# Patient Record
Sex: Female | Born: 1970
Health system: Southern US, Community
[De-identification: ages and names within clinical notes are randomized; demographics above are authoritative.]

## PROBLEM LIST (undated history)

## (undated) DIAGNOSIS — G709 Myoneural disorder, unspecified: Secondary | ICD-10-CM

## (undated) DIAGNOSIS — E559 Vitamin D deficiency, unspecified: Secondary | ICD-10-CM

## (undated) DIAGNOSIS — N938 Other specified abnormal uterine and vaginal bleeding: Secondary | ICD-10-CM

## (undated) DIAGNOSIS — M1990 Unspecified osteoarthritis, unspecified site: Secondary | ICD-10-CM

## (undated) DIAGNOSIS — Z95818 Presence of other cardiac implants and grafts: Secondary | ICD-10-CM

## (undated) DIAGNOSIS — N9089 Other specified noninflammatory disorders of vulva and perineum: Secondary | ICD-10-CM

## (undated) DIAGNOSIS — I639 Cerebral infarction, unspecified: Secondary | ICD-10-CM

## (undated) DIAGNOSIS — R87629 Unspecified abnormal cytological findings in specimens from vagina: Secondary | ICD-10-CM

## (undated) DIAGNOSIS — E669 Obesity, unspecified: Secondary | ICD-10-CM

## (undated) DIAGNOSIS — B999 Unspecified infectious disease: Secondary | ICD-10-CM

## (undated) DIAGNOSIS — R55 Syncope and collapse: Secondary | ICD-10-CM

## (undated) DIAGNOSIS — G4730 Sleep apnea, unspecified: Secondary | ICD-10-CM

## (undated) DIAGNOSIS — T8859XA Other complications of anesthesia, initial encounter: Secondary | ICD-10-CM

## (undated) DIAGNOSIS — N644 Mastodynia: Secondary | ICD-10-CM

## (undated) DIAGNOSIS — N930 Postcoital and contact bleeding: Secondary | ICD-10-CM

## (undated) DIAGNOSIS — E119 Type 2 diabetes mellitus without complications: Secondary | ICD-10-CM

## (undated) DIAGNOSIS — F329 Major depressive disorder, single episode, unspecified: Secondary | ICD-10-CM

## (undated) DIAGNOSIS — G629 Polyneuropathy, unspecified: Secondary | ICD-10-CM

## (undated) DIAGNOSIS — R51 Headache: Secondary | ICD-10-CM

## (undated) DIAGNOSIS — E079 Disorder of thyroid, unspecified: Secondary | ICD-10-CM

## (undated) DIAGNOSIS — R569 Unspecified convulsions: Secondary | ICD-10-CM

## (undated) DIAGNOSIS — N760 Acute vaginitis: Secondary | ICD-10-CM

## (undated) DIAGNOSIS — B9689 Other specified bacterial agents as the cause of diseases classified elsewhere: Secondary | ICD-10-CM

## (undated) DIAGNOSIS — I10 Essential (primary) hypertension: Secondary | ICD-10-CM

## (undated) DIAGNOSIS — D649 Anemia, unspecified: Secondary | ICD-10-CM

## (undated) DIAGNOSIS — Z862 Personal history of diseases of the blood and blood-forming organs and certain disorders involving the immune mechanism: Secondary | ICD-10-CM

## (undated) DIAGNOSIS — J029 Acute pharyngitis, unspecified: Secondary | ICD-10-CM

## (undated) DIAGNOSIS — N915 Oligomenorrhea, unspecified: Secondary | ICD-10-CM

## (undated) DIAGNOSIS — I2699 Other pulmonary embolism without acute cor pulmonale: Secondary | ICD-10-CM

## (undated) DIAGNOSIS — T4145XA Adverse effect of unspecified anesthetic, initial encounter: Secondary | ICD-10-CM

## (undated) DIAGNOSIS — B009 Herpesviral infection, unspecified: Secondary | ICD-10-CM

## (undated) DIAGNOSIS — I82409 Acute embolism and thrombosis of unspecified deep veins of unspecified lower extremity: Secondary | ICD-10-CM

## (undated) DIAGNOSIS — I872 Venous insufficiency (chronic) (peripheral): Secondary | ICD-10-CM

## (undated) DIAGNOSIS — E785 Hyperlipidemia, unspecified: Secondary | ICD-10-CM

## (undated) HISTORY — DX: Sleep apnea, unspecified: G47.30

## (undated) HISTORY — DX: Syncope and collapse: R55

## (undated) HISTORY — PX: CHOLECYSTECTOMY: SHX55

## (undated) HISTORY — DX: Acute vaginitis: N76.0

## (undated) HISTORY — DX: Obesity, unspecified: E66.9

## (undated) HISTORY — DX: Vitamin D deficiency, unspecified: E55.9

## (undated) HISTORY — DX: Major depressive disorder, single episode, unspecified: F32.9

## (undated) HISTORY — DX: Postcoital and contact bleeding: N93.0

## (undated) HISTORY — DX: Herpesviral infection, unspecified: B00.9

## (undated) HISTORY — DX: Polyneuropathy, unspecified: G62.9

## (undated) HISTORY — DX: Mastodynia: N64.4

## (undated) HISTORY — PX: DILATATION & CURRETTAGE/HYSTEROSCOPY WITH RESECTOCOPE: SHX5572

## (undated) HISTORY — DX: Type 2 diabetes mellitus without complications: E11.9

## (undated) HISTORY — DX: Other specified abnormal uterine and vaginal bleeding: N93.8

## (undated) HISTORY — DX: Oligomenorrhea, unspecified: N91.5

## (undated) HISTORY — DX: Other specified noninflammatory disorders of vulva and perineum: N90.89

## (undated) HISTORY — DX: Acute embolism and thrombosis of unspecified deep veins of unspecified lower extremity: I82.409

## (undated) HISTORY — DX: Other pulmonary embolism without acute cor pulmonale: I26.99

## (undated) HISTORY — DX: Disorder of thyroid, unspecified: E07.9

## (undated) HISTORY — PX: CARDIAC ELECTROPHYSIOLOGY STUDY & DFT: SHX1293

## (undated) HISTORY — DX: Other specified bacterial agents as the cause of diseases classified elsewhere: B96.89

## (undated) HISTORY — PX: WISDOM TOOTH EXTRACTION: SHX21

## (undated) HISTORY — DX: Venous insufficiency (chronic) (peripheral): I87.2

## (undated) HISTORY — PX: BREAST REDUCTION SURGERY: SHX8

---

## 1998-03-25 ENCOUNTER — Other Ambulatory Visit: Admission: RE | Admit: 1998-03-25 | Discharge: 1998-03-25 | Payer: Self-pay | Admitting: *Deleted

## 1998-03-25 ENCOUNTER — Encounter: Admission: RE | Admit: 1998-03-25 | Discharge: 1998-03-25 | Payer: Self-pay | Admitting: Obstetrics

## 1998-07-21 ENCOUNTER — Encounter: Admission: RE | Admit: 1998-07-21 | Discharge: 1998-07-21 | Payer: Self-pay | Admitting: Internal Medicine

## 2000-02-29 ENCOUNTER — Inpatient Hospital Stay (HOSPITAL_COMMUNITY): Admission: EM | Admit: 2000-02-29 | Discharge: 2000-02-29 | Payer: Self-pay | Admitting: Obstetrics

## 2000-05-17 ENCOUNTER — Encounter: Admission: RE | Admit: 2000-05-17 | Discharge: 2000-05-17 | Payer: Self-pay | Admitting: Obstetrics

## 2000-05-23 ENCOUNTER — Encounter: Admission: RE | Admit: 2000-05-23 | Discharge: 2000-05-23 | Payer: Self-pay | Admitting: Obstetrics

## 2000-05-23 ENCOUNTER — Encounter: Payer: Self-pay | Admitting: Obstetrics

## 2000-05-26 ENCOUNTER — Emergency Department (HOSPITAL_COMMUNITY): Admission: EM | Admit: 2000-05-26 | Discharge: 2000-05-26 | Payer: Self-pay | Admitting: Emergency Medicine

## 2000-05-28 ENCOUNTER — Emergency Department (HOSPITAL_COMMUNITY): Admission: EM | Admit: 2000-05-28 | Discharge: 2000-05-28 | Payer: Self-pay | Admitting: *Deleted

## 2000-06-14 ENCOUNTER — Encounter: Admission: RE | Admit: 2000-06-14 | Discharge: 2000-06-14 | Payer: Self-pay | Admitting: Obstetrics

## 2000-07-11 ENCOUNTER — Inpatient Hospital Stay (HOSPITAL_COMMUNITY): Admission: EM | Admit: 2000-07-11 | Discharge: 2000-07-11 | Payer: Self-pay | Admitting: Obstetrics

## 2000-11-08 ENCOUNTER — Encounter: Admission: RE | Admit: 2000-11-08 | Discharge: 2000-11-08 | Payer: Self-pay | Admitting: Obstetrics

## 2000-12-03 ENCOUNTER — Ambulatory Visit (HOSPITAL_COMMUNITY): Admission: RE | Admit: 2000-12-03 | Discharge: 2000-12-03 | Payer: Self-pay | Admitting: Obstetrics

## 2000-12-17 ENCOUNTER — Encounter (INDEPENDENT_AMBULATORY_CARE_PROVIDER_SITE_OTHER): Payer: Self-pay

## 2000-12-17 ENCOUNTER — Inpatient Hospital Stay (HOSPITAL_COMMUNITY): Admission: AD | Admit: 2000-12-17 | Discharge: 2000-12-17 | Payer: Self-pay | Admitting: Obstetrics

## 2001-01-10 ENCOUNTER — Encounter: Admission: RE | Admit: 2001-01-10 | Discharge: 2001-01-10 | Payer: Self-pay | Admitting: Obstetrics

## 2002-02-11 ENCOUNTER — Other Ambulatory Visit: Admission: RE | Admit: 2002-02-11 | Discharge: 2002-02-11 | Payer: Self-pay | Admitting: Obstetrics & Gynecology

## 2002-12-18 ENCOUNTER — Emergency Department (HOSPITAL_COMMUNITY): Admission: EM | Admit: 2002-12-18 | Discharge: 2002-12-18 | Payer: Self-pay | Admitting: Emergency Medicine

## 2003-08-19 ENCOUNTER — Emergency Department (HOSPITAL_COMMUNITY): Admission: EM | Admit: 2003-08-19 | Discharge: 2003-08-19 | Payer: Self-pay | Admitting: Emergency Medicine

## 2004-01-12 ENCOUNTER — Other Ambulatory Visit: Admission: RE | Admit: 2004-01-12 | Discharge: 2004-01-12 | Payer: Self-pay | Admitting: Obstetrics and Gynecology

## 2005-12-07 ENCOUNTER — Emergency Department (HOSPITAL_COMMUNITY): Admission: EM | Admit: 2005-12-07 | Discharge: 2005-12-08 | Payer: Self-pay | Admitting: Emergency Medicine

## 2005-12-15 ENCOUNTER — Emergency Department (HOSPITAL_COMMUNITY): Admission: EM | Admit: 2005-12-15 | Discharge: 2005-12-15 | Payer: Self-pay | Admitting: Emergency Medicine

## 2005-12-21 ENCOUNTER — Emergency Department (HOSPITAL_COMMUNITY): Admission: EM | Admit: 2005-12-21 | Discharge: 2005-12-21 | Payer: Self-pay | Admitting: Emergency Medicine

## 2007-12-12 DIAGNOSIS — Z9151 Personal history of suicidal behavior: Secondary | ICD-10-CM | POA: Insufficient documentation

## 2007-12-27 ENCOUNTER — Encounter (INDEPENDENT_AMBULATORY_CARE_PROVIDER_SITE_OTHER): Payer: Self-pay | Admitting: Hospitalist

## 2007-12-27 ENCOUNTER — Ambulatory Visit: Payer: Self-pay | Admitting: Cardiology

## 2007-12-27 ENCOUNTER — Inpatient Hospital Stay (HOSPITAL_COMMUNITY): Admission: EM | Admit: 2007-12-27 | Discharge: 2008-01-04 | Payer: Self-pay | Admitting: Emergency Medicine

## 2007-12-27 ENCOUNTER — Ambulatory Visit: Payer: Self-pay | Admitting: Hospitalist

## 2007-12-30 ENCOUNTER — Encounter (INDEPENDENT_AMBULATORY_CARE_PROVIDER_SITE_OTHER): Payer: Self-pay | Admitting: Hospitalist

## 2007-12-30 ENCOUNTER — Ambulatory Visit: Payer: Self-pay | Admitting: Vascular Surgery

## 2008-01-01 ENCOUNTER — Encounter: Payer: Self-pay | Admitting: Internal Medicine

## 2008-01-02 ENCOUNTER — Encounter (INDEPENDENT_AMBULATORY_CARE_PROVIDER_SITE_OTHER): Payer: Self-pay | Admitting: Hospitalist

## 2008-01-09 ENCOUNTER — Ambulatory Visit: Payer: Self-pay | Admitting: Internal Medicine

## 2008-01-13 ENCOUNTER — Ambulatory Visit: Payer: Self-pay | Admitting: Hospitalist

## 2008-01-13 LAB — CONVERTED CEMR LAB: INR: 2.7

## 2008-01-16 ENCOUNTER — Ambulatory Visit (HOSPITAL_COMMUNITY): Admission: RE | Admit: 2008-01-16 | Discharge: 2008-01-16 | Payer: Self-pay | Admitting: Hospitalist

## 2008-01-16 ENCOUNTER — Encounter: Payer: Self-pay | Admitting: Internal Medicine

## 2008-01-16 ENCOUNTER — Ambulatory Visit: Payer: Self-pay | Admitting: Hospitalist

## 2008-01-20 ENCOUNTER — Ambulatory Visit: Payer: Self-pay | Admitting: Hospitalist

## 2008-01-20 LAB — CONVERTED CEMR LAB: INR: 3.8

## 2008-01-22 ENCOUNTER — Encounter (INDEPENDENT_AMBULATORY_CARE_PROVIDER_SITE_OTHER): Payer: Self-pay | Admitting: Hospitalist

## 2008-01-22 ENCOUNTER — Ambulatory Visit (HOSPITAL_COMMUNITY): Admission: RE | Admit: 2008-01-22 | Discharge: 2008-01-22 | Payer: Self-pay | Admitting: Hospitalist

## 2008-01-22 ENCOUNTER — Ambulatory Visit: Payer: Self-pay | Admitting: Vascular Surgery

## 2008-01-22 ENCOUNTER — Ambulatory Visit: Payer: Self-pay | Admitting: Hospitalist

## 2008-01-22 DIAGNOSIS — J45909 Unspecified asthma, uncomplicated: Secondary | ICD-10-CM | POA: Insufficient documentation

## 2008-01-22 DIAGNOSIS — G629 Polyneuropathy, unspecified: Secondary | ICD-10-CM | POA: Insufficient documentation

## 2008-01-22 DIAGNOSIS — G589 Mononeuropathy, unspecified: Secondary | ICD-10-CM | POA: Insufficient documentation

## 2008-01-23 ENCOUNTER — Telehealth: Payer: Self-pay | Admitting: Internal Medicine

## 2008-01-24 ENCOUNTER — Encounter: Payer: Self-pay | Admitting: Internal Medicine

## 2008-01-27 ENCOUNTER — Encounter: Payer: Self-pay | Admitting: Internal Medicine

## 2008-01-28 ENCOUNTER — Telehealth (INDEPENDENT_AMBULATORY_CARE_PROVIDER_SITE_OTHER): Payer: Self-pay | Admitting: Pharmacy Technician

## 2008-01-29 ENCOUNTER — Observation Stay (HOSPITAL_COMMUNITY): Admission: EM | Admit: 2008-01-29 | Discharge: 2008-01-31 | Payer: Self-pay | Admitting: Emergency Medicine

## 2008-01-29 ENCOUNTER — Ambulatory Visit: Payer: Self-pay | Admitting: Infectious Diseases

## 2008-01-30 ENCOUNTER — Encounter: Payer: Self-pay | Admitting: Infectious Diseases

## 2008-01-31 ENCOUNTER — Encounter (INDEPENDENT_AMBULATORY_CARE_PROVIDER_SITE_OTHER): Payer: Self-pay | Admitting: *Deleted

## 2008-02-03 ENCOUNTER — Ambulatory Visit: Payer: Self-pay | Admitting: Internal Medicine

## 2008-02-05 ENCOUNTER — Ambulatory Visit: Payer: Self-pay | Admitting: Internal Medicine

## 2008-02-05 ENCOUNTER — Telehealth (INDEPENDENT_AMBULATORY_CARE_PROVIDER_SITE_OTHER): Payer: Self-pay | Admitting: Pharmacy Technician

## 2008-02-05 LAB — CONVERTED CEMR LAB: Blood Glucose, Fingerstick: 88

## 2008-02-07 ENCOUNTER — Emergency Department (HOSPITAL_COMMUNITY): Admission: EM | Admit: 2008-02-07 | Discharge: 2008-02-07 | Payer: Self-pay | Admitting: Emergency Medicine

## 2008-02-12 ENCOUNTER — Telehealth (INDEPENDENT_AMBULATORY_CARE_PROVIDER_SITE_OTHER): Payer: Self-pay | Admitting: *Deleted

## 2008-02-13 ENCOUNTER — Ambulatory Visit: Payer: Self-pay | Admitting: *Deleted

## 2008-02-13 ENCOUNTER — Ambulatory Visit: Payer: Self-pay | Admitting: Emergency Medicine

## 2008-02-13 ENCOUNTER — Encounter (INDEPENDENT_AMBULATORY_CARE_PROVIDER_SITE_OTHER): Payer: Self-pay | Admitting: Internal Medicine

## 2008-02-14 LAB — CONVERTED CEMR LAB
Bilirubin, Direct: 0.1 mg/dL (ref 0.0–0.3)
INR: 1.7 — ABNORMAL HIGH (ref 0.0–1.5)
Indirect Bilirubin: 0.4 mg/dL (ref 0.0–0.9)
Total Protein: 7 g/dL (ref 6.0–8.3)
aPTT: 34 s (ref 24–37)

## 2008-02-21 ENCOUNTER — Telehealth (INDEPENDENT_AMBULATORY_CARE_PROVIDER_SITE_OTHER): Payer: Self-pay | Admitting: Pharmacy Technician

## 2008-02-22 ENCOUNTER — Emergency Department (HOSPITAL_COMMUNITY): Admission: EM | Admit: 2008-02-22 | Discharge: 2008-02-22 | Payer: Self-pay | Admitting: Emergency Medicine

## 2008-02-24 ENCOUNTER — Ambulatory Visit: Payer: Self-pay | Admitting: Hospitalist

## 2008-02-24 ENCOUNTER — Encounter (INDEPENDENT_AMBULATORY_CARE_PROVIDER_SITE_OTHER): Payer: Self-pay | Admitting: Internal Medicine

## 2008-02-24 LAB — CONVERTED CEMR LAB: INR: 1

## 2008-02-28 ENCOUNTER — Telehealth (INDEPENDENT_AMBULATORY_CARE_PROVIDER_SITE_OTHER): Payer: Self-pay | Admitting: *Deleted

## 2008-02-28 LAB — CONVERTED CEMR LAB: HEP B PCR: <100 (ref <100)

## 2008-03-02 ENCOUNTER — Ambulatory Visit: Payer: Self-pay | Admitting: *Deleted

## 2008-03-06 ENCOUNTER — Ambulatory Visit: Payer: Self-pay | Admitting: Hospitalist

## 2008-03-16 ENCOUNTER — Ambulatory Visit: Payer: Self-pay | Admitting: Internal Medicine

## 2008-03-16 LAB — CONVERTED CEMR LAB

## 2008-03-25 ENCOUNTER — Telehealth: Payer: Self-pay | Admitting: Internal Medicine

## 2008-03-27 ENCOUNTER — Ambulatory Visit: Payer: Self-pay | Admitting: Emergency Medicine

## 2008-03-27 DIAGNOSIS — G4710 Hypersomnia, unspecified: Secondary | ICD-10-CM | POA: Insufficient documentation

## 2008-03-27 DIAGNOSIS — G4730 Sleep apnea, unspecified: Secondary | ICD-10-CM

## 2008-03-31 ENCOUNTER — Encounter: Payer: Self-pay | Admitting: Pulmonary Disease

## 2008-03-31 ENCOUNTER — Ambulatory Visit (HOSPITAL_BASED_OUTPATIENT_CLINIC_OR_DEPARTMENT_OTHER): Admission: RE | Admit: 2008-03-31 | Discharge: 2008-03-31 | Payer: Self-pay | Admitting: Emergency Medicine

## 2008-03-31 ENCOUNTER — Ambulatory Visit: Payer: Self-pay | Admitting: Pulmonary Disease

## 2008-04-01 ENCOUNTER — Ambulatory Visit: Payer: Self-pay | Admitting: Hospitalist

## 2008-04-06 ENCOUNTER — Encounter: Payer: Self-pay | Admitting: Pharmacist

## 2008-04-06 ENCOUNTER — Ambulatory Visit: Payer: Self-pay | Admitting: Hospitalist

## 2008-04-06 LAB — CONVERTED CEMR LAB: INR: 1.3

## 2008-04-20 ENCOUNTER — Ambulatory Visit: Payer: Self-pay | Admitting: Internal Medicine

## 2008-04-20 LAB — CONVERTED CEMR LAB: INR: 5.5

## 2008-04-21 ENCOUNTER — Encounter: Payer: Self-pay | Admitting: Emergency Medicine

## 2008-04-23 ENCOUNTER — Emergency Department (HOSPITAL_COMMUNITY): Admission: EM | Admit: 2008-04-23 | Discharge: 2008-04-24 | Payer: Self-pay | Admitting: Emergency Medicine

## 2008-04-24 ENCOUNTER — Telehealth (INDEPENDENT_AMBULATORY_CARE_PROVIDER_SITE_OTHER): Payer: Self-pay | Admitting: *Deleted

## 2008-05-07 ENCOUNTER — Ambulatory Visit: Payer: Self-pay | Admitting: Emergency Medicine

## 2008-05-07 ENCOUNTER — Ambulatory Visit: Payer: Self-pay | Admitting: Internal Medicine

## 2008-05-07 DIAGNOSIS — R071 Chest pain on breathing: Secondary | ICD-10-CM | POA: Insufficient documentation

## 2008-05-07 LAB — CONVERTED CEMR LAB
Blood Glucose, Fingerstick: 124
Hgb A1c MFr Bld: 6.2 %

## 2008-05-11 ENCOUNTER — Ambulatory Visit: Payer: Self-pay | Admitting: Internal Medicine

## 2008-05-20 ENCOUNTER — Telehealth: Payer: Self-pay | Admitting: Emergency Medicine

## 2008-06-01 ENCOUNTER — Ambulatory Visit: Payer: Self-pay | Admitting: Internal Medicine

## 2008-06-01 ENCOUNTER — Encounter: Payer: Self-pay | Admitting: Internal Medicine

## 2008-06-01 LAB — CONVERTED CEMR LAB

## 2008-06-05 ENCOUNTER — Emergency Department (HOSPITAL_COMMUNITY): Admission: EM | Admit: 2008-06-05 | Discharge: 2008-06-05 | Payer: Self-pay | Admitting: Emergency Medicine

## 2008-06-09 ENCOUNTER — Telehealth: Payer: Self-pay | Admitting: Internal Medicine

## 2008-06-11 ENCOUNTER — Ambulatory Visit: Payer: Self-pay | Admitting: Infectious Diseases

## 2008-06-11 DIAGNOSIS — IMO0001 Reserved for inherently not codable concepts without codable children: Secondary | ICD-10-CM | POA: Insufficient documentation

## 2008-06-11 DIAGNOSIS — E118 Type 2 diabetes mellitus with unspecified complications: Secondary | ICD-10-CM

## 2008-06-11 DIAGNOSIS — Z86718 Personal history of other venous thrombosis and embolism: Secondary | ICD-10-CM | POA: Insufficient documentation

## 2008-06-11 DIAGNOSIS — Z794 Long term (current) use of insulin: Secondary | ICD-10-CM

## 2008-06-12 ENCOUNTER — Encounter: Payer: Self-pay | Admitting: Internal Medicine

## 2008-06-12 LAB — CONVERTED CEMR LAB: Free T4: 0.95 ng/dL (ref 0.89–1.80)

## 2008-06-22 ENCOUNTER — Ambulatory Visit: Payer: Self-pay | Admitting: Infectious Diseases

## 2008-06-22 LAB — CONVERTED CEMR LAB: INR: 1.1

## 2008-06-24 ENCOUNTER — Emergency Department (HOSPITAL_COMMUNITY): Admission: EM | Admit: 2008-06-24 | Discharge: 2008-06-24 | Payer: Self-pay | Admitting: Emergency Medicine

## 2008-06-26 ENCOUNTER — Encounter: Payer: Self-pay | Admitting: Internal Medicine

## 2008-06-26 ENCOUNTER — Ambulatory Visit: Payer: Self-pay | Admitting: Infectious Diseases

## 2008-06-26 LAB — CONVERTED CEMR LAB: Blood Glucose, Fingerstick: 191

## 2008-07-06 ENCOUNTER — Ambulatory Visit: Payer: Self-pay | Admitting: Internal Medicine

## 2008-07-06 LAB — CONVERTED CEMR LAB

## 2008-07-07 ENCOUNTER — Ambulatory Visit: Payer: Self-pay | Admitting: Internal Medicine

## 2008-07-07 ENCOUNTER — Ambulatory Visit (HOSPITAL_COMMUNITY): Admission: RE | Admit: 2008-07-07 | Discharge: 2008-07-07 | Payer: Self-pay | Admitting: Internal Medicine

## 2008-07-08 DIAGNOSIS — I10 Essential (primary) hypertension: Secondary | ICD-10-CM | POA: Insufficient documentation

## 2008-07-09 ENCOUNTER — Ambulatory Visit: Payer: Self-pay | Admitting: Internal Medicine

## 2008-07-09 ENCOUNTER — Encounter: Payer: Self-pay | Admitting: Internal Medicine

## 2008-07-10 DIAGNOSIS — D649 Anemia, unspecified: Secondary | ICD-10-CM | POA: Insufficient documentation

## 2008-07-10 LAB — CONVERTED CEMR LAB
ALT: 26 units/L (ref 0–35)
Alkaline Phosphatase: 54 units/L (ref 39–117)
Basophils Absolute: 0 10*3/uL (ref 0.0–0.1)
Creatinine, Ser: 0.76 mg/dL (ref 0.40–1.20)
Eosinophils Absolute: 0.1 10*3/uL (ref 0.0–0.7)
Eosinophils Relative: 1 % (ref 0–5)
HCT: 36.7 % (ref 36.0–46.0)
Hemoglobin: 11.7 g/dL — ABNORMAL LOW (ref 12.0–15.0)
MCHC: 31.9 g/dL (ref 30.0–36.0)
MCV: 76.1 fL — ABNORMAL LOW (ref 78.0–100.0)
Monocytes Absolute: 0.6 10*3/uL (ref 0.1–1.0)
Platelets: 286 10*3/uL (ref 150–400)
RDW: 18.2 % — ABNORMAL HIGH (ref 11.5–15.5)
Sodium: 137 meq/L (ref 135–145)
Total Bilirubin: 0.2 mg/dL — ABNORMAL LOW (ref 0.3–1.2)
Total Protein: 7 g/dL (ref 6.0–8.3)

## 2008-07-20 ENCOUNTER — Ambulatory Visit: Payer: Self-pay | Admitting: Internal Medicine

## 2008-07-20 LAB — CONVERTED CEMR LAB: INR: 2.9

## 2008-07-23 ENCOUNTER — Inpatient Hospital Stay (HOSPITAL_COMMUNITY): Admission: RE | Admit: 2008-07-23 | Discharge: 2008-07-27 | Payer: Self-pay | Admitting: Psychiatry

## 2008-07-23 ENCOUNTER — Emergency Department (HOSPITAL_COMMUNITY): Admission: EM | Admit: 2008-07-23 | Discharge: 2008-07-23 | Payer: Self-pay | Admitting: Emergency Medicine

## 2008-07-23 ENCOUNTER — Ambulatory Visit: Payer: Self-pay | Admitting: Psychiatry

## 2008-08-03 ENCOUNTER — Ambulatory Visit: Payer: Self-pay | Admitting: Internal Medicine

## 2008-08-03 LAB — CONVERTED CEMR LAB: Hgb A1c MFr Bld: 6.1 %

## 2008-08-10 ENCOUNTER — Ambulatory Visit: Payer: Self-pay | Admitting: Internal Medicine

## 2008-08-10 LAB — CONVERTED CEMR LAB: INR: 1.6

## 2008-08-11 ENCOUNTER — Encounter: Payer: Self-pay | Admitting: Internal Medicine

## 2008-08-31 ENCOUNTER — Ambulatory Visit: Payer: Self-pay | Admitting: Internal Medicine

## 2008-08-31 LAB — CONVERTED CEMR LAB: INR: 7.8

## 2008-09-01 ENCOUNTER — Encounter: Payer: Self-pay | Admitting: Internal Medicine

## 2008-09-03 ENCOUNTER — Observation Stay (HOSPITAL_COMMUNITY): Admission: EM | Admit: 2008-09-03 | Discharge: 2008-09-03 | Payer: Self-pay | Admitting: Emergency Medicine

## 2008-09-03 ENCOUNTER — Ambulatory Visit: Payer: Self-pay | Admitting: Internal Medicine

## 2008-09-03 ENCOUNTER — Telehealth: Payer: Self-pay | Admitting: Internal Medicine

## 2008-09-04 ENCOUNTER — Ambulatory Visit: Payer: Self-pay | Admitting: Internal Medicine

## 2008-09-04 LAB — CONVERTED CEMR LAB

## 2008-09-07 ENCOUNTER — Ambulatory Visit: Payer: Self-pay | Admitting: Internal Medicine

## 2008-09-07 LAB — CONVERTED CEMR LAB: INR: 1.4

## 2008-09-16 ENCOUNTER — Telehealth: Payer: Self-pay | Admitting: *Deleted

## 2008-09-16 ENCOUNTER — Emergency Department (HOSPITAL_COMMUNITY): Admission: EM | Admit: 2008-09-16 | Discharge: 2008-09-16 | Payer: Self-pay | Admitting: Emergency Medicine

## 2008-09-18 ENCOUNTER — Ambulatory Visit: Payer: Self-pay | Admitting: Infectious Disease

## 2008-09-18 DIAGNOSIS — F3289 Other specified depressive episodes: Secondary | ICD-10-CM | POA: Insufficient documentation

## 2008-09-18 DIAGNOSIS — F329 Major depressive disorder, single episode, unspecified: Secondary | ICD-10-CM | POA: Insufficient documentation

## 2008-09-18 LAB — CONVERTED CEMR LAB: Blood Glucose, Fingerstick: 119

## 2008-09-21 ENCOUNTER — Ambulatory Visit: Payer: Self-pay | Admitting: Internal Medicine

## 2008-09-21 LAB — CONVERTED CEMR LAB: INR: 2.7

## 2008-10-07 ENCOUNTER — Encounter: Payer: Self-pay | Admitting: Internal Medicine

## 2008-10-07 ENCOUNTER — Ambulatory Visit: Payer: Self-pay | Admitting: Internal Medicine

## 2008-10-07 LAB — CONVERTED CEMR LAB
Blood Glucose, Fingerstick: 91
Hemoglobin, Urine: NEGATIVE
Ketones, ur: NEGATIVE mg/dL
Leukocytes, UA: NEGATIVE
Nitrite: NEGATIVE
Urobilinogen, UA: 0.2 (ref 0.0–1.0)
pH: 5.5 (ref 5.0–8.0)

## 2008-10-12 ENCOUNTER — Ambulatory Visit: Payer: Self-pay | Admitting: Infectious Diseases

## 2008-10-13 ENCOUNTER — Ambulatory Visit: Payer: Self-pay | Admitting: Internal Medicine

## 2008-10-13 ENCOUNTER — Inpatient Hospital Stay (HOSPITAL_COMMUNITY): Admission: EM | Admit: 2008-10-13 | Discharge: 2008-10-15 | Payer: Self-pay | Admitting: Emergency Medicine

## 2008-10-13 ENCOUNTER — Encounter: Payer: Self-pay | Admitting: Internal Medicine

## 2008-10-14 ENCOUNTER — Encounter (INDEPENDENT_AMBULATORY_CARE_PROVIDER_SITE_OTHER): Payer: Self-pay | Admitting: Internal Medicine

## 2008-10-14 ENCOUNTER — Ambulatory Visit: Payer: Self-pay | Admitting: Surgery

## 2008-10-16 ENCOUNTER — Emergency Department (HOSPITAL_COMMUNITY): Admission: EM | Admit: 2008-10-16 | Discharge: 2008-10-16 | Payer: Self-pay | Admitting: Emergency Medicine

## 2008-10-19 ENCOUNTER — Ambulatory Visit: Payer: Self-pay | Admitting: Internal Medicine

## 2008-10-19 LAB — CONVERTED CEMR LAB

## 2008-10-24 ENCOUNTER — Emergency Department (HOSPITAL_COMMUNITY): Admission: EM | Admit: 2008-10-24 | Discharge: 2008-10-24 | Payer: Self-pay | Admitting: Emergency Medicine

## 2008-10-26 ENCOUNTER — Ambulatory Visit: Payer: Self-pay | Admitting: *Deleted

## 2008-10-26 LAB — CONVERTED CEMR LAB: INR: 3

## 2008-11-02 ENCOUNTER — Ambulatory Visit: Payer: Self-pay | Admitting: Internal Medicine

## 2008-11-02 ENCOUNTER — Encounter (INDEPENDENT_AMBULATORY_CARE_PROVIDER_SITE_OTHER): Payer: Self-pay | Admitting: Internal Medicine

## 2008-11-02 ENCOUNTER — Ambulatory Visit: Payer: Self-pay | Admitting: *Deleted

## 2008-11-02 ENCOUNTER — Encounter: Payer: Self-pay | Admitting: Pharmacist

## 2008-11-02 ENCOUNTER — Emergency Department (HOSPITAL_COMMUNITY): Admission: EM | Admit: 2008-11-02 | Discharge: 2008-11-02 | Payer: Self-pay | Admitting: Emergency Medicine

## 2008-11-02 LAB — CONVERTED CEMR LAB
Blood Glucose, Fingerstick: 58
Hgb A1c MFr Bld: 6 %
INR: 2.1

## 2008-11-16 ENCOUNTER — Ambulatory Visit: Payer: Self-pay | Admitting: Internal Medicine

## 2008-11-16 LAB — CONVERTED CEMR LAB

## 2008-11-26 ENCOUNTER — Ambulatory Visit: Payer: Self-pay | Admitting: Cardiology

## 2008-11-26 ENCOUNTER — Ambulatory Visit: Payer: Self-pay | Admitting: Internal Medicine

## 2008-11-26 ENCOUNTER — Inpatient Hospital Stay (HOSPITAL_COMMUNITY): Admission: RE | Admit: 2008-11-26 | Discharge: 2008-11-30 | Payer: Self-pay | Admitting: Internal Medicine

## 2008-11-27 ENCOUNTER — Encounter (INDEPENDENT_AMBULATORY_CARE_PROVIDER_SITE_OTHER): Payer: Self-pay | Admitting: Internal Medicine

## 2008-12-01 ENCOUNTER — Encounter: Payer: Self-pay | Admitting: Internal Medicine

## 2008-12-07 ENCOUNTER — Ambulatory Visit: Payer: Self-pay | Admitting: Infectious Diseases

## 2008-12-10 ENCOUNTER — Encounter (INDEPENDENT_AMBULATORY_CARE_PROVIDER_SITE_OTHER): Payer: Self-pay | Admitting: *Deleted

## 2008-12-21 ENCOUNTER — Encounter: Payer: Self-pay | Admitting: Internal Medicine

## 2008-12-28 ENCOUNTER — Ambulatory Visit: Payer: Self-pay | Admitting: Internal Medicine

## 2008-12-28 ENCOUNTER — Encounter: Payer: Self-pay | Admitting: Internal Medicine

## 2008-12-28 LAB — CONVERTED CEMR LAB: Blood Glucose, Fingerstick: 169

## 2008-12-30 ENCOUNTER — Emergency Department (HOSPITAL_COMMUNITY): Admission: EM | Admit: 2008-12-30 | Discharge: 2008-12-31 | Payer: Self-pay | Admitting: Emergency Medicine

## 2009-01-07 ENCOUNTER — Ambulatory Visit: Payer: Self-pay | Admitting: Internal Medicine

## 2009-01-13 ENCOUNTER — Encounter: Payer: Self-pay | Admitting: Internal Medicine

## 2009-01-18 ENCOUNTER — Ambulatory Visit: Payer: Self-pay

## 2009-01-18 ENCOUNTER — Encounter: Payer: Self-pay | Admitting: Internal Medicine

## 2009-01-25 ENCOUNTER — Ambulatory Visit: Payer: Self-pay | Admitting: Internal Medicine

## 2009-01-25 LAB — CONVERTED CEMR LAB: INR: 1.1

## 2009-01-29 ENCOUNTER — Telehealth: Payer: Self-pay | Admitting: Internal Medicine

## 2009-01-29 ENCOUNTER — Ambulatory Visit: Payer: Self-pay | Admitting: *Deleted

## 2009-02-02 ENCOUNTER — Emergency Department (HOSPITAL_COMMUNITY): Admission: EM | Admit: 2009-02-02 | Discharge: 2009-02-02 | Payer: Self-pay | Admitting: Emergency Medicine

## 2009-02-04 ENCOUNTER — Telehealth: Payer: Self-pay | Admitting: Internal Medicine

## 2009-02-08 ENCOUNTER — Telehealth: Payer: Self-pay | Admitting: Internal Medicine

## 2009-02-08 ENCOUNTER — Encounter: Payer: Self-pay | Admitting: Internal Medicine

## 2009-02-12 ENCOUNTER — Emergency Department (HOSPITAL_COMMUNITY): Admission: EM | Admit: 2009-02-12 | Discharge: 2009-02-12 | Payer: Self-pay | Admitting: Emergency Medicine

## 2009-02-15 ENCOUNTER — Ambulatory Visit: Payer: Self-pay | Admitting: *Deleted

## 2009-02-15 ENCOUNTER — Encounter: Payer: Self-pay | Admitting: Internal Medicine

## 2009-02-15 ENCOUNTER — Ambulatory Visit: Payer: Self-pay | Admitting: Internal Medicine

## 2009-02-15 ENCOUNTER — Emergency Department (HOSPITAL_COMMUNITY): Admission: EM | Admit: 2009-02-15 | Discharge: 2009-02-15 | Payer: Self-pay | Admitting: Emergency Medicine

## 2009-02-24 ENCOUNTER — Telehealth: Payer: Self-pay | Admitting: *Deleted

## 2009-03-01 ENCOUNTER — Encounter: Payer: Self-pay | Admitting: Internal Medicine

## 2009-03-01 ENCOUNTER — Ambulatory Visit: Payer: Self-pay | Admitting: Internal Medicine

## 2009-03-03 ENCOUNTER — Encounter: Payer: Self-pay | Admitting: Pulmonary Disease

## 2009-03-07 ENCOUNTER — Emergency Department (HOSPITAL_COMMUNITY): Admission: EM | Admit: 2009-03-07 | Discharge: 2009-03-07 | Payer: Self-pay | Admitting: Emergency Medicine

## 2009-03-11 ENCOUNTER — Telehealth: Payer: Self-pay | Admitting: Internal Medicine

## 2009-03-18 ENCOUNTER — Encounter: Admission: RE | Admit: 2009-03-18 | Discharge: 2009-03-18 | Payer: Self-pay | Admitting: Neurology

## 2009-03-29 ENCOUNTER — Encounter: Payer: Self-pay | Admitting: Internal Medicine

## 2009-03-29 ENCOUNTER — Ambulatory Visit: Payer: Self-pay | Admitting: Internal Medicine

## 2009-04-20 ENCOUNTER — Ambulatory Visit: Payer: Self-pay | Admitting: *Deleted

## 2009-04-20 DIAGNOSIS — M629 Disorder of muscle, unspecified: Secondary | ICD-10-CM | POA: Insufficient documentation

## 2009-04-20 LAB — CONVERTED CEMR LAB: Hgb A1c MFr Bld: 6.7 %

## 2009-04-21 ENCOUNTER — Encounter: Payer: Self-pay | Admitting: Pulmonary Disease

## 2009-04-24 ENCOUNTER — Emergency Department (HOSPITAL_COMMUNITY): Admission: EM | Admit: 2009-04-24 | Discharge: 2009-04-24 | Payer: Self-pay | Admitting: Emergency Medicine

## 2009-05-05 ENCOUNTER — Encounter: Admission: RE | Admit: 2009-05-05 | Discharge: 2009-05-17 | Payer: Self-pay | Admitting: Internal Medicine

## 2009-05-06 DIAGNOSIS — B369 Superficial mycosis, unspecified: Secondary | ICD-10-CM | POA: Insufficient documentation

## 2009-05-06 DIAGNOSIS — N644 Mastodynia: Secondary | ICD-10-CM | POA: Insufficient documentation

## 2009-05-31 ENCOUNTER — Encounter: Payer: Self-pay | Admitting: Internal Medicine

## 2009-06-08 ENCOUNTER — Telehealth: Payer: Self-pay | Admitting: Internal Medicine

## 2009-06-08 ENCOUNTER — Emergency Department (HOSPITAL_COMMUNITY): Admission: EM | Admit: 2009-06-08 | Discharge: 2009-06-08 | Payer: Self-pay | Admitting: Emergency Medicine

## 2009-06-16 ENCOUNTER — Encounter: Payer: Self-pay | Admitting: Internal Medicine

## 2009-07-09 ENCOUNTER — Encounter: Payer: Self-pay | Admitting: Internal Medicine

## 2009-07-15 ENCOUNTER — Encounter: Payer: Self-pay | Admitting: Internal Medicine

## 2009-07-19 ENCOUNTER — Encounter: Payer: Self-pay | Admitting: Internal Medicine

## 2009-07-19 DIAGNOSIS — G4730 Sleep apnea, unspecified: Secondary | ICD-10-CM | POA: Insufficient documentation

## 2009-07-19 DIAGNOSIS — E669 Obesity, unspecified: Secondary | ICD-10-CM | POA: Insufficient documentation

## 2009-07-21 DIAGNOSIS — E559 Vitamin D deficiency, unspecified: Secondary | ICD-10-CM | POA: Insufficient documentation

## 2009-08-09 ENCOUNTER — Emergency Department (HOSPITAL_COMMUNITY): Admission: EM | Admit: 2009-08-09 | Discharge: 2009-08-09 | Payer: Self-pay | Admitting: Emergency Medicine

## 2009-08-11 DIAGNOSIS — J309 Allergic rhinitis, unspecified: Secondary | ICD-10-CM | POA: Insufficient documentation

## 2009-08-25 ENCOUNTER — Encounter: Payer: Self-pay | Admitting: Internal Medicine

## 2009-08-26 ENCOUNTER — Emergency Department (HOSPITAL_COMMUNITY): Admission: EM | Admit: 2009-08-26 | Discharge: 2009-08-26 | Payer: Self-pay | Admitting: Emergency Medicine

## 2009-08-29 ENCOUNTER — Emergency Department (HOSPITAL_COMMUNITY): Admission: EM | Admit: 2009-08-29 | Discharge: 2009-08-29 | Payer: Self-pay | Admitting: Emergency Medicine

## 2009-08-30 ENCOUNTER — Encounter: Payer: Self-pay | Admitting: Internal Medicine

## 2009-08-31 ENCOUNTER — Ambulatory Visit: Payer: Self-pay | Admitting: Pulmonary Disease

## 2009-09-09 ENCOUNTER — Encounter (INDEPENDENT_AMBULATORY_CARE_PROVIDER_SITE_OTHER): Payer: Self-pay | Admitting: *Deleted

## 2009-09-24 ENCOUNTER — Telehealth: Payer: Self-pay | Admitting: Internal Medicine

## 2009-09-30 ENCOUNTER — Telehealth (INDEPENDENT_AMBULATORY_CARE_PROVIDER_SITE_OTHER): Payer: Self-pay | Admitting: *Deleted

## 2009-10-05 ENCOUNTER — Ambulatory Visit: Payer: Self-pay | Admitting: Internal Medicine

## 2009-10-09 ENCOUNTER — Telehealth: Payer: Self-pay | Admitting: Internal Medicine

## 2009-10-11 ENCOUNTER — Encounter: Payer: Self-pay | Admitting: Internal Medicine

## 2009-10-18 ENCOUNTER — Ambulatory Visit: Payer: Self-pay | Admitting: Internal Medicine

## 2009-10-18 DIAGNOSIS — R55 Syncope and collapse: Secondary | ICD-10-CM | POA: Insufficient documentation

## 2009-10-18 LAB — CONVERTED CEMR LAB
BUN: 5 mg/dL — ABNORMAL LOW (ref 6–23)
Basophils Absolute: 0 10*3/uL (ref 0.0–0.1)
Basophils Relative: 0.5 % (ref 0.0–3.0)
CO2: 30 meq/L (ref 19–32)
Calcium: 8.9 mg/dL (ref 8.4–10.5)
Chloride: 101 meq/L (ref 96–112)
Creatinine, Ser: 0.8 mg/dL (ref 0.4–1.2)
Eosinophils Absolute: 0.1 10*3/uL (ref 0.0–0.7)
Glucose, Bld: 214 mg/dL — ABNORMAL HIGH (ref 70–99)
INR: 1.1 — ABNORMAL HIGH (ref 0.8–1.0)
Lymphocytes Relative: 42.5 % (ref 12.0–46.0)
MCHC: 32.9 g/dL (ref 30.0–36.0)
MCV: 82.3 fL (ref 78.0–100.0)
Monocytes Absolute: 0.5 10*3/uL (ref 0.1–1.0)
Neutrophils Relative %: 49.1 % (ref 43.0–77.0)
Platelets: 283 10*3/uL (ref 150.0–400.0)
RDW: 14.5 % (ref 11.5–14.6)

## 2009-10-19 ENCOUNTER — Ambulatory Visit (HOSPITAL_COMMUNITY): Admission: RE | Admit: 2009-10-19 | Discharge: 2009-10-19 | Payer: Self-pay | Admitting: Internal Medicine

## 2009-10-19 ENCOUNTER — Ambulatory Visit: Payer: Self-pay | Admitting: Internal Medicine

## 2009-10-20 ENCOUNTER — Telehealth: Payer: Self-pay | Admitting: Internal Medicine

## 2009-10-20 ENCOUNTER — Ambulatory Visit: Payer: Self-pay | Admitting: Internal Medicine

## 2009-10-25 ENCOUNTER — Encounter: Payer: Self-pay | Admitting: Internal Medicine

## 2009-10-27 ENCOUNTER — Telehealth: Payer: Self-pay | Admitting: *Deleted

## 2009-11-15 ENCOUNTER — Ambulatory Visit: Payer: Self-pay

## 2009-11-15 ENCOUNTER — Ambulatory Visit: Payer: Self-pay | Admitting: Internal Medicine

## 2009-11-15 ENCOUNTER — Observation Stay (HOSPITAL_COMMUNITY): Admission: EM | Admit: 2009-11-15 | Discharge: 2009-11-17 | Payer: Self-pay | Admitting: Emergency Medicine

## 2009-11-15 ENCOUNTER — Encounter: Payer: Self-pay | Admitting: Internal Medicine

## 2009-11-17 ENCOUNTER — Encounter: Payer: Self-pay | Admitting: Internal Medicine

## 2009-12-11 DIAGNOSIS — N915 Oligomenorrhea, unspecified: Secondary | ICD-10-CM

## 2009-12-11 DIAGNOSIS — I82409 Acute embolism and thrombosis of unspecified deep veins of unspecified lower extremity: Secondary | ICD-10-CM

## 2009-12-11 HISTORY — DX: Oligomenorrhea, unspecified: N91.5

## 2009-12-11 HISTORY — DX: Acute embolism and thrombosis of unspecified deep veins of unspecified lower extremity: I82.409

## 2009-12-11 HISTORY — PX: ENDOMETRIAL BIOPSY: SHX622

## 2009-12-28 ENCOUNTER — Encounter: Payer: Self-pay | Admitting: Internal Medicine

## 2009-12-30 ENCOUNTER — Ambulatory Visit: Payer: Self-pay | Admitting: Internal Medicine

## 2009-12-30 LAB — CONVERTED CEMR LAB
BUN: 7 mg/dL (ref 6–23)
Basophils Relative: 0 % (ref 0–1)
CO2: 26 meq/L (ref 19–32)
Calcium: 8.8 mg/dL (ref 8.4–10.5)
Chloride: 101 meq/L (ref 96–112)
Creatinine, Ser: 0.74 mg/dL (ref 0.40–1.20)
Eosinophils Absolute: 0.3 10*3/uL (ref 0.0–0.7)
Eosinophils Relative: 3 % (ref 0–5)
HCT: 41 % (ref 36.0–46.0)
Lymphs Abs: 4.3 10*3/uL — ABNORMAL HIGH (ref 0.7–4.0)
MCHC: 31.7 g/dL (ref 30.0–36.0)
MCV: 79.5 fL (ref >=78.0)
Monocytes Relative: 10 % (ref 3–12)
Platelets: 349 10*3/uL (ref 150–400)
WBC: 10.2 10*3/uL (ref 4.0–10.5)

## 2010-01-07 ENCOUNTER — Encounter: Payer: Self-pay | Admitting: Internal Medicine

## 2010-01-10 ENCOUNTER — Telehealth (INDEPENDENT_AMBULATORY_CARE_PROVIDER_SITE_OTHER): Payer: Self-pay | Admitting: *Deleted

## 2010-01-12 ENCOUNTER — Emergency Department (HOSPITAL_COMMUNITY): Admission: EM | Admit: 2010-01-12 | Discharge: 2010-01-12 | Payer: Self-pay | Admitting: Emergency Medicine

## 2010-01-14 ENCOUNTER — Encounter: Payer: Self-pay | Admitting: Internal Medicine

## 2010-01-17 ENCOUNTER — Ambulatory Visit: Payer: Self-pay | Admitting: Internal Medicine

## 2010-01-17 ENCOUNTER — Telehealth: Payer: Self-pay | Admitting: Internal Medicine

## 2010-01-20 ENCOUNTER — Ambulatory Visit: Payer: Self-pay | Admitting: Internal Medicine

## 2010-01-20 LAB — CONVERTED CEMR LAB: Blood Glucose, Fingerstick: 126

## 2010-02-07 ENCOUNTER — Encounter: Payer: Self-pay | Admitting: Internal Medicine

## 2010-02-07 ENCOUNTER — Telehealth (INDEPENDENT_AMBULATORY_CARE_PROVIDER_SITE_OTHER): Payer: Self-pay | Admitting: *Deleted

## 2010-02-08 LAB — HM MAMMOGRAPHY: HM Mammogram: NEGATIVE

## 2010-02-11 ENCOUNTER — Ambulatory Visit: Payer: Self-pay | Admitting: Internal Medicine

## 2010-02-16 ENCOUNTER — Telehealth: Payer: Self-pay | Admitting: *Deleted

## 2010-02-25 ENCOUNTER — Telehealth: Payer: Self-pay | Admitting: Internal Medicine

## 2010-03-01 ENCOUNTER — Telehealth: Payer: Self-pay | Admitting: Internal Medicine

## 2010-03-01 ENCOUNTER — Telehealth: Payer: Self-pay | Admitting: *Deleted

## 2010-03-10 ENCOUNTER — Ambulatory Visit (HOSPITAL_COMMUNITY): Admission: RE | Admit: 2010-03-10 | Discharge: 2010-03-10 | Payer: Self-pay | Admitting: Internal Medicine

## 2010-04-21 ENCOUNTER — Ambulatory Visit: Payer: Self-pay | Admitting: Internal Medicine

## 2010-04-21 LAB — HM DIABETES EYE EXAM: HM Diabetic Eye Exam: NEGATIVE

## 2010-04-22 DIAGNOSIS — E559 Vitamin D deficiency, unspecified: Secondary | ICD-10-CM | POA: Insufficient documentation

## 2010-04-22 DIAGNOSIS — E039 Hypothyroidism, unspecified: Secondary | ICD-10-CM | POA: Insufficient documentation

## 2010-04-22 LAB — CONVERTED CEMR LAB
ALT: 43 units/L — ABNORMAL HIGH (ref 0–35)
CO2: 26 meq/L (ref 19–32)
Chloride: 100 meq/L (ref 96–112)
Cholesterol: 167 mg/dL (ref 0–200)
Lymphocytes Relative: 45 % (ref 12–46)
Lymphs Abs: 4.8 10*3/uL — ABNORMAL HIGH (ref 0.7–4.0)
Monocytes Relative: 7 % (ref 3–12)
Neutro Abs: 4.9 10*3/uL (ref 1.7–7.7)
Neutrophils Relative %: 46 % (ref 43–77)
Potassium: 4.2 meq/L (ref 3.5–5.3)
RBC: 4.88 M/uL (ref 3.87–5.11)
Sodium: 139 meq/L (ref 135–145)
Total Bilirubin: 0.2 mg/dL — ABNORMAL LOW (ref 0.3–1.2)
Total Protein: 7.2 g/dL (ref 6.0–8.3)
VLDL: 29 mg/dL (ref 0–40)
Vit D, 25-Hydroxy: 19 ng/mL — ABNORMAL LOW (ref 30–89)
WBC: 10.7 10*3/uL — ABNORMAL HIGH (ref 4.0–10.5)

## 2010-04-25 ENCOUNTER — Ambulatory Visit: Payer: Self-pay | Admitting: Internal Medicine

## 2010-04-25 LAB — CONVERTED CEMR LAB: T3 Uptake Ratio: 31.5 % (ref 22.5–37.0)

## 2010-05-02 ENCOUNTER — Ambulatory Visit: Payer: Self-pay | Admitting: Surgery

## 2010-05-02 ENCOUNTER — Emergency Department (HOSPITAL_COMMUNITY): Admission: EM | Admit: 2010-05-02 | Discharge: 2010-05-02 | Payer: Self-pay | Admitting: Emergency Medicine

## 2010-05-02 ENCOUNTER — Encounter (INDEPENDENT_AMBULATORY_CARE_PROVIDER_SITE_OTHER): Payer: Self-pay | Admitting: Emergency Medicine

## 2010-05-03 ENCOUNTER — Telehealth: Payer: Self-pay | Admitting: Internal Medicine

## 2010-05-10 ENCOUNTER — Ambulatory Visit: Payer: Self-pay | Admitting: Internal Medicine

## 2010-05-20 ENCOUNTER — Emergency Department (HOSPITAL_COMMUNITY): Admission: EM | Admit: 2010-05-20 | Discharge: 2010-05-20 | Payer: Self-pay | Admitting: Emergency Medicine

## 2010-06-30 ENCOUNTER — Ambulatory Visit: Payer: Self-pay | Admitting: Internal Medicine

## 2010-06-30 LAB — CONVERTED CEMR LAB: Blood Glucose, Fingerstick: 209

## 2010-07-04 ENCOUNTER — Ambulatory Visit: Payer: Self-pay | Admitting: Internal Medicine

## 2010-07-04 ENCOUNTER — Encounter: Payer: Self-pay | Admitting: Internal Medicine

## 2010-07-04 DIAGNOSIS — R350 Frequency of micturition: Secondary | ICD-10-CM | POA: Insufficient documentation

## 2010-07-04 DIAGNOSIS — L299 Pruritus, unspecified: Secondary | ICD-10-CM | POA: Insufficient documentation

## 2010-07-04 DIAGNOSIS — I2699 Other pulmonary embolism without acute cor pulmonale: Secondary | ICD-10-CM | POA: Insufficient documentation

## 2010-07-04 LAB — CONVERTED CEMR LAB
Amphetamine Screen, Ur: NEGATIVE
AntiThromb III Func: 88 % (ref 76–126)
Anticardiolipin IgA: 6 (ref <22)
Anticardiolipin IgM: 3 (ref <11)
Barbiturate Quant, Ur: NEGATIVE
Bilirubin Urine: NEGATIVE
CO2: 26 meq/L (ref 19–32)
Calcium: 9.3 mg/dL (ref 8.4–10.5)
Chloride: 103 meq/L (ref 96–112)
Cocaine Metabolites: NEGATIVE
Creatinine, Ser: 0.69 mg/dL (ref 0.40–1.20)
Creatinine, Urine: 136.2 mg/dL
Crystals: NONE SEEN
Eosinophils Absolute: 0.1 10*3/uL (ref 0.0–0.7)
Eosinophils Relative: 1 % (ref 0–5)
Glucose, Bld: 231 mg/dL — ABNORMAL HIGH (ref 70–99)
HCT: 41 % (ref 36.0–46.0)
Hemoglobin: 13.4 g/dL (ref 12.0–15.0)
Homocysteine: 6.2 micromoles/L (ref 4.0–15.4)
Ketones, urine, test strip: NEGATIVE
Leukocytes, UA: NEGATIVE
Lymphocytes Relative: 38 % (ref 12–46)
Lymphs Abs: 3.4 10*3/uL (ref 0.7–4.0)
MCV: 81.8 fL (ref >=78.0)
Microalb Creat Ratio: 4.7 mg/g (ref 0.0–30.0)
Monocytes Absolute: 0.5 10*3/uL (ref 0.1–1.0)
Monocytes Relative: 6 % (ref 3–12)
Nitrite: NEGATIVE
Opiates: NEGATIVE
Platelets: 297 10*3/uL (ref 150–400)
Protein, U semiquant: NEGATIVE
Specific Gravity, Urine: 1.028 (ref 1.005–1.0)
Total Bilirubin: 0.3 mg/dL (ref 0.3–1.2)
Total Protein: 7.6 g/dL (ref 6.0–8.3)
Urobilinogen, UA: 0.2
Urobilinogen, UA: 0.2 (ref 0.0–1.0)
WBC: 8.9 10*3/uL (ref 4.0–10.5)

## 2010-07-13 ENCOUNTER — Encounter: Payer: Self-pay | Admitting: Internal Medicine

## 2010-08-08 ENCOUNTER — Telehealth: Payer: Self-pay | Admitting: Internal Medicine

## 2010-08-10 ENCOUNTER — Ambulatory Visit: Payer: Self-pay | Admitting: Internal Medicine

## 2010-08-10 LAB — CONVERTED CEMR LAB: Blood Glucose, Fingerstick: 243

## 2010-08-13 ENCOUNTER — Emergency Department (HOSPITAL_COMMUNITY): Admission: EM | Admit: 2010-08-13 | Discharge: 2010-08-13 | Payer: Self-pay | Admitting: Emergency Medicine

## 2010-09-09 ENCOUNTER — Telehealth: Payer: Self-pay | Admitting: Internal Medicine

## 2010-09-13 ENCOUNTER — Ambulatory Visit (HOSPITAL_COMMUNITY): Admission: RE | Admit: 2010-09-13 | Discharge: 2010-09-13 | Payer: Self-pay | Admitting: Obstetrics and Gynecology

## 2010-10-06 ENCOUNTER — Encounter: Payer: Self-pay | Admitting: Internal Medicine

## 2010-10-13 ENCOUNTER — Ambulatory Visit: Payer: Self-pay | Admitting: Internal Medicine

## 2010-10-13 LAB — HM DIABETES FOOT EXAM

## 2010-10-17 ENCOUNTER — Telehealth: Payer: Self-pay | Admitting: Internal Medicine

## 2010-10-20 ENCOUNTER — Encounter: Payer: Self-pay | Admitting: Internal Medicine

## 2010-10-24 ENCOUNTER — Telehealth: Payer: Self-pay | Admitting: Internal Medicine

## 2010-10-25 ENCOUNTER — Telehealth: Payer: Self-pay | Admitting: *Deleted

## 2010-10-28 ENCOUNTER — Encounter: Payer: Self-pay | Admitting: Internal Medicine

## 2010-11-14 ENCOUNTER — Telehealth (INDEPENDENT_AMBULATORY_CARE_PROVIDER_SITE_OTHER): Payer: Self-pay | Admitting: *Deleted

## 2010-11-29 ENCOUNTER — Ambulatory Visit: Payer: Self-pay

## 2010-12-06 LAB — CONVERTED CEMR LAB
BUN: 9 mg/dL (ref 6–23)
Basophils Absolute: 0.1 10*3/uL (ref 0.0–0.1)
Basophils Relative: 1 % (ref 0–1)
CO2: 26 meq/L (ref 19–32)
Calcium: 9.2 mg/dL (ref 8.4–10.5)
Chloride: 93 meq/L — ABNORMAL LOW (ref 96–112)
Creatinine, Ser: 0.81 mg/dL (ref 0.40–1.20)
Free T4: 1.13 ng/dL (ref 0.80–1.80)
Hemoglobin: 11.4 g/dL — ABNORMAL LOW (ref 12.0–15.0)
Lymphocytes Relative: 44 % (ref 12–46)
MCHC: 30.8 g/dL (ref 30.0–36.0)
Neutro Abs: 5.7 10*3/uL (ref 1.7–7.7)
Neutrophils Relative %: 48 % (ref 43–77)
RBC: 4.83 M/uL (ref 3.87–5.11)
RDW: 15.6 % — ABNORMAL HIGH (ref 11.5–15.5)
TSH: 6.902 microintl units/mL — ABNORMAL HIGH (ref 0.350–4.5)

## 2010-12-11 DIAGNOSIS — N760 Acute vaginitis: Secondary | ICD-10-CM

## 2010-12-11 DIAGNOSIS — N930 Postcoital and contact bleeding: Secondary | ICD-10-CM

## 2010-12-11 DIAGNOSIS — B9689 Other specified bacterial agents as the cause of diseases classified elsewhere: Secondary | ICD-10-CM

## 2010-12-11 HISTORY — DX: Postcoital and contact bleeding: N93.0

## 2010-12-11 HISTORY — DX: Other specified bacterial agents as the cause of diseases classified elsewhere: B96.89

## 2010-12-11 HISTORY — DX: Other specified bacterial agents as the cause of diseases classified elsewhere: N76.0

## 2011-01-01 ENCOUNTER — Encounter: Payer: Self-pay | Admitting: Internal Medicine

## 2011-01-01 ENCOUNTER — Observation Stay (HOSPITAL_COMMUNITY)
Admission: EM | Admit: 2011-01-01 | Discharge: 2011-01-02 | Payer: Self-pay | Source: Home / Self Care | Attending: Internal Medicine | Admitting: Internal Medicine

## 2011-01-02 ENCOUNTER — Encounter: Payer: Self-pay | Admitting: Ophthalmology

## 2011-01-03 LAB — MAGNESIUM: Magnesium: 1.9 mg/dL (ref 1.5–2.5)

## 2011-01-03 LAB — GLUCOSE, CAPILLARY
Glucose-Capillary: 442 mg/dL — ABNORMAL HIGH (ref 70–99)
Glucose-Capillary: 198 mg/dL — ABNORMAL HIGH (ref 70–99)

## 2011-01-03 LAB — LIPID PANEL
HDL: 36 mg/dL — ABNORMAL LOW (ref >39)
LDL Cholesterol: 84 mg/dL (ref 0–99)
Triglycerides: 102 mg/dL (ref <150)

## 2011-01-03 LAB — POCT I-STAT, CHEM 8
BUN: 7 mg/dL (ref 6–23)
Calcium, Ion: 1.04 mmol/L — ABNORMAL LOW (ref 1.12–1.32)
Chloride: 100 mEq/L (ref 96–112)
HCT: 42 % (ref 36.0–46.0)
Potassium: 4.3 mEq/L (ref 3.5–5.1)
Sodium: 135 mEq/L (ref 135–145)

## 2011-01-03 LAB — URINALYSIS, ROUTINE W REFLEX MICROSCOPIC
Hgb urine dipstick: NEGATIVE
Leukocytes, UA: NEGATIVE
Nitrite: NEGATIVE
Protein, ur: NEGATIVE mg/dL
Specific Gravity, Urine: 1.035 — ABNORMAL HIGH (ref 1.005–1.030)
Urobilinogen, UA: 0.2 mg/dL (ref 0.0–1.0)

## 2011-01-03 LAB — BASIC METABOLIC PANEL
BUN: 7 mg/dL (ref 6–23)
CO2: 25 mEq/L (ref 19–32)
Chloride: 106 mEq/L (ref 96–112)
GFR calc Af Amer: >60 mL/min (ref >60)
Potassium: 3.8 mEq/L (ref 3.5–5.1)

## 2011-01-03 LAB — HEPATIC FUNCTION PANEL
ALT: 16 U/L (ref 0–35)
AST: 14 U/L (ref 0–37)
Alkaline Phosphatase: 59 U/L (ref 39–117)
Total Protein: 6.4 g/dL (ref 6.0–8.3)

## 2011-01-03 LAB — URINE MICROSCOPIC-ADD ON

## 2011-01-03 LAB — CBC
Hemoglobin: 11.3 g/dL — ABNORMAL LOW (ref 12.0–15.0)
MCH: 23.3 pg — ABNORMAL LOW (ref 26.0–34.0)
MCV: 72.2 fL — ABNORMAL LOW (ref 78.0–100.0)
Platelets: 319 10*3/uL (ref 150–400)
RBC: 4.9 MIL/uL (ref 3.87–5.11)
WBC: 12.2 10*3/uL — ABNORMAL HIGH (ref 4.0–10.5)

## 2011-01-03 LAB — PHOSPHORUS: Phosphorus: 2.5 mg/dL (ref 2.3–4.6)

## 2011-01-03 LAB — POCT PREGNANCY, URINE: Preg Test, Ur: NEGATIVE

## 2011-01-03 LAB — KETONES, QUALITATIVE: Acetone, Bld: NEGATIVE

## 2011-01-03 LAB — VITAMIN B12: Vitamin B-12: 553 pg/mL (ref 211–911)

## 2011-01-04 ENCOUNTER — Telehealth: Payer: Self-pay | Admitting: Internal Medicine

## 2011-01-04 ENCOUNTER — Ambulatory Visit: Admission: RE | Admit: 2011-01-04 | Discharge: 2011-01-04 | Payer: Self-pay | Source: Home / Self Care

## 2011-01-04 LAB — CONVERTED CEMR LAB: Blood Glucose, Fingerstick: 269

## 2011-01-09 ENCOUNTER — Ambulatory Visit: Admission: RE | Admit: 2011-01-09 | Discharge: 2011-01-09 | Payer: Self-pay | Source: Home / Self Care

## 2011-01-10 LAB — CONVERTED CEMR LAB
Basophils Absolute: 0 10*3/uL (ref 0.0–0.1)
Basophils Relative: 0 % (ref 0–1)
Eosinophils Absolute: 0.1 10*3/uL (ref 0.0–0.7)
Ferritin: 36 ng/mL (ref 10–291)
MCHC: 32.2 g/dL (ref 30.0–36.0)
MCV: 74.3 fL — ABNORMAL LOW (ref 78.0–100.0)
Monocytes Relative: 6 % (ref 3–12)
Neutro Abs: 5.3 10*3/uL (ref 1.7–7.7)
Neutrophils Relative %: 53 % (ref 43–77)
Platelets: 352 10*3/uL (ref 150–400)
RDW: 18 % — ABNORMAL HIGH (ref 11.5–15.5)

## 2011-01-10 NOTE — Progress Notes (Signed)
Summary: Surgery  Phone Note Other Incoming   Caller: DR. Pennie Rushing Summary of Call: Call from Dr. Melvenia Needles is schedued for a GYN procedure on 09/13/2010.   Pt is being treated for Hyperplasia.  Idea also of inserting an IUD for her abnormal bleeding.  Dr. Charlcie Cradle would like to talk with her PCP prior to doing the procedure.  She can be reached at [phone]. Angelina Ok RN  September 09, 2010 9:24 AM     Initial call taken by: Angelina Ok RN,  September 09, 2010 9:24 AM  Follow-up for Phone Call        Spoke in detail with Dr. Pennie Rushing regarding Ms. Bullard PMH and risk for surgery. Plan is for hysteroscopy next week. Follow-up by: [name]  DO,  September 09, 2010 2:31 PM

## 2011-01-10 NOTE — Consult Note (Signed)
Summary: [facility]  [facility]   Imported By: Marylou Mccoy 12/23/2009 13:37:48  _____________________________________________________________________  External Attachment:    Type:   Image     Comment:   External Document

## 2011-01-10 NOTE — Assessment & Plan Note (Signed)
Summary: dsmt/mnt referral/dmr  request referral for dsmt and mnt  Diabetes Self Management Training Referral Patient Name: Makayla Frazier Date Of Birth: Sep 21, 1971 MRN: [id number] Current Diagnosis:  SORE THROAT (ICD-462) PRURITUS (ICD-698.9) PULMONARY EMBOLISM (ICD-415.19) URINARY FREQUENCY (ICD-788.41) HYPOTHYROIDISM (ICD-244.9) VITAMIN D DEFICIENCY (ICD-268.9) SYNCOPE AND COLLAPSE (ICD-780.2) FUNGAL DERMATITIS (ICD-111.9) MASTODYNIA (ICD-611.71) ILIOTIBIAL BAND SYNDROME, BILATERAL (ICD-728.89) DEPRESSION (ICD-311) ANEMIA (ICD-285.9) HYPERTENSION (ICD-401.9) AMENORRHEA (ICD-626.0) WEIGHT GAIN (ICD-783.1) PULMONARY EMBOLISM, HX OF (ICD-V12.51) DIABETES MELLITUS, TYPE II (ICD-250.00) VENOUS INSUFFICIENCY (ICD-459.81) CHEST WALL PAIN, ANTERIOR (ICD-786.52) HYPERSOMNIA WITH SLEEP APNEA UNSPECIFIED (ICD-780.53) FAMILY HISTORY DIABETES 1ST DEGREE RELATIVE (ICD-V18.0) FAMILY HISTORY OF CAD FEMALE 1ST DEGREE RELATIVE <60 (ICD-V16.49) NEUROPATHY (ICD-355.9) ASTHMA (ICD-493.90)     Management Training Needs:   Initial Diabetes Self management Training   Initial Medical Nutrition Therapy(3 hours/1st year) Please Specify change in Medical condition, treatment or diagnosis A1C elevated  Complicating Conditions:  Recurrent Hyperglycemia

## 2011-01-10 NOTE — Consult Note (Signed)
Summary: [facility]  [facility]   Imported By: Margie Billet 10/28/2010 10:25:20  _____________________________________________________________________  External Attachment:    Type:   Image     Comment:   External Document

## 2011-01-10 NOTE — Consult Note (Signed)
Summary: [facility]   Imported By: Florinda Marker 02/16/2010 14:02:41  _____________________________________________________________________  External Attachment:    Type:   Image     Comment:   External Document

## 2011-01-10 NOTE — Progress Notes (Signed)
Summary: palps & loop recorder issues  Phone Note Other Incoming Call back at Home Phone [phone]   Caller: Theodore Demark Summary of Call: per after hours message rhonda states this pt is having trouble with her loop recorder. she not sure if it is working correctly. Wants nurse to call and check to see how she is doing. Pt told rhonda that she had some palps. Initial call taken by: Edman Circle,  January 17, 2010 8:45 AM  Follow-up for Phone Call        spoke with pt.  woke up with heart beating fast 01/16/10 at 2:15am lasting 3 min.  went back to sleep.  The next fine and ok now. Will see if she can come if and get the recording off the ILR to see what her heart is doing.  I will call her back.  Dennis Bast, RN, BSN  January 17, 2010 8:57 AM lmom to come today at 3:30 to have ILR interrogated Dennis Bast, RN, BSN  January 17, 2010 10:58 AM

## 2011-01-10 NOTE — Letter (Signed)
[facility]   Imported By: Margie Billet 10/31/2010 15:19:45  _____________________________________________________________________  External Attachment:    Type:   Image     Comment:   External Document

## 2011-01-10 NOTE — Progress Notes (Signed)
Summary: Records request from [facility]  Request for records received from [facility]. Request forwarded to [facility]. Dena Chavis  February 07, 2010 2:30 PM

## 2011-01-10 NOTE — Progress Notes (Signed)
  Phone Note Other Incoming   Caller: ED physician. Details for Reason: Patient seen in the ED 2/2 syncope.  Details of Request: Wants patient to be seen at the clinic for Ed followup visit. Summary of Call: Patient was seen in the Ed and she was asymptomatic after her syncope episode today while shopping (under heat); X-ray, cardiac markers, EKG, vital signs and electrolytes were WNL. Decision was made to follow her as an outpatient for followup.

## 2011-01-10 NOTE — Assessment & Plan Note (Signed)
Summary: pacer check   Visit Type:  Follow-up Referring Provider:  pcp Primary Provider:  Loreen Freud, MD   History of Present Illness: Makayla Frazier returns today for follow-up.  She reports doing well recently.  She denies any further syncope since last being seen in our office.  She continues to have chronic breast pain. She denies exertional chest pain, shortness of breath, orthopnea, PND, lower extremity edema, dizziness, or seizures. The patient is tolerating medications without difficulties and is otherwise without complaint today.   Current Medications (verified): 1)  Albuterol 90 Mcg/act  Aers (Albuterol) .Marland Kitchen.. 1-2 Puffs Every 4-6 Hours As Needed For Wheezing or Shortness of Breath 2)  Victoza 18 Mg/14ml Soln (Liraglutide) .... Inject 0.6mg  Once A Day For 1 Week At The Same Time Each Day, Then Increase To 1.2mg   The Second Week and Thereafter 3)  Easy Touch Pen Needles 31g X 5 Mm Misc (Insulin Pen Needle) .... Use To Inject Victoza One A Day At The Same Time 4)  Lisinopril 5 Mg Tabs (Lisinopril) .... One By Mouth Daily  Allergies: 1)  ! * Vicodan 2)  ! Percocet  Past History:  Past Medical History: Reviewed history from 10/18/2009 and no changes required. Dysfunctional Uterine Bleeding Asthma Diabetes mellitus, type II Pulmonary embolism, hx of 1/09 Hx Major Depression with suicide attempt in 2009(overdose) Morbid obesity.  Obstructive sleep apnea, noncompliant with CPAP.  Anemia.  Unexplained recurrent syncope  Past Surgical History: Reviewed history from 10/20/2009 and no changes required. Status post cholecystectomy.  ILR placement 10/19/09  Vital Signs:  Patient profile:   40 year old female Height:      60 inches Weight:      197 pounds BMI:     38.61 Pulse rate:   60 / minute BP sitting:   128 / 80  (left arm)  Vitals Entered By: Makayla Frazier CMA (February 11, 2010 9:42 AM)  Physical Exam  General:  alert, obese.   Head:  normocephalic and atraumatic.   Eyes:   PERRLA/EOM intact; conjunctiva and lids normal. Mouth:  Teeth, gums and palate normal. Oral mucosa normal. Neck:  Neck supple, no JVD. No masses, thyromegaly or abnormal cervical nodes. Lungs:  normal respiratory effort, normal breath sounds, no crackles, and no wheezes.   Heart:  normal rate, regular rhythm, and no murmur.   Abdomen:  soft, non-tender, and normal bowel sounds.   Msk:  Back normal, normal gait. Muscle strength and tone normal. Pulses:  pulses normal in all 4 extremities Extremities:  No clubbing or cyanosis. Neurologic:  alert & oriented X3.  strength/sensation are intact Skin:  Intact without lesions or rashes. Psych:  Normal affect.    ILR Following MD Makayla Range, MD DOI:  10/19/2009 Vendor:  [employer]     Model Number:  [id number]     Serial Number [id number]       Tachy Episodes:  0     Brady Episodes:  0 ILR Next Due 08/11/2010  Tech Comments:  Normal device function. No episodes.  ROV 6 months clinic. Makayla Balsam RN BSN  February 11, 2010 9:56 AM   MD Comments:  agree.  No arrhythmias documented.  Impression & Recommendations:  Problem # 1:  SYNCOPE AND COLLAPSE (ICD-780.2) Doing well without further syncope.  I have been adamant that I do no think that she should drive.  We will consider driving again if she has had no further syncope when she sees me again in 6 months. She  is to activate her ILR if syncope or other cardiac symptoms occur.  Problem # 2:  HYPERTENSION (ICD-401.9) stable Her updated medication list for this problem includes:    Lisinopril 5 Mg Tabs (Lisinopril) ..... One by mouth daily  Problem # 3:  CHEST WALL PAIN, ANTERIOR (ICD-786.52) stable normal exercise stress test 2010  Her updated medication list for this problem includes:    Lisinopril 5 Mg Tabs (Lisinopril) ..... One by mouth daily  Patient Instructions: 1)  Your physician recommends that you schedule a follow-up appointment in: 6 months with Dr Makayla Frazier

## 2011-01-10 NOTE — Assessment & Plan Note (Signed)
Summary: FU VISIT/DS   Vital Signs:  Patient profile:   40 year old female Height:      60 inches (152.40 cm) Weight:      182.03 pounds (82.74 kg) BMI:     35.68 Temp:     98.7 degrees F (37.06 degrees C) oral Pulse rate:   84 / minute BP sitting:   125 / 86  (left arm)  Vitals Entered By: Angelina Ok RN (October 13, 2010 8:36 AM) CC: Depression Is Patient Diabetic? Yes Did you bring your meter with you today? No Pain Assessment Patient in pain? no      Nutritional Status BMI of > 30 = obese  Have you ever been in a relationship where you felt threatened, hurt or afraid?No   Does patient need assistance? Functional Status Self care Ambulation Normal Comments Breast reduction 07/26/2010.  Insulin is making her sick.  Makes her nauseous.  Needs refills on meds.   Primary Care Provider:  [name]  DO  CC:  Depression.  History of Present Illness: Makayla Frazier is in for routine follow up today. She is concerned today that her Victoza is making her nausea which starts shortly after she takes the medication. She is now s/p breast reduction doing very well and also recently had an IUD placed by Dr. Pennie Rushing. Still having occasional episodes of ?syncope v. absence seizures-her SO is in the room and confirms these events. She has not followed up with neurology. Reports increased use of her rescue inhaler for asthma symptoms.  Depression History:      The patient is having a depressed mood most of the day but denies diminished interest in her usual daily activities.        The patient denies that she feels like life is not worth living, denies that she wishes that she were dead, and denies that she has thought about ending her life.        Comments:  Occassional.   Preventive Screening-Counseling & Management  Alcohol-Tobacco     Alcohol drinks/day: 0     Smoking Status: never     Smoking Cessation Counseling: yes     Passive Smoke Exposure: no  Current Medications  (verified): 1)  Albuterol 90 Mcg/act  Aers (Albuterol) .Marland Kitchen.. 1-2 Puffs Every 4-6 Hours As Needed For Wheezing or Shortness of Breath 2)  Victoza 18 Mg/56ml Soln (Liraglutide) .... Inject 0.6mg  Once A Day For 1 Week At The Same Time Each Day, Then Increase To 1.2mg   The Second Week and Thereafter 3)  Easy Touch Pen Needles 31g X 5 Mm Misc (Insulin Pen Needle) .... Use To Inject Victoza One A Day At The Same Time 4)  Lisinopril 5 Mg Tabs (Lisinopril) .... One By Mouth Daily 5)  Aspir-Low 81 Mg Tbec (Aspirin) .... Take 1 Tablet By Mouth Once A Day 6)  Hydroxyzine Hcl 25 Mg Tabs (Hydroxyzine Hcl) .... Take 1 Tablet By Mouth Two Times A Day For Itching 7)  Advair Diskus 100-50 Mcg/dose Aepb (Fluticasone-Salmeterol) .... Take One Puff Twice Daily For Asthma  Allergies (verified): 1)  ! * Vicodan 2)  ! Percocet  Review of Systems      See HPI  Physical Exam  General:  alert, well-developed, and well-nourished.   Neck:  No deformities, masses, or tenderness noted. Lungs:  Normal respiratory effort, chest expands symmetrically. Lungs are clear to auscultation, no crackles or wheezes. Heart:  Normal rate and regular rhythm. S1 and S2 normal without gallop,  murmur, click, rub or other extra sounds. Abdomen:  Bowel sounds positive,abdomen soft and non-tender without masses, organomegaly or hernias noted. Msk:  no chest wall tenderness, still with mild upper back pain Pulses:  R and L carotid,radial,femoral,dorsalis pedis and posterior tibial pulses are full and equal bilaterally Extremities:  No swelling or edema Neurologic:  No cranial nerve deficits noted. Station and gait are normal. Plantar reflexes are down-going bilaterally. DTRs are symmetrical throughout. Sensory, motor and coordinative functions appear intact. Skin:  Well healed breast inscisions.Keloid scar on upper left chest.  Diabetes Management Exam:    Foot Exam (with socks and/or shoes not present):       Sensory-Monofilament:           Left foot: normal          Right foot: normal   Impression & Recommendations:  Problem # 1:  HYPOTHYROIDISM (ICD-244.9) Will repeat TSH and Free T4 today. Not on Synthroid. Orders: T-TSH ([id number]) T-T4, Free [id number])  Problem # 2:  VITAMIN D DEFICIENCY (ICD-268.9) Patient did not take vitamin d prescribed because it made her nauseated.   Problem # 3:  SYNCOPE AND COLLAPSE (ICD-780.2) This is still concerning to me-for now I will have a sleep study repeated and have her schedule f/u with neurology. She ideally needs an epilepsy monitoring unit. Could also try low dose AEM.   Problem # 4:  HYPERSOMNIA WITH SLEEP APNEA UNSPECIFIED (ICD-780.53) Will order split night repeat study today. Orders: Sleep Disorder Referral (Sleep Disorder)  Problem # 5:  ASTHMA (ICD-493.90) Increased inhaler use- will start her on low dose Advair. F/U in 3 months,  Her updated medication list for this problem includes:    Albuterol 90 Mcg/act Aers (Albuterol) .Marland Kitchen... 1-2 puffs every 4-6 hours as needed for wheezing or shortness of breath    Advair Diskus 100-50 Mcg/dose Aepb (Fluticasone-salmeterol) .Marland Kitchen... Take one puff twice daily for asthma  Orders: T-Basic Metabolic Panel [phone]) T-CBC w/Diff [phone])  Problem # 6:  DIABETES MELLITUS, TYPE II (ICD-250.00) Issues with nausea and Victoza increase. Will back down to 0.9 dose and f/u in 3 months- she has not been taking it due to nausea. I also encouraged her to take CBG readings in AM and after meals when possible. Continue Lisinoipril for kidney protection/protienuria and low dose ASA.   Her updated medication list for this problem includes:    Victoza 18 Mg/78ml Soln (Liraglutide) ..... Inject 0.9mg  once a day as directed for blood sugar control    Lisinopril 5 Mg Tabs (Lisinopril) ..... One by mouth daily    Aspir-low 81 Mg Tbec (Aspirin) .Marland Kitchen... Take 1 tablet by mouth once a day  Orders: T- Capillary Blood Glucose ([id number]) T-Hgb  A1C (in-house) (28413KG)  Complete Medication List: 1)  Albuterol 90 Mcg/act Aers (Albuterol) .Marland Kitchen.. 1-2 puffs every 4-6 hours as needed for wheezing or shortness of breath 2)  Victoza 18 Mg/75ml Soln (Liraglutide) .... Inject 0.9mg  once a day as directed for blood sugar control 3)  Easy Touch Pen Needles 31g X 5 Mm Misc (Insulin pen needle) .... Use to inject victoza one a day at the same time 4)  Lisinopril 5 Mg Tabs (Lisinopril) .... One by mouth daily 5)  Aspir-low 81 Mg Tbec (Aspirin) .... Take 1 tablet by mouth once a day 6)  Hydroxyzine Hcl 25 Mg Tabs (Hydroxyzine hcl) .... Take 1 tablet by mouth two times a day for itching 7)  Advair Diskus 100-50 Mcg/dose Aepb (Fluticasone-salmeterol) .... Take one puff  twice daily for asthma  Patient Instructions: 1)  F/U in 3 months. 2)  Needs appointment with Scherrie Gerlach. for DM education. Prescriptions: ADVAIR DISKUS 100-50 MCG/DOSE AEPB (FLUTICASONE-SALMETEROL) Take one puff twice daily for Asthma  #1 x 6   Entered and Authorized by:   [name]  DO   Signed by:   [name]  DO on 10/13/2010   Method used:   Electronically to        [facility] (retail)       [address]       [place], [place]  [address]       Ph: [phone]       Fax: [phone]   RxID:   [id number]    Orders Added: 1)  T- Capillary Blood Glucose [82948] 2)  T-Hgb A1C (in-house) [83036QW] 3)  Sleep Disorder Referral [Sleep Disorder] 4)  Est. Patient Level IV [55732] 5)  T-Basic Metabolic Panel [[id number]] 6)  T-CBC w/Diff [[id number]] 7)  T-TSH [[id number]] 8)  T-T4, Free [[id number]]    Prevention & Chronic Care Immunizations   Influenza vaccine: already had '09  (09/18/2008)   Influenza vaccine deferral: Refused  (08/10/2010)    Tetanus booster: 07/04/2010: Tdap   Td booster deferral: Deferred  (05/10/2010)    Pneumococcal vaccine: Not documented   Pneumococcal vaccine deferral: Deferred   (05/10/2010)  Other Screening   Pap smear: Not documented   Pap smear action/deferral: Deferred  (05/10/2010)   Smoking status: never  (10/13/2010)  Diabetes Mellitus   HgbA1C: 8.2  (10/13/2010)    Eye exam: No diabetic retinopathy.     (04/21/2010)   Eye exam due: 04/2011    Foot exam: yes  (10/13/2010)   High risk foot: Yes  (10/13/2010)   Foot care education: Not documented    Urine microalbumin/creatinine ratio: 4.7  (07/04/2010)   Urine microalbumin action/deferral: Ordered  Lipids   Total Cholesterol: 167  (04/21/2010)   LDL: 93  (04/21/2010)   LDL Direct: Not documented   HDL: 45  (04/21/2010)   Triglycerides: 143  (04/21/2010)  Hypertension   Last Blood Pressure: 125 / 86  (10/13/2010)   Serum creatinine: 0.69  (07/04/2010)   Serum potassium 3.8  (07/04/2010)  Self-Management Support :   Personal Goals (by the next clinic visit) :     Personal A1C goal: 7  (10/05/2009)     Personal blood pressure goal: 140/90  (10/05/2009)     Personal LDL goal: 70  (10/05/2009)    Patient will work on the following items until the next clinic visit to reach self-care goals:     Medications and monitoring: take my medicines every day, check my blood sugar, bring all of my medications to every visit, examine my feet every day  (10/13/2010)     Eating: drink diet soda or water instead of juice or soda, eat more vegetables, use fresh or frozen vegetables, eat foods that are low in salt, eat baked foods instead of fried foods, eat fruit for snacks and desserts, limit or avoid alcohol  (10/13/2010)     Activity: take a 30 minute walk every day  (10/13/2010)    Diabetes self-management support: Written self-care plan, Education handout, Pre-printed educational material, Resources for patients handout  (10/13/2010)   Diabetes care plan printed   Diabetes education handout printed    Hypertension self-management support: Written self-care plan, Education handout, Pre-printed  educational material, Resources for patients  handout  (10/13/2010)   Hypertension self-care plan printed.   Hypertension education handout printed      Resource handout printed.    Last LDL:                                                 93 (04/21/2010 7:18:00 PM)        Diabetic Foot Exam Last Podiatry Exam Date: 08/03/2008 Foot Inspection Is there a history of a foot ulcer?              No Is there a foot ulcer now?              No Can the patient see the bottom of their feet?          Yes Are the shoes appropriate in style and fit?          Yes Is there swelling or an abnormal foot shape?          Yes Are the toenails long?                No Are the toenails thick?                No Are the toenails ingrown?              No Is there heavy callous build-up?              Yes Is there a claw toe deformity?                          No Is there elevated skin temperature?            No Is there limited ankle dorsiflexion?            Yes Is there foot or ankle muscle weakness?            No Do you have pain in calf while walking?           No      Comments: Bunion on side of left foot.  Callus build up mild heel of left foot, ball of both feet. High Risk Feet? Yes   10-g (5.07) Semmes-Weinstein Monofilament Test Performed by: Angelina Ok RN          Right Foot          Left Foot Visual Inspection               Test Control      normal         normal Site 1         normal         normal Site 2         normal         normal Site 3         normal         normal Site 4         normal         normal Site 5         normal         normal Site 6         normal         normal Site 7  normal         normal Site 8         normal         normal Site 9         normal         normal Site 10         normal         normal  Impression      normal         normal   Laboratory Results   Blood Tests   Date/Time Received: October 13, 2010 9:29 AM Date/Time Reported:  Burke Keels  October 13, 2010 9:29 AM   HGBA1C: 8.2%   (Normal Range: Non-Diabetic - 3-6%   Control Diabetic - 6-8%) CBG Fasting:: 140mg /dL

## 2011-01-10 NOTE — Assessment & Plan Note (Signed)
Summary: CHECKUP/SB.   Vital Signs:  Patient profile:   40 year old female Height:      60 inches (152.40 cm) Weight:      197.4 pounds (89.55 kg) BMI:     38.69 Temp:     97.4 degrees F (36.33 degrees C) oral Pulse rate:   77 / minute BP sitting:   127 / 91  (left arm) Cuff size:   regular  Vitals Entered By: Theotis Barrio NT II (Apr 21, 2010 9:47 AM) CC: ROUTINE OFFICE VISIT /  FOOT CHCEK DONE   Is Patient Diabetic? Yes Did you bring your meter with you today? No Pain Assessment Patient in pain? no      Nutritional Status BMI of > 30 = obese  Have you ever been in a relationship where you felt threatened, hurt or afraid?No   Does patient need assistance? Functional Status Self care Ambulation Normal Comments ROUTINE OFFICE / DM   Primary Care Provider:  [name]  DO  CC:  ROUTINE OFFICE VISIT /  FOOT CHCEK DONE  .  History of Present Illness: Janene comes in today for routine follow-up. She has been doing very well recently- she denies any episodes of syncope. She continues to have severe left leg pain, worse with standing for long periods of time or with walking. Swelling intermittently, no redness. Sensation intact, strength good. She continues to have almost daily chest pain, upper back pain and breast pain.  She complains of a rash under her breasts that is chronic.  Depression History:      The patient denies a depressed mood most of the day and a diminished interest in her usual daily activities.  Positive alarm features for depression include fatigue (loss of energy) and feelings of worthlessness (guilt).  However, she denies significant weight loss, significant weight gain, insomnia, hypersomnia, psychomotor agitation, psychomotor retardation, impaired concentration (indecisiveness), and recurrent thoughts of death or suicide.        The patient denies that she feels like life is not worth living, denies that she wishes that she were dead, and denies that she  has thought about ending her life.         Preventive Screening-Counseling & Management  Alcohol-Tobacco     Smoking Status: never     Smoking Cessation Counseling: yes     Passive Smoke Exposure: no  Caffeine-Diet-Exercise     Does Patient Exercise: no     Times/week: 4-5  Current Medications (verified): 1)  Albuterol 90 Mcg/act  Aers (Albuterol) .Marland Kitchen.. 1-2 Puffs Every 4-6 Hours As Needed For Wheezing or Shortness of Breath 2)  Victoza 18 Mg/24ml Soln (Liraglutide) .... Inject 0.6mg  Once A Day For 1 Week At The Same Time Each Day, Then Increase To 1.2mg   The Second Week and Thereafter 3)  Easy Touch Pen Needles 31g X 5 Mm Misc (Insulin Pen Needle) .... Use To Inject Victoza One A Day At The Same Time 4)  Lisinopril 5 Mg Tabs (Lisinopril) .... One By Mouth Daily 5)  Aspir-Low 81 Mg Tbec (Aspirin) .... Take 1 Tablet By Mouth Once A Day  Allergies (verified): 1)  ! * Vicodan 2)  ! Percocet  Review of Systems      See HPI  Physical Exam  General:  alert, well-developed, and well-nourished.   Neck:  supple.  full ROM and no masses.   Chest Wall:  pectoralis tenderness Breasts:  rash inframammory fold(s).   Lungs:  normal respiratory effort, normal  breath sounds, no crackles, and no wheezes.   Heart:  normal rate, regular rhythm, and no murmur.   Abdomen:  soft, non-tender, and normal bowel sounds.   Msk:  no joint tenderness and no joint swelling.   Extremities:  trace left pedal edema . pulses normal. Neurologic:  alert & oriented X3, strength normal in all extremities, and sensation intact to light touch.   Psych:  Oriented X3, memory intact for recent and remote, normally interactive, good eye contact, not anxious appearing, and not depressed appearing.    Diabetes Management Exam:    Foot Exam (with socks and/or shoes not present):       Sensory-Monofilament:          Left foot: normal          Right foot: normal   Impression & Recommendations:  Problem # 1:  PULMONARY  EMBOLISM, HX OF (ICD-V12.51) Dx in 2009. CT showed resolution of PE in 2010. Off Coumadin. Off Oral Cotraception. 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 Hypercoag neg. Continue t9o follow. No signs of PAH.   Her updated medication list for this problem includes:    Aspir-low 81 Mg Tbec (Aspirin) .Marland Kitchen... Take 1 tablet by mouth once a day  Problem # 2:  SYNCOPE AND COLLAPSE (ICD-780.2) No clear cause identified. Continuing to follow.  Problem # 3:  HYPERTENSION (ICD-401.9) BP well controlled on Lisinopril for renal protection protienuria.  Her updated medication list for this problem includes:    Lisinopril 5 Mg Tabs (Lisinopril) ..... One by mouth daily  BP today: 127/91 Prior BP: 128/80 (02/11/2010)  Labs Reviewed: K+: 4.0 (12/30/2009) Creat: : 0.74 (12/30/2009)     Problem # 4:  MASTODYNIA (ICD-611.71) She is seeing Dr. Kathie Dike at [facility]. Will follow up recs in July. Breast reduction is medically necessary in this patient for multiple reasons- recurrent CP and ED visits, chronic pain, rashes.   Problem # 5:  DIABETES MELLITUS, TYPE II (ICD-250.00) No change- doing very well on Victoza. Will check A1C today and routine labs.  Her updated medication list for this problem includes:    Victoza 18 Mg/65ml Soln (Liraglutide) ..... Inject 0.6mg  once a day for 1 week at the same time each day, then increase to 1.2mg   the second week and thereafter    Lisinopril 5 Mg Tabs (Lisinopril) ..... One by mouth daily    Aspir-low 81 Mg Tbec (Aspirin) .Marland Kitchen... Take 1 tablet by mouth once a day  Labs Reviewed: Creat: 0.74 (12/30/2009)    Reviewed HgBA1c results: 7.7 (12/30/2009)  7.8 (10/05/2009)  Orders: T-Hgb A1C (in-house) ([id number]) T-Comprehensive Metabolic Panel [id number]) T-Lipid Profile ([id number]) T-TSH ([id number]) T-CBC w/Diff [id number]) T-Vitamin D (25-Hydroxy) [phone])  Labs Reviewed: Creat: 0.74 (12/30/2009)    Reviewed HgBA1c results: 7.0 (04/21/2010)  7.7  (12/30/2009)  Complete Medication List: 1)  Albuterol 90 Mcg/act Aers (Albuterol) .Marland Kitchen.. 1-2 puffs every 4-6 hours as needed for wheezing or shortness of breath 2)  Victoza 18 Mg/81ml Soln (Liraglutide) .... Inject 0.6mg  once a day for 1 week at the same time each day, then increase to 1.2mg   the second week and thereafter 3)  Easy Touch Pen Needles 31g X 5 Mm Misc (Insulin pen needle) .... Use to inject victoza one a day at the same time 4)  Lisinopril 5 Mg Tabs (Lisinopril) .... One by mouth daily 5)  Aspir-low 81 Mg Tbec (Aspirin) .... Take 1 tablet by mouth once a day  Patient Instructions: 1)  Please schedule a follow-up appointment  in 3 months. Process Orders Check Orders Results:     [facility]: ABN not required for this insurance Tests Sent for requisitioning (Apr 22, 2010 11:37 AM):     04/21/2010: [facility] -- T-Comprehensive Metabolic Panel [[id number]] (signed)     04/21/2010: [facility] -- T-Lipid Profile [phone] (signed)     04/21/2010: [facility] -- T-TSH [phone] (signed)     04/21/2010: [facility] -- T-CBC w/Diff [[id number]] (signed)     04/21/2010: [facility] -- T-Vitamin D (25-Hydroxy) [phone] (signed)    Prevention & Chronic Care Immunizations   Influenza vaccine: already had '09  (09/18/2008)   Influenza vaccine deferral: Refused  (10/05/2009)    Tetanus booster: Not documented    Pneumococcal vaccine: Not documented  Other Screening   Pap smear: Not documented   Smoking status: never  (04/21/2010)  Diabetes Mellitus   HgbA1C: 7.0  (04/21/2010)    Eye exam: Not documented    Foot exam: yes  (04/21/2010)   High risk foot: Not documented   Foot care education: Not documented    Urine microalbumin/creatinine ratio: Not documented  Lipids   Total Cholesterol: Not documented   LDL: Not documented   LDL Direct: Not documented    HDL: Not documented   Triglycerides: Not documented  Hypertension   Last Blood Pressure: 127 / 91  (04/21/2010)   Serum creatinine: 0.74  (12/30/2009)   Serum potassium 4.0  (12/30/2009) CMP ordered   Self-Management Support :   Personal Goals (by the next clinic visit) :     Personal A1C goal: 7  (10/05/2009)     Personal blood pressure goal: 140/90  (10/05/2009)     Personal LDL goal: 70  (10/05/2009)    Patient will work on the following items until the next clinic visit to reach self-care goals:     Medications and monitoring: take my medicines every day, bring all of my medications to every visit, weigh myself weekly, examine my feet every day  (04/21/2010)     Eating: eat more vegetables, use fresh or frozen vegetables, eat foods that are low in salt, eat baked foods instead of fried foods, eat fruit for snacks and desserts, limit or avoid alcohol  (04/21/2010)     Activity: take the stairs instead of the elevator  (04/21/2010)    Diabetes self-management support: Copy of home glucose meter record  (01/20/2010)    Hypertension self-management support: Written self-care plan  (10/05/2009)           Diabetic Foot Exam Foot Inspection Is there a history of a foot ulcer?              No Is there a foot ulcer now?              No Can the patient see the bottom of their feet?          Yes Are the shoes appropriate in style and fit?          Yes Is there swelling or an abnormal foot shape?          No Are the toenails long?                No Are the toenails thick?                No Are the toenails ingrown?              No Is there  heavy callous build-up?              No Is there a claw toe deformity?                          No Is there elevated skin temperature?            No Is there limited ankle dorsiflexion?            No Is there foot or ankle muscle weakness?            No Do you have pain in calf while walking?           Yes         10-g (5.07)  Semmes-Weinstein Monofilament Test Performed by: Theotis Barrio NT II          Right Foot          Left Foot Visual Inspection     normal           normal Test Control      normal         normal Site 1         normal         normal Site 2         normal         normal Site 3         normal         normal Site 4         normal         normal Site 5         normal         normal Site 6         normal         normal Site 7         normal         normal Site 8         normal         normal Site 9         normal         normal Site 10         normal         normal  Impression      normal         normal   Laboratory Results   Blood Tests   Date/Time Received: Apr 21, 2010 10:36 AM Date/Time Reported: Alric Quan  Apr 21, 2010 10:36 AM   HGBA1C: 7.0%   (Normal Range: Non-Diabetic - 3-6%   Control Diabetic - 6-8%)

## 2011-01-10 NOTE — Assessment & Plan Note (Signed)
Summary: ACUTE-ER/FU-/cfb(GOLDING)/CFB   Vital Signs:  Patient profile:   40 year old female Height:      60 inches (152.40 cm) Weight:      195.3 pounds (89.73 kg) BMI:     38.69 Temp:     97.3 degrees F (36.28 degrees C) oral Pulse rate:   63 / minute Pulse (ortho):   71 / minute BP sitting:   129 / 93  (right arm) BP standing:   124 / 91 Cuff size:   regular  Vitals Entered By: Theotis Barrio NT II (May 10, 2010 8:45 AM)  Serial Vital Signs/Assessments:  Time      Position  BP       Pulse  Resp  Temp     By 11:24 AM  Lying RA  122/87   63                    Lela Sturdivant NT II 11:24 AM  Sitting   128/86   66                    Lela Sturdivant NT II 11:24 AM  Standing  124/91   71                    Lela Sturdivant NT II  Comments: 11:24 AM PATIENT DID NOT HAVE ANY DIZZINESS NOR LITE HEADENSS UP ON SITTING OR STANDING. By: Theotis Barrio NT II   CC: ER FOLLOW UP / PATIENT STATES SHE "[facility]ED [facility]" IN [facility].,  Is Patient Diabetic? Yes Did you bring your meter with you today? No Pain Assessment Patient in pain? no      Nutritional Status BMI of > 30 = obese  Have you ever been in a relationship where you felt threatened, hurt or afraid?No   Does patient need assistance? Functional Status Self care Ambulation Normal Comments ER FOLLOW UP APPT ." PATIENT STATES SHE PASSED OUT IN  THE [facility].   Primary Care Provider:  [name]  DO  CC:  ER FOLLOW UP / PATIENT STATES SHE "PASSED [facility]" IN THE [facility]. and .  History of Present Illness: [name] is a 40 yo lady with PMH as outlined in the EMR comes today as she recently visited ED for a syncopal episode. She was in [employer] and she was standing and looking at plates to buy. Next thing she remembered was that she was in the floor surrounded by EMS people. She got real hot before she passed out. No dizziness, CP, SOB. No fever or chills recently. She doesnot know how long she passed out,  but guesses that it is probably a few minutes. She was probably disoriented for a minute or two when she woke up. Her CBG then was 340 and she doesnot know if her BP was low or high. She has implanted event monitor and it was checked on 02/2010 and she was told nothing significant was found. She will f/u with Dr. Johney Frame in a few months.   Depression: Her mood is good. No SI. She last saw Dr. Lang Snow in 2009.   Vitamin D defcy: She has not started taking vit. D.  Depression History:      The patient denies a depressed mood most of the day and a diminished interest in her usual daily activities.         Preventive Screening-Counseling & Management  Alcohol-Tobacco  Smoking Status: never     Smoking Cessation Counseling: yes     Passive Smoke Exposure: no  Caffeine-Diet-Exercise     Does Patient Exercise: no     Times/week: 4-5  Current Medications (verified): 1)  Albuterol 90 Mcg/act  Aers (Albuterol) .Marland Kitchen.. 1-2 Puffs Every 4-6 Hours As Needed For Wheezing or Shortness of Breath 2)  Victoza 18 Mg/41ml Soln (Liraglutide) .... Inject 0.6mg  Once A Day For 1 Week At The Same Time Each Day, Then Increase To 1.2mg   The Second Week and Thereafter 3)  Easy Touch Pen Needles 31g X 5 Mm Misc (Insulin Pen Needle) .... Use To Inject Victoza One A Day At The Same Time 4)  Lisinopril 5 Mg Tabs (Lisinopril) .... One By Mouth Daily 5)  Aspir-Low 81 Mg Tbec (Aspirin) .... Take 1 Tablet By Mouth Once A Day 6)  Ergocalciferol 50000 Unit Caps (Ergocalciferol) .... Take 1 Capsule Weekly For 8 Weeks  Allergies: 1)  ! * Vicodan 2)  ! Percocet  Review of Systems      See HPI  Physical Exam  Mouth:  pharynx pink and moist.   Lungs:  normal breath sounds, no crackles, and no wheezes.   Heart:  normal rate, regular rhythm, no murmur, no gallop, and no rub.   Extremities:  trace left pedal edema and trace right pedal edema.   Neurologic:  alert & oriented X3, cranial nerves II-XII intact, strength normal  in all extremities, sensation intact to light touch, gait normal, and finger-to-nose normal.   Cervical Nodes:  no anterior cervical adenopathy and no posterior cervical adenopathy.     Impression & Recommendations:  Problem # 1:  HYPOTHYROIDISM (ICD-244.9) Her repeat labs are WNL.   Problem # 2:  VITAMIN D DEFICIENCY (ICD-268.9) WIll start on high dose Vit D for 8 wks and check Vit D level after that and also start on calcium and vitamin D pill then.   Problem # 3:  SYNCOPE AND COLLAPSE (ICD-780.2) Please see HPI. I donot know the exact etiology. Neuro exam today is non-focal. She is not orthostatic today. Her CBG was not low at the time of episode. She had multiple similar episodes in the past. She had extensive w/u including, negative CBC, CMET, CT head, EEG, implanted event monitor last interrogated on 3/11 and was negative for arrythmia. On last inpatient w/u in 12/10, plan was psych consultation but she didnot want that and left the hospital before psych consult. Today again she and her boyfriend think psych consultaion is a waste of time and they say that her PCP dr [name] never told them about this. I encouraged her to see Dr. [name] on next appt, so that she can consider this if Dr. [name] feels that Ms. Bullard needs psych consult. I also asked the pt to call Dr. Jenel Lucks office to notify about her recent syncopal episode. WIll f/u in 2 wks.   Problem # 4:  DEPRESSION (ICD-311) Mood is great and has no SI. She no longer follows with Dr. Lang Snow.   Problem # 5:  HYPERTENSION (ICD-401.9) BP almost at goal, will not be further aggressive because of her recurrent syncopal events.  Her updated medication list for this problem includes:    Lisinopril 5 Mg Tabs (Lisinopril) ..... One by mouth daily  BP today: 129/93 Prior BP: 127/91 (04/21/2010)  Labs Reviewed: K+: 4.2 (04/21/2010) Creat: : 0.77 (04/21/2010)   Chol: 167 (04/21/2010)   HDL: 45 (04/21/2010)   LDL: 93 (04/21/2010)  TG:  143 (04/21/2010)  Complete Medication List: 1)  Albuterol 90 Mcg/act Aers (Albuterol) .Marland Kitchen.. 1-2 puffs every 4-6 hours as needed for wheezing or shortness of breath 2)  Victoza 18 Mg/34ml Soln (Liraglutide) .... Inject 0.6mg  once a day for 1 week at the same time each day, then increase to 1.2mg   the second week and thereafter 3)  Easy Touch Pen Needles 31g X 5 Mm Misc (Insulin pen needle) .... Use to inject victoza one a day at the same time 4)  Lisinopril 5 Mg Tabs (Lisinopril) .... One by mouth daily 5)  Aspir-low 81 Mg Tbec (Aspirin) .... Take 1 tablet by mouth once a day 6)  Ergocalciferol 50000 Unit Caps (Ergocalciferol) .... Take 1 capsule weekly for 8 weeks  Patient Instructions: 1)  Please schedule a follow-up appointment in 2 weeks. 2)  Limit your Sodium (Salt) to less than 2 grams a day(slightly less than 1/2 a teaspoon) to prevent fluid retention, swelling, or worsening of symptoms. 3)  It is important that you exercise regularly at least 20 minutes 5 times a week. If you develop chest pain, have severe difficulty breathing, or feel very tired , stop exercising immediately and seek medical attention. 4)  You need to lose weight. Consider a lower calorie diet and regular exercise.  5)  Check your Blood Pressure regularly. If it is above: you should make an appointment. Prescriptions: ERGOCALCIFEROL 50000 UNIT CAPS (ERGOCALCIFEROL) take 1 capsule weekly for 8 weeks  #8 x 0   Entered and Authorized by:   Jason Coop MD   Signed by:   Jason Coop MD on 05/10/2010   Method used:   Print then Give to Patient   RxID:   [id number]    Prevention & Chronic Care Immunizations   Influenza vaccine: already had '09  (09/18/2008)   Influenza vaccine deferral: Refused  (10/05/2009)    Tetanus booster: Not documented   Td booster deferral: Deferred  (05/10/2010)    Pneumococcal vaccine: Not documented   Pneumococcal vaccine deferral: Deferred  (05/10/2010)  Other  Screening   Pap smear: Not documented   Pap smear action/deferral: Deferred  (05/10/2010)   Smoking status: never  (05/10/2010)  Diabetes Mellitus   HgbA1C: 7.0  (04/21/2010)    Eye exam: No diabetic retinopathy.     (04/21/2010)   Eye exam due: 04/2011    Foot exam: yes  (04/21/2010)   High risk foot: Not documented   Foot care education: Not documented    Urine microalbumin/creatinine ratio: Not documented    Diabetes flowsheet reviewed?: Yes   Progress toward A1C goal: At goal  Lipids   Total Cholesterol: 167  (04/21/2010)   LDL: 93  (04/21/2010)   LDL Direct: Not documented   HDL: 45  (04/21/2010)   Triglycerides: 143  (04/21/2010)  Hypertension   Last Blood Pressure: 129 / 93  (05/10/2010)   Serum creatinine: 0.77  (04/21/2010)   Serum potassium 4.2  (04/21/2010)    Hypertension flowsheet reviewed?: Yes   Progress toward BP goal: At goal  Self-Management Support :   Personal Goals (by the next clinic visit) :     Personal A1C goal: 7  (10/05/2009)     Personal blood pressure goal: 140/90  (10/05/2009)     Personal LDL goal: 70  (10/05/2009)    Patient will work on the following items until the next clinic visit to reach self-care goals:     Medications and monitoring: take my medicines every day, check  my blood sugar, bring all of my medications to every visit, examine my feet every day  (05/10/2010)     Eating: drink diet soda or water instead of juice or soda, eat more vegetables, use fresh or frozen vegetables, eat foods that are low in salt, eat baked foods instead of fried foods, eat fruit for snacks and desserts, limit or avoid alcohol  (05/10/2010)     Activity: take a 30 minute walk every day  (05/10/2010)    Diabetes self-management support: Written self-care plan  (05/10/2010)   Diabetes care plan printed    Hypertension self-management support: Written self-care plan  (05/10/2010)   Hypertension self-care plan printed.   Appended Document:  ACUTE-ER/FU-/cfb(GOLDING)/CFB We will have her loop recorder interrogated.

## 2011-01-10 NOTE — Progress Notes (Signed)
Summary: deposition/ hla  Phone Note Other Incoming   Summary of Call: rec'd call from jackie at  [employer], ph# [phone], jackie's direct# [phone],  they are requesting a deposition from dr [name] for the [employer], i spoke w/ dr [name], rtc to law firm that dr [name] would speak w/ [employer]. and then call them back w/in 2 wks. after speaking w/ jackie she states that they must have a date scheduled by 03/15/10 and deposition complete by 05/29/10. the release of information is on chart from [employer]. Initial call taken by: Marin Roberts RN,  March 01, 2010 10:47 AM  Follow-up for Phone Call        Spoke with Jerilynn Som in Risk Management. She is coordinating deposition details. Follow-up by: [name]  DO,  March 09, 2010 3:16 PM

## 2011-01-10 NOTE — Consult Note (Signed)
Summary: [facility]  [facility]   Imported By: Louretta Parma 08/24/2010 14:52:36  _____________________________________________________________________  External Attachment:    Type:   Image     Comment:   External Document

## 2011-01-10 NOTE — Cardiovascular Report (Signed)
Summary: Office Visit   Office Visit   Imported By: Roderic Ovens 01/28/2010 16:11:31  _____________________________________________________________________  External Attachment:    Type:   Image     Comment:   External Document

## 2011-01-10 NOTE — Progress Notes (Signed)
Summary: phone/gg  Phone Note Call from Patient   Reason for Call: Lab or Test Results Summary of Call: Pt called and states that Dr. Kathie Dike, the surgeon she saw at [facility] is requesting she have a mammogram done.  It is in his last note.  Will you order this for her?  Initial call taken by: Merrie Roof RN,  February 25, 2010 3:35 PM  Follow-up for Phone Call        ok will do! order entered and a signed copy is on teh triage office desk=thanks! Follow-up by: [name]  DO,  February 26, 2010 10:36 AM  Additional Follow-up for Phone Call Additional follow up Details #1::        Thank you, will have Venita Sheffield schedule Additional Follow-up by: Merrie Roof RN,  March 01, 2010 9:51 AM

## 2011-01-10 NOTE — Progress Notes (Signed)
Summary: appt/ hla  Phone Note Call from Patient   Summary of Call: pt calls to ask for abx, states she has had swollen eyes, drainage from her eyes, drainage in throat and neck feeling hot. she is informed she needs an appt, it is made. she is cautioned if she gets worse she may go to ED or urg care, verb understanding Initial call taken by: Marin Roberts RN,  August 08, 2010 4:53 PM  Follow-up for Phone Call        agree- needs to be seen either here or at urgent care- sounds like a sinus infection. Follow-up by: [name]  DO,  August 08, 2010 8:43 PM

## 2011-01-10 NOTE — Progress Notes (Signed)
Summary: refill/ hla  Phone Note Refill Request Message from:  Patient on October 17, 2010 4:44 PM  Refills Requested: Medication #1:  VICTOZA 18 MG/3ML SOLN inject 0.9mg  once a day as directed for blood sugar control   Dosage confirmed as above?Dosage Confirmed Initial call taken by: Marin Roberts RN,  October 17, 2010 4:45 PM  Follow-up for Phone Call        Refill approved-nurse to complete Follow-up by: [name]  DO,  October 19, 2010 1:36 PM    Prescriptions: VICTOZA 18 MG/3ML SOLN (LIRAGLUTIDE) inject 0.9mg  once a day as directed for blood sugar control  #1 month x 4   Entered and Authorized by:   [name]  DO   Signed by:   [name]  DO on 10/19/2010   Method used:   Electronically to        [facility] (retail)       [address]       [place], [place]  [address]       Ph: [phone]       Fax: [phone]   RxID:   [id number]

## 2011-01-10 NOTE — Assessment & Plan Note (Signed)
Summary: rash not better/pcp-Vernie Piet/hla   Vital Signs:  Patient profile:   40 year old female Height:      60 inches (152.40 cm) Weight:      195.9 pounds (89.05 kg) BMI:     38.40 O2 Sat:      98 % on Room air Temp:     98.7 degrees F (37.06 degrees C) oral Pulse rate:   83 / minute BP sitting:   146 / 97  (right arm)  Vitals Entered By: Chinita Pester RN (July 04, 2010 2:14 PM)  O2 Flow:  Room air  Serial Vital Signs/Assessments:  Time      Position  BP       Pulse  Resp  Temp     By 3:40 PM             169/110  93                    Glenda Palmer RN 4:08 PM             169/110  93                    Chinita Pester RN 4:09 PM             156/106  93                    Glenda Palmer RN                                PEF    PreRx  PostRx Time      O2 Sat  O2 Type     L/min  L/min  L/min   By 3:40 PM   98  %   Room air                          Chinita Pester RN  Comments: 4:09 PM 4:08 PM vitals error.  By: Chinita Pester RN  3:40PM CBG- 252 By: Chinita Pester RN   CC: Rash worse; red, itchy body rash. Is Patient Diabetic? Yes Did you bring your meter with you today? No Pain Assessment Patient in pain? no      Nutritional Status BMI of > 30 = obese CBG Result 256  Have you ever been in a relationship where you felt threatened, hurt or afraid?Unable to ask   Does patient need assistance? Functional Status Self care Ambulation Normal   Primary Care Provider:  [name]  DO  CC:  Rash worse; red and itchy body rash..  History of Present Illness: Sireen Halk is a 40 y/o woman with PMH/problems as outlined in EMR.  She comes to the clinic today with c/o  - Rash and itching- Since last month.                                It didn't seem to be any rash, but had scratch mark due to itching on forearm and abdomen. She says her itching and rash worsened from her last visit a week ago.   Denies any chest pain, headache, anorexia, wt loss, any vision  changes.  C/o fever, about 3 times in last month along with sweating at night, might be due to hypoglycemia. Will tell pt to check her  blood glucose when she has similar complaint again.   - Urinary incontinence- since a month.   Leaks urine while sneezing or coughing, enough to change her underwear.   Also has increased frequency of urination, 7-8 times a day and 3-4 times at night.   Denies any burning or pain during urination or any flank pain . She does have some suprapubic pain on palpation.  Syncope and collapse- Pt collapsed and fell after she got a Tdap shot in the room and before she was just about to leave. Pt slightly confused after the fall, CBG- 257 mg/dl, FY-101/751, probably high due to stress. Light reflex showed miosis after the fall.   Depression History:      The patient denies a depressed mood most of the day and a diminished interest in her usual daily activities.         Preventive Screening-Counseling & Management  Alcohol-Tobacco     Alcohol drinks/day: 0     Smoking Status: never     Smoking Cessation Counseling: yes     Passive Smoke Exposure: no  Caffeine-Diet-Exercise     Does Patient Exercise: no  Problems Prior to Update: 1)  Pruritus  (ICD-698.9) 2)  Pulmonary Embolism  (ICD-415.19) 3)  Urinary Frequency  (ICD-788.41) 4)  Hypothyroidism  (ICD-244.9) 5)  Vitamin D Deficiency  (ICD-268.9) 6)  Syncope and Collapse  (ICD-780.2) 7)  Fungal Dermatitis  (ICD-111.9) 8)  Mastodynia  (ICD-611.71) 9)  Iliotibial Band Syndrome, Bilateral  (ICD-728.89) 10)  Depression  (ICD-311) 11)  Anemia  (ICD-285.9) 12)  Hypertension  (ICD-401.9) 13)  Amenorrhea  (ICD-626.0) 14)  Weight Gain  (ICD-783.1) 15)  Pulmonary Embolism, Hx of  (ICD-V12.51) 16)  Diabetes Mellitus, Type II  (ICD-250.00) 17)  Venous Insufficiency  (ICD-459.81) 18)  Chest Wall Pain, Anterior  (ICD-786.52) 19)  Hypersomnia With Sleep Apnea Unspecified  (ICD-780.53) 20)  Family History Diabetes  1st Degree Relative  (ICD-V18.0) 21)  Family History of Cad Female 1st Degree Relative <60  (ICD-V16.49) 22)  Neuropathy  (ICD-355.9) 23)  Asthma  (ICD-493.90)  Medications Prior to Update: 1)  Albuterol 90 Mcg/act  Aers (Albuterol) .Marland Kitchen.. 1-2 Puffs Every 4-6 Hours As Needed For Wheezing or Shortness of Breath 2)  Victoza 18 Mg/64ml Soln (Liraglutide) .... Inject 0.6mg  Once A Day For 1 Week At The Same Time Each Day, Then Increase To 1.2mg   The Second Week and Thereafter 3)  Easy Touch Pen Needles 31g X 5 Mm Misc (Insulin Pen Needle) .... Use To Inject Victoza One A Day At The Same Time 4)  Lisinopril 5 Mg Tabs (Lisinopril) .... One By Mouth Daily 5)  Aspir-Low 81 Mg Tbec (Aspirin) .... Take 1 Tablet By Mouth Once A Day 6)  Ergocalciferol 50000 Unit Caps (Ergocalciferol) .... Take 1 Capsule Weekly For 8 Weeks 7)  Lotrisone 1-0.05 % Crea (Clotrimazole-Betamethasone) .... Aply Once Daily As Directed  Current Medications (verified): 1)  Albuterol 90 Mcg/act  Aers (Albuterol) .Marland Kitchen.. 1-2 Puffs Every 4-6 Hours As Needed For Wheezing or Shortness of Breath 2)  Victoza 18 Mg/74ml Soln (Liraglutide) .... Inject 0.6mg  Once A Day For 1 Week At The Same Time Each Day, Then Increase To 1.2mg   The Second Week and Thereafter 3)  Easy Touch Pen Needles 31g X 5 Mm Misc (Insulin Pen Needle) .... Use To Inject Victoza One A Day At The Same Time 4)  Lisinopril 5 Mg Tabs (Lisinopril) .... One By Mouth Daily 5)  Aspir-Low 81 Mg Tbec (Aspirin) .Marland KitchenMarland KitchenMarland Kitchen  Take 1 Tablet By Mouth Once A Day 6)  Ergocalciferol 50000 Unit Caps (Ergocalciferol) .... Take 1 Capsule Weekly For 8 Weeks 7)  Lotrisone 1-0.05 % Crea (Clotrimazole-Betamethasone) .... Aply Once Daily As Directed 8)  Hydroxyzine Hcl 25 Mg Tabs (Hydroxyzine Hcl) .... Take 1 Tablet By Mouth Two Times A Day For Itching  Allergies (verified): 1)  ! * Vicodan 2)  ! Percocet  Review of Systems       as per HPI.Marland Kitchen  Physical Exam  General:  alert, well-developed, and  well-nourished.   Head:  normocephalic and atraumatic.   Eyes:  miosis after syncope and collapse, vision grossly intact.   Neck:  supple.  full ROM and no masses.   Lungs:  normal breath sounds, no crackles, and no wheezes.   Abdomen:  soft, normal bowel sounds, no distention, and no masses.   Some suprapubic pain to palpation No flank pain or tenderness Msk:  no joint tenderness and no joint swelling.   Pulses:  R radial normal.   Extremities:  no edema, redness or trauma   Impression & Recommendations:  Problem # 1:  PRURITUS (ICD-698.9) She complained of itching since a month as per HPI. Gave her Hydroxyzine 25 mg two times a day and explained her about associated sleepiness. Will reassess at next visit.  Problem # 2:  URINARY FREQUENCY (ICD-788.41) As per HPI, she c/o urinary frequency and incontinence since  month. Her urine dipstick showed moderate RBC as she was menstruating, but no leukocytes. So she doesn't seem to have an UTI.  Still got an U/A.  Orders: T-Urinalysis Dipstick only ([id number]) T-Urinalysis ([id number])  Problem # 3:  SYNCOPE AND COLLAPSE (ICD-780.2) Pt collapsed and fell after she got a Tdap shot in the room and before she was just about to leave. Pt slightly confused after the fall, CBG- 257 mg/dl, GM-010/272, probably high due to stress. Light reflex showed miosis after the fall. So took UDS.  Also did D-dimer and hypercoagulability profile as she has Hx of PE. Strongly considering Absence Seizure-Atypica Migraine or Psuedo-Seizure. Unclear etiology. Will contact Cards to see if ipanted loop recorder still functional.  Problem # 4:  HYPERTENSION (ICD-401.9) Her BP deteriorated since last visit and got even worse after her fall which was 169/110 mm Hg, probably due to stress hormone release due to her syncope and collapse. Will recheck her BP at next visit. Her updated medication list for this problem includes:    Lisinopril 5 Mg Tabs (Lisinopril) .....  One by mouth daily  Orders: T-Drug Screen-Urine, (single) [phone])  BP today: 146/97 Prior BP: 124/91 (05/10/2010)  Labs Reviewed: K+: 4.2 (04/21/2010) Creat: : 0.77 (04/21/2010)   Chol: 167 (04/21/2010)   HDL: 45 (04/21/2010)   LDL: 93 (04/21/2010)   TG: 143 (04/21/2010)  Complete Medication List: 1)  Albuterol 90 Mcg/act Aers (Albuterol) .Marland Kitchen.. 1-2 puffs every 4-6 hours as needed for wheezing or shortness of breath 2)  Victoza 18 Mg/28ml Soln (Liraglutide) .... Inject 0.6mg  once a day for 1 week at the same time each day, then increase to 1.2mg   the second week and thereafter 3)  Easy Touch Pen Needles 31g X 5 Mm Misc (Insulin pen needle) .... Use to inject victoza one a day at the same time 4)  Lisinopril 5 Mg Tabs (Lisinopril) .... One by mouth daily 5)  Aspir-low 81 Mg Tbec (Aspirin) .... Take 1 tablet by mouth once a day 6)  Ergocalciferol 50000 Unit Caps (Ergocalciferol) .... Take 1  capsule weekly for 8 weeks 7)  Lotrisone 1-0.05 % Crea (Clotrimazole-betamethasone) .... Aply once daily as directed 8)  Hydroxyzine Hcl 25 Mg Tabs (Hydroxyzine hcl) .... Take 1 tablet by mouth two times a day for itching  Other Orders: Capillary Blood Glucose/CBG (16109) T-Urine Microalbumin w/creat. ratio [id number]) T-Comprehensive Metabolic Panel [id number]) T-CBC w/Diff [id number]) Tdap => 33yrs IM (52841) Admin 1st Vaccine (32440) Admin 1st Vaccine [facility]) [phone]) T-D-[facility]) [phone]) T- * Misc. Laboratory test [id number]) T-Urine Microalbumin w/creat. ratio [id number])  Patient Instructions: 1)  Please schedule a follow-up appointment in 2 months. 2)  Please take Hydroxizine 25 mg twice a day for your itching and rash. It will make you sleepy. 3)  If the rash worsens, give [place] a call. 4)  Will give you a call if something is wrong with your lab results. Prescriptions: HYDROXYZINE HCL 25 MG TABS (HYDROXYZINE HCL) Take 1 tablet by mouth two times a day for  itching  #60 x 1   Entered and Authorized by:   Lyn Hollingshead   Signed by:   Lyn Hollingshead on 07/04/2010   Method used:   Electronically to        [facility] (retail)       [address]       [place], [place]  [address]       Ph: [phone]       Fax: [phone]   RxID:   [id number]   Prevention & Chronic Care Immunizations   Influenza vaccine: already had '09  (09/18/2008)   Influenza vaccine deferral: Refused  (10/05/2009)    Tetanus booster: 07/04/2010: Tdap   Td booster deferral: Deferred  (05/10/2010)    Pneumococcal vaccine: Not documented   Pneumococcal vaccine deferral: Deferred  (05/10/2010)  Other Screening   Pap smear: Not documented   Pap smear action/deferral: Deferred  (05/10/2010)   Smoking status: never  (07/04/2010)  Diabetes Mellitus   HgbA1C: 7.0  (04/21/2010)    Eye exam: No diabetic retinopathy.     (04/21/2010)   Eye exam due: 04/2011    Foot exam: yes  (04/21/2010)   High risk foot: Not documented   Foot care education: Not documented    Urine microalbumin/creatinine ratio: Not documented   Urine microalbumin action/deferral: Ordered    Diabetes flowsheet reviewed?: Yes   Progress toward A1C goal: Unchanged  Lipids   Total Cholesterol: 167  (04/21/2010)   LDL: 93  (04/21/2010)   LDL Direct: Not documented   HDL: 45  (04/21/2010)   Triglycerides: 143  (04/21/2010)  Hypertension   Last Blood Pressure: 146 / 97  (07/04/2010)   Serum creatinine: 0.77  (04/21/2010)   Serum potassium 4.2  (04/21/2010) CMP ordered     Hypertension flowsheet reviewed?: Yes   Progress toward BP goal: Deteriorated  Self-Management Support :   Personal Goals (by the next clinic visit) :     Personal A1C goal: 7  (10/05/2009)     Personal blood pressure goal: 140/90  (10/05/2009)     Personal LDL goal: 70  (10/05/2009)    Patient will work on the following items until the next clinic visit to reach self-care goals:      Medications and monitoring: check my blood pressure, bring all of my medications to every visit, examine my feet every day  (07/04/2010)     Eating: eat more vegetables, use fresh or frozen vegetables, eat foods that are low  in salt, eat baked foods instead of fried foods, eat fruit for snacks and desserts  (07/04/2010)     Activity: take a 30 minute walk every day  (07/04/2010)    Diabetes self-management support: Education handout, Written self-care plan  (07/04/2010)   Diabetes care plan printed   Diabetes education handout printed    Hypertension self-management support: Education handout, Written self-care plan  (07/04/2010)   Hypertension self-care plan printed.   Hypertension education handout printed   Nursing Instructions: Give tetanus booster today   Process Orders Check Orders Results:     [facility]: Order checked:     [phone] -- T- * Misc. Laboratory test -- No CPT codes found (CPT: ) Tests Sent for requisitioning (July 11, 2010 4:07 PM):     07/04/2010: [facility] -- T-Urinalysis [[id number]] (signed)     07/04/2010: [facility] -- T-Urine Microalbumin w/creat. ratio [[id number]] (signed)     07/04/2010: [facility] -- T-Comprehensive Metabolic Panel [[id number]] (signed)     07/04/2010: [facility] -- T-CBC w/Diff [[id number]] (signed)     07/04/2010: [facility] -- T-Drug Screen-Urine, (single) [[id number]] (signed)     07/04/2010: [facility] -- T- * Misc. Laboratory test [phone] (signed)     07/04/2010: [facility] -- T-Urine Microalbumin w/creat. ratio [[id number]] (signed)     Tetanus/Td Vaccine    Vaccine Type: Tdap    Site: left deltoid    Mfr: GlaxoSmithKline    Dose: 0.5 ml    Route: IM    Given by: Chinita Pester RN    Exp. Date: 06/09/2012    Lot #: [id number]    VIS given: 10/29/07 version given July 04, 2010.   Laboratory Results   Urine Tests  Date/Time Recieved: 07/04/10 3:30PM Date/Time Reported: 07/04/10  3:30PM  Routine Urinalysis   Color: lt. yellow Glucose: negative   (Normal Range: Negative) Bilirubin: negative   (Normal Range: Negative) Ketone: negative   (Normal Range: Negative) Spec. Gravity: >=1.030   (Normal Range: 1.003-1.035) Blood: moderate   (Normal Range: Negative) pH: 5.0   (Normal Range: 5.0-8.0) Protein: negative   (Normal Range: Negative) Urobilinogen: 0.2   (Normal Range: 0-1) Nitrite: negative   (Normal Range: Negative) Leukocyte Esterace: negative   (Normal Range: Negative)     Blood Tests     CBG Random: 256      Appended Document: rash not better/pcp-Emori Kamau/hla I have discussed the care of this patient in detail with the resident and agree fully with the documentation completed.  I was present when teh patient collapsed and evaluated her completely. Vitals are stable. Hx of collapse prior to PE. Strong Fam hx of similar conditions. Need to refer back to neurology- she may benefit from a seizure monitoring unit or a trial of anti-epileptic medication.

## 2011-01-10 NOTE — Cardiovascular Report (Signed)
Summary: Pre-Op Orders  Pre-Op Orders   Imported By: Marylou Mccoy 12/23/2009 13:59:41  _____________________________________________________________________  External Attachment:    Type:   Image     Comment:   External Document

## 2011-01-10 NOTE — Miscellaneous (Signed)
Summary: [facility]  [facility]   Imported By: Florinda Marker 02/10/2010 08:58:23  _____________________________________________________________________  External Attachment:    Type:   Image     Comment:   External Document

## 2011-01-10 NOTE — Assessment & Plan Note (Signed)
Summary: poss sinus infection/pcp-golding/hla   Vital Signs:  Patient profile:   40 year old female Height:      60 inches (152.40 cm) Weight:      192.0 pounds (87.27 kg) BMI:     37.63 Temp:     98.0 degrees F (36.67 degrees C) oral Pulse rate:   77 / minute BP sitting:   127 / 87  (right arm)  Vitals Entered By: Stanton Kidney Ditzler RN (August 10, 2010 10:21 AM) CC: sore throat Is Patient Diabetic? Yes Did you bring your meter with you today? No Pain Assessment Patient in pain? no      Nutritional Status BMI of > 30 = obese Nutritional Status Detail appetite good CBG Result 243  Have you ever been in a relationship where you felt threatened, hurt or afraid?denies   Does patient need assistance? Functional Status Self care Ambulation Normal Comments Boyfriend with pt. Left ear burning and left eye swollen. Some sinus drainage.   Primary Care Provider:  [name]  DO  CC:  sore throat.  History of Present Illness: Makayla Frazier is a 40 y/o woman with PMH/problems as outlined in EMR.  She comes to the clinic today with c/o  Left ear burning and left eye swollen. Some sinus drainage, ongoing for 3 days, pt denies fever, chills, while taking to the patient i did not see any facial asymmerty or swelling.   DM: pt was seen by pcp 3 weeks ago, and is doing well with DM.  Patient is feeling well and denies CP, abdominal pain, nausea, vomiting, HA's, palpitations, blurred vision. fever, chills, diarrhea, constipation or SOB.   Depression History:      The patient denies a depressed mood most of the day and a diminished interest in her usual daily activities.         Preventive Screening-Counseling & Management  Alcohol-Tobacco     Alcohol drinks/day: 0     Smoking Status: never     Smoking Cessation Counseling: yes     Passive Smoke Exposure: no  Caffeine-Diet-Exercise     Does Patient Exercise: no     Times/week: 4-5  Current Medications (verified): 1)   Albuterol 90 Mcg/act  Aers (Albuterol) .Marland Kitchen.. 1-2 Puffs Every 4-6 Hours As Needed For Wheezing or Shortness of Breath 2)  Victoza 18 Mg/39ml Soln (Liraglutide) .... Inject 0.6mg  Once A Day For 1 Week At The Same Time Each Day, Then Increase To 1.2mg   The Second Week and Thereafter 3)  Easy Touch Pen Needles 31g X 5 Mm Misc (Insulin Pen Needle) .... Use To Inject Victoza One A Day At The Same Time 4)  Lisinopril 5 Mg Tabs (Lisinopril) .... One By Mouth Daily 5)  Aspir-Low 81 Mg Tbec (Aspirin) .... Take 1 Tablet By Mouth Once A Day 6)  Ergocalciferol 50000 Unit Caps (Ergocalciferol) .... Take 1 Capsule Weekly For 8 Weeks 7)  Lotrisone 1-0.05 % Crea (Clotrimazole-Betamethasone) .... Aply Once Daily As Directed 8)  Hydroxyzine Hcl 25 Mg Tabs (Hydroxyzine Hcl) .... Take 1 Tablet By Mouth Two Times A Day For Itching  Allergies: 1)  ! * Vicodan 2)  ! Percocet  Review of Systems       As per HPI  Physical Exam  General:  alert, well-developed, and well-nourished.   Head:  normocephalic and atraumatic.   Eyes:  vision grossly intact.   Ears:  R ear normal and L ear normal.   Nose:  no external deformity and no  nasal discharge.   Mouth:  pharynx pink and moist.  pharyngeal erythema, mild. Lungs:  normal breath sounds, no crackles, and no wheezes.   Heart:  normal rate, regular rhythm, no murmur, no gallop, and no rub.     Impression & Recommendations:  Problem # 1:  SORE THROAT (ICD-462) Assessment New insidious onset of sore throat over 4 days,  No tonsillar exudate, no tender cervical adenitis, and no fever. No history of GAS, and with no systemic symptoms, this is likely to be a common viral pharyngitis.  Adults with fewer than two Centor criteria (tonsillar exudates, tender anterior cervical adenopathy, history of fever, absence of cough) should not receive either antibiotic treatment or diagnostic testing. Therefore no indication for antibiotics at this time. will treat symptomatically and  reevaluate.   Her ears were normal, as for burning this may be related to DM neuropathy.   Her updated medication list for this problem includes:    Aspir-low 81 Mg Tbec (Aspirin) .Marland Kitchen... Take 1 tablet by mouth once a day  Problem # 2:  DIABETES MELLITUS, TYPE II (ICD-250.00) Assessment: Comment Only well controlled, managed by PCP and will follow with Dr. [name] in Nov. for f/u.   Her updated medication list for this problem includes:    Victoza 18 Mg/51ml Soln (Liraglutide) ..... Inject 0.6mg  once a day for 1 week at the same time each day, then increase to 1.2mg   the second week and thereafter    Lisinopril 5 Mg Tabs (Lisinopril) ..... One by mouth daily    Aspir-low 81 Mg Tbec (Aspirin) .Marland Kitchen... Take 1 tablet by mouth once a day  Orders: Capillary Blood Glucose/CBG (16109)  Complete Medication List: 1)  Albuterol 90 Mcg/act Aers (Albuterol) .Marland Kitchen.. 1-2 puffs every 4-6 hours as needed for wheezing or shortness of breath 2)  Victoza 18 Mg/84ml Soln (Liraglutide) .... Inject 0.6mg  once a day for 1 week at the same time each day, then increase to 1.2mg   the second week and thereafter 3)  Easy Touch Pen Needles 31g X 5 Mm Misc (Insulin pen needle) .... Use to inject victoza one a day at the same time 4)  Lisinopril 5 Mg Tabs (Lisinopril) .... One by mouth daily 5)  Aspir-low 81 Mg Tbec (Aspirin) .... Take 1 tablet by mouth once a day 6)  Ergocalciferol 50000 Unit Caps (Ergocalciferol) .... Take 1 capsule weekly for 8 weeks 7)  Lotrisone 1-0.05 % Crea (Clotrimazole-betamethasone) .... Aply once daily as directed 8)  Hydroxyzine Hcl 25 Mg Tabs (Hydroxyzine hcl) .... Take 1 tablet by mouth two times a day for itching  Patient Instructions: 1)  Please schedule an appointment with your primary doctor in november.   Prevention & Chronic Care Immunizations   Influenza vaccine: already had '09  (09/18/2008)   Influenza vaccine deferral: Refused  (08/10/2010)    Tetanus booster: 07/04/2010: Tdap    Td booster deferral: Deferred  (05/10/2010)    Pneumococcal vaccine: Not documented   Pneumococcal vaccine deferral: Deferred  (05/10/2010)  Other Screening   Pap smear: Not documented   Pap smear action/deferral: Deferred  (05/10/2010)   Smoking status: never  (08/10/2010)  Diabetes Mellitus   HgbA1C: 7.7  (06/30/2010)    Eye exam: No diabetic retinopathy.     (04/21/2010)   Eye exam due: 04/2011    Foot exam: yes  (04/21/2010)   High risk foot: Not documented   Foot care education: Not documented    Urine microalbumin/creatinine ratio: 4.7  (07/04/2010)  Urine microalbumin action/deferral: Ordered  Lipids   Total Cholesterol: 167  (04/21/2010)   LDL: 93  (04/21/2010)   LDL Direct: Not documented   HDL: 45  (04/21/2010)   Triglycerides: 143  (04/21/2010)  Hypertension   Last Blood Pressure: 127 / 87  (08/10/2010)   Serum creatinine: 0.69  (07/04/2010)   Serum potassium 3.8  (07/04/2010)  Self-Management Support :   Personal Goals (by the next clinic visit) :     Personal A1C goal: 7  (10/05/2009)     Personal blood pressure goal: 140/90  (10/05/2009)     Personal LDL goal: 70  (10/05/2009)    Patient will work on the following items until the next clinic visit to reach self-care goals:     Medications and monitoring: take my medicines every day, bring all of my medications to every visit, examine my feet every day  (08/10/2010)     Eating: eat more vegetables, use fresh or frozen vegetables, eat foods that are low in salt, eat baked foods instead of fried foods, eat fruit for snacks and desserts  (08/10/2010)     Activity: take a 30 minute walk every day  (08/10/2010)    Diabetes self-management support: Written self-care plan, Education handout, Resources for patients handout  (08/10/2010)   Diabetes care plan printed   Diabetes education handout printed    Hypertension self-management support: Written self-care plan, Education handout, Resources for patients  handout  (08/10/2010)   Hypertension self-care plan printed.   Hypertension education handout printed      Resource handout printed.

## 2011-01-10 NOTE — Progress Notes (Signed)
  Phone Note Other Incoming   Summary of Call: Patient's chart has been requested by [employer].  All paper vols. requested.  Request forwarded to [employer] on 01/10/10.

## 2011-01-10 NOTE — Progress Notes (Signed)
Summary: pr auth questions./ hla  Phone Note Outgoing Call   Summary of Call: i spoke w/ pt today she states she has not called today and the prior auth was explained to her previously, she states she has no questions. Initial call taken by: Marin Roberts RN,  October 25, 2010 4:01 PM

## 2011-01-10 NOTE — Assessment & Plan Note (Signed)
Summary: EST-CK/FU/MEDS/CFB   Vital Signs:  Patient profile:   40 year old female Height:      60 inches (152.40 cm) Weight:      199.02 pounds (90.46 kg) BMI:     39.01 O2 Sat:      98 % on Room air Temp:     98.1 degrees F (36.72 degrees C) oral Pulse rate:   84 / minute BP sitting:   151 / 92  (right arm)  Vitals Entered By: Makayla Ok RN (December 30, 2009 10:06 AM)  O2 Flow:  Room air Is Patient Diabetic? Yes Did you bring your meter with you today? No Pain Assessment Patient in pain? no      Nutritional Status BMI of > 30 = obese CBG Result 164  Have you ever been in a relationship where you felt threatened, hurt or afraid?No   Does patient need assistance? Functional Status Self care Ambulation Normal Comments Bunion on left foot.  Causes pain when wearing shoes.  Gets irritated x 1 month.  Needs refills.   Primary Care Provider:  Loreen Freud, MD   History of Present Illness: Makayla Frazier comes in for HFU, after being admitted by teh [facility]. She left our clinic for several months and was seen by Makayla Frazier, a primary care phisician locally. She now wishes to return to [facility] for her primary care. Makayla Frazier's Dm is much worse since she left teh clinic. She still has issues with unexplained syncope. She has an implanted event monitor which has to date been negative. she does have a history of episodes of hypoglycemia while on insulin. Metformin is making her nauseated. Patient does not want to take pills- she is asking about alternatives today. She is asking about teh results of her MRI-these are not availablke on e-chart so I am requesting report form [facility]. She still has chronic chest pain with multiple ED visits.  Depression History:      The patient denies a depressed mood most of the day and a diminished interest in her usual daily activities.        The patient denies that she feels like life is not worth living, denies that she wishes that she were dead, and denies  that she has thought about ending her life.         Preventive Screening-Counseling & Management  Alcohol-Tobacco     Smoking Status: never     Smoking Cessation Counseling: yes     Passive Smoke Exposure: no  Current Medications (verified): 1)  Albuterol 90 Mcg/act  Aers (Albuterol) .Marland Kitchen.. 1-2 Puffs Every 4-6 Hours As Needed For Wheezing or Shortness of Breath 2)  Prinzide 10-12.5 Mg Tabs (Lisinopril-Hydrochlorothiazide) .... Take 1 Tablet By Mouth Once A Day 3)  Victoza 18 Mg/29ml Soln (Liraglutide) .... Inject 0.6mg  Once A Day For 1 Week At The Same Time Each Day, Then Increase To 1.2mg   The Second Week and Thereafter 4)  Easy Touch Pen Needles 31g X 5 Mm Misc (Insulin Pen Needle) .... Use To Inject Victoza One A Day At The Same Time 5)  Zithromax Z-Pak 250 Mg Tabs (Azithromycin) .... Take As Directed. One Tab By Mouth Daily For 5 Days.  Allergies (verified): 1)  ! * Vicodan 2)  ! Percocet  Review of Systems       The patient complains of chest pain and syncope.  The patient denies fever, weight loss, hoarseness, peripheral edema, prolonged cough, headaches, hemoptysis, abdominal pain, severe indigestion/heartburn, muscle weakness, transient  blindness, difficulty walking, and depression.    Physical Exam  General:  alert, well-developed, and well-nourished.   Chest Wall:  tenderness along pect muscles in chest. Lungs:  normal respiratory effort, normal breath sounds, no crackles, and no wheezes.   Heart:  normal rate, regular rhythm, and no murmur.   Abdomen:  soft, non-tender, and normal bowel sounds.   Skin:  red weepy rash /fungal infection under each breast.   Impression & Recommendations:  Problem # 1:  HYPERTENSION (ICD-401.9) ELevated BP today. I have added ACE and a diurectic combo today. Will follow labs in 1 month.  Her updated medication list for this problem includes:    Prinzide 10-12.5 Mg Tabs (Lisinopril-hydrochlorothiazide) .Marland Kitchen... Take 1 tablet by mouth once a  day  BP today: 151/92 Prior BP: 143/98 (10/18/2009)  Labs Reviewed: K+: 3.5 (10/18/2009) Creat: : 0.8 (10/18/2009)     Problem # 2:  SYNCOPE AND COLLAPSE (ICD-780.2) Still unclear etiology. Await MRI results. Event monitor neg for arrythmia. Will have her check CBGs, question if related to seizure-neuro has evaled.  Orders: T-Comprehensive Metabolic Panel [id number]) T-CBC w/Diff ([id number])  Problem # 3:  MASTODYNIA (ICD-611.71) This is the cause of her chest pain. CT angio was negative for any residual PE. She needs breast reduction surgery- I feel strongly that this is medically necessary. She has recurrent ED visits for chest pain and even admissions. We need to expedite this process for her if possibe. Will need to see if she meets coverage criteria and refer her to a surgeon.   Problem # 4:  DIABETES MELLITUS, TYPE II (ICD-250.00) A1C is elevated. She would be a good candidate for Victoza- will start as described below. DR to assist patient with education.  The following medications were removed from the medication list:    Metformin Hcl 1000 Mg Tabs (Metformin hcl) .Marland Kitchen... Take 1 pill by mouth two times a day. Her updated medication list for this problem includes:    Prinzide 10-12.5 Mg Tabs (Lisinopril-hydrochlorothiazide) .Marland Kitchen... Take 1 tablet by mouth once a day    Victoza 18 Mg/73ml Soln (Liraglutide) ..... Inject 0.6mg  once a day for 1 week at the same time each day, then increase to 1.2mg   the second week and thereafter  Orders: T- Capillary Blood Glucose (30865) T-Hgb A1C (in-house) (78469GE)  Labs Reviewed: Creat: 0.8 (10/18/2009)    Reviewed HgBA1c results: 7.7 (12/30/2009)  7.8 (10/05/2009)  Complete Medication List: 1)  Albuterol 90 Mcg/act Aers (Albuterol) .Marland Kitchen.. 1-2 puffs every 4-6 hours as needed for wheezing or shortness of breath 2)  Prinzide 10-12.5 Mg Tabs (Lisinopril-hydrochlorothiazide) .... Take 1 tablet by mouth once a day 3)  Victoza 18 Mg/96ml Soln  (Liraglutide) .... Inject 0.6mg  once a day for 1 week at the same time each day, then increase to 1.2mg   the second week and thereafter 4)  Easy Touch Pen Needles 31g X 5 Mm Misc (Insulin pen needle) .... Use to inject victoza one a day at the same time 5)  Zithromax Z-pak 250 Mg Tabs (Azithromycin) .... Take as directed. one tab by mouth daily for 5 days.  Patient Instructions: 1)  Please schedule a follow-up appointment in apx 3 weeks w/ [name]. add on Frazier. Prescriptions: VICTOZA 18 MG/3ML SOLN (LIRAGLUTIDE) inject 0.6mg  once a day for 1 week at the same time each day, then increase to 1.2mg   the second week and thereafter  #2 boxes x 1   Entered and Authorized by:   [name]  DO  Signed by:   [name]  DO on 12/30/2009   Method used:   Electronically to        [id number]* (retail)       [address] [place], [place]  [address]       Ph: [phone]       Fax: [phone]   RxID:   [id number] ZITHROMAX Z-PAK 250 MG TABS (AZITHROMYCIN) Take as directed. One tab by mouth daily for 5 days.  #5 x 0   Entered and Authorized by:   [name]  DO   Signed by:   [name]  DO on 12/30/2009   Method used:   Electronically to        [id number]* (retail)       [address] [place], [place]  [address]       Ph: [phone]       Fax: [phone]   RxID:   [id number] EASY TOUCH PEN NEEDLES 31G X 5 MM MISC (INSULIN PEN NEEDLE) use to inject victoza one a day at the same time  #100 x 2   Entered by:   Jamison Neighbor RD,CDE   Authorized by:   [name]  DO   Signed by:   [name]  DO on 12/30/2009   Method used:   Electronically to        [id number]* (retail)       [address] [place], [place]  [address]       Ph: [phone]       Fax: [phone]   RxID:   [id number] PRINZIDE 10-12.5 MG TABS (LISINOPRIL-HYDROCHLOROTHIAZIDE) Take 1 tablet by mouth once a day  #30 x 3   Entered and  Authorized by:   [name]  DO   Signed by:   [name]  DO on 12/30/2009   Method used:   Electronically to        [id number]* (retail)       [address] [place], [place]  [address]       Ph: [phone]       Fax: [phone]   RxID:   [id number]   Prevention & Chronic Care Immunizations   Influenza vaccine: already had '09  (09/18/2008)   Influenza vaccine deferral: Refused  (10/05/2009)    Tetanus booster: Not documented    Pneumococcal vaccine: Not documented  Other Screening   Pap smear: Not documented   Smoking status: never  (12/30/2009)  Diabetes Mellitus   HgbA1C: 7.7  (12/30/2009)    Eye exam: Not documented    Foot exam: yes  (08/03/2008)   High risk foot: Not documented   Foot care education: Not documented    Urine microalbumin/creatinine ratio: Not documented  Lipids   Total Cholesterol: Not documented   LDL: Not documented   LDL Direct: Not documented   HDL: Not documented   Triglycerides: Not documented  Hypertension   Last Blood Pressure: 151 / 92  (12/30/2009)   Serum creatinine: 0.8  (10/18/2009)   Serum potassium 3.5  (10/18/2009) CMP ordered   Self-Management Support :   Personal Goals (by the next clinic visit) :     Personal A1C goal: 7  (10/05/2009)     Personal blood pressure goal: 140/90  (10/05/2009)  Personal LDL goal: 70  (10/05/2009)    Patient will work on the following items until the next clinic visit to reach self-care goals:     Medications and monitoring: take my medicines every day, check my blood sugar, bring all of my medications to every visit, examine my feet every day  (12/30/2009)     Eating: drink diet soda or water instead of juice or soda, eat more vegetables, eat foods that are low in salt, eat baked foods instead of fried foods  (12/30/2009)     Activity: take a 30 minute walk every day  (12/30/2009)    Diabetes self-management support: Copy of home glucose meter  record, Written self-care plan  (10/05/2009)    Hypertension self-management support: Written self-care plan  (10/05/2009)  Process Orders Check Orders Results:     [facility]: ABN not required for this insurance Tests Sent for requisitioning (January 04, 2010 11:33 AM):     12/30/2009: [facility] -- T-Comprehensive Metabolic Panel [[id number]] (signed)     12/30/2009: [facility] -- [facility] w/Diff [[id number]] (signed)    Laboratory Results   Blood Tests   Date/Time Received: December 30, 2009 10:23 AM  Date/Time Reported: Burke Keels  December 30, 2009 10:24 AM   HGBA1C: 7.7%   (Normal Range: Non-Diabetic - 3-6%   Control Diabetic - 6-8%) CBG Random:: 164mg /dL

## 2011-01-10 NOTE — Progress Notes (Signed)
Summary: Appointment  Phone Note From Other Clinic   Caller: Nurse Summary of Call: RTC to [facility] from [facility].  Annice Pih had called saying that p can be scheduled for an appointment with Dr. Kathie Dike.  Message left on Jackie's voice mail to call with the appointment on 02/17/2010. Angelina Ok RN  February 16, 2010 12:07 PM  Initial call taken by: Angelina Ok RN,  February 16, 2010 12:07 PM  Follow-up for Phone Call        RTC to

## 2011-01-10 NOTE — Progress Notes (Signed)
Summary: Mammogram  Phone Note Outgoing Call   Call placed by: Angelina Ok RN,  March 01, 2010 10:13 AM Call placed to: Specialist Summary of Call: Cal to [facility] to schedule the Diagnostic Mammogram.  Pt was last done at the [facility].  Call to the Breast Center schedular asked about site of pain in pt's breast.  Said that the Diagnostic centralizes on a particular area.  Said that pt would probably benefit from just a Screening Mammogram.  Call to pt said that she is not having any breast problems or pain.  Said that Dr. Kathie Dike wanted to make sure that she is not having any problems before the Breast Reduction surgery.  Pt has been scheduled for a screening Mammogram for 03/10/2010 at 1:20 PM to arrive by 1:05 PM.  Will contact Dr. [name] to see what she would like to order. Angelina Ok RN  March 01, 2010 10:36 AM  Initial call taken by: Angelina Ok RN,  March 01, 2010 10:16 AM  Follow-up for Phone Call        Screening mammogram is fine. Follow-up by: [name]  DO,  March 09, 2010 3:16 PM

## 2011-01-10 NOTE — Cardiovascular Report (Signed)
Summary: Office Visit   Office Visit   Imported By: Roderic Ovens 02/18/2010 10:59:05  _____________________________________________________________________  External Attachment:    Type:   Image     Comment:   External Document

## 2011-01-10 NOTE — Procedures (Signed)
Summary: ILR--PT HAD EPISODE 01/16/10 FAST HEART RATE? IF ILR IS WORKING   Current Medications (verified): 1)  Albuterol 90 Mcg/act  Aers (Albuterol) .Marland Kitchen.. 1-2 Puffs Every 4-6 Hours As Needed For Wheezing or Shortness of Breath 2)  Victoza 18 Mg/7ml Soln (Liraglutide) .... Inject 0.6mg  Once A Day For 1 Week At The Same Time Each Day, Then Increase To 1.2mg   The Second Week and Thereafter 3)  Easy Touch Pen Needles 31g X 5 Mm Misc (Insulin Pen Needle) .... Use To Inject Victoza One A Day At The Same Time 4)  Lisinopril 5 Mg Tabs (Lisinopril) .... One By Mouth Daily  Allergies (verified): 1)  ! * Vicodan 2)  ! Percocet   ILR Following MD Hillis Range, MD DOI:  10/19/2009 Vendor:  [employer]     Model Number:  9529     Serial Number [id number]       Tachy Episodes:  0     Brady Episodes:  0 ILR Next Due 04/10/2010  Tech Comments:  The patient was seen today for an episode of heart racing 01/16/2010.  She attempted to make a recording but stated it wouldn't let her.  I tested the device today and was able to record twice.  There were 5 asystole episodes recorded that were undersensing and noise.  ROV 3 months Dr. Johney Frame. Altha Harm, LPN  January 17, 2010 4:00 PM

## 2011-01-10 NOTE — Letter (Signed)
Summary: [facility] RX Regimen  [employer] [facility] [employer] [facility] RX Regimen   Imported By: Florinda Marker 12/31/2009 16:29:50  _____________________________________________________________________  External Attachment:    Type:   Image     Comment:   External Document

## 2011-01-10 NOTE — Consult Note (Signed)
Summary: [facility]   Imported By: Florinda Marker 01/19/2010 13:59:20  _____________________________________________________________________  External Attachment:    Type:   Image     Comment:   External Document  Appended Document: Makayla Frazier    Clinical Lists Changes  Observations: Added new observation of DIAB EYE EX: No diabetic retinopathy.    (04/21/2010 10:24) Added new observation of [id number]: 04/2011 (04/21/2010 10:24)       Diabetic Eye Exam  Procedure date:  04/21/2010  Findings:      normal:    Diabetic Eye Exam  Procedure date:  04/21/2010  Findings:      No diabetic retinopathy.     Procedures Next Due Date:    Diabetic Eye Exam: 04/2011

## 2011-01-10 NOTE — Assessment & Plan Note (Signed)
Summary: EST-CK/FU/MEDS/CFB   Vital Signs:  Patient profile:   40 year old female Height:      60 inches (152.40 cm) Weight:      196.03 pounds (89.10 kg) BMI:     38.42 O2 Sat:      98 % on Room air Temp:     97.5 degrees F (36.39 degrees C) oral Pulse rate:   89 / minute BP sitting:   131 / 79  (left arm)  Vitals Entered By: Angelina Ok RN (June 30, 2010 11:26 AM)  O2 Flow:  Room air CC: Depression Is Patient Diabetic? Yes Did you bring your meter with you today? No Pain Assessment Patient in pain? no      Nutritional Status BMI of > 30 = obese CBG Result 209  Have you ever been in a relationship where you felt threatened, hurt or afraid?No   Does patient need assistance? Functional Status Self care Ambulation Normal Comments Needs refills on meds-Inhaler.    CC:  Depression.  Depression History:      The patient is having a depressed mood most of the day but denies diminished interest in her usual daily activities.        The patient denies that she feels like life is not worth living, denies that she wishes that she were dead, and denies that she has thought about ending her life.        Comments:  Occassional.   Preventive Screening-Counseling & Management  Alcohol-Tobacco     Smoking Status: never     Smoking Cessation Counseling: yes     Passive Smoke Exposure: no  Current Medications (verified): 1)  Albuterol 90 Mcg/act  Aers (Albuterol) .Marland Kitchen.. 1-2 Puffs Every 4-6 Hours As Needed For Wheezing or Shortness of Breath 2)  Victoza 18 Mg/26ml Soln (Liraglutide) .... Inject 0.6mg  Once A Day For 1 Week At The Same Time Each Day, Then Increase To 1.2mg   The Second Week and Thereafter 3)  Easy Touch Pen Needles 31g X 5 Mm Misc (Insulin Pen Needle) .... Use To Inject Victoza One A Day At The Same Time 4)  Lisinopril 5 Mg Tabs (Lisinopril) .... One By Mouth Daily 5)  Aspir-Low 81 Mg Tbec (Aspirin) .... Take 1 Tablet By Mouth Once A Day 6)  Ergocalciferol 50000  Unit Caps (Ergocalciferol) .... Take 1 Capsule Weekly For 8 Weeks  Allergies (verified): 1)  ! * Vicodan 2)  ! Percocet   Impression & Recommendations:  Problem # 1:  MASTODYNIA (ICD-611.71) Plans to have breast reduction at [facility]. I agree with this plan and believe that it is medically nec. Chronic chest pain, fungal skin infections ongoing.  Problem # 2:  DIABETES MELLITUS, TYPE II (ICD-250.00)  Doing well on Victoza. No changes. Her updated medication list for this problem includes:    Victoza 18 Mg/57ml Soln (Liraglutide) ..... Inject 0.6mg  once a day for 1 week at the same time each day, then increase to 1.2mg   the second week and thereafter    Lisinopril 5 Mg Tabs (Lisinopril) ..... One by mouth daily    Aspir-low 81 Mg Tbec (Aspirin) .Marland Kitchen... Take 1 tablet by mouth once a day  Orders: Capillary Blood Glucose/CBG ([address]) T-Hgb A1C (in-house) ([id number])  Labs Reviewed: Creat: 0.77 (04/21/2010)     Last Eye Exam: No diabetic retinopathy.    (04/21/2010) Reviewed HgBA1c results: 7.0 (04/21/2010)  7.7 (12/30/2009)  Labs Reviewed: Creat: 0.77 (04/21/2010)     Last Eye Exam: No diabetic  retinopathy.    (04/21/2010) Reviewed HgBA1c results: 7.7 (06/30/2010)  7.0 (04/21/2010)  Problem # 3:  HYPERTENSION (ICD-401.9) No change BP at goal. Her updated medication list for this problem includes:    Lisinopril 5 Mg Tabs (Lisinopril) ..... One by mouth daily  BP today: 131/79 Prior BP: 124/91 (05/10/2010)  Labs Reviewed: K+: 4.2 (04/21/2010) Creat: : 0.77 (04/21/2010)   Chol: 167 (04/21/2010)   HDL: 45 (04/21/2010)   LDL: 93 (04/21/2010)   TG: 143 (04/21/2010)  Complete Medication List: 1)  Albuterol 90 Mcg/act Aers (Albuterol) .Marland Kitchen.. 1-2 puffs every 4-6 hours as needed for wheezing or shortness of breath 2)  Victoza 18 Mg/102ml Soln (Liraglutide) .... Inject 0.6mg  once a day for 1 week at the same time each day, then increase to 1.2mg   the second week and thereafter 3)  Easy  Touch Pen Needles 31g X 5 Mm Misc (Insulin pen needle) .... Use to inject victoza one a day at the same time 4)  Lisinopril 5 Mg Tabs (Lisinopril) .... One by mouth daily 5)  Aspir-low 81 Mg Tbec (Aspirin) .... Take 1 tablet by mouth once a day 6)  Ergocalciferol 50000 Unit Caps (Ergocalciferol) .... Take 1 capsule weekly for 8 weeks 7)  Lotrisone 1-0.05 % Crea (Clotrimazole-betamethasone) .... Aply once daily as directed 8)  Hydroxyzine Hcl 25 Mg Tabs (Hydroxyzine hcl) .... Take 1 tablet by mouth two times a day for itching  Patient Instructions: 1)  Please schedule a follow-up appointment in 3 months. Prescriptions: ALBUTEROL 90 MCG/ACT  AERS (ALBUTEROL) 1-2 puffs every 4-6 hours as needed for wheezing or shortness of breath  #1 x 11   Entered and Authorized by:   [name]  DO   Signed by:   [name]  DO on 06/30/2010   Method used:   Electronically to        [id number]* (retail)       [address] [place], [place]  [address]       Ph: [phone]       Fax: [phone]   RxID:   [id number] LOTRISONE 1-0.05 % CREA (CLOTRIMAZOLE-BETAMETHASONE) Aply once daily as directed  #1 tube x 3   Entered and Authorized by:   [name]  DO   Signed by:   [name]  DO on 06/30/2010   Method used:   Electronically to        [id number]* (retail)       [address] [place], [place]  [address]       Ph: [phone]       Fax: [phone]   RxID:   [id number] ALBUTEROL 90 MCG/ACT  AERS (ALBUTEROL) 1-2 puffs every 4-6 hours as needed for wheezing or shortness of breath  #1 x 11   Entered and Authorized by:   [name]  DO   Signed by:   [name]  DO on 06/30/2010   Method used:   Electronically to        [facility] (retail)       [address]       [place], [place]  [address]       Ph: [phone]       Fax: [phone]   RxID:   [id number]  Above Lotrisone Cream Rx called  to [facility] per pt.  Chinita Pester RN  July 05, 2010 10:46 AM   Prevention & Chronic Care Immunizations   Influenza vaccine: already had '09  (09/18/2008)   Influenza vaccine deferral: Refused  (10/05/2009)    Tetanus booster: Not documented   Td booster deferral: Deferred  (05/10/2010)    Pneumococcal vaccine: Not documented   Pneumococcal vaccine deferral: Deferred  (05/10/2010)  Other Screening   Pap smear: Not documented   Pap smear action/deferral: Deferred  (05/10/2010)   Smoking status: never  (06/30/2010)  Diabetes Mellitus   HgbA1C: 7.7  (06/30/2010)    Eye exam: No diabetic retinopathy.     (04/21/2010)   Eye exam due: 04/2011    Foot exam: yes  (04/21/2010)   High risk foot: Not documented   Foot care education: Not documented    Urine microalbumin/creatinine ratio: Not documented  Lipids   Total Cholesterol: 167  (04/21/2010)   LDL: 93  (04/21/2010)   LDL Direct: Not documented   HDL: 45  (04/21/2010)   Triglycerides: 143  (04/21/2010)  Hypertension   Last Blood Pressure: 131 / 79  (06/30/2010)   Serum creatinine: 0.77  (04/21/2010)   Serum potassium 4.2  (04/21/2010)  Self-Management Support :   Personal Goals (by the next clinic visit) :     Personal A1C goal: 7  (10/05/2009)     Personal blood pressure goal: 140/90  (10/05/2009)     Personal LDL goal: 70  (10/05/2009)    Patient will work on the following items until the next clinic visit to reach self-care goals:     Medications and monitoring: take my medicines every day, check my blood sugar, bring all of my medications to every visit, examine my feet every day  (06/30/2010)     Eating: drink diet soda or water instead of juice or soda, eat more vegetables, use fresh or frozen vegetables, eat foods that are low in salt, eat baked foods instead of fried foods, eat fruit for snacks and desserts, limit or avoid alcohol  (06/30/2010)     Activity: take a 30 minute walk every day   (06/30/2010)    Diabetes self-management support: Written self-care plan, Education handout, Pre-printed educational material, Resources for patients handout  (06/30/2010)   Diabetes care plan printed   Diabetes education handout printed    Hypertension self-management support: Written self-care plan, Education handout, Pre-printed educational material, Resources for patients handout  (06/30/2010)   Hypertension self-care plan printed.   Hypertension education handout printed      Resource handout printed.    Vital Signs:  Patient profile:   40 year old female Height:      60 inches (152.40 cm) Weight:      196.03 pounds (89.10 kg) BMI:     38.42 O2 Sat:      98 % Temp:     97.5 degrees F (36.39 degrees C) oral Pulse rate:   89 / minute BP sitting:   131 / 79  (left arm)  Vitals Entered By: Angelina Ok RN (June 30, 2010 11:26 AM)  O2 Flow:  Room air    Laboratory Results   Blood Tests   Date/Time Received: June 30, 2010 12:41 PM  Date/Time Reported: Burke Keels  June 30, 2010 12:41 PM   HGBA1C: 7.7%   (Normal Range: Non-Diabetic - 3-6%   Control Diabetic - 6-8%) CBG Random:: 209mg /dL

## 2011-01-10 NOTE — Assessment & Plan Note (Signed)
Summary: 3WK F/U/OK PER DR GOLDING/VS   Vital Signs:  Patient profile:   40 year old female Height:      60 inches (152.40 cm) Weight:      195.05 pounds (88.66 kg) BMI:     38.23 O2 Sat:      98 % on Room air Temp:     97.7 degrees F (36.50 degrees C) oral Pulse rate:   78 / minute BP sitting:   127 / 81  (right arm)  Vitals Entered By: Angelina Ok RN (January 20, 2010 8:38 AM)  O2 Flow:  Room air CC: Depression Is Patient Diabetic? Yes Did you bring your meter with you today? Yes Pain Assessment Patient in pain? no      Nutritional Status BMI of > 30 = obese CBG Result 126  Have you ever been in a relationship where you felt threatened, hurt or afraid?No   Does patient need assistance? Functional Status Self care Ambulation Normal Comments Check up .   Primary Care Provider:  Loreen Freud, MD  CC:  Depression.  History of Present Illness: Makayla Frazier comes in for routine follow-up on her diabetes and chronic chest pain. She has not had any more syncopal episodes, but a few occasions where she felt like her heart was racing. At her last visit we started her on Victoza and she is doing very well.  Depression History:      The patient denies a depressed mood most of the day and a diminished interest in her usual daily activities.        The patient denies that she feels like life is not worth living, denies that she wishes that she were dead, and denies that she has thought about ending her life.         Preventive Screening-Counseling & Management  Alcohol-Tobacco     Smoking Status: never     Smoking Cessation Counseling: yes     Passive Smoke Exposure: no  Current Medications (verified): 1)  Albuterol 90 Mcg/act  Aers (Albuterol) .Marland Kitchen.. 1-2 Puffs Every 4-6 Hours As Needed For Wheezing or Shortness of Breath 2)  Victoza 18 Mg/24ml Soln (Liraglutide) .... Inject 0.6mg  Once A Day For 1 Week At The Same Time Each Day, Then Increase To 1.2mg   The Second Week and  Thereafter 3)  Easy Touch Pen Needles 31g X 5 Mm Misc (Insulin Pen Needle) .... Use To Inject Victoza One A Day At The Same Time 4)  Lisinopril 5 Mg Tabs (Lisinopril) .... One By Mouth Daily  Allergies (verified): 1)  ! * Vicodan 2)  ! Percocet  Physical Exam  General:  alert, well-developed, and well-nourished.   Lungs:  normal respiratory effort, normal breath sounds, no crackles, and no wheezes.   Heart:  normal rate, regular rhythm, and no murmur.   Abdomen:  soft, non-tender, and normal bowel sounds.   Psych:  Oriented X3.     Impression & Recommendations:  Problem # 1:  MASTODYNIA (ICD-611.71) Chronic msk chest pain is most likely related to her breats size. She has had multiple admissions for CP. CT of her chest has been negative for PE. Referral to [facility] for breast reduction surgery. I strongly feel that this is a medically necessary procedure.  Orders: Surgical Referral (Surgery)  Problem # 2:  HYPERTENSION (ICD-401.9) Much better BP control today. Her updated medication list for this problem includes:    Lisinopril 5 Mg Tabs (Lisinopril) ..... One by mouth daily  BP today:  127/81 Prior BP: 151/92 (12/30/2009)  Labs Reviewed: K+: 4.0 (12/30/2009) Creat: : 0.74 (12/30/2009)     Problem # 3:  DIABETES MELLITUS, TYPE II (ICD-250.00) Assessment: New  Doing very well on the Victoza. She has lost almost 5 lbs. CBGs are better. No meter readings today.  Her updated medication list for this problem includes:    Victoza 18 Mg/47ml Soln (Liraglutide) ..... Inject 0.6mg  once a day for 1 week at the same time each day, then increase to 1.2mg   the second week and thereafter    Lisinopril 5 Mg Tabs (Lisinopril) ..... One by mouth daily  Orders: Capillary Blood Glucose/CBG (65784)  Complete Medication List: 1)  Albuterol 90 Mcg/act Aers (Albuterol) .Marland Kitchen.. 1-2 puffs every 4-6 hours as needed for wheezing or shortness of breath 2)  Victoza 18 Mg/27ml Soln (Liraglutide) ....  Inject 0.6mg  once a day for 1 week at the same time each day, then increase to 1.2mg   the second week and thereafter 3)  Easy Touch Pen Needles 31g X 5 Mm Misc (Insulin pen needle) .... Use to inject victoza one a day at the same time 4)  Lisinopril 5 Mg Tabs (Lisinopril) .... One by mouth daily  Patient Instructions: 1)  WIll initiate a referral to [facility] for surgery. 2)  F/U in 2 months with [name]. 3)  Great job with the [employer]!!!  Prevention & Chronic Care Immunizations   Influenza vaccine: already had '09  (09/18/2008)   Influenza vaccine deferral: Refused  (10/05/2009)    Tetanus booster: Not documented    Pneumococcal vaccine: Not documented  Other Screening   Pap smear: Not documented   Smoking status: never  (01/20/2010)  Diabetes Mellitus   HgbA1C: 7.7  (12/30/2009)    Eye exam: Not documented    Foot exam: yes  (08/03/2008)   High risk foot: Not documented   Foot care education: Not documented    Urine microalbumin/creatinine ratio: Not documented  Lipids   Total Cholesterol: Not documented   LDL: Not documented   LDL Direct: Not documented   HDL: Not documented   Triglycerides: Not documented  Hypertension   Last Blood Pressure: 127 / 81  (01/20/2010)   Serum creatinine: 0.74  (12/30/2009)   Serum potassium 4.0  (12/30/2009)  Self-Management Support :   Personal Goals (by the next clinic visit) :     Personal A1C goal: 7  (10/05/2009)     Personal blood pressure goal: 140/90  (10/05/2009)     Personal LDL goal: 70  (10/05/2009)    Patient will work on the following items until the next clinic visit to reach self-care goals:     Medications and monitoring: take my medicines every day, check my blood sugar, bring all of my medications to every visit, examine my feet every day  (01/20/2010)     Eating: drink diet soda or water instead of juice or soda, eat more vegetables, eat foods that are low in salt, limit or avoid alcohol  (01/20/2010)      Activity: take a 30 minute walk every day  (01/20/2010)    Diabetes self-management support: Copy of home glucose meter record  (01/20/2010)    Hypertension self-management support: Written self-care plan  (10/05/2009)    Appended Document: 3WK F/U/OK PER DR GOLDING/VS Level 4 charge code.

## 2011-01-12 ENCOUNTER — Ambulatory Visit: Payer: Self-pay | Admitting: Dietician

## 2011-01-12 NOTE — Initial Assessments (Signed)
INTERNAL MEDICINE ADMISSION HISTORY AND PHYSICAL  PCP: [name], MD  CC: syncope  HPI: Pt is a 40 year-old woman with PMH multiple episodes of syncope, DM, PE who presents after episode of syncope. Patient has had multiple episodes of syncope in the past and has been evaluated by medicine, neurology Pearlean Brownie, [facility]), and cardiology Johney Frame, [facility] electrophysiologist).  Her first episode occured in the hospital after she was diagnosed with PE in 2009, including [facility] after receiving a flu shot in July 2011 and 1 event in her cardiologist's office in 2010.  She had extensive w/u including, negative CBC, CMET, ECHO, CT head, EEG, implanted event monitor last interrogated on 3/11 which was negative for arrythmia.  Pt has had MRI in April 2010 which showed no structural abnormalities in the brain. Never had any findings on neurological exam.  No low CBGs during the episodes. Patient states that she has been told by neurology that her syncope is not 2/2 seizures and by cardiology that her syncope is not 2/2 her heart.  Previous suggestion for psychiatric consultation has been refused by the patient.  She says she has these episodes 2-3 times a month.  Patient states that typically before her episodes of syncope she feels hot and flushed and then passes out.  However, she did not have any warning before passing out this time.   She was walking out of the kitchen when she passed out - her mother, who was in the kitchen, heard her fall and called her name but the patient did not answer. Her mother immediately went to her and saw the patient unconscious on the floor.  The patient was unconscious for approximately 4-5 minutes and came to when EMS arrived and was able to answer questions relatively well within 5 minutes of regaining consciousness.  She was tremulous per her mother after regaining consciousnesss, but there was no shaking or tensing of her body during unconsciousness.  No  incontinence or tongue biting; pt states that with some of her previous episodes she has had damp underwear but no incontinence of stool or tongue biting.  No palpitations, no dizziness.  She does not think she hit her head. Per the patient her CBG at the time was >400.    She reports some momentary "twinges" substernally yesterday when at rest and cleaning her dresser which only lasted a few seconds and have happened to her in the past.  She takes no medicine for this pain, saying "whenever I feel pain, I go to sleep to sleep it off."  She also reports headache, mild, bifrontal, for the past 3 days.  She reports chronic shortness of breath and uses her albuterol inhaler 2-3 times a day and 2 times at night for this.  No SOB or CP with this episode.  No diarrhea or constipation, though patient reports seeing bright red blood on the toilet paper and in the toilet water once last week (she has hemorroids which bother her frequently). No dysuria, though she endorses increased frequency and increased thirst.  No sick contacts.    ALLERGIES: ! * VICODAN - N/V ! PERCOCET  - N/V LATEX - rash ATARAX - rash  PAST MEDICAL HISTORY: Dysfunctional Uterine Bleeding - IUD placed in 2011 Asthma - diagnosed in 2009 Diabetes mellitus, type II - diagnosed in January 2009; Hgb 8.2% in Nov 2011 when taking vytoza Pulmonary embolism, hx of 1/09 - provoked (OCPs), pt took coumadin for 1 year Hx Major Depression  with suicide attempt in 2009 (overdose of tramadol and coumadin) secondary to depression related to losing job after PE diagnosis Morbid obesity.  Obstructive sleep apnea, noncompliant with CPAP.  h/o Anemia - baseline Hgb 12-13 Unexplained recurrent syncope - see HPI for details s/p breast reduction surgery Nov 2011 2/2 chronic breast pain, fungal infections, irritation hemorrhoids s/p cholecystectomy  MEDICATIONS: ALBUTEROL 90 MCG/ACT  AERS (ALBUTEROL) 1-2 puffs every 4-6 hours as needed for wheezing or  shortness of breath EASY TOUCH PEN NEEDLES 31G X 5 MM MISC (INSULIN PEN NEEDLE) use to inject victoza one a day at the same time LISINOPRIL 5 MG TABS (LISINOPRIL) one by mouth daily ADVAIR DISKUS 100-50 MCG/DOSE AEPB (FLUTICASONE-SALMETEROL) Take one puff twice daily for Asthma Formerly on vytoza for diabetes but because of financial reasons now off all diabetic medications.   SOCIAL HISTORY: Patient lives alone, is on disability. Is engaged, has no children.  Completed the 10th grade, is literate.  She has never smoked, drinks wine very occasionally (last drink was 1 glass of wine at Christmas), and denies drug use.   FAMILY HISTORY Father - bone cancer Mother - DM, HTN, HLP She denies any family history of sudden death or cardiomyopathy. She states she does have aunts who passed out as children, though one continued to do so as an adult; the patient does not know any more about this aunt's medical history. Asthma - father migraines - sister   ROS: Otherwise negative, per HPI.  VITALS: T: 97.5  P: 94  BP:135/75  R:20  O2SAT: 100%  ON:RA  PHYSICAL EXAM: Gen: Patient is obese, in NAD, pleasant. Eyes: PERRL, EOMI, No signs of anemia or jaundince. ENT: MMM, OP clear, No erythema, thrush or exudates. Neck: Supple, no lymphadenopathy Resp: CTA- Bilaterally, No whezes. CVS: RRR, No murmur GI: Abdomen is obese, +BS, soft. mildly tender to palpation in upper quadrants. no rebound or guarding.  Rectal:  1+ external, non-bleeding hemmorhoids; guiac negative  Ext: No pedal edema. GU: No CVA tenderness. Skin: No visible rashes. MS: Moving all 4 extremities. Neuro: A&O X3, CN II - XII are grossly intact. Motor strength is 5/5 in the all 4 extremities, Sensations intact to light touch, 2+ reflexes bilaterally. Psych: Appropriate   LABS:   TCO2                                     25                0-100            mmol/L  Ionized Calcium                          1.04       l      1.12-1.32         mmol/L  Hemoglobin (HGB)                         14.3              12.0-15.0        g/dL  Hematocrit (HCT)                         42.0              36.0-46.0        %  Sodium (NA)                              135               135-145          mEq/L  Potassium (K)                            4.3               3.5-5.1          mEq/L  Chloride                                 100               96-112           mEq/L  Glucose                                  462        h      70-99            mg/dL  BUN                                      7                 6-23             mg/dL  Creatinine                               0.8               0.4-1.2          mg/dL   Acetone, Blood                           NEGATIVE          NEG   Pregnancy, Urine-POC                     NEGATIVE   Color, Urine                             YELLOW            YELLOW  Appearance                               CLEAR             CLEAR  Specific Gravity                         1.035      h      1.005-1.030  pH  6.5               5.0-8.0  Urine Glucose                            >1000      a      NEG              mg/dL  Bilirubin                                NEGATIVE          NEG  Ketones                                  15         a      NEG              mg/dL  Blood                                    NEGATIVE          NEG  Protein                                  NEGATIVE          NEG              mg/dL  Urobilinogen                             0.2               0.0-1.0          mg/dL  Nitrite                                  NEGATIVE          NEG  Leukocytes                               NEGATIVE          NEG   Squamous Epithelial / LPF                FEW        a      RARE  FOBT - negative  EKG:  unchanged, normal sinus rhythm  IMAGING: none  ASSESSMENT AND PLAN: (1) syncope:  Patient has had multiple episodes of syncope.  We suspect cause to be  pseudoseizure  possibly with underlying conversion disorder, absence seizure, atypical migrane, cataplexy.  Pt has been extensively worked-up for this in the past (see HPI for details of prior work-up).  No real difference in this episode from prior. VSS, neurologically intact.  No abnormalities in labs.   - admit to telemetry - 2-view CXR - EKG in AM - TSH, Vit B12, folate, FLP, PT/INR, LFTs, HIV - AM CBC, BMet - patient may need EEG monitoring under observation, also consider psychiatric consultation in the AM - Pt counseled that she should  not drive  () DM: Hgb M5H 8.4% in November while on vytoza.  Pt hyperglycemic to 462 in ED without anion gap.  On insulin drip in ED and CBG to 330.  Pt has been off vytoza because of finances.  Of note, the patient has no glucometer at home. - d/c insulin drip - IVF NS@125cc /hr x6h then 100cc/hr - SSI-sensitive with meal coverage for now; given HgbA1c when patient on vytoza, she will likely need insulin as outpatient to better control her diabetes - continue ACE-I - restart ASA 81mg  daily - HgbA1c - CBG q1h x6h then q6h - AM BMet - on discharge, pt needs Rx for glucometer and strips  () HTN: normotensive - will halve lisinopril dose for now given syncope  () h/o provoked DVT/PE:  Pt off coumadin, no leg swelling, not tachycardic or hypoxic, no EKG changes  () asthma:  satting well on RA, no wheezing on exam, though pt with increased albuterol use - increase advair dose - albuterol prn  () hemorrhoids: non-bleeding on exam, though patient reports discomfort - anusol prn  () h/o depression: stable off medicines  () OSA:  non-compliant with CPAP - CPAP per RT  () VTE PROPH: SCDs     ATTENDING: ___________________________________________

## 2011-01-12 NOTE — Progress Notes (Signed)
Summary: Medications  Phone Note Refill Request Message from:  Patient on November 14, 2010 2:02 PM  Refills Requested: Medication #1:  VICTOZA 18 MG/3ML SOLN inject 0.9mg  once a day as directed for blood sugar control Prior Authorization forms need to be completed so that pt's insurance will pay for the Insulin.  Pt is currently not taking any medications for her Diabetes.  Pt said that her sugars were elevated and wants to know what to do next.  Sugars have been in the upper 200 and 300 range.   Method Requested: Electronic Initial call taken by: Angelina Ok RN,  November 14, 2010 2:02 PM  Follow-up for Phone Call        Used to be on Lantus 15 QAM was D/C'd 2/2 hypoglycemia. Will rx smaller does. PT to call us later this week with CBG's be we made need to titrate up the dose. Sent R/x to [facility]. Follow-up by: Blanch Media MD,  November 14, 2010 2:54 PM  Additional Follow-up for Phone Call Additional follow up Details #1::        Pt was given instructions and will call the Clinics with her sugars.   Pt voiced understanding of the plan. Additional Follow-up by: Angelina Ok RN,  November 14, 2010 2:58 Pm    New/Updated Medications: LANTUS 100 UNIT/ML SOLN (INSULIN GLARGINE) 8 units Subcutaneously before bed. Prescriptions: LANTUS 100 UNIT/ML SOLN (INSULIN GLARGINE) 8 units Subcutaneously before bed.  #10 mL x 0   Entered and Authorized by:   Blanch Media MD   Signed by:   Blanch Media MD on 11/14/2010   Method used:   Electronically to        [facility] (retail)       [address]       [place], [place]  [address]       Ph: [phone]       Fax: [phone]   RxID:   [id number]

## 2011-01-12 NOTE — Discharge Summary (Signed)
Summary: Hospital Discharge Update    Hospital Discharge Update:  Date of Admission: 01/01/2011 Date of Discharge: 01/02/2011  Brief Summary:  This is a 40 year old female with a hx of multiple syncopal events that have been extensivly worked up in the past, as well as HTN and DM who presents with a syncopal episode.  This episode was   Lab or other results pending at discharge:  HGB A1C, RBC folate  Labs needed at follow-up: CBC with differential  Other follow-up issues:  Pt was anemic in hospital consider further work up if needed. Patient also appears to have eucocytosis without apparent cause, which may need further investiagation if persistant.  Medication list changes:  Removed medication of LANTUS 100 UNIT/ML SOLN (INSULIN GLARGINE) 8 units Subcutaneously before bed.  Discharge medications:  ALBUTEROL 90 MCG/ACT  AERS (ALBUTEROL) 1-2 puffs every 4-6 hours as needed for wheezing or shortness of breath VICTOZA 18 MG/3ML SOLN (LIRAGLUTIDE) inject 0.9mg  once a day as directed for blood sugar control EASY TOUCH PEN NEEDLES 31G X 5 MM MISC (INSULIN PEN NEEDLE) use to inject victoza one a day at the same time LISINOPRIL 5 MG TABS (LISINOPRIL) one by mouth daily ASPIR-LOW 81 MG TBEC (ASPIRIN) Take 1 tablet by mouth once a day HYDROXYZINE HCL 25 MG TABS (HYDROXYZINE HCL) Take 1 tablet by mouth two times a day for itching ADVAIR DISKUS 100-50 MCG/DOSE AEPB (FLUTICASONE-SALMETEROL) Take one puff twice daily for Asthma  Other patient instructions:  Please come for an appointment at the outpatient clinic at [facility] on 1/30 at 2:45 pm  with Dr. Scot Dock for a followup visit.  Please see Mitchell Heir Wed 11:15am please call if you are going to cancel. You need to get a glucometer to monitor your glucose.   You can pick up your Victoza at your regular pharmacy but please call first.   You are scheduled to see Dr. Terrace Arabia of [facility] on 2/27 1:30pm.  Please take  your medication as prescribed below.  If you have any problem, Please call the clinic.   In case of an emergency  dial 911 or go to the emergency department.   Note: Hospital Discharge Medications & Other Instructions handout was printed, one copy for patient and a second copy to be placed in hospital chart.

## 2011-01-12 NOTE — Progress Notes (Signed)
Summary: prior authorization-Victoza/gp  Phone Note Outgoing Call   Reason for Call: Confirm/change Appt Summary of Call: Prior Authorization  is required for Victoza 0.9mg .  Forms  need to be completed ; they have been placed in your box. Thanks Initial call taken by: Chinita Pester RN,  October 24, 2010 11:06 AM  Follow-up for Phone Call        Denial for Victoza  Prior Authorizationper [employer].  Additional Follow-up for Phone Call Additional follow up Details #1::        Forms completed and put in inbox up front. Please let me know if meds are approved. thx. Additional Follow-up by: [name]  DO,  December 06, 2010 10:13 PM

## 2011-01-12 NOTE — Assessment & Plan Note (Signed)
Summary: DIABETES TEACHING/PER DR BOWEN   Vital Signs:  Patient profile:   40 year old female Weight:      181.6 pounds BMI:     35.59 Is Patient Diabetic? Yes Did you bring your meter with you today? No CBG Result 269 CBG Device ID compact plus meter Comments after eating cereal, 2% milk and half a banana start time 11:05 am End time: 11:50 am  Allergies: 1)  ! * Vicodan 2)  ! Percocet   Complete Medication List: 1)  Albuterol 90 Mcg/act Aers (Albuterol) .Marland Kitchen.. 1-2 puffs every 4-6 hours as needed for wheezing or shortness of breath 2)  Victoza 18 Mg/38ml Soln (Liraglutide) .... Inject 0.9mg  once a day as directed for blood sugar control 3)  Easy Touch Pen Needles 31g X 5 Mm Misc (Insulin pen needle) .... Use to inject victoza one a day at the same time 4)  Lisinopril 5 Mg Tabs (Lisinopril) .... One by mouth daily 5)  Aspir-low 81 Mg Tbec (Aspirin) .... Take 1 tablet by mouth once a day 6)  Hydroxyzine Hcl 25 Mg Tabs (Hydroxyzine hcl) .... Take 1 tablet by mouth two times a day for itching 7)  Advair Diskus 100-50 Mcg/dose Aepb (Fluticasone-salmeterol) .... Take one puff twice daily for asthma 8)  Lantus 100 Unit/ml Soln (Insulin glargine) .... 8 units subcutaneously before bed.  Other Orders: DSMT(Medicare) Individual, 30 Minutes (G0108)   Orders Added: 1)  DSMT(Medicare) Individual, 30 Minutes [G0108]    Diabetes Self Management Training  PCP: [name]  DO Referring MD: [name] DO Date diagnosed with diabetes: 12/11/2005 Diabetes Type: Type 2 non-insulin Current smoking Status: never  Vital Signs Todays Weight: 181.6lb  in BMI 35.59in-lbs   Assessment Work Hours: Not currently working Sources of Support: significant other  Potential Barriers  Economic/Supplies  Coping with Diabetes Current Major Stresses: her health issues are stressing her out  Diabetes Medications:  Comments: Restarted victoza at 0.9 mg yesterady  3pm. She plans to move the  time up daily until taking ts  ~ same time each morning. Feels fine without nausea as yet. Patient given an accu-check compact plus meter today- she demonstrated self monitoirng skills by checking her own blood sugar. Is interested in self monitoring before and after breakfast every other day to see how what she eats affects her blood sugar.     Monitoring Self monitoring blood glucose up to 1 time a day Name of Meter  compact plus meter  Time of Testing  Before Breakfast  Recent Episodes of: Requiring Help from another person  Hyperglycemia : Yes     Estimated /Usual Carb Intake Breakfast # of Carbs/Grams lucky charms, 2% milk and half a banana Lunch # of Carbs/Grams fish Dinner # of Carbs/Grams chicken,vegetables  Nutrition assessment Weight change: Loss Amount of change: has lost > 10 # in oast few months Diabetes Disease Process  Discussed today  Medications State name-action-dose-duration-side effects-and time to take medication: Needs review/assistance   State appropriate timing of food related to medication: Needs review/assistance    Nutritional Management Identify what foods most often affect blood glucose: No knowledge    Verbalize importance of controlling food portions: Demonstrates competencyState importance of spacing and not omitting meals and snacks: Needs review/assistanceState changes planned for home meals/snacks: Needs review/assistance    Monitoring State purpose and frequency of monitoring BG-ketones-HgbA1C  : Needs review/assistance   Perform glucose monitoring/ketone testing and record results correctly: Demonstrates competencyState target blood glucose and HgbA1C goals:  Needs review/assistance    Lifestyle changes:Goal setting and Problem solving Identify lifestyle behaviors that need to change: Demonstrates competencyIdentify risk factors that interfere with health: Needs review/assistance       Psychosocial Adjustment Identify how stress  affects diabetes & two sources of stress: Needs review/assistanceName two ways of obtaining support from family/friends: Needs review/assistanceDiabetes Management Education Done: 01/04/2011    BEHAVIORAL GOALS INITIAL Monitoring blood glucose levels daily: bring meter to all clinic visits        Diabetes Self Management Support: need to explore at next visit Follow-up:same day as next appointment with Dr. [name]

## 2011-01-12 NOTE — Medication Information (Addendum)
Summary: [employer]/ VICTOZA/  [employer]/ VICTOZA/   Imported By: Margie Billet 12/07/2010 12:01:52  _____________________________________________________________________  External Attachment:    Type:   Image     Comment:   External Document  Appended Document: [employer]/ VICTOZA/ Authorization is good until 12/30/2012 per [employer]  Appended Document: [employer]/ VICTOZA/

## 2011-01-17 NOTE — Discharge Summary (Signed)
Makayla Frazier, Makayla Frazier NO.:  [id number]  MEDICAL RECORD NO.:  [id number]          PATIENT TYPE:  OBV  LOCATION:  3710                         FACILITY:  [facility]  PHYSICIAN:  Sinda Du, MD      DATE OF BIRTH:  [birth year]  DATE OF ADMISSION:  01/01/2011 DATE OF DISCHARGE:  01/02/2011                              DISCHARGE SUMMARY   DISCHARGE DIAGNOSES: 1. Recurrent syncopal events. 2. Type 2 diabetes. 3. Hypertension. 4. History of dysfunctional uterine bleeding. 5. History of asthma diagnosed in 2009. 6. History of pulmonary embolism in 2009 which was provoked secondary     to oral contraceptives. 7. History of major depression with suicide attempt in 2009. 8. Morbid obesity. 9. Obstructive sleep apnea. 10.History of anemia. 11.Status post cholecystectomy.  DISCHARGE MEDICATIONS: 1. Albuterol 90 mcg/ACT AERS 1 to 2 puffs every 4-6 hours as needed     for wheezing. 2. Victoza 18 mg/3 mL solution, inject 0.9 mg once a day as directed     for blood sugar control. 3. Lisinopril 5 mg tabs, take one-half tablet by mouth daily. 4. Aspirin 81 mg take 1 tablet daily. 5. Hydroxyzine 25 mg tabs, take 1 tablet by mouth 2 times a day for     itching. 6. Advair Diskus 100/50 mcg dose, take 1 puff twice daily for asthma.  DISPOSITION AND FOLLOWUP:  The patient is scheduled for a followup appointment at the Outpatient Clinic at [facility] on January 30 at 2:45 p.m. with Dr. Scot Dock.  At that visit, a CBC should be checked as the patient was mildly anemic on admission though this appears to be her baseline, consideration for further workup should be discussed if clinically necessary. It should also be ensured that the patient is taking her insulin regimen as needed and the patient's hemoglobin A1c should be reviewed from her hospitalization.  With regards to the patient's anemia, we also did check a RBC folate which needs to be followed up on an outpatient  basis. The patient is scheduled to see Dr. Jamison Neighbor at 11:15 a.m. on January 25.  The patient was instructed to call the clinic if she plans not to attend this meeting.  At this meeting, the patient needs to receive continued diabetic education as well as determination of whether or not she is eligible for glucose meter to monitor her blood glucose control. The patient is scheduled to follow up with Dr. Terrace Arabia of [facility] on February 27 at 1:30 p.m.  At this visit, it should be established whether or not the patient needs to go on any empiric antiepileptics for possible atonic seizures as a cause of the patient's syncopal events. A two-view chest x-ray was performed on January 01, 2011, which demonstrated no active disease.  CONSULTATIONS:  None.  BRIEF ADMITTING HISTORY AND PHYSICAL:  This is a 40 year old woman with past medical history of multiple episodes of syncope, she also had diabetes, and a history of PE  who presented after an episode of syncope.  The patient has had multiple episodes of syncope in the past. This has been evaluated by Medicine, Neurology and  Cardiology.  Her first episode occurred in the hospital after she was diagnosed with a PE in 2009 including 1 event at the Outpatient Clinic after receiving a flu shot in July 2011 and 1 event in her cardiologist's office in 2010.  The patient has had extensive workup including a negative CBC, CMET, echo, CT of the head and EEG as well as implanted event monitor that was last interrogated in March 2011.  The patient had an MRI in April 2010 which showed no structural abnormalities of the brain and has never had any neurologic findings on exam.  The patient has had no CBGs during these episodes.  She has also been told by neurologist that her syncope is not secondary to seizures and by Cardiology this syncope is not secondary to her heart.  Previous suggestion for psychiatric consultation has  been refused by the patient.  She says that these episodes occurred 2-3 times per month.  She says that typically before episodes of syncope she feels hot flushed and then passes out and however she did not have any warning signs prior to this episode.  The patient was walking out of her kitchen and she passed out.  Her mother who was in the kitchen heard her fall and called her name, the patient did not answer, the mother immediately went to see her and saw the patient unconscious on the floor.  The patient was unconscious for approximately 4-5 minutes and when she came to EMS has arrived and she was able to answer questions relatively well within 5 minutes of regaining consciousness.  The patient was tumultuous per her mother after regaining consciousness, but there was no shaking or tensing of the body during her unconscious state.  There was no incontinence or tongue biting and the patient states that with some of her previous episodes she has had done in underwear but never any incontinence to stool or urine.  The patient denied any palpitations or dizziness and she says she does not thinks that she hit her head.  The patient's CBG at the time of our hospital admission was greater than 400.  Of note, the patient has never been injured during any of her falls.  The patient also notes some momentary twinges substernally yesterday when she was at rest and cleaning her dresser, there were lasted for few seconds and they have happened to her in the past.  The patient takes no medications for the pain.  She says "whenever I feel the pain, I just go off to sleep."  She also reports a headache which is mild and bifrontal for the past 3 days and reports chronic shortness of breath.  She uses her albuterol inhaler 2-3 times a day and 2 times at night for this.  The patient does not endorse shortness of breath or chest pain with the episodes and has had no diarrhea or constipation.  ALLERGIES:   VICODIN, PERCOCET, LASIX, ATARAX.  PAST MEDICAL HISTORY:  Dysfunctional uterine bleeding, asthma, type 2 diabetes, pulmonary embolism, history of depression with suicide attempt in 2009, obstructive sleep apnea, history of anemia, baseline hemoglobin 12-13, unexplained recurrent syncope, status post breast reduction surgery in November 2011, hemorrhoids and status post cholecystectomy.  PHYSICAL EXAMINATION:  VITAL SIGNS:  On admission; temperature 97.5, pulse 94, blood pressure 135/79, respiratory rate 20, O2 sat 100% percent on room air. GENERAL:  The patient is obese and in no acute distress. EYES:  Pupils are equal, round and reactive to light.  No  jaundice. ENT:  Moist mucous membrane.  Oropharynx clear.  No erythema, thrush or exudates. NECK:  Supple.  No lymphadenopathy. RESPIRATORY:  Bilaterally clear to auscultation.  No crackles or wheezes. CARDIOVASCULAR:  Regular rate and rhythm.  No murmurs, gallops, or rubs. ABDOMEN:  Obese, positive bowel sounds, soft and mildly tender to palpation in the upper quadrants.  No rebound or guarding. RECTAL:  1+ external nonbleeding hemorrhoids.  Guaiac negative. EXTREMITIES:  No pedal edema. NEURO:  Alert and oriented x3.  Cranial nerves II-XII grossly intact. Muscle strength normal in all extremities.  LABORATORY DATA:  On admission; bicarb 25, hemoglobin 14.3, hematocrit 42.0.  Sodium 135, potassium 4.3, chloride 100, glucose 462, BUN of 7, creatinine of 0.8.  Acetone in the blood was negative.  Urine pregnancy was negative.  Urinalysis demonstrated few squamous cells, negative leukocytes, negative nitrites. EKG was unchanged in normal sinus rhythm.  HOSPITAL COURSE BY PROBLEM: 1. Syncope:  The patient has had multiple episodes of this in the     past.  She has been seen by Cardiology and Neurology as described     above in the H and P, neither of which feels that these are     secondary to seizures or her heart.  Out of concern  for possible     atonic seizures, the patient did receive a followup appointment     with Dr. Terrace Arabia of [facility] for further     evaluation and consideration of empiric antiepileptic treatment.     The patient was admitted to our telemetry floor and did not     demonstrate any arrhythmia while here.  TSH was within normal     limits.  The patient has had a normal EEG in the past and was not     felt that the patient would qualify for EEG under observation as     she only has these events once every few times per month.  I did     recommend consultation with psychiatry to the patient but she     refused this as she has done in the past.  At this point in time,     our differential diagnosis for this syncope would include both     conversion disorder versus possible seizures, but the etiology     remains quite unclear at this time.  The patient had no more     episodes of syncope or lightheadedness during her hospitalization     and she was discharged without any complaints. 2. Type 2 diabetes:  The patient's last hemoglobin A1c was 8.2 in     November and it had been increasing.  The patient states that she     has not been on any diabetic medications in the last several months     because of difficulties in getting her Victoza from the pharmacy.     I did speak with the patient's PCP, Dr. [name] and we had her  preapproval for Victoza double-checked and the patient does qualify     to receive her Victoza and was informed that she should pick this     up at her pharmacy.  Although the patient was reportedly on Lantus     in the electronic medical record, she had not been taking any     insulin during this time frame.  As such, the patient's Lantus was     discontinued and her Victoza was reinstated.  I would recommend  close followup and she will see Jamison Neighbor this Wednesday and will     follow up in the Outpatient Clinic on the 31st for further     management and  titration of her diabetic medications if needed.     Here the patient was placed on sliding-scale insulin and her blood     sugar which was initially greater than 400 was brought under     reasonable control.  The patient will need a glucometer as an     outpatient, so she can more closely monitor her CBGs. 3. Hypertension:  The patient's blood pressure was under excellent     control here in the hospital on one-half her home dose of     lisinopril that is 2.5 mg daily.  On discharge, her blood pressure     was 123/78.  The patient's lisinopril was decreased in half during     the hospitalization given her syncope and the fact that her blood     pressure appears to be under excellent control on a lower dose.     The patient's blood pressure should continued to be monitored as an     outpatient to ensure that she does not need tapering up of this     medication.  DISCHARGE LABORATORY FINDINGS:  CBC; white blood cell count 12.2, hemoglobin 11.3, hematocrit 35.4, platelet count 319. BMET; sodium was 136, potassium was 3.8, chloride was 106, bicarb 25, glucose of 265, BUN of 7, creatinine of 0.7.  Discharge vital signs.  On the day of discharge, the patient's temperature was 98.2.  Blood pressure 123/78, pulse 83, respiratory rate 18 and satting at 98% on room air.     Sinda Du, MD     BB/MEDQ  D:  01/02/2011  T:  01/03/2011  Job:  [id number]  cc:   [facility]  Electronically Signed by Sinda Du MD on 01/05/2011 10:47:13 AM Electronically Signed by Mariea Stable MD on 01/17/2011 09:13:40 AM

## 2011-01-18 LAB — HM DIABETES EYE EXAM

## 2011-01-18 NOTE — Progress Notes (Signed)
Summary: needs Rx for test strips  Phone Note Outgoing Call   Call placed by: Jamison Neighbor RD,CDE,  January 04, 2011 12:11 PM Summary of Call: needs prescriptions for Accu chek softclick lancets and Accuchek Compact plus test strips sent to [facility] on [address]. cannot add to mediaction list at this time.   Follow-up for Phone Call        approved Follow-up by: [name]  DO,  January 09, 2011 10:42 AM    New/Updated Medications: ACCU-CHEK SOFTCLIX LANCETS  MISC (LANCETS) use to check blood sugar up to 30 times a month, can check before and  2 hours after one meal each day ACCU-CHEK COMPACT TEST DRUM  STRP (GLUCOSE BLOOD) use to check blood sugar up to 30 times a month, can check every other day before and 2 hours after one meal as discussed. Prescriptions: ACCU-CHEK COMPACT TEST DRUM  STRP (GLUCOSE BLOOD) use to check blood sugar up to 30 times a month, can check every other day before and 2 hours after one meal as discussed.  #102 x 4   Entered by:   Jamison Neighbor RD,CDE   Authorized by:   [name]  DO   Signed by:   [name]  DO on 01/09/2011   Method used:   Electronically to        [facility] (retail)       [address]       [place], [place]  [address]       Ph: [phone]       Fax: [phone]   RxID:   [id number] ACCU-CHEK SOFTCLIX LANCETS  MISC (LANCETS) use to check blood sugar up to 30 times a month, can check before and  2 hours after one meal each day  #100 x 3   Entered by:   Jamison Neighbor RD,CDE   Authorized by:   [name]  DO   Signed by:   [name]  DO on 01/09/2011   Method used:   Electronically to        [facility] (retail)       [address]       [place], [place]  [address]       Ph: [phone]       Fax: [phone]   RxID:   [id number]

## 2011-01-18 NOTE — Assessment & Plan Note (Signed)
Summary: acute-hfu-per dr bowen/cfb([place])   Vital Signs:  Patient profile:   40 year old female Height:      60 inches Weight:      182.5 pounds BMI:     35.77 Temp:     97.5 degrees F oral Pulse rate:   88 / minute BP sitting:   128 / 80  (right arm)  Vitals Entered By: Filomena Jungling NT II (January 09, 2011 2:34 PM) CC: HFU, Depression Is Patient Diabetic? Yes Did you bring your meter with you today? Yes Nutritional Status BMI of > 30 = obese  Have you ever been in a relationship where you felt threatened, hurt or afraid?No   Does patient need assistance? Functional Status Self care Ambulation Normal   Primary Care Provider:  [name]  DO  CC:  HFU and Depression.  History of Present Illness: 40 y/o w with h/o HTN ,DM, recent admission of syncope from unknown cause comes in for follow up She has been evalauted by cardiologist and neuroligists in past for syncopal episdes she had before as well and were all negative. she is also scheduled to see a neurologist outpatient to decide on if she needs to be on Px anti-epileptic meds for absence seizure. The other differential for her syncope is pschiatric cause as there has never been any witnessed syncope but she has refused psch evaluation.   No new complaints today  DM- has been taking her victoza as prescribed. Seen by Lupita Leash last week. She check CBG at home and has checked it 3 times, her numbers have been between 250-300.       Depression History:      The patient denies a depressed mood most of the day and a diminished interest in her usual daily activities.         Preventive Screening-Counseling & Management  Alcohol-Tobacco     Alcohol drinks/day: 0     Smoking Status: never     Smoking Cessation Counseling: yes     Passive Smoke Exposure: no  Caffeine-Diet-Exercise     Does Patient Exercise: no     Times/week: 4-5  Current Medications (verified): 1)  Albuterol 90 Mcg/act  Aers (Albuterol) .Marland Kitchen.. 1-2  Puffs Every 4-6 Hours As Needed For Wheezing or Shortness of Breath 2)  Victoza 18 Mg/83ml Soln (Liraglutide) .... Inject 0.9mg  Once A Day As Directed For Blood Sugar Control 3)  Easy Touch Pen Needles 31g X 5 Mm Misc (Insulin Pen Needle) .... Use To Inject Victoza One A Day At The Same Time 4)  Lisinopril 5 Mg Tabs (Lisinopril) .... 1/2 By Mouth Daily 5)  Aspir-Low 81 Mg Tbec (Aspirin) .... Take 1 Tablet By Mouth Once A Day 6)  Hydroxyzine Hcl 25 Mg Tabs (Hydroxyzine Hcl) .... Take 1 Tablet By Mouth Two Times A Day For Itching 7)  Advair Diskus 100-50 Mcg/dose Aepb (Fluticasone-Salmeterol) .... Take One Puff Twice Daily For Asthma 8)  Accu-Chek Softclix Lancets  Misc (Lancets) .... Use To Check Blood Sugar Up To 30 Times A Month, Can Check Before and  2 Hours After One Meal Each Day 9)  Accu-Chek Compact Test Drum  Strp (Glucose Blood) .... Use To Check Blood Sugar Up To 30 Times A Month, Can Check Every Other Day Before and 2 Hours After One Meal As Discussed.  Allergies (verified): 1)  ! * Vicodan 2)  ! Percocet  Review of Systems  The patient denies anorexia, fever, weight loss, weight gain, vision loss,  decreased hearing, hoarseness, chest pain, syncope, dyspnea on exertion, peripheral edema, prolonged cough, headaches, hemoptysis, abdominal pain, melena, hematochezia, severe indigestion/heartburn, hematuria, incontinence, genital sores, muscle weakness, suspicious skin lesions, transient blindness, difficulty walking, depression, unusual weight change, abnormal bleeding, enlarged lymph nodes, angioedema, breast masses, and testicular masses.    Physical Exam  General:  Gen: VS reveiwed, Alert, well developed, nodistress ENT: mucous membranes pink & moist. No abnormal finds in ear and nose. CVC:S1 S2 , no murmurs, no abnormal heart sounds. Lungs: Clear to auscultation B/L. No wheezes, crackles or other abnormal sounds Abdomen: soft, non distended, no tender. Normal Bowel sounds EXT: no  pitting edema, no engorged veins, Pulsations normal  Neuro:alert, oriented *3, cranial nerved 2-12 intact, strenght normal in all  extremities, senstations normal to light touch.      Impression & Recommendations:  Problem # 1:  ANEMIA (ICD-285.9) will check CBC and ferritin  Orders: T-CBC w/Diff ([id number]) T-Ferritin ([id number])  Problem # 2:  SYNCOPE AND COLLAPSE (ICD-780.2) no further episodes after dischaarge, continue to monitor.  going to see neurologist tomorrow for need for possible long term anti-epileptic medications  Problem # 3:  HYPERTENSION (ICD-401.9) Its fairly controlled with 2.5 mg lisinopril, she may need a tighter control but would not increase the dose back up again as hypotension was thought ot be a possible cause of her syncope. Will follow up on BP for sometime and if it continues to stay >130, will increase the dose of lisinopril  Her updated medication list for this problem includes:    Lisinopril 5 Mg Tabs (Lisinopril) .Marland Kitchen... 1/2 by mouth daily  BP today: 128/80 Prior BP: 125/86 (10/13/2010)  Labs Reviewed: K+: 3.4 (10/13/2010) Creat: : 0.81 (10/13/2010)   Chol: 167 (04/21/2010)   HDL: 45 (04/21/2010)   LDL: 93 (04/21/2010)   TG: 143 (04/21/2010)  Problem # 4:  DIABETES MELLITUS, TYPE II (ICD-250.00) She has not checked her CBGs enough number of times yet, so will not change anything today. Will be address at next visit.   Her updated medication list for this problem includes:    Victoza 18 Mg/73ml Soln (Liraglutide) ..... Inject 0.9mg  once a day as directed for blood sugar control    Lisinopril 5 Mg Tabs (Lisinopril) .Marland Kitchen... 1/2 by mouth daily    Aspir-low 81 Mg Tbec (Aspirin) .Marland Kitchen... Take 1 tablet by mouth once a day  Complete Medication List: 1)  Albuterol 90 Mcg/act Aers (Albuterol) .Marland Kitchen.. 1-2 puffs every 4-6 hours as needed for wheezing or shortness of breath 2)  Victoza 18 Mg/54ml Soln (Liraglutide) .... Inject 0.9mg  once a day as directed for  blood sugar control 3)  Easy Touch Pen Needles 31g X 5 Mm Misc (Insulin pen needle) .... Use to inject victoza one a day at the same time 4)  Lisinopril 5 Mg Tabs (Lisinopril) .... 1/2 by mouth daily 5)  Aspir-low 81 Mg Tbec (Aspirin) .... Take 1 tablet by mouth once a day 6)  Hydroxyzine Hcl 25 Mg Tabs (Hydroxyzine hcl) .... Take 1 tablet by mouth two times a day for itching 7)  Advair Diskus 100-50 Mcg/dose Aepb (Fluticasone-salmeterol) .... Take one puff twice daily for asthma 8)  Accu-chek Softclix Lancets Misc (Lancets) .... Use to check blood sugar up to 30 times a month, can check before and  2 hours after one meal each day 9)  Accu-chek Compact Test Drum Strp (Glucose blood) .... Use to check blood sugar up to 30 times a month, can check every other  day before and 2 hours after one meal as discussed.  Patient Instructions: 1)  Please schedule a follow-up appointment in 2-3 months with pcp.   Orders Added: 1)  T-CBC w/Diff [[id number]] 2)  T-Ferritin [[id number]] 3)  Est. Patient Level IV [09811]    Process Orders Check Orders Results:     [facility]: Check successful Tests Sent for requisitioning (January 09, 2011 5:38 PM):     01/09/2011: [facility] -- T-CBC w/Diff [[id number]] (signed)     01/09/2011: [facility] -- T-Ferritin [phone] (signed)

## 2011-01-26 ENCOUNTER — Encounter: Payer: Self-pay | Admitting: Internal Medicine

## 2011-01-26 ENCOUNTER — Ambulatory Visit (INDEPENDENT_AMBULATORY_CARE_PROVIDER_SITE_OTHER): Payer: Medicare Other | Admitting: Internal Medicine

## 2011-01-26 DIAGNOSIS — R55 Syncope and collapse: Secondary | ICD-10-CM

## 2011-01-26 DIAGNOSIS — I10 Essential (primary) hypertension: Secondary | ICD-10-CM

## 2011-01-26 DIAGNOSIS — L299 Pruritus, unspecified: Secondary | ICD-10-CM

## 2011-01-26 DIAGNOSIS — B369 Superficial mycosis, unspecified: Secondary | ICD-10-CM

## 2011-01-26 DIAGNOSIS — E119 Type 2 diabetes mellitus without complications: Secondary | ICD-10-CM

## 2011-01-26 DIAGNOSIS — D649 Anemia, unspecified: Secondary | ICD-10-CM

## 2011-01-26 DIAGNOSIS — E039 Hypothyroidism, unspecified: Secondary | ICD-10-CM

## 2011-01-26 LAB — GLUCOSE, CAPILLARY: Glucose-Capillary: 220 mg/dL — ABNORMAL HIGH (ref 70–99)

## 2011-01-26 LAB — POCT GLYCOSYLATED HEMOGLOBIN (HGB A1C): Hemoglobin A1C: 10.1

## 2011-01-26 MED ORDER — METFORMIN HCL ER 500 MG PO TB24: 500.0000 mg | ORAL_TABLET | Freq: Every day | ORAL | Status: DC

## 2011-01-26 NOTE — Assessment & Plan Note (Addendum)
Started on ACE last visit. Needs BMET.

## 2011-01-26 NOTE — Assessment & Plan Note (Signed)
Okay to use hydroxyzine when necessary

## 2011-01-26 NOTE — Assessment & Plan Note (Signed)
Will check a TSH today.

## 2011-01-26 NOTE — Patient Instructions (Signed)
May try Floradix as an Iron Supplement if you cannot tolerate other forms of Iron. Restart Metformin daily. Need colonoscopy.

## 2011-01-26 NOTE — Assessment & Plan Note (Addendum)
Continue Victoza. Start on Metformin XR daily. Titrate metformin as tolerated.  Encouraged her to check her blood sugars  2-3 times per day.  She is on an ACE inhibitor, aspirin.  We'll review the prior history to see she has had a yearly eye exam. She has no feet problems.

## 2011-01-26 NOTE — Assessment & Plan Note (Signed)
This is an ongoing problem for Makayla Frazier.  At present I think it is possible there could be more of a neurological workup to be done  because some parts of the history to sit with possible partial or absence seizures.  She May Be a Good Candidate for the [facility] Epilepsy Monitoring Unit However I Am Not Sure at Present if She Meets Criteria.  Fortunately she has not injured herself during any of these episodes.  From a metabolic standpoint there are no underlying issues- these episodes are likely not caused by hypoglycemia or other Metabolic problem.  Makayla Frazier does indeed have issues related to her mental health, but it is quite possible that there may be a medical foundation for these episodes of syncope in addition to a complex psychiatric problem.  I support continuing a full and aggressive workup.

## 2011-01-26 NOTE — Assessment & Plan Note (Signed)
Stable no change

## 2011-01-26 NOTE — Progress Notes (Signed)
  Subjective:    Patient ID: Makayla Frazier, female    DOB: 07-Sep-1971, 40 y.o.   MRN: [id number]  HPI  Makayla Frazier comes in today for routine care. She has gone through a period of time where she has not had access to her insulin her diabetes medications. Her A1c is 10.3 today. She had a recent hospitalization for another episode of syncope. At this point the etiology of the syncopal episode is unknown.  She has had a comprehensive cardiac evaluation and has seen neurology. During these episodes she has not sustained any serious injuries. MRI of her brain was relatively normal with no evidence of foci for seizures. She had a prior EEG with no significant abnormalities. These episodes do not appear to be related to hypoglycemia. He recalls having the syncopal episodes prior to the onset of her pulmonary embolism in 2009. Cardiac loop recorder has not revealed a significant arrhythmia contributing to a syncopal collapse.  She needs medication refills today she also needs routine lab work.  She expresses concern today that her mother has a new diagnosis of ulcerative colitis. She reports a history of having bloody stools  Intermittently.  She has a baseline anemia which was initially felt to be for menorrhagia however this has improved since seen Dr.  Pennie Rushing and having an IUD placed.  He does not report any decreased mood or feelings of depression.    Review of Systems     Objective:   Physical Exam  Nursing note and vitals reviewed. Constitutional: She is oriented to person, place, and time. She appears well-developed and well-nourished. No distress.  HENT:  Head: Normocephalic and atraumatic.  Cardiovascular: Normal rate, regular rhythm and normal heart sounds.   Pulmonary/Chest: Effort normal and breath sounds normal.  Abdominal: Soft. Bowel sounds are normal. She exhibits no distension. There is no tenderness.  Musculoskeletal: She exhibits no edema and no tenderness.  Neurological: She is  alert and oriented to person, place, and time.  Skin: Skin is warm and dry. No rash noted. No erythema.  Psychiatric: She has a normal mood and affect. Her behavior is normal.          Assessment & Plan:

## 2011-01-26 NOTE — Assessment & Plan Note (Signed)
The patient continues to have a baseline anemia. I will check a CBC today. She has a family history of inflammatory bowel disease in her mother was recently diagnosed with ulcerative colitis. I would like to send her to GI for evaluation for colonoscopy she is almost 40 years old and may benefit from early screening and also diagnostic information since she has a baseline anemia. In the hospital she was FOBT positive this was felt to be from significant external hemorrhoids.  Referral placed for GI.

## 2011-01-26 NOTE — Assessment & Plan Note (Signed)
Continue with topical tx. As needed.

## 2011-01-27 LAB — CBC WITH DIFFERENTIAL/PLATELET
Basophils Relative: 0 % (ref 0–1)
Eosinophils Absolute: 0.2 10*3/uL (ref 0.0–0.7)
Eosinophils Relative: 2 % (ref 0–5)
Lymphs Abs: 4 10*3/uL (ref 0.7–4.0)
MCH: 24.4 pg — ABNORMAL LOW (ref 26.0–34.0)
MCHC: 31.4 g/dL (ref 30.0–36.0)
MCV: 77.6 fL — ABNORMAL LOW (ref 78.0–100.0)
Platelets: 349 10*3/uL (ref 150–400)
RBC: 5.41 MIL/uL — ABNORMAL HIGH (ref 3.87–5.11)

## 2011-01-27 LAB — BASIC METABOLIC PANEL
CO2: 25 mEq/L (ref 19–32)
Calcium: 9.3 mg/dL (ref 8.4–10.5)
Creat: 0.64 mg/dL (ref 0.40–1.20)
Glucose, Bld: 207 mg/dL — ABNORMAL HIGH (ref 70–99)

## 2011-01-30 ENCOUNTER — Encounter: Payer: Self-pay | Admitting: Internal Medicine

## 2011-02-02 ENCOUNTER — Encounter: Payer: Self-pay | Admitting: Internal Medicine

## 2011-02-02 ENCOUNTER — Encounter (INDEPENDENT_AMBULATORY_CARE_PROVIDER_SITE_OTHER): Payer: Medicare Other | Admitting: Internal Medicine

## 2011-02-02 DIAGNOSIS — R55 Syncope and collapse: Secondary | ICD-10-CM

## 2011-02-07 NOTE — Assessment & Plan Note (Signed)
Summary: past due follow up/mt/pacer check/pmo   Visit Type:  ILR-[employer] Referring Provider:  pcp Primary Provider:  [name]  DO  CC:  syncope.  History of Present Illness: Makayla Frazier returns today for follow-up.  She reports doing well recently.  She was evaluated in January for syncope and found to have significantly elevated blood sugar at that time.  She was referred back to Neurology but has not been compliant with this.  She denies any further syncope since that time.  She is s/p breast augmentation since last being seen in our office.  Her chest wall pain has much improved since her surgery.  She denies exertional chest pain, shortness of breath, orthopnea, PND, lower extremity edema, dizziness, or seizures. The patient is tolerating medications without difficulties and is otherwise without complaint today.   Back Pain History:      The pain is located in the lower back region and does not radiate below the knees.     Current Medications (verified): 1)  Albuterol 90 Mcg/act  Aers (Albuterol) .Marland Kitchen.. 1-2 Puffs Every 4-6 Hours As Needed For Wheezing or Shortness of Breath 2)  Victoza 18 Mg/27ml Soln (Liraglutide) .... Inject 0.9mg  Once A Day As Directed For Blood Sugar Control 3)  Easy Touch Pen Needles 31g X 5 Mm Misc (Insulin Pen Needle) .... Use To Inject Victoza One A Day At The Same Time 4)  Lisinopril 5 Mg Tabs (Lisinopril) .... 1/2 By Mouth Daily 5)  Aspir-Low 81 Mg Tbec (Aspirin) .... Take 1 Tablet By Mouth Once A Day 6)  Hydroxyzine Hcl 25 Mg Tabs (Hydroxyzine Hcl) .... Take 1 Tablet By Mouth Two Times A Day For Itching 7)  Advair Diskus 100-50 Mcg/dose Aepb (Fluticasone-Salmeterol) .... Take One Puff Twice Daily For Asthma 8)  Accu-Chek Softclix Lancets  Misc (Lancets) .... Use To Check Blood Sugar Up To 30 Times A Month, Can Check Before and  2 Hours After One Meal Each Day 9)  Accu-Chek Compact Test Drum  Strp (Glucose Blood) .... Use To Check Blood Sugar Up To 30 Times A  Month, Can Check Every Other Day Before and 2 Hours After One Meal As Discussed. 10)  Ferrous Sulfate 325 (65 Fe) Mg Tabs (Ferrous Sulfate) .... Week -1: 1 Tablet  Mouth Daily  Week-2: Take 1 Tablet By Mouth Two Times A Day  Week 3 Onwards- Take 1 Tablet By Mouth Three Times A Day  Allergies (verified): 1)  ! * Vicodan 2)  ! Percocet  Past History:  Past Medical History: Dysfunctional Uterine Bleeding Asthma Diabetes mellitus, type II Pulmonary embolism, hx of 1/09 Hx Major Depression with suicide attempt in 2009(overdose) Morbid obesity.  Obstructive sleep apnea, noncompliant with CPAP.  Anemia.  HTN Unexplained recurrent syncope  Past Surgical History: Reviewed history from 10/20/2009 and no changes required. Status post cholecystectomy.  ILR placement 10/19/09  Social History: Reviewed history from 08/31/2009 and no changes required. Single Occupation: unemployed No tobacco  No ETOH No Illicit Drugs Patient never smoked.   Review of Systems       All systems are reviewed and negative except as listed in the HPI.   Vital Signs:  Patient profile:   40 year old female Height:      60 inches Weight:      181 pounds BMI:     35.48 Pulse rate:   84 / minute BP sitting:   142 / 90  (left arm) Cuff size:   regular  Vitals Entered By:  Caralee Ates CMA (February 02, 2011 11:08 AM)  Physical Exam  General:  morbidly obese, NAD Head:  normocephalic and atraumatic Eyes:  PERRLA/EOM intact; conjunctiva and lids normal. Mouth:  Teeth, gums and palate normal. Oral mucosa normal. Neck:  supple Chest Wall:  ILR site well healed Lungs:  Clear bilaterally to auscultation and percussion. Heart:  Non-displaced PMI, chest non-tender; regular rate and rhythm, S1, S2 without murmurs, rubs or gallops. Carotid upstroke normal, no bruit. Normal abdominal aortic size, no bruits. Femorals normal pulses, no bruits. Pedals normal pulses. No edema, no varicosities. Abdomen:  Bowel sounds  positive; abdomen soft and non-tender without masses, organomegaly, or hernias noted. No hepatosplenomegaly. Msk:  Back normal, normal gait. Muscle strength and tone normal. Extremities:  No clubbing or cyanosis. Neurologic:  Alert and oriented x 3. Skin:  Intact without lesions or rashes.   EKG  Procedure date:  02/02/2011  Findings:      sinus rhythm 73 bpm, PR 142, QRS 80, Qtc 436, otherwise normal ekg   ILR Following MD Hillis Range, MD DOI:  10/19/2009 Vendor:  [employer]     Model Number:  [id number]     Serial Number [id number]       Tachy Episodes:  0     Brady Episodes:  10 ILR Next Due 04/11/2011  Tech Comments:  Pt had syncopal episode x 1 month.  No patient activated episodes. 10 brady episodes--undersensing.  ROV in 3 mths w/device clinic. Vella Kohler  February 02, 2011 11:18 AM  MD Comments:  no arrhythmias to correlate or explain syncope  Impression & Recommendations:  Problem # 1:  SYNCOPE AND COLLAPSE (ICD-780.2) she continues to have recurrent unexplained syncope.  I am most suspicious of a neurologic source such as seizures.  She has not been complaint with neurology follow-up.  Her loop monitor has revealed no arrhythmias to explain her syncope.  I therefore think that a cardiovascular source is unlikely. No changes today Follow-up with neurology and PCP no driving indefinitely  Problem # 2:  HYPERTENSION (ICD-401.9) stable given syncope, I would not aggressively control BP at this time.  Problem # 3:  CHEST WALL PAIN, ANTERIOR (ICD-786.52) improved  Other Orders: EKG w/ Interpretation (93000)  Patient Instructions: 1)  Your physician recommends that you schedule a follow-up appointment in: 3 months with the device clinic 2)  Your physician recommends that you continue on your current medications as directed. Please refer to the Current Medication list given to you today.

## 2011-02-21 NOTE — Cardiovascular Report (Signed)
Summary: Office Visit  Office Visit   Imported By: Marylou Mccoy 02/16/2011 16:09:38  _____________________________________________________________________  External Attachment:    Type:   Image     Comment:   External Document

## 2011-02-23 LAB — CBC
HCT: 32.2 % — ABNORMAL LOW (ref 36.0–46.0)
MCH: 26 pg (ref 26.0–34.0)
MCV: 80.6 fL (ref 78.0–100.0)
MCV: 77.6 fL — ABNORMAL LOW (ref 78.0–100.0)
Platelets: 295 10*3/uL (ref 150–400)
RBC: 4 MIL/uL (ref 3.87–5.11)
RBC: 4.92 MIL/uL (ref 3.87–5.11)
RDW: 14.4 % (ref 11.5–15.5)
WBC: 13.8 10*3/uL — ABNORMAL HIGH (ref 4.0–10.5)

## 2011-02-23 LAB — BASIC METABOLIC PANEL
BUN: 4 mg/dL — ABNORMAL LOW (ref 6–23)
BUN: 6 mg/dL (ref 6–23)
CO2: 26 mEq/L (ref 19–32)
Chloride: 103 mEq/L (ref 96–112)
Chloride: 103 mEq/L (ref 96–112)
Creatinine, Ser: 0.79 mg/dL (ref 0.4–1.2)
Glucose, Bld: 200 mg/dL — ABNORMAL HIGH (ref 70–99)
Glucose, Bld: 292 mg/dL — ABNORMAL HIGH (ref 70–99)
Potassium: 4.1 mEq/L (ref 3.5–5.1)

## 2011-02-23 LAB — DIFFERENTIAL
Eosinophils Absolute: 0.1 10*3/uL (ref 0.0–0.7)
Eosinophils Relative: 1 % (ref 0–5)
Lymphs Abs: 4.5 10*3/uL — ABNORMAL HIGH (ref 0.7–4.0)

## 2011-02-23 LAB — GLUCOSE, CAPILLARY: Glucose-Capillary: 243 mg/dL — ABNORMAL HIGH (ref 70–99)

## 2011-02-23 LAB — PROTIME-INR: Prothrombin Time: 12.7 seconds (ref 11.6–15.2)

## 2011-02-23 LAB — POCT CARDIAC MARKERS: Myoglobin, poc: 35.8 ng/mL (ref 12–200)

## 2011-02-23 LAB — PREGNANCY, URINE: Preg Test, Ur: NEGATIVE

## 2011-02-25 LAB — GLUCOSE, CAPILLARY: Glucose-Capillary: 209 mg/dL — ABNORMAL HIGH (ref 70–99)

## 2011-02-26 LAB — GLUCOSE, CAPILLARY: Glucose-Capillary: 164 mg/dL — ABNORMAL HIGH (ref 70–99)

## 2011-02-27 LAB — POCT CARDIAC MARKERS
CKMB, poc: <1 ng/mL — ABNORMAL LOW (ref 1.0–8.0)
CKMB, poc: <1 ng/mL — ABNORMAL LOW (ref 1.0–8.0)
Myoglobin, poc: 40.4 ng/mL (ref 12–200)
Troponin i, poc: <0.05 ng/mL (ref 0.00–0.09)
Troponin i, poc: <0.05 ng/mL (ref 0.00–0.09)

## 2011-02-27 LAB — URINE MICROSCOPIC-ADD ON

## 2011-02-27 LAB — URINE CULTURE: Culture: NO GROWTH

## 2011-02-27 LAB — URINALYSIS, ROUTINE W REFLEX MICROSCOPIC
Glucose, UA: 100 mg/dL — AB
Hgb urine dipstick: NEGATIVE
Protein, ur: NEGATIVE mg/dL
Specific Gravity, Urine: 1.021 (ref 1.005–1.030)
pH: 6 (ref 5.0–8.0)

## 2011-02-27 LAB — BASIC METABOLIC PANEL
Calcium: 8.9 mg/dL (ref 8.4–10.5)
GFR calc Af Amer: >60 mL/min (ref >60)
GFR calc non Af Amer: >60 mL/min (ref >60)
Glucose, Bld: 200 mg/dL — ABNORMAL HIGH (ref 70–99)
Potassium: 3.6 mEq/L (ref 3.5–5.1)
Sodium: 139 mEq/L (ref 135–145)

## 2011-02-27 LAB — CBC
HCT: 38.1 % (ref 36.0–46.0)
Hemoglobin: 12.7 g/dL (ref 12.0–15.0)
RBC: 4.68 MIL/uL (ref 3.87–5.11)
RDW: 15.6 % — ABNORMAL HIGH (ref 11.5–15.5)
WBC: 9.3 10*3/uL (ref 4.0–10.5)

## 2011-02-27 LAB — PREGNANCY, URINE: Preg Test, Ur: NEGATIVE

## 2011-02-27 LAB — DIFFERENTIAL
Basophils Absolute: 0 10*3/uL (ref 0.0–0.1)
Eosinophils Relative: 2 % (ref 0–5)
Lymphocytes Relative: 30 % (ref 12–46)
Lymphs Abs: 2.8 10*3/uL (ref 0.7–4.0)
Monocytes Absolute: 0.5 10*3/uL (ref 0.1–1.0)
Neutro Abs: 5.8 10*3/uL (ref 1.7–7.7)

## 2011-03-01 LAB — POCT I-STAT, CHEM 8
Creatinine, Ser: 0.9 mg/dL (ref 0.4–1.2)
HCT: 48 % — ABNORMAL HIGH (ref 36.0–46.0)
Hemoglobin: 16.3 g/dL — ABNORMAL HIGH (ref 12.0–15.0)
Sodium: 135 mEq/L (ref 135–145)
TCO2: 32 mmol/L (ref 0–100)

## 2011-03-14 LAB — BASIC METABOLIC PANEL
BUN: 7 mg/dL (ref 6–23)
Chloride: 105 mEq/L (ref 96–112)
Creatinine, Ser: 0.78 mg/dL (ref 0.4–1.2)
GFR calc non Af Amer: >60 mL/min (ref >60)

## 2011-03-14 LAB — CBC
MCHC: 32.6 g/dL (ref 30.0–36.0)
MCV: 80.8 fL (ref 78.0–100.0)
MCV: 81.2 fL (ref 78.0–100.0)
Platelets: 304 10*3/uL (ref 150–400)
Platelets: 371 10*3/uL (ref 150–400)
WBC: 10.6 10*3/uL — ABNORMAL HIGH (ref 4.0–10.5)
WBC: 11.3 10*3/uL — ABNORMAL HIGH (ref 4.0–10.5)

## 2011-03-14 LAB — DIFFERENTIAL
Basophils Relative: 1 % (ref 0–1)
Eosinophils Absolute: 0.2 10*3/uL (ref 0.0–0.7)
Lymphs Abs: 3.3 10*3/uL (ref 0.7–4.0)
Neutro Abs: 6.6 10*3/uL (ref 1.7–7.7)
Neutrophils Relative %: 62 % (ref 43–77)

## 2011-03-14 LAB — POCT I-STAT, CHEM 8
Calcium, Ion: 1.22 mmol/L (ref 1.12–1.32)
Glucose, Bld: 243 mg/dL — ABNORMAL HIGH (ref 70–99)
HCT: 46 % (ref 36.0–46.0)
Hemoglobin: 15.6 g/dL — ABNORMAL HIGH (ref 12.0–15.0)
TCO2: 29 mmol/L (ref 0–100)

## 2011-03-14 LAB — GLUCOSE, CAPILLARY
Glucose-Capillary: 158 mg/dL — ABNORMAL HIGH (ref 70–99)
Glucose-Capillary: 163 mg/dL — ABNORMAL HIGH (ref 70–99)
Glucose-Capillary: 167 mg/dL — ABNORMAL HIGH (ref 70–99)
Glucose-Capillary: 189 mg/dL — ABNORMAL HIGH (ref 70–99)

## 2011-03-14 LAB — CARDIAC PANEL(CRET KIN+CKTOT+MB+TROPI)
CK, MB: 0.9 ng/mL (ref 0.3–4.0)
Relative Index: INVALID (ref 0.0–2.5)
Total CK: 77 U/L (ref 7–177)
Troponin I: 0.03 ng/mL (ref 0.00–0.06)

## 2011-03-14 LAB — HEMOGLOBIN A1C
Hgb A1c MFr Bld: 8.2 % — ABNORMAL HIGH (ref 4.6–6.1)
Mean Plasma Glucose: 189 mg/dL

## 2011-03-14 LAB — LIPID PANEL
Cholesterol: 148 mg/dL (ref 0–200)
HDL: 43 mg/dL (ref >39)

## 2011-03-15 ENCOUNTER — Inpatient Hospital Stay (INDEPENDENT_AMBULATORY_CARE_PROVIDER_SITE_OTHER)
Admission: RE | Admit: 2011-03-15 | Discharge: 2011-03-15 | Disposition: A | Discharge status: Home / Self Care | Payer: Medicare Other | Source: Ambulatory Visit | Attending: Family Medicine | Admitting: Family Medicine

## 2011-03-15 DIAGNOSIS — R5381 Other malaise: Secondary | ICD-10-CM

## 2011-03-15 DIAGNOSIS — R5383 Other fatigue: Secondary | ICD-10-CM

## 2011-03-15 LAB — POCT URINALYSIS DIP (DEVICE)
Nitrite: NEGATIVE
Protein, ur: 30 mg/dL — AB
Specific Gravity, Urine: >=1.03 (ref 1.005–1.030)
Urobilinogen, UA: 0.2 mg/dL (ref 0.0–1.0)

## 2011-03-15 LAB — POCT I-STAT, CHEM 8
BUN: <3 mg/dL — ABNORMAL LOW (ref 6–23)
Calcium, Ion: 1.24 mmol/L (ref 1.12–1.32)
Creatinine, Ser: 0.8 mg/dL (ref 0.4–1.2)
Glucose, Bld: 142 mg/dL — ABNORMAL HIGH (ref 70–99)
TCO2: 30 mmol/L (ref 0–100)

## 2011-03-17 LAB — CBC
HCT: 39.9 % (ref 36.0–46.0)
Hemoglobin: 13 g/dL (ref 12.0–15.0)
MCHC: 32.2 g/dL (ref 30.0–36.0)
MCHC: 32.5 g/dL (ref 30.0–36.0)
MCV: 81.8 fL (ref 78.0–100.0)
MCV: 81.9 fL (ref 78.0–100.0)
RBC: 4.85 MIL/uL (ref 3.87–5.11)
RBC: 4.88 MIL/uL (ref 3.87–5.11)
RDW: 15.4 % (ref 11.5–15.5)

## 2011-03-17 LAB — POCT CARDIAC MARKERS
CKMB, poc: 1.4 ng/mL (ref 1.0–8.0)
CKMB, poc: <1 ng/mL — ABNORMAL LOW (ref 1.0–8.0)
Myoglobin, poc: 55.6 ng/mL (ref 12–200)

## 2011-03-17 LAB — DIFFERENTIAL
Basophils Relative: 1 % (ref 0–1)
Basophils Relative: 1 % (ref 0–1)
Eosinophils Absolute: 0.1 10*3/uL (ref 0.0–0.7)
Eosinophils Absolute: 0.1 10*3/uL (ref 0.0–0.7)
Monocytes Absolute: 0.5 10*3/uL (ref 0.1–1.0)
Monocytes Relative: 7 % (ref 3–12)
Monocytes Relative: 6 % (ref 3–12)
Neutrophils Relative %: 47 % (ref 43–77)

## 2011-03-17 LAB — POCT I-STAT, CHEM 8
Creatinine, Ser: 0.7 mg/dL (ref 0.4–1.2)
Glucose, Bld: 163 mg/dL — ABNORMAL HIGH (ref 70–99)
Hemoglobin: 15 g/dL (ref 12.0–15.0)
Potassium: 3.6 mEq/L (ref 3.5–5.1)
TCO2: 25 mmol/L (ref 0–100)

## 2011-03-17 LAB — BASIC METABOLIC PANEL
BUN: 6 mg/dL (ref 6–23)
CO2: 29 mEq/L (ref 19–32)
Chloride: 102 mEq/L (ref 96–112)
Creatinine, Ser: 0.78 mg/dL (ref 0.4–1.2)
GFR calc Af Amer: >60 mL/min (ref >60)
Glucose, Bld: 154 mg/dL — ABNORMAL HIGH (ref 70–99)

## 2011-03-17 LAB — URINALYSIS, ROUTINE W REFLEX MICROSCOPIC
Bilirubin Urine: NEGATIVE
Bilirubin Urine: NEGATIVE
Glucose, UA: NEGATIVE mg/dL
Hgb urine dipstick: NEGATIVE
Ketones, ur: NEGATIVE mg/dL
Protein, ur: NEGATIVE mg/dL
Urobilinogen, UA: 0.2 mg/dL (ref 0.0–1.0)
pH: 5.5 (ref 5.0–8.0)

## 2011-03-17 LAB — POCT PREGNANCY, URINE: Preg Test, Ur: NEGATIVE

## 2011-03-18 LAB — DIFFERENTIAL
Basophils Absolute: 0 10*3/uL (ref 0.0–0.1)
Lymphocytes Relative: 37 % (ref 12–46)
Monocytes Absolute: 0.5 10*3/uL (ref 0.1–1.0)
Neutro Abs: 4.6 10*3/uL (ref 1.7–7.7)
Neutrophils Relative %: 55 % (ref 43–77)

## 2011-03-18 LAB — BASIC METABOLIC PANEL
Calcium: 8.8 mg/dL (ref 8.4–10.5)
Creatinine, Ser: 0.69 mg/dL (ref 0.4–1.2)
GFR calc non Af Amer: >60 mL/min (ref >60)
Glucose, Bld: 119 mg/dL — ABNORMAL HIGH (ref 70–99)
Sodium: 138 mEq/L (ref 135–145)

## 2011-03-18 LAB — CBC
Hemoglobin: 12.5 g/dL (ref 12.0–15.0)
Platelets: 288 10*3/uL (ref 150–400)
RDW: 15.7 % — ABNORMAL HIGH (ref 11.5–15.5)

## 2011-03-20 LAB — POCT PREGNANCY, URINE: Preg Test, Ur: NEGATIVE

## 2011-03-21 LAB — CBC
HCT: 38.1 % (ref 36.0–46.0)
Hemoglobin: 12.4 g/dL (ref 12.0–15.0)
MCHC: 32.5 g/dL (ref 30.0–36.0)
MCV: 79.8 fL (ref 78.0–100.0)
Platelets: 268 K/uL (ref 150–400)
RBC: 4.77 MIL/uL (ref 3.87–5.11)
RDW: 16.3 % — ABNORMAL HIGH (ref 11.5–15.5)
WBC: 10.2 K/uL (ref 4.0–10.5)

## 2011-03-21 LAB — DIFFERENTIAL
Basophils Absolute: 0 K/uL (ref 0.0–0.1)
Basophils Relative: 0 % (ref 0–1)
Eosinophils Absolute: 0.1 K/uL (ref 0.0–0.7)
Eosinophils Relative: 1 % (ref 0–5)
Lymphocytes Relative: 25 % (ref 12–46)
Lymphs Abs: 2.5 K/uL (ref 0.7–4.0)
Monocytes Absolute: 0.4 K/uL (ref 0.1–1.0)
Monocytes Relative: 4 % (ref 3–12)
Neutro Abs: 7.3 K/uL (ref 1.7–7.7)
Neutrophils Relative %: 71 % (ref 43–77)

## 2011-03-21 LAB — POCT I-STAT, CHEM 8
Chloride: 102 mEq/L (ref 96–112)
Creatinine, Ser: 0.6 mg/dL (ref 0.4–1.2)
Glucose, Bld: 196 mg/dL — ABNORMAL HIGH (ref 70–99)
Potassium: 3.6 mEq/L (ref 3.5–5.1)
Sodium: 137 mEq/L (ref 135–145)

## 2011-03-21 LAB — GLUCOSE, CAPILLARY: Glucose-Capillary: 102 mg/dL — ABNORMAL HIGH (ref 70–99)

## 2011-03-21 LAB — POCT CARDIAC MARKERS

## 2011-03-23 LAB — POCT I-STAT, CHEM 8
BUN: 5 mg/dL — ABNORMAL LOW (ref 6–23)
Calcium, Ion: 1.18 mmol/L (ref 1.12–1.32)
Calcium, Ion: 1.17 mmol/L (ref 1.12–1.32)
Chloride: 105 mEq/L (ref 96–112)
Chloride: 104 mEq/L (ref 96–112)
Glucose, Bld: 70 mg/dL (ref 70–99)
Glucose, Bld: 100 mg/dL — ABNORMAL HIGH (ref 70–99)
Glucose, Bld: 114 mg/dL — ABNORMAL HIGH (ref 70–99)
HCT: 42 % (ref 36.0–46.0)
HCT: 42 % (ref 36.0–46.0)
Hemoglobin: 14.3 g/dL (ref 12.0–15.0)
Hemoglobin: 14.3 g/dL (ref 12.0–15.0)
Potassium: 3.5 mEq/L (ref 3.5–5.1)
Sodium: 139 mEq/L (ref 135–145)
TCO2: 27 mmol/L (ref 0–100)
TCO2: 25 mmol/L (ref 0–100)
TCO2: 27 mmol/L (ref 0–100)

## 2011-03-23 LAB — DIFFERENTIAL
Basophils Absolute: 0 10*3/uL (ref 0.0–0.1)
Basophils Relative: 0 % (ref 0–1)
Eosinophils Absolute: 0.1 10*3/uL (ref 0.0–0.7)
Eosinophils Relative: 1 % (ref 0–5)
Neutrophils Relative %: 59 % (ref 43–77)

## 2011-03-23 LAB — CBC
HCT: 38.7 % (ref 36.0–46.0)
MCHC: 33.1 g/dL (ref 30.0–36.0)
MCV: 79.3 fL (ref 78.0–100.0)
Platelets: 311 10*3/uL (ref 150–400)
RDW: 17.3 % — ABNORMAL HIGH (ref 11.5–15.5)

## 2011-03-23 LAB — PROTIME-INR: Prothrombin Time: 15.3 seconds — ABNORMAL HIGH (ref 11.6–15.2)

## 2011-03-23 LAB — POCT CARDIAC MARKERS
CKMB, poc: <1 ng/mL — ABNORMAL LOW (ref 1.0–8.0)
Myoglobin, poc: 64.6 ng/mL (ref 12–200)

## 2011-03-27 LAB — BASIC METABOLIC PANEL
CO2: 23 mEq/L (ref 19–32)
Calcium: 8.7 mg/dL (ref 8.4–10.5)
Creatinine, Ser: 0.55 mg/dL (ref 0.4–1.2)
GFR calc Af Amer: >60 mL/min (ref >60)
GFR calc non Af Amer: >60 mL/min (ref >60)
Glucose, Bld: 131 mg/dL — ABNORMAL HIGH (ref 70–99)
Sodium: 137 mEq/L (ref 135–145)

## 2011-03-27 LAB — PROTIME-INR: INR: 2.6 — ABNORMAL HIGH (ref 0.00–1.49)

## 2011-03-28 LAB — DIFFERENTIAL
Basophils Absolute: 0.1 10*3/uL (ref 0.0–0.1)
Basophils Relative: 1 % (ref 0–1)
Eosinophils Absolute: 0.1 10*3/uL (ref 0.0–0.7)
Eosinophils Relative: 1 % (ref 0–5)
Lymphocytes Relative: 37 % (ref 12–46)
Monocytes Absolute: 0.5 10*3/uL (ref 0.1–1.0)

## 2011-03-28 LAB — CBC
HCT: 36.5 % (ref 36.0–46.0)
Hemoglobin: 12.2 g/dL (ref 12.0–15.0)
MCHC: 33.3 g/dL (ref 30.0–36.0)
MCV: 78.7 fL (ref 78.0–100.0)
Platelets: 339 10*3/uL (ref 150–400)
RDW: 16.9 % — ABNORMAL HIGH (ref 11.5–15.5)

## 2011-03-28 LAB — D-DIMER, QUANTITATIVE: D-Dimer, Quant: <0.22 ug/mL-FEU (ref 0.00–0.48)

## 2011-03-28 LAB — POCT I-STAT, CHEM 8
Calcium, Ion: 1.11 mmol/L — ABNORMAL LOW (ref 1.12–1.32)
HCT: 40 % (ref 36.0–46.0)
Hemoglobin: 13.6 g/dL (ref 12.0–15.0)
Sodium: 138 mEq/L (ref 135–145)
TCO2: 24 mmol/L (ref 0–100)

## 2011-03-28 LAB — APTT: aPTT: 34 seconds (ref 24–37)

## 2011-03-28 LAB — POCT CARDIAC MARKERS: Myoglobin, poc: 60.5 ng/mL (ref 12–200)

## 2011-04-04 ENCOUNTER — Encounter: Payer: Self-pay | Admitting: Internal Medicine

## 2011-04-21 ENCOUNTER — Other Ambulatory Visit: Payer: Self-pay | Admitting: Internal Medicine

## 2011-04-21 DIAGNOSIS — Z1231 Encounter for screening mammogram for malignant neoplasm of breast: Secondary | ICD-10-CM

## 2011-04-25 NOTE — Procedures (Signed)
Makayla Frazier, KOOP NO.:  [id number]   MEDICAL RECORD NO.:  [id number]          PATIENT TYPE:  OUT   LOCATION:  SLEEP CENTER                 FACILITY:  [facility]   PHYSICIAN:  Coralyn Helling, MD        DATE OF BIRTH:  1971/03/07   DATE OF STUDY:  03/31/2008                            NOCTURNAL POLYSOMNOGRAM   REFERRING PHYSICIAN:   REFERRING PHYSICIAN:  Leslye Peer, M.D.   INDICATION FOR STUDY:  [name] is a 40 year old female who has a  history of diabetes, asthma, obesity, and pulmonary embolism.  She also  has complaints of snoring, sleep disruption, and excessive daytime  sleepiness.  She is referred to the sleep lab for an overnight  polysomnogram.   HEIGHT:  5 feet, 0 inches.   WEIGHT:  196 pounds.   BMI:  38.   NECK SIZE:  15.   EPWORTH SLEEPINESS SCORE:  8.   MEDICATIONS:  Coumadin, Ultram, Metformin, Lantus, Advair, albuterol,  morphine, and glipizide.   SLEEP ARCHITECTURE:  Sleep period time was 420 minutes.  Total sleep  time was 197 minutes.  Sleep efficiency is 43%, which is significantly  reduced.  Sleep latency is 34.5 minutes, which is prolonged.  REM  latency is 306.5 minutes, which is prolonged.  The study was notable for  a lack of slow wave sleep and a reduction in the percentage of REM sleep  to 8% of the study.  The patient slept in both the supine and nonsupine  position.   RESPIRATORY DATA:  The average respiratory rate was 22.  The overall  apnea-hypopnea index was 2.4.  The events were exclusively obstructive  in nature.  The REM apnea-hypopnea index was 10.4.  The non-REM apnea-  hypopnea index was 0.7.  The supine apnea-hypopnea index was 1.4.  The  nonsupine apnea-hypopnea index was 0.6.  Occasional snoring was noted by  the technician.   OXYGEN DATA:  The baseline oxygenation was 95%.  The oxygen saturation  nadir was 87%.  The patient spent a total of one minute with an oxygen  saturation below 90% and 0.1 minutes  with an oxygen saturation below  88%.   CARDIAC DATA:  The average heart rate was 85, and the rhythm strip  showed a normal sinus rhythm.   MOVEMENT-PARASOMNIA:  The periodic limb movement index was 0.  No other  abnormal behaviors were noted.   IMPRESSIONS-RECOMMENDATIONS:  This study shows evidence for REM-related  sleep apnea, as her REM apnea-hypopnea index was 10.4; however, her  overall apnea-hypopnea index was only 2.4.  What I would recommend at  this time is that the patient should undergo counseling with regards to  the importance of diet, exercise, and weight reduction as well as  avoidance of alcohol and sedatives.  Positional therapy may be of  benefit to her as well.  If after implementing these measures, she is  still symptomatic, then further therapeutic options could include CPAP  therapy, oral appliance, or surgical intervention, and clinical  correlation would be necessary to determine what would be the best  treatment option.      Coralyn Helling, MD  Diplomat, Biomedical engineer of Sleep Medicine  Electronically Signed     VS/MEDQ  D:  04/02/2008 15:03:07  T:  04/02/2008 15:26:49  Job:  [id number]   cc:   Leslye Peer, MD  [address]  [place], [place] [address]

## 2011-04-25 NOTE — Discharge Summary (Signed)
Makayla Frazier, Makayla Frazier NO.:  [id number]   MEDICAL RECORD NO.:  [id number]          PATIENT TYPE:  INP   LOCATION:  5524                         FACILITY:  [facility]   PHYSICIAN:  Tacey Ruiz, MD    DATE OF BIRTH:  [birth year]   DATE OF ADMISSION:  01/29/2008  DATE OF DISCHARGE:  01/31/2008                               DISCHARGE SUMMARY   ADDENDUM:  [name] was, in fact, kept overnight, on January 30, 2008,  secondary to some nausea.  This was likely related to her Percocet  medication.  She has a history of nausea with taking Vicodin, as well.  Patient received two consecutive doses of Percocet for pain control  early on the morning of February 19 and was nauseated from that.  We  treated the patient symptomatically with Zofran and alternated her  Percocet and tramadol doses every 4 hours, so she would take tramadol,  four hours later Percocet and then four hours later Ultram, and nausea  resolved in that pattern.   We sent patient home on this alternate every-four-hour schedule as well  as with promethazine p.r.n. for nausea.   Patient will follow up in the outpatient clinic with Dr. [name] on  February 05, 2008, and Dr. [name] on March 02, 2008, for her Coumadin.   Patient remains stable overnight, labs are normal and vitals are normal.      Tacey Ruiz, MD  Electronically Signed     JP/MEDQ  D:  01/31/2008  T:  01/31/2008  Job:  [id number]

## 2011-04-25 NOTE — Discharge Summary (Signed)
Makayla Frazier, Makayla Frazier NO.:  [id number]   MEDICAL RECORD NO.:  [id number]          PATIENT TYPE:  OBV   LOCATION:  5506                         FACILITY:  [facility]   PHYSICIAN:  Alvester Morin, M.D.  DATE OF BIRTH:  11-10-1971   DATE OF ADMISSION:  09/02/2008  DATE OF DISCHARGE:  09/03/2008                               DISCHARGE SUMMARY   DISCHARGE DIAGNOSES:  1. Atypical chest pain.  2. Leg pain.  3. History of pulmonary embolism in January 2009, currently on      Coumadin therapy.  4. Diabetes mellitus type 2.  5. Depression and anxiety status post suicide attempt in August of      2009.  6. Asthma.  7. History of anemia.   DISCHARGE MEDICATIONS:  1. Famotidine 20 mg 1 tablet b.i.d.  2. Coumadin 5 mg.  This medication was placed on hold until followup      appointment with [facility] on September 04, 2008.  3. Advair 250/50 mcg 1 puff b.i.d.  4. Albuterol 90 mcg MDI one puff every 4 hours p.r.n.  5. Metformin 500 mg 1 pill b.i.d.  6. Glipizide 5 mg 1 pill daily.  7. Lantus 10 units subcu daily.   [name] is a 40 year old African American female with a past medical  history of recurrent noncardiac atypical chest pain as well as pulmonary  embolism in January of 2009.  She also has a history of  diabetes type  2, asthma, depression with recent suicide attempt in August of 2009, who  presented to the ER at [facility] September 23 with a complaint of  chest heaviness.  She stated that this pain started 2 days prior to  her arrival at the ER and described the pain as a constant discomfort in  the center of her chest without radiation that began while she was at  rest.  She described the pain as being very similar to chest pain that  she has had in the past.  She rated this pain a 6/10 when it initially  began, but worsened to a 10/10 particularly with deep inspiration.  She  did not describe any alleviating factors.  She admitted to some  associated diaphoresis with vomiting x1 on the initial presentation of  chest pain.  She denied any associated cough, fever, chills, abdominal  pain, nausea, diarrhea, dysuria, weakness/numbness/tingling in any of  her extremities.  She also complained of pain in her left lower  extremity that she localized to the calf, which she described as a  tingling sensation running from the anterior aspect of her foot to her  thigh, which has worsened over the past 1-2 days.  She denies noticing  any swelling, tenderness, or erythema of either lower extremity.  Her  vital signs on admission were as follows:  Temperature 98, blood  pressure 121/81, heart rate 82, respiratory rate 16, O2 saturations 99%  on room air.   ADDITIONAL LABORATORIES ON ADMISSION:  D-dimer 0.22, PT 44.8, INR 4.3,  white blood count 8.3, hemoglobin 12.9, platelets 346.  Sodium 138,  potassium 3.6, chloride 107,  COD2 of 25, BUN 7, creatinine 0.76.  Glucose 80.  Calcium 8.7.  Cardiac enzymes were as follows:  CK 159, CK-  MB 1.0, relative index 0.7, troponin-I less than 0.05.   HOSPITAL COURSE BY PROBLEM:  1. Atypical chest pain.  Serial cardiac enzymes were obtained and      remained within normal limits.  EKG was performed indicating normal      sinus rhythm.  Patient was admitted to the floor and given aspirin      81 mg, Ativan 1 mg, Zofran 4 mg and morphine 1-2 mg IV.  Her chest      pain improved with some morphine and she did not have any recurrent      episodes of 10/10 sharp pain, and she also remained without      vomiting or diarrhea. D-dimer was also obtained and found to be      negative at 0.22.  CXR was also obtained indicating chronic lung      changes and no acute process.  It was felt that her chest pain was      not due to acute process, either cardiac or recurrent pulmonary      embolism.  Patient will follow up on this with her primary care      Aldrin Engelhard.  2. Leg pain. Patient continued to complain of  mild leg pain radiating      from the anterior aspect of her foot up to her thigh.  She did not      develop any signs or symptoms of acute DVT.  It remains unclear if      her pain was musculoskeletal in nature, related to neuropathy      secondary to her diabetes mellitus, or psychogenic in nature.  She      will follow up on this with her primary care Kadeja Granada.  3. History of PE.  Patient's PT and INR remained elevated above      therapeutic level while she was admitted overnight.  D-dimer was      negative and acute pulmonary embolism was not considered likely      etiology related to her complaint of chest pain.  She was told to      discontinue her Coumadin pending followup with Dr. [name] in the      Coumadin Clinic at her scheduled appointment tomorrow at 11:00 a.m.  4. Diabetes mellitus type 2.  This condition was stable throughout the      course of admission.  The patient was maintained on sliding scale      insulin and Lantus.  She will continue her regular medication      regimen on discharge home.  5. Depression with anxiety.  The patient denied suicide ideation and      homicide ideation.  She has a scheduled appointment for October 1      with her psychiatrist.  We strongly recommended she keep this      appointment and follow up for treatment of her depression and      anxiety at that time as she is currently not on any medication.  6. Patient was continued on her regular home medications of albuterol,      Advair and Atrovent.  7. History of anemia.  Patient was not anemic throughout the course of      this hospitalization.   DISPOSITION AND FOLLOWUP:  The patient will follow up with Dr. [name] at  the [facility]in Clinic  tomorrow, September 04, 2008 at 11:00 a.m.  She will  also follow up with Dr. [name], her primary care Cecilio Ohlrich on September 18, 2008.  She will follow up with her psychiatrist, Dr. Lang Snow, on September 10, 2008.   Her vital signs on discharge were as  follows:  Temperature 98.2, blood  pressure 113/73, heart rate 91, respiratory rate 18, O2 saturation 95%  on room air.  PT and INR at the time of discharge were 51.5 and 5.0.  Cholesterol was 162, triglycerides 60, HDL 45, LDL 105. V:LDL 12.  Sodium 137, potassium 3.9, chloride 105, CO2 was 24, BUN 5, creatinine  0.75, glucose 143.  Total bili 0.4, alk phos 52, AST 26, ALT 35, total  protein 6.6, albumin 3.1, calcium 8.4, magnesium 2.0.   Patient was discharged home with instructions to follow up at her  shielded appointments.   Dictated by Nelda Bucks, MS IV      Joaquin Courts, MD      Alvester Morin, M.D.  Electronically Signed    VW/MEDQ  D:  09/03/2008  T:  09/03/2008  Job:  [id number]   cc:   [name], D.O.  Fax: [phone]

## 2011-04-25 NOTE — H&P (Signed)
Makayla Frazier, Makayla NO.:  [id number]   MEDICAL RECORD NO.:  [id number]          PATIENT TYPE:  IPS   LOCATION:  0307                          [facility]ILITY[facility]  [facility]   PHYSICIAN:  Geoffery Lyons, M.D.      DATE OF BIRTH:  1970/12/30   DATE OF ADMISSION:  07/23/2008  DATE OF DISCHARGE:                       PSYCHIATRIC ADMISSION ASSESSMENT   DATE/TIME OF ASSESSMENT:  July 24, 2008 at 11:45 a.m.   IDENTIFYING INFORMATION:  This is a 40 year old [place] American female,  single.  This is a voluntary admission.   HISTORY OF PRESENT ILLNESS:  The patient's chief complaint, I took a  lot of pills.  Makayla Frazier presented in the emergency room on August 13  after taking approximately 20 tablets of Coumadin, believed to be 5 mg,  along with several tablets of Ultram 50 mg, ibuprofen and some Beano.  She says that these were all medicines that were sitting on top of her  dresser but that she had also been contemplating suicide for  approximately 2 months.  She says that she has been going through a lot  since January 2009, when she discovered chest pain and was found to have  bilateral pulmonary emboli.  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  She has been unable to  return to work, unable to obtain a release to return to her job because  of safety issues with the Coumadin.  Meanwhile she has had considerable  chest pain and because she has no income has difficulty buying enough of  the pain medicine to keep her pain under control.  She endorses 2 months  of depressed mood, feeling hopeless, anhedonia and reclusiveness,  staying in her room most of the time,  lying around in bed a lot.  She  has had 2 weeks of more intense crying spells, strong hopelessness, and  some increased irritability.  She does not get along well with her  mother, with whom she lives, and her mother  has told her that she wishes  she were not in the house.  Meanwhile she has also broken up with her  boyfriend of 2 years.   PAST PSYCHIATRIC HISTORY:  She is not currently receiving any outpatient  care.  First [facility] admission.  She has a history of one prior overdose in  1991 due to difficulty in handling some acute gallbladder symptoms that  she was having.  She subsequently had surgery in 1991 and later stopped  treatment at [facility].  She denies any history of  substance abuse, says she will rarely drink one mixed drink when she is  celebrating with her friends for a special occasion.  No past history of  substance abuse.  She has taken some medications in the past but is not  clear on what they were.  She has no prior inpatient psychiatric  admissions.  She denies a history of physical abuse.  She endorses  emotional distress with a mother who is  irritable and distant.  Denies a  history of sexual abuse.  No history of hallucinations, brain injury or  learning disabilities.   SOCIAL HISTORY:  Single African American female, has been in a stable 2-  year relationship that she wants to maintain.  She is not sure why her  boyfriend broke it off, still having a lot of stress with this.  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  Currently her car is at risk for being repossessed.  She has no money to  pay for medications.  Only supports at this point are a girlfriend, and  her boyfriend was supportive until the breakup.   FAMILY HISTORY:  Denies a family history of mental illness or substance  abuse.   MEDICAL HISTORY:  She is followed by Dr. Anderson Malta, M.D., in the  [facility] outpatient clinic.  Current problems  include status post warfarin overdose, pulmonary embolism, obstructive  sleep apnea, obesity, diabetes mellitus type 2.  Past  medical history  significant for cholecystectomy in 1991, and 1 prior overdose attempt,  also in 1991.   CURRENT MEDICATIONS:  1. Ultram 50 mg daily.  2. Coumadin 5 mg daily.  3. Metformin 500 mg daily.  4. Flovent inhaler 110 mcg 1 puff b.i.d.  5. Lantus insulin 30 units subcu daily.  6. Glipizide 5 mg p.o. daily.  7. Advair Diskus 1 puff b.i.d., dose unclear.   PHYSICAL EXAMINATION:  Physical exam was done in the emergency room,  where her initial INR was 1.6.  She is an obese [place] American female  who is in no distress.  Her vital signs are within normal limits here on  our unit.   DIAGNOSTIC STUDIES:  Urine pregnancy test negative.  Urinalysis within  normal limits.  CBC:  WBC 7.9, hemoglobin 12.4, hematocrit 38.6,  platelets 365,000 and MCV 71, INR 1.6.  Acetaminophen and salicylate  levels negative.  Alcohol level less than 5e.  Chemistry within normal  limits with random glucose at 114.   MENTAL STATUS EXAM:  Obese female.  Pleasant, cooperative.  Eye contact  is fairly good until she starts to talk about her stressors, then eye  contact becomes poor.  She is very tearful, appears to be very depressed  but appropriate.  Gives a coherent history.  Speech is articulate but  soft in tone.  Normal production and form.  Mood is depressed and  hopeless.  Thought process logical and coherent.  Thinking is linear,  goal-directed.  When asked if she is glad she survived her suicide  attempt, she says she is not sure.  She mostly thinks it was stupid.  Admits to thinking about suicide for the past 2 months.  No homicidal  thoughts.  Feeling hopeless with nowhere to turn, to be able to earn any  income or get education to be retrained.  Cognition is fully preserved.   AXIS I:  Major depression, initial episode, severe.  AXIS II:  Deferred.  AXIS III:  Status post warfarin overdose, pulmonary embolism,  obstructive sleep apnea, diabetes mellitus type 2 and obesity.  AXIS IV:   Severe issues with relationship stress and lack of income.  AXIS V:  Current 42, past year 69+.   Estimated plan is to voluntarily admit her to alleviate her depression  and suicidal thoughts.  Because of her problem with the chest pain, we  are going to put her on some regular dosing of the Ultram and have  talked to her about this, given her some flexibility with the dosing,  but we will start with 50 mg q.i.d.  We are going to obtain a STAT PT,  PTT and INR, and pharmacy will manage her coagulation protocol.  Meanwhile, we are talking about referring her for counseling.  We are  going to hold off on starting an antidepressant now.  She admits that  her medication compliance is less than ideal, and she is very concerned  about paying for medication.  We are hoping to make some  contact possibly with her mother and to gauge the level of support.  Meanwhile we have had a phone conversation with her primary care  practitioner, whom we advised that she is here, and will continue to  coordinate for followup care.      Margaret A. Scott, N.P.      Geoffery Lyons, M.D.  Electronically Signed    MAS/MEDQ  D:  07/24/2008  T:  07/24/2008  Job:  [id number]

## 2011-04-25 NOTE — Discharge Summary (Signed)
NAMERYAN, PALERMO NO.:  [id number]   MEDICAL RECORD NO.:  [id number]          PATIENT TYPE:  IPS   LOCATION:  0307                          [facility]ACILITY:  [facility]   PHYSICIAN:  Jasmine Pang, M.D. DATE OF BIRTH:  [birth year]   DATE OF ADMISSION:  07/23/2008  DATE OF DISCHARGE:  07/27/2008                               DISCHARGE SUMMARY   IDENTIFYING INFORMATION:  This is a 40 year old single African American  female who was admitted on a voluntary basis on July 24, 2008.   HISTORY OF PRESENT ILLNESS:  The patient's chief complaint was I took a  lot of pills.  Makayla Frazier presented in the emergency room on August 13th  after taking approximately 20 tablets of Coumadin, believed to be 5 mg  along with several tablets of Ultram 50 mg, ibuprofen, and some beano.  She says that these all medications were sitting on top of her dresser,  but she had been contemplating suicide for approximately 2 months.  She  says she has been going through a lot since January 2009, when she began  to have chest pain.  She was found to have bilateral pulmonary emboli.  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  She has been unable to return to work, unable to obtain a release to  return to her job because of safety issues with the Coumadin.  Meanwhile, she had considerable chest pain and because she has no  income, she has had difficulty buying enough of the pain medication to  keep her pain under control.  She endorses 2 months of depressed mood,  feeling hopeless, anhedonia, reclusiveness, staying in her room most of  the time, lying around in bed a lot.  She has had 2 weeks of more  intensive crying spells at home, hopelessness, and some increased  irritability.  She does not get along well with her mother with whom she  lives.  Her mother is told her that she wishes she was not in the  house.  Meanwhile, she has also broken up with her boyfriend of 2 years.   PAST PSYCHIATRIC HISTORY:  The patient is not currently receiving any  outpatient care.  This is the first [facility] admission for the patient.  She  has a history of 1 prior overdose in 1991 due to difficulty in handling  some acute gallbladder symptoms that she was having.  She subsequently,  had a surgery in 1991 and later stopped treatment at [facility].  She denies any history of substance abuse.  She  says she will rarely drink one mixed drink when she celebrating with her  friends for special occasion.  There is no past history of substance  abuse.  She has taken some medications in the past, but is not clear  what they were.  She has had no prior inpatient psychiatric admissions.  She denies a history of physical abuse.  She endorses emotional abuse  with the mother who  was irritable and distant.  She denies a history of  sexual abuse.  There is no history of hallucinations or brain injuries  or learning disabilities.   FAMILY HISTORY:  The patient denies family history of mental illness or  substance abuse.   MEDICAL HISTORY:  The patient is currently status post for an open  overdose.  She also has pulmonary embolism, obstructive sleep apnea,  obesity, and diabetes mellitus type 2.  Past medical history is  significant for cholecystectomy in 1991 at one prior overdose attempt in  1991.   CURRENT MEDICATIONS:  1. Ultram 50 mg daily.  2. Coumadin 5 mg daily.  3. Metformin 500 mg daily.  4. Flovent inhaler 110 mcg one puff p.o. b.i.d.  5. Lantus insulin 30 units subcutaneous daily.  6. Glipizide 5 mg p.o. q.day.  7. Advair discus 1 puff b.i.d., dose unclear.   PHYSICAL FINDINGS:  There were no acute physical or medical problems  noted.   DIAGNOSTIC STUDIES:  Urine pregnancy test was negative.  Urinalysis was  within normal limits.  CBC was remarkable for WBC 7.9, hemoglobin  12.4,  hematocrit 38.6, and platelets 365,000.  MCV was 71.  INR was 1.6.  Acetaminophen and salicylate levels were negative.  Alcohol level was  less than 5.  Chemistry panel was within normal limits with random  glucose at 114.   HOSPITAL COURSE:  Upon admission, the patient was continued on her  Flovent inhaler 2 puffs b.i.d., metformin 500 mg daily, Lantus insulin  30 units subcutaneous daily, glipizide oral 5 mg p.o. q.day and Advair  discus inhaler 1 puff b.i.d.  She was also started on a sliding scale  glycemic control protocol.  In addition, she was started on Ultram 50 mg  q.i.d. and 50 mg p.o. b.i.d. p.r.n. pain.  She had PT/PTT and INR done  throughout the hospitalization and her Coumadin dose was managed by our  Pharm D.  The INR initially was 3.9 and Coumadin was held as the INR  state elevated most of the hospitalization due to her Coumadin overdose.  On July 25, 2008, she was started on trazodone 50 mg p.o. q.h.s.  p.r.n. insomnia, may repeat x1.  In individual sessions with me, the  patient was sad and depressed and reserved.  She was very tearful.  She  discussed her multiple stressors recently as per history of present  illness.  She states she lives with her mother and this relationship is  very difficult.  She states her mother does not want her to be living  there.  She almost had a car repossessed.  In addition to her medical  problems with a pulmonary embolism, she states she also has no support  from her father.  On July 25, 2008, a family session was held with the  patient, the patient's mother, and the patient's ex-boyfriend.  The  patient had a flat affect throughout the session and did not make eye  contact with her mother or ex-boyfriend.  She would look to the  therapist in the floor and was frequently tearful.  The patient's mother  stated that all the problems they have are round the patient not liking  the mother's boyfriend.  The patient stated that  he is disrespectful to  her and her mother confirmed this, but stated she was not going end the  relationship.  Both the patient and mother stated that they had little  communication.  The patient spoke about wanting to  have a relationship  begin with her ex-boyfriend and he stated he was unsure.  Both agreed  to couples' counseling to look at issues of trust and see where the  relationship will go.  The patient stated she no longer feels suicide,  but still felt hopeless and sad.  Family was not sure if she would be  safe returning home at this time and she was not discharged.  She was  having difficulty falling asleep and middle of the night awakening,  which is why the trazodone was begun as above.  On July 26, 2008, the  patient was less depressed and less anxious.  Sleep was improved with  the trazodone.  She began to look forward to being discharged.  On  July 27, 2008, mental status had improved from admission status.  The  patient's mood was euthymic.  Affect wide range.  There was no suicidal  or homicidal ideation.  No thoughts of self-injurious behavior.  No  auditory or visual hallucinations.  No paranoia or delusions.  Thoughts  were logical and goal-directed.  Thought content no predominant theme.  Cognitive was grossly intact.  Insight was good.  Judgment was good.  It  was felt the patient was safe for discharge today.   DISCHARGE DIAGNOSES:  Axis I:  Major depression recurrence, severe  without psychosis.  Axis II:  None.  Axis III:  Status post warfarin overdose, pulmonary embolism,  obstructive sleep apnea, diabetes mellitus type 2, and obesity.  Axis IV:  Severe (issues with relationship stress and lack of income).  Axis V:  Global assessment of functioning was 50 upon discharge.  GAF  was 42 upon admission.  GAF highest past year was 69+.   DISCHARGE PLANS:  There was no specific activity level or dietary  restrictions.   POSTHOSPITAL CARE PLANS:  The patient  will see Dr. Joni Reining at the  [facility] on August 20th at 8:30 a.m.  She will also go to [facility] for counseling.  She will return to her doctor at [facility] on August 24th at 1:30 p.m. to manage her PE  and Coumadin therapy.   DISCHARGE MEDICATIONS:  1. Ultram as directed by her primary care doctor.  2. Coumadin 5 mg daily with blood work to be done within the next      couple of days.  3. Glucophage 5 mg daily.  4. Glucotrol 5 mg daily.  5. Flovent 220 mcg, 1-2 p.o. b.i.d. p.r.n. shortness of breath.  6. Trazodone 50 mg at bedtime.  7. Lantus insulin 10 units at 8:00 a.m.   Coumadin clinic on Monday July 31, 2008.      Jasmine Pang, M.D.  Electronically Signed     BHS/MEDQ  D:  07/27/2008  T:  07/28/2008  Job:  [id number]

## 2011-04-25 NOTE — Discharge Summary (Signed)
Makayla Frazier, [name]:  [id number]   MEDICAL RECORD NO.:  [id number]          PATIENT TYPE:  INP   LOCATION:  3703                         FACILITY:  [facility]   PHYSICIAN:  [name], D.O.DATE OF BIRTH:  12-11-1971   DATE OF ADMISSION:  12/27/2007  DATE OF DISCHARGE:  01/04/2008                               DISCHARGE SUMMARY   DISCHARGE DIAGNOSES:  1. Pulmonary embolus.  2. Diabetes mellitus type 2.  3. Asthma.  4. Anemia.  5. Hypokalemia.   DISCHARGE MEDICATIONS:  1. Flovent 110 mcg one puff b.i.d.  2. Glipizide 5 mg p.o. once a day with food.  3. Tramadol 50 mg p.o. b.i.d.  4. Coumadin 5 mg p.o. each day.  5. Lantus 35 units injected subcutaneously each day before bed.  6. Lidocaine patch 5% to be applied to the right chest wall for 12      hours and then removed for 12 hours before reapplying.   DISPOSITION/FOLLOWUP:  Condition to the patient's pulmonary embolism  which is now being treated with chronic Coumadin therapy with a  therapeutic INR, several other chronic medical conditions were newly  identified during this admission.  Accordingly, she will follow up with  Dr. [name] in the Coumadin clinic in 3-4 days for management of her  Coumadin therapy and monitoring of her INR.  Additionally, she will  follow up with Dr. [name] who will be her new primary care physician in  approximately two weeks.  At this visit, she will continue to modify her  outpatient management of her newly diagnosed diabetes as well as her  asthma.   STUDIES:  1. CT chest performed on December 27, 2007, demonstrated large      bilateral central pulmonary embolisms.  2. A 2D echocardiogram performed on December 27, 2007, demonstrated      normal left ventricular function with a normal ejection fraction.  3. Lower extremity Dopplers performed on December 28, 2007, were      negative for DVT bilaterally.  4. CT abdomen, pelvis and chest performed on January 03, 2008,      revealed pulmonary embolus as noted before, right lower lobe      infarct, and was otherwise normal.   CULTURES:  Urine culture performed on January 01, 2008, was negative.   BRIEF HISTORY AND PHYSICAL:   HISTORY OF PRESENT ILLNESS:  This is a 40 year old obese African  American female with minimal previous medical care who presented with  four day history of shortness of breath, pleuritic chest pain and right  arm numbness.  The pain would come and go, particularly when she would  go out in the cold air.  She is taking only oral contraceptives and no  other medications.  She denied fever, chills, leg pain, leg swelling,  nausea, vomiting and diarrhea.  She has no prior history of DVT or other  embolic event.   PAST MEDICAL HISTORY:  1. History of D&C for treatment of menorrhagia performed in January      2002.  2. Needle stick in December 2006.   PAST MEDICATION:  Oral  contraceptive.   SOCIAL HISTORY:  This patient has never smoked cigarettes, nor does she  drink alcohol or use any other drugs.  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   REVIEW OF SYSTEMS:  Negative except as noted in the HPI.   ADMITTING PHYSICAL EXAMINATION:  VITAL SIGNS:  Temperature 98.7, blood  pressure 154/105, pulse 105, respirations 20, oxygen saturation 99% on  room air.  GENERAL:  The patient was in no acute distress with nasal cannula in  place.  Was pleasant and speaking in complete sentences.  HEENT:  Eyes:  PERRL.  EOMI.  ENT:  Oropharynx was clear without exudate  or erythema.  NECK:  JVD was difficult to assess in this patient due to obese neck.  RESPIRATORY:  Decreased breath sounds globally.  There was no wheeze but  here were slight crackles heard at the bases bilaterally.  CARDIOVASCULAR:  Regular rate and rhythm with no murmurs, rubs or  gallops.  GI:  Abdomen was soft,  nontender, nondistended with positive bowel  sounds.  EXTREMITIES:  Warm and dry with no cyanosis, clubbing or edema.  There  were no palpable cords or obvious swelling.  Homan's sign was negative  bilaterally.  SKIN:  Warm and dry.  NEUROLOGICAL:  Cranial nerves II-XII were grossly intact.  There was 5/5  strength in all extremities, and sensation was grossly intact  throughout.  PSYCH:  Appropriate.   LABORATORY DATA:  Admitting:  Sodium 137, potassium 3.4, chloride 103,  bicarbonate 27, BUN 5, creatinine 0.9, blood sugars 297, white blood  cells 10.4, hemoglobin 13.3, platelets 270, MCV 78.  D. dimer was 2.4.  Pregnancy test was negative.  Point of care cardiac markers were  negative.   PROBLEMS:  #1- PULMONARY EMBOLISM.  The patient is currently stable and  is saturating well on room air.  She does complain of some shortness of  breath, but this is similar to her baseline.  Her shortness of breath is  likely a combination of symptoms from her pulmonary embolism as well as  her newly diagnosed asthma.  The patient's INR has been therapeutic for  several days and is slightly supratherapeutic today at 3.2.  However,  she will follow up in the Coumadin Clinic shortly for adjustment of her  dose should this be needed.   #2 - ASTHMA.  The patient has never had a diagnosis of asthma.  However,  upon further questioning her family history was significant for asthma,  seasonal allergies as well as eczema.  The patient also has had  complaint of allergy and eczema in the past.  Accordingly, she received  PFT's before and after bronchodilator treatment which showed a  significant reversible pattern of obstructive lung disease.  She has  been started on Flovent for this.  She will require further follow up  for management of her asthma in the outpatient setting.   #3 - DIABETES MELLITUS TYPE 2.  The patient's diabetes was also newly  diagnosed upon this admission when she had several  blood sugars in 300s.  She was started on Lantus as well as oral hypoglycemics.  During the  last few days of admission, her blood sugars have been excellently  controlled between 100 and 140 on q.h.s. Lantus and oral glipizide.  She  will follow up in the outpatient setting for further adjustment of this  regimen.   #4 - CHEST WALL PAIN.  The patient has had an ongoing complaint of right  chest wall pain reproducible with palpation.  She was unable to tolerate  narcotics due to nausea and vomiting.  However, recently we have tried a  Lidoderm patch, and this has been very effective in controlling her  pain, and she will go home with this.  Of note, this stress pain is  likely secondary to the pulmonary infarct of her right lower lobe which  was identified on CT shortly before discharge.   #5 - ANEMIA.  The patient's hemoglobin has been stable during admission  between 11 and 12.  Given the patient's history of menorrhagia and her  heavy periods at home, it is likely that this is the source of her  anemia.  The patient was previously being treated with OCPs for this  problem.  However, given her recent PE, she will have to discontinue  this medication.  She will need to follow up with her OB/GYN to identify  strategy of controlling her menorrhagia that does not involve OCPs.   #6 - HYPOKALEMIA.  The patient's potassium was slightly low today at  3.3.  However, this has been repleted for discharge with oral potassium.   #7 - DYSPHAGIA.  The patient did have dysphagia with solids before  coming into the hospital and has continued to have this discomfort with  swallowing solid foods during the early part of her admission.  This has  resolved at this point.  It is unclear to me whether she was having some  gastrointestinal discomfort such as GERD or whether this was in some way  due to her pulmonary embolism.  Regardless, the symptoms have resolved  and can be addressed in the outpatient  setting should they recur.   #8 - HYPERTENSION.  The patient was hypertensive upon admission, but her  blood pressures have been well controlled without medication during the  latter part of her admission.  Regardless, give her newly diagnosed  diabetes, this will be something we will want to monitor and control,  and I believe that an ACE inhibitor would be an appropriate therapy for  her, but that we will wait until she is seen in the outpatient setting  to make this decision.     ______________________________  Glyn Ade    ______________________________  [name], D.O.    MA/MEDQ  D:  01/04/2008  T:  01/05/2008  Job:  [id number]   cc:   [name], D.O.

## 2011-04-25 NOTE — Procedures (Signed)
CLINICAL HISTORY:  The patient is a 40 year old female, admitted for  syncope.  She had a history of asthma, diabetes, pulmonary emboli,  depression, and sleep apnea.   CURRENT MEDICATIONS:  Protonix, Coumadin, colchicine, Lovenox, B12,  Plavix, Zocor, Provigil, Norvasc, Tylenol, labetalol, and potassium.   TECHNICAL COMMENT:  An 18-channel EEG was performed based on standard  international 10-20 system.  Total recording time is 21.6 minutes.  The  17 channel dedicated to EKG demonstrated normal sinus rhythm of 66 beats  per minute.   Upon awakening, the posterior background activity was well developed,  symmetric, reactive to eye opening and closure in 10 Hz, there was no  epileptiform discharge recorded.  Photic stimulation with flash  frequency of 1-19 Hz was performed.  There was no abnormality elicited.   The patient was able to achieve stage II sleep during the recording as  evident by sleep spindles and vertex waves.   IN CONCLUSION:  This is a normal awake and sleep EEG.  There is no  evidence of epileptiform discharge.      Levert Feinstein, MD  Electronically Signed     [id number]  D:  11/30/2008 16:01:23  T:  12/01/2008 05:43:03  Job #:  [id number]

## 2011-04-25 NOTE — Discharge Summary (Signed)
Makayla Frazier, Makayla Frazier NO.:  [id number]   MEDICAL RECORD NO.:  [id number]          PATIENT TYPE:  INP   LOCATION:  4733                         FACILITY:  [facility]   PHYSICIAN:  Chauncey Reading, D.O.  DATE OF BIRTH:  1971-06-26   DATE OF ADMISSION:  11/26/2008  DATE OF DISCHARGE:  11/30/2008                               DISCHARGE SUMMARY   DISCHARGE DIAGNOSES:  1. Syncope, attributed to hypoglycemia with possible surreptitious      insulin use.  2. History of pulmonary embolism January 2009 on Coumadin with chronic      chest pain and history of syncope.  3. Diabetes type 2 (hemoglobin A1c 6.0 on November 02, 2008).  4. History of major depressive disorder with suicide attempt in 2009      by overdose.  5. Obstructive sleep apnea.  6. Dysfunctional uterine bleeding.  7. Asthma.   DISCHARGE MEDICATIONS:  1. Coumadin 5 mg tablets to take with the following schedule; Monday      1/2 tablet, Tuesday 1 tablet, Wednesday 1/2 tablet, Thursday 1      tablet, Friday, 1/2 tablet, Saturday 1 tablet, and Sunday 1 tablet.  2. Albuterol inhaler 1-2 puffs up to every 6 hours as needed for      shortness of breath.   The patient was instructed to stop taking the following medications:  Lantus, glipizide, metformin, and Advair.   The patient was instructed to check her sugars daily and any time that  she feels dizzy or like she might pass out.   DISCHARGE CONDITION AND FOLLOWUP:  The patient was discharged in stable  condition.  Life-threatening causes of syncope had been ruled out.  She  did note some dizziness on the day of admission, but she had negative  orthostatics and her CBG was 91.  Her cosyntropin stimulation test  reveals possible secondary adrenal insufficiency likely from her Advair,  although this was borderline.  Her ACTH level was normal.  Her syncope  was attributed to possible surreptitious insulin use.  She is to follow  up with Dr. [name] in the  outpatient clinic on December 28, 2008 at 3:10  p.m.Marland Kitchen  At this time, she is to bring a record of her daily blood sugars  as well as blood sugars any time that she feels dizzy or like she might  pass out.  In addition given her borderline abnormal ACTH stimulation  test, it might be worth rechecking this now that she has stopped using  her Advair for some time.   In addition she is to follow up with Dr. Alexandria Lodge in the Coumadin Clinic on  December 07, 2008 at 11:30 a.m. for a check of her INR and dose  adjustment of her Coumadin.   PROCEDURES:  1. CT of head without contrast dated November 26, 2008 showed mild      atrophy, but no acute intracranial abnormality.  2. Cervical spine films dated November 26, 2008 showed no evidence for      fracture on lateral projections.  3. A 2-D echo dated November 27, 2008 shows ventricular ejection  fraction 55% with inadequate study to exclude or evaluate regional      wall motion.  4. EEG dated November 30, 2008 showed a normal, awake, and sleep EEG      with no evidence of epileptiform discharge.   BRIEF ADMITTING HISTORY AND PHYSICAL:  For full details, please refer to  the hospital chart, but in brief, this is a 40 year old female with past  medical history significant for diabetes type 2 and history of pulmonary  embolism on Coumadin who came to clinic for an appointment and was found  down in the clinic exam room by her physician, Dr. [name].  Her CBG at  that time was 66.  She was found to be in the lateral decubitus position  salivating, with cold and clammy fingers, twitching, unresponsive, would  not open eyes to loud voice or sternal rub.  She had a pulse and was  breathing.  Neck was stabilized with a C-collar.  Pulse ox was 99%.  After a tube of glucose, the patient became responsive.   PHYSICAL EXAMINATION:  VITAL SIGNS:  Temperature 98.4, blood pressure  147/96, pulse 72, respirations 20, and oxygen saturation 98% on room  air.   HEENT:  Extraocular motions were intact.  Pupils were equal, round, and  reactive to light.  NECK:  Supple.  LUNGS:  Clear to auscultation bilaterally.  HEART:  Regular rate and rhythm with no murmurs.  ABDOMEN:  Soft and depressible.  Bowel sounds were present.  She was  nontender.  EXTREMITIES:  No edema.  There was no tenderness in her calves.  SKIN:  No rashes.  NEUROLOGIC:  She was alert and oriented x3.  Cranial nerves II through  XII were intact, 5/5 strength throughout.  Normal sensation and  cerebellum was intact.   LABORATORIES AND STUDIES:  Sodium 135, potassium 6.1, chloride 99,  bicarb 25, BUN 5, creatinine 0.8, glucose 95.  Liver function tests were  significant for bilirubin 1.4, AST 46, ALT 37.  Point of care cardiac  markers were negative.  White blood count 9.6, hemoglobin 13.6,  platelets 406.  INR 2.2.  Cervical spine films and head CAT scan were  obtained with the findings described above.   HOSPITAL COURSE:  1. Syncope.  The patient was admitted for observation and life-      threatening causes of syncope were ruled out.  Her prolactin level      was found to be 20 and her EEG was normal making seizure relatively      unlikely.  Her alcohol level was checked and was less than 5.  Her      urine drug screen was negative.  Her TSH was 3.073 with a normal      T4.  Her random cortisol level was found to be 2.0 at midnight and      a couple days later, it was 5.9 at 7 a.m.  Her ACTH stimulation      test results were 8.0 at baseline, 17.8 at 30 minutes, and 19.8 at      60 minutes with a reference range for the 30 and 60-minute      measurements being greater than 20.  Her ACTH level was checked and      found to be 17, which is within normal limits.  Cardiac markers      were checked and were negative and her 2-D echo was normal as      described above.  Orthostatic vital  signs were checked and were      negative.  In fact, her blood pressure increased from 154  systolic      while lying down to 161 while standing as appropriate.  Her pulse      ranged from 60-77.  Given her negative workup and her history, the      patient was presumed to have had syncope due to hypoglycemia      possibly from surreptitious insulin use.  She was instructed not to      use insulin and to check her blood sugars daily.  She will have the      followup outlined above.  She was instructed to stop using Advair      in case this had been contributing to her borderline adrenal      insufficiency.   DISCHARGE LABORATORIES AND VITALS:  Temperature 97.5, pulse 75,  respirations 20, blood pressure 116/73.  White blood count 10.3,  hemoglobin 11.9, platelets 347.  Sodium 138, potassium 3.8, chloride  106, bicarb 27, glucose 96, BUN 4, creatinine 0.75, calcium 8.7.      Loel Dubonnet, MD  Electronically Signed      Chauncey Reading, D.O.  Electronically Signed    PN/MEDQ  D:  12/04/2008  T:  12/04/2008  Job:  [id number]   cc:   Outpatient Clinic

## 2011-04-25 NOTE — Discharge Summary (Signed)
Makayla Frazier, Makayla Frazier NO.:  [id number]   MEDICAL RECORD NO.:  [id number]          PATIENT TYPE:  INP   LOCATION:  5524                         FACILITY:  [facility]   PHYSICIAN:  Tacey Ruiz, MD    DATE OF BIRTH:  [birth year]   DATE OF ADMISSION:  01/29/2008  DATE OF DISCHARGE:  01/30/2008                               DISCHARGE SUMMARY   CHIEF COMPLAINT:  Chest pain.   ATTENDING:  Clydie Braun, MD.   DISCHARGE DIAGNOSES:  1. Chest pain likely secondary to resolving pulmonary embolism.  2. History of PE108 on Coumadin, hypocoagulable workup negative.  3. Diabetes type 2.  4. History of anemia.  5. Hypokalemia.  6. Asthma.   DISCHARGE MEDICATIONS:  1. Coumadin.  Continue dosing as previously instructed by Dr. Alexandria Lodge in      the Coumadin Clinic.  2. Lantus 30 units at bedtime.  3. Tramadol p.r.n.  4. Flovent p.r.n.  5. Metformin 500 mg, two pills b.i.d.  6. Advair 250/50 b.i.d.  7. Percocet 5/325, one to two tabs q.4h. p.r.n., dispense 2 weeks      worth.   SOCIAL HISTORY:  1. The patient is not a smoker.  2. No alcohol.  3. No cocaine.  4. Patient is single.  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  6. Lives with her mother.  7. She has self-pay insurance.   FAMILY HISTORY:  1. Positive for diabetes in her mother.  2. Bone cancer in her father.   REVIEW OF SYSTEMS:  Positive for chills, chest pain, dyspnea on  exertion.  The chest pain is pleuritic in nature, substernal, occurs  mostly on the right.  Patient has positive cough.  Denies hemoptysis,  nausea, vomiting, diarrhea.  The patient does have some left leg  numbness.  Negative except as noted in the HPI.   PHYSICAL EXAM:  VITALS ON ADMISSION:  Temperature 97.1, blood pressure  133/98, pulse 82, respiratory rate 22, O2 sat 99% on room air.  GENERAL:  The patient is alert and oriented x3, not in any acute  distress.  EYES:  Pupils even, equally reactive to light and accommodation.  Extraocular  movements intact.  ENT:  Mucous membranes are moist.  NECK:  No LAD, no bruits.  RESPIRATORY:  Clear to auscultation bilaterally with decreased air  movement at the bilateral bases, right worse than left.  Patient had  normal respiratory effort.  CARDIOVASCULAR:  Regular rate and rhythm.  No murmurs, rubs or gallops.  GI:  Obese, positive bowel sounds, soft, nontender, no distention.  CALF:  Right calf is larger than left calf.  HOMAN'S SIGN:  Positive on the right.  There were no palpable cords, no erythema, 2+ dorsalis pedis pulses  bilaterally.  LYMPH:  No cervical LAD.  MUSCULOSKELETAL:  Strength is 5/5.  NEURO:  Grossly intact.  PSYCH:  Mood is appropriate, oriented, no audiovisual hallucinations.   LABS ON ADMISSION:  Hemoglobin 16.2.  Electrolytes:  Sodium 133,  potassium 4.7, chloride 110, bicarb 21, BUN 6, creatinine 0.8, glucose  66, bilirubin 0.7, alkaline phosphatase 57, AST 29, ALT 44, protein  7,  albumin 3.3, calcium 9.  UDS positive for opiates.  Urine pregnancy test  is negative.  CK 82, MB 0.7, troponin 0.02, INR 2.3, PT 25.7.  Point of  care cardiac enzymes are negative x1.  CT angio showed bilateral PEs  which are resolving, no new emboli and a possible small new right lower  lobe infarct.   HOSPITAL COURSE BY PROBLEM:  1. Bilateral pulmonary embolus.  CT angio showed there is no new      pulmonary embolus and the bilateral pulmonary embolus seen in      January of 2009 appeared to be resolving.  It was unclear, although      a full workup was done, why these pulmonary embolus appeared to      begin with, but it was felt that it was likely secondary to the      patient's birth control medication.  Patient was currently      therapeutic on her INR and does have some left lower extremity      tenderness with a positive Homan's sign.  Lower extremity Dopplers      were checked and were negative for any new DVT.  The patient was      continued on her Coumadin and  eventually these pulmonary emboli      will continue to resolve.  2. Chest pain.  Patient complains of substernal and right-sided chest      pain with associated shortness of breath and diaphoresis.  No      nausea, vomiting.  The chest pain is pleuritic in nature.  The      patient has a new right lower lobe infarct which likely is      contributory to this pain, is likely secondary to her resolving PE.      Patient was admitted and pain controlled with Percocet and cardiac      enzymes were repeated and were negative.  Electrocardiogram showed      no acute changes and patient will follow up for this resolving pain      in the [facility].  3. Diabetes.  Patient is on 30 units of Lantus at home given every      night but upon presentation sugar was 66, therefore Lantus dose was      held and patient was treated with sliding scale insulin.  Patient's      A1c checked January of 2009 was 10.7 and patient will be restarted      on her Lantus at discharge and will continue followup and have her      diabetes medicines titrated in the outpatient clinic.  4. Asthma.  This was a stable problem.  Patient was continued on her      Advil and albuterol p.r.n.   DISCHARGE LABS AND VITALS:  Temperature 97.6, pulse 80, respiratory rate  20, blood pressure 123/81, sating 99% on room air.  Labs:  TSH normal at  3.14.  Electrolytes:  Sodium 136, potassium 3.8, chloride 104, bicarb  27, BUN 5, creatinine 0.78 and a glucose of 86, calcium of 8.8.  Lipid  profile showed a cholesterol of 150, triglycerides 66, HDL 38, LDL 99.  CBC showed a white count of 8.6, hemoglobin 11.5 and platelets of  347,000.   STUDIES:  1. CT angio which showed resolving bilateral pulmonary embolus with a      new right lower lobe infarct dated January 29, 2008.  2. January 30, 2008, bilateral lower extremity Dopplers negative for      DVT, Baker's cyst or superficial thromboembolus.   CULTURES:  None.    CONSULTS:  None.   DISPOSITION AND FOLLOWUP:  The patient will follow up in the [facility] with Dr. [name].  Patient will keep appointment  as scheduled on February 05, 2008, at 10:30.  Patient will also have  followup in the Coumadin Clinic with Dr. Shela Commons. G__________ March 02, 2008,  at 11:45.      Tacey Ruiz, MD  Electronically Signed     JP/MEDQ  D:  01/30/2008  T:  01/31/2008  Job:  [id number]   cc:   [name], D.O.
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
## 2011-04-28 NOTE — Op Note (Signed)
[facility]  Patient:    Makayla Frazier, Makayla Frazier                      MRN: [id number] Proc. Date: 12/17/00 Attending:  Bing Neighbors. Clearance Coots, M.D.                           Operative Report  PREOPERATIVE DIAGNOSIS:       Menorrhagia unresponsive to medical management.  POSTOPERATIVE DIAGNOSIS:      Menorrhagia unresponsive to medical management.  PROCEDURE:                    Dilation and curettage.  SURGEON:                      Charles A. Clearance Coots, M.D.  ANESTHESIA:                   MAC with paracervical block.  ESTIMATED BLOOD LOSS:         100 ml.  COMPLICATIONS:                None.  SPECIMENS:                    Endometrial curettings.  DESCRIPTION OF PROCEDURE:     The patient was brought to the operating room after satisfactory IV sedation in the supine position.  The legs were brought up in stirrups and the vagina was prepped and draped in the usual sterile fashion.  The urinary bladder was emptied of approximately 25 ml of clear fluid.  Bimanual examination revealed the uterus to be normal size, shape and contour and anteverted.  A sterile speculum was inserted in the vaginal vault and the cervix was isolated.  The anterior lip of the cervix was grasped with a single tooth tenaculum.  A paracervical block of 2% Xylocaine was injected in the lateral fornix at the 3 and 9 oclock positions, 10 ml in each lateral fornix.  The uterus was then sounded and the cervix was dilated up to a #21 Pratt dilator.  A medium sharp curet was then easily introduced into the uterine cavity and a thorough curettage of the endometrial lining was performed without complication.  There was no active bleeding at the conclusion of the procedure.  The specimen was submitted to pathology evaluation.  All instruments were then removed.  The patient tolerated the procedure well and was transported to the recovery room in satisfactory condition. DD:  12/17/00 TD:   12/17/00 Job: [id number]

## 2011-06-11 DIAGNOSIS — B009 Herpesviral infection, unspecified: Secondary | ICD-10-CM

## 2011-06-11 HISTORY — DX: Herpesviral infection, unspecified: B00.9

## 2011-07-06 ENCOUNTER — Encounter: Payer: Medicare Other | Admitting: Internal Medicine

## 2011-07-07 ENCOUNTER — Ambulatory Visit (INDEPENDENT_AMBULATORY_CARE_PROVIDER_SITE_OTHER): Payer: Medicare Other | Admitting: Internal Medicine

## 2011-07-07 ENCOUNTER — Encounter: Payer: Self-pay | Admitting: Internal Medicine

## 2011-07-07 VITALS — BP 130/95 | HR 76 | Temp 98.3°F | Ht 60.0 in | Wt 176.0 lb

## 2011-07-07 DIAGNOSIS — K219 Gastro-esophageal reflux disease without esophagitis: Secondary | ICD-10-CM

## 2011-07-07 DIAGNOSIS — J45909 Unspecified asthma, uncomplicated: Secondary | ICD-10-CM

## 2011-07-07 DIAGNOSIS — D649 Anemia, unspecified: Secondary | ICD-10-CM

## 2011-07-07 DIAGNOSIS — L299 Pruritus, unspecified: Secondary | ICD-10-CM

## 2011-07-07 DIAGNOSIS — M25562 Pain in left knee: Secondary | ICD-10-CM

## 2011-07-07 DIAGNOSIS — E039 Hypothyroidism, unspecified: Secondary | ICD-10-CM

## 2011-07-07 DIAGNOSIS — E119 Type 2 diabetes mellitus without complications: Secondary | ICD-10-CM

## 2011-07-07 DIAGNOSIS — M25569 Pain in unspecified knee: Secondary | ICD-10-CM

## 2011-07-07 LAB — GLUCOSE, CAPILLARY: Glucose-Capillary: 221 mg/dL — ABNORMAL HIGH (ref 70–99)

## 2011-07-07 LAB — POCT GLYCOSYLATED HEMOGLOBIN (HGB A1C): Hemoglobin A1C: 10

## 2011-07-07 MED ORDER — PANTOPRAZOLE SODIUM 40 MG PO TBEC: 40.0000 mg | DELAYED_RELEASE_TABLET | Freq: Every day | ORAL | Status: DC

## 2011-07-07 MED ORDER — METFORMIN HCL ER 500 MG PO TB24: 1000.0000 mg | ORAL_TABLET | Freq: Every day | ORAL | Status: DC

## 2011-07-07 MED ORDER — CETIRIZINE HCL 10 MG PO CHEW: 10.0000 mg | CHEWABLE_TABLET | Freq: Every day | ORAL | Status: DC

## 2011-07-07 NOTE — Assessment & Plan Note (Signed)
C/o cough and wheezing at nighttime during her sleep, accompanied with throat discomfort and heartburn. Patient states that she has to use albuterol inhaler to relieve her wheezing at night time. No similar s/s during the daytime. Pt admits eating late dinner and MN snacks. - will prescribe protonix.

## 2011-07-07 NOTE — Patient Instructions (Addendum)
1. For your Diabetes control    A. Please check your blood sugar 2- 3 times a day before meals.    B. Please bring your meter to the clinic next visit.    C. Will increased your metformin XR to 1000 mg po daily.    D. Please follow up with Makayla Frazier for optimal BG control.    E. Please stop night time snacks as it causes acid reflux at night and high morning blood sugars. 2. Please try to do some water aerobics exercise. 3. New/changed medications.    Protonix 40 mg oral daily.    Zyrtec 10 mg oral daily. 4. I discontinued your Atarax. 5. I will arrange the sports medicine referral to you. 6. Follow up with the clinic in 1 month.

## 2011-07-07 NOTE — Assessment & Plan Note (Signed)
No c/o or acute issue now. H/o intermittent left knee pain x 2 years. Accompanied with mild knee swelling per pt report. Worsening with cold weather and relieved with Ibuprofen. Denies associated fever, joint redness or deformity. Normal Left knee X ray 05/02/2010. I suspect that her left knee pain is due to multi-factors, which includes overweight and lack of muscle strength around the joint. - will arrange the sports medicine referral.
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
## 2011-07-07 NOTE — Assessment & Plan Note (Signed)
Asthma is well controlled per pt report. Pt only uses her Albuterol inhaler when the weather is hot and Humibid.  Pt declines to use Advair inhaler and states that she does not like the taste of the medication. I would discontinue her Advair inhaler for now. Patient declined any addition medical treatment for asthma for now.

## 2011-07-07 NOTE — Assessment & Plan Note (Signed)
Resolved. HH 16.3/48 on 03/15/11. Denies diarrhea or bloody stools.  - will follow up on results of her colonoscopy.

## 2011-07-07 NOTE — Assessment & Plan Note (Signed)
Occasional constipation. Denies fatigue or cold intolerance. Denies dry skin or hair changes. No appetite or weight changes. Denies depressed mood.  Last TSH 4.896 slightly elevated.  -Will recheck TSH and free T4.

## 2011-07-07 NOTE — Assessment & Plan Note (Signed)
Pt states that she has been compliant with Victoza since March of this year, and her BG consistently ranges 400s before breakfast .  Pt forgot to bring her meter today. HBA1C 10 and BG 221 today.  - will arrange pt to see Jamison Neighbor today. -will discuss with pt about treatment plan changes. - encourage pt to check her BG 2-3 times daily.

## 2011-07-10 NOTE — Progress Notes (Signed)
I agree with Dr. Li's assessment and plan. 

## 2011-07-18 ENCOUNTER — Encounter: Payer: Self-pay | Admitting: Internal Medicine

## 2011-07-26 ENCOUNTER — Emergency Department (HOSPITAL_COMMUNITY): Payer: Medicare Other

## 2011-07-26 ENCOUNTER — Emergency Department (HOSPITAL_COMMUNITY)
Admission: EM | Admit: 2011-07-26 | Discharge: 2011-07-26 | Disposition: A | Discharge status: Home / Self Care | Payer: Medicare Other | Attending: Emergency Medicine | Admitting: Emergency Medicine

## 2011-07-26 ENCOUNTER — Encounter: Payer: Self-pay | Admitting: Internal Medicine

## 2011-07-26 DIAGNOSIS — E119 Type 2 diabetes mellitus without complications: Secondary | ICD-10-CM | POA: Insufficient documentation

## 2011-07-26 DIAGNOSIS — Z950 Presence of cardiac pacemaker: Secondary | ICD-10-CM | POA: Insufficient documentation

## 2011-07-26 DIAGNOSIS — Z86711 Personal history of pulmonary embolism: Secondary | ICD-10-CM | POA: Insufficient documentation

## 2011-07-26 DIAGNOSIS — F341 Dysthymic disorder: Secondary | ICD-10-CM | POA: Insufficient documentation

## 2011-07-26 DIAGNOSIS — J45909 Unspecified asthma, uncomplicated: Secondary | ICD-10-CM | POA: Insufficient documentation

## 2011-07-26 DIAGNOSIS — I10 Essential (primary) hypertension: Secondary | ICD-10-CM | POA: Insufficient documentation

## 2011-07-26 DIAGNOSIS — Z79899 Other long term (current) drug therapy: Secondary | ICD-10-CM | POA: Insufficient documentation

## 2011-07-26 DIAGNOSIS — R079 Chest pain, unspecified: Secondary | ICD-10-CM | POA: Insufficient documentation

## 2011-07-26 LAB — POCT I-STAT TROPONIN I: Troponin i, poc: 0 ng/mL (ref 0.00–0.08)

## 2011-07-26 LAB — URINALYSIS, ROUTINE W REFLEX MICROSCOPIC
Bilirubin Urine: NEGATIVE
Glucose, UA: >1000 mg/dL — AB
Hgb urine dipstick: NEGATIVE
Ketones, ur: 15 mg/dL — AB
Leukocytes, UA: NEGATIVE
pH: 6.5 (ref 5.0–8.0)

## 2011-07-26 LAB — POCT I-STAT, CHEM 8
BUN: 9 mg/dL (ref 6–23)
Calcium, Ion: 1.19 mmol/L (ref 1.12–1.32)
Chloride: 101 mEq/L (ref 96–112)
Creatinine, Ser: 0.9 mg/dL (ref 0.50–1.10)
Glucose, Bld: 207 mg/dL — ABNORMAL HIGH (ref 70–99)

## 2011-07-26 LAB — COMPREHENSIVE METABOLIC PANEL
ALT: 22 U/L (ref 0–35)
Albumin: 3.6 g/dL (ref 3.5–5.2)
Alkaline Phosphatase: 71 U/L (ref 39–117)
Chloride: 99 mEq/L (ref 96–112)
Potassium: 4.3 mEq/L (ref 3.5–5.1)
Sodium: 135 mEq/L (ref 135–145)
Total Protein: 8 g/dL (ref 6.0–8.3)

## 2011-07-26 LAB — URINE MICROSCOPIC-ADD ON

## 2011-07-26 LAB — CBC
MCHC: 33.9 g/dL (ref 30.0–36.0)
Platelets: 378 10*3/uL (ref 150–400)
RDW: 14.9 % (ref 11.5–15.5)

## 2011-07-26 NOTE — Progress Notes (Signed)
Patient was evaluated in ED for HTN with BP's in 170's/96; and CP that is most likely of a neuromuscular origin. BP returned to a normotensive range with clonidine 0.1 mg PO x1. Patient was D/C-ed from ED with an instruction to  follow up in [facility] on 07/27/2011 in am. Patient was instructed to call overnight with any concerns.

## 2011-07-27 LAB — URINE CULTURE: Colony Count: 30000

## 2011-07-28 ENCOUNTER — Ambulatory Visit: Payer: Medicare Other | Admitting: Internal Medicine

## 2011-07-28 ENCOUNTER — Encounter: Payer: Self-pay | Admitting: *Deleted

## 2011-07-28 NOTE — Progress Notes (Signed)
Thank you :)

## 2011-07-28 NOTE — Progress Notes (Unsigned)
Pt called to make ED f/u appointment and she is not able to come into clinic today. I have scheduled her for visit on Monday.  Pt denies Chest pain and will go to pharmacy and Check her BP.  Will call if elevated.

## 2011-07-28 NOTE — Progress Notes (Signed)
Pt. Couldn't come in this week. Scheduled for 07/31/11.

## 2011-07-29 ENCOUNTER — Inpatient Hospital Stay (HOSPITAL_COMMUNITY)
Admission: EM | Admit: 2011-07-29 | Discharge: 2011-07-30 | DRG: 313 | Disposition: A | Discharge status: Home / Self Care | Payer: Medicare Other | Attending: Internal Medicine | Admitting: Internal Medicine

## 2011-07-29 ENCOUNTER — Emergency Department (HOSPITAL_COMMUNITY): Payer: Medicare Other

## 2011-07-29 DIAGNOSIS — R0789 Other chest pain: Principal | ICD-10-CM | POA: Diagnosis present

## 2011-07-29 DIAGNOSIS — J45909 Unspecified asthma, uncomplicated: Secondary | ICD-10-CM | POA: Diagnosis present

## 2011-07-29 DIAGNOSIS — Z7982 Long term (current) use of aspirin: Secondary | ICD-10-CM

## 2011-07-29 DIAGNOSIS — E119 Type 2 diabetes mellitus without complications: Secondary | ICD-10-CM | POA: Diagnosis present

## 2011-07-29 DIAGNOSIS — G4733 Obstructive sleep apnea (adult) (pediatric): Secondary | ICD-10-CM | POA: Diagnosis present

## 2011-07-29 DIAGNOSIS — R110 Nausea: Secondary | ICD-10-CM | POA: Diagnosis present

## 2011-07-29 DIAGNOSIS — E669 Obesity, unspecified: Secondary | ICD-10-CM | POA: Diagnosis present

## 2011-07-29 DIAGNOSIS — E785 Hyperlipidemia, unspecified: Secondary | ICD-10-CM | POA: Diagnosis present

## 2011-07-29 DIAGNOSIS — Z79899 Other long term (current) drug therapy: Secondary | ICD-10-CM

## 2011-07-29 DIAGNOSIS — Z95818 Presence of other cardiac implants and grafts: Secondary | ICD-10-CM

## 2011-07-29 DIAGNOSIS — I10 Essential (primary) hypertension: Secondary | ICD-10-CM | POA: Diagnosis present

## 2011-07-29 DIAGNOSIS — Z86711 Personal history of pulmonary embolism: Secondary | ICD-10-CM

## 2011-07-29 DIAGNOSIS — F329 Major depressive disorder, single episode, unspecified: Secondary | ICD-10-CM | POA: Diagnosis present

## 2011-07-29 LAB — POCT I-STAT TROPONIN I: Troponin i, poc: 0 ng/mL (ref 0.00–0.08)

## 2011-07-29 LAB — DIFFERENTIAL
Basophils Relative: 0 % (ref 0–1)
Monocytes Relative: 5 % (ref 3–12)
Neutro Abs: 5.9 10*3/uL (ref 1.7–7.7)
Neutrophils Relative %: 60 % (ref 43–77)

## 2011-07-29 LAB — CBC
Hemoglobin: 13.5 g/dL (ref 12.0–15.0)
MCH: 26.2 pg (ref 26.0–34.0)
RBC: 5.16 MIL/uL — ABNORMAL HIGH (ref 3.87–5.11)

## 2011-07-29 LAB — BASIC METABOLIC PANEL
GFR calc Af Amer: >60 mL/min (ref >60)
GFR calc non Af Amer: >60 mL/min (ref >60)
Potassium: 3.6 mEq/L (ref 3.5–5.1)
Sodium: 136 mEq/L (ref 135–145)

## 2011-07-29 LAB — RAPID URINE DRUG SCREEN, HOSP PERFORMED
Amphetamines: NOT DETECTED
Cocaine: NOT DETECTED
Opiates: NOT DETECTED
Tetrahydrocannabinol: NOT DETECTED

## 2011-07-29 LAB — D-DIMER, QUANTITATIVE: D-Dimer, Quant: 1.14 ug/mL-FEU — ABNORMAL HIGH (ref 0.00–0.48)

## 2011-07-29 LAB — CK TOTAL AND CKMB (NOT AT ARMC)
CK, MB: 1.9 ng/mL (ref 0.3–4.0)
Relative Index: 1.7 (ref 0.0–2.5)
Total CK: 109 U/L (ref 7–177)

## 2011-07-29 MED ORDER — IOHEXOL 300 MG/ML  SOLN
100.0000 mL | Freq: Once | INTRAMUSCULAR | Status: AC | PRN
  Administered 2011-07-29: 100 mL via INTRAVENOUS

## 2011-07-30 ENCOUNTER — Other Ambulatory Visit: Payer: Self-pay | Admitting: Internal Medicine

## 2011-07-30 ENCOUNTER — Encounter: Payer: Self-pay | Admitting: Internal Medicine

## 2011-07-30 DIAGNOSIS — R079 Chest pain, unspecified: Secondary | ICD-10-CM

## 2011-07-30 LAB — TSH: TSH: 2.618 u[IU]/mL (ref 0.350–4.500)

## 2011-07-30 LAB — CARDIAC PANEL(CRET KIN+CKTOT+MB+TROPI): Troponin I: <0.3 ng/mL (ref <0.30)

## 2011-07-30 LAB — GLUCOSE, CAPILLARY

## 2011-07-30 MED ORDER — FLUTICASONE-SALMETEROL 100-50 MCG/DOSE IN AEPB: 1.0000 | INHALATION_SPRAY | Freq: Two times a day (BID) | RESPIRATORY_TRACT | Status: DC

## 2011-07-30 MED ORDER — LISINOPRIL 5 MG PO TABS: 5.0000 mg | ORAL_TABLET | Freq: Every day | ORAL | Status: DC

## 2011-07-30 MED ORDER — INSULIN PEN NEEDLE 31G X 5 MM MISC: Status: DC

## 2011-07-30 NOTE — H&P (Signed)
Hospital Admission Note Date: 07/30/2011  Patient name:  Makayla Frazier   Medical record number:  [id number] Date of birth:  [birth year]  Age: 40 y.o. Gender:  female PCP:    Dierdre Searles, NA, MD, MD  Medical Service:   Internal Medicine Teaching Service   Attending physician:  Dr. Rogelia Boga First Contact:   Dr. Manson Passey Pager: [phone]  Second Contact:   Dr. Scot Dock Pager: [phone] After Hours:    First Contact   Pager: [phone]      Second Contact  Pager: [phone]   Chief Complaint: Chest pain  History of Present Illness: Patient is a 40 y.o. 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 Patient was seen in the ED 3 days prior to admission (Wednesday) for similar complaints; however patient went home instead because of the long wait. At that time patient was found to have elevated blood pressure with a systolic blood pressure in the 170s and was given some medication to decrease her blood pressure which also relieved her chest pain. However the chest pain was recurrent since Thursday.  She described her pain as pressure-like on left upper chest, 8/10 in severity, nagging lasting one to 2 minutes, and intermittent.  She reports sharp pain in left arm and numbness.  She stated that she had a headache prior to the onset of her chest pain. She also endorses some nausea but denies any vomiting or diaphoresis.  Exacerbation factor include movement which made her short of breath and breath "funny" but she contributed this to her asthma.  Alleviation factor include ibuprofen 600mg  and rest.  She denies any fever or or chills, or any neurological deficits or any pleuritic chest pain. Patient denies any similar episodes in the past he reports no history of cardiac problems.  Current  Outpatient Medications:  1. Albuterol 90 mcg/ACT AERS 1 to 2 puffs every 4-6 hours as needed       for wheezing.   2. Victoza 18 mg/3 mL solution, inject 0.9 mg once a day as directed       for blood sugar control.   3. Lisinopril 5 mg tabs, take one tablet by mouth daily.   4. Aspirin 81 mg take 1 tablet daily.  5. Metformin 1000 mg by mouth twice a day  6. Advair Diskus 100/50 mcg dose, take 1 puff twice daily for asthma   Allergies: Darvocet, Vicodin, latex  Past Medical History: Past Medical History  Diagnosis Date  . PE (pulmonary embolism)     2009  and 2000- secondary to oral contraception and long period of immobilization-patient was on Coumadin for one year and then was started on aspirin   . Syncopal episodes     unknown etiology-currently on implanted loop recorder by Dr. Johney Frame   . DM (diabetes mellitus) since 2009 with her last hemoglobin A1c in 07/07/2011: 10.0   . Dysfunctional uterine bleeding status post: Hysteroscopy, dilation and curettage in October 2011      Seen by Dr. Pennie Rushing, GYN  . Asthma   . OSA (obstructive sleep apnea)   . Major depression     Hx suicide attempt in 2009, [facility] admissions  . Syncope and collapse     unknow etiology extensive w/u neuro,cardiology  . Obesity   . Mastodynia  Past Surgical History: Past Surgical History  Procedure Date  . Breast reduction surgery-patient reported that her breast was pulling her chest wall      Dr. Kathie Dike [facility] 2011  . Cholecystectomy   . Cardiac electrophysiology study & dft     Implantable Loop recorder-by Dr. Johney Frame in September 2010     Family History: Family History  Problem Relation Age of Onset  . Melanoma Father 14  . Ulcerative colitis Mother 39  . Breast cancer Paternal Grandmother   . Diabetes type II Maternal Grandmother     deceased 85  . Pulmonary embolism Paternal Grandfather     Social History:         Social History Narrative   Lives in [place] with significant  other, Chrissie Noa.  Patient does not have any children. 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 She denies any alcohol, smoke, illicit drugs     Review of Systems: Pertinent items are noted in HPI.  Vital Signs: T:  98.3 P:  84  BP:  149/99 --> 124/90  RR:  60  O2 sat:  98% on room air   Physical Exam: General: Vital signs reviewed and noted. Well-developed, well-nourished, in no acute distress; alert, appropriate and cooperative throughout examination.  Head: Normocephalic, atraumatic.  Eyes: PERRL, EOMI, No signs of anemia or jaundince.  Nose: Mucous membranes moist, not inflammed, nonerythematous.  Throat: Oropharynx nonerythematous, no exudate appreciated.   Neck: No deformities, masses, or tenderness noted.Supple, No carotid Bruits, no JVD.  Lungs:  Normal respiratory effort. Clear to auscultation BL without crackles or wheezes.  Heart: RRR. S1 and S2 normal without gallop, murmur, or rubs. + Healed scar on left upper chest from implanted loop recorder. There is tenderness to palpation of the left upper chest wall but no erythema, drainage.    Abdomen:  BS normoactive. Soft, Nondistended, non-tender.  No masses or organomegaly.  Extremities: No pretibial edema.  Neurologic: A&O X3, CN II - XII are grossly intact. Motor strength is 5/5 in the all 4 extremities, Sensations intact to light touch, Cerebellar signs negative.  Skin: No visible rashes, scars.   Lab results:  WBC                                      9.8               4.0-10.5         K/uL  RBC                                      5.16       h      3.87-5.11        MIL/uL  Hemoglobin (HGB)                         13.5              12.0-15.0        g/dL  Hematocrit (HCT)                         39.4  36.0-46.0        %  MCV                                      76.4       l      78.0-100.0        fL  MCH -                                    26.2              26.0-34.0        pg  MCHC                                     34.3              30.0-36.0        g/dL  RDW                                      14.5              11.5-15.5        %  Platelet Count (PLT)                     367               150-400          K/uL  Neutrophils, %                           60                43-77            %  Lymphocytes, %                           34                12-46            %  Monocytes, %                             5                 3-12             %  Eosinophils, %                           1                 0-5              %  Basophils, %                             0                 0-1              %  Neutrophils, Absolute                    5.9               1.7-7.7          K/uL  Lymphocytes, Absolute                    3.4               0.7-4.0          K/uL  Monocytes, Absolute                      0.5               0.1-1.0          K/uL  Eosinophils, Absolute                    0.1               0.0-0.7          K/uL  Basophils, Absolute                      0.0               0.0-0.1          K/uL  Sodium (NA)                              136               135-145          mEq/L  Potassium (K)                            3.6               3.5-5.1          mEq/L  Chloride                                 100               96-112           mEq/L  CO2                                      28                19-32            mEq/L  Glucose                                  206        h      70-99            mg/dL  BUN                                      6  6-23             mg/dL  Creatinine                               0.64              0.50-1.10        mg/dL  GFR, Est Non African American            >60               >60              mL/min  GFR, Est African American                >60               >60              mL/min    Oversized comment, see footnote  1   Calcium                                  9.5               8.4-10.5         Mg/dL  D-Dimer, Fibrin Derivatives              1.14       h      0.00-0.48     Beta Natriuretic Peptide                 19.0              0-125            Pg/mL  1st set: Creatine Kinase, Total                   109               7-177            U/L  CK, MB                                   1.9               0.3-4.0          Ng/mL Tro                                             0  2nd set Creatine Kinase, Total                   90                7-177            U/L  CK, MB                                   1.7               0.3-4.0          ng/mL  Relative Index  SEE NOTE.         0.0-2.5    RELATIVE INDEX IS INVALID    WHEN CK < 100 U/L  Troponin I                               <0.30             <0.30            Ng/mL  Urine pregnancy test: neg  UDS: Amphetamins                              SEE NOTE.         NDT    NONE DETECTED  Barbiturates                             SEE NOTE.         NDT    Oversized comment, see footnote  1  Benzodiazepines                          SEE NOTE.         NDT    NONE DETECTED  Cocaine                                  SEE NOTE.         NDT    NONE DETECTED  Opiates                                  SEE NOTE.         NDT    NONE DETECTED  Tetrahydrocannabinol                     SEE NOTE.         NDT    NONE DETECTED  Footnotes  1. NONE DETECTED   Imaging results:  CXR: Findings: Left-sided implantable electrical device again noted.   Heart size is normal.  The lungs are clear. Cholecystectomy clips   noted.  No pleural effusion.  No acute osseous finding.    IMPRESSION:   No acute cardiopulmonary process.  CT Angiography:    No central focal filling defect to suggest acute pulmonary   embolism.  No etiology to explain the history chest pain.  Assessment & Plan: 1. chest pain: Likely musculoskeletal given that patient has tenderness  to palpation of the left chest wall. However other differential diagnoses include PE, ACS, aortic dissection, pneumothorax, cardiac tamponade, infection such as pneumonia. Patient does have a history of recurrent PEs x2 which were provoked, she did have an elevated d-dimer of 1.14 however CT angiograph  was negative for pulmonary embolus. Her Wells score was 1 and modified geneva score was 3 which is low probability.  Aortic dissection, pneumothorax, cardiac tamponade, pneumonia are all unlikely because  no evidence on chest x-ray.  Cocaine-induced vasospasm is also possible however patient denies any illicit drugs use and that her UDS is negative.   ACS need to be ruled out because of her recurrent chest pain; given her risk factors of diabetes and, hypertension.  EKG today showed no acute changes from  07/26/2011. There is no ST elevation or depression, no sign of acute ischemia. There are nonspecific T waves /inversion on V2-V5. Cardiac enzymes x2 sets have been negative thus far. -Admit patient to telemetry -Will continue to cycle cardiac enzymes for a total of 3 sets -Will repeat EKG in the morning -Will get 2-D echo since her last 2-D echo was done on December 2009 showed an EFof 55%. - Give nitroglycerin 0.4 mg sublingual when necessary and morphine IV 2.5 mg when necessary -Continue aspirin 81 mg by mouth q.daily -Oxygen supplementation if needed  2. diabetes type 2- not well controlled. Hemoglobin A1c in 07/07/2011 was 10.0.  Outpatient medication include Victoza and metformin. -Will hold Victoza and metformin during hospitalization -Start sliding scale insulin sensitive with CBGs q. a.c. and at bedtime  3. Asthma-stable. No wheezing on physical examination.  Will continue albuterol inhaler 2 puffs every 4 hours when necessary and Advair discus 1 puff twice a day  4. Hypertension-well controlled. Will continue home medications lisinopril 5 mg by mouth daily   DVT PPX -Lovenox 40 mg  subcutaneous daily     Carrolyn Meiers, M.D. (PGY2):  ____________________________________    Date/ Time:    ____________________________________     Lorretta Harp, M.D. (PGY1):   ____________________________________    Date/ Time:    ____________________________________     I have seen and examined the patient. I reviewed the resident/fellow note and agree with the findings and plan of care as documented. My additions and revisions are included.   Signature:  ____________________________________________     Internal Medicine Teaching Service Attending    Date:    ____________________________________________

## 2011-07-30 NOTE — Discharge Summary (Signed)
Admitted for chest pain. ACS and other life threatening causes ruled out. Thought to be MSK related.   To Do - patient asked to hold metformin for 2 days and restart after that as she had received constrast here. Ensure this has happened. - chest pain resolution - sleep study

## 2011-07-31 ENCOUNTER — Ambulatory Visit: Payer: Medicare Other | Admitting: Internal Medicine

## 2011-07-31 LAB — GLUCOSE, CAPILLARY: Glucose-Capillary: 186 mg/dL — ABNORMAL HIGH (ref 70–99)

## 2011-08-08 ENCOUNTER — Ambulatory Visit (INDEPENDENT_AMBULATORY_CARE_PROVIDER_SITE_OTHER): Payer: Medicare Other | Admitting: Internal Medicine

## 2011-08-08 ENCOUNTER — Encounter: Payer: Self-pay | Admitting: Internal Medicine

## 2011-08-08 ENCOUNTER — Encounter: Payer: Self-pay | Admitting: *Deleted

## 2011-08-08 VITALS — BP 138/92 | HR 77 | Temp 97.6°F | Ht 60.0 in | Wt 176.1 lb

## 2011-08-08 DIAGNOSIS — I10 Essential (primary) hypertension: Secondary | ICD-10-CM

## 2011-08-08 DIAGNOSIS — E119 Type 2 diabetes mellitus without complications: Secondary | ICD-10-CM

## 2011-08-08 LAB — GLUCOSE, CAPILLARY: Glucose-Capillary: 175 mg/dL — ABNORMAL HIGH (ref 70–99)

## 2011-08-08 MED ORDER — METFORMIN HCL ER 500 MG PO TB24: 1000.0000 mg | ORAL_TABLET | Freq: Two times a day (BID) | ORAL | Status: DC

## 2011-08-08 NOTE — Assessment & Plan Note (Signed)
Based on A1c uncontrolled, most likely secondary to medical noncompliance. This was discussed extensively on previous visit and we have also discussed compliance on this visit. Patient appears to be compliant with medications as well as recommended diet and exercise. I have advised her to continue taking her medications and to check her blood sugars regularly and to bring the monitor back to her next appointment so we can review it and readjust medication regimen if indicated. I have examined her feet and the results are within normal limits.

## 2011-08-08 NOTE — Progress Notes (Signed)
  Subjective:    Patient ID: Makayla Frazier, female    DOB: 1971/09/21, 40 y.o.   MRN: [id number]  HPI  Patient is 40 year old female with past medical history outlined below who presents to clinic for regular followup on blood pressure, diabetes, cholesterol. She reports compliance with medications as well as recommended diet and exercise. She denies chest pain, shortness of breath, fevers or chills, denies abdominal or urinary concerns.  Review of Systems Constitutional: Denies fever, chills, diaphoresis, appetite change and fatigue.  HEENT: Denies photophobia, eye pain, redness, hearing loss, ear pain, congestion, sore throat, rhinorrhea, sneezing, mouth sores, trouble swallowing, neck pain, neck stiffness and tinnitus.   Respiratory: Denies SOB, DOE, cough, chest tightness,  and wheezing.   Cardiovascular: Denies chest pain, palpitations and leg swelling.  Gastrointestinal: Denies nausea, vomiting, abdominal pain, diarrhea, constipation, blood in stool and abdominal distention.  Genitourinary: Denies dysuria, urgency, frequency, hematuria, flank pain and difficulty urinating.  Musculoskeletal: Denies myalgias, back pain, joint swelling, arthralgias and gait problem.  Skin: Denies pallor, rash and wound.  Neurological: Denies dizziness, seizures, syncope, weakness, light-headedness, numbness and headaches.  Hematological: Denies adenopathy. Easy bruising, personal or family bleeding history  Psychiatric/Behavioral: Denies suicidal ideation, mood changes, confusion, nervousness, sleep disturbance and agitation      Objective:   Physical Exam  Constitutional: Vital signs reviewed.  Patient is a well-developed and well-nourished in no acute distress and cooperative with exam. Alert and oriented x3.  Cardiovascular: RRR, S1 normal, S2 normal, no MRG, pulses symmetric and intact bilaterally Pulmonary/Chest: CTAB, no wheezes, rales, or rhonchi Abdominal: Soft. Non-tender, non-distended,  bowel sounds are normal, no masses, organomegaly, or guarding present.          Assessment & Plan:

## 2011-08-08 NOTE — Assessment & Plan Note (Signed)
Well-controlled on current medication regimen. Electrolytes were checked last week and were within normal limits. We will continue the same regimen for now. I have advised patient to check her blood pressure regularly and to call his back and the numbers are higher than 140/90.

## 2011-08-16 NOTE — Discharge Summary (Signed)
Makayla, Frazier NO.:  [id number]  MEDICAL RECORD NO.:  [id number]  LOCATION:  [facility]                         FACILITY:  [facility]  PHYSICIAN:  Blanch Media, M.D.DATE OF BIRTH:  03-02-1971  DATE OF ADMISSION:  07/29/2011 DATE OF DISCHARGE:  07/30/2011                              DISCHARGE SUMMARY   PRIMARY CARE PHYSICIAN:  [facility].  DISCHARGE DIAGNOSES: 1. Musculoskeletal chest pain.  Acute coronary syndrome ruled out. 2. History of pulmonary embolism in 2009 and in 2000 secondary to oral     contraception and long period of immobilization.  The patient was     on Coumadin for 1 year and then was started on aspirin. 3. History of syncopal episodes of unknown etiology.  Has an implanted     loop recorder by Dr. Johney Frame. 4. Diabetes mellitus since 2009 with an HbA1c of 10 as of July 2012.     The patient is on Victoza and metformin. 5. Dysfunctional uterine bleeding status post hysteroscopy, dilatation     and curettage in 2011.  Followed by Dr. Pennie Rushing with OB/GYN. 6. Asthma. 7. Obstructive sleep apnea. 8. Major depression with a history of suicidal attempt in 2009 and     multiple [facility] admissions. 9. Obesity. 10.Breast reduction surgery at [facility] in 2011. 11.History of cholecystectomy.  DISCHARGE MEDICATIONS: 1. Tylenol 325 mg 1-2 tablets every 4 hours as needed for pain. 2. Advair 1 puff twice a day. 3. Albuterol 2 puffs every 4 hours as needed for shortness of breath. 4. Aspirin 81 mg 1 tablet daily. 5. Victoza 0.6 mg subcutaneous daily at bedtime. 6. Lisinopril 5 mg 1 tablet daily at bedtime. 7. Metformin 1000 mg daily at bedtime.  The patient is to stop taking     for 2 days after discharge because she was given contrast during     this hospitalization.  She can restart it after that.  DISPOSITION AND FOLLOWUP:  The patient will be followed up in [facility] in 1-2 weeks after discharge.   At that time, following issues needs to be checked upon: 1. Resolution of her chest pain. 2. Restarting metformin.  This was held post discharge because of     contrast given during the hospitalization. 3. Sleep study as outpatient.  ADMITTING HISTORY AND PHYSICAL:  A 40 year old woman with past medical history dictated above came to the emergency department for chest pain. She was seen in the ED 3 days prior to this admission for similar complaints.  However, she went home instead because of long wait.  At that time, the patient was found to have elevated blood pressure with systolics in 170s and was given some medication to decrease her blood pressure which also relieved her chest pain.  However, the chest pain was recurrent since 2 days prior to this admission.  She described her pain as pressure like on the left upper chest, 8-10 in severity, nagging, lasting 1-2 minutes, and intermittent.  She reports sharp pain in left arm and numbness.  She stated that she had a headache prior to the onset of chest pain.  She also endorses some nausea but denies any vomiting  or diaphoresis.  Exacerbating factors include movement which made her short of breath and breath funny, but she contributed it to her asthma.  Alleviating factors include ibuprofen and rest.  She denied any fevers or chills or any neurological deficits.  PHYSICAL EXAMINATION AT ADMISSION:  VITAL SIGNS:  Temperature 98.3, pulse 84, blood pressure 124/90, respirations 16, and oxygen saturation 98% on room air. GENERAL:  No acute distress. HEENT:  Head is normocephalic and atraumatic.  Eyes; pupils equally reactive to light.  Extraocular muscles intact.  Nose; mucous membrane moist and pink.  Throat oropharynx clear. NECK:  Supple.  No JVD. LUNGS:  Clear to auscultation bilaterally. HEART:  Regular rate and rhythm.  No murmurs. ABDOMEN:  Soft, nontender, and nondistended. EXTREMITIES:  No edema. NEUROLOGIC:   Nonfocal.  PROCEDURES PERFORMED:  CT angio of the chest which was negative for any pulmonary embolism or any other acute cause of her chest pain.  CONSULTATIONS:  None.  HOSPITAL COURSE: 1. Chest pain.  The patient was ruled out of ACS and pulmonary     embolism as the cause of her chest pain.  She was continued on her     home medication.  She was given Tylenol and ibuprofen for her chest     pain which helped her chest pain.  From physical exam and history,     this chest pain looked more like musculoskeletal than cardiac or     pulmonary cause.  She did not have any further chest pain at the     time of discharge and her vitals and labs were stable.  The patient     is being discharged with her previous home medications.  The     patient is currently being discharged without any further workup     for this chest pain.  If she continues to have chest pain, she may     need outpatient cardiology workup.  This will be followed up in the     [facility]. 2. Diabetes.  The patient's metformin was held at the time of     discharge.  She is asked to resume it 2 days after discharge.  This     is because she had received contrast in the hospital and we are     trying to avoid contrast-induced nephropathy. 3. All other medical problems are stable and no changes have been made     during this hospitalization.  DISCHARGE VITAL SIGNS:  Temperature 98.4, pulse 77, respirations 18, blood pressure 119/81, and oxygen saturation 97% on room air.  DISCHARGE LABORATORY DATA:  TSH 2.618.  Cardiac panel negative x3. Urine drug screen negative for cocaine, benzos, marijuana, and opiates. BMET shows a sodium of 136, potassium of 3.6, chloride 100, bicarb 28, glucose 206, BUN 6, creatinine 0.64, and calcium 9.5.  White count 9.8, hemoglobin 13.5, and platelet count 367.     Bethel Born, MD   ______________________________ Blanch Media, M.D.    MD/MEDQ  D:  07/30/2011   T:  07/30/2011  Job:  [id number]  Electronically Signed by Bethel Born  on 08/10/2011 04:54:09 PM Electronically Signed by Blanch Media M.D. on 08/16/2011 08:37:11 AM

## 2011-08-21 ENCOUNTER — Ambulatory Visit (INDEPENDENT_AMBULATORY_CARE_PROVIDER_SITE_OTHER): Payer: Medicare Other | Admitting: Sports Medicine

## 2011-08-21 VITALS — BP 164/124 | Ht 60.0 in | Wt 174.0 lb

## 2011-08-21 DIAGNOSIS — M25562 Pain in left knee: Secondary | ICD-10-CM

## 2011-08-21 DIAGNOSIS — M25569 Pain in unspecified knee: Secondary | ICD-10-CM

## 2011-08-21 DIAGNOSIS — M7650 Patellar tendinitis, unspecified knee: Secondary | ICD-10-CM

## 2011-08-21 DIAGNOSIS — M7652 Patellar tendinitis, left knee: Secondary | ICD-10-CM | POA: Insufficient documentation

## 2011-08-21 MED ORDER — NITROGLYCERIN 0.2 MG/HR TD PT24: MEDICATED_PATCH | TRANSDERMAL | Status: DC

## 2011-08-21 NOTE — Assessment & Plan Note (Signed)
This patient has significant tendinopathy and spurring of the proximal patellar tendon as demonstrated on muscular skeletal ultrasound  Use of a nitroglycerin protocol to promote healing is our best chance of resolving the tendon defect.  Use patellar strap Basic quad isometric exercises and straight leg lifts We cautioned her about side effects with nitroglycerin and that these generally resolve after 2 weeks  Return to clinic in 4-6 weeks

## 2011-08-21 NOTE — Progress Notes (Signed)
  Subjective:    Patient ID: Makayla Frazier, female    DOB: 07/02/1971, 40 y.o.   MRN: [id number]  HPI 40 yo female presents with left knee pain. The pain may have started in 2009 but has been worsening over the past two months. She feels clicking and popping of her patella.  There is pain with walking.  There is occasionally swelling after the episodes of clicking, but this is currently not present.    Review of Systems Hx of DVT, PE, with a possible fall on the knee in 2009, DM, OSA     Objective:   Physical Exam There is full ROM of the left knee. There is crepitus of the patella with extension and a positive compression test.  There is tenderness at the patella tendon and lateral border of the patella.  The ligaments are intact with a negative lachmans, and intact varus and valgus stress. McMurrays is negative.  There is a weak VMO with pain of contraction of the quadriceps tendon.  MSK ultrasound Significant spurring off the inferior margin of the patella laterally Tendinopathy with hypoechoic change representing tendinous degeneration is noted best on transverse scan There is thickening of the lateral proximal patellar tendon Abnormal Doppler flow and neo-vessels are seen in the proximal tendon Distal tendon is normal       Assessment & Plan:  MSK u/s There spurring of the patellar tendon with deficit in the patella tendon.  1/4 0.2 NTG patch every 24 hours Home exercises demonstrated for quad sets and SLR. Patellar tendon strap given; pt instructed to wear with walking and activity only due to hx of DVT/PE

## 2011-08-30 ENCOUNTER — Other Ambulatory Visit: Payer: Self-pay

## 2011-08-30 ENCOUNTER — Encounter: Payer: Self-pay | Admitting: Internal Medicine

## 2011-08-30 ENCOUNTER — Ambulatory Visit (INDEPENDENT_AMBULATORY_CARE_PROVIDER_SITE_OTHER): Payer: Medicare Other | Admitting: *Deleted

## 2011-08-30 DIAGNOSIS — R55 Syncope and collapse: Secondary | ICD-10-CM

## 2011-08-30 NOTE — Progress Notes (Signed)
ILR check 

## 2011-08-31 LAB — COMPREHENSIVE METABOLIC PANEL
ALT: 19
ALT: 46 — ABNORMAL HIGH
ALT: 48 — ABNORMAL HIGH
AST: 25
Albumin: 2.6 — ABNORMAL LOW
Albumin: 3.6
Alkaline Phosphatase: 48
CO2: 25
Calcium: 9.1
Calcium: 9.4
Chloride: 102
GFR calc Af Amer: >60
GFR calc Af Amer: >60
GFR calc non Af Amer: >60
Glucose, Bld: 93
Potassium: 3.7
Sodium: 136
Sodium: 137
Sodium: 137
Total Bilirubin: 0.4
Total Protein: 6.8
Total Protein: 8.5 — ABNORMAL HIGH

## 2011-08-31 LAB — CBC
HCT: 32.9 — ABNORMAL LOW
HCT: 34 — ABNORMAL LOW
HCT: 35.9 — ABNORMAL LOW
HCT: 36.5
HCT: 41
Hemoglobin: 10.7 — ABNORMAL LOW
Hemoglobin: 10.8 — ABNORMAL LOW
Hemoglobin: 11.8 — ABNORMAL LOW
Hemoglobin: 11.8 — ABNORMAL LOW
Hemoglobin: 12.1
Hemoglobin: 13.3
MCHC: 32.4
MCHC: 32.7
MCV: 77.8 — ABNORMAL LOW
MCV: 77.8 — ABNORMAL LOW
MCV: 78.2
MCV: 78.3
Platelets: 319
Platelets: 423 — ABNORMAL HIGH
RBC: 4.23
RBC: 4.73
RBC: 4.74
RBC: 4.77
RBC: 5.24 — ABNORMAL HIGH
RDW: 15.4
RDW: 15.5
WBC: 10.1
WBC: 10.4
WBC: 11 — ABNORMAL HIGH
WBC: 11.2 — ABNORMAL HIGH
WBC: 8.9
WBC: 9.2
WBC: 9.3

## 2011-08-31 LAB — HEMOGLOBINOPATHY EVALUATION
Hemoglobin Other: 0 (ref 0.0–0.0)
Hgb A: 97.6 %
Hgb F Quant: 0 (ref 0.0–2.0)
Hgb S Quant: 0 % (ref 0.0–0.0)

## 2011-08-31 LAB — CREATININE, URINE, RANDOM: Creatinine, Urine: 215.9

## 2011-08-31 LAB — DIFFERENTIAL
Eosinophils Absolute: 0.1
Eosinophils Absolute: 0.1
Lymphs Abs: 2.1
Lymphs Abs: 2.8
Monocytes Absolute: 0.3
Monocytes Absolute: 0.4
Monocytes Relative: 3
Monocytes Relative: 4
Neutro Abs: 6.7
Neutrophils Relative %: 69
Neutrophils Relative %: 73

## 2011-08-31 LAB — URINE MICROSCOPIC-ADD ON

## 2011-08-31 LAB — BASIC METABOLIC PANEL
BUN: 5 — ABNORMAL LOW
BUN: 5 — ABNORMAL LOW
CO2: 26
CO2: 27
CO2: 29
Calcium: 8.5
Calcium: 9
Calcium: 9.1
Chloride: 100
Chloride: 102
Creatinine, Ser: 0.81
GFR calc Af Amer: >60
GFR calc Af Amer: >60
GFR calc non Af Amer: >60
GFR calc non Af Amer: >60
Glucose, Bld: 109 — ABNORMAL HIGH
Glucose, Bld: 204 — ABNORMAL HIGH
Potassium: 3.3 — ABNORMAL LOW
Potassium: 3.5
Potassium: 3.9
Sodium: 138
Sodium: 138
Sodium: 138
Sodium: 138

## 2011-08-31 LAB — I-STAT 8, (EC8 V) (CONVERTED LAB)
Acid-base deficit: 1
Chloride: 103
Glucose, Bld: 297 — ABNORMAL HIGH
Hemoglobin: 16 — ABNORMAL HIGH
Potassium: 3.4 — ABNORMAL LOW
Sodium: 137
TCO2: 27

## 2011-08-31 LAB — PROTEIN C, TOTAL: Protein C, Total: 92 % (ref 70–140)

## 2011-08-31 LAB — LUPUS ANTICOAGULANT PANEL
DRVVT: 63.5 — ABNORMAL HIGH (ref 36.1–47.0)
DRVVT: 63.9 — ABNORMAL HIGH (ref 36.1–47.0)
Lupus Anticoagulant: NOT DETECTED
Lupus Anticoagulant: NOT DETECTED
PTTLA 4:1 Mix: 67.8 — ABNORMAL HIGH (ref 36.3–48.8)
dRVVT Incubated 1:1 Mix: 42.5 (ref 36.1–47.0)
dRVVT Incubated 1:1 Mix: 43.3 (ref 36.1–47.0)

## 2011-08-31 LAB — ANTITHROMBIN III: AntiThromb III Func: 106 (ref 76–126)

## 2011-08-31 LAB — PROTIME-INR
INR: 1.2
INR: 2.6 — ABNORMAL HIGH
INR: 2.9 — ABNORMAL HIGH
INR: 3.2 — ABNORMAL HIGH
Prothrombin Time: 12.8
Prothrombin Time: 28.1 — ABNORMAL HIGH
Prothrombin Time: 29 — ABNORMAL HIGH
Prothrombin Time: 31.4 — ABNORMAL HIGH

## 2011-08-31 LAB — URINALYSIS, ROUTINE W REFLEX MICROSCOPIC
Glucose, UA: >1000 — AB
Glucose, UA: NEGATIVE
Hgb urine dipstick: NEGATIVE
Hgb urine dipstick: NEGATIVE
Ketones, ur: >80 — AB
Specific Gravity, Urine: 1.012
Specific Gravity, Urine: >1.046 — ABNORMAL HIGH
pH: 5.5
pH: 6

## 2011-08-31 LAB — RAPID URINE DRUG SCREEN, HOSP PERFORMED
Barbiturates: NOT DETECTED
Benzodiazepines: NOT DETECTED

## 2011-08-31 LAB — IRON AND TIBC
Iron: 13 — ABNORMAL LOW
Saturation Ratios: 4 — ABNORMAL LOW
TIBC: 292
UIBC: 279

## 2011-08-31 LAB — APTT: aPTT: 25

## 2011-08-31 LAB — VITAMIN B12: Vitamin B-12: 1643 — ABNORMAL HIGH (ref 211–911)

## 2011-08-31 LAB — TECHNOLOGIST SMEAR REVIEW

## 2011-08-31 LAB — BETA-2-GLYCOPROTEIN I ABS, IGG/M/A
Beta-2 Glyco I IgG: 6 U/mL (ref <20)
Beta-2-Glycoprotein I IgA: 9 U/mL (ref <10)
Beta-2-Glycoprotein I IgM: 4 U/mL (ref <10)

## 2011-08-31 LAB — RETICULOCYTES: Retic Count, Absolute: 51.8

## 2011-08-31 LAB — CARDIAC PANEL(CRET KIN+CKTOT+MB+TROPI)
CK, MB: 0.5
Relative Index: INVALID
Total CK: 72
Total CK: 75
Troponin I: 0.02

## 2011-08-31 LAB — PROTEIN, URINE, RANDOM: Total Protein, Urine: <6

## 2011-08-31 LAB — URINE CULTURE: Colony Count: NO GROWTH

## 2011-08-31 LAB — CARDIOLIPIN ANTIBODIES, IGG, IGM, IGA
Anticardiolipin IgA: 14 (ref <13)
Anticardiolipin IgM: 7 — ABNORMAL LOW (ref <10)

## 2011-08-31 LAB — MICROALBUMIN, URINE: Microalb, Ur: 2.51 — ABNORMAL HIGH

## 2011-08-31 LAB — POCT I-STAT CREATININE: Operator id: 198171

## 2011-08-31 LAB — LACTATE DEHYDROGENASE: LDH: 150

## 2011-08-31 LAB — PROTHROMBIN GENE MUTATION

## 2011-08-31 LAB — PROTEIN S, TOTAL: Protein S Ag, Total: 134 % (ref 70–140)

## 2011-08-31 LAB — TROPONIN I: Troponin I: 0.01

## 2011-08-31 LAB — CK TOTAL AND CKMB (NOT AT ARMC)
CK, MB: 0.8
Total CK: 102

## 2011-09-01 LAB — CBC
HCT: 34.4 — ABNORMAL LOW
HCT: 35.9 — ABNORMAL LOW
Hemoglobin: 11.2 — ABNORMAL LOW
Hemoglobin: 11.5 — ABNORMAL LOW
MCHC: 32.2
MCHC: 32.5
MCV: 76.8 — ABNORMAL LOW
Platelets: 347
Platelets: 386
RBC: 4.66
RDW: 15.6 — ABNORMAL HIGH
RDW: 15.9 — ABNORMAL HIGH
RDW: 16 — ABNORMAL HIGH
WBC: 8.6
WBC: 9.7

## 2011-09-01 LAB — BASIC METABOLIC PANEL
BUN: 5 — ABNORMAL LOW
CO2: 28
Chloride: 104
Glucose, Bld: 104 — ABNORMAL HIGH
Glucose, Bld: 86
Potassium: 3.7
Potassium: 3.8
Sodium: 136
Sodium: 140

## 2011-09-01 LAB — COMPREHENSIVE METABOLIC PANEL
BUN: 7
CO2: 25
Calcium: 9
Creatinine, Ser: 0.77
GFR calc non Af Amer: >60
Glucose, Bld: 118 — ABNORMAL HIGH
Total Bilirubin: 0.4

## 2011-09-01 LAB — I-STAT 8, (EC8 V) (CONVERTED LAB)
Glucose, Bld: 66 — ABNORMAL LOW
HCT: 49 — ABNORMAL HIGH
Hemoglobin: 16.7 — ABNORMAL HIGH
Potassium: 4.7
Sodium: 139
pCO2, Ven: 36.7 — ABNORMAL LOW

## 2011-09-01 LAB — DIFFERENTIAL
Basophils Absolute: 0.1
Lymphocytes Relative: 34
Lymphs Abs: 3.3
Neutrophils Relative %: 60

## 2011-09-01 LAB — POCT CARDIAC MARKERS
Myoglobin, poc: 100
Operator id: 146091

## 2011-09-01 LAB — POCT I-STAT CREATININE
Creatinine, Ser: 0.8
Operator id: 264631

## 2011-09-01 LAB — CARDIAC PANEL(CRET KIN+CKTOT+MB+TROPI)
CK, MB: 0.6
CK, MB: 0.7
Total CK: 75
Total CK: 85
Troponin I: 0.02

## 2011-09-01 LAB — LIPID PANEL
Cholesterol: 150
HDL: 38 — ABNORMAL LOW
Total CHOL/HDL Ratio: 3.9
VLDL: 13

## 2011-09-01 LAB — HEPATITIS PANEL, ACUTE
HCV Ab: NEGATIVE
Hep A IgM: NEGATIVE
Hepatitis B Surface Ag: NEGATIVE

## 2011-09-01 LAB — CK TOTAL AND CKMB (NOT AT ARMC): Relative Index: INVALID

## 2011-09-01 LAB — APTT: aPTT: 36

## 2011-09-01 LAB — RAPID URINE DRUG SCREEN, HOSP PERFORMED
Barbiturates: NOT DETECTED
Opiates: POSITIVE — AB
Tetrahydrocannabinol: NOT DETECTED

## 2011-09-01 LAB — TROPONIN I: Troponin I: 0.02

## 2011-09-01 LAB — TSH: TSH: 3.148

## 2011-09-04 LAB — I-STAT 8, (EC8 V) (CONVERTED LAB)
Bicarbonate: 25.8 — ABNORMAL HIGH
HCT: 43
Potassium: 4.5
Sodium: 139
TCO2: 27
pH, Ven: 7.372 — ABNORMAL HIGH

## 2011-09-04 LAB — POCT I-STAT CREATININE
Creatinine, Ser: 0.9
Operator id: 294521

## 2011-09-04 LAB — PROTIME-INR: Prothrombin Time: 14

## 2011-09-05 ENCOUNTER — Encounter: Payer: Self-pay | Admitting: *Deleted

## 2011-09-06 LAB — DIFFERENTIAL
Basophils Absolute: 0.1
Basophils Relative: 1
Eosinophils Absolute: 0.1
Lymphs Abs: 3.3
Neutrophils Relative %: 53

## 2011-09-06 LAB — POCT I-STAT, CHEM 8
Calcium, Ion: 1.21
Creatinine, Ser: 0.9
Glucose, Bld: 81
HCT: 40
Hemoglobin: 13.6
TCO2: 28

## 2011-09-06 LAB — PROTIME-INR
INR: 2.7 — ABNORMAL HIGH
Prothrombin Time: 30 — ABNORMAL HIGH

## 2011-09-06 LAB — CBC
MCHC: 32.2
MCV: 75.3 — ABNORMAL LOW
Platelets: 378
RDW: 17.5 — ABNORMAL HIGH
WBC: 9

## 2011-09-07 LAB — PREGNANCY, URINE: Preg Test, Ur: NEGATIVE

## 2011-09-07 LAB — URINALYSIS, ROUTINE W REFLEX MICROSCOPIC
Bilirubin Urine: NEGATIVE
Ketones, ur: 40 — AB
Nitrite: POSITIVE — AB
Urobilinogen, UA: 1

## 2011-09-07 LAB — URINE CULTURE: Colony Count: >=100000

## 2011-09-07 LAB — URINE MICROSCOPIC-ADD ON

## 2011-09-08 LAB — GLUCOSE, CAPILLARY
Glucose-Capillary: 73
Glucose-Capillary: 99
Glucose-Capillary: 69 — ABNORMAL LOW
Glucose-Capillary: 73
Glucose-Capillary: 99

## 2011-09-08 LAB — CBC
HCT: 38.6
Hemoglobin: 12
Hemoglobin: 12.4
MCHC: 32.1
MCV: 76.8 — ABNORMAL LOW
RBC: 4.86
RBC: 4.94
RDW: 18.1 — ABNORMAL HIGH
RDW: 18.1 — ABNORMAL HIGH
WBC: 7.9

## 2011-09-08 LAB — RAPID URINE DRUG SCREEN, HOSP PERFORMED
Benzodiazepines: NOT DETECTED
Cocaine: NOT DETECTED
Tetrahydrocannabinol: NOT DETECTED

## 2011-09-08 LAB — DIFFERENTIAL
Basophils Absolute: 0
Basophils Absolute: 0.1
Basophils Relative: 1
Basophils Relative: 1
Eosinophils Absolute: 0.1
Eosinophils Absolute: 0.1
Monocytes Absolute: 0.5
Monocytes Relative: 6
Monocytes Relative: 6
Neutro Abs: 4.4
Neutro Abs: 5.1
Neutrophils Relative %: 60
Neutrophils Relative %: 65

## 2011-09-08 LAB — COMPREHENSIVE METABOLIC PANEL
ALT: 37 — ABNORMAL HIGH
Alkaline Phosphatase: 56
BUN: 6
CO2: 27
Chloride: 107
GFR calc non Af Amer: >60
Glucose, Bld: 114 — ABNORMAL HIGH
Potassium: 3.9
Sodium: 140
Total Bilirubin: 0.6
Total Protein: 6.9

## 2011-09-08 LAB — URINALYSIS, ROUTINE W REFLEX MICROSCOPIC
Bilirubin Urine: NEGATIVE
Glucose, UA: NEGATIVE
Hgb urine dipstick: NEGATIVE
Specific Gravity, Urine: 1.004 — ABNORMAL LOW
Urobilinogen, UA: 0.2

## 2011-09-08 LAB — ETHANOL: Alcohol, Ethyl (B): <5

## 2011-09-08 LAB — POCT I-STAT, CHEM 8
BUN: 7
Calcium, Ion: 1.24
Chloride: 104
Glucose, Bld: 106 — ABNORMAL HIGH
TCO2: 29

## 2011-09-08 LAB — ACETAMINOPHEN LEVEL: Acetaminophen (Tylenol), Serum: <10 — ABNORMAL LOW

## 2011-09-08 LAB — PROTIME-INR
INR: 1.2
INR: 1.6 — ABNORMAL HIGH
INR: 3.3 — ABNORMAL HIGH

## 2011-09-08 LAB — POCT CARDIAC MARKERS
Operator id: 146091
Troponin i, poc: <0.05

## 2011-09-08 LAB — SALICYLATE LEVEL: Salicylate Lvl: <4

## 2011-09-08 LAB — PREGNANCY, URINE: Preg Test, Ur: NEGATIVE

## 2011-09-10 ENCOUNTER — Emergency Department (HOSPITAL_COMMUNITY)
Admission: EM | Admit: 2011-09-10 | Discharge: 2011-09-10 | Disposition: A | Discharge status: Home / Self Care | Payer: Medicare Other | Attending: Emergency Medicine | Admitting: Emergency Medicine

## 2011-09-10 DIAGNOSIS — Z7982 Long term (current) use of aspirin: Secondary | ICD-10-CM | POA: Insufficient documentation

## 2011-09-10 DIAGNOSIS — R112 Nausea with vomiting, unspecified: Secondary | ICD-10-CM | POA: Insufficient documentation

## 2011-09-10 DIAGNOSIS — R51 Headache: Secondary | ICD-10-CM | POA: Insufficient documentation

## 2011-09-10 DIAGNOSIS — E119 Type 2 diabetes mellitus without complications: Secondary | ICD-10-CM | POA: Insufficient documentation

## 2011-09-10 DIAGNOSIS — Z86718 Personal history of other venous thrombosis and embolism: Secondary | ICD-10-CM | POA: Insufficient documentation

## 2011-09-10 DIAGNOSIS — Z79899 Other long term (current) drug therapy: Secondary | ICD-10-CM | POA: Insufficient documentation

## 2011-09-10 LAB — GLUCOSE, CAPILLARY

## 2011-09-11 LAB — DIFFERENTIAL
Lymphocytes Relative: 44
Lymphs Abs: 3.6
Neutro Abs: 3.8
Neutrophils Relative %: 46

## 2011-09-11 LAB — CARDIAC PANEL(CRET KIN+CKTOT+MB+TROPI)
CK, MB: 0.9
Relative Index: 0.6
Troponin I: 0.01

## 2011-09-11 LAB — PROTIME-INR
INR: 4.3 — ABNORMAL HIGH
INR: 1.1
Prothrombin Time: 44.8 — ABNORMAL HIGH
Prothrombin Time: 51.5 — ABNORMAL HIGH

## 2011-09-11 LAB — CBC
Platelets: 346
RBC: 5.15 — ABNORMAL HIGH
WBC: 8.3

## 2011-09-11 LAB — GLUCOSE, CAPILLARY: Glucose-Capillary: 91

## 2011-09-11 LAB — COMPREHENSIVE METABOLIC PANEL
ALT: 35
Albumin: 3.1 — ABNORMAL LOW
Alkaline Phosphatase: 52
GFR calc Af Amer: >60
Potassium: 3.9
Sodium: 137
Total Protein: 6.6

## 2011-09-11 LAB — LIPID PANEL
HDL: 45
LDL Cholesterol: 105 — ABNORMAL HIGH
Triglycerides: 60
VLDL: 12

## 2011-09-11 LAB — POCT CARDIAC MARKERS
CKMB, poc: 1
CKMB, poc: 1.2
Myoglobin, poc: 70.2
Troponin i, poc: <0.05

## 2011-09-11 LAB — POCT I-STAT, CHEM 8
Calcium, Ion: 1.18
Creatinine, Ser: 0.9
Hemoglobin: 14.3
Sodium: 139
TCO2: 28

## 2011-09-11 LAB — CK TOTAL AND CKMB (NOT AT ARMC): Relative Index: 0.7

## 2011-09-11 LAB — BASIC METABOLIC PANEL
BUN: 7
Creatinine, Ser: 0.76
GFR calc Af Amer: >60
GFR calc non Af Amer: >60

## 2011-09-11 LAB — TROPONIN I: Troponin I: 0.01

## 2011-09-11 LAB — D-DIMER, QUANTITATIVE
D-Dimer, Quant: <0.22
D-Dimer, Quant: <0.22

## 2011-09-12 LAB — CBC
HCT: 34.5 — ABNORMAL LOW
HCT: 35.1 — ABNORMAL LOW
HCT: 37.5
HCT: 40.4
Hemoglobin: 11 — ABNORMAL LOW
Hemoglobin: 12.3
Hemoglobin: 13
Hemoglobin: 13.3
MCHC: 31.8
MCHC: 31.9
MCHC: 32.3
MCV: 78.5
MCV: 78.9
MCV: 79.7
MCV: 80
Platelets: 331
Platelets: 358
Platelets: 364
RBC: 4.3
RBC: 5.26 — ABNORMAL HIGH
RDW: 16.3 — ABNORMAL HIGH
RDW: 16.5 — ABNORMAL HIGH
RDW: 16.6 — ABNORMAL HIGH
RDW: 17 — ABNORMAL HIGH
WBC: 10.7 — ABNORMAL HIGH
WBC: 8.3

## 2011-09-12 LAB — URINE CULTURE: Colony Count: NO GROWTH

## 2011-09-12 LAB — POCT I-STAT, CHEM 8
BUN: 5 — ABNORMAL LOW
BUN: 7
Calcium, Ion: 1.3
Chloride: 103
Creatinine, Ser: 0.8
Creatinine, Ser: 0.9
Creatinine, Ser: 0.9
Glucose, Bld: 110 — ABNORMAL HIGH
Hemoglobin: 12.9
Potassium: 3.4 — ABNORMAL LOW
Sodium: 140
Sodium: 142
TCO2: 26
TCO2: 26
TCO2: 28

## 2011-09-12 LAB — GLUCOSE, CAPILLARY
Glucose-Capillary: 103 — ABNORMAL HIGH
Glucose-Capillary: 109 — ABNORMAL HIGH
Glucose-Capillary: 115 — ABNORMAL HIGH
Glucose-Capillary: 115 — ABNORMAL HIGH
Glucose-Capillary: 139 — ABNORMAL HIGH

## 2011-09-12 LAB — URINALYSIS, ROUTINE W REFLEX MICROSCOPIC
Glucose, UA: NEGATIVE
Hgb urine dipstick: NEGATIVE
Ketones, ur: NEGATIVE
Protein, ur: NEGATIVE
pH: 7.5

## 2011-09-12 LAB — CARDIAC PANEL(CRET KIN+CKTOT+MB+TROPI)
CK, MB: 0.8
Relative Index: 0.7
Relative Index: INVALID
Total CK: 99
Troponin I: 0.01

## 2011-09-12 LAB — ETHANOL: Alcohol, Ethyl (B): <5

## 2011-09-12 LAB — COMPREHENSIVE METABOLIC PANEL
Albumin: 3.4 — ABNORMAL LOW
BUN: 5 — ABNORMAL LOW
Chloride: 106
Creatinine, Ser: 0.72
GFR calc non Af Amer: >60
Total Bilirubin: 0.8

## 2011-09-12 LAB — DIFFERENTIAL
Basophils Absolute: 0.1
Basophils Relative: 1
Basophils Relative: 1
Eosinophils Absolute: 0
Eosinophils Absolute: 0.1
Eosinophils Absolute: 0.1
Eosinophils Absolute: 0.2
Eosinophils Relative: 1
Eosinophils Relative: 1
Lymphocytes Relative: 35
Lymphocytes Relative: 35
Lymphs Abs: 2.9
Lymphs Abs: 3.7
Monocytes Absolute: 0.5
Monocytes Absolute: 0.6
Monocytes Absolute: 0.6
Monocytes Relative: 6
Monocytes Relative: 7
Neutro Abs: 6.1
Neutro Abs: 6.1
Neutrophils Relative %: 57

## 2011-09-12 LAB — POCT CARDIAC MARKERS
CKMB, poc: 1.4
CKMB, poc: 1.4
CKMB, poc: <1 — ABNORMAL LOW
Myoglobin, poc: 63.6
Myoglobin, poc: 71.9
Myoglobin, poc: 88.5
Troponin i, poc: <0.05
Troponin i, poc: <0.05

## 2011-09-12 LAB — PROTIME-INR
INR: 1.7 — ABNORMAL HIGH
INR: 3.3 — ABNORMAL HIGH
Prothrombin Time: 16.8 — ABNORMAL HIGH
Prothrombin Time: 36.3 — ABNORMAL HIGH

## 2011-09-12 LAB — ANTIPHOSPHOLIPID SYNDROME EVAL, BLD
Anticardiolipin IgA: 12 — ABNORMAL LOW (ref <13)
Anticardiolipin IgG: <7 — ABNORMAL LOW (ref <11)
Anticardiolipin IgM: 8 — ABNORMAL LOW (ref <10)
Antiphosphatidylserine IgA: <20 APS U/mL (ref <20.0)
Antiphosphatidylserine IgM: <25 MPS U/mL (ref <25.0)
PTTLA 4:1 Mix: 188 — ABNORMAL HIGH (ref 36.3–48.8)
PTTLA Confirmation: 0 (ref <8.0)
dRVVT Incubated 1:1 Mix: 38.4 (ref 36.1–47.0)

## 2011-09-12 LAB — RAPID URINE DRUG SCREEN, HOSP PERFORMED
Amphetamines: NOT DETECTED
Barbiturates: NOT DETECTED
Benzodiazepines: NOT DETECTED
Cocaine: NOT DETECTED

## 2011-09-12 LAB — CK TOTAL AND CKMB (NOT AT ARMC): Relative Index: 0.8

## 2011-09-12 LAB — BASIC METABOLIC PANEL
CO2: 29
Chloride: 105
Glucose, Bld: 104 — ABNORMAL HIGH
Sodium: 139

## 2011-09-12 LAB — POCT PREGNANCY, URINE: Preg Test, Ur: NEGATIVE

## 2011-09-12 LAB — APTT: aPTT: 46 — ABNORMAL HIGH

## 2011-09-12 LAB — TROPONIN I: Troponin I: 0.01

## 2011-09-14 LAB — DIFFERENTIAL
Basophils Absolute: 0.2 10*3/uL — ABNORMAL HIGH (ref 0.0–0.1)
Eosinophils Relative: 1 % (ref 0–5)
Lymphocytes Relative: 44 % (ref 12–46)
Lymphs Abs: 4.3 10*3/uL — ABNORMAL HIGH (ref 0.7–4.0)
Neutro Abs: 4.8 10*3/uL (ref 1.7–7.7)

## 2011-09-14 LAB — CBC
HCT: 35.2 % — ABNORMAL LOW (ref 36.0–46.0)
HCT: 37 % (ref 36.0–46.0)
Hemoglobin: 11.5 g/dL — ABNORMAL LOW (ref 12.0–15.0)
Hemoglobin: 11.9 g/dL — ABNORMAL LOW (ref 12.0–15.0)
MCHC: 31.5 g/dL (ref 30.0–36.0)
MCV: 79 fL (ref 78.0–100.0)
Platelets: 350 10*3/uL (ref 150–400)
Platelets: 406 10*3/uL — ABNORMAL HIGH (ref 150–400)
RBC: 4.51 MIL/uL (ref 3.87–5.11)
RBC: 4.78 MIL/uL (ref 3.87–5.11)
RDW: 16.5 % — ABNORMAL HIGH (ref 11.5–15.5)
RDW: 16.5 % — ABNORMAL HIGH (ref 11.5–15.5)
WBC: 8.4 10*3/uL (ref 4.0–10.5)
WBC: 8.8 10*3/uL (ref 4.0–10.5)
WBC: 9.6 10*3/uL (ref 4.0–10.5)

## 2011-09-14 LAB — BASIC METABOLIC PANEL
BUN: 4 mg/dL — ABNORMAL LOW (ref 6–23)
CO2: 27 mEq/L (ref 19–32)
Calcium: 8.4 mg/dL (ref 8.4–10.5)
Calcium: 8.7 mg/dL (ref 8.4–10.5)
Chloride: 106 mEq/L (ref 96–112)
GFR calc Af Amer: >60 mL/min (ref >60)
GFR calc Af Amer: >60 mL/min (ref >60)
GFR calc non Af Amer: >60 mL/min (ref >60)
Glucose, Bld: 91 mg/dL (ref 70–99)
Potassium: 3.4 mEq/L — ABNORMAL LOW (ref 3.5–5.1)
Potassium: 3.8 mEq/L (ref 3.5–5.1)
Potassium: 3.8 mEq/L (ref 3.5–5.1)
Sodium: 138 mEq/L (ref 135–145)
Sodium: 138 mEq/L (ref 135–145)
Sodium: 138 mEq/L (ref 135–145)

## 2011-09-14 LAB — GLUCOSE, CAPILLARY
Glucose-Capillary: 102 mg/dL — ABNORMAL HIGH (ref 70–99)
Glucose-Capillary: 109 mg/dL — ABNORMAL HIGH (ref 70–99)
Glucose-Capillary: 126 mg/dL — ABNORMAL HIGH (ref 70–99)
Glucose-Capillary: 130 mg/dL — ABNORMAL HIGH (ref 70–99)
Glucose-Capillary: 130 mg/dL — ABNORMAL HIGH (ref 70–99)
Glucose-Capillary: 143 mg/dL — ABNORMAL HIGH (ref 70–99)
Glucose-Capillary: 153 mg/dL — ABNORMAL HIGH (ref 70–99)
Glucose-Capillary: 73 mg/dL (ref 70–99)
Glucose-Capillary: 82 mg/dL (ref 70–99)
Glucose-Capillary: 86 mg/dL (ref 70–99)
Glucose-Capillary: 86 mg/dL (ref 70–99)
Glucose-Capillary: 87 mg/dL (ref 70–99)
Glucose-Capillary: 89 mg/dL (ref 70–99)
Glucose-Capillary: 89 mg/dL (ref 70–99)
Glucose-Capillary: 92 mg/dL (ref 70–99)
Glucose-Capillary: 93 mg/dL (ref 70–99)
Glucose-Capillary: 96 mg/dL (ref 70–99)

## 2011-09-14 LAB — COMPREHENSIVE METABOLIC PANEL
AST: 46 U/L — ABNORMAL HIGH (ref 0–37)
Albumin: 3.9 g/dL (ref 3.5–5.2)
BUN: 5 mg/dL — ABNORMAL LOW (ref 6–23)
CO2: 25 mEq/L (ref 19–32)
Calcium: 8.9 mg/dL (ref 8.4–10.5)
Creatinine, Ser: 0.8 mg/dL (ref 0.4–1.2)
GFR calc Af Amer: >60 mL/min (ref >60)
GFR calc non Af Amer: >60 mL/min (ref >60)

## 2011-09-14 LAB — PROTIME-INR
INR: 2.2 — ABNORMAL HIGH (ref 0.00–1.49)
INR: 2.7 — ABNORMAL HIGH (ref 0.00–1.49)
INR: 3 — ABNORMAL HIGH (ref 0.00–1.49)
Prothrombin Time: 25.5 seconds — ABNORMAL HIGH (ref 11.6–15.2)
Prothrombin Time: 29.9 seconds — ABNORMAL HIGH (ref 11.6–15.2)
Prothrombin Time: 33.2 seconds — ABNORMAL HIGH (ref 11.6–15.2)

## 2011-09-14 LAB — ACTH: C206 ACTH: 17 pg/mL (ref 10–46)

## 2011-09-14 LAB — CK TOTAL AND CKMB (NOT AT ARMC)
Relative Index: 0.6 (ref 0.0–2.5)
Total CK: 217 U/L — ABNORMAL HIGH (ref 7–177)

## 2011-09-14 LAB — URINALYSIS, ROUTINE W REFLEX MICROSCOPIC
Ketones, ur: NEGATIVE mg/dL
Nitrite: NEGATIVE
Protein, ur: NEGATIVE mg/dL

## 2011-09-14 LAB — URINE DRUGS OF ABUSE SCREEN W ALC, ROUTINE (REF LAB)
Benzodiazepines.: NEGATIVE
Cocaine Metabolites: NEGATIVE
Ethyl Alcohol: <10 mg/dL (ref <10)
Opiate Screen, Urine: NEGATIVE
Phencyclidine (PCP): NEGATIVE
Propoxyphene: NEGATIVE

## 2011-09-14 LAB — T4: T4, Total: 7.4 ug/dL (ref 5.0–12.5)

## 2011-09-14 LAB — PREGNANCY, URINE: Preg Test, Ur: NEGATIVE

## 2011-09-14 LAB — ACTH STIMULATION, 3 TIME POINTS: Cortisol, Base: 8 ug/dL

## 2011-09-14 LAB — TSH: TSH: 3.073 u[IU]/mL (ref 0.350–4.500)

## 2011-09-14 LAB — PROLACTIN: Prolactin: 20.2 ng/mL

## 2011-09-14 LAB — APTT: aPTT: 34 seconds (ref 24–37)

## 2011-09-14 LAB — CORTISOL: Cortisol, Plasma: 5.9 ug/dL

## 2011-09-14 LAB — TROPONIN I: Troponin I: <0.01 ng/mL (ref 0.00–0.06)

## 2011-09-14 LAB — MAGNESIUM: Magnesium: 2 mg/dL (ref 1.5–2.5)

## 2011-09-18 ENCOUNTER — Ambulatory Visit: Payer: Medicare Other | Admitting: Sports Medicine

## 2011-10-09 ENCOUNTER — Ambulatory Visit (INDEPENDENT_AMBULATORY_CARE_PROVIDER_SITE_OTHER): Payer: Medicare Other | Admitting: Internal Medicine

## 2011-10-09 ENCOUNTER — Encounter: Payer: Self-pay | Admitting: Internal Medicine

## 2011-10-09 VITALS — BP 133/91 | HR 82 | Temp 97.4°F | Ht 60.0 in | Wt 180.1 lb

## 2011-10-09 DIAGNOSIS — D5700 Hb-SS disease with crisis, unspecified: Secondary | ICD-10-CM

## 2011-10-09 DIAGNOSIS — E119 Type 2 diabetes mellitus without complications: Secondary | ICD-10-CM

## 2011-10-09 LAB — GLUCOSE, CAPILLARY: Glucose-Capillary: 259 mg/dL — ABNORMAL HIGH (ref 70–99)

## 2011-10-09 MED ORDER — HYDROXYUREA 200 MG PO CAPS: 200.0000 mg | ORAL_CAPSULE | Freq: Every day | ORAL | Status: DC

## 2011-10-09 MED ORDER — METFORMIN HCL 1000 MG PO TABS: 1000.0000 mg | ORAL_TABLET | Freq: Two times a day (BID) | ORAL | Status: DC

## 2011-10-09 MED ORDER — FOLIC ACID 1 MG PO TABS: 1.0000 mg | ORAL_TABLET | Freq: Every day | ORAL | Status: DC

## 2011-10-09 NOTE — Patient Instructions (Addendum)
Please, note that your metformin dose has been changed to 1000 mg one tablet twice a day. PPlease, stop Ibuprofen. Lets, repeat HgbA1C in 12 weeks and bring your meter next office visit after Christmas). Please, call with any questions.

## 2011-10-09 NOTE — Progress Notes (Signed)
Subjective:   Patient ID: Makayla Frazier female   DOB: [birth year] 40 y.o.   MRN: [id number]  HPI: Makayla Frazier is a 40 y.o. woman is here for a "follow up on her diabetes." forgot to bring her meter. Reports CBG's in the 200's range.Denies any hypoglycemic events. Reprots taking Metformin 500 mg ONE tab PO bid (instead of TWO tabs PO bid).    Past Medical History  Diagnosis Date  . PE (pulmonary embolism)     2009 - on oral contraception  . Syncopal episodes     unknown etiology  . DM (diabetes mellitus)   . Dysfunctional uterine bleeding     Seen by Dr. Pennie Rushing, GYN  . Asthma   . OSA (obstructive sleep apnea)   . Major depression     Hx suicide attempt in 2009, [facility] admissions  . Syncope and collapse     unknow etiology extensive w/u neuro,cardiology  . Obesity   . Mastodynia    Current Outpatient Prescriptions  Medication Sig Dispense Refill  . ADVAIR DISKUS 100-50 MCG/DOSE AEPB       . albuterol (PROVENTIL,VENTOLIN) 90 MCG/ACT inhaler 1-2 puffs every 4-6 hours as needed for wheezing or shortness of breath       . aspirin (ASPIR-LOW) 81 MG EC tablet Take 81 mg by mouth daily.        . Insulin Pen Needle (EASY TOUCH PEN NEEDLES) 31G X 5 MM MISC Use to inject Victoza once a day at the same time  31 each  3  . Liraglutide (VICTOZA) 18 MG/3ML SOLN Inject 0.9 mg into the skin daily.        Marland Kitchen lisinopril (PRINIVIL,ZESTRIL) 5 MG tablet Take 1 tablet (5 mg total) by mouth daily.  31 tablet  3  . metFORMIN (GLUCOPHAGE XR) 500 MG 24 hr tablet Take 2 tablets (1,000 mg total) by mouth 2 (two) times daily.  62 tablet  11  . nitroGLYCERIN (NITRODUR - DOSED IN MG/24 HR) 0.2 mg/hr Use a 1/4 patch for 24 hours. Switch positions of the patch every 24 hours  30 patch  1  . pantoprazole (PROTONIX) 40 MG tablet        Family History  Problem Relation Age of Onset  . Melanoma Father 36  . Ulcerative colitis Mother 22  . Breast cancer Paternal Grandmother   . Diabetes type II  Maternal Grandmother     deceased 34  . Pulmonary embolism Paternal Grandfather    History   Social History  . Marital Status: Single    Spouse Name: N/A    Number of Children: N/A  . Years of Education: N/A   Occupational History  . 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 Smoking status: Never Smoker   . Smokeless tobacco: None  . Alcohol Use: No  . Drug Use: No  . Sexually Active: Yes    Birth Control/ Protection: IUD   Other Topics Concern  . None   Social History Narrative   Lives in [place] with significant other, WilliamCompleted 10th grade.Worker Compensation Case 2009-2012 related to PE   Review of Systems: Constitutional: Denies fever, chills, diaphoresis, appetite change and fatigue.  HEENT: Denies photophobia, eye pain, redness, hearing loss, ear pain, congestion, sore throat, rhinorrhea, sneezing, mouth sores, trouble swallowing, neck pain, neck stiffness and tinnitus.   Respiratory: Denies SOB, DOE, cough, chest tightness,  and wheezing.   Cardiovascular: Denies  chest pain, palpitations and leg swelling.  Gastrointestinal: Denies nausea, vomiting, abdominal pain, diarrhea, constipation, blood in stool and abdominal distention.  Genitourinary: Denies dysuria, urgency, frequency, hematuria, flank pain and difficulty urinating.  Musculoskeletal: Denies myalgias, back pain, joint swelling, arthralgias and gait problem.  Skin: Denies pallor, rash and wound.  Neurological: Denies dizziness, seizures, syncope, weakness, light-headedness, numbness and headaches.  Hematological: Denies adenopathy. Easy bruising, personal or family bleeding history  Psychiatric/Behavioral: Denies suicidal ideation, mood changes, confusion, nervousness, sleep disturbance and agitation  Objective:  Physical Exam: Filed Vitals:   10/09/11 1551  BP: 133/91  Pulse: 82  Temp: 97.4 F (36.3 C)  TempSrc: Oral  Height: 5' (1.524 m)  Weight:  180 lb 1.6 oz (81.693 kg)   Constitutional: Vital signs reviewed.  Patient is a well-developed and well-nourished woman in no acute distress and cooperative with exam. Alert and oriented x3.  Head: Normocephalic and atraumatic Ear: TM normal bilaterally Mouth: no erythema or exudates, MMM Eyes: PERRL, EOMI, conjunctivae normal, No scleral icterus.  Neck: Supple, Trachea midline normal ROM, No JVD, mass, thyromegaly, or carotid bruit present.  Cardiovascular: RRR, S1 normal, S2 normal, no MRG, pulses symmetric and intact bilaterally Pulmonary/Chest: CTAB, no wheezes, rales, or rhonchi Abdominal: Soft. Non-tender, non-distended, bowel sounds are normal, no masses, organomegaly, or guarding present.  GU: no CVA tenderness Musculoskeletal: No joint deformities, erythema, or stiffness, ROM full and no nontender Hematology: no cervical, inginal, or axillary adenopathy.  Neurological: A&O x3, Strenght is normal and symmetric bilaterally, cranial nerve II-XII are grossly intact, no focal motor deficit, sensory intact to light touch bilaterally.  Skin: Warm, dry and intact. No rash, cyanosis, or clubbing.  Psychiatric: Normal mood and affect. speech and behavior is normal. Judgment and thought content normal. Cognition and memory are normal.   Assessment & Plan:  1. DM, type 2; poorly controlled due to lack of adherence with diet, exercise and medications. -Risks of DM reviewed with the patient. -Foot care, medications discussed with the patient. -Increase metformin to 1 gm PO bid -bring glucometer with each OV -refused to See DME Ms. Plyler.  2. Called the patient instructed to not to fill  In a Rx for hydroxyurea and folate. Explained that was given in error. Instructed to call with any questions. The patient's pharmacy ([facility] spoke with Jospeh)  was contacted also and notified fo the error and rx were canceled.

## 2011-10-09 NOTE — Progress Notes (Deleted)
  Subjective:    Patient ID: Makayla Frazier, female    DOB: [birth year], 40 y.o.   MRN: [id number]  HPI    Review of Systems     Objective:   Physical Exam        Assessment & Plan:

## 2011-10-10 ENCOUNTER — Encounter: Payer: Self-pay | Admitting: Internal Medicine

## 2011-10-10 ENCOUNTER — Telehealth: Payer: Self-pay | Admitting: *Deleted

## 2011-10-10 NOTE — Telephone Encounter (Signed)
Called the patient and instructed to not to fill in Rx for Hydroxyurea and Folic acid; rationale was explained. I will reviewed both Epic and Echart records in order to investigate whether the patient was in fact diagnosed with a sickle cell disease. Patient verbalized understanding. She was also instructed to contact our clinic with any concerns in the future.

## 2011-10-10 NOTE — Telephone Encounter (Signed)
[facility] calls and states pt states someone called and left message for her not to take her medicine, did not say which ones per pt, she states she has tried to call all am and cannot get anyone??? So pt called [facility]. i reviewed the visit note from 10/29 and told [facility] that pt should not take the folate nor the hydroxyurea, she states she will relay message to pt BUT PT WILL WANT TO KNOW EXACTLY WHY, PLEASE CALL PT AT THE [facility] # LISTED IN SNAPSHOT, the pt would like a call today.  Thank you,h.

## 2011-10-12 DIAGNOSIS — N62 Hypertrophy of breast: Secondary | ICD-10-CM | POA: Insufficient documentation

## 2011-11-29 ENCOUNTER — Encounter: Payer: Self-pay | Admitting: Internal Medicine

## 2011-11-29 ENCOUNTER — Ambulatory Visit (INDEPENDENT_AMBULATORY_CARE_PROVIDER_SITE_OTHER): Payer: Medicare Other | Admitting: *Deleted

## 2011-11-29 DIAGNOSIS — R55 Syncope and collapse: Secondary | ICD-10-CM

## 2011-11-29 LAB — PACEMAKER DEVICE OBSERVATION

## 2011-11-29 NOTE — Progress Notes (Signed)
ILR check 

## 2011-12-01 ENCOUNTER — Encounter: Payer: Medicare Other | Admitting: Internal Medicine

## 2011-12-12 DIAGNOSIS — N9089 Other specified noninflammatory disorders of vulva and perineum: Secondary | ICD-10-CM

## 2011-12-12 HISTORY — DX: Other specified noninflammatory disorders of vulva and perineum: N90.89

## 2012-01-16 ENCOUNTER — Encounter: Payer: Medicare Other | Admitting: Internal Medicine

## 2012-01-29 ENCOUNTER — Encounter: Payer: Self-pay | Admitting: Internal Medicine

## 2012-01-29 ENCOUNTER — Ambulatory Visit (INDEPENDENT_AMBULATORY_CARE_PROVIDER_SITE_OTHER): Payer: Medicare Other | Admitting: Internal Medicine

## 2012-01-29 VITALS — BP 104/58 | HR 104 | Wt 177.8 lb

## 2012-01-29 DIAGNOSIS — R55 Syncope and collapse: Secondary | ICD-10-CM

## 2012-01-29 LAB — PACEMAKER DEVICE OBSERVATION

## 2012-01-29 NOTE — Progress Notes (Signed)
PCP:  Dede Query, MD, MD  The patient presents today for routine electrophysiology followup.  Since last being seen in our clinic, the patient reports doing reasonably well.  She continues to have occasional "blackout spells".  These are more common at night or when "tired".  She has not followed up with neurology as recommended by me.  She does not drive.  Today, she denies symptoms of palpitations, chest pain, shortness of breath, lower extremity edema, or other concerns.  Past Medical History  Diagnosis Date  . PE (pulmonary embolism)     2009 - on oral contraception  . Syncopal episodes     unknown etiology  . DM (diabetes mellitus)   . Dysfunctional uterine bleeding     Seen by Dr. Pennie Rushing, GYN  . Asthma   . OSA (obstructive sleep apnea)   . Major depression     Hx suicide attempt in 2009, [facility] admissions  . Syncope and collapse     unknow etiology extensive w/u cardiology  . Obesity   . Mastodynia    Past Surgical History  Procedure Date  . Breast reduction surgery     Dr. Kathie Dike [facility] 2011  . Cholecystectomy   . Cardiac electrophysiology study & dft     Implantable Loop recorder    Current Outpatient Prescriptions  Medication Sig Dispense Refill  . albuterol (PROVENTIL,VENTOLIN) 90 MCG/ACT inhaler 1-2 puffs every 4-6 hours as needed for wheezing or shortness of breath       . aspirin (ASPIR-LOW) 81 MG EC tablet Take 81 mg by mouth daily.        . Insulin Pen Needle (EASY TOUCH PEN NEEDLES) 31G X 5 MM MISC Use to inject Victoza once a day at the same time  31 each  3  . lisinopril (PRINIVIL,ZESTRIL) 5 MG tablet Take 1 tablet (5 mg total) by mouth daily.  31 tablet  3  . metFORMIN (GLUCOPHAGE) 1000 MG tablet Take 1 tablet (1,000 mg total) by mouth 2 (two) times daily with a meal.  60 tablet  11    Allergies  Allergen Reactions  . Hydrocodone-Acetaminophen   . Latex   . Oxycodone-Acetaminophen     History   Social History  . Marital Status: Single    Spouse Name:  N/A    Number of Children: N/A  . Years of Education: N/A   Occupational History  . 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 Smoking status: Never Smoker   . Smokeless tobacco: Not on file  . Alcohol Use: No  . Drug Use: No  . Sexually Active: Yes    Birth Control/ Protection: IUD   Other Topics Concern  . Not on file   Social History Narrative   Lives in [place] with significant other, WilliamCompleted 10th grade.Worker Compensation Case 2009-2012 related to PE    Family History  Problem Relation Age of Onset  . Melanoma Father 53  . Ulcerative colitis Mother 34  . Breast cancer Paternal Grandmother   . Diabetes type II Maternal Grandmother     deceased 17  . Pulmonary embolism Paternal Grandfather    Physical Exam: Filed Vitals:   01/29/12 1039  BP: 104/58  Pulse: 104  Weight: 177 lb 12.8 oz (80.65 kg)    GEN- The patient is well appearing, alert and oriented x 3 today.   Head- normocephalic, atraumatic Eyes-  Sclera clear, conjunctiva pink Ears- hearing intact  Oropharynx- clear Neck- supple, no JVP Lymph- no cervical lymphadenopathy Lungs- Clear to ausculation bilaterally, normal work of breathing Heart- Regular rate and rhythm, no murmurs, rubs or gallops, PMI not laterally displaced GI- soft, NT, ND, + BS Extremities- no clubbing, cyanosis, or edema  ILR reveals no arrhythmias   Assessment and Plan:

## 2012-01-29 NOTE — Patient Instructions (Signed)
Remote monitoring is used to monitor your Pacemaker of ICD from home. This monitoring reduces the number of office visits required to check your device to one time per year. It allows us to keep an eye on the functioning of your device to ensure it is working properly. You are scheduled for a device check from home on May 02, 2012. You may send your transmission at any time that day. If you have a wireless device, the transmission will be sent automatically. After your physician reviews your transmission, you will receive a postcard with your next transmission date.  Your physician wants you to follow-up in: 1 year with Dr Allred.  You will receive a reminder letter in the mail two months in advance. If you don't receive a letter, please call our office to schedule the follow-up appointment.  

## 2012-01-29 NOTE — Assessment & Plan Note (Signed)
Implantable loop recorder has shown no arrhythmias to correspond to episodes The episode that she had several years ago in my office was an episode of LOC with clear scanning of the eyes and most suggestive of seizure activity.  She has not followed up with neurology.  As this is not a cardiac issue, I have encouraged her to follow-up with neurology.  I have again advised no driving. She will return in 12 months for loop interrogation.  We will place her on carelink for remote monitoring of the device.

## 2012-02-23 ENCOUNTER — Telehealth: Payer: Self-pay | Admitting: Internal Medicine

## 2012-02-23 NOTE — Telephone Encounter (Signed)
LOv,Pacer Check,Echo,12 lead faxed to [facility] @ [phone] 02/23/12/KM

## 2012-03-18 ENCOUNTER — Emergency Department (HOSPITAL_COMMUNITY)
Admission: EM | Admit: 2012-03-18 | Discharge: 2012-03-18 | Disposition: A | Discharge status: Hospice | Payer: Medicare Other | Attending: Emergency Medicine | Admitting: Emergency Medicine

## 2012-03-18 ENCOUNTER — Emergency Department (HOSPITAL_COMMUNITY): Payer: Medicare Other

## 2012-03-18 ENCOUNTER — Encounter (HOSPITAL_COMMUNITY): Payer: Self-pay | Admitting: Emergency Medicine

## 2012-03-18 ENCOUNTER — Other Ambulatory Visit: Payer: Self-pay

## 2012-03-18 DIAGNOSIS — R739 Hyperglycemia, unspecified: Secondary | ICD-10-CM

## 2012-03-18 DIAGNOSIS — E119 Type 2 diabetes mellitus without complications: Secondary | ICD-10-CM | POA: Insufficient documentation

## 2012-03-18 DIAGNOSIS — R071 Chest pain on breathing: Secondary | ICD-10-CM | POA: Insufficient documentation

## 2012-03-18 DIAGNOSIS — Z79899 Other long term (current) drug therapy: Secondary | ICD-10-CM | POA: Insufficient documentation

## 2012-03-18 DIAGNOSIS — R0789 Other chest pain: Secondary | ICD-10-CM

## 2012-03-18 DIAGNOSIS — J45909 Unspecified asthma, uncomplicated: Secondary | ICD-10-CM | POA: Insufficient documentation

## 2012-03-18 DIAGNOSIS — R079 Chest pain, unspecified: Secondary | ICD-10-CM | POA: Insufficient documentation

## 2012-03-18 LAB — D-DIMER, QUANTITATIVE: D-Dimer, Quant: 0.42 ug/mL-FEU (ref 0.00–0.48)

## 2012-03-18 LAB — POCT I-STAT, CHEM 8
BUN: 8 mg/dL (ref 6–23)
Calcium, Ion: 1.26 mmol/L (ref 1.12–1.32)
Glucose, Bld: 243 mg/dL — ABNORMAL HIGH (ref 70–99)
HCT: 45 % (ref 36.0–46.0)
TCO2: 27 mmol/L (ref 0–100)

## 2012-03-18 LAB — POCT I-STAT TROPONIN I: Troponin i, poc: 0 ng/mL (ref 0.00–0.08)

## 2012-03-18 MED ORDER — HYDROMORPHONE HCL PF 1 MG/ML IJ SOLN
1.0000 mg | Freq: Once | INTRAMUSCULAR | Status: AC
  Administered 2012-03-18: 1 mg via INTRAVENOUS
  Filled 2012-03-18: qty 1

## 2012-03-18 MED ORDER — TRAMADOL HCL 50 MG PO TABS: 50.0000 mg | ORAL_TABLET | Freq: Four times a day (QID) | ORAL | Status: AC | PRN

## 2012-03-18 NOTE — ED Notes (Signed)
Pt presenting to ed with c/o chest pain,shortness of breath since last night with nausea. Pt is alert and oriented at this time

## 2012-03-18 NOTE — Discharge Instructions (Signed)
Chest Wall Pain Chest wall pain is pain in or around the bones and muscles of your chest. It may take up to 6 weeks to get better. It may take longer if you must stay physically active in your work and activities.  CAUSES  Chest wall pain may happen on its own. However, it may be caused by:  A viral illness like the flu.   Injury.   Coughing.   Exercise.   Arthritis.   Fibromyalgia.   Shingles.  HOME CARE INSTRUCTIONS   Avoid overtiring physical activity. Try not to strain or perform activities that cause pain. This includes any activities using your chest or your abdominal and side muscles, especially if heavy weights are used.   Put ice on the sore area.   Put ice in a plastic bag.   Place a towel between your skin and the bag.   Leave the ice on for 15 to 20 minutes per hour while awake for the first 2 days.   Only take over-the-counter or prescription medicines for pain, discomfort, or fever as directed by your caregiver.  SEEK IMMEDIATE MEDICAL CARE IF:   Your pain increases, or you are very uncomfortable.   You have a fever.   Your chest pain becomes worse.   You have new, unexplained symptoms.   You have nausea or vomiting.   You feel sweaty or lightheaded.   You have a cough with phlegm (sputum), or you cough up blood.  MAKE SURE YOU:   Understand these instructions.   Will watch your condition.   Will get help right away if you are not doing well or get worse.  Document Released: 11/27/2005 Document Revised: 11/16/2011 Document Reviewed: 07/24/2011 [facility] Patient Information 2012 [place], [place].  See your Dr. if not improved in a week. Your blood sugar was mildly elevated tonight at 243. Return if her condition worsens for any reason

## 2012-03-18 NOTE — ED Notes (Signed)
Pt not yet in room.

## 2012-03-18 NOTE — ED Provider Notes (Addendum)
History     CSN: [id number]  Arrival date & time 03/18/12  1752   First MD Initiated Contact with Patient 03/18/12 1959      Chief Complaint  Patient presents with  . Chest Pain    (Consider location/radiation/quality/duration/timing/severity/associated sxs/prior treatment) HPI Complains of left-sided posterior chest pain at scapula radiating to anterior left chest onset approximately 24 hours ago constant worse with bending or twisting her torso pleuritic in quality no associated shortness of breath nausea or sweatiness no lightheadedness no cough no fever. Treated with ibuprofen without relief. Symptoms do not feel like pulmonary embolism she has suffered from the past when she had prolonged cough. She has since stopped birth control pills. No other associated symptoms pain is moderate at present Past Medical History  Diagnosis Date  . PE (pulmonary embolism)     2009 - on oral contraception  . Syncopal episodes     unknown etiology  . DM (diabetes mellitus)   . Dysfunctional uterine bleeding     Seen by Dr. Pennie Rushing, GYN  . Asthma   . OSA (obstructive sleep apnea)   . Major depression     Hx suicide attempt in 2009, [facility] admissions  . Syncope and collapse     unknow etiology extensive w/u cardiology  . Obesity   . Mastodynia     Past Surgical History  Procedure Date  . Breast reduction surgery     Dr. Kathie Dike [facility] 2011  . Cholecystectomy   . Cardiac electrophysiology study & dft     Implantable Loop recorder    Family History  Problem Relation Age of Onset  . Melanoma Father 47  . Ulcerative colitis Mother 34  . Breast cancer Paternal Grandmother   . Diabetes type II Maternal Grandmother     deceased 47  . Pulmonary embolism Paternal Grandfather     History  Substance Use Topics  . Smoking status: Never Smoker   . Smokeless tobacco: Not on file  . Alcohol Use: No    OB History    Grav Para Term Preterm Abortions TAB SAB Ect Mult Living                  Review of Systems  Constitutional: Negative.   HENT: Negative.   Respiratory: Negative.   Cardiovascular: Positive for chest pain.  Gastrointestinal: Negative.   Musculoskeletal: Negative.   Skin: Negative.   Neurological: Negative.   Hematological: Negative.   Psychiatric/Behavioral: Negative.   All other systems reviewed and are negative.    Allergies  Hydrocodone-acetaminophen; Latex; and Oxycodone-acetaminophen  Home Medications   Current Outpatient Rx  Name Route Sig Dispense Refill  . ALBUTEROL 90 MCG/ACT IN AERS  1-2 puffs every 4-6 hours as needed for wheezing or shortness of breath     . ASPIRIN 81 MG PO TBEC Oral Take 81 mg by mouth daily.      . IBUPROFEN 400 MG PO TABS Oral Take 400 mg by mouth every 6 (six) hours as needed. For pain relief    . INSULIN GLARGINE 100 UNIT/ML [place] SOLN Subcutaneous Inject 10 Units into the skin at bedtime.    . INSULIN PEN NEEDLE 31G X 5 MM MISC  Use to inject Victoza once a day at the same time 31 each 3  . LISINOPRIL 5 MG PO TABS Oral Take 1 tablet (5 mg total) by mouth daily. 31 tablet 3    BP 151/100  Pulse 93  Temp(Src) 98.9 F (37.2 C) (Oral)  Resp 23  SpO2 100%  LMP 03/11/2012  Physical Exam  Nursing note and vitals reviewed. Constitutional: She appears well-developed and well-nourished.  HENT:  Head: Normocephalic and atraumatic.  Eyes: Conjunctivae are normal. Pupils are equal, round, and reactive to light.  Neck: Neck supple. No tracheal deviation present. No thyromegaly present.  Cardiovascular: Normal rate and regular rhythm.   No murmur heard. Pulmonary/Chest: Effort normal and breath sounds normal.       Left lateral chest wall is tender, reproducing pain exactly pain is also reproduced by forcible abduction of left shoulder  Abdominal: Soft. Bowel sounds are normal. She exhibits no distension. There is no tenderness.       Obese  Musculoskeletal: Normal range of motion. She exhibits no edema and no  tenderness.  Neurological: She is alert. Coordination normal.  Skin: Skin is warm and dry. No rash noted.  Psychiatric: She has a normal mood and affect.    Date: 03/18/2012  Rate: 102  Rhythm: sinus tachycardia  QRS Axis: normal  Intervals: normal  ST/T Wave abnormalities: normal  Conduction Disutrbances:none  Narrative Interpretation:   Old EKG Reviewed: changes noted  PVC not present on prior tracing 07/30/2011. Also prior tracing showed sinus rhythm 75 beats per minute, otherwise unchanged  ED Course  Procedures (including critical care time) 10:15 PM pain gone patient resting comfortably Labs Reviewed - No data to display No results found.   No diagnosis found.  Results for orders placed during the hospital encounter of 03/18/12  D-DIMER, QUANTITATIVE      Component Value Range   D-Dimer, Quant 0.42  0.00 - 0.48 (ug/mL-FEU)  POCT I-STAT, CHEM 8      Component Value Range   Sodium 138  135 - 145 (mEq/L)   Potassium 3.8  3.5 - 5.1 (mEq/L)   Chloride 102  96 - 112 (mEq/L)   BUN 8  6 - 23 (mg/dL)   Creatinine, Ser 2.13  0.50 - 1.10 (mg/dL)   Glucose, Bld 086 (*) 70 - 99 (mg/dL)   Calcium, Ion 5.78  4.69 - 1.32 (mmol/L)   TCO2 27  0 - 100 (mmol/L)   Hemoglobin 15.3 (*) 12.0 - 15.0 (g/dL)   HCT 62.9  52.8 - 41.3 (%)  POCT I-STAT TROPONIN I      Component Value Range   Troponin i, poc 0.00  0.00 - 0.08 (ng/mL)   Comment 3            Dg Chest 2 View  03/18/2012  *RADIOLOGY REPORT*  Clinical Data: Chest pain, syncope  CHEST - 2 VIEW  Comparison: 07/29/2011  Findings: Cardiac monitor over the left chest.  Normal heart size and vascularity.  Negative for CHF or pneumonia.  No effusion or pneumothorax.  Trachea midline. Prior cholecystectomy evident.  IMPRESSION: No acute finding.  Stable exam.  Original Report Authenticated By: Judie Petit. Ruel Favors, M.D.     MDM  Extremely low suspicion for pulmonary embolism, symptoms unlike her prior PE, negative d-dimer; strongly doubt ACS  highly atypical symptoms not acute ECG negative troponin Exam and history consistent with musculoskeletal chest pain Plan prescription tramadol Followup PMD if not better one week Diagnosis #1 musculoskeletal chest pain #2 hyperglycemia        Doug Sou, MD 03/18/12 2227  Doug Sou, MD 03/19/12 2440

## 2012-03-18 NOTE — ED Notes (Signed)
MD at bedside. 

## 2012-05-02 ENCOUNTER — Encounter: Payer: Medicare Other | Admitting: *Deleted

## 2012-05-03 ENCOUNTER — Encounter: Payer: Self-pay | Admitting: *Deleted

## 2012-05-18 ENCOUNTER — Encounter (HOSPITAL_COMMUNITY): Payer: Self-pay | Admitting: Emergency Medicine

## 2012-05-18 ENCOUNTER — Emergency Department (HOSPITAL_COMMUNITY): Payer: Medicare Other

## 2012-05-18 ENCOUNTER — Inpatient Hospital Stay (HOSPITAL_COMMUNITY)
Admission: EM | Admit: 2012-05-18 | Discharge: 2012-05-19 | DRG: 176 | Disposition: A | Discharge status: Home / Self Care | Payer: Medicare Other | Attending: Family Medicine | Admitting: Family Medicine

## 2012-05-18 DIAGNOSIS — E118 Type 2 diabetes mellitus with unspecified complications: Secondary | ICD-10-CM | POA: Diagnosis present

## 2012-05-18 DIAGNOSIS — J45909 Unspecified asthma, uncomplicated: Secondary | ICD-10-CM | POA: Diagnosis present

## 2012-05-18 DIAGNOSIS — T380X5A Adverse effect of glucocorticoids and synthetic analogues, initial encounter: Secondary | ICD-10-CM | POA: Diagnosis present

## 2012-05-18 DIAGNOSIS — I10 Essential (primary) hypertension: Secondary | ICD-10-CM | POA: Diagnosis present

## 2012-05-18 DIAGNOSIS — R51 Headache: Secondary | ICD-10-CM | POA: Diagnosis present

## 2012-05-18 DIAGNOSIS — D72829 Elevated white blood cell count, unspecified: Secondary | ICD-10-CM | POA: Diagnosis present

## 2012-05-18 DIAGNOSIS — I2699 Other pulmonary embolism without acute cor pulmonale: Secondary | ICD-10-CM

## 2012-05-18 DIAGNOSIS — Z86711 Personal history of pulmonary embolism: Secondary | ICD-10-CM

## 2012-05-18 DIAGNOSIS — E1165 Type 2 diabetes mellitus with hyperglycemia: Secondary | ICD-10-CM | POA: Diagnosis present

## 2012-05-18 DIAGNOSIS — IMO0002 Reserved for concepts with insufficient information to code with codable children: Secondary | ICD-10-CM | POA: Diagnosis present

## 2012-05-18 HISTORY — DX: Essential (primary) hypertension: I10

## 2012-05-18 LAB — CBC
Hemoglobin: 13.1 g/dL (ref 12.0–15.0)
MCH: 26.4 pg (ref 26.0–34.0)
MCHC: 33.9 g/dL (ref 30.0–36.0)
MCV: 77.9 fL — ABNORMAL LOW (ref 78.0–100.0)

## 2012-05-18 LAB — DIFFERENTIAL
Basophils Relative: 0 % (ref 0–1)
Eosinophils Absolute: 0.1 10*3/uL (ref 0.0–0.7)
Eosinophils Relative: 1 % (ref 0–5)
Lymphs Abs: 3.5 10*3/uL (ref 0.7–4.0)
Monocytes Relative: 5 % (ref 3–12)
Neutrophils Relative %: 62 % (ref 43–77)

## 2012-05-18 LAB — COMPREHENSIVE METABOLIC PANEL
Albumin: 3.5 g/dL (ref 3.5–5.2)
Alkaline Phosphatase: 63 U/L (ref 39–117)
BUN: 8 mg/dL (ref 6–23)
Calcium: 9.5 mg/dL (ref 8.4–10.5)
Creatinine, Ser: 0.7 mg/dL (ref 0.50–1.10)
GFR calc Af Amer: >90 mL/min (ref >90)
Glucose, Bld: 358 mg/dL — ABNORMAL HIGH (ref 70–99)
Potassium: 3.8 mEq/L (ref 3.5–5.1)
Total Protein: 7.6 g/dL (ref 6.0–8.3)

## 2012-05-18 LAB — CARDIAC PANEL(CRET KIN+CKTOT+MB+TROPI)
CK, MB: 1.6 ng/mL (ref 0.3–4.0)
Total CK: 71 U/L (ref 7–177)
Troponin I: <0.3 ng/mL (ref <0.30)

## 2012-05-18 LAB — URINALYSIS, ROUTINE W REFLEX MICROSCOPIC
Glucose, UA: >1000 mg/dL — AB
Ketones, ur: NEGATIVE mg/dL
Leukocytes, UA: NEGATIVE
Nitrite: NEGATIVE
Specific Gravity, Urine: 1.043 — ABNORMAL HIGH (ref 1.005–1.030)
pH: 5.5 (ref 5.0–8.0)

## 2012-05-18 LAB — D-DIMER, QUANTITATIVE: D-Dimer, Quant: 1.02 ug/mL-FEU — ABNORMAL HIGH (ref 0.00–0.48)

## 2012-05-18 LAB — PREGNANCY, URINE: Preg Test, Ur: NEGATIVE

## 2012-05-18 MED ORDER — ERGOCALCIFEROL 1.25 MG (50000 UT) PO CAPS
50000.0000 [IU] | ORAL_CAPSULE | ORAL | Status: DC
  Filled 2012-05-18: qty 1

## 2012-05-18 MED ORDER — NITROGLYCERIN 0.4 MG SL SUBL
0.4000 mg | SUBLINGUAL_TABLET | SUBLINGUAL | Status: DC | PRN
Start: 2012-05-18 — End: 2012-05-18

## 2012-05-18 MED ORDER — METOCLOPRAMIDE HCL 5 MG/ML IJ SOLN
10.0000 mg | Freq: Once | INTRAMUSCULAR | Status: AC
  Administered 2012-05-18: 10 mg via INTRAVENOUS
  Filled 2012-05-18: qty 2

## 2012-05-18 MED ORDER — IBUPROFEN 600 MG PO TABS
600.0000 mg | ORAL_TABLET | Freq: Four times a day (QID) | ORAL | Status: DC | PRN
  Administered 2012-05-19: 600 mg via ORAL
  Filled 2012-05-18 (×2): qty 1

## 2012-05-18 MED ORDER — INSULIN ASPART 100 UNIT/ML ~~LOC~~ SOLN
0.0000 [IU] | Freq: Every day | SUBCUTANEOUS | Status: DC
  Administered 2012-05-18: 4 [IU] via SUBCUTANEOUS

## 2012-05-18 MED ORDER — SODIUM CHLORIDE 0.9 % IV SOLN
INTRAVENOUS | Status: DC
  Administered 2012-05-19: 1000 mL via INTRAVENOUS
  Administered 2012-05-19: 07:00:00 via INTRAVENOUS

## 2012-05-18 MED ORDER — SODIUM CHLORIDE 0.9 % IV SOLN: 250.0000 mL | INTRAVENOUS | Status: DC | PRN

## 2012-05-18 MED ORDER — ALBUTEROL SULFATE HFA 108 (90 BASE) MCG/ACT IN AERS: 2.0000 | INHALATION_SPRAY | RESPIRATORY_TRACT | Status: DC | PRN

## 2012-05-18 MED ORDER — INSULIN GLARGINE 100 UNIT/ML ~~LOC~~ SOLN
25.0000 [IU] | Freq: Every day | SUBCUTANEOUS | Status: DC
  Administered 2012-05-18: 25 [IU] via SUBCUTANEOUS

## 2012-05-18 MED ORDER — ONDANSETRON HCL 4 MG PO TABS: 4.0000 mg | ORAL_TABLET | Freq: Four times a day (QID) | ORAL | Status: DC | PRN

## 2012-05-18 MED ORDER — DEXAMETHASONE SODIUM PHOSPHATE 10 MG/ML IJ SOLN
10.0000 mg | Freq: Once | INTRAMUSCULAR | Status: AC
  Administered 2012-05-18: 10 mg via INTRAVENOUS
  Filled 2012-05-18: qty 1

## 2012-05-18 MED ORDER — DIPHENHYDRAMINE HCL 50 MG/ML IJ SOLN
25.0000 mg | Freq: Once | INTRAMUSCULAR | Status: AC
  Administered 2012-05-18: 25 mg via INTRAVENOUS
  Filled 2012-05-18: qty 1

## 2012-05-18 MED ORDER — SODIUM CHLORIDE 0.9 % IJ SOLN
3.0000 mL | Freq: Two times a day (BID) | INTRAMUSCULAR | Status: DC
  Administered 2012-05-18: 3 mL via INTRAVENOUS

## 2012-05-18 MED ORDER — ALBUTEROL 90 MCG/ACT IN AERS: 2.0000 | INHALATION_SPRAY | RESPIRATORY_TRACT | Status: DC | PRN

## 2012-05-18 MED ORDER — LISINOPRIL 5 MG PO TABS
5.0000 mg | ORAL_TABLET | Freq: Every day | ORAL | Status: DC
  Administered 2012-05-18 – 2012-05-19 (×2): 5 mg via ORAL
  Filled 2012-05-18 (×3): qty 1

## 2012-05-18 MED ORDER — IOHEXOL 350 MG/ML SOLN
80.0000 mL | Freq: Once | INTRAVENOUS | Status: AC | PRN
  Administered 2012-05-18: 80 mL via INTRAVENOUS

## 2012-05-18 MED ORDER — INSULIN ASPART 100 UNIT/ML ~~LOC~~ SOLN
0.0000 [IU] | Freq: Three times a day (TID) | SUBCUTANEOUS | Status: DC
  Administered 2012-05-19: 5 [IU] via SUBCUTANEOUS

## 2012-05-18 MED ORDER — ONDANSETRON HCL 4 MG/2ML IJ SOLN: 4.0000 mg | Freq: Four times a day (QID) | INTRAMUSCULAR | Status: DC | PRN

## 2012-05-18 MED ORDER — ASPIRIN EC 81 MG PO TBEC
81.0000 mg | DELAYED_RELEASE_TABLET | Freq: Every day | ORAL | Status: DC
  Filled 2012-05-18: qty 1

## 2012-05-18 MED ORDER — SODIUM CHLORIDE 0.9 % IV SOLN: 1000.0000 mL | INTRAVENOUS | Status: DC

## 2012-05-18 MED ORDER — ENOXAPARIN SODIUM 80 MG/0.8ML ~~LOC~~ SOLN
80.0000 mg | Freq: Two times a day (BID) | SUBCUTANEOUS | Status: DC
  Administered 2012-05-18: 80 mg via SUBCUTANEOUS
  Filled 2012-05-18: qty 0.8

## 2012-05-18 MED ORDER — INSULIN ASPART 100 UNIT/ML ~~LOC~~ SOLN
4.0000 [IU] | Freq: Three times a day (TID) | SUBCUTANEOUS | Status: DC
  Administered 2012-05-19: 4 [IU] via SUBCUTANEOUS

## 2012-05-18 MED ORDER — INSULIN GLARGINE 100 UNIT/ML ~~LOC~~ SOLN: 10.0000 [IU] | Freq: Every day | SUBCUTANEOUS | Status: DC

## 2012-05-18 MED ORDER — RIVAROXABAN 15 MG PO TABS
15.0000 mg | ORAL_TABLET | Freq: Two times a day (BID) | ORAL | Status: DC
  Administered 2012-05-19: 15 mg via ORAL
  Filled 2012-05-18 (×3): qty 1

## 2012-05-18 MED ORDER — SODIUM CHLORIDE 0.9 % IJ SOLN: 3.0000 mL | INTRAMUSCULAR | Status: DC | PRN

## 2012-05-18 NOTE — ED Notes (Signed)
Patient arrived ED through [facility] from Home.  Patient complained of chest pain for two days and a headache.  Patient advised they implanted a monitor two years ago.  Cannot advise what kind of monitor she had implanted.  Scar is visible on the mid chest.  BP-150/100, gave 1 nitro and came down to 150/90.  HR 100, NSR, RR 16-18.

## 2012-05-18 NOTE — ED Notes (Signed)
Pt back from CT.  VS WNL.

## 2012-05-18 NOTE — ED Notes (Signed)
Gave old and new ECG to Dr. Ignacia Palma after I performed. 4:24pm JG.

## 2012-05-18 NOTE — Progress Notes (Signed)
ANTICOAGULATION CONSULT NOTE - Initial Consult  Pharmacy Consult for lovenox Indication: pulmonary embolus  Allergies  Allergen Reactions  . Codeine     Fever   . Darvocet (Propoxyphene-Acetaminophen)     vomiting  . Hydrocodone-Acetaminophen     Vomiting   . Latex     rash  . Oxycodone-Acetaminophen     vomiting  . Percocet (Oxycodone-Acetaminophen)     vomiting    Patient Measurements: Weight: 167 lb (75.751 kg)   Vital Signs: Temp: 98.7 F (37.1 C) (06/08 1856) Temp src: Oral (06/08 1856) BP: 126/78 mmHg (06/08 1951) Pulse Rate: 78  (06/08 1952)  Labs:  Basename 05/18/12 1657  HGB 13.1  HCT 38.7  PLT 310  APTT --  LABPROT --  INR --  HEPARINUNFRC --  CREATININE 0.70  CKTOTAL --  CKMB --  TROPONINI --    The CrCl is unknown because both a height and weight (above a minimum accepted value) are required for this calculation.   Medical History: Past Medical History  Diagnosis Date  . PE (pulmonary embolism)     2009 - on oral contraception  . Syncopal episodes     unknown etiology  . DM (diabetes mellitus)   . Dysfunctional uterine bleeding     Seen by Dr. Pennie Rushing, GYN  . Asthma   . OSA (obstructive sleep apnea)   . Major depression     Hx suicide attempt in 2009, [facility] admissions  . Syncope and collapse     unknow etiology extensive w/u cardiology  . Obesity   . Mastodynia   . Hypertension     Medications:  Patient reports stopping coumadin back in 2010  Assessment: 41 year old female with history of PE in 2009, readmitted today for chest pain, CT revealing nonocclusive new pulmonary embolus. Received orders from EDP to start sq lovenox. Will need to order baseline coags in anticipation of starting oral anticoagulation soon. Renal function okay for bid dosing  Patient every emotion at the time of consult and could only tell me that she was she in the internal medicine clinic downstairs and took alternating 1 and 1.5 tablets but  unfortunately she does not remember the dose.  Goal of Therapy:  Anti-Xa level 0.6-1.2 units/ml 4hrs after LMWH dose given Monitor platelets by anticoagulation protocol: Yes   Plan:  Lovenox 80mg  sq q12 hours  Baseline pt/inr Cbc q 72h Follow for orders for oral anticoagulant  Severiano Gilbert 05/18/2012,8:31 PM

## 2012-05-18 NOTE — H&P (Signed)
Family Medicine Teaching [facility] Admission History and Physical  Patient name: Makayla Frazier Medical record number: [id number] Date of birth: [birth year] Age: 41 y.o. Gender: female  Primary Care Provider: Sherrie George, PA-C  Chief Complaint: headache, chest pain and vomiting History of Present Illness: Makayla Frazier is a 41 y.o. year old female with h/o PE, T2DM on insulin, HTN presenting with a 1 day history of chest pain, headache and vomiting.  She was in her usual state of health until yesterday around lunchtime when she developed some right arm numbness.  This radiated in her right neck and caused some neck spasms and stiffness.  She then developed a headache and some chest pain.  The arm and neck symptoms resolved within a few minutes after taking tramadol.  The headache and chest pain persisted.  Today, she had some vomiting, which prompted her to call EMS and come to the ED.  She received SL nitro x1 in EMS which completely resolved her headache and chest pain.    Prior PEs in 12/2007 were thought to be secondary to OCPs and her job as a Midwife although her LE dopplers were negative for DVTs that admission.  Not currently on estrogen containing birth control.  No recent surgery, immobilization, long trips.  Possible family history of PE in paternal grandfather.   In the ED, a POC trop and EKG where negative.  A CT angio of the chest showed recurrent small PE.  She received lovenox x1 in the ED.  Patient Active Problem List  Diagnoses  . DIABETES MELLITUS, TYPE II  . ANEMIA  . NEUROPATHY  . HYPERTENSION  . ASTHMA  . PRURITUS  . Syncope and collapse  . Knee pain, left  . Patellar tendinitis of left knee  . Pulmonary embolism   Past Medical History: Past Medical History  Diagnosis Date  . PE (pulmonary embolism)     2009 - on oral contraception  . Syncopal episodes     unknown etiology  . DM (diabetes mellitus)   . Dysfunctional uterine bleeding    Seen by Dr. Pennie Rushing, GYN  . Asthma   . OSA (obstructive sleep apnea)   . Major depression     Hx suicide attempt in 2009, [facility] admissions  . Syncope and collapse     unknow etiology extensive w/u cardiology  . Obesity   . Mastodynia   . Hypertension     Past Surgical History: Past Surgical History  Procedure Date  . Breast reduction surgery     Dr. Kathie Dike [facility] 2011  . Cholecystectomy   . Cardiac electrophysiology study & dft     Implantable Loop recorder    Social History: History   Social History  . Marital Status: Single    Spouse Name: N/A    Number of Children: N/A  . Years of Education: N/A   Occupational History  . 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 Smoking status: Never Smoker   . Smokeless tobacco: None  . Alcohol Use: No  . Drug Use: No  . Sexually Active: Yes    Birth Control/ Protection: IUD   Other Topics Concern  . None   Social History Narrative   [place] significant other, WilliamCompleted 10th grade.Worker Compensation Case 2009-2012 related to PE    Family History: Family History  Problem Relation Age of Onset  . Melanoma Father 55  .  Ulcerative colitis Mother 89  . Breast cancer Paternal Grandmother   . Diabetes type II Maternal Grandmother     deceased 78  . Pulmonary embolism Paternal Grandfather     Allergies: Allergies  Allergen Reactions  . Codeine     Fever   . Darvocet (Propoxyphene-Acetaminophen)     vomiting  . Hydrocodone-Acetaminophen     Vomiting   . Latex     rash  . Oxycodone-Acetaminophen     vomiting  . Percocet (Oxycodone-Acetaminophen)     vomiting    Current Facility-Administered Medications  Medication Dose Route Frequency Provider Last Rate Last Dose  . 0.9 %  sodium chloride infusion   Intravenous Continuous Carleene Cooper III, MD      . 0.9 %  sodium chloride infusion  250 mL Intravenous PRN Jonia Oakey Tye Savoy, MD      . albuterol  (PROVENTIL HFA;VENTOLIN HFA) 108 (90 BASE) MCG/ACT inhaler 2 puff  2 puff Inhalation Q4H PRN Leighton Roach McDiarmid, MD      . aspirin EC tablet 81 mg  81 mg Oral Daily Rosenda Geffrard de Lawson Radar, MD      . dexamethasone (DECADRON) injection 10 mg  10 mg Intravenous Once Carleene Cooper III, MD   10 mg at 05/18/12 1655  . diphenhydrAMINE (BENADRYL) injection 25 mg  25 mg Intravenous Once Carleene Cooper III, MD   25 mg at 05/18/12 1653  . enoxaparin (LOVENOX) injection 80 mg  80 mg Subcutaneous BID Severiano Gilbert, PHARMD   80 mg at 05/18/12 2041  . ergocalciferol (VITAMIN D2) capsule 50,000 Units  50,000 Units Oral 2 times weekly Azrielle Springsteen de Lawson Radar, MD      . ibuprofen (ADVIL,MOTRIN) tablet 600 mg  600 mg Oral Q6H PRN Reneisha Stilley Tye Savoy, MD      . insulin aspart (novoLOG) injection 0-15 Units  0-15 Units Subcutaneous TID WC Kemora Pinard de Lawson Radar, MD      . insulin aspart (novoLOG) injection 0-5 Units  0-5 Units Subcutaneous QHS Lilja Soland de Lawson Radar, MD      . insulin aspart (novoLOG) injection 4 Units  4 Units Subcutaneous TID WC Nymir Ringler Tye Savoy, MD      . insulin glargine (LANTUS) injection 25 Units  25 Units Subcutaneous QHS Kayhan Boardley de Lawson Radar, MD      . iohexol (OMNIPAQUE) 350 MG/ML injection 80 mL  80 mL Intravenous Once PRN Medication Radiologist, MD   80 mL at 05/18/12 1954  . lisinopril (PRINIVIL,ZESTRIL) tablet 5 mg  5 mg Oral Daily Deago Burruss de Lawson Radar, MD      . metoCLOPramide [facility]) injection 10 mg  10 mg Intravenous Once Carleene Cooper III, MD   10 mg at 05/18/12 1651  . ondansetron (ZOFRAN) tablet 4 mg  4 mg Oral Q6H PRN Stephinie Battisti Tye Savoy, MD       Or  . ondansetron (ZOFRAN) injection 4 mg  4 mg Intravenous Q6H PRN Stefanie Hodgens Tye Savoy, MD       Review Of Systems: Per HPI with the following additions: none Otherwise 12 point review of systems was performed and was unremarkable.  Physical Exam: Pulse: 94  Blood Pressure: 147/97 RR: 18   O2: 98 on RA Temp: 98.1  General: alert, cooperative, appears stated age, no distress and morbidly  obese HEENT: extra ocular movement intact and sclera clear, anicteric Heart: S1, S2 normal, no murmur, rub or gallop, regular rate and rhythm Lungs: clear to auscultation,  no wheezes or rales and unlabored breathing Abdomen: abdomen is soft without significant tenderness, masses, organomegaly or guarding Extremities: extremities normal, atraumatic, no cyanosis or edema, Homans sign is negative, no sign of DVT and no edema, redness or tenderness in the calves or thighs Skin:no rashes, no ecchymoses Neurology: normal without focal findings and mental status, speech normal, alert and oriented x3  Labs and Imaging:  Lab 05/18/12 1657  WBC 10.9*  HGB 13.1  HCT 38.7  PLT 310    Lab 05/18/12 1657  NA 134*  K 3.8  CL 99  CO2 25  BUN 8  CREATININE 0.70  CALCIUM 9.5  PROT 7.6  BILITOT 0.2*  ALKPHOS 63  ALT 19  AST 15  GLUCOSE 358*   D-dimer; 1.02 POC trop: 0.00  UA: spec grav 1.043, >1000 glucose Upreg: negative  EKG: nsr, no ST changes  CXR: No active disease. No significant change.  CT Head: No acute intracranial abnormality. No significant change.  CT Neck: No acute fracture or subluxation. Mild degenerative changes as  described above.  CT angio chest: Nonocclusive pulmonary embolism within a subsegmental branch of the  left lower lobe pulmonary artery. Overall clot burden is very  small.  Assessment and Plan: Makayla Frazier is a 41 y.o. year old female with HTN, IDDM, and h/o of PE presenting with recurrent PE.  # Pulmonary embolism: This is second occurrence of PE.  No triggers identified.  No evidence for DVT on exam.  Was given lovenox in the ED; however, as she has insurance, she would be a good candidate for xarelto as she will need lifelong anti-coagulation at this point. Could consider hypercoaguable work up, but this will not change management at this time. - will check INR and start xarelto if < 3 at 15mg  BID  # Chest pain: Likely related to recurrent  PE.  POC troponin negative in ED, EKG with no ST changes, and she does not complain of any additional chest pain. - will cycle CE x 2 - Motrin PRN pain  # Headache: Resolved.  May have been a migraine with history of right arm numbness. - will monitor  # T2DM: Poorly controlled with last A1c in 10/12 at 8.9.   - will continue home Lantus (25 units at bedtime) - will give meal coverage of 4 units + mod SSI - will check A1c  # Hypertension: Well controlled. - continue home lisinopril  # Asthma: Stable. - continue albuterol prn  FEN/GI: carb diet, NS at 160cc/hr x12 hours Prophylaxis: lovenox --> will change to xarelto Disposition: admit to telemetry  BOOTH, ERIN 05/18/2012, 10:16 PM    Patient seen and examined with Dr. Elwyn Reach.  Available data reviewed. Agree with findings, assessment, and plan as outlined by Dr. Elwyn Reach.  My additional findings are documented below.  Briefly, this is a 41 year old female with hx of Pulmonary embolism in 2009 secondary to OCP and prolonged immobility.  She presented with CP, headache, nausea/vomiting x 1 day.  This resolved in ED after Nitro SL and migraine cocktail.  Physical Exam: General: alert, cooperative, morbidly obese Heart: S1, S2 normal, no murmur, rub or gallop, regular rate and rhythm Lungs: clear to auscultation, no wheezes or rales and unlabored breathing Abdomen: abdomen is soft without significant tenderness Extremities: extremities normal, atraumatic, no cyanosis or edema, Homans sign is negative, no sign of DVT and no edema, redness or tenderness in the calves or thighs Skin:no rashes, no ecchymoses Neurology: grossly normal  A/P: as above 1. Pulmonary embolism: Admit for observation and monitor for CP, nausea/vomiting, HA.  Patient received first dose Lovenox in ED.  INR < 3, so we stopped Lovenox and started Xarelto.   2. DM, insulin dependent: CBG elevated 358.  Will start Lantus 10 and SSI.  Check A1c. 3. Chest pain: Troponin  POC negative. Will cycle CE x 3.  EKG NSR. 4. Disposition: Anticipate D/C home tomorrow pending continued clinical improvement.   Kilani Joffe de [employer], DO 05/18/2012 10:31 PM

## 2012-05-18 NOTE — ED Notes (Signed)
Patient transported to CT 

## 2012-05-18 NOTE — ED Provider Notes (Signed)
History     CSN: [id number]  Arrival date & time 05/18/12  1613   First MD Initiated Contact with Patient 05/18/12 1617      Chief Complaint  Patient presents with  . Chest Pain  . Headache    (Consider location/radiation/quality/duration/timing/severity/associated sxs/prior treatment) HPI Comments: The patient is a 41 year old woman who is diabetic, on insulin. She says that she has had chest pain and headache since yesterday. Her right arm became numb the pain went up into her neck and head. The pain in her neck and head with sharp. There was no injury. She has had prior episodes of chest pain and in the in for a cardiac monitor, which never showed any specific cause for her chest pain. She had some vomiting today. She called EMS and was given nitroglycerin, and this helped her blood pressure come down somewhat.  Patient is a 41 y.o. female presenting with headaches and chest pain.  Headache  Associated symptoms include nausea and vomiting. Pertinent negatives include no fever.  Chest Pain The chest pain began yesterday. Episode Length: Episodes of chest pain lasting 10-15 minutes at a time. Chest pain occurs intermittently. The chest pain is unchanged. At its most intense, the pain is at 8/10. The quality of the pain is described as sharp. The pain radiates to the right arm. Exacerbated by: Nothing. Primary symptoms include nausea and vomiting. Pertinent negatives for primary symptoms include no fever and no cough. Associated symptoms comments: Right temporal headache.. She tried nitroglycerin and aspirin for the symptoms. Risk factors include obesity.  Her past medical history is significant for diabetes and hypertension.     Past Medical History  Diagnosis Date  . PE (pulmonary embolism)     2009 - on oral contraception  . Syncopal episodes     unknown etiology  . DM (diabetes mellitus)   . Dysfunctional uterine bleeding     Seen by Dr. Pennie Rushing, GYN  . Asthma   . OSA  (obstructive sleep apnea)   . Major depression     Hx suicide attempt in 2009, [facility] admissions  . Syncope and collapse     unknow etiology extensive w/u cardiology  . Obesity   . Mastodynia   . Hypertension     Past Surgical History  Procedure Date  . Breast reduction surgery     Dr. Kathie Dike [facility] 2011  . Cholecystectomy   . Cardiac electrophysiology study & dft     Implantable Loop recorder    Family History  Problem Relation Age of Onset  . Melanoma Father 71  . Ulcerative colitis Mother 40  . Breast cancer Paternal Grandmother   . Diabetes type II Maternal Grandmother     deceased 2  . Pulmonary embolism Paternal Grandfather     History  Substance Use Topics  . Smoking status: Never Smoker   . Smokeless tobacco: Not on file  . Alcohol Use: No    OB History    Grav Para Term Preterm Abortions TAB SAB Ect Mult Living                  Review of Systems  Constitutional: Negative.  Negative for fever and chills.  Eyes: Negative.   Respiratory: Negative.  Negative for cough.   Cardiovascular: Positive for chest pain.  Gastrointestinal: Positive for nausea and vomiting.  Genitourinary: Negative.   Musculoskeletal: Negative.   Skin: Negative.   Neurological: Positive for headaches.  Psychiatric/Behavioral: Negative.     Allergies  Darvocet; Hydrocodone-acetaminophen; Latex; Oxycodone-acetaminophen; and Percocet  Home Medications   Current Outpatient Rx  Name Route Sig Dispense Refill  . ALBUTEROL 90 MCG/ACT IN AERS  1-2 puffs every 4-6 hours as needed for wheezing or shortness of breath     . ASPIRIN 81 MG PO TBEC Oral Take 81 mg by mouth daily.      . IBUPROFEN 400 MG PO TABS Oral Take 400 mg by mouth every 6 (six) hours as needed. For pain relief    . INSULIN GLARGINE 100 UNIT/ML [place] SOLN Subcutaneous Inject 10 Units into the skin at bedtime.    . INSULIN PEN NEEDLE 31G X 5 MM MISC  Use to inject Victoza once a day at the same time 31 each 3  .  LISINOPRIL 5 MG PO TABS Oral Take 1 tablet (5 mg total) by mouth daily. 31 tablet 3    BP 125/78  Pulse 93  Temp(Src) 98.7 F (37.1 C) (Oral)  Resp 16  SpO2 100%  Physical Exam  Nursing note and vitals reviewed. Constitutional: She is oriented to person, place, and time. She appears well-developed and well-nourished. No distress.  HENT:  Head: Normocephalic and atraumatic.  Right Ear: External ear normal.  Left Ear: External ear normal.  Eyes: Conjunctivae and EOM are normal. Pupils are equal, round, and reactive to light.  Neck: Normal range of motion. Neck supple. No JVD present.  Cardiovascular: Normal rate, regular rhythm and normal heart sounds.   Pulmonary/Chest: Effort normal and breath sounds normal.  Abdominal: Soft. Bowel sounds are normal.  Musculoskeletal: Normal range of motion. She exhibits no edema and no tenderness.  Lymphadenopathy:    She has no cervical adenopathy.  Neurological: She is alert and oriented to person, place, and time.       No sensory or motor deficit.   Skin: Skin is warm and dry. She is not diaphoretic.  Psychiatric: She has a normal mood and affect. Her behavior is normal.    ED Course  Procedures (including critical care time)  4:25 PM  Date: 05/18/2012  Rate: 94  Rhythm: normal sinus rhythm  QRS Axis: normal  Intervals: normal  ST/T Wave abnormalities: nonspecific T wave changes  Conduction Disutrbances:none  Narrative Interpretation: Borderline EK G  Old EKG Reviewed: none available  4:57 PM Pt seen --> physical exam performed.  Lab workup ordered.  IV medications for headache ordered.  6:52 PM Results for orders placed during the hospital encounter of 05/18/12  CBC      Component Value Range   WBC 10.9 (*) 4.0 - 10.5 (K/uL)   RBC 4.97  3.87 - 5.11 (MIL/uL)   Hemoglobin 13.1  12.0 - 15.0 (g/dL)   HCT 40.9  81.1 - 91.4 (%)   MCV 77.9 (*) 78.0 - 100.0 (fL)   MCH 26.4  26.0 - 34.0 (pg)   MCHC 33.9  30.0 - 36.0 (g/dL)    RDW 78.2  95.6 - 21.3 (%)   Platelets 310  150 - 400 (K/uL)  DIFFERENTIAL      Component Value Range   Neutrophils Relative 62  43 - 77 (%)   Neutro Abs 6.7  1.7 - 7.7 (K/uL)   Lymphocytes Relative 32  12 - 46 (%)   Lymphs Abs 3.5  0.7 - 4.0 (K/uL)   Monocytes Relative 5  3 - 12 (%)   Monocytes Absolute 0.6  0.1 - 1.0 (K/uL)   Eosinophils Relative 1  0 - 5 (%)  Eosinophils Absolute 0.1  0.0 - 0.7 (K/uL)   Basophils Relative 0  0 - 1 (%)   Basophils Absolute 0.0  0.0 - 0.1 (K/uL)  COMPREHENSIVE METABOLIC PANEL      Component Value Range   Sodium 134 (*) 135 - 145 (mEq/L)   Potassium 3.8  3.5 - 5.1 (mEq/L)   Chloride 99  96 - 112 (mEq/L)   CO2 25  19 - 32 (mEq/L)   Glucose, Bld 358 (*) 70 - 99 (mg/dL)   BUN 8  6 - 23 (mg/dL)   Creatinine, Ser 1.61  0.50 - 1.10 (mg/dL)   Calcium 9.5  8.4 - 09.6 (mg/dL)   Total Protein 7.6  6.0 - 8.3 (g/dL)   Albumin 3.5  3.5 - 5.2 (g/dL)   AST 15  0 - 37 (U/L)   ALT 19  0 - 35 (U/L)   Alkaline Phosphatase 63  39 - 117 (U/L)   Total Bilirubin 0.2 (*) 0.3 - 1.2 (mg/dL)   GFR calc non Af Amer >90  >90 (mL/min)   GFR calc Af Amer >90  >90 (mL/min)  URINALYSIS, ROUTINE W REFLEX MICROSCOPIC      Component Value Range   Color, Urine YELLOW  YELLOW    APPearance CLOUDY (*) CLEAR    Specific Gravity, Urine 1.043 (*) 1.005 - 1.030    pH 5.5  5.0 - 8.0    Glucose, UA >1000 (*) NEGATIVE (mg/dL)   Hgb urine dipstick NEGATIVE  NEGATIVE    Bilirubin Urine NEGATIVE  NEGATIVE    Ketones, ur NEGATIVE  NEGATIVE (mg/dL)   Protein, ur NEGATIVE  NEGATIVE (mg/dL)   Urobilinogen, UA 0.2  0.0 - 1.0 (mg/dL)   Nitrite NEGATIVE  NEGATIVE    Leukocytes, UA NEGATIVE  NEGATIVE   D-DIMER, QUANTITATIVE      Component Value Range   D-Dimer, Quant 1.02 (*) 0.00 - 0.48 (ug/mL-FEU)  POCT I-STAT TROPONIN I      Component Value Range   Troponin i, poc 0.00  0.00 - 0.08 (ng/mL)   Comment 3           PREGNANCY, URINE      Component Value Range   Preg Test, Ur NEGATIVE   NEGATIVE   URINE MICROSCOPIC-ADD ON      Component Value Range   Squamous Epithelial / LPF MANY (*) RARE    WBC, UA 0-2  <3 (WBC/hpf)   Bacteria, UA FEW (*) RARE    Dg Chest 2 View  05/18/2012  *RADIOLOGY REPORT*  Clinical Data: Chest pain, headache  CHEST - 2 VIEW  Comparison: 03/18/2012  Findings: Cardiomediastinal silhouette is stable.  No acute infiltrate or pleural effusion.  No pulmonary edema.  A cardiac monitor of the left chest again noted.  IMPRESSION: No active disease.  No significant change.  Original Report Authenticated By: Natasha Mead, M.D.   Ct Head Wo Contrast  05/18/2012  *RADIOLOGY REPORT*  Clinical Data:  Chest pain, headache, right arm numbness  CT HEAD WITHOUT CONTRAST CT CERVICAL SPINE WITHOUT CONTRAST  Technique:  Multidetector CT imaging of the head and cervical spine was performed following the standard protocol without intravenous contrast.  Multiplanar CT image reconstructions of the cervical spine were also generated.  Comparison:  CT scan 02/15/2009  CT HEAD  Findings: No skull fracture is noted.  No intracranial hemorrhage, mass effect or midline shift.  Stable cerebral atrophy.  Paranasal sinuses and mastoid air cells are unremarkable.  No acute infarction.  No mass lesion is noted on this unenhanced scan.  IMPRESSION: No acute intracranial abnormality.  No significant change.  CT CERVICAL SPINE  Findings: Axial images of the cervical spine shows no acute fracture or subluxation there is no pneumothorax in visualized lung apices.  There is reversal of the cervical lordosis.  Mild anterior spurring noted lower endplate of C2 vertebral body.  Moderate anterior spurring noted lower endplate of C5 vertebral body.  Mild posterior spurring at C5-C6 and C6-C7 level.  No prevertebral soft tissue swelling.  Cervical airway is patent.  No significant neural foraminal narrowing noted.  IMPRESSION: No acute fracture or subluxation.  Mild degenerative changes as described above.  Original  Report Authenticated By: Natasha Mead, M.D.   Ct Cervical Spine Wo Contrast  05/18/2012  *RADIOLOGY REPORT*  Clinical Data:  Chest pain, headache, right arm numbness  CT HEAD WITHOUT CONTRAST CT CERVICAL SPINE WITHOUT CONTRAST  Technique:  Multidetector CT imaging of the head and cervical spine was performed following the standard protocol without intravenous contrast.  Multiplanar CT image reconstructions of the cervical spine were also generated.  Comparison:  CT scan 02/15/2009  CT HEAD  Findings: No skull fracture is noted.  No intracranial hemorrhage, mass effect or midline shift.  Stable cerebral atrophy.  Paranasal sinuses and mastoid air cells are unremarkable.  No acute infarction.  No mass lesion is noted on this unenhanced scan.  IMPRESSION: No acute intracranial abnormality.  No significant change.  CT CERVICAL SPINE  Findings: Axial images of the cervical spine shows no acute fracture or subluxation there is no pneumothorax in visualized lung apices.  There is reversal of the cervical lordosis.  Mild anterior spurring noted lower endplate of C2 vertebral body.  Moderate anterior spurring noted lower endplate of C5 vertebral body.  Mild posterior spurring at C5-C6 and C6-C7 level.  No prevertebral soft tissue swelling.  Cervical airway is patent.  No significant neural foraminal narrowing noted.  IMPRESSION: No acute fracture or subluxation.  Mild degenerative changes as described above.  Original Report Authenticated By: Natasha Mead, M.D.    6:53 PM D dimer was elevated; will get CT angio of chest to check for PE.  CT of head and neck negative, chest x-ray negative, cardiac markers negative.  Results reported to pt.    8:13 PM CT angio of chest shows a small PE.  Call to unassigned medicine to admit her.  8:22 PM [facility] Teaching Service will admit pt; Dr. Perley Jain is attending.  Rx with Lovenox as dosed by Pharmacy.  1. Pulmonary embolism            Carleene Cooper III,  MD 05/18/12 2023

## 2012-05-19 ENCOUNTER — Encounter (HOSPITAL_COMMUNITY): Payer: Self-pay

## 2012-05-19 DIAGNOSIS — I2699 Other pulmonary embolism without acute cor pulmonale: Secondary | ICD-10-CM

## 2012-05-19 LAB — CBC
HCT: 37.1 % (ref 36.0–46.0)
Platelets: 297 10*3/uL (ref 150–400)
RDW: 14.5 % (ref 11.5–15.5)
WBC: 13.8 10*3/uL — ABNORMAL HIGH (ref 4.0–10.5)

## 2012-05-19 LAB — GLUCOSE, CAPILLARY: Glucose-Capillary: 233 mg/dL — ABNORMAL HIGH (ref 70–99)

## 2012-05-19 LAB — HEMOGLOBIN A1C
Hgb A1c MFr Bld: 11.3 % — ABNORMAL HIGH (ref <5.7)
Mean Plasma Glucose: 278 mg/dL — ABNORMAL HIGH (ref <117)

## 2012-05-19 LAB — CARDIAC PANEL(CRET KIN+CKTOT+MB+TROPI): CK, MB: 1.1 ng/mL (ref 0.3–4.0)

## 2012-05-19 LAB — BASIC METABOLIC PANEL
Chloride: 101 mEq/L (ref 96–112)
Creatinine, Ser: 0.61 mg/dL (ref 0.50–1.10)
GFR calc Af Amer: >90 mL/min (ref >90)

## 2012-05-19 MED ORDER — RIVAROXABAN 20 MG PO TABS: 20.0000 mg | ORAL_TABLET | Freq: Every day | ORAL | Status: DC

## 2012-05-19 MED ORDER — RIVAROXABAN 15 MG PO TABS: 15.0000 mg | ORAL_TABLET | Freq: Two times a day (BID) | ORAL | Status: DC

## 2012-05-19 MED ORDER — TRAMADOL HCL 50 MG PO TABS: 50.0000 mg | ORAL_TABLET | Freq: Four times a day (QID) | ORAL | Status: AC | PRN

## 2012-05-19 MED ORDER — RIVAROXABAN 15 MG PO TABS: 15.0000 mg | ORAL_TABLET | Freq: Every day | ORAL | Status: DC

## 2012-05-19 MED ORDER — IBUPROFEN 600 MG PO TABS: 600.0000 mg | ORAL_TABLET | Freq: Four times a day (QID) | ORAL | Status: DC | PRN

## 2012-05-19 NOTE — Discharge Summary (Signed)
I have seen and examined this patient. I have discussed with Dr Mardella Layman.  I agree with their findings and plans as documented in their discharge note.

## 2012-05-19 NOTE — Progress Notes (Signed)
Subjective: Patient doing well this morning.  Says she felt mild chest pain at LSB around 3 AM.  She was given Motrin and pain resolved.  Denies any SOB.  Denies any HA, nausea or vomiting.    Objective: Vital signs in last 24 hours: Temp:  [97.9 F (36.6 C)-98.7 F (37.1 C)] 97.9 F (36.6 C) (06/09 0444) Pulse Rate:  [73-94] 77  (06/09 0444) Resp:  [11-18] 18  (06/09 0444) BP: (97-147)/(66-97) 97/66 mmHg (06/09 0444) SpO2:  [98 %-100 %] 99 % (06/09 0444) Weight:  [167 lb (75.751 kg)-170 lb 9.6 oz (77.384 kg)] 170 lb 9.6 oz (77.384 kg) (06/08 2124) Weight change:  Last BM Date: 05/18/12   Physical Exam: General appearance: alert, cooperative and morbidly obese Resp: clear to auscultation bilaterally Cardio: regular rate and rhythm, S1, S2 normal, no murmur, click, rub or gallop GI: soft, non-tender; bowel sounds normal; no masses,  no organomegaly Extremities: extremities normal, atraumatic, no cyanosis or edema Skin: Skin color, texture, turgor normal. No rashes or lesions  Lab Results:  Basename 05/19/12 0505 05/18/12 1657  WBC 13.8* 10.9*  HGB 12.4 13.1  HCT 37.1 38.7  PLT 297 310   BMET  Basename 05/19/12 0505 05/18/12 1657  NA 134* 134*  K 3.8 3.8  CL 101 99  CO2 23 25  GLUCOSE 271* 358*  BUN 8 8  CREATININE 0.61 0.70  CALCIUM 9.0 9.5    Studies/Results: Dg Chest 2 View  05/18/2012  *RADIOLOGY REPORT*  Clinical Data: Chest pain, headache  CHEST - 2 VIEW  IMPRESSION: No active disease.  No significant change.   Ct Head Wo Contrast  05/18/2012  *RADIOLOGY REPORT*  Clinical Data:  Chest pain, headache, right arm numbness  CT HEAD WITHOUT CONTRAST CT CERVICAL SPINE WITHOUT CONTRAST   IMPRESSION: No acute fracture or subluxation.  Mild degenerative changes as described above.   Ct Angio Chest W/cm &/or Wo Cm  05/18/2012  *RADIOLOGY REPORT*  Clinical Data: Chest pain, positive D-dimer, history of bilateral PE  CT ANGIOGRAPHY CHEST  MPRESSION: Nonocclusive pulmonary  embolism within a subsegmental branch of the left lower lobe pulmonary artery.  Overall clot burden is very small.  Critical Value/emergent results were called by telephone at the time of interpretation on 05/18/2012  at 2005 hours  to  Dr. Carleene Cooper, who verbally acknowledged these results.   Ct Cervical Spine Wo Contrast  05/18/2012  *RADIOLOGY REPORT*  Clinical Data:  Chest pain, headache, right arm numbness  CT HEAD WITHOUT CONTRAST CT CERVICAL SPINE WITHOUT CONTRAST  IMPRESSION: No acute fracture or subluxation.  Mild degenerative changes as described above.     Medications: I have reviewed the patient's current medications.  Assessment/Plan: Makayla Frazier is a 41 y.o. year old female with HTN, IDDM, and h/o of PE presenting with recurrent PE.  # Pulmonary embolism: This is second occurrence of PE. No triggers identified. No evidence for DVT on exam. Was given lovenox in the ED; however, as she has insurance, Lovenox discontinued and started Xarelto yesterday. - Awaiting LE dopplers today - Discussed life long anticoagulation with patient, discussed risks and benefits of Xarelto - Could consider hypercoaguable work up, but this will not change management at this time  # Leukocytosis: WBC increased from 10.9 to 13.8 likely secondary to Decadron in ED.  No identifiable source of infection.  UA was normal.  CXR no active disease.  She is afebrile. - She will need a repeat CBC at follow appointment with  PCP  # Chest pain: Likely related to recurrent PE.  Troponin negative x 2, EKG with no ST changes. - Motrin PRN pain - Rx for Tramadol PRN pain  # Headache: Resolved. May have been a migraine with history of right arm numbness.  - will monitor   # T2DM: Poorly controlled with last A1c in 10/12 at 8.9. CBG still elevated 230s. - will continue home regimen and SSI (Lantus 25 units at bedtime) - May increase to 27 units or defer to PCP - A1c pending  # Hypertension: Well controlled.    - continue home lisinopril   # Asthma: Stable.  - continue albuterol prn   FEN/GI: carb diet, NS at 160cc/hr x12 hours  Prophylaxis: lovenox --> will change to xarelto  Disposition: admit to telemetry   LOS: 1 day   DE LA Frazier,Makayla Luft 05/19/2012, 7:17 AM

## 2012-05-19 NOTE — H&P (Signed)
I have seen and examined this patient. I have discussed with Dr(s) Elwyn Reach and de [place] .  I agree with their findings and plans as documented in their admission note.  Acute Issues 1. Recurrent Pulmonary embolism, idiopathic - Patient hemodynamically stable.  - CTA-Chest showed P.E. In subsegment of LLL pulm artery.  - No overt risk factor other than prior PULMONARY EMBOLI. No symptoms of malignancy.  - VTE occurred off of anticoagulation.  _Recommendations: 1. START rivaroxaban 15 mg TWICE DAILY for 3 weeks, then 20 mg daily for minimum of 3 months.  Would encourage long-term anticoagulation in this patient with recurrent VTE with this current event being idiopathic in origin.  2. Age and Sex appropriate malignancy screening as outpatient with her PCP.

## 2012-05-19 NOTE — Discharge Instructions (Signed)
Take Xarelto 15 mg twice daily x 20 days, then take 20 mg daily life long. Schedule follow up appointment with your PCP in 5-7 days.  Pulmonary Embolus A pulmonary (lung) embolus (PE) is a blood clot that has traveled from another place in the body to the lung. Most clots come from deep veins in the legs or pelvis. PE is a dangerous and potentially life-threatening condition that can be treated if identified. CAUSES Blood clots form in a vein for different reasons. Usually several things cause blood clots. They include:  The flow of blood slows down.   The inside of the vein is damaged in some way.   The person has a condition that makes the blood clot more easily. These conditions may include:   Older age (especially over 32 years old).   Having a history of blood clots.   Having major or lengthy surgery. Hip surgery is particularly high-risk.   Breaking a hip or leg.   Sitting or lying still for a long time.   Cancer or cancer treatment.   Having a long, thin tube (catheter) placed inside a vein during a medical procedure.   Being overweight (obese).   Pregnancy and childbirth.   Medicines with estrogen.   Smoking.   Other circulation or heart problems.  SYMPTOMS  The symptoms of a PE usually start suddenly and include:  Shortness of breath.   Coughing.   Coughing up blood or blood-tinged mucus (phlegm).   Chest pain. Pain is often worse with deep breaths.   Rapid heartbeat.  DIAGNOSIS  If a PE is suspected, your caregiver will take a medical history and carry out a physical exam. Your caregiver will check for the risk factors listed above. Tests that also may be required include:  Blood tests, including studies of the clotting properties of your blood.   Imaging tests. Ultrasound, CT, MRI, and other tests can all be used to see if you have clots in your legs or lungs. If you have a clot in your legs and have breathing or chest problems, your caregiver may  conclude that you have a clot in your lungs. Further lung tests may not be needed.   An EKG can look for heart strain from blood clots in the lungs.  PREVENTION   Exercise the legs regularly. Take a brisk 30 minute walk every day.   Maintain a weight that is appropriate for your height.   Avoid sitting or lying in bed for long periods of time without moving your legs.   Women, particularly those over the age of 6, should consider the risks and benefits of taking estrogen medicines, including birth control pills.   Do not smoke, especially if you take estrogen medicines.   Long-distance travel can increase your risk. You should exercise your legs by walking or pumping the muscles every hour.   In hospital prevention:   Your caregiver will assess your need for preventive PE care (prophylaxis) when you are admitted to the hospital. If you are having surgery, your surgeon will assess you the day of or day after surgery.   Prevention may include medical and nonmedical measures.  TREATMENT   The most common treatment for a PE is blood thinning (anticoagulant) medicine, which reduces the blood's tendency to clot. Anticoagulants can stop new blood clots from forming and old ones from growing. They cannot dissolve existing clots. Your body does this by itself over time. Anticoagulants can be given by mouth, by intravenous (IV)  access, or by injection. Your caregiver will determine the best program for you.   Less commonly, clot-dissolving drugs (thrombolytics) are used to dissolve a PE. They carry a high risk of bleeding, so they are used mainly in severe cases.   Very rarely, a blood clot in the leg needs to be removed surgically.   If you are unable to take anticoagulants, your caregiver may arrange for you to have a filter placed in a main vein in your belly (abdomen). This filter prevents clots from traveling to your lungs.  HOME CARE INSTRUCTIONS   Take all medicines prescribed by your  caregiver. Follow the directions carefully.   You will most likely continue taking anticoagulants after you leave the hospital. Your caregiver will advise you on the length of treatment (usually 3 to 6 months, sometimes for life).   Taking too much or too little of an anticoagulant is dangerous. While taking this type of medicine, you will need to have regular blood tests to be sure the dose is correct. The dose can change for many reasons. It is critically important that you take this medicine exactly as prescribed and that you have blood tests exactly as directed.   Many foods can interfere with anticoagulants. These include foods high in vitamin K, such as spinach, kale, broccoli, cabbage, collard and turnip greens, Brussels sprouts, peas, cauliflower, seaweed, parsley, beef and pork liver, green tea, and soybean oil. Your caregiver should discuss limits on these foods with you or you should arrange a visit with a dietician to answer your questions.   Many medicines can interfere with anticoagulants. You must tell your caregiver about any and all medicines you take. This includesall vitamins and supplements. Be especially cautious with aspirin and anti-inflammatory medicines. Ask your caregiver before taking these.   Anticoagulants can have side effects, mostly excessive bruising or bleeding. You will need to hold pressure over cuts for longer than usual. Avoid alcoholic drinks or consume only very small amounts while taking this medicine.   If you are taking an anticoagulant:   Wear a medical alert bracelet.   Notify your dentist or other caregivers before procedures.   Avoid contact sports.   Ask your caregiver how soon you can go back to normal activities. Not being active can lead to new clots. Ask for a list of what you should and should not do.   Exercise your lower leg muscles. This is important while traveling.   You may need to wear compression stockings. These are tight elastic  stockings that apply pressure to the lower legs. This can help keep the blood in the legs from clotting.   If you are a smoker, you should quit.   Learn as much as you can about pulmonary embolisms.  SEEK MEDICAL CARE IF:   You notice a rapid heartbeat.   You feel weaker or more tired than usual.   You feel faint.   You notice increased bruising.   Your symptoms are not getting better in the time expected.   You are having side effects of medicine.   You have an oral temperature above 102 F (38.9 C).   You discover other family members with blood clots. This may require further testing for inherited diseases or conditions.  SEEK IMMEDIATE MEDICAL CARE IF:   You have chest pain.   You have trouble breathing.   You have new or increased swelling or pain in one leg.   You cough up blood.   You  notice blood in vomit, in a bowel movement, or in urine.   You have an oral temperature above 102 F (38.9 C), not controlled by medicine.  You may have another PE. A blood clot in the lungs is a medical emergency. Call your local emergency services (911 in [place] to get to the nearest hospital or clinic. Do not drive yourself. MAKE SURE YOU:   Understand these instructions.   Will watch your condition.   Will get help right away if you are not doing well or get worse.  Document Released: 11/24/2000 Document Revised: 11/16/2011 Document Reviewed: 05/31/2009 [facility] Patient Information 2012 [place], [place].

## 2012-05-19 NOTE — Progress Notes (Signed)
I have seen and examined this patient. I have discussed with Dr Mardella Layman.  I agree with their findings and plans as documented in their progress note. Please see my brief H&P for details.

## 2012-05-19 NOTE — Progress Notes (Signed)
Dc'd patient home with mother.  RN reviewed DC instructions and med rec with pt and mother.  Pt and mother verbalized understanding of follow up appointments with MD.  RN dc'd IV with catheter intact. Lodema [facility]

## 2012-05-19 NOTE — Discharge Summary (Signed)
Physician Discharge Summary  Patient ID: Makayla Frazier MRN: [id number] DOB/AGE: [birth year] 40 y.o.  Admit date: 05/18/2012 Discharge date: 05/19/2012  Admission Diagnoses: Pulmonary Embolism DM, Type 2 uncontrolled  Discharge Diagnoses:  Pulmonary embolism DM, Type 2 Asthma  Discharged Condition: stable  Hospital Course:  Makayla Frazier is a 41 y.o. year old female with HTN, IDDM, and h/o of PE presenting with recurrent pulmonary embolism.   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  Was given lovenox in the ED; however, as she has insurance, Lovenox discontinued and started Xarelto yesterday.  No evidence for DVT on exam.  We ordered LE Dopplers which are pending.  Discussed life long anticoagulation with patient, discussed risks and benefits of Xarelto.  In the future, could consider hypercoaguable work up, but this will not change management at this time.  2) Leukocytosis: WBC increased from 10.9 to 13.8 likely secondary to Decadron in ED.  No identifiable source of infection.  UA was normal.  CXR no active disease.  She is afebrile.  She will need a repeat CBC at follow appointment with PCP  3) Chest pain: Likely related to recurrent PE.  Troponin negative x 2, EKG with no ST changes.  Given Rx for Tramadol PRN pain.  4) Headache: Resolved with Migraine Cocktail in ED.  May have been a migraine with history of right arm numbness.  Patient to follow up with PCP if headaches persist.  5) T2DM: Poorly controlled with last A1c in 10/12 at 8.9. CBG still elevated 230s.  Patient admitted to not taking insulin everyday. Continued home regimen and SSI (Lantus 25 units at bedtime).  A1c pending.  Will defer DM management to PCP as non-compliance seems to be an issue.  6) Hypertension: Well controlled on home lisinopril 5 mg.  7) Asthma: Stable.    Consults: None  Significant Diagnostic Studies:   Lab Results:    Basename  05/19/12 0505  05/18/12 1657   WBC  13.8*  10.9*   HGB  12.4  13.1   HCT  37.1  38.7   PLT  297  310    BMET   Basename  05/19/12 0505  05/18/12 1657   NA  134*  134*   K  3.8  3.8   CL  101  99   CO2  23  25   GLUCOSE  271*  358*   BUN  8  8   CREATININE  0.61  0.70   CALCIUM  9.0  9.5    D-dimer; 1.02  Troponin negative x 3.  UA: spec grav 1.043, >1000 glucose  Upreg: negative   EKG: NSR, no ST changes   CXR: No active disease. No significant change.   CT Head: No acute intracranial abnormality. No significant change.   CT Neck: No acute fracture or subluxation. Mild degenerative changes as  described above.   CT angio chest: Nonocclusive pulmonary embolism within a subsegmental branch of the  left lower lobe pulmonary artery. Overall clot burden is very  small.    Discharge Exam: Blood pressure 97/66, pulse 77, temperature 97.9 F (36.6 C), temperature source Oral, resp. rate 18, height 5' (1.524 m), weight 170 lb 9.6 oz (77.384 kg), last menstrual period 05/04/2012, SpO2 99.00%.  Physical Exam:  General appearance: alert, cooperative and morbidly obese  Resp: clear to auscultation bilaterally  Cardio: regular rate and rhythm, S1, S2 normal, no  murmur, click, rub or gallop  GI: soft, non-tender; bowel sounds normal; no masses, no organomegaly  Extremities: extremities normal, atraumatic, no cyanosis or edema  Skin: Skin color, texture, turgor normal. No rashes or lesions  Disposition: Home/self-care   Medication List  As of 05/19/2012  8:43 AM   STOP taking these medications         ASPIR-LOW 81 MG EC tablet      ibuprofen 400 MG tablet         TAKE these medications         albuterol 90 MCG/ACT inhaler   Commonly known as: PROVENTIL,VENTOLIN   1-2 puffs every 4-6 hours as needed for wheezing or shortness of breath      ergocalciferol 50000 UNITS capsule   Commonly known as: VITAMIN D2   Take 50,000 Units by mouth 2 (two) times a  week. Take twice a week per patient      insulin glargine 100 UNIT/ML injection   Commonly known as: LANTUS   Inject 25 Units into the skin at bedtime.      lisinopril 5 MG tablet   Commonly known as: PRINIVIL,ZESTRIL   Take 1 tablet (5 mg total) by mouth daily.      Rivaroxaban 15 MG Tabs tablet   Commonly known as: XARELTO   Take 1 tablet (15 mg total) by mouth daily.      Rivaroxaban 20 MG Tabs   Take 20 mg by mouth daily.      traMADol 50 MG tablet   Commonly known as: ULTRAM   Take 1 tablet (50 mg total) by mouth every 6 (six) hours as needed for pain.             Signed: DE LA CRUZ,Kandise Riehle 05/19/2012, 8:03 AM

## 2012-05-20 ENCOUNTER — Telehealth: Payer: Self-pay | Admitting: Obstetrics and Gynecology

## 2012-05-20 NOTE — Telephone Encounter (Signed)
Chandra/cht received 

## 2012-05-20 NOTE — Telephone Encounter (Signed)
Will make vph aware per telephone call.

## 2012-05-22 ENCOUNTER — Telehealth: Payer: Self-pay | Admitting: Obstetrics and Gynecology

## 2012-05-22 NOTE — Telephone Encounter (Signed)
Consulted with vph per prev telephone call. Pt may keep appt sched 06/10/12 to discuss bc options. Lm on vm making aware.

## 2012-05-22 NOTE — Telephone Encounter (Signed)
Tc to pt per telephone call. Pt states,"wants to discuss possible Mirena removal due to recent DVT dx". Will consult with vph per recs. Pt agrees.

## 2012-05-26 ENCOUNTER — Encounter (HOSPITAL_COMMUNITY): Payer: Self-pay

## 2012-05-26 ENCOUNTER — Emergency Department (HOSPITAL_COMMUNITY): Payer: Medicare Other

## 2012-05-26 ENCOUNTER — Emergency Department (HOSPITAL_COMMUNITY)
Admission: EM | Admit: 2012-05-26 | Discharge: 2012-05-26 | Disposition: A | Discharge status: Home / Self Care | Payer: Medicare Other | Attending: Emergency Medicine | Admitting: Emergency Medicine

## 2012-05-26 DIAGNOSIS — Z86711 Personal history of pulmonary embolism: Secondary | ICD-10-CM | POA: Insufficient documentation

## 2012-05-26 DIAGNOSIS — G4733 Obstructive sleep apnea (adult) (pediatric): Secondary | ICD-10-CM | POA: Insufficient documentation

## 2012-05-26 DIAGNOSIS — I10 Essential (primary) hypertension: Secondary | ICD-10-CM | POA: Insufficient documentation

## 2012-05-26 DIAGNOSIS — R079 Chest pain, unspecified: Secondary | ICD-10-CM | POA: Insufficient documentation

## 2012-05-26 DIAGNOSIS — E119 Type 2 diabetes mellitus without complications: Secondary | ICD-10-CM | POA: Insufficient documentation

## 2012-05-26 DIAGNOSIS — J45909 Unspecified asthma, uncomplicated: Secondary | ICD-10-CM | POA: Insufficient documentation

## 2012-05-26 NOTE — Discharge Instructions (Signed)
The good news is that you are already taking the medication required to treat a blood clot in your lungs. Your chest x-ray looks great, please continue the medication, followup for your ultrasound on Monday or return to the hospital for severe or worsening symptoms.

## 2012-05-26 NOTE — ED Notes (Signed)
Pt from home by EMS with complaints of left side burning  Chest pain since last night. Pt reports having same s/s with dx of PE on 05/18/2012. EMS place 18G in LAC, pt denies any shortness of breath, normal EKG per EMS.

## 2012-05-26 NOTE — ED Provider Notes (Signed)
History     CSN: [id number]  Arrival date & time 05/26/12  4098   First MD Initiated Contact with Patient 05/26/12 0056      Chief Complaint  Patient presents with  . Chest Pain    (Consider location/radiation/quality/duration/timing/severity/associated sxs/prior treatment) HPI Comments: 41 year old female with a history of pulmonary embolism in 2010 who had a recurrent very small pulmonary embolism approximately one week ago. She was placed on rivaroxaban and has had ongoing mild sx int he LLE which is burning and sometimes radiates to the L chest - athis time she is not having any pain at this time.  No SOB, no fevers, no cough, no back pain and no CP at thist time.  She is scheduled to have a outpatient duplex ultrasound of her lower extremities on Monday. She admits to being compliant with her anticoagulation at home.  Patient is a 41 y.o. female presenting with chest pain. The history is provided by the patient, a relative and medical records.  Chest Pain     Past Medical History  Diagnosis Date  . PE (pulmonary embolism)     2009 - on oral contraception  . Syncopal episodes     unknown etiology  . DM (diabetes mellitus)   . Dysfunctional uterine bleeding     Seen by Dr. Pennie Rushing, GYN  . Asthma   . OSA (obstructive sleep apnea)   . Major depression     Hx suicide attempt in 2009, [facility] admissions  . Syncope and collapse     unknow etiology extensive w/u cardiology  . Obesity   . Mastodynia   . Hypertension     Past Surgical History  Procedure Date  . Breast reduction surgery     Dr. Kathie Dike [facility] 2011  . Cholecystectomy   . Cardiac electrophysiology study & dft     Implantable Loop recorder    Family History  Problem Relation Age of Onset  . Melanoma Father 80  . Ulcerative colitis Mother 59  . Breast cancer Paternal Grandmother   . Diabetes type II Maternal Grandmother     deceased 47  . Pulmonary embolism Paternal Grandfather     History  Substance  Use Topics  . Smoking status: Never Smoker   . Smokeless tobacco: Not on file  . Alcohol Use: No    OB History    Grav Para Term Preterm Abortions TAB SAB Ect Mult Living                  Review of Systems  Cardiovascular: Positive for chest pain.  All other systems reviewed and are negative.    Allergies  Codeine; Darvocet; Hydrocodone-acetaminophen; Latex; Oxycodone-acetaminophen; and Percocet  Home Medications   Current Outpatient Rx  Name Route Sig Dispense Refill  . ALBUTEROL 90 MCG/ACT IN AERS  1-2 puffs every 4-6 hours as needed for wheezing or shortness of breath     . ERGOCALCIFEROL 50000 UNITS PO CAPS Oral Take 50,000 Units by mouth 2 (two) times a week. Take twice a week per patient    . INSULIN ASPART 100 UNIT/ML [place] SOLN Subcutaneous Inject 4 Units into the skin 3 (three) times daily as needed. For blood sugar over 200 at meal time    . INSULIN GLARGINE 100 UNIT/ML [place] SOLN Subcutaneous Inject 30 Units into the skin at bedtime.     Marland Kitchen LISINOPRIL 20 MG PO TABS Oral Take 20 mg by mouth daily.    Marland Kitchen RIVAROXABAN 15 MG PO TABS  Oral Take 1 tablet (15 mg total) by mouth 2 (two) times daily. Take 05/19/12 through 06/09/12 then star taking 20mg  pills once daily. 42 tablet 0  . TRAMADOL HCL 50 MG PO TABS Oral Take 1 tablet (50 mg total) by mouth every 6 (six) hours as needed for pain. 40 tablet 0    BP 122/80  Temp 98.2 F (36.8 C) (Oral)  Resp 17  SpO2 96%  LMP 05/04/2012  Physical Exam  Nursing note and vitals reviewed. Constitutional: She appears well-developed and well-nourished. No distress.  HENT:  Head: Normocephalic and atraumatic.  Mouth/Throat: Oropharynx is clear and moist. No oropharyngeal exudate.  Eyes: Conjunctivae and EOM are normal. Pupils are equal, round, and reactive to light. Right eye exhibits no discharge. Left eye exhibits no discharge. No scleral icterus.  Neck: Normal range of motion. Neck supple. No JVD present. No thyromegaly present.    Cardiovascular: Normal rate, regular rhythm, normal heart sounds and intact distal pulses.  Exam reveals no gallop and no friction rub.   No murmur heard. Pulmonary/Chest: Effort normal and breath sounds normal. No respiratory distress. She has no wheezes. She has no rales.  Abdominal: Soft. Bowel sounds are normal. She exhibits no distension and no mass. There is no tenderness.  Musculoskeletal: Normal range of motion. She exhibits no edema and no tenderness.  Lymphadenopathy:    She has no cervical adenopathy.  Neurological: She is alert. Coordination normal.  Skin: Skin is warm and dry. No rash noted. No erythema.  Psychiatric: She has a normal mood and affect. Her behavior is normal.    ED Course  Procedures (including critical care time)  Labs Reviewed - No data to display No results found.   No diagnosis found.    MDM  Lungs are clear, no abnormal lung sounds, normal vital signs including oxygen of 98% on room air, no tachycardia and normal peripheral pulses. Lower 70s do not show any edema, she is extremely low risk for significant clot burden and is already anticoagulated appropriately. We'll obtain chest x-ray to rule out any sort of pulmonary ischemia from clot though according to my interpretation of her prior CT scan which is in agreement with the radiologist interpretation there was an extremely small amount of clot burden on the prior scan.  No signs of right heart strain on EKG  ED ECG REPORT   Date: 05/26/2012 I have personally interpreted the EKG  Rate: 91  Rhythm: normal sinus rhythm  QRS Axis: normal  Intervals: normal  ST/T Wave abnormalities: normal  Conduction Disutrbances:none  Narrative Interpretation:   Old EKG Reviewed: Compared with 05/18/2012, no significant changes   Vital signs appear reassuring, chest x-ray shows no signs of acute findings per minute drip rotation there is no focal infiltrates, pneumothorax or mediastinal abnormalities. Patient  informed of results, will continue taking her anticoagulation followup for her ultrasound on Monday.     Vida Roller, MD 05/26/12 [phone]

## 2012-06-10 ENCOUNTER — Encounter: Payer: Self-pay | Admitting: Obstetrics and Gynecology

## 2012-06-10 ENCOUNTER — Ambulatory Visit (INDEPENDENT_AMBULATORY_CARE_PROVIDER_SITE_OTHER): Payer: Medicaid Other | Admitting: Obstetrics and Gynecology

## 2012-06-10 VITALS — BP 122/62 | Ht 60.0 in | Wt 170.0 lb

## 2012-06-10 DIAGNOSIS — B9689 Other specified bacterial agents as the cause of diseases classified elsewhere: Secondary | ICD-10-CM

## 2012-06-10 DIAGNOSIS — N760 Acute vaginitis: Secondary | ICD-10-CM

## 2012-06-10 DIAGNOSIS — A499 Bacterial infection, unspecified: Secondary | ICD-10-CM

## 2012-06-10 DIAGNOSIS — N926 Irregular menstruation, unspecified: Secondary | ICD-10-CM

## 2012-06-10 DIAGNOSIS — Z719 Counseling, unspecified: Secondary | ICD-10-CM

## 2012-06-10 DIAGNOSIS — N898 Other specified noninflammatory disorders of vagina: Secondary | ICD-10-CM

## 2012-06-10 DIAGNOSIS — I2699 Other pulmonary embolism without acute cor pulmonale: Secondary | ICD-10-CM

## 2012-06-10 LAB — POCT WET PREP (WET MOUNT): pH: 5

## 2012-06-10 MED ORDER — METRONIDAZOLE 0.75 % VA GEL: 1.0000 | Freq: Every day | VAGINAL | Status: AC

## 2012-06-10 NOTE — Progress Notes (Signed)
Subjective: Pt here to discuss contraception. States that she was in [place] on 06/08/2013n for blood clots due to Mirena. Also states that she had blood clots when taking BC pills also. Mirena was placed 11/17/2010 After D&C for endometrial hyperplasia with a history of heavy irregular menses. The patient has done reasonably well with a Mirena however her history of pulmonary embolism first on birth control pills and now on Mirena raises concern.  Now at age 41 and with multiple medical problems the patient is ready to consider permanent sterilization and definitive measures for her abnormal uterine bleeding in the face of recurrent pulmonary emboli on birth control  Objective: BP 122/62  Ht 5' (1.524 m)  Wt 170 lb (77.111 kg)  BMI 33.20 kg/m2  LMP 06/05/2012 Pelvic exam:  VULVA: normal appearing vulva with no masses, tenderness or lesions,    VAGINA: normal appearing vagina with normal color and discharge, no lesions,    CERVIX: normal appearing cervix without discharge or lesions, IUD string present,    UTERUS: uterus is normal size, shape, consistency and nontender,    ADNEXA: normal adnexa in size, nontender and no masses.  Impression: Recurrent pulmonary emboli, first with oral contraceptive pills and now with Mirena contraception History of menorrhagia with resultant anemia History of endometrial hyperplasia Type 2 diabetes complicated by neuropathy Chronic hypertension Asthma  Recommendation: We had a prolonged discussion about these complex clinical issues and went over the various important aspects to consider. All questions were answered.I have recommended hysterectomy as definitive therapy for the patient gynecologic issues and to provide contraception. He is particularly important that she not have an episode of uncontrolled vaginal bleeding while on xarelto because of the difficulty returning the patient to normal coagulation status. I explained the risks of anesthesia,  bleeding, infection, and potential damage to adjacent organs. I emphasized to the risk of deep venous thrombosis with pelvic surgery and resultant recurrent pulmonary embolism. The patient was to have had hematology followup after her hospitalization however that has not yet been arranged. I will arrange that from this office. I will work with the hematologist to determine the safest time for the patient's pelvic surgery with respect to her most recent embolism. I will ask the hematologist to consult concerning the normalization of coagulation in the immediate operative. And resumption of and high coagulation thereafter. Appt is with Dr.Khan on 06/17/12 The patient will be scheduled for an endometrial biopsy prior to her surgery.

## 2012-06-11 ENCOUNTER — Telehealth: Payer: Self-pay | Admitting: *Deleted

## 2012-06-11 ENCOUNTER — Telehealth: Payer: Self-pay | Admitting: Oncology

## 2012-06-11 LAB — PAP IG AND HPV HIGH-RISK: HPV DNA High Risk: NOT DETECTED

## 2012-06-11 NOTE — Telephone Encounter (Signed)
PATIENT CONFIRMED OVER THE PHONE THE NEW DATE AND TIMEON 06-17-2012 STARTING AT 12:30PM

## 2012-06-11 NOTE — Telephone Encounter (Signed)
Referred by Dr. Dierdre Forth Dx- Recent PE

## 2012-06-17 ENCOUNTER — Ambulatory Visit (HOSPITAL_BASED_OUTPATIENT_CLINIC_OR_DEPARTMENT_OTHER): Payer: Medicare Other | Admitting: Oncology

## 2012-06-17 ENCOUNTER — Other Ambulatory Visit: Payer: Medicare Other | Admitting: Lab

## 2012-06-17 ENCOUNTER — Ambulatory Visit: Payer: Medicare Other

## 2012-06-17 ENCOUNTER — Telehealth: Payer: Self-pay | Admitting: Oncology

## 2012-06-17 ENCOUNTER — Ambulatory Visit: Payer: Medicare Other | Admitting: Lab

## 2012-06-17 ENCOUNTER — Encounter: Payer: Self-pay | Admitting: Oncology

## 2012-06-17 VITALS — BP 116/80 | HR 92 | Temp 97.9°F | Ht 60.0 in | Wt 169.2 lb

## 2012-06-17 DIAGNOSIS — I2699 Other pulmonary embolism without acute cor pulmonale: Secondary | ICD-10-CM

## 2012-06-17 DIAGNOSIS — Z975 Presence of (intrauterine) contraceptive device: Secondary | ICD-10-CM

## 2012-06-17 DIAGNOSIS — Z832 Family history of diseases of the blood and blood-forming organs and certain disorders involving the immune mechanism: Secondary | ICD-10-CM

## 2012-06-17 DIAGNOSIS — Z86718 Personal history of other venous thrombosis and embolism: Secondary | ICD-10-CM

## 2012-06-17 LAB — CBC WITH DIFFERENTIAL/PLATELET
Eosinophils Absolute: 0.1 10*3/uL (ref 0.0–0.5)
HCT: 38.5 % (ref 34.8–46.6)
LYMPH%: 31 % (ref 14.0–49.7)
MONO#: 0.6 10*3/uL (ref 0.1–0.9)
NEUT#: 6.6 10*3/uL — ABNORMAL HIGH (ref 1.5–6.5)
NEUT%: 62.6 % (ref 38.4–76.8)
Platelets: 283 10*3/uL (ref 145–400)
RBC: 4.73 10*6/uL (ref 3.70–5.45)
WBC: 10.6 10*3/uL — ABNORMAL HIGH (ref 3.9–10.3)

## 2012-06-17 NOTE — Patient Instructions (Addendum)
1. Check full hypercoag panel to see shy you have been developing blood clots.  2. Continue xarelto for now  3. I will see you back in 3 weeks to discuss results of your blood tests and then make a plan of how mange the blood thinner with your upcoming surgery

## 2012-06-17 NOTE — Progress Notes (Signed)
[facility]  [phone] [phone]     INITIAL HEMATOLOGY CONSULTATION    Referral MD:  Dr. Dierdre Forth   Reason for Referral: Management of anticoagulation and evaluation of hypercoagulability  HPI: 41 year old female with history of left DVT and pulmonary embolism in 2009 after being on birth control pills. Treated with coumadin for 1 year. Then take off. 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 Remained off any anti-coagulation. Patient  Had mirena IUD on 10/2010 due to dysfunctional uterine bleeding. Patient continued to well until 05/18/12 when patient developed chest pains and tingling in the legs, with vomiting. Patient seen in ER and was found to have a PE. Patient was anti-coagulated with xeralto. Patient still has mirena IUD in place. Patient seen by gynecologist and recommended hysterectomy for dysfunctional bleeding.  Past Medical History  Diagnosis Date  . PE (pulmonary embolism)     2009 - on oral contraception  . Syncopal episodes     unknown etiology  . DM (diabetes mellitus)   . Dysfunctional uterine bleeding     Seen by Dr. Pennie Rushing, GYN  . Asthma   . OSA (obstructive sleep apnea)   . Major depression     Hx suicide attempt in 2009, [facility] admissions  . Syncope and collapse     unknow etiology extensive w/u cardiology  . Obesity   . Mastodynia   . Hypertension   . Post - coital bleeding 2012  . Labial lesion 2013  . BV (bacterial vaginosis) 2012  . Oligomenorrhea 2011  . DVT (deep venous thrombosis) 2011  :    Past Surgical History  Procedure Date  . Breast reduction surgery     Dr. Kathie Dike [facility] 2011  . Cholecystectomy   . Cardiac electrophysiology study & dft     Implantable Loop recorder  . Endometrial biopsy 2011  :   CURRENT MEDS: Current Outpatient Prescriptions  Medication Sig Dispense Refill  . albuterol (PROVENTIL,VENTOLIN) 90  MCG/ACT inhaler 1-2 puffs every 4-6 hours as needed for wheezing or shortness of breath       . ergocalciferol (VITAMIN D2) 50000 UNITS capsule Take 50,000 Units by mouth 2 (two) times a week. Take twice a week per patient      . insulin aspart (NOVOLOG) 100 UNIT/ML injection Inject 4 Units into the skin 3 (three) times daily as needed. For blood sugar over 200 at meal time      . insulin glargine (LANTUS) 100 UNIT/ML injection Inject 30 Units into the skin at bedtime.       Marland Kitchen lisinopril (PRINIVIL,ZESTRIL) 20 MG tablet Take 20 mg by mouth daily.      . Rivaroxaban (XARELTO) 15 MG TABS tablet Take 1 tablet (15 mg total) by mouth 2 (two) times daily. Take 05/19/12 through 06/09/12 then star taking 20mg  pills once daily.  42 tablet  0  . metroNIDAZOLE (METROGEL VAGINAL) 0.75 % vaginal gel Place 1 Applicatorful vaginally at bedtime.  70 g  0      Allergies  Allergen Reactions  . Codeine     Fever   . Darvocet (Propoxyphene-Acetaminophen)     vomiting  . Hydrocodone-Acetaminophen     Vomiting   . Latex     rash  . Oxycodone-Acetaminophen     vomiting  . Percocet (Oxycodone-Acetaminophen)     vomiting  :  Family History  Problem Relation Age of Onset  .  Melanoma Father 56  . Ulcerative colitis Mother 85  . Breast cancer Paternal Grandmother   . Diabetes type II Maternal Grandmother     deceased 82  . Pulmonary embolism Paternal Grandfather   :  History   Social History  . Marital Status: Single    Spouse Name: N/A    Number of Children: N/A  . Years of Education: N/A   Occupational History  . 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 Smoking status: Never Smoker   . Smokeless tobacco: Never Used  . Alcohol Use: No  . Drug Use: No  . Sexually Active: Yes    Birth Control/ Protection: IUD     Mirena    Other Topics Concern  . Not on file   Social History Narrative   Lives in [place] with significant other,  WilliamCompleted 10th grade.Worker Compensation Case 2009-2012 related to PE  :  REVIEW OF SYSTEM:  The rest of the 14-point review of sytem was negative.   Exam: Blood pressure 116/80, pulse 92, temperature 97.9 F (36.6 C), temperature source Oral, height 5' (1.524 m), weight 169 lb 3.2 oz (76.749 kg), last menstrual period 06/05/2012.   General:  well-nourished in no acute distress.  Eyes:  no scleral icterus.  ENT:  There were no oropharyngeal lesions.  Neck was without thyromegaly.  Lymphatics:  Negative cervical, supraclavicular or axillary adenopathy.  Respiratory: lungs were clear bilaterally without wheezing or crackles.  Cardiovascular:  Regular rate and rhythm, S1/S2, without murmur, rub or gallop.  There was no pedal edema.  GI:  abdomen was soft, flat, nontender, nondistended, without organomegaly.  Muscoloskeletal:  no spinal tenderness of palpation of vertebral spine.  Skin exam was without echymosis, petichae.  Neuro exam was nonfocal.  Patient was able to get on and off exam table without assistance.  Gait was normal.  Patient was alerted and oriented.  Attention was good.   Language was appropriate.  Mood was normal without depression.  Speech was not pressured.  Thought content was not tangential.    LABS:  Lab Results  Component Value Date   WBC 13.8* 05/19/2012   HGB 12.4 05/19/2012   HCT 37.1 05/19/2012   PLT 297 05/19/2012   GLUCOSE 271* 05/19/2012   CHOL  Value: 140        ATP III CLASSIFICATION:  <200     mg/dL   Desirable  956-213  mg/dL   Borderline High  >=086    mg/dL   High        5/78/4696   TRIG 102 01/02/2011   HDL 36* 01/02/2011   LDLCALC  Value: 84        Total Cholesterol/HDL:CHD Risk Coronary Heart Disease Risk Table                     Men   Women  1/2 Average Risk   3.4   3.3  Average Risk       5.0   4.4  2 X Average Risk   9.6   7.1  3 X Average Risk  23.4   11.0        Use the calculated Patient Ratio above and the CHD Risk Table to determine the patient's CHD  Risk.        ATP III CLASSIFICATION (LDL):  <100     mg/dL   Optimal  295-284  mg/dL  Near or Above                    Optimal  130-159  mg/dL   Borderline  409-811  mg/dL   High  >914     mg/dL   Very High 7/82/9562   ALT 19 05/18/2012   AST 15 05/18/2012   NA 134* 05/19/2012   K 3.8 05/19/2012   CL 101 05/19/2012   CREATININE 0.61 05/19/2012   BUN 8 05/19/2012   CO2 23 05/19/2012   INR 1.06 05/18/2012   HGBA1C 11.3* 05/18/2012   MICROALBUR 0.64 07/04/2010    Dg Chest 2 View  05/26/2012  *RADIOLOGY REPORT*  Clinical Data: Shortness of breath; small pulmonary embolus recently diagnosed.  CHEST - 2 VIEW  Comparison: Chest radiograph and CTA of the chest performed 05/18/2012  Findings: The lungs are well-aerated and clear.  There is no evidence of focal opacification, pleural effusion or pneumothorax.  The heart is normal in size; the mediastinal contour is within normal limits.  A metallic device is noted at the left chest wall. No acute osseous abnormalities are seen.  Clips are noted within the right upper quadrant, reflecting prior cholecystectomy.  IMPRESSION: No acute cardiopulmonary process seen.  Original Report Authenticated By: Tonia Ghent, M.D.   Dg Chest 2 View  05/18/2012  *RADIOLOGY REPORT*  Clinical Data: Chest pain, headache  CHEST - 2 VIEW  Comparison: 03/18/2012  Findings: Cardiomediastinal silhouette is stable.  No acute infiltrate or pleural effusion.  No pulmonary edema.  A cardiac monitor of the left chest again noted.  IMPRESSION: No active disease.  No significant change.  Original Report Authenticated By: Natasha Mead, M.D.   Ct Head Wo Contrast  05/18/2012  *RADIOLOGY REPORT*  Clinical Data:  Chest pain, headache, right arm numbness  CT HEAD WITHOUT CONTRAST CT CERVICAL SPINE WITHOUT CONTRAST  Technique:  Multidetector CT imaging of the head and cervical spine was performed following the standard protocol without intravenous contrast.  Multiplanar CT image reconstructions of the cervical spine  were also generated.  Comparison:  CT scan 02/15/2009  CT HEAD  Findings: No skull fracture is noted.  No intracranial hemorrhage, mass effect or midline shift.  Stable cerebral atrophy.  Paranasal sinuses and mastoid air cells are unremarkable.  No acute infarction.  No mass lesion is noted on this unenhanced scan.  IMPRESSION: No acute intracranial abnormality.  No significant change.  CT CERVICAL SPINE  Findings: Axial images of the cervical spine shows no acute fracture or subluxation there is no pneumothorax in visualized lung apices.  There is reversal of the cervical lordosis.  Mild anterior spurring noted lower endplate of C2 vertebral body.  Moderate anterior spurring noted lower endplate of C5 vertebral body.  Mild posterior spurring at C5-C6 and C6-C7 level.  No prevertebral soft tissue swelling.  Cervical airway is patent.  No significant neural foraminal narrowing noted.  IMPRESSION: No acute fracture or subluxation.  Mild degenerative changes as described above.  Original Report Authenticated By: Natasha Mead, M.D.   Ct Angio Chest W/cm &/or Wo Cm  05/18/2012  *RADIOLOGY REPORT*  Clinical Data: Chest pain, positive D-dimer, history of bilateral PE  CT ANGIOGRAPHY CHEST  Technique:  Multidetector CT imaging of the chest using the standard protocol during bolus administration of intravenous contrast. Multiplanar reconstructed images including MIPs were obtained and reviewed to evaluate the vascular anatomy.  Contrast: 80mL OMNIPAQUE IOHEXOL 350 MG/ML SOLN  Comparison: Chest radiographs dated 05/18/2012.  Findings:  Tiny filling defect within a subsegmental branch of the left lower lobe pulmonary artery (series 6/image 115), compatible with nonocclusive pulmonary embolism.  Overall clot burden is very small.  Lungs are essentially clear.  Mild suspected centrilobular emphysematous changes in the lung apices. No pleural effusion or pneumothorax.  Visualized thyroid is unremarkable.  Cardiomegaly.  No  pericardial effusion.  Implanted monitor in the left chest wall.  No suspicious mediastinal, hilar, or axillary lymphadenopathy.  Visualized upper abdomen is notable for cholecystectomy clips.  Mild degenerative changes of the visualized thoracolumbar spine.  IMPRESSION: Nonocclusive pulmonary embolism within a subsegmental branch of the left lower lobe pulmonary artery.  Overall clot burden is very small.  Critical Value/emergent results were called by telephone at the time of interpretation on 05/18/2012  at 2005 hours  to  Dr. Carleene Cooper, who verbally acknowledged these results.  Original Report Authenticated By: Charline Bills, M.D.   Ct Cervical Spine Wo Contrast  05/18/2012  *RADIOLOGY REPORT*  Clinical Data:  Chest pain, headache, right arm numbness  CT HEAD WITHOUT CONTRAST CT CERVICAL SPINE WITHOUT CONTRAST  Technique:  Multidetector CT imaging of the head and cervical spine was performed following the standard protocol without intravenous contrast.  Multiplanar CT image reconstructions of the cervical spine were also generated.  Comparison:  CT scan 02/15/2009  CT HEAD  Findings: No skull fracture is noted.  No intracranial hemorrhage, mass effect or midline shift.  Stable cerebral atrophy.  Paranasal sinuses and mastoid air cells are unremarkable.  No acute infarction.  No mass lesion is noted on this unenhanced scan.  IMPRESSION: No acute intracranial abnormality.  No significant change.  CT CERVICAL SPINE  Findings: Axial images of the cervical spine shows no acute fracture or subluxation there is no pneumothorax in visualized lung apices.  There is reversal of the cervical lordosis.  Mild anterior spurring noted lower endplate of C2 vertebral body.  Moderate anterior spurring noted lower endplate of C5 vertebral body.  Mild posterior spurring at C5-C6 and C6-C7 level.  No prevertebral soft tissue swelling.  Cervical airway is patent.  No significant neural foraminal narrowing noted.  IMPRESSION:  No acute fracture or subluxation.  Mild degenerative changes as described above.  Original Report Authenticated By: Natasha Mead, M.D.      ASSESSMENT AND PLAN: 41 year old female with history of pulmonary embolism back in 2009 at which time she was anticoagulated for one year. She now has developed a second PE while having a Mirena IUD in place. Maxie Better is being used as a contraceptive and treatment for her dysfunctional uterine bleeding. On 6/8 patient was seen in the emergency room for chest pains and was found to have a pulmonary embolism. She was started on xarelto orally As a form of anticoagulant that inhibits antithrombin 3. Patient has been seen by Dr. Pennie Rushing who is planning on taking the patient to surgery for a total hysterectomy do to her underlying dysfunctional uterine bleeding.Of note patient has never been seen by a hematologist in the past.  The patient and I discussed workup for hypercoagulability. She does tell me that her paternal grandfather may have had blood clots. I do think that the patient should have a full hypercoagulability workup performed. I have gone ahead and ordered this. In the meantime I do think she should be off all birth control pills in devices such as the IUD. I do agree with Dr. Pennie Rushing that the best form of treatment for her dysfunctional uterine bleeding may be a  hysterectomy. But due to patient's history of having had blood clots this may become tricky so that she does not recall at.  Recommendation is for her to come off of xarelto about 2-3 days prior to her surgery.  I would recommend checking an activated PTT as well as a thrombin level since these are thrombin inhibitors To make sure the drug is out of her system to avoid perioperative bleeding as well as postop bleeding. Once patient has undergone the surgical procedure she could certainly go back on several toe about 1-2 days post op. Patient does understand that in spite of our best recommendations the  Experience with on this anticoagulant is limited.  We also discussed starting Lovenox postop if that would be safer in Dr. Lilian Coma opinion. I do think the advantage of xarelto ease of administration and the fact that we do not have to monitor the levels.  Patient and I will see each other again in about 3 weeks time to discuss her hypercoagulability panel and hopefully by then a final decision will be made regarding her surgical scheduling.  Drue Second, MD Medical/Oncology [facility] [phone] (beeper) [phone] (Office)  06/17/2012, 4:22 PM

## 2012-06-17 NOTE — Telephone Encounter (Signed)
gve the pt her aug 2013 appt calendar 

## 2012-06-19 LAB — HYPERCOAGULABLE PANEL, COMPREHENSIVE
Anticardiolipin IgG: 0 GPL U/mL (ref <23)
Beta-2 Glyco I IgG: 0 G Units (ref <20)
Beta-2-Glycoprotein I IgA: 5 A Units (ref <20)
Lupus Anticoagulant: NOT DETECTED
Protein C, Total: 103 % (ref 72–160)
Protein S Total: 111 % (ref 60–150)

## 2012-06-19 LAB — COMPREHENSIVE METABOLIC PANEL
CO2: 27 mEq/L (ref 19–32)
Calcium: 9.1 mg/dL (ref 8.4–10.5)
Glucose, Bld: 225 mg/dL — ABNORMAL HIGH (ref 70–99)
Sodium: 134 mEq/L — ABNORMAL LOW (ref 135–145)
Total Bilirubin: 0.3 mg/dL (ref 0.3–1.2)
Total Protein: 7 g/dL (ref 6.0–8.3)

## 2012-07-02 ENCOUNTER — Encounter: Payer: Self-pay | Admitting: Obstetrics and Gynecology

## 2012-07-02 ENCOUNTER — Ambulatory Visit (INDEPENDENT_AMBULATORY_CARE_PROVIDER_SITE_OTHER): Payer: Medicaid Other | Admitting: Obstetrics and Gynecology

## 2012-07-02 VITALS — BP 112/70 | Ht 61.0 in | Wt 171.0 lb

## 2012-07-02 DIAGNOSIS — B3731 Acute candidiasis of vulva and vagina: Secondary | ICD-10-CM

## 2012-07-02 DIAGNOSIS — B373 Candidiasis of vulva and vagina: Secondary | ICD-10-CM

## 2012-07-02 DIAGNOSIS — Z719 Counseling, unspecified: Secondary | ICD-10-CM

## 2012-07-02 DIAGNOSIS — N898 Other specified noninflammatory disorders of vagina: Secondary | ICD-10-CM

## 2012-07-02 DIAGNOSIS — N920 Excessive and frequent menstruation with regular cycle: Secondary | ICD-10-CM

## 2012-07-02 LAB — POCT WET PREP (WET MOUNT)

## 2012-07-02 MED ORDER — FLUCONAZOLE 150 MG PO TABS: ORAL_TABLET | ORAL | Status: DC

## 2012-07-02 NOTE — Progress Notes (Signed)
HX MENORRHAGIA. S: S/P pulmonary embolus. Needs non hormonal approach to menorrhagia.  Plans hysterectomy as soon as advised by hematologist. Pt here today for EBX. Pt declines pain meds.  C/o vulvar itching after metrogel treatment.  Tried OTC antifungal without relief  O: BP 112/70  Ht 5\' 1"  (1.549 m)  Wt 171 lb (77.565 kg)  BMI 32.31 kg/m2  LMP 07/02/2012  Vulva: Some excoriation  Vagina:White d/c  Cx: IUD string noted  Utx: ULNS nontender Sounds 10 cm  Adnx: No masses   Wet Prep: Yeast  A: Menorrhagia controlled with Mirena  Recurrent pulmonary embolism with Mirena in place  Yeast  P: Endo bx per protocol  Diflucan 150 mg qod for 3 doses  F/U for pre op for hysterectomy

## 2012-07-04 LAB — PATHOLOGY

## 2012-07-15 ENCOUNTER — Encounter: Payer: Self-pay | Admitting: Oncology

## 2012-07-15 ENCOUNTER — Telehealth: Payer: Self-pay | Admitting: Oncology

## 2012-07-15 ENCOUNTER — Ambulatory Visit (HOSPITAL_BASED_OUTPATIENT_CLINIC_OR_DEPARTMENT_OTHER): Payer: Medicare Other | Admitting: Oncology

## 2012-07-15 VITALS — BP 110/74 | HR 94 | Temp 98.5°F | Resp 20 | Ht 61.0 in | Wt 170.6 lb

## 2012-07-15 DIAGNOSIS — D649 Anemia, unspecified: Secondary | ICD-10-CM

## 2012-07-15 DIAGNOSIS — I82409 Acute embolism and thrombosis of unspecified deep veins of unspecified lower extremity: Secondary | ICD-10-CM

## 2012-07-15 DIAGNOSIS — D6859 Other primary thrombophilia: Secondary | ICD-10-CM

## 2012-07-15 DIAGNOSIS — I2699 Other pulmonary embolism without acute cor pulmonale: Secondary | ICD-10-CM

## 2012-07-15 NOTE — Telephone Encounter (Signed)
gve the pt her sept 2013 appt calendar °

## 2012-07-15 NOTE — Patient Instructions (Addendum)
I will see you back in mid September for follow up

## 2012-07-15 NOTE — Progress Notes (Signed)
OFFICE PROGRESS NOTE  CC  Windy Carina, PA-C [address] [place] [address] Dr. Dierdre Forth  DIAGNOSIS: 41 year old female with hypercoagulability presented with pulmonary embolism last PE was May 18 2012. Patient is currently on Xeralto. She is awaiting a hysterectomy for management of her dysfunctional uterine bleeding.  PRIOR THERAPY:Graph #1 patient has a history of left DVT on pulmonary embolism in 2009 after being on birth control pills. For that she was treated for one year.  #2 in 2010 patient developed a second DVT but she remained off anticoagulation. Due to the dysfunction uterine bleeding patient has been on Miranda IUD since 2011.  #3 in 05/18/2012 patient developed pulmonary embolism again. She was begun on anticoagulation with xeralto. Patient will eventually have a hysterectomy for management of her dysfunctional uterine bleeding.  CURRENT THERAPY:xeralto  INTERVAL HISTORY: Makayla Frazier 42 y.o. female returns for Followup visit today. Overall she is doing well. She has been seen by Dr. Pennie Rushing. She is planning on having a hysterectomy eventually. However timing is extremely important since patient has had a PE just back in June of this year. Clinically she seems to be doing well she has not had any recurrent chest pains or tingling and numbness. She is tolerating her anticoagulant quite well she has no bleeding problems whatsoever. Remainder of the review of systems is negative.  MEDICAL HISTORY: Past Medical History  Diagnosis Date  . PE (pulmonary embolism)     2009 - on oral contraception  . Syncopal episodes     unknown etiology  . DM (diabetes mellitus)   . Dysfunctional uterine bleeding     Seen by Dr. Pennie Rushing, GYN  . Asthma   . OSA (obstructive sleep apnea)   . Major depression     Hx suicide attempt in 2009, [facility] admissions  . Syncope and collapse     unknow etiology extensive w/u cardiology  . Obesity   . Mastodynia   . Hypertension   .  Post - coital bleeding 2012  . Labial lesion 2013  . BV (bacterial vaginosis) 2012  . Oligomenorrhea 2011  . DVT (deep venous thrombosis) 2011    ALLERGIES:  is allergic to codeine; darvocet; hydrocodone-acetaminophen; latex; oxycodone-acetaminophen; and percocet.  MEDICATIONS:  Current Outpatient Prescriptions  Medication Sig Dispense Refill  . albuterol (PROVENTIL,VENTOLIN) 90 MCG/ACT inhaler 1-2 puffs every 4-6 hours as needed for wheezing or shortness of breath       . ergocalciferol (VITAMIN D2) 50000 UNITS capsule Take 50,000 Units by mouth 2 (two) times a week. Take twice a week per patient      . fluconazole (DIFLUCAN) 150 MG tablet Take one tab every other day for 3 doses ( 5 days)  3 tablet  0  . insulin aspart (NOVOLOG) 100 UNIT/ML injection Inject 6 Units into the skin 3 (three) times daily as needed. For blood sugar over 200 at meal time      . insulin glargine (LANTUS) 100 UNIT/ML injection Inject 40 Units into the skin at bedtime.       Marland Kitchen lisinopril (PRINIVIL,ZESTRIL) 20 MG tablet Take 20 mg by mouth daily.      . Rivaroxaban (XARELTO) 15 MG TABS tablet Take 1 tablet (15 mg total) by mouth 2 (two) times daily. Take 05/19/12 through 06/09/12 then star taking 20mg  pills once daily.  42 tablet  0    SURGICAL HISTORY:  Past Surgical History  Procedure Date  . Breast reduction surgery     Dr.  Defranzo [facility] 2011  . Cholecystectomy   . Cardiac electrophysiology study & dft     Implantable Loop recorder  . Endometrial biopsy 2011    REVIEW OF SYSTEMS:  Pertinent items are noted in HPI.   PHYSICAL EXAMINATION:  Gen.: Well-developed well-nourished female in no acute distress. Lungs: Distant breath sounds Extremities +1 edema Neuro: Nonfocal Skin: No bruises or rashes ECOG PERFORMANCE STATUS: 1 - Symptomatic but completely ambulatory  Blood pressure 110/74, pulse 94, temperature 98.5 F (36.9 C), resp. rate 20, height 5\' 1"  (1.549 m), weight 170 lb 9.6 oz (77.384 kg),  last menstrual period 07/02/2012.  LABORATORY DATA: Lab Results  Component Value Date   WBC 10.6* 06/17/2012   HGB 12.1 06/17/2012   HCT 38.5 06/17/2012   MCV 81.3 06/17/2012   PLT 283 06/17/2012      Chemistry      Component Value Date/Time   NA 134* 06/17/2012 1420   K 3.8 06/17/2012 1420   CL 98 06/17/2012 1420   CO2 27 06/17/2012 1420   BUN 10 06/17/2012 1420   CREATININE 0.78 06/17/2012 1420   CREATININE 0.64 01/26/2011 1030      Component Value Date/Time   CALCIUM 9.1 06/17/2012 1420   ALKPHOS 51 06/17/2012 1420   AST 11 06/17/2012 1420   ALT 16 06/17/2012 1420   BILITOT 0.3 06/17/2012 1420       RADIOGRAPHIC STUDIES:  No results found.  ASSESSMENT: 41 year old female with hypercoagulability. Patient had a complete hypoechoic panel performed which was negative. Thus I do believe that her hypercoagulability and her recurrent thrombosis is due to contraception.She is now on several toe tolerating it well without any evidence of bleeding.   PLAN: Recommendation is for the patient to continue to be on xeralto at least for another 2 months. Late September we certainly could have her come off of it for her surgery. I do think we should have at least 3 months of full anticoagulation in order for her to be able to come off it to proceed with surgery. She certainly will require full anticoagulation after her surgery.   All questions were answered. The patient knows to call the clinic with any problems, questions or concerns. We can certainly see the patient much sooner if necessary.  I spent 15 minutes counseling the patient face to face. The total time spent in the appointment was 30 minutes.    Drue Second, MD Medical/Oncology [facility] [phone] (beeper) [phone] (Office)  07/15/2012, 1:19 PM

## 2012-08-03 ENCOUNTER — Emergency Department (HOSPITAL_COMMUNITY): Payer: Medicare Other

## 2012-08-03 ENCOUNTER — Inpatient Hospital Stay (HOSPITAL_COMMUNITY): Payer: Medicare Other

## 2012-08-03 ENCOUNTER — Inpatient Hospital Stay (HOSPITAL_COMMUNITY)
Admission: EM | Admit: 2012-08-03 | Discharge: 2012-08-05 | DRG: 312 | Disposition: A | Discharge status: Home / Self Care | Payer: Medicare Other | Attending: Internal Medicine | Admitting: Internal Medicine

## 2012-08-03 ENCOUNTER — Encounter (HOSPITAL_COMMUNITY): Payer: Self-pay | Admitting: Internal Medicine

## 2012-08-03 DIAGNOSIS — E785 Hyperlipidemia, unspecified: Secondary | ICD-10-CM | POA: Diagnosis present

## 2012-08-03 DIAGNOSIS — G939 Disorder of brain, unspecified: Secondary | ICD-10-CM | POA: Insufficient documentation

## 2012-08-03 DIAGNOSIS — I10 Essential (primary) hypertension: Secondary | ICD-10-CM | POA: Diagnosis present

## 2012-08-03 DIAGNOSIS — R079 Chest pain, unspecified: Secondary | ICD-10-CM

## 2012-08-03 DIAGNOSIS — J45909 Unspecified asthma, uncomplicated: Secondary | ICD-10-CM | POA: Diagnosis present

## 2012-08-03 DIAGNOSIS — R531 Weakness: Secondary | ICD-10-CM | POA: Diagnosis present

## 2012-08-03 DIAGNOSIS — R55 Syncope and collapse: Principal | ICD-10-CM

## 2012-08-03 DIAGNOSIS — Z794 Long term (current) use of insulin: Secondary | ICD-10-CM

## 2012-08-03 DIAGNOSIS — G4733 Obstructive sleep apnea (adult) (pediatric): Secondary | ICD-10-CM | POA: Diagnosis present

## 2012-08-03 DIAGNOSIS — E876 Hypokalemia: Secondary | ICD-10-CM | POA: Diagnosis present

## 2012-08-03 DIAGNOSIS — E119 Type 2 diabetes mellitus without complications: Secondary | ICD-10-CM | POA: Diagnosis present

## 2012-08-03 DIAGNOSIS — Z833 Family history of diabetes mellitus: Secondary | ICD-10-CM

## 2012-08-03 DIAGNOSIS — Z86711 Personal history of pulmonary embolism: Secondary | ICD-10-CM

## 2012-08-03 DIAGNOSIS — D649 Anemia, unspecified: Secondary | ICD-10-CM

## 2012-08-03 DIAGNOSIS — IMO0001 Reserved for inherently not codable concepts without codable children: Secondary | ICD-10-CM | POA: Diagnosis present

## 2012-08-03 DIAGNOSIS — R739 Hyperglycemia, unspecified: Secondary | ICD-10-CM

## 2012-08-03 DIAGNOSIS — Z7901 Long term (current) use of anticoagulants: Secondary | ICD-10-CM

## 2012-08-03 DIAGNOSIS — I6782 Cerebral ischemia: Secondary | ICD-10-CM | POA: Insufficient documentation

## 2012-08-03 DIAGNOSIS — I82509 Chronic embolism and thrombosis of unspecified deep veins of unspecified lower extremity: Secondary | ICD-10-CM | POA: Diagnosis present

## 2012-08-03 DIAGNOSIS — G459 Transient cerebral ischemic attack, unspecified: Secondary | ICD-10-CM | POA: Diagnosis present

## 2012-08-03 HISTORY — DX: Personal history of diseases of the blood and blood-forming organs and certain disorders involving the immune mechanism: Z86.2

## 2012-08-03 HISTORY — DX: Hyperlipidemia, unspecified: E78.5

## 2012-08-03 LAB — COMPREHENSIVE METABOLIC PANEL
AST: 15 U/L (ref 0–37)
Albumin: 3.6 g/dL (ref 3.5–5.2)
Alkaline Phosphatase: 54 U/L (ref 39–117)
Chloride: 99 mEq/L (ref 96–112)
Potassium: 3 mEq/L — ABNORMAL LOW (ref 3.5–5.1)
Total Bilirubin: 0.3 mg/dL (ref 0.3–1.2)
Total Protein: 7.6 g/dL (ref 6.0–8.3)

## 2012-08-03 LAB — APTT: aPTT: 27 seconds (ref 24–37)

## 2012-08-03 LAB — RAPID URINE DRUG SCREEN, HOSP PERFORMED
Amphetamines: NOT DETECTED
Barbiturates: NOT DETECTED
Benzodiazepines: NOT DETECTED
Cocaine: NOT DETECTED
Tetrahydrocannabinol: NOT DETECTED

## 2012-08-03 LAB — DIFFERENTIAL
Basophils Absolute: 0 10*3/uL (ref 0.0–0.1)
Basophils Relative: 0 % (ref 0–1)
Eosinophils Absolute: 0.1 10*3/uL (ref 0.0–0.7)
Neutro Abs: 5.3 10*3/uL (ref 1.7–7.7)
Neutrophils Relative %: 53 % (ref 43–77)

## 2012-08-03 LAB — URINALYSIS, ROUTINE W REFLEX MICROSCOPIC
Bilirubin Urine: NEGATIVE
Ketones, ur: 40 mg/dL — AB
Nitrite: NEGATIVE
Protein, ur: NEGATIVE mg/dL
Specific Gravity, Urine: 1.041 — ABNORMAL HIGH (ref 1.005–1.030)
Urobilinogen, UA: 0.2 mg/dL (ref 0.0–1.0)

## 2012-08-03 LAB — POCT I-STAT, CHEM 8
BUN: 8 mg/dL (ref 6–23)
Chloride: 101 mEq/L (ref 96–112)
Creatinine, Ser: 0.8 mg/dL (ref 0.50–1.10)
Glucose, Bld: 251 mg/dL — ABNORMAL HIGH (ref 70–99)
HCT: 40 % (ref 36.0–46.0)
Potassium: 3.1 mEq/L — ABNORMAL LOW (ref 3.5–5.1)

## 2012-08-03 LAB — CK TOTAL AND CKMB (NOT AT ARMC): CK, MB: 1.9 ng/mL (ref 0.3–4.0)

## 2012-08-03 LAB — PROTIME-INR
INR: 1.16 (ref 0.00–1.49)
Prothrombin Time: 15 seconds (ref 11.6–15.2)

## 2012-08-03 LAB — CBC
MCHC: 33.4 g/dL (ref 30.0–36.0)
Platelets: 304 10*3/uL (ref 150–400)
RDW: 14.3 % (ref 11.5–15.5)

## 2012-08-03 LAB — GLUCOSE, CAPILLARY: Glucose-Capillary: 195 mg/dL — ABNORMAL HIGH (ref 70–99)

## 2012-08-03 LAB — CARDIAC PANEL(CRET KIN+CKTOT+MB+TROPI): CK, MB: 1.7 ng/mL (ref 0.3–4.0)

## 2012-08-03 MED ORDER — RIVAROXABAN 20 MG PO TABS
20.0000 mg | ORAL_TABLET | Freq: Every day | ORAL | Status: DC
  Administered 2012-08-03 – 2012-08-04 (×2): 20 mg via ORAL
  Filled 2012-08-03 (×2): qty 1

## 2012-08-03 MED ORDER — INSULIN ASPART 100 UNIT/ML ~~LOC~~ SOLN
6.0000 [IU] | Freq: Three times a day (TID) | SUBCUTANEOUS | Status: DC
  Administered 2012-08-04 – 2012-08-05 (×3): 6 [IU] via SUBCUTANEOUS

## 2012-08-03 MED ORDER — POTASSIUM CHLORIDE 20 MEQ/15ML (10%) PO LIQD
40.0000 meq | Freq: Once | ORAL | Status: AC
  Administered 2012-08-03: 40 meq via ORAL
  Filled 2012-08-03: qty 30

## 2012-08-03 MED ORDER — ALBUTEROL SULFATE HFA 108 (90 BASE) MCG/ACT IN AERS
1.0000 | INHALATION_SPRAY | Freq: Four times a day (QID) | RESPIRATORY_TRACT | Status: DC | PRN
  Filled 2012-08-03: qty 6.7

## 2012-08-03 MED ORDER — SODIUM CHLORIDE 0.9 % IV SOLN
INTRAVENOUS | Status: DC
  Administered 2012-08-03: 23:00:00 via INTRAVENOUS
  Administered 2012-08-04: 10 mL/h via INTRAVENOUS

## 2012-08-03 MED ORDER — INSULIN GLARGINE 100 UNIT/ML ~~LOC~~ SOLN
40.0000 [IU] | Freq: Every day | SUBCUTANEOUS | Status: DC
  Administered 2012-08-03: 40 [IU] via SUBCUTANEOUS

## 2012-08-03 MED ORDER — ACETAMINOPHEN 325 MG PO TABS
650.0000 mg | ORAL_TABLET | Freq: Four times a day (QID) | ORAL | Status: DC | PRN
  Administered 2012-08-04 – 2012-08-05 (×2): 650 mg via ORAL
  Filled 2012-08-03 (×2): qty 2

## 2012-08-03 MED ORDER — ONDANSETRON HCL 4 MG/2ML IJ SOLN: 4.0000 mg | Freq: Three times a day (TID) | INTRAMUSCULAR | Status: AC | PRN

## 2012-08-03 MED ORDER — ALBUTEROL 90 MCG/ACT IN AERS: 1.0000 | INHALATION_SPRAY | Freq: Four times a day (QID) | RESPIRATORY_TRACT | Status: DC | PRN

## 2012-08-03 MED ORDER — ONDANSETRON HCL 4 MG PO TABS: 4.0000 mg | ORAL_TABLET | Freq: Three times a day (TID) | ORAL | Status: DC | PRN

## 2012-08-03 MED ORDER — POTASSIUM CHLORIDE CRYS ER 20 MEQ PO TBCR
40.0000 meq | EXTENDED_RELEASE_TABLET | Freq: Once | ORAL | Status: AC
  Administered 2012-08-03: 40 meq via ORAL
  Filled 2012-08-03: qty 2

## 2012-08-03 MED ORDER — POTASSIUM CHLORIDE 20 MEQ PO PACK: 40.0000 meq | PACK | Freq: Once | ORAL | Status: DC

## 2012-08-03 NOTE — ED Provider Notes (Signed)
Medical screening examination/treatment/procedure(s) were performed by non-physician practitioner and as supervising physician I was immediately available for consultation/collaboration.  Flint Melter, MD 08/03/12 [phone]

## 2012-08-03 NOTE — Consult Note (Signed)
Chief Complaint: Numbness and weakness involving left side.  HPI: Makayla Frazier is an 41 y.o. female history of hypertension hyperlipidemia diabetes mellitus obesity obstructive sleep apnea and deep vein thrombosis on Xarelto, presenting with history of episode of loss of consciousness at 1340 this afternoon followed by experiencing weakness and numbness involving left face arm and leg on waking up. No seizure activity was reported. There was no postictal confusion reported. Strength has improved since onset. She's still experiencing numbness on the left side. Has no previous history of stroke nor TIA. NIH stroke score was 2 for sensory changes. CT scan of her head showed no acute intracranial abnormality.  LSN: 1340 today tPA Given: No: On Xarelto; minimal deficit. MRankin: 1  Past Medical History  Diagnosis Date  . PE (pulmonary embolism)     2009 - on oral contraception  . Syncopal episodes     unknown etiology  . DM (diabetes mellitus)   . Dysfunctional uterine bleeding     Seen by Dr. Pennie Rushing, GYN  . Asthma   . OSA (obstructive sleep apnea)   . Major depression     Hx suicide attempt in 2009, [facility] admissions  . Syncope and collapse     unknow etiology extensive w/u cardiology  . Obesity   . Mastodynia   . Hypertension   . Post - coital bleeding 2012  . Labial lesion 2013  . BV (bacterial vaginosis) 2012  . Oligomenorrhea 2011  . DVT (deep venous thrombosis) 2011    Family History  Problem Relation Age of Onset  . Melanoma Father 15  . Ulcerative colitis Mother 5  . Breast cancer Paternal Grandmother   . Diabetes type II Maternal Grandmother     deceased 81  . Pulmonary embolism Paternal Grandfather      Medications:  Prior to Admission:  Albuterol inhaler 2 puffs every 4-6 hours when necessary wheezing or shortness of breath Vitamin D2 50,000 units twice a week NovoLog 6 units 3 times per day when necessary blood sugar greater than 200 Lantus 40 units at  bedtime Lisinopril 20 mg per day Xarelto 20 mg per day  Physical Examination: Blood pressure 123/76, pulse 88, temperature 98.1 F (36.7 C), temperature source Oral, resp. rate 18, height 5' (1.524 m), weight 77.111 kg (170 lb), last menstrual period 07/02/2012, SpO2 99.00%.  Neurologic Examination: Mental Status: Alert, oriented, thought content appropriate.  Speech fluent without evidence of aphasia. Able to follow commands without difficulty. Cranial Nerves: II-Visual fields were normal. III/IV/VI-Pupils were equal and reacted. Extraocular movements were full and conjugate.    V/VII-no facial numbness and no facial weakness. VIII-normal. X-normal speech and symmetrical palatal movement. XII-midline tongue extension Motor: 5/5 bilaterally with normal tone and bulk Sensory: Reduced perception of tactile sensation to touch and pinprick over left face arm and leg compared to right side. Deep Tendon Reflexes: 2+ and symmetric. Plantars: Flexor bilaterally Cerebellar: Normal finger-to-nose testing. Carotid auscultation: Normal   Ct Head Wo Contrast  08/03/2012  *RADIOLOGY REPORT*  Clinical Data: Code stroke.  Syncopal episode.  Headache and left- sided weakness. Diabetes mellitus type 2.  CT HEAD WITHOUT CONTRAST  Technique:  Contiguous axial images were obtained from the base of the skull through the vertex without contrast.  Comparison: 05/18/2012.  Findings: There is no evidence for acute infarction, intracranial hemorrhage, mass lesion, hydrocephalus, or extra-axial fluid.  Mild premature atrophy affects the cerebral cortex and cerebellum. No significant white matter disease.  No chronic lacunar infarction. The calvarium  is intact.  Clear sinuses and mastoids.  Incidental dural calcification right greater than left middle cranial fossa. Similar appearance to priors.  IMPRESSION: Mild atrophy.  No visible acute intracranial abnormality.  Critical Value/emergent results were called by  telephone at the time of interpretation on 08/03/2012 at 2:45 p.m. to Dr. Weldon Inches, who verbally acknowledged these results.   Original Report Authenticated By: Elsie Stain, M.D.     Assessment: 41 y.o. female presenting with possible right cerebral TIA with syncopal episode at the onset. Subcortical right CVA cannot be ruled out. No indications of new-onset seizure disorder per her description of patient's episode of loss of consciousness and mental status on waking up.  Stroke Risk Factors - diabetes mellitus, hyperlipidemia and hypertension  Plan: 1. HgbA1c, fasting lipid panel 2. MRI, MRA  of the brain without contrast 3. PT consult, OT consult, Speech consult 4. Echocardiogram 5. Carotid dopplers 6. Prophylactic therapy-Xarelto 20 mg per day 7. Risk factor modification 8. Telemetry monitoring 9. Routine EEG on 08/05/2012; may be done as an outpatient the patient has been discharged by 08/05/2012.  C.R. Roseanne Reno, MD [facility] [phone] 08/03/2012, 3:04 PM

## 2012-08-03 NOTE — ED Notes (Signed)
Pt to room 13, alert and oriented, boyfriend at bedside.  Pt states she got too hot at picnic right before she had syncopal episode.  No pain no shob. Hand grips strong and equal bilat. Able to move all extrm without any problem

## 2012-08-03 NOTE — Code Documentation (Signed)
Code stroke called at 1406, patient arrived to [facility] via EMS at 1429, EDP seen at 55, stroke team at 57, CT scan at 1435, lab at 48.  LSN 1340, patient was at the park having a picnic with family and had a syncopal episode and then developed left side weakness.  NIHSS 2. Cancelled at [employer]

## 2012-08-03 NOTE — H&P (Signed)
Hospital Admission Note Date: 08/03/2012  Patient name: Makayla Frazier Medical record number: [id number] Date of birth: [birth year] Age: 41 y.o. Gender: female PCP: Windy Carina, PA-C  Medical Service: Internal Medicine  Attending physician:     1st Contact: Makayla Maxim MD [phone] 2nd Contact: Dr. Tonny Branch Pager:[phone] After 5 pm or weekends: 1st Contact:  Pager: [phone] 2nd Contact:  Pager: [phone]  Chief Complaint: syncope, left sided weakness/numbness, chest pain  History of Present Illness: 41 y.o woman presents after recurrent unprovoked syncopal episode.  Last syncopal episode was 05/18/12.  She had witnessed LOC between 1-2 pm this afternoon (per family for 4-5 minutes) where she felt hot at first then passed out at a family gathering.  She denies feeling unusual this am except for waking up this morning feeling tired and feeling "hot" since leaving the house today. Her syncopal episode was associated with weakness and numbness involving the left face, arm, leg. She states left leg weakness feels like when she gets she gets a clot but reports taking her Xarelto as instructed. No seizure was reported though family mentions her right hand tremor when she was down.  No postictal state reported though family mentions that the patient was disoriented after this episode and they remember her saying "get the inhaler off the dresser".  Family also mentions slight drool out of the right side of her mouth.  The patient remembers EMS talking to her.  Currently, the patient feels okay.  She did eat breakfast this am (bagel) and reports taking her medication at night.  CT was negative for acute changes, mild atrophy.    ROS:  General +feels cold all the time HEENT: h/o left eye blurriness CV: +cp with exertion, +mid to left cp (not new) feels like tightness worse with cold drink; loop recorder in left chest being removed in 08/2012 Lungs:+sob w/ exertion and at night Neuro:+unprovoked  syncope last episode 05/18/12, h/o drooling right side of mouth today with syncope, +left arm/leg weakness, h/o left leg weakness with walking;  GU: IUD in place per pt will be removed 08/2012   SH: denies smoking; etoh; drugs; 10 grade education; lives with significant other. PCP is Makayla Frazier. She has been seeing her x 3-4 months   Meds: Current Outpatient Rx  Name Route Sig Dispense Refill  . ALBUTEROL 90 MCG/ACT IN AERS  1-2 puffs every 4-6 hours as needed for wheezing or shortness of breath     . ERGOCALCIFEROL 50000 UNITS PO CAPS Oral Take 50,000 Units by mouth 2 (two) times a week. Take twice a week per patient    . INSULIN ASPART 100 UNIT/ML [place] SOLN Subcutaneous Inject 6 Units into the skin 3 (three) times daily as needed. For blood sugar over 200 at meal time    . INSULIN GLARGINE 100 UNIT/ML  SOLN Subcutaneous Inject 40 Units into the skin at bedtime.     Marland Kitchen LISINOPRIL 20 MG PO TABS Oral Take 20 mg by mouth daily.    Marland Kitchen RIVAROXABAN 10 MG PO TABS Oral Take 20 mg by mouth daily.     Patient reported also taking Ultram prn for pain but makes her feel nauseated  Pt reports taking Lisinopril-HCTZ 10-12.5 qd for HTN  Pt reports taking 46 units Lantus qhs Pt reports taking 6 units before meals of Novolog for fsbs >200   Allergies: Allergies as of 08/03/2012 - Review Complete 08/03/2012  Allergen Reaction Noted  . Codeine  05/18/2012  . Darvocet (propoxyphene-acetaminophen)  05/18/2012  . Hydrocodone-acetaminophen  01/16/2008  . Latex  08/30/2011  . Oxycodone-acetaminophen  07/07/2008  . Percocet (oxycodone-acetaminophen)  05/18/2012   Past Medical History  Diagnosis Date  . PE (pulmonary embolism)     2009 - on oral contraception; multiple  . Syncopal episodes     unknown etiology  . DM (diabetes mellitus)   . Dysfunctional uterine bleeding     Seen by Dr. Pennie Rushing, GYN; pending hysterectomy as of 07/2012  . Asthma   . OSA (obstructive sleep apnea)   . Major depression     Hx  suicide attempt in 2009, [facility] admissions  . Syncope and collapse     unknow etiology extensive w/u cardiology  . Obesity   . Mastodynia   . Hypertension   . Post - coital bleeding 2012  . Labial lesion 2013  . BV (bacterial vaginosis) 2012  . Oligomenorrhea 2011  . DVT (deep venous thrombosis) 2011    pt had IUD; also 05/2012  . H/O hypercoagulable state     2/2 contraception  . HLD (hyperlipidemia)    Past Surgical History  Procedure Date  . Breast reduction surgery     Dr. Kathie Dike [facility] 2011  . Cholecystectomy   . Cardiac electrophysiology study & dft     Implantable Loop recorder  . Endometrial biopsy 2011   Family History  Problem Relation Age of Onset  . Melanoma Father 66  . Ulcerative colitis Mother 32  . Breast cancer Paternal Grandmother   . Diabetes type II Maternal Grandmother     deceased 63  . Pulmonary embolism Paternal Grandfather    History   Social History  . Marital Status: Single    Spouse Name: N/A    Number of Children: N/A  . Years of Education: N/A   Occupational History  . 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 Smoking status: Never Smoker   . Smokeless tobacco: Never Used  . Alcohol Use: No  . Drug Use: No  . Sexually Active: Yes    Birth Control/ Protection: IUD     Mirena    Other Topics Concern  . Not on file   Social History Narrative   [place] other, WilliamCompleted 10th grade.Worker Compensation Case 2009-2012 related to PE    Review of Systems: General: denies fever, feels cold all the time HEENT: h/o h/a no h/a currently (last h/a was last week); improved runny nose; denies vision changes currently but h/o left eye blurriness CV: +cp with exertion, +mid to left cp (not new) feels like tightness worse with cold drink; loop recorder in left chest being removed in 08/2012; denies palpitations Lung:+sob w/ exertion and at night Ab: denies  diarrhea Neuro: +unprovoked syncope last episode 05/18/12, h/o drooling right side of mouth today with syncope, +left arm/leg weakness, h/o left leg weakness with walking; denies dizziness/lightheadedness/sensation of the room spinning/bowel or bladder incontinence GU: IUD in place per pt will be removed 08/2012   Physical Exam: VS 78/18/110/61(68)  Blood pressure 123/86, pulse 83, temperature 98.1 F (36.7 C), temperature source Oral, resp. rate 20, height 5' (1.524 m), weight 170 lb (77.111 kg), last menstrual period 07/02/2012, SpO2 100.00%. Vitals reviewed. General: resting in bed, NAD HEENT: PERRL b/l, no scleral icterus Cardiac: RRR, no rubs, murmurs or gallops, left chest with loop recorder intact no evidence of infection Pulm: clear to auscultation bilaterally, no wheezes, rales,  or rhonchi Abd: soft, min. TTP RLQ, nondistended, BS present (normal) Ext: warm and well perfused, no pedal edema Neuro: alert and oriented X3, CN 2-12 grossly intact, 5/5 strength RUE, RLE, 4+/5 strength LUE/LLE/left hand grip; left plantar flexion decreased possibly 2/2 decreased pt effort; decreased sensation V1-V3 left face, left upper extremity, left lower extremity  Lab results: Basic Metabolic Panel:  Basename 08/03/12 1445 08/03/12 1428  NA 138 134*  K 3.1* 3.0*  CL 101 99  CO2 -- 24  GLUCOSE 251* 250*  BUN 8 10  CREATININE 0.80 0.66  CALCIUM -- 9.2  MG -- --  PHOS -- --   Liver Function Tests:  Basename 08/03/12 1428  AST 15  ALT 19  ALKPHOS 54  BILITOT 0.3  PROT 7.6  ALBUMIN 3.6   CBC:  Basename 08/03/12 1445 08/03/12 1428  WBC -- 9.9  NEUTROABS -- 5.3  HGB 13.6 12.3  HCT 40.0 36.8  MCV -- 77.5*  PLT -- 304   Cardiac Enzymes:  Basename 08/03/12 1431  CKTOTAL 171  CKMB 1.9  CKMBINDEX --  TROPONINI <0.30   Coagulation:  Basename 08/03/12 1428  LABPROT 15.0  INR 1.16   Misc. Labs: none  Imaging results:  Ct Head Wo Contrast  08/03/2012  *RADIOLOGY REPORT*   Clinical Data: Code stroke.  Syncopal episode.  Headache and left- sided weakness. Diabetes mellitus type 2.  CT HEAD WITHOUT CONTRAST  Technique:  Contiguous axial images were obtained from the base of the skull through the vertex without contrast.  Comparison: 05/18/2012.  Findings: There is no evidence for acute infarction, intracranial hemorrhage, mass lesion, hydrocephalus, or extra-axial fluid.  Mild premature atrophy affects the cerebral cortex and cerebellum. No significant white matter disease.  No chronic lacunar infarction. The calvarium is intact.  Clear sinuses and mastoids.  Incidental dural calcification right greater than left middle cranial fossa. Similar appearance to priors.  IMPRESSION: Mild atrophy.  No visible acute intracranial abnormality.  Critical Value/emergent results were called by telephone at the time of interpretation on 08/03/2012 at 2:45 p.m. to Dr. Weldon Inches, who verbally acknowledged these results.   Original Report Authenticated By: Elsie Stain, M.D.    Mr Angiogram Head Wo Contrast  08/03/2012  *RADIOLOGY REPORT*  Clinical Data:  Left-sided numbness and weakness.  Diabetes mellitus.  Unspecified syncopal episodes.  Obesity. Suspect acute but ill-defined cerebrovascular disease.  MRI HEAD WITHOUT CONTRAST MRA HEAD WITHOUT CONTRAST  Technique:  Multiplanar, multiecho pulse sequences of the brain and surrounding structures were obtained without intravenous contrast. Angiographic images of the head were obtained using MRA technique without contrast.  Comparison:  CT head earlier in the day.  MRI 03/18/2009.  MRI HEAD  Findings:  There is no evidence for acute infarction, intracranial hemorrhage, mass lesion, hydrocephalus, or extra-axial fluid. Premature cerebral atrophy is present with prominence of the extracerebral CSF spaces, particularly over the convexity.  Mild cerebellar atrophy is also noted.  No significant white matter disease is seen.  No foci of chronic  hemorrhage. Pituitary shows mild empty sella.  No cerebellar tonsillar herniation.  Upper cervical region unremarkable.  Negative orbits, sinuses, and mastoids.  IMPRESSION: Premature atrophy without acute intracranial findings. Similar appearance to MRI of 2010.  MRA HEAD  Findings: Widely patent carotid, vertebral, and basilar arteries. Fetal origin left PCA.  No intracranial stenosis or aneurysm.  IMPRESSION: Negative exam.   Original Report Authenticated By: Elsie Stain, M.D.    Mr Brain Wo Contrast  08/03/2012  *  RADIOLOGY REPORT*  Clinical Data:  Left-sided numbness and weakness.  Diabetes mellitus.  Unspecified syncopal episodes.  Obesity. Suspect acute but ill-defined cerebrovascular disease.  MRI HEAD WITHOUT CONTRAST MRA HEAD WITHOUT CONTRAST  Technique:  Multiplanar, multiecho pulse sequences of the brain and surrounding structures were obtained without intravenous contrast. Angiographic images of the head were obtained using MRA technique without contrast.  Comparison:  CT head earlier in the day.  MRI 03/18/2009.  MRI HEAD  Findings:  There is no evidence for acute infarction, intracranial hemorrhage, mass lesion, hydrocephalus, or extra-axial fluid. Premature cerebral atrophy is present with prominence of the extracerebral CSF spaces, particularly over the convexity.  Mild cerebellar atrophy is also noted.  No significant white matter disease is seen.  No foci of chronic hemorrhage. Pituitary shows mild empty sella.  No cerebellar tonsillar herniation.  Upper cervical region unremarkable.  Negative orbits, sinuses, and mastoids.  IMPRESSION: Premature atrophy without acute intracranial findings. Similar appearance to MRI of 2010.  MRA HEAD  Findings: Widely patent carotid, vertebral, and basilar arteries. Fetal origin left PCA.  No intracranial stenosis or aneurysm.  IMPRESSION: Negative exam.   Original Report Authenticated By: Elsie Stain, M.D.     Other results: EKG: with NSR    Assessment & Plan by Problem: 41 y.o woman significant PMH presents after syncopal episode associated with left sided weakness and numbness and complaints of chest pain.   1. Syncope -multiple unprovoked episodes of syncope in the past, last 05/18/12 -today associated with left sided weakness (arm/leg), decreased sensation left side of body -unclear etiology to date, associated with pt feeling hot -w/u for neuro (possible TIA) vs cardiac etiology -CT and MRI/A negative  -orthostatic VS pending, NSR EKG -pt has loop monitor intact-will call EP tomorrow to interrogate -Neuro (Dr. Roseanne Reno) consulted considered possible right cerebral TIA (transient ischemic attack) - pending HA1C, lipid, PT/OT/Speech, echo/US dopplers, EEG on 8/26 inpt or outpatient per Dr. Roseanne Reno -pending UDS  2. Chest pain -CE neg x 1 -cycle CE to r/o ACS -EKG NSR -pt has loop monitor intact-will call EP tomorrow to interrogate  3. DM 2 -pending HA1C, elevated fsbs this admission -restarted home Lantus 40 units and Novolog 6 units w/ meals  -cbg qid w/ meals    4. HTN -h/o HTN per pt taking Lisinopril-HCTZ 10-12.5 but it states PTA meds taking Lisinopril only -will hold antiHTN for now in setting of w/u for CC syncope  5. Asthma -Albuterol inhaler prn  6. History of DVT/PE, multiple -related to OCP use for dysfunctional uterine bleeding, pt still has IUD intact -last DVT 05/2012 -pt taking Xarelto-restarted this admission  7. F/E/N -NS 75 cc/hr -will monitor electrolytes prn K replaced 40 meq x 2 today  -diet ordered after pt passed swallow  8. DVT Px -Xarelto  9. Dispo -will admit for further w/u -home pending w/u   Signed: Annett Gula [phone] 08/03/2012, 7:29 PM

## 2012-08-03 NOTE — ED Notes (Signed)
Pt states she wears glasses. Left eye 20/400   Right eye 20/100  Both eyes 20/100  Pt did not bring glasses with her.

## 2012-08-03 NOTE — ED Notes (Signed)
Pt was at outdoor picnic, had syncopal episode and then complained of left sided weakness

## 2012-08-03 NOTE — ED Provider Notes (Signed)
History     CSN: [id number]  Arrival date & time 08/03/12  1429   First MD Initiated Contact with Patient 08/03/12 1504      Chief Complaint  Patient presents with  . Code Stroke    (Consider location/radiation/quality/duration/timing/severity/associated sxs/prior treatment) HPI Comments: Patient with history of pulmonary embolism on Xarelto, diabetes, depression -- presents after a syncopal episode that occurred just prior to arrival. Patient states that she felt hot prior to passing out. When she awoke, the patient states that she felt that her left arm and leg were weak and that the vision in her left eye was blurry. Code stroke was called. Patient was evaluated upon arrival to emergency department by Dr. Roseanne Reno who canceled code stroke. Patient is not a TPA candidate due to anticoagulation. CT scan was negative. Patient continues to complain of left arm and left leg weakness, blurry vision in left eye. She denies chest pain, shortness of breath, nausea, vomiting, abdominal pain, diarrhea, constipation, urinary symptoms. She states that she has been compliant with the Xarelto. She denies any recent blood in her stool, vaginal bleeding. Patient states that she has remained well-hydrated. Nothing makes her symptoms better or worse.  Patient is a 41 y.o. female presenting with syncope. The history is provided by the patient.  Loss of Consciousness This is a new problem. The current episode started today. The problem has been resolved. Associated symptoms include a visual change and weakness. Pertinent negatives include no abdominal pain, chest pain, coughing, fever, headaches, myalgias, nausea, numbness, rash, sore throat or vomiting. Nothing aggravates the symptoms. She has tried nothing for the symptoms.    Past Medical History  Diagnosis Date  . PE (pulmonary embolism)     2009 - on oral contraception  . Syncopal episodes     unknown etiology  . DM (diabetes mellitus)   .  Dysfunctional uterine bleeding     Seen by Dr. Pennie Rushing, GYN  . Asthma   . OSA (obstructive sleep apnea)   . Major depression     Hx suicide attempt in 2009, [facility] admissions  . Syncope and collapse     unknow etiology extensive w/u cardiology  . Obesity   . Mastodynia   . Hypertension   . Post - coital bleeding 2012  . Labial lesion 2013  . BV (bacterial vaginosis) 2012  . Oligomenorrhea 2011  . DVT (deep venous thrombosis) 2011    Past Surgical History  Procedure Date  . Breast reduction surgery     Dr. Kathie Dike [facility] 2011  . Cholecystectomy   . Cardiac electrophysiology study & dft     Implantable Loop recorder  . Endometrial biopsy 2011    Family History  Problem Relation Age of Onset  . Melanoma Father 14  . Ulcerative colitis Mother 32  . Breast cancer Paternal Grandmother   . Diabetes type II Maternal Grandmother     deceased 77  . Pulmonary embolism Paternal Grandfather     History  Substance Use Topics  . Smoking status: Never Smoker   . Smokeless tobacco: Never Used  . Alcohol Use: No    OB History    Grav Para Term Preterm Abortions TAB SAB Ect Mult Living   0 0              Review of Systems  Constitutional: Negative for fever.  HENT: Negative for sore throat and rhinorrhea.   Eyes: Negative for redness.  Respiratory: Negative for cough.   Cardiovascular: Positive  for syncope. Negative for chest pain, palpitations and leg swelling.  Gastrointestinal: Negative for nausea, vomiting, abdominal pain, diarrhea and blood in stool.  Genitourinary: Negative for dysuria and vaginal bleeding.  Musculoskeletal: Negative for myalgias.  Skin: Negative for rash.  Neurological: Positive for syncope and weakness. Negative for dizziness, tremors, seizures, facial asymmetry, speech difficulty, numbness and headaches.  Psychiatric/Behavioral: Negative for confusion.    Allergies  Codeine; Darvocet; Hydrocodone-acetaminophen; Latex; Oxycodone-acetaminophen; and  Percocet  Home Medications   Current Outpatient Rx  Name Route Sig Dispense Refill  . ALBUTEROL 90 MCG/ACT IN AERS  1-2 puffs every 4-6 hours as needed for wheezing or shortness of breath     . ERGOCALCIFEROL 50000 UNITS PO CAPS Oral Take 50,000 Units by mouth 2 (two) times a week. Take twice a week per patient    . INSULIN ASPART 100 UNIT/ML [place] SOLN Subcutaneous Inject 6 Units into the skin 3 (three) times daily as needed. For blood sugar over 200 at meal time    . INSULIN GLARGINE 100 UNIT/ML [place] SOLN Subcutaneous Inject 40 Units into the skin at bedtime.     Marland Kitchen LISINOPRIL 20 MG PO TABS Oral Take 20 mg by mouth daily.    Marland Kitchen RIVAROXABAN 10 MG PO TABS Oral Take 20 mg by mouth daily.      BP 123/76  Pulse 88  Temp 98.1 F (36.7 C) (Oral)  Resp 18  Ht 5' (1.524 m)  Wt 170 lb (77.111 kg)  BMI 33.20 kg/m2  SpO2 99%  LMP 07/02/2012  Physical Exam  Nursing note and vitals reviewed. Constitutional: She is oriented to person, place, and time. She appears well-developed and well-nourished.  HENT:  Head: Normocephalic and atraumatic.  Right Ear: External ear normal.  Left Ear: External ear normal.  Nose: Nose normal.  Mouth/Throat: Oropharynx is clear and moist.  Eyes: Conjunctivae are normal. Pupils are equal, round, and reactive to light. Right eye exhibits no discharge. Left eye exhibits no discharge.       3mm pupils bilaterally.   Neck: Normal range of motion. Neck supple.  Cardiovascular: Normal rate, regular rhythm and normal heart sounds.   No murmur heard. Pulmonary/Chest: Effort normal and breath sounds normal. No respiratory distress. She has no wheezes. She has no rales.  Abdominal: Soft. Bowel sounds are normal. There is no tenderness. There is no rebound and no guarding.  Neurological: She is alert and oriented to person, place, and time. She has normal reflexes. No cranial nerve deficit or sensory deficit. She exhibits normal muscle tone. She displays a negative Romberg  sign. GCS eye subscore is 4. GCS verbal subscore is 5. GCS motor subscore is 6.       4+/5 strength in entire L upper extremity, L lower extremity. Cranial nerves II-XII grossly intact.   Skin: Skin is warm and dry.  Psychiatric: She has a normal mood and affect.    ED Course  Procedures (including critical care time)  Labs Reviewed  CBC - Abnormal; Notable for the following:    MCV 77.5 (*)     MCH 25.9 (*)     All other components within normal limits  COMPREHENSIVE METABOLIC PANEL - Abnormal; Notable for the following:    Sodium 134 (*)     Potassium 3.0 (*)     Glucose, Bld 250 (*)     All other components within normal limits  POCT I-STAT, CHEM 8 - Abnormal; Notable for the following:    Potassium 3.1 (*)  Glucose, Bld 251 (*)     All other components within normal limits  PROTIME-INR  APTT  DIFFERENTIAL  CK TOTAL AND CKMB  TROPONIN I   Ct Head Wo Contrast  08/03/2012  *RADIOLOGY REPORT*  Clinical Data: Code stroke.  Syncopal episode.  Headache and left- sided weakness. Diabetes mellitus type 2.  CT HEAD WITHOUT CONTRAST  Technique:  Contiguous axial images were obtained from the base of the skull through the vertex without contrast.  Comparison: 05/18/2012.  Findings: There is no evidence for acute infarction, intracranial hemorrhage, mass lesion, hydrocephalus, or extra-axial fluid.  Mild premature atrophy affects the cerebral cortex and cerebellum. No significant white matter disease.  No chronic lacunar infarction. The calvarium is intact.  Clear sinuses and mastoids.  Incidental dural calcification right greater than left middle cranial fossa. Similar appearance to priors.  IMPRESSION: Mild atrophy.  No visible acute intracranial abnormality.  Critical Value/emergent results were called by telephone at the time of interpretation on 08/03/2012 at 2:45 p.m. to Dr. Weldon Inches, who verbally acknowledged these results.   Original Report Authenticated By: Elsie Stain, M.D.       1. TIA (transient ischemic attack)     3:15 PM Patient seen and examined. Code stroke canceled prior. Labs pending. CT performed and is negative for findings.   Vital signs reviewed and are as follows: Filed Vitals:   08/03/12 1452  BP: 123/76  Pulse: 88  Temp: 98.1 F (36.7 C)  Resp: 18    Date: 08/03/2012  Rate: 71  Rhythm: normal sinus rhythm  QRS Axis: normal  Intervals: normal  ST/T Wave abnormalities: normal  Conduction Disutrbances:none  Narrative Interpretation:   Old EKG Reviewed: changes noted since 05/26/2012, improvement in t-wave inversions  5:04 PM Dr. Effie Shy has seen patient. I've spoken with IM teaching service and FM teaching service regarding who will admit. Re-paging IM teaching service.   5:15 PM Internal medicine teaching service to admit. Temp ordered completed.   MDM  Admit TIA work-up.         Renne Crigler, [place] 08/03/12 1718

## 2012-08-03 NOTE — ED Provider Notes (Signed)
Makayla Frazier is a 41 y.o. female who had syncope with prodrome of weakness. No postictal state witnessed by bystanders. She has mild residual paresthesias in the feet, and still feels weak. She has equal, and symmetric movement of her arms, and legs.  Medical screening examination/treatment/procedure(s) were conducted as a shared visit with non-physician practitioner(s) and myself.  I personally evaluated the patient during the encounter  Flint Melter, MD 08/03/12 [phone]

## 2012-08-04 ENCOUNTER — Encounter (HOSPITAL_COMMUNITY): Payer: Self-pay | Admitting: *Deleted

## 2012-08-04 DIAGNOSIS — D649 Anemia, unspecified: Secondary | ICD-10-CM

## 2012-08-04 DIAGNOSIS — R55 Syncope and collapse: Secondary | ICD-10-CM

## 2012-08-04 DIAGNOSIS — R079 Chest pain, unspecified: Secondary | ICD-10-CM

## 2012-08-04 DIAGNOSIS — R7309 Other abnormal glucose: Secondary | ICD-10-CM

## 2012-08-04 DIAGNOSIS — G459 Transient cerebral ischemic attack, unspecified: Secondary | ICD-10-CM

## 2012-08-04 DIAGNOSIS — E876 Hypokalemia: Secondary | ICD-10-CM

## 2012-08-04 LAB — CARDIAC PANEL(CRET KIN+CKTOT+MB+TROPI)
CK, MB: 1.5 ng/mL (ref 0.3–4.0)
CK, MB: 1.5 ng/mL (ref 0.3–4.0)
Troponin I: <0.3 ng/mL (ref <0.30)

## 2012-08-04 LAB — CBC WITH DIFFERENTIAL/PLATELET
Basophils Relative: 0 % (ref 0–1)
Eosinophils Absolute: 0.1 10*3/uL (ref 0.0–0.7)
Eosinophils Relative: 1 % (ref 0–5)
MCH: 25.6 pg — ABNORMAL LOW (ref 26.0–34.0)
MCHC: 32.6 g/dL (ref 30.0–36.0)
MCV: 78.5 fL (ref 78.0–100.0)
Monocytes Relative: 5 % (ref 3–12)
Neutrophils Relative %: 50 % (ref 43–77)
Platelets: 283 10*3/uL (ref 150–400)

## 2012-08-04 LAB — TSH: TSH: 1.056 u[IU]/mL (ref 0.350–4.500)

## 2012-08-04 LAB — GLUCOSE, CAPILLARY
Glucose-Capillary: 127 mg/dL — ABNORMAL HIGH (ref 70–99)
Glucose-Capillary: 225 mg/dL — ABNORMAL HIGH (ref 70–99)
Glucose-Capillary: 221 mg/dL — ABNORMAL HIGH (ref 70–99)
Glucose-Capillary: 125 mg/dL — ABNORMAL HIGH (ref 70–99)

## 2012-08-04 LAB — IRON AND TIBC
TIBC: 267 ug/dL (ref 250–470)
UIBC: 182 ug/dL (ref 125–400)

## 2012-08-04 LAB — BASIC METABOLIC PANEL
BUN: 7 mg/dL (ref 6–23)
Calcium: 8.8 mg/dL (ref 8.4–10.5)
Creatinine, Ser: 0.65 mg/dL (ref 0.50–1.10)
GFR calc Af Amer: >90 mL/min (ref >90)
GFR calc non Af Amer: >90 mL/min (ref >90)
Potassium: 3.9 mEq/L (ref 3.5–5.1)

## 2012-08-04 LAB — MAGNESIUM: Magnesium: 2.1 mg/dL (ref 1.5–2.5)

## 2012-08-04 LAB — RETICULOCYTES: Retic Ct Pct: 1.2 % (ref 0.4–3.1)

## 2012-08-04 LAB — ETHANOL: Alcohol, Ethyl (B): <11 mg/dL (ref 0–11)

## 2012-08-04 LAB — PHOSPHORUS: Phosphorus: 2 mg/dL — ABNORMAL LOW (ref 2.3–4.6)

## 2012-08-04 LAB — LIPID PANEL: LDL Cholesterol: 90 mg/dL (ref 0–99)

## 2012-08-04 MED ORDER — POTASSIUM & SODIUM PHOSPHATES 280-160-250 MG PO PACK
1.0000 | PACK | Freq: Once | ORAL | Status: AC
  Administered 2012-08-04: 1 via ORAL
  Filled 2012-08-04: qty 1

## 2012-08-04 MED ORDER — DIPHENHYDRAMINE HCL 25 MG PO CAPS
25.0000 mg | ORAL_CAPSULE | Freq: Four times a day (QID) | ORAL | Status: DC | PRN
  Administered 2012-08-04 – 2012-08-05 (×2): 25 mg via ORAL
  Filled 2012-08-04 (×2): qty 1

## 2012-08-04 MED ORDER — RIVAROXABAN 20 MG PO TABS
20.0000 mg | ORAL_TABLET | Freq: Every day | ORAL | Status: DC
  Administered 2012-08-05: 20 mg via ORAL
  Filled 2012-08-04: qty 1

## 2012-08-04 MED ORDER — INSULIN GLARGINE 100 UNIT/ML ~~LOC~~ SOLN
46.0000 [IU] | Freq: Every day | SUBCUTANEOUS | Status: DC
  Administered 2012-08-04: 46 [IU] via SUBCUTANEOUS

## 2012-08-04 NOTE — Progress Notes (Signed)
Subjective: Pt c/o low back pain today pt thinks related to pre-menstrual cycle and left eye itching. Otherwise tolerating po. Denies other complaints.   Objective: Vital signs in last 24 hours: Filed Vitals:   08/03/12 2000 08/03/12 2357 08/04/12 0400 08/04/12 0800  BP: 115/76 100/65 110/71 104/70  Pulse: 77 65 76 76  Temp: 98.3 F (36.8 C) 98.3 F (36.8 C) 98.4 F (36.9 C) 97.9 F (36.6 C)  TempSrc:    Oral  Resp: 16 16 16 20   Height: 5' (1.524 m)     Weight: 169 lb 1.5 oz (76.7 kg)     SpO2: 98% 98% 97% 98%   Weight change:   Intake/Output Summary (Last 24 hours) at 08/04/12 1352 Last data filed at 08/04/12 0804  Gross per 24 hour  Intake   1015 ml  Output      0 ml  Net   1015 ml   Vitals reviewed. General: resting in bed, NAD HEENT: [place]/AT; no scleral icterus Cardiac: RRR, no rubs, murmurs or gallops. Loop device intact left chest Pulm: clear to auscultation bilaterally, no wheezes, rales, or rhonchi Abd: soft, mild TTP RLQ, nondistended, BS present (normal) Ext: warm and well perfused, no pedal edema Neuro: alert and oriented X3, CN 2-12 grossly intact; 4+/5 LUE, LLE weakness and left hand grip, decreased sensation left lower ext>right lower ext ; 5/5 strength right upper and right lower extremity   Lab Results: Basic Metabolic Panel:  Lab 08/04/12 4098 08/04/12 0450 08/03/12 1445 08/03/12 1428  NA -- 140 138 --  K -- 3.9 3.1* --  CL -- 107 101 --  CO2 -- 25 -- 24  GLUCOSE -- 165* 251* --  BUN -- 7 8 --  CREATININE -- 0.65 0.80 --  CALCIUM -- 8.8 -- 9.2  MG 2.1 -- -- --  PHOS 2.0* -- -- --   Liver Function Tests:  Lab 08/03/12 1428  AST 15  ALT 19  ALKPHOS 54  BILITOT 0.3  PROT 7.6  ALBUMIN 3.6   CBC:  Lab 08/03/12 1445 08/03/12 1428  WBC -- 9.9  NEUTROABS -- 5.3  HGB 13.6 12.3  HCT 40.0 36.8  MCV -- 77.5*  PLT -- 304   Cardiac Enzymes:  Lab 08/04/12 1023 08/04/12 0421 08/03/12 2040  CKTOTAL 124 115 144  CKMB 1.5 1.5 1.7  CKMBINDEX --  -- --  TROPONINI <0.30 <0.30 <0.30   CBG:  Lab 08/04/12 0729 08/03/12 2026  GLUCAP 127* 195*   Hemoglobin A1C:  Lab 08/03/12 2041  HGBA1C 10.9*   Fasting Lipid Panel:  Lab 08/04/12 0450  CHOL 146  HDL 36*  LDLCALC 90  TRIG 99  CHOLHDL 4.1  LDLDIRECT --   Thyroid Function Tests:  Lab 08/03/12 2041  TSH 1.056  T4TOTAL --  FREET4 --  T3FREE --  THYROIDAB --   Coagulation:  Lab 08/03/12 1428  LABPROT 15.0  INR 1.16   Anemia Panel:  Lab 08/04/12 1023  VITAMINB12 --  FOLATE --  FERRITIN --  TIBC --  IRON --  RETICCTPCT 1.2   Urine Drug Screen: Drugs of Abuse     Component Value Date/Time   LABOPIA NONE DETECTED 08/03/2012 2033   LABOPIA NEGATIVE 11/28/2008 1212   COCAINSCRNUR NONE DETECTED 08/03/2012 2033   COCAINSCRNUR NEG 07/04/2010 2040   LABBENZ NONE DETECTED 08/03/2012 2033   LABBENZ NEG 07/04/2010 2040   AMPHETMU NONE DETECTED 08/03/2012 2033   AMPHETMU NEG 07/04/2010 2040   THCU NONE DETECTED 08/03/2012  2033   LABBARB NONE DETECTED 08/03/2012 2033    Alcohol Level:  Lab 08/03/12 2041  ETH <11   Urinalysis:  Lab 08/03/12 2033  COLORURINE YELLOW  LABSPEC 1.041*  PHURINE 5.5  GLUCOSEU >1000*  HGBUR NEGATIVE  BILIRUBINUR NEGATIVE  KETONESUR 40*  PROTEINUR NEGATIVE  UROBILINOGEN 0.2  NITRITE NEGATIVE  LEUKOCYTESUR NEGATIVE   Misc. Labs: Lipid Panel     Component Value Date/Time   CHOL 146 08/04/2012 0450   TRIG 99 08/04/2012 0450   HDL 36* 08/04/2012 0450   CHOLHDL 4.1 08/04/2012 0450   VLDL 20 08/04/2012 0450   LDLCALC 90 08/04/2012 0450     Studies/Results: Ct Head Wo Contrast  08/03/2012  *RADIOLOGY REPORT*  Clinical Data: Code stroke.  Syncopal episode.  Headache and left- sided weakness. Diabetes mellitus type 2.  CT HEAD WITHOUT CONTRAST  Technique:  Contiguous axial images were obtained from the base of the skull through the vertex without contrast.  Comparison: 05/18/2012.  Findings: There is no evidence for acute infarction,  intracranial hemorrhage, mass lesion, hydrocephalus, or extra-axial fluid.  Mild premature atrophy affects the cerebral cortex and cerebellum. No significant white matter disease.  No chronic lacunar infarction. The calvarium is intact.  Clear sinuses and mastoids.  Incidental dural calcification right greater than left middle cranial fossa. Similar appearance to priors.  IMPRESSION: Mild atrophy.  No visible acute intracranial abnormality.  Critical Value/emergent results were called by telephone at the time of interpretation on 08/03/2012 at 2:45 p.m. to Dr. Weldon Inches, who verbally acknowledged these results.   Original Report Authenticated By: Elsie Stain, M.D.    Mr Angiogram Head Wo Contrast  08/03/2012  *RADIOLOGY REPORT*  Clinical Data:  Left-sided numbness and weakness.  Diabetes mellitus.  Unspecified syncopal episodes.  Obesity. Suspect acute but ill-defined cerebrovascular disease.  MRI HEAD WITHOUT CONTRAST MRA HEAD WITHOUT CONTRAST  Technique:  Multiplanar, multiecho pulse sequences of the brain and surrounding structures were obtained without intravenous contrast. Angiographic images of the head were obtained using MRA technique without contrast.  Comparison:  CT head earlier in the day.  MRI 03/18/2009.  MRI HEAD  Findings:  There is no evidence for acute infarction, intracranial hemorrhage, mass lesion, hydrocephalus, or extra-axial fluid. Premature cerebral atrophy is present with prominence of the extracerebral CSF spaces, particularly over the convexity.  Mild cerebellar atrophy is also noted.  No significant white matter disease is seen.  No foci of chronic hemorrhage. Pituitary shows mild empty sella.  No cerebellar tonsillar herniation.  Upper cervical region unremarkable.  Negative orbits, sinuses, and mastoids.  IMPRESSION: Premature atrophy without acute intracranial findings. Similar appearance to MRI of 2010.  MRA HEAD  Findings: Widely patent carotid, vertebral, and basilar  arteries. Fetal origin left PCA.  No intracranial stenosis or aneurysm.  IMPRESSION: Negative exam.   Original Report Authenticated By: Elsie Stain, M.D.    Mr Brain Wo Contrast  08/03/2012  *RADIOLOGY REPORT*  Clinical Data:  Left-sided numbness and weakness.  Diabetes mellitus.  Unspecified syncopal episodes.  Obesity. Suspect acute but ill-defined cerebrovascular disease.  MRI HEAD WITHOUT CONTRAST MRA HEAD WITHOUT CONTRAST  Technique:  Multiplanar, multiecho pulse sequences of the brain and surrounding structures were obtained without intravenous contrast. Angiographic images of the head were obtained using MRA technique without contrast.  Comparison:  CT head earlier in the day.  MRI 03/18/2009.  MRI HEAD  Findings:  There is no evidence for acute infarction, intracranial hemorrhage, mass lesion, hydrocephalus, or extra-axial fluid.  Premature cerebral atrophy is present with prominence of the extracerebral CSF spaces, particularly over the convexity.  Mild cerebellar atrophy is also noted.  No significant white matter disease is seen.  No foci of chronic hemorrhage. Pituitary shows mild empty sella.  No cerebellar tonsillar herniation.  Upper cervical region unremarkable.  Negative orbits, sinuses, and mastoids.  IMPRESSION: Premature atrophy without acute intracranial findings. Similar appearance to MRI of 2010.  MRA HEAD  Findings: Widely patent carotid, vertebral, and basilar arteries. Fetal origin left PCA.  No intracranial stenosis or aneurysm.  IMPRESSION: Negative exam.   Original Report Authenticated By: Elsie Stain, M.D.    Medications:  Scheduled Meds:    . insulin aspart  6 Units Subcutaneous TID WC  . insulin glargine  46 Units Subcutaneous QHS  . potassium & sodium phosphates  1 packet Oral Once  . potassium chloride  40 mEq Oral Once  . potassium chloride  40 mEq Oral Once  . rivaroxaban  20 mg Oral Q supper  . DISCONTD: insulin glargine  40 Units Subcutaneous QHS  .  DISCONTD: potassium chloride  40 mEq Oral Once  . DISCONTD: rivaroxaban  20 mg Oral Daily   Continuous Infusions:    . sodium chloride 75 mL/hr at 08/03/12 2252   PRN Meds:.acetaminophen, albuterol, diphenhydrAMINE, ondansetron (ZOFRAN) IV, ondansetron, DISCONTD: albuterol  Assessment/Plan: 41 y.o woman significant PMH presented 8/24 after syncopal episode associated with left sided weakness and numbness and complaints of chest pain.   1. Syncope  -multiple unprovoked episodes of syncope in the past, last 05/18/12  -associated with left sided weakness (arm/leg), decreased sensation left side of body  -unclear etiology to date, associated with pt feeling hot could be situational  -will w/u for neuro (possible TIA) vs cardiac etiology  -CT and MRI/A negative; MRI does show mild cerebellar atrophy, pituitary with mild empty sella noted on similar MRI 2010 -orthostatic VS negative, NSR EKG  -pt has loop monitor intact since 2010 or 2011-called [place] [employer] rep to interrogate device she determined no events to correlate with event yesterday but mentioned the pt had AF x 2 min on 02/2012 with median ventricular rate of 128. No EKG was done at that time because the AF setting was turned off.  She reported the patient called in 07/21/12.   -Neuro (Dr. Roseanne Reno) consulted considered possible right cerebral TIA (transient ischemic attack) as etiology; per Neuro pt stable for discharge can get EEG inpt or outpatient - PT/OT (PT rec cane at discharge), pending echo/US dopplers results, EEG on 8/26 inpt or outpatient per Dr. Roseanne Reno  -neg UDS   2. Chest pain  -CE neg x 3; EKG with NSR   -pt has loop monitor intact [employer] rep will interrogate today    3. DM 2  -HA1C 10.9, elevated fsbs this admission recently ranging 190s-250s -restarted home Lantus 46 units and Novolog 6 units w/ meals  -cbg qid w/ meals   4. HTN  -104/70 -h/o HTN taking Lisinopril 20 mg  -will hold antiHTN for now in  setting of w/u for CC syncope -consider restarting Lisinopril outpatient    5. Asthma  -Albuterol inhaler prn   6. History of DVT/PE, multiple  -related to OCP use for dysfunctional uterine bleeding, pt still has IUD intact getting removed 08/2012 -last DVT 05/2012  -cont Xarelto  7. F/E/N  -NS 10-20 cc/hr  -will monitor electrolytes prn Phos 2.0-replaced today  -diet ordered    8. DVT Px  -Xarelto  9. Dispo  -home pending w/u possibly 08/05/12      LOS: 1 day   Makayla Frazier 08/04/2012, 1:52 PM

## 2012-08-04 NOTE — Evaluation (Signed)
Physical Therapy Evaluation Patient Details Name: Makayla Frazier MRN: [id number] DOB: 1971/08/30 Today's Date: 08/04/2012 Time: 7829-5621 PT Time Calculation (min): 29 min  PT Assessment / Plan / Recommendation Clinical Impression  Patient is a 41 yo female admitted following syncope.  Patient does have slight left weakness, with minimal impact to functional mobility.  Recommend cane for increased feeling of stability.  Patient without dizziness when testing for gaze stability and vestibular issues.  No acute PT needs identified - PT will sign off.  Recommend patient continue to ambulate with nursing in hallway.    PT Assessment  Patent does not need any further PT services    Follow Up Recommendations  No PT follow up;Supervision - Intermittent    Barriers to Discharge        Equipment Recommendations  Cane    Recommendations for Other Services     Frequency      Precautions / Restrictions Precautions Precautions: Fall (due to syncopal episodes) Restrictions Weight Bearing Restrictions: No         Mobility  Bed Mobility Bed Mobility: Supine to Sit Supine to Sit: 7: Independent;HOB flat Details for Bed Mobility Assistance: No cues or assist needed. Transfers Transfers: Sit to Stand;Stand to Sit Sit to Stand: 7: Independent;With upper extremity assist;From bed Stand to Sit: 7: Independent;With upper extremity assist;To bed Details for Transfer Assistance: No cues or assist needed. Ambulation/Gait Ambulation/Gait Assistance: 5: Supervision Ambulation Distance (Feet): 120 Feet Assistive device: None Ambulation/Gait Assistance Details: Supervision for safety, no physical assist needed. Noted decreased dorsiflexion LLE during gait.  Patient reports she feels "unsteady".  No loss of balance noted. Gait Pattern: Step-through pattern;Left foot flat;Decreased dorsiflexion - left Gait velocity: Somewhat decreased Modified Rankin (Stroke Patients Only) Pre-Morbid Rankin  Score: No symptoms Modified Rankin: No significant disability      PT Goals  N/a  Visit Information  Last PT Received On: 08/04/12 Assistance Needed: +1    Subjective Data  Subjective: I was going to get a cane anyway before I came here. Patient Stated Goal: To go home soon   Prior Functioning  Home Living Lives With: Significant other Available Help at Discharge: Available 24 hours/day;Friend(s) Type of Home: Apartment Home Access: Level entry Home Layout: One level Bathroom Shower/Tub: Tub/shower unit;[employer]: Standard Home Adaptive Equipment: None Prior Function Level of Independence: Independent Able to Take Stairs?: Yes Driving: No Vocation: On disability Communication Communication: No difficulties Dominant Hand: Right    Cognition  Overall Cognitive Status: Appears within functional limits for tasks assessed/performed Arousal/Alertness: Awake/alert Orientation Level: Appears intact for tasks assessed Behavior During Session: [facility] for tasks performed    Extremity/Trunk Assessment Right Upper Extremity Assessment RUE ROM/Strength/Tone: Within functional levels RUE Sensation: WFL - Light Touch Left Upper Extremity Assessment LUE ROM/Strength/Tone: Within functional levels LUE Sensation: WFL - Light Touch LUE Coordination: Deficits LUE Coordination Deficits: Decreased fine motor coordination in hand.  Patient reports her hand feels "tight", and that it feels different. Right Lower Extremity Assessment RLE ROM/Strength/Tone: Within functional levels RLE Sensation: WFL - Light Touch Left Lower Extremity Assessment LLE ROM/Strength/Tone: Deficits LLE ROM/Strength/Tone Deficits: Slight decrease in strength 4+/5 at hip/knee.  DF/PF 3/5. LLE Sensation: WFL - Light Touch Trunk Assessment Trunk Assessment: Normal   Balance Balance Balance Assessed: Yes High Level Balance High Level Balance Activites: Direction changes;Turns;Sudden stops;Head  turns High Level Balance Comments: No loss of balance during high level activities.  End of Session PT - End of Session Equipment Utilized During  Treatment: Gait belt Activity Tolerance: Patient tolerated treatment well Patient left: in bed;with call bell/phone within reach (sitting on EOB) Nurse Communication: Mobility status (Continue to ambulate in hallway with nursing)  GP     Vena Austria 08/04/2012, 12:52 PM Durenda Hurt. Renaldo Fiddler, [facility] Pager [phone]

## 2012-08-04 NOTE — Progress Notes (Signed)
N  HPI: Makayla Frazier is an 41 y.o. female history of hypertension hyperlipidemia diabetes mellitus obesity obstructive sleep apnea and deep vein thrombosis on Xarelto, presenting with history of episode of loss of consciousness at 1340 this afternoon followed by experiencing weakness and numbness involving left face arm and leg on waking up. No seizure activity was reported. There was no postictal confusion reported. Strength has improved since onset.   She had similar passing out episode in the past, this time proceed by feeling hot, hungry.  Marland KitchenMRI brain showed no acute stroke   Past Medical History   Diagnosis  Date   .  PE (pulmonary embolism)      2009 - on oral contraception   .  Syncopal episodes      unknown etiology   .  DM (diabetes mellitus)    .  Dysfunctional uterine bleeding      Seen by Dr. Pennie Rushing, GYN   .  Asthma    .  OSA (obstructive sleep apnea)    .  Major depression      Hx suicide attempt in 2009, [facility] admissions   .  Syncope and collapse      unknow etiology extensive w/u cardiology   .  Obesity    .  Mastodynia    .  Hypertension    .  Post - coital bleeding  2012   .  Labial lesion  2013   .  BV (bacterial vaginosis)  2012   .  Oligomenorrhea  2011   .  DVT (deep venous thrombosis)  2011    Family History   Problem  Relation  Age of Onset   .  Melanoma  Father  80   .  Ulcerative colitis  Mother  8   .  Breast cancer  Paternal Grandmother    .  Diabetes type II  Maternal Grandmother       deceased 79    .  Pulmonary embolism  Paternal Grandfather     Medications:  Prior to Admission:  Albuterol inhaler 2 puffs every 4-6 hours when necessary wheezing or shortness of breath  Vitamin D2 50,000 units twice a week  NovoLog 6 units 3 times per day when necessary blood sugar greater than 200  Lantus 40 units at bedtime  Lisinopril 20 mg per day  Xarelto 20 mg per day  Physical Examination:  Blood pressure 123/76, pulse 88, temperature 98.1 F  (36.7 C), temperature source Oral, resp. rate 18, height 5' (1.524 m), weight 77.111 kg (170 lb), last menstrual period 07/02/2012, SpO2 99.00%.  Neurologic Examination:  Mental Status:  Alert, oriented, thought content appropriate. Speech fluent without evidence of aphasia. Able to follow commands without difficulty.  Cranial Nerves:  II-Visual fields were normal.  III/IV/VI-Pupils were equal and reacted. Extraocular movements were full and conjugate.  V/VII-no facial numbness and no facial weakness.  VIII-normal.  X-normal speech and symmetrical palatal movement.  XII-midline tongue extension  Motor: 5/5 bilaterally with normal tone and bulk  Sensory: Reduced perception of tactile sensation to touch and pinprick over left face arm and leg compared to right side.  Deep Tendon Reflexes: 2+ and symmetric.  Plantars: Flexor bilaterally  Cerebellar: Normal finger-to-nose testing.  Carotid auscultation: Normal   .   Assessment: 41 y.o. female presenting with syncope, MRI showed no acute stroke.   Keep well hydration. Ok to discharge from neurology standpoint

## 2012-08-04 NOTE — Progress Notes (Signed)
VASCULAR LAB PRELIMINARY  PRELIMINARY  PRELIMINARY  PRELIMINARY  Carotid Dopplers completed.    Preliminary report:  There is no ICA stenosis.  Vertebral artery flow is antegrade.  Makayla Frazier, 08/04/2012, 9:56 AM

## 2012-08-04 NOTE — Progress Notes (Signed)
  Echocardiogram 2D Echocardiogram has been performed.  [name] FRANCES 08/04/2012, 12:21 PM

## 2012-08-04 NOTE — H&P (Signed)
IM Attending on-call  40 woman with recurrent syncope.  Has happened several times.  Witnesses did not report seizure.  Has implanted loop recorder.  Neuro advised EEG.  Cardiac echo in progress.  Has DM on insulin but A1c = 11 so hypoglycemia is less likely.  We do need to learn whether EMS obtained BG in field.  CT and MRI negative.  VS and O2 normal.  She reports left-sided weakness this time; this is new.  Neurologist found her strength to be symmetric. TIA and seizure are possible.  Pending studies and interrogation of loop recorder are next.

## 2012-08-05 ENCOUNTER — Inpatient Hospital Stay (HOSPITAL_COMMUNITY): Payer: Medicare Other

## 2012-08-05 DIAGNOSIS — R55 Syncope and collapse: Principal | ICD-10-CM

## 2012-08-05 LAB — GLUCOSE, CAPILLARY: Glucose-Capillary: 242 mg/dL — ABNORMAL HIGH (ref 70–99)

## 2012-08-05 LAB — BASIC METABOLIC PANEL
CO2: 23 mEq/L (ref 19–32)
Chloride: 104 mEq/L (ref 96–112)
Creatinine, Ser: 0.59 mg/dL (ref 0.50–1.10)
GFR calc Af Amer: >90 mL/min (ref >90)
Potassium: 3.6 mEq/L (ref 3.5–5.1)

## 2012-08-05 LAB — FERRITIN: Ferritin: 136 ng/mL (ref 10–291)

## 2012-08-05 MED ORDER — INSULIN GLARGINE 100 UNIT/ML ~~LOC~~ SOLN: 46.0000 [IU] | Freq: Every day | SUBCUTANEOUS | Status: DC

## 2012-08-05 MED ORDER — ASPIRIN 81 MG PO TABS: 81.0000 mg | ORAL_TABLET | Freq: Every day | ORAL | Status: DC

## 2012-08-05 NOTE — Progress Notes (Signed)
Pt was given discharge information on when to followup or come in. Pt's VS were checked and charted. Pt's IV and telemetry were removed. Tolerated the removal of the IV well. Pt is stable and eager to go home. Sanda Linger

## 2012-08-05 NOTE — Progress Notes (Signed)
Internal Medicine Attending  Date: 08/05/2012  Patient name: Makayla Frazier Medical record number: [id number] Date of birth: October 11, 1971 Age: 41 y.o. Gender: female  I saw and evaluated the patient on a.m. rounds with house staff. I reviewed the resident's note by Dr. Shirlee Latch and I agree with the resident's findings and plans as documented in her note, with the following additional comments.  The etiology of patient's recurrent syncope is not clear.  Some of the features such as premonitory symptoms of diaphoresis and nausea suggest vasovagal syncope; other aspects raise concern for seizures.  EEG is pending.  Agree with plans for EP consult, since patient is followed by Dr. Johney Frame and has an implantable loop recorder in place.

## 2012-08-05 NOTE — Care Management Note (Signed)
    Page 1 of 1   08/05/2012     12:05:42 PM   CARE MANAGEMENT NOTE 08/05/2012  Patient:  Makayla Frazier, Makayla Frazier   Account Number:  [id number]  Date Initiated:  08/05/2012  Documentation initiated by:  GRAVES-BIGELOW,Elester Apodaca  Subjective/Objective Assessment:   Pt admitted with syncope. P has family support and will plan for d/c today.     Action/Plan:   Pt needs a cane for home. CM did call [facility] for dme to be delivered to room. No further services for CM at this time.   Anticipated DC Date:  08/05/2012   Anticipated DC Plan:  HOME/SELF CARE      DC Planning Services  CM consult      PAC Choice  DURABLE MEDICAL EQUIPMENT   Choice offered to / List presented to:  C-1 Patient   DME arranged  CANE      DME agency  [facility]        Status of service:  Completed, signed off Medicare Important Message given?   (If response is "NO", the following Medicare IM given date fields will be blank) Date Medicare IM given:   Date Additional Medicare IM given:    Discharge Disposition:  HOME/SELF CARE  Per UR Regulation:  Reviewed for med. necessity/level of care/duration of stay  If discussed at Long Length of Stay Meetings, dates discussed:    Comments:

## 2012-08-05 NOTE — Plan of Care (Signed)
Problem: Phase II Progression Outcomes Goal: Tolerates increased mobility Outcome: Completed/Met Date Met:  08/05/12 Pt is tolerating mobility well with her cane. Pt is stable and able to walk to restroom on her own with a steady gait.  Goal: Monitor D/C'd if no arrhythmias x 24 hrs Outcome: Completed/Met Date Met:  08/05/12 Monitor d/c'd upon discharge Goal: Nutritional status adequate Outcome: Completed/Met Date Met:  08/05/12 Pt is tolerating her diet well Goal: Discharge plan established Outcome: Completed/Met Date Met:  08/05/12 Discharge plan has been establilshed Goal: Tolerating diet Outcome: Completed/Met Date Met:  08/05/12 Tolerating diet well  Problem: Phase III Progression Outcomes Goal: Neuro status stablized/improved Outcome: Completed/Met Date Met:  08/05/12 Neuro checks within baseline for pt Goal: Anticoagulation Therapy per MD order Outcome: Completed/Met Date Met:  08/05/12 Pt will continue xarleto and add 81 mg of ASA daily  Problem: Discharge Progression Outcomes Goal: NIHSS documented on discharge Outcome: Completed/Met Date Met:  08/05/12 N

## 2012-08-05 NOTE — Evaluation (Signed)
Occupational Therapy Evaluation Patient Details Name: Makayla Frazier MRN: [id number] DOB: 05/17/1971 Today's Date: 08/05/2012 Time: 1255-1310 OT Time Calculation (min): 15 min  OT Assessment / Plan / Recommendation Clinical Impression  41 yo admitted due to syncope with LT side weakness. Ot to follow acutely for x1 more visit due to LT side weakness.    OT Assessment  Patient needs continued OT Services    Follow Up Recommendations  Outpatient OT    Barriers to Discharge      Equipment Recommendations  Cane    Recommendations for Other Services    Frequency  Other (comment) (x1 more visit)    Precautions / Restrictions Precautions Precautions: Fall Restrictions Weight Bearing Restrictions: No   Pertinent Vitals/Pain None Lt side weakness noted    ADL  Eating/Feeding: Simulated;Modified independent Where Assessed - Eating/Feeding: Edge of bed Grooming: Simulated;Wash/dry hands;Wash/dry face;Modified independent Where Assessed - Grooming: Unsupported sitting Toilet Transfer: Simulated;Modified independent Toilet Transfer Method: Sit to Barista: Raised toilet seat with arms (or 3-in-1 over toilet) Equipment Used: Cane Transfers/Ambulation Related to ADLs: Pt performed 61ft in 19 seconds and completed turn in 4 steps / less than 4 seconds. Pt with increased stability with straight cane.  ADL Comments: pt verbalizes no deficits at this time. However with testing pt demonstrates Lt side weakness and decreased grasp.    OT Diagnosis: Generalized weakness  OT Problem List: Decreased strength;Impaired balance (sitting and/or standing);Impaired UE functional use OT Treatment Interventions: Therapeutic activities;Balance training;Patient/family education   OT Goals Acute Rehab OT Goals OT Goal Formulation: With patient Time For Goal Achievement: 08/12/12 Potential to Achieve Goals: Good Arm Goals Additional Arm Goal #1: Pt will demonstrate 2  exercises for fine motor LT ue to increase ability to open containers Arm Goal: Additional Goal #1 - Progress: Goal set today Additional Arm Goal #2: Pt will demonstrate/ verbalize 2 tasks for strengthening of LT UE ( playdoe, making cookies, BIL UE task etc)  Arm Goal: Additional Goal #2 - Progress: Goal set today  Visit Information  Last OT Received On: 08/05/12 Assistance Needed: +1    Subjective Data  Subjective: "Do I have to use this cane?" - pt inquiring about cane in room. question cue - "why is the cane in your room?" pt states "this guy brought it" question cue "why did the guy give it to you?" Pt states "that other lady said i needed one" question cue "do you think you should use the cane?" Pt states "ill try it" Pt educated on the use of the cane to increase independence decrease fall risk. at the end of the session pt said "this cane really helps" Patient Stated Goal: to go home today- have surg in two weeks   Prior Functioning  Vision/Perception  Home Living Lives With: Significant other Available Help at Discharge: Available 24 hours/day;Friend(s) Type of Home: Apartment Home Access: Level entry Home Layout: One level Bathroom Shower/Tub: Tub/shower unit;Curtain Firefighter: Standard Home Adaptive Equipment: None Prior Function Level of Independence: Independent Able to Take Stairs?: Yes Driving: No Vocation: On disability Communication Communication: No difficulties Dominant Hand: Right      Cognition  Overall Cognitive Status: Appears within functional limits for tasks assessed/performed Arousal/Alertness: Awake/alert Orientation Level: Appears intact for tasks assessed Behavior During Session: [facility] for tasks performed Cognition - Other Comments: decreased awareness of Lt side deficits    Extremity/Trunk Assessment Right Upper Extremity Assessment RUE ROM/Strength/Tone: Within functional levels RUE Sensation: WFL - Light Touch RUE  Coordination: WFL -  gross motor Left Upper Extremity Assessment LUE ROM/Strength/Tone: Deficits LUE ROM/Strength/Tone Deficits: 4 + out 5 decreased proprioception and grasp strength 3- out 5.  LUE Sensation: WFL - Light Touch LUE Coordination: Deficits LUE Coordination Deficits: pt educated to complete bil Ue tasks, play with play doe, make cookies, pick up objects like coins to increase LT hand stength/ fine motor skills Trunk Assessment Trunk Assessment: Normal   Mobility Bed Mobility Supine to Sit: 7: Independent;HOB flat Details for Bed Mobility Assistance: No cues or assist needed. Transfers Sit to Stand: 7: Independent;With upper extremity assist;From bed Stand to Sit: 7: Independent;With upper extremity assist;To bed Details for Transfer Assistance: No cues or assist needed.   Exercise    Balance    End of Session OT - End of Session Equipment Utilized During Treatment: Gait belt Activity Tolerance: Patient tolerated treatment well Patient left: in chair;with call bell/phone within reach Nurse Communication: Mobility status  GO     Lucile Shutters 08/05/2012, 2:05 PM Pager: [phone]

## 2012-08-05 NOTE — Consult Note (Signed)
ELECTROPHYSIOLOGY CONSULT NOTE  Patient ID: Makayla Frazier MRN: [id number], DOB/AGE: 1971/03/23   Admit date: 08/03/2012 Date of Consult: 08/05/2012  Primary Physician: Elpidio Anis, PA-C Primary Cardiologist: Hillis Range, MD Reason for Consultation: Syncope  History of Present Illness [name] is a 41 year old woman with DM, asthma, OSA, recurrent PE/DVT and recurrent syncope s/p loop recorder implantation who was admitted over the weekend after a syncopal episode while out at a family gathering on Saturday. [name] reports LOC, lasting 4-5 minutes in duration, was witnessed by several family members. She describes feeling "hot" just prior to LOC. Upon regaining consciousness, she felt weak and numb on the left side which prompted her to seek emergent medical attention. Stroke has been ruled out with negative CT and MRI/MRA. Her loop recorder interrogation did not show any arrhythmias correlating to her syncopal event. Of note, Dr. Jenel Lucks most recent note outlines his thoughts regarding her syncope and his recommendation was for her to follow-up with neurology as he was concerned regarding possible seizures. He did not feel her syncopal events were of cardiac etiology. She had an EEG performed December 2009 which did not show epileptiform activity. She underwent an EEG this morning and the report is pending. She reports this episode was exactly like all the previous events, except this was followed by weakness and numbness. She states her "spells" tend to occur when she is tired and are always accompanied by chest pain and SOB.   Further history-Klein: These spells have a variable prodrome and recovery phase. Prodrome to be quite short. It is occasionally associated with a sense of warmth. She sometimes has sufficient duration to sit down; other times she does not. They are not necessarily associated with being upright; gentleman who is with her says that sometimes they occur at night while  she is sleeping and he notes that because of the change in her breathing pattern. This could also potentially, I might guess, obstructive sleep apnea.  These spells are associated with recovery fatigue. She is also noted to occasionally pass out when she goes from being in a seated position to a lying down position following one of these episodes. On arrival of EMS, she has been identified as having hypertension. The symptoms in recovery phase are also quite variable.  Her testing has not included tilt table test.  Past Medical History  Diagnosis Date  . PE (pulmonary embolism)     2009 - on oral contraception; multiple  . Syncopal episodes     unknown etiology  . DM (diabetes mellitus)   . Dysfunctional uterine bleeding     Seen by Dr. Pennie Rushing, GYN; pending hysterectomy as of 07/2012  . Asthma   . OSA (obstructive sleep apnea)   . Major depression     Hx suicide attempt in 2009, [facility] admissions  . Syncope and collapse     unknow etiology extensive w/u cardiology  . Obesity   . Mastodynia   . Hypertension   . Post - coital bleeding 2012  . Labial lesion 2013  . BV (bacterial vaginosis) 2012  . Oligomenorrhea 2011  . DVT (deep venous thrombosis) 2011    pt had IUD; also 05/2012  . H/O hypercoagulable state     2/2 contraception  . HLD (hyperlipidemia)     Past Surgical History  Procedure Date  . Breast reduction surgery     Dr. Kathie Dike [facility] 2011  . Cholecystectomy   . Cardiac electrophysiology study & dft  Implantable Loop recorder  . Endometrial biopsy 2011     Allergies/Intolerances Allergies  Allergen Reactions  . Codeine   . Darvocet (Propoxyphene-Acetaminophen)   . Hydrocodone-Acetaminophen   . Latex   . Oxycodone-Acetaminophen   . Percocet (Oxycodone-Acetaminophen)    Inpatient Medications . insulin aspart  6 Units Subcutaneous TID WC  . insulin glargine  46 Units Subcutaneous QHS  . potassium & sodium phosphates  1 packet Oral Once  . rivaroxaban  20  mg Oral Q supper  . DISCONTD: insulin glargine  40 Units Subcutaneous QHS  . DISCONTD: rivaroxaban  20 mg Oral Daily   . sodium chloride 10 mL/hr (08/04/12 1443)   Family History Problem Relation Age of Onset  . Melanoma Father 72  . Ulcerative colitis Mother 42  . Breast cancer Paternal Grandmother   . Diabetes type II Maternal Grandmother     deceased 37  . Pulmonary embolism Paternal Grandfather     Social History Social History  . Marital Status: Single   Occupational History  . 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 Smoking status: Never Smoker   . Smokeless tobacco: Never Used  . Alcohol Use: No  . Drug Use: No  . Sexually Active: Yes    Birth Control/ Protection: IUD   Social History Narrative   Lives in [place] with significant other, WilliamCompleted 10th grade.Worker Compensation Case 2009-2012 related to PE    Review of Systems General: No chills, fever, night sweats or weight changes  Cardiovascular: No chest pain, dyspnea on exertion, edema, orthopnea, palpitations, paroxysmal nocturnal dyspnea Dermatological: No rash, lesions or masses Respiratory: No cough, dyspnea Urologic: No hematuria, dysuria Abdominal:   No nausea, vomiting, diarrhea, bright red blood per rectum, melena, or hematemesis Neurologic:  No visual changes All other systems reviewed and are otherwise negative except as noted above.  Physical Exam Blood pressure 136/82, pulse 69, temperature 98.1 F (36.7 C), temperature source Oral, resp. rate 18, height 5' (1.524 m), weight 169 lb 1.5 oz (76.7 kg), last menstrual period 07/02/2012, SpO2 97.00%.  General: Well developed, well appearing morbidly obese African American  41 year old female in no acute distress. HEENT: Normocephalic, atraumatic. EOMs intact. Sclera nonicteric. Oropharynx clear.  Neck: Supple without bruits. No JVD. Lungs: Respirations regular and unlabored, CTA bilaterally. No  wheezes, rales or rhonchi. Heart: RRR. S1, S2 present. No murmurs, rub, S3 or S4. Abdomen: Soft, non-tender, non-distended. BS present x 4 quadrants. No hepatosplenomegaly.  Extremities: No clubbing, cyanosis or edema. DP/PT/Radials 2+ and equal bilaterally. Psych: Normal affect. Neuro: Alert and oriented X 3. Moves all extremities spontaneously. Musculoskeletal: No kyphosis. Skin: Intact. Warm and dry. No rashes or petechiae in exposed areas.   Labs  Basename 08/04/12 1023 08/04/12 0421 08/03/12 2040 08/03/12 1431  CKTOTAL 124 115 144 171  CKMB 1.5 1.5 1.7 1.9  TROPONINI <0.30 <0.30 <0.30 <0.30   Lab Results  Component Value Date   WBC 9.4 08/04/2012   HGB 11.1* 08/04/2012   HCT 34.0* 08/04/2012   MCV 78.5 08/04/2012   PLT 283 08/04/2012    Lab 08/05/12 0455 08/03/12 1428  NA 136 --  K 3.6 --  CL 104 --  CO2 23 --  BUN 7 --  CREATININE 0.59 --  CALCIUM 8.9 --  PROT -- 7.6  BILITOT -- 0.3  ALKPHOS -- 54  ALT -- 19  AST -- 15  GLUCOSE 123* --  No components found with this basename: MAGNESIUM No components found with this basename: POCBNP:3   Basename 08/03/12 2041  TSH 1.056  T4TOTAL --  T3FREE --  THYROIDAB --    Basename 08/04/12 1023  VITAMINB12 498  FOLATE >20.0  FERRITIN 136  TIBC 267  IRON 85  RETICCTPCT 1.2    Basename 08/03/12 1428  INR 1.16    Radiology/Studies Ct Head Wo Contrast  08/03/2012  *RADIOLOGY REPORT*  Clinical Data: Code stroke.  Syncopal episode.  Headache and left- sided weakness. Diabetes mellitus type 2.  CT HEAD WITHOUT CONTRAST  Technique:  Contiguous axial images were obtained from the base of the skull through the vertex without contrast.  Comparison: 05/18/2012.  Findings: There is no evidence for acute infarction, intracranial hemorrhage, mass lesion, hydrocephalus, or extra-axial fluid.  Mild premature atrophy affects the cerebral cortex and cerebellum. No significant white matter disease.  No chronic lacunar infarction. The  calvarium is intact.  Clear sinuses and mastoids.  Incidental dural calcification right greater than left middle cranial fossa. Similar appearance to priors.  IMPRESSION: Mild atrophy.  No visible acute intracranial abnormality.  Critical Value/emergent results were called by telephone at the time of interpretation on 08/03/2012 at 2:45 p.m. to Dr. Weldon Inches, who verbally acknowledged these results.   Original Report Authenticated By: Elsie Stain, M.D.    Mr Angiogram Head Wo Contrast  08/03/2012  *RADIOLOGY REPORT*  Clinical Data:  Left-sided numbness and weakness.  Diabetes mellitus.  Unspecified syncopal episodes.  Obesity. Suspect acute but ill-defined cerebrovascular disease.  MRI HEAD WITHOUT CONTRAST MRA HEAD WITHOUT CONTRAST  Technique:  Multiplanar, multiecho pulse sequences of the brain and surrounding structures were obtained without intravenous contrast. Angiographic images of the head were obtained using MRA technique without contrast.  Comparison:  CT head earlier in the day.  MRI 03/18/2009.  MRI HEAD  Findings:  There is no evidence for acute infarction, intracranial hemorrhage, mass lesion, hydrocephalus, or extra-axial fluid. Premature cerebral atrophy is present with prominence of the extracerebral CSF spaces, particularly over the convexity.  Mild cerebellar atrophy is also noted.  No significant white matter disease is seen.  No foci of chronic hemorrhage. Pituitary shows mild empty sella.  No cerebellar tonsillar herniation.  Upper cervical region unremarkable.  Negative orbits, sinuses, and mastoids.  IMPRESSION: Premature atrophy without acute intracranial findings. Similar appearance to MRI of 2010.  MRA HEAD  Findings: Widely patent carotid, vertebral, and basilar arteries. Fetal origin left PCA.  No intracranial stenosis or aneurysm.  IMPRESSION: Negative exam.   Original Report Authenticated By: Elsie Stain, M.D.    Mr Brain Wo Contrast  08/03/2012  *RADIOLOGY REPORT*   Clinical Data:  Left-sided numbness and weakness.  Diabetes mellitus.  Unspecified syncopal episodes.  Obesity. Suspect acute but ill-defined cerebrovascular disease.  MRI HEAD WITHOUT CONTRAST MRA HEAD WITHOUT CONTRAST  Technique:  Multiplanar, multiecho pulse sequences of the brain and surrounding structures were obtained without intravenous contrast. Angiographic images of the head were obtained using MRA technique without contrast.  Comparison:  CT head earlier in the day.  MRI 03/18/2009.  MRI HEAD  Findings:  There is no evidence for acute infarction, intracranial hemorrhage, mass lesion, hydrocephalus, or extra-axial fluid. Premature cerebral atrophy is present with prominence of the extracerebral CSF spaces, particularly over the convexity.  Mild cerebellar atrophy is also noted.  No significant white matter disease is seen.  No foci of chronic hemorrhage. Pituitary shows mild empty sella.  No cerebellar tonsillar  herniation.  Upper cervical region unremarkable.  Negative orbits, sinuses, and mastoids.  IMPRESSION: Premature atrophy without acute intracranial findings. Similar appearance to MRI of 2010.  MRA HEAD  Findings: Widely patent carotid, vertebral, and basilar arteries. Fetal origin left PCA.  No intracranial stenosis or aneurysm.  IMPRESSION: Negative exam.   Original Report Authenticated By: Elsie Stain, M.D.    Echocardiogram  - Left ventricle: The cavity size was normal. Wall thickness was normal. Systolic function was normal. The estimated ejection fraction was in the range of 55% to 60%. Wall motion was normal; there were no regional wall motion abnormalities. The transmitral flow pattern was normal. The deceleration time of the early transmitral flow velocity was normal. The pulmonary vein flow pattern was normal. The tissue Doppler parameters were normal. Left ventricular diastolic function parameters were normal. - Aortic valve: Poorly visualized. Mildly thickened leaflets. Doppler:  There was no stenosis. No regurgitation. - Aorta: Aortic root: The aortic root was normal in size. Ascending aorta: The ascending aorta was normal in size. - Mitral valve: Structurally normal valve. Leaflet separation was normal. Doppler: Transvalvular velocity was within the normal range. There was no evidence for stenosis. No significant regurgitation. Peak gradient: 2mm Hg (D). - Left atrium: The atrium was normal in size. - Right ventricle: The cavity size was normal. Wall thickness was normal. Systolic function was normal. - Pulmonic valve: Structurally normal valve. Cusp separation was normal. Doppler: Transvalvular velocity was within the normal range. No significant regurgitation. - Tricuspid valve: Structurally normal valve. Leaflet separation was normal. Doppler: Transvalvular velocity was within the normal range. No significant regurgitation. - Pulmonary artery: Systolic pressure could not be accurately estimated. - Right atrium: The atrium was normal in size. - Pericardium: There was no pericardial effusion. - Systemic veins: Inferior vena cava: The vessel was normal in size; the respirophasic diameter changes were in the normal range (= 50%); findings are consistent with normal central venous pressure.  12-lead ECG on admission shows normal sinus rhythm with normal intervals at 71 bpm Telemetry normal sinus rhythm; no arrhythmias Loop recorder interrogation shows no episodes, no arrhythmias correlating to her syncopal episode on 08/03/2012  Assessment and Plan 1. Syncope   Signed, Rick Duff, PA-C 08/05/2012, 1:14 PM   Impression: Given the interrogation of her loop recorder, it is fair to say that these are not arrhythmic episodes. The possibility persists that they could be vasomotor. In favor of this is a sensation of warmth, the prolonged duration, and the recovery fatigue. Against this diagnosis is that she is noted as being hypertensive afterwards, the variability in  the recovery phase symptomology, the aggravation of symptoms when she goes from sitting to lying flat and the occurrence of these episodes apparently while she is asleep but certainly lying flat.  Tilt table testing, in the event that were able to reproduce her symptoms, would be helpful in understanding the potential contribution of vasomotor instability. In the event that her blood pressure were stable, neuropsychiatric explanations would have to be pursued. In the event that her blood pressure was significantly low, appropriate therapies for that could be undertaken.  It is also appropriate to consider removal of her loop recorder that is diagnostic role has been completed.  From my perspective this patient can be discharged with followup with Dr. Johney Frame; we can defer to him the role of tilt table testing. The timing of neurological input in this case so may prompt the patient did stay in hospital a little bit  longer. I will defer this to the primary team

## 2012-08-05 NOTE — Plan of Care (Signed)
Problem: Consults Goal: Skin Care Protocol Initiated - if indicated If consults are not indicated, leave blank or document N/A  Outcome: Completed/Met Date Met:  08/05/12 Skin WNL; pt able to turn self Goal: Nutrition Consult-if indicated Outcome: Not Applicable Date Met:  08/05/12 Not indicated Goal: Diabetes Guidelines if Diabetic/Glucose > 140 If diabetic or lab glucose is > 140 mg/dl - Initiate Diabetes/Hyperglycemia Guidelines & Document Interventions  Outcome: Not Applicable Date Met:  08/05/12 Not indicated  Problem: Phase III Progression Outcomes Goal: Rehab Team goals identified Outcome: Completed/Met Date Met:  08/05/12 PT saw patient today Goal: Tolerates activity with minimal fatigue Outcome: Adequate for Discharge Able to ambulate with cane

## 2012-08-05 NOTE — Progress Notes (Signed)
Subjective: Pt noted previous episodes of passing out associated with lightheadedness, prodrome of feeling hot, dizzy, associated with nausea possible sweating.  She reports family reports LOC for 3 minutes with confusion after passing out.  She reports when she passes out it is associated with shaking a little a history of biting her tongue, bowel/bladder incontinence but not this occasion.  She reports a history of palpitations.  She reports last week her throat was hurting 2/2 allergies but she took an allergy med that helped .    Objective: Vital signs in last 24 hours: Filed Vitals:   08/04/12 2100 08/05/12 0000 08/05/12 0500 08/05/12 1103  BP: 118/73 128/81 133/87 136/82  Pulse: 74 68 73 69  Temp: 98.9 F (37.2 C) 98.6 F (37 C) 98 F (36.7 C) 98.1 F (36.7 C)  TempSrc:      Resp: 18 18 18 18   Height:      Weight:      SpO2: 99% 99% 97% 97%   Weight change:   Intake/Output Summary (Last 24 hours) at 08/05/12 1441 Last data filed at 08/05/12 1100  Gross per 24 hour  Intake    240 ml  Output      0 ml  Net    240 ml   Vitals reviewed. General: resting in bed, NAD HEENT: no scleral icterus Cardiac: RRR, no rubs, murmurs or gallops, Loop recorder intact left chest with keloidal scar Pulm: clear to auscultation bilaterally, no wheezes, rales, or rhonchi Abd: soft, nontender, nondistended, BS present (normal) Ext: warm and well perfused, no pedal edema Neuro: alert and oriented X3, strength or sensation not evaluated today   Lab Results: Basic Metabolic Panel:  Lab 08/05/12 0454 08/04/12 1023 08/04/12 0450  NA 136 -- 140  K 3.6 -- 3.9  CL 104 -- 107  CO2 23 -- 25  GLUCOSE 123* -- 165*  BUN 7 -- 7  CREATININE 0.59 -- 0.65  CALCIUM 8.9 -- 8.8  MG -- 2.1 --  PHOS -- 2.0* --   Liver Function Tests:  Lab 08/03/12 1428  AST 15  ALT 19  ALKPHOS 54  BILITOT 0.3  PROT 7.6  ALBUMIN 3.6   CBC:  Lab 08/04/12 1809 08/03/12 1445 08/03/12 1428  WBC 9.4 -- 9.9    NEUTROABS 4.6 -- 5.3  HGB 11.1* 13.6 --  HCT 34.0* 40.0 --  MCV 78.5 -- 77.5*  PLT 283 -- 304   Cardiac Enzymes:  Lab 08/04/12 1023 08/04/12 0421 08/03/12 2040  CKTOTAL 124 115 144  CKMB 1.5 1.5 1.7  CKMBINDEX -- -- --  TROPONINI <0.30 <0.30 <0.30   CBG:  Lab 08/05/12 1107 08/05/12 0747 08/04/12 1952 08/04/12 1640 08/04/12 1153 08/04/12 0729  GLUCAP 229* 118* 125* 221* 225* 127*   Hemoglobin A1C:  Lab 08/03/12 2041  HGBA1C 10.9*   Fasting Lipid Panel:  Lab 08/04/12 0450  CHOL 146  HDL 36*  LDLCALC 90  TRIG 99  CHOLHDL 4.1  LDLDIRECT --   Thyroid Function Tests:  Lab 08/03/12 2041  TSH 1.056  T4TOTAL --  FREET4 --  T3FREE --  THYROIDAB --   Coagulation:  Lab 08/03/12 1428  LABPROT 15.0  INR 1.16   Anemia Panel:  Lab 08/04/12 1023  VITAMINB12 498  FOLATE >20.0  FERRITIN 136  TIBC 267  IRON 85  RETICCTPCT 1.2   Urine Drug Screen: Drugs of Abuse     Component Value Date/Time   LABOPIA NONE DETECTED 08/03/2012 2033   LABOPIA  NEGATIVE 11/28/2008 1212   COCAINSCRNUR NONE DETECTED 08/03/2012 2033   COCAINSCRNUR NEG 07/04/2010 2040   LABBENZ NONE DETECTED 08/03/2012 2033   LABBENZ NEG 07/04/2010 2040   AMPHETMU NONE DETECTED 08/03/2012 2033   AMPHETMU NEG 07/04/2010 2040   THCU NONE DETECTED 08/03/2012 2033   LABBARB NONE DETECTED 08/03/2012 2033    Alcohol Level:  Lab 08/03/12 2041  ETH <11   Urinalysis:  Lab 08/03/12 2033  COLORURINE YELLOW  LABSPEC 1.041*  PHURINE 5.5  GLUCOSEU >1000*  HGBUR NEGATIVE  BILIRUBINUR NEGATIVE  KETONESUR 40*  PROTEINUR NEGATIVE  UROBILINOGEN 0.2  NITRITE NEGATIVE  LEUKOCYTESUR NEGATIVE   Misc. Labs: Lipid Panel     Component Value Date/Time   CHOL 146 08/04/2012 0450   TRIG 99 08/04/2012 0450   HDL 36* 08/04/2012 0450   CHOLHDL 4.1 08/04/2012 0450   VLDL 20 08/04/2012 0450   LDLCALC 90 08/04/2012 0450     Studies/Results: Mr Angiogram Head Wo Contrast  08/03/2012  *RADIOLOGY REPORT*  Clinical Data:   Left-sided numbness and weakness.  Diabetes mellitus.  Unspecified syncopal episodes.  Obesity. Suspect acute but ill-defined cerebrovascular disease.  MRI HEAD WITHOUT CONTRAST MRA HEAD WITHOUT CONTRAST  Technique:  Multiplanar, multiecho pulse sequences of the brain and surrounding structures were obtained without intravenous contrast. Angiographic images of the head were obtained using MRA technique without contrast.  Comparison:  CT head earlier in the day.  MRI 03/18/2009.  MRI HEAD  Findings:  There is no evidence for acute infarction, intracranial hemorrhage, mass lesion, hydrocephalus, or extra-axial fluid. Premature cerebral atrophy is present with prominence of the extracerebral CSF spaces, particularly over the convexity.  Mild cerebellar atrophy is also noted.  No significant white matter disease is seen.  No foci of chronic hemorrhage. Pituitary shows mild empty sella.  No cerebellar tonsillar herniation.  Upper cervical region unremarkable.  Negative orbits, sinuses, and mastoids.  IMPRESSION: Premature atrophy without acute intracranial findings. Similar appearance to MRI of 2010.  MRA HEAD  Findings: Widely patent carotid, vertebral, and basilar arteries. Fetal origin left PCA.  No intracranial stenosis or aneurysm.  IMPRESSION: Negative exam.   Original Report Authenticated By: Elsie Stain, M.D.    Mr Brain Wo Contrast  08/03/2012  *RADIOLOGY REPORT*  Clinical Data:  Left-sided numbness and weakness.  Diabetes mellitus.  Unspecified syncopal episodes.  Obesity. Suspect acute but ill-defined cerebrovascular disease.  MRI HEAD WITHOUT CONTRAST MRA HEAD WITHOUT CONTRAST  Technique:  Multiplanar, multiecho pulse sequences of the brain and surrounding structures were obtained without intravenous contrast. Angiographic images of the head were obtained using MRA technique without contrast.  Comparison:  CT head earlier in the day.  MRI 03/18/2009.  MRI HEAD  Findings:  There is no evidence for acute  infarction, intracranial hemorrhage, mass lesion, hydrocephalus, or extra-axial fluid. Premature cerebral atrophy is present with prominence of the extracerebral CSF spaces, particularly over the convexity.  Mild cerebellar atrophy is also noted.  No significant white matter disease is seen.  No foci of chronic hemorrhage. Pituitary shows mild empty sella.  No cerebellar tonsillar herniation.  Upper cervical region unremarkable.  Negative orbits, sinuses, and mastoids.  IMPRESSION: Premature atrophy without acute intracranial findings. Similar appearance to MRI of 2010.  MRA HEAD  Findings: Widely patent carotid, vertebral, and basilar arteries. Fetal origin left PCA.  No intracranial stenosis or aneurysm.  IMPRESSION: Negative exam.   Original Report Authenticated By: Elsie Stain, M.D.    Medications:  Scheduled Meds:    .  insulin aspart  6 Units Subcutaneous TID WC  . insulin glargine  46 Units Subcutaneous QHS  . potassium & sodium phosphates  1 packet Oral Once  . rivaroxaban  20 mg Oral Q supper   Continuous Infusions:    . sodium chloride 10 mL/hr (08/04/12 1443)   PRN Meds:.acetaminophen, albuterol, diphenhydrAMINE, ondansetron  Assessment/Plan: 41 y.o woman significant PMH presented 8/24 after syncopal episode associated with left sided weakness and numbness and complaints of chest pain.   1. Syncope  -multiple unprovoked episodes of syncope in the past -associated with left sided weakness (arm/leg), decreased sensation left side of body  -unclear etiology to date, associated with pt feeling hot could be situational, vasovagal vls other etiology -CT and MRI/A negative; MRI does show mild cerebellar atrophy, pituitary with mild empty sella noted on similar MRI 2010 - NSR EKG, 2D echo wnl EF 55-60%, US carotids neg stenosis -loop monitor interrogated with no events to correlate with syncopal episode but mentioned the pt had AF x 2 min on 02/2012 with median ventricular rate of 128.  No EKG was done at that time because the AF setting was turned off.   [employer] rep reported that patient called in 07/21/12.  Rep will inquire about AF recording with EP  Plan -Neuro consulted said ok to d/c but will reconsult Neurology -EEG performed today pending results-EEG normal spoke with Dr. Roseanne Reno 08/05/12 at 2:32 PM no further neuro recs -Consulted EP to comment on if they think this is vasovagal or would they consider tilt table test  2. Chest pain  -resolved  -CE neg x 3; EKG with NSR   -pt has loop monitor intact [employer] rep interrogated w/o events    3. DM 2  -HA1C 10.9, fsbs 120s-220 after 58 units (lantus +SSI) -Lantus 46 units and Novolog 6 units w/ meals  -cbg qid w/ meals   4. HTN  -133/87 -h/o HTN taking Lisinopril 20 mg  -holding antiHTN for now in setting of w/u for CC syncope -consider restarting Lisinopril outpatient    5. Asthma  -Albuterol inhaler prn   6. History of DVT/PE, multiple  -related to OCP use for dysfunctional uterine bleeding, pt still has IUD intact getting removed 08/2012 -last DVT 05/2012  -cont Xarelto  7. F/E/N  -NS 10-20 cc/hr  -will monitor electrolytes -diet ordered    8. DVT Px  -Xarelto   9. Dispo  -home possibly today or tomorrow     LOS: 2 days   Annett Gula [phone] 08/05/2012, 2:41 PM

## 2012-08-05 NOTE — Progress Notes (Signed)
Spoke with [facility].  When EMS arrived VS  140/98 HR 103 RR 18 Pulse O2 100@; placed pt on O2 2L/min [place]; 12 lead print out showed NSR, inferior and ant. T wave abnl nonspecific, iv saline lock, blood glucose 208   Makayla Frazier [phone]

## 2012-08-05 NOTE — Progress Notes (Signed)
EEG completed as ordered.

## 2012-08-06 NOTE — Procedures (Signed)
EEG NUMBER:  [id number].  INDICATION FOR STUDY:  A 41 year old lady with history of probable syncope as well as associated left-sided weakness and numbness and chest pain.  Studies being performed to rule out possible epileptic disorder.  DESCRIPTION:  This is a routine EEG recording performed during wakefulness.  Predominant background activity consisted of symmetrical, 10 Hz alpha rhythm recorded from the posterior head regions with good attenuation of alpha activity with eye opening.  Photic stimulation produced a symmetrical occipital driving response.  Hyperventilation produced a normal symmetrical transient generalized slowing response. No epileptiform discharges were recorded.  There were no areas of abnormal slowing.  INTERPRETATION:  This is a normal EEG recording during wakefulness.  No evidence of an epileptic disorder was demonstrated.     Noel Christmas, MD    [id number] D:  08/05/2012 12:02:52  T:  08/06/2012 01:14:50  Job #:  [id number]

## 2012-08-07 ENCOUNTER — Encounter: Payer: Self-pay | Admitting: *Deleted

## 2012-08-12 NOTE — Discharge Summary (Signed)
Internal Medicine Teaching [facility] Discharge Note  Name: Makayla Frazier MRN: [id number] DOB: [birth year] 40 y.o.  Date of Admission: 08/03/2012  2:29 PM Date of Discharge: 08/05/2012 Attending Physician: Dr. Meredith Pel  Discharge Diagnosis: 1) *Syncope 2) Left-sided weakness (arm,leg) 3)Chest pain 4) Uncontrolled DIABETES MELLITUS, TYPE II 5)HYPERTENSION 6) ASTHMA 7) History of deep vein thrombosis/pulmonary embolus, multiple    Discharge Medications: Medication List  As of 08/12/2012 10:35 PM   TAKE these medications         albuterol 90 MCG/ACT inhaler   Commonly known as: PROVENTIL,VENTOLIN   1-2 puffs every 4-6 hours as needed for wheezing or shortness of breath      aspirin 81 MG tablet   Take 1 tablet (81 mg total) by mouth daily.      ergocalciferol 50000 UNITS capsule   Commonly known as: VITAMIN D2   Take 50,000 Units by mouth 2 (two) times a week. Take twice a week per patient      insulin aspart 100 UNIT/ML injection   Commonly known as: novoLOG   Inject 6 Units into the skin 3 (three) times daily as needed. For blood sugar over 200 at meal time      insulin glargine 100 UNIT/ML injection   Commonly known as: LANTUS   Inject 46 Units into the skin at bedtime.      lisinopril 20 MG tablet   Commonly known as: PRINIVIL,ZESTRIL   Take 20 mg by mouth daily.      rivaroxaban 10 MG Tabs tablet   Commonly known as: XARELTO   Take 20 mg by mouth daily.            Disposition and follow-up:   Makayla Frazier was discharged from [facility] in stable condition.  At the hospital follow up visit please address 1) Syncope -Electrophysiology physician   -please comment on loop recorder reading in 02/2012 with two minute AF recording   -consider tilt table testing  2) Primary care provider -Refer for occupational therapy per recommendations one time outpatient -Address compliance with diabetes medications-hemoglobin A1C was  10.9% -repeat BMP -Advise patient whether to start Lisinopril 20 mg daily.  The dose may need to be titrated down as the patient had systolic blood pressures in the low 100s and diastolic blood pressures in the 70s this admission without medication.      Follow-up Appointments:  Discharge Orders    Future Appointments: Provider: Department: Dept Phone: Center:   08/30/2012 2:00 PM Victorino December, MD [facility] [phone] None   09/06/2012 12:00 PM Hillis Range, MD [facility] [phone] [facility]     Future Orders Please Complete By Expires   Increase activity slowly      Discharge instructions      Comments:    Appointments  1)Please do not resume Lisinopril until you get the okay from your primary care provider on 08/14/12 at 11:15 am 2) [facility] will call you with an appointment for follow up (Patient's number [phone]) 3) Please start taking ASA 81 mg qd   Call MD for:      Comments:   Another episode of passing out with weakness      Consultations:  1)Dr. Leonette Most Stewart-Neurology 2) Dr. Duke Salvia, MD-Electrophysiology 3)PT/OT   Procedures Performed:  Ct Head Wo Contrast  08/03/2012  *RADIOLOGY REPORT*  Clinical Data: Code stroke.  Syncopal episode.  Headache and left- sided weakness. Diabetes mellitus type 2.  CT HEAD  WITHOUT CONTRAST  Technique:  Contiguous axial images were obtained from the base of the skull through the vertex without contrast.  Comparison: 05/18/2012.  Findings: There is no evidence for acute infarction, intracranial hemorrhage, mass lesion, hydrocephalus, or extra-axial fluid.  Mild premature atrophy affects the cerebral cortex and cerebellum. No significant white matter disease.  No chronic lacunar infarction. The calvarium is intact.  Clear sinuses and mastoids.  Incidental dural calcification right greater than left middle cranial fossa. Similar appearance to priors.  IMPRESSION: Mild atrophy.  No visible acute  intracranial abnormality.  Critical Value/emergent results were called by telephone at the time of interpretation on 08/03/2012 at 2:45 p.m. to Dr. Weldon Inches, who verbally acknowledged these results.   Original Report Authenticated By: Elsie Stain, M.D.    Mr Angiogram Head Wo Contrast  08/03/2012  *RADIOLOGY REPORT*  Clinical Data:  Left-sided numbness and weakness.  Diabetes mellitus.  Unspecified syncopal episodes.  Obesity. Suspect acute but ill-defined cerebrovascular disease.  MRI HEAD WITHOUT CONTRAST MRA HEAD WITHOUT CONTRAST  Technique:  Multiplanar, multiecho pulse sequences of the brain and surrounding structures were obtained without intravenous contrast. Angiographic images of the head were obtained using MRA technique without contrast.  Comparison:  CT head earlier in the day.  MRI 03/18/2009.  MRI HEAD  Findings:  There is no evidence for acute infarction, intracranial hemorrhage, mass lesion, hydrocephalus, or extra-axial fluid. Premature cerebral atrophy is present with prominence of the extracerebral CSF spaces, particularly over the convexity.  Mild cerebellar atrophy is also noted.  No significant white matter disease is seen.  No foci of chronic hemorrhage. Pituitary shows mild empty sella.  No cerebellar tonsillar herniation.  Upper cervical region unremarkable.  Negative orbits, sinuses, and mastoids.  IMPRESSION: Premature atrophy without acute intracranial findings. Similar appearance to MRI of 2010.  MRA HEAD  Findings: Widely patent carotid, vertebral, and basilar arteries. Fetal origin left PCA.  No intracranial stenosis or aneurysm.  IMPRESSION: Negative exam.   Original Report Authenticated By: Elsie Stain, M.D.    Mr Brain Wo Contrast  08/03/2012  *RADIOLOGY REPORT*  Clinical Data:  Left-sided numbness and weakness.  Diabetes mellitus.  Unspecified syncopal episodes.  Obesity. Suspect acute but ill-defined cerebrovascular disease.  MRI HEAD WITHOUT CONTRAST MRA HEAD WITHOUT  CONTRAST  Technique:  Multiplanar, multiecho pulse sequences of the brain and surrounding structures were obtained without intravenous contrast. Angiographic images of the head were obtained using MRA technique without contrast.  Comparison:  CT head earlier in the day.  MRI 03/18/2009.  MRI HEAD  Findings:  There is no evidence for acute infarction, intracranial hemorrhage, mass lesion, hydrocephalus, or extra-axial fluid. Premature cerebral atrophy is present with prominence of the extracerebral CSF spaces, particularly over the convexity.  Mild cerebellar atrophy is also noted.  No significant white matter disease is seen.  No foci of chronic hemorrhage. Pituitary shows mild empty sella.  No cerebellar tonsillar herniation.  Upper cervical region unremarkable.  Negative orbits, sinuses, and mastoids.  IMPRESSION: Premature atrophy without acute intracranial findings. Similar appearance to MRI of 2010.  MRA HEAD  Findings: Widely patent carotid, vertebral, and basilar arteries. Fetal origin left PCA.  No intracranial stenosis or aneurysm.  IMPRESSION: Negative exam.   Original Report Authenticated By: Elsie Stain, M.D.    08/04/12 Carotid Dopplers Negative for stenosis. Anterograde vertebral artery flow.   2D Echo:  08/04/12 Study Conclusions - Left ventricle: The cavity size was normal. Wall thickness was normal. Systolic function was  normal. The estimated ejection fraction was in the range of 55% to 60%. Wall motion was normal; there were no regional wall motion abnormalities. Left ventricular diastolic function parameters were normal. - Inferior vena cava: The vessel was normal in size; the respirophasic diameter changes were in the normal range (= 50%); findings are consistent with normal central venous pressure.   Admission HPI:  Makayla Frazier is a 41 year old woman who presented to [facility] Emergency department on 08/03/12 after a recurrent unprovoked syncopal episode. Her last syncopal  episode was 05/18/12 and she has had other episodes of syncope in the past.  This occurrence was witnessed by her family with reported loss of consciousness by family members of 4-5 minutes.  There was no clear evidence of seizure like activity or post-ictal activity on the occurrence though her family mentions her right hand had a tremor when she was down and there was slight drool out of the right side of her mouth.   Family also reports the patient was disoriented after regaining consciousness.  The patient describes this episode of syncope as feeling hot while outside at a family gathering and then passing out.  Other occasions of syncope were associated with lightheadedness, prodrome of feeling hot, dizzy, associated with nausea possible sweating. She denies feeling unusual on 08/03/12 except for waking up feeling tired and feeling "hot" since leaving the house.  On 08/03/12 her syncopal episode was associated with left sided (face/arm/leg) weakness, numbness, tingling. She states her left leg weakness feels like when she gets she previously had a clot in her leg but reports compliance with Xarelto as instructed. When assessed in the emergency room the patient feels okay. She did eat breakfast (bagel) and reports taking her all of her medications at night. Other review of systems positive for the patient feeling cold all of the time, history left eye blurriness (patient wears glasses), history of chest pain with exertion, history of mid to left chest pain (not new) feels like tightness worse with cold drink,  loop recorder in left chest since 2011 (being removed in 08/2012), history of shortness of breath with exertion and at night.  Also, the patient has an IUD device in place to be removed 08/2012.    [facility] noted patient had vitals: 140/98 HR 103 RR 18 Pulse O2 100%.  They placed patient on O2 2L/min [place]; 12 lead print out EKG reading showed normal sinus rhythm, inferior and ant. T wave abnl  nonspecific, iv saline lock, blood glucose was 208.   Social History: Patient denies smoking, alcohol, drugs.  She has 10th grade education.  She lives with her significant other. PCP is Elpidio Anis for the last 3-4 months.     Hospital Course by problem list: Makayla Frazier is a 41 year old woman significant past medical history of thromboembolic events (PE, DVT) induced by oral contraceptive pills, diabetes 2, asthma, obstructive sleep apnea, major depression with history of suicide attempt 2009, hypertension, presents for recurrent syncopal episode associated with left sided paresthesias and left sided hemiparesis (arm/leg) with complaints of chest pain.   1) *Syncope Recurrent episode of syncope associated with a sensation of feeling hot this episode, left sided weakness (arm/leg), paresthesias, and decreased sensation left face, arm, leg.  This episode of syncope was initially thought to be related to a transient ischemia attack (TIA) or seizure.  All imaging studies for TIA work up were negative including CT head, MRI/A brain, Carotid Dopplers, 2D echo with  ejection fraction 55-60%, EKG showed normal sinus rhythm, and orthostatic vital signs were negative.  An EEG was performed 08/05/12 and was negative for evidence of seizure like activity.  Urine drug screen was also negative.  Neurology evaluated the patient on admission and did not notice any decompensation in motor strength on the left but noted a reduction in the perception of tactile sensation to touch and pinprick over her left face, arm, and leg compared to the right.  The etiology of Makayla Frazier syncopal episode is still unclear to date and she wanted to be discharged 08/05/12.  At this time it is not thought to be due to neurological or cardiac etiologies.  Makayla Frazier syncopal episodes are associated with her feeling hot and then fainting.  This could be situational or vasovagal as other etiologies such as a transient ischemia attack or  seizure or arrhythmia were negative.  Her loop monitor since 2011 was interrogated by a [employer] representative this admission.  There were no events to correlate with this syncopal episode.  From the interrogation as well it was noted that the patient did have an atrial fibrillation recording x 2 minutes on 02/2012 with a median ventricular rate of 128. No EKG was done at that time because the atrial fibrillation setting was turned off.  Physical therapy and Occupational therapy saw the patient this admission with recommendations for a cane at discharge.   Physical therapy evaluated her this admission and noted left hemiparesis with minimal impact to functional mobility and recommended a cane for increased feeling of stability.  No follow up physical therapy was needed.  Occupational therapy saw the patient once this admission and recommended one more outpatient follow up.  A tilt table test can be of use in the future per electrophysiology to determine a vasomotor source of syncope.  If her blood pressure is stable, neuropsychiatric explanations would need to be pursued.  If her blood pressure is low during the tilt table test then that would justify a positive result and further treatment.  Electrophysiology also recommends the removal of the patient's loop recorder.     2) Left-sided weakness See #1  3)Chest pain Patient describes chest pain prior to admission.  Associated with chest tightness and chest pain with exertion.  Cardiac enzymes were cycled this admission x 4 and negative.  EKG was normal sinus rhythm.  Etiology is likely noncardiac.      4) Uncontrolled DIABETES MELLITUS, TYPE II Makayla Frazier's hemoglobin A1C was 10.9% this admission.  This is trending down according to Makayla Frazier.   Her medication compliance needs to continue to be addressed in order to get her diabetes under control.  She was given Lantus 46 units a night and Novolog 6 units three times a day with meals, both home doses.      5)HYPERTENSION We held her Lisinopril 20 mg daily this admission in the setting of work up for syncope and her blood pressure want not significantly elevated without blood pressure medication.  Her primary care provider should advise her on resuming this medication and consider titration of the dose.    6) ASTHMA She was continued on her Albuterol inhaler as needed.   7) History of deep vein thrombosis/pulmonary embolus, multiple These events were related to oral contraceptive use for dysfunctional uterine bleeding in the past.  Makayla Frazier still has an intrauterine device (IUD) intact which she reports she is getting removed in 08/2012.  Her last deep vein thrombosis was in 05/2012.  She was seen by Hematology/Oncology and prescribed Xarelto.  She reports compliance with the medication since initiation.    Discharge Vitals:  BP 129/91  Pulse 87  Temp 98.3 F (36.8 C) (Oral)  Resp 18  Ht 5' (1.524 m)  Wt 169 lb 1.5 oz (76.7 kg)  BMI 33.02 kg/m2  SpO2 99%  LMP 07/02/2012  Discharge Labs: Results for DRUSCILLA, PETSCH (MRN [id number]) as of 08/12/2012 22:37  Ref. Range 08/03/2012 14:28  Sodium Latest Range: 135-145 mEq/L 134 (L)  Potassium Latest Range: 3.5-5.1 mEq/L 3.0 (L)  Chloride Latest Range: 96-112 mEq/L 99  CO2 Latest Range: 19-32 mEq/L 24  BUN Latest Range: 6-23 mg/dL 10  Creatinine Latest Range: 0.50-1.10 mg/dL 4.13  Calcium Latest Range: 8.4-10.5 mg/dL 9.2  GFR calc non Af Amer Latest Range: >90 mL/min >90  GFR calc Af Amer Latest Range: >90 mL/min >90  Glucose Latest Range: 70-99 mg/dL 244 (H)  Alkaline Phosphatase Latest Range: 39-117 U/L 54  Albumin Latest Range: 3.5-5.2 g/dL 3.6  AST Latest Range: 0-37 U/L 15  ALT Latest Range: 0-35 U/L 19  Total Protein Latest Range: 6.0-8.3 g/dL 7.6  Total Bilirubin Latest Range: 0.3-1.2 mg/dL 0.3   Results for GRICELDA, FOLAND (MRN [id number]) as of 08/12/2012 22:37  Ref. Range 08/03/2012 14:31 08/03/2012 14:45 08/03/2012 20:40  08/03/2012 20:41 08/04/2012 04:21 08/04/2012 04:50 08/04/2012 10:23  CK, MB Latest Range: 0.3-4.0 ng/mL 1.9  1.7  1.5  1.5  CK Total Latest Range: 7-177 U/L 171  144  115  124  Troponin I Latest Range: <0.30 ng/mL <0.30  <0.30  <0.30  <0.30   Results for SIMRANJIT, THAYER (MRN [id number]) as of 08/12/2012 22:37  Ref. Range 08/04/2012 04:50  Cholesterol Latest Range: 0-200 mg/dL 595  Triglycerides Latest Range: <150 mg/dL 99  HDL Latest Range: >63 mg/dL 36 (L)  LDL (calc) Latest Range: 0-99 mg/dL 90  VLDL Latest Range: 0-40 mg/dL 20  Total CHOL/HDL Ratio No range found 4.1    Results for MAGDALENA, SKILTON (MRN [id number]) as of 08/12/2012 22:37  Ref. Range 08/03/2012 14:28 08/03/2012 14:45 08/04/2012 10:23 08/04/2012 18:09  WBC Latest Range: 4.0-10.5 K/uL 9.9   9.4  RBC Latest Range: 3.87-5.11 MIL/uL 4.75   4.33  Hemoglobin Latest Range: 12.0-15.0 g/dL 51.8 84.1  66.0 (L)  HCT Latest Range: 36.0-46.0 % 36.8 40.0  34.0 (L)  MCV Latest Range: 78.0-100.0 fL 77.5 (L)   78.5  MCH Latest Range: 26.0-34.0 pg 25.9 (L)   25.6 (L)  MCHC Latest Range: 30.0-36.0 g/dL 63.0   16.0  RDW Latest Range: 11.5-15.5 % 14.3   14.6  Platelets Latest Range: 150-400 K/uL 304   283   Results for THECLA, FORGIONE (MRN [id number]) as of 08/12/2012 22:37  Ref. Range 08/03/2012 14:28  Prothrombin Time Latest Range: 11.6-15.2 seconds 15.0  INR Latest Range: 0.00-1.49  1.16  aPTT Latest Range: 24-37 seconds 27   Results for JAHNE, KRUKOWSKI (MRN [id number]) as of 08/12/2012 22:37  Ref. Range 08/04/2012 10:23  Iron Latest Range: 42-135 ug/dL 85  UIBC Latest Range: 125-400 ug/dL 062  TIBC Latest Range: 250-470 ug/dL 376  Saturation Ratios Latest Range: 20-55 % 32  Ferritin Latest Range: 10-291 ng/mL 136  Folate No range found >20.0  Vitamin B-12 Latest Range: 211-911 pg/mL 498   Results for VIVIENNE, SANGIOVANNI (MRN [id number]) as of 08/12/2012 22:37  Ref. Range 08/04/2012 10:23  Phosphorus Latest Range: 2.3-4.6 mg/dL 2.0 (L)    Magnesium Latest  Range: 1.5-2.5 mg/dL 2.1   Results for BRENDY, FICEK (MRN [id number]) as of 08/12/2012 22:37  Ref. Range 08/05/2012 04:55  Sodium Latest Range: 135-145 mEq/L 136  Potassium Latest Range: 3.5-5.1 mEq/L 3.6  Chloride Latest Range: 96-112 mEq/L 104  CO2 Latest Range: 19-32 mEq/L 23  BUN Latest Range: 6-23 mg/dL 7  Creatinine Latest Range: 0.50-1.10 mg/dL 4.09  Calcium Latest Range: 8.4-10.5 mg/dL 8.9  GFR calc non Af Amer Latest Range: >90 mL/min >90  GFR calc Af Amer Latest Range: >90 mL/min >90  Glucose Latest Range: 70-99 mg/dL 811 (H)   Results for ROSELIA, SNIPE (MRN [id number]) as of 08/12/2012 22:37  Ref. Range 08/03/2012 20:41  Hemoglobin A1C Latest Range: <5.7 % 10.9 (H)  TSH Latest Range: 0.350-4.500 uIU/mL 1.056   Results for TAYNA, SMETHURST (MRN [id number]) as of 08/12/2012 22:37  Ref. Range 08/03/2012 20:33  Color, Urine Latest Range: YELLOW  YELLOW  APPearance Latest Range: CLEAR  CLEAR  Specific Gravity, Urine Latest Range: 1.005-1.030  1.041 (H)  pH Latest Range: 5.0-8.0  5.5  Glucose Latest Range: NEGATIVE mg/dL >4696 (A)  Bilirubin Urine Latest Range: NEGATIVE  NEGATIVE  Ketones, ur Latest Range: NEGATIVE mg/dL 40 (A)  Protein Latest Range: NEGATIVE mg/dL NEGATIVE  Urobilinogen, UA Latest Range: 0.0-1.0 mg/dL 0.2  Nitrite Latest Range: NEGATIVE  NEGATIVE  Leukocytes, UA Latest Range: NEGATIVE  NEGATIVE  Hgb urine dipstick Latest Range: NEGATIVE  NEGATIVE  Squamous Epithelial / LPF Latest Range: RARE  RARE   Results for DEVOTA, VIRUET (MRN [id number]) as of 08/12/2012 22:37  Ref. Range 08/03/2012 20:33  AMPHETAMINES Latest Range: NONE DETECTED  NONE DETECTED  Barbiturates Latest Range: NONE DETECTED  NONE DETECTED  Benzodiazepines Latest Range: NONE DETECTED  NONE DETECTED  Opiates Latest Range: NONE DETECTED  NONE DETECTED  COCAINE Latest Range: NONE DETECTED  NONE DETECTED  Tetrahydrocannabinol Latest Range: NONE DETECTED  NONE DETECTED    Results for SHANIQUIA, BRAFFORD (MRN [id number]) as of 08/12/2012 22:37  Ref. Range 08/03/2012 20:41  Alcohol, Ethyl (B) Latest Range: 0-11 mg/dL <36    Signed: Annett Gula   Time Spent on Discharge: <30 minutes

## 2012-08-30 ENCOUNTER — Ambulatory Visit (HOSPITAL_BASED_OUTPATIENT_CLINIC_OR_DEPARTMENT_OTHER): Payer: Medicare Other | Admitting: Oncology

## 2012-08-30 ENCOUNTER — Ambulatory Visit (HOSPITAL_COMMUNITY)
Admission: RE | Admit: 2012-08-30 | Discharge: 2012-08-30 | Disposition: A | Discharge status: Home / Self Care | Payer: Medicare Other | Source: Ambulatory Visit | Attending: Oncology | Admitting: Oncology

## 2012-08-30 ENCOUNTER — Encounter: Payer: Self-pay | Admitting: Oncology

## 2012-08-30 VITALS — BP 155/110 | HR 103 | Temp 98.9°F | Resp 20 | Ht 60.0 in | Wt 174.8 lb

## 2012-08-30 DIAGNOSIS — J438 Other emphysema: Secondary | ICD-10-CM | POA: Insufficient documentation

## 2012-08-30 DIAGNOSIS — Z7901 Long term (current) use of anticoagulants: Secondary | ICD-10-CM | POA: Insufficient documentation

## 2012-08-30 DIAGNOSIS — I2699 Other pulmonary embolism without acute cor pulmonale: Secondary | ICD-10-CM

## 2012-08-30 DIAGNOSIS — Z86718 Personal history of other venous thrombosis and embolism: Secondary | ICD-10-CM

## 2012-08-30 DIAGNOSIS — D6859 Other primary thrombophilia: Secondary | ICD-10-CM

## 2012-08-30 DIAGNOSIS — J984 Other disorders of lung: Secondary | ICD-10-CM | POA: Insufficient documentation

## 2012-08-30 DIAGNOSIS — D649 Anemia, unspecified: Secondary | ICD-10-CM

## 2012-08-30 DIAGNOSIS — I825Z9 Chronic embolism and thrombosis of unspecified deep veins of unspecified distal lower extremity: Secondary | ICD-10-CM | POA: Insufficient documentation

## 2012-08-30 DIAGNOSIS — I2782 Chronic pulmonary embolism: Secondary | ICD-10-CM | POA: Insufficient documentation

## 2012-08-30 DIAGNOSIS — K7689 Other specified diseases of liver: Secondary | ICD-10-CM | POA: Insufficient documentation

## 2012-08-30 DIAGNOSIS — Z9089 Acquired absence of other organs: Secondary | ICD-10-CM | POA: Insufficient documentation

## 2012-08-30 MED ORDER — IOHEXOL 350 MG/ML SOLN
100.0000 mL | Freq: Once | INTRAVENOUS | Status: AC | PRN
  Administered 2012-08-30: 80 mL via INTRAVENOUS

## 2012-08-30 NOTE — Progress Notes (Signed)
VASCULAR LAB PRELIMINARY  PRELIMINARY  PRELIMINARY  PRELIMINARY  Bilateral lower extremity venous duplex has been completed.    Preliminary report:  Right leg is negative for acute DVT.  Left leg is negative for acute DVT.  The peroneal vein is positive for chronic thrombous starting in the distal calf extending to approximately mid calf.                                Florestine Avers, 08/30/2012, 5:44 PM

## 2012-08-30 NOTE — Patient Instructions (Addendum)
Continue your xeralto for now  We get CT angio and doppler of legs to see if your bloods have resolved  I will see you back in 1 month

## 2012-09-02 ENCOUNTER — Telehealth: Payer: Self-pay | Admitting: Oncology

## 2012-09-02 NOTE — Telephone Encounter (Signed)
S/w the pt and she is aware of her oct appts °

## 2012-09-06 ENCOUNTER — Encounter: Payer: Self-pay | Admitting: Internal Medicine

## 2012-09-06 ENCOUNTER — Ambulatory Visit (INDEPENDENT_AMBULATORY_CARE_PROVIDER_SITE_OTHER): Payer: Medicare Other | Admitting: Internal Medicine

## 2012-09-06 VITALS — BP 147/92 | HR 85 | Ht 60.0 in | Wt 174.0 lb

## 2012-09-06 DIAGNOSIS — R55 Syncope and collapse: Secondary | ICD-10-CM

## 2012-09-06 NOTE — Patient Instructions (Addendum)
Your physician wants you to follow-up in: 1 year with Dr Johney Frame. You will receive a reminder letter in the mail two months in advance. If you don't receive a letter, please call our office to schedule the follow-up appointment.  Remote monitoring is used to monitor your Pacemaker of ICD from home. This monitoring reduces the number of office visits required to check your device to one time per year. It allows [place] to keep an eye on the functioning of your device to ensure it is working properly. You are scheduled for a device check from home on 3 MONTHS. You may send your transmission at any time that day. If you have a wireless device, the transmission will be sent automatically. After your physician reviews your transmission, you will receive a postcard with your next transmission date.  NO DRIVING TILL YOU ARE TOLD OTHERWISE BY DR Johney Frame!

## 2012-09-08 NOTE — Progress Notes (Signed)
OFFICE PROGRESS NOTE  CC  Makayla Carina, PA-C [address] [place] [address] Dr. Dierdre Forth  DIAGNOSIS: 41 year old female with hypercoagulability presented with pulmonary embolism last PE was May 18 2012. Patient is currently on Xeralto. She is awaiting a hysterectomy for management of her dysfunctional uterine bleeding.  PRIOR THERAPY:Graph #1 patient has a history of left DVT on pulmonary embolism in 2009 after being on birth control pills. For that she was treated for one year.  #2 in 2010 patient developed a second DVT but she remained off anticoagulation. Due to the dysfunction uterine bleeding patient has been on Miranda IUD since 2011.  #3 in 05/18/2012 patient developed pulmonary embolism again. She was begun on anticoagulation with xeralto. Patient will eventually have a hysterectomy for management of her dysfunctional uterine bleeding.  CURRENT THERAPY:xeralto  INTERVAL HISTORY: Makayla Frazier 41 y.o. female returns for Followup visit today. Overall she is doing well. Patient was recently again seen in the emergency room with what sounds like possibly dehydration and a syncopal episode. She has not had any bleeding she is tolerating her anticoagulant quite nicely. Patient does continue to have some shortness of breath. She also tells me that she is continuing to have certain amount of bleeding vaginally but all in all her periods are stable she still is on the Mirena ring.She has not had any chest pains no palpitations no bleeding problems no easy bruising. Remainder of the 10 point review of systems is negative MEDICAL HISTORY: Past Medical History  Diagnosis Date  . PE (pulmonary embolism)     2009 - on oral contraception; multiple  . Syncopal episodes     unknown etiology  . DM (diabetes mellitus)   . Dysfunctional uterine bleeding     Seen by Dr. Pennie Rushing, GYN; pending hysterectomy as of 07/2012  . Asthma   . OSA (obstructive sleep apnea)   . Major depression      Hx suicide attempt in 2009, [facility] admissions  . Syncope and collapse     unknow etiology extensive w/u cardiology  . Obesity   . Mastodynia   . Hypertension   . Post - coital bleeding 2012  . Labial lesion 2013  . BV (bacterial vaginosis) 2012  . Oligomenorrhea 2011  . DVT (deep venous thrombosis) 2011    pt had IUD; also 05/2012  . H/O hypercoagulable state     2/2 contraception  . HLD (hyperlipidemia)     ALLERGIES:  is allergic to codeine; darvocet; hydrocodone-acetaminophen; latex; oxycodone-acetaminophen; and percocet.  MEDICATIONS:  Current Outpatient Prescriptions  Medication Sig Dispense Refill  . albuterol (PROVENTIL,VENTOLIN) 90 MCG/ACT inhaler 1-2 puffs every 4-6 hours as needed for wheezing or shortness of breath       . insulin aspart (NOVOLOG) 100 UNIT/ML injection Inject 6 Units into the skin 3 (three) times daily as needed. For blood sugar over 200 at meal time      . insulin glargine (LANTUS) 100 UNIT/ML injection Inject 46 Units into the skin at bedtime.  10 mL  1  . lisinopril (PRINIVIL,ZESTRIL) 20 MG tablet Take 20 mg by mouth daily.      . rivaroxaban (XARELTO) 10 MG TABS tablet Take 20 mg by mouth daily.        SURGICAL HISTORY:  Past Surgical History  Procedure Date  . Breast reduction surgery     Dr. Kathie Dike [facility] 2011  . Cholecystectomy   . Cardiac electrophysiology study & dft     Implantable  Loop recorder  . Endometrial biopsy 2011    REVIEW OF SYSTEMS:  Pertinent items are noted in HPI.   PHYSICAL EXAMINATION:  Gen.: Well-developed well-nourished female in no acute distress. Lungs: Distant breath sounds Extremities +1 edema Neuro: Nonfocal Skin: No bruises or rashes ECOG PERFORMANCE STATUS: 1 - Symptomatic but completely ambulatory  Blood pressure 155/110, pulse 103, temperature 98.9 F (37.2 C), temperature source Oral, resp. rate 20, height 5' (1.524 m), weight 174 lb 12.8 oz (79.289 kg), last menstrual period  07/02/2012.  LABORATORY DATA: Lab Results  Component Value Date   WBC 9.4 08/04/2012   HGB 11.1* 08/04/2012   HCT 34.0* 08/04/2012   MCV 78.5 08/04/2012   PLT 283 08/04/2012      Chemistry      Component Value Date/Time   NA 136 08/05/2012 0455   K 3.6 08/05/2012 0455   CL 104 08/05/2012 0455   CO2 23 08/05/2012 0455   BUN 7 08/05/2012 0455   CREATININE 0.59 08/05/2012 0455   CREATININE 0.64 01/26/2011 1030      Component Value Date/Time   CALCIUM 8.9 08/05/2012 0455   ALKPHOS 54 08/03/2012 1428   AST 15 08/03/2012 1428   ALT 19 08/03/2012 1428   BILITOT 0.3 08/03/2012 1428       RADIOGRAPHIC STUDIES:  No results found.  ASSESSMENT: 41 year old female with hypercoagulability. Patient had a complete hypoechoic panel performed which was negative. Thus I do believe that her hypercoagulability and her recurrent thrombosis is due to contraception.She is now on several toe tolerating it well without any evidence of bleeding.   PLAN:  #1 we will get CT angiograms as well as Doppler studies performed to evaluate whether patient has had any resolution of her pulmonary embolism.  #2 once I have the results then my recommendation would be depending on the results to either continue these are also for another few months or to proceed with her gynecologic surgery.  All questions were answered. The patient knows to call the clinic with any problems, questions or concerns. We can certainly see the patient much sooner if necessary.  I spent 15 minutes counseling the patient face to face. The total time spent in the appointment was 30 minutes.    Drue Second, MD Medical/Oncology [facility] [phone] (beeper) [phone] (Office)  09/08/2012, 8:38 PM

## 2012-09-09 ENCOUNTER — Encounter: Payer: Self-pay | Admitting: Internal Medicine

## 2012-09-09 NOTE — Progress Notes (Signed)
PCP:  Windy Carina, PA-C  The patient presents today for routine electrophysiology followup.   She continues to have episodes of syncope of unclear cause.  Her loop recorder has effectively excluded arrhythmia as a cause.  Episodes are not postural.  Recent EEG was unremarkable.  She does not drive.  Today, she denies symptoms of palpitations, chest pain, shortness of breath, lower extremity edema, or other concerns.  Past Medical History  Diagnosis Date  . PE (pulmonary embolism)     2009 - on oral contraception; multiple  . Syncopal episodes     unknown etiology  . DM (diabetes mellitus)   . Dysfunctional uterine bleeding     Seen by Dr. Pennie Rushing, GYN; pending hysterectomy as of 07/2012  . Asthma   . OSA (obstructive sleep apnea)   . Major depression     Hx suicide attempt in 2009, [facility] admissions  . Obesity   . Mastodynia   . Hypertension   . Post - coital bleeding 2012  . Labial lesion 2013  . BV (bacterial vaginosis) 2012  . Oligomenorrhea 2011  . DVT (deep venous thrombosis) 2011    pt had IUD; also 05/2012  . H/O hypercoagulable state     2/2 contraception  . HLD (hyperlipidemia)    Past Surgical History  Procedure Date  . Breast reduction surgery     Dr. Kathie Dike [facility] 2011  . Cholecystectomy   . Cardiac electrophysiology study & dft     Implantable Loop recorder  . Endometrial biopsy 2011    Current Outpatient Prescriptions  Medication Sig Dispense Refill  . albuterol (PROVENTIL,VENTOLIN) 90 MCG/ACT inhaler 1-2 puffs every 4-6 hours as needed for wheezing or shortness of breath       . insulin aspart (NOVOLOG) 100 UNIT/ML injection Inject 6 Units into the skin 3 (three) times daily as needed. For blood sugar over 200 at meal time      . insulin glargine (LANTUS) 100 UNIT/ML injection Inject 46 Units into the skin at bedtime.  10 mL  1  . lisinopril (PRINIVIL,ZESTRIL) 20 MG tablet Take 20 mg by mouth daily.      . rivaroxaban (XARELTO) 10 MG TABS tablet Take 20 mg by  mouth daily.        Allergies  Allergen Reactions  . Codeine     Fever   . Darvocet (Propoxyphene-Acetaminophen)     vomiting  . Hydrocodone-Acetaminophen     Vomiting   . Latex     rash  . Oxycodone-Acetaminophen     vomiting  . Percocet (Oxycodone-Acetaminophen)     vomiting    History   Social History  . Marital Status: Single    Spouse Name: N/A    Number of Children: N/A  . Years of Education: N/A   Occupational History  . 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 Smoking status: Never Smoker   . Smokeless tobacco: Never Used  . Alcohol Use: No  . Drug Use: No  . Sexually Active: Yes    Birth Control/ Protection: IUD     Mirena    Other Topics Concern  . Not on file   Social History Narrative   Lives in [place] with significant other, WilliamCompleted 10th grade.Worker Compensation Case 2009-2012 related to PE    Family History  Problem Relation Age of Onset  . Melanoma Father 13  . Ulcerative colitis Mother 67  .  Breast cancer Paternal Grandmother   . Diabetes type II Maternal Grandmother     deceased 34  . Pulmonary embolism Paternal Grandfather    Physical Exam: Filed Vitals:   09/06/12 1214  BP: 147/92  Pulse: 85  Height: 5' (1.524 m)  Weight: 174 lb (78.926 kg)  SpO2: 97%    GEN- The patient is well appearing, alert and oriented x 3 today.   Head- normocephalic, atraumatic Eyes-  Sclera clear, conjunctiva pink Ears- hearing intact Oropharynx- clear Neck- supple, no JVP Lymph- no cervical lymphadenopathy Lungs- Clear to ausculation bilaterally, normal work of breathing Heart- Regular rate and rhythm, no murmurs, rubs or gallops, PMI not laterally displaced GI- soft, NT, ND, + BS Extremities- no clubbing, cyanosis, or edema  ILR reveals no arrhythmias   Assessment and Plan:

## 2012-09-09 NOTE — Assessment & Plan Note (Signed)
Unclear etiology, though I do not think that these are cardiac in etiology.  I would anticipate that seizure or pseudoseizure activity may be most likely.  My suspicion for dysautonomia is low.  As per Dr Odessa Fleming recent evaluation, I have offered tilt testing.  At this time she declines.  She will contact my office if she wishes to proceed with tilt testing.  In the interim, adequate hydation is encouraged. I have been very clear with her today that she cannot drive.

## 2012-10-04 ENCOUNTER — Ambulatory Visit (HOSPITAL_BASED_OUTPATIENT_CLINIC_OR_DEPARTMENT_OTHER): Payer: Medicare Other | Admitting: Oncology

## 2012-10-04 ENCOUNTER — Telehealth: Payer: Self-pay | Admitting: Oncology

## 2012-10-04 VITALS — BP 115/79 | HR 91 | Temp 98.3°F | Resp 20 | Ht 60.0 in | Wt 179.5 lb

## 2012-10-04 DIAGNOSIS — Z86711 Personal history of pulmonary embolism: Secondary | ICD-10-CM

## 2012-10-04 DIAGNOSIS — D6859 Other primary thrombophilia: Secondary | ICD-10-CM

## 2012-10-04 DIAGNOSIS — I2699 Other pulmonary embolism without acute cor pulmonale: Secondary | ICD-10-CM

## 2012-10-04 NOTE — Telephone Encounter (Signed)
gve the pt her dec 2013 appt calendar °

## 2012-10-04 NOTE — Telephone Encounter (Signed)
lmonvm adviisng the pt that her appt has been moved from 11/15/2012 to 11/11/2012@12 :noon

## 2012-10-04 NOTE — Patient Instructions (Addendum)
Continue xeralto  You will likely come off of anti-coagulation and proceed with surgery in Early December 2013

## 2012-10-13 NOTE — Progress Notes (Signed)
OFFICE PROGRESS NOTE  CC  Makayla Carina, PA-C [address] [place] [address] Dr. Dierdre Forth  DIAGNOSIS: 41 year old female with hypercoagulability presented with pulmonary embolism last PE was May 18 2012. Patient is currently on Xeralto. She is awaiting a hysterectomy for management of her dysfunctional uterine bleeding.  PRIOR THERAPY:Graph #1 patient has a history of left DVT on pulmonary embolism in 2009 after being on birth control pills. For that she was treated for one year.  #2 in 2010 patient developed a second DVT but she remained off anticoagulation. Due to the dysfunction uterine bleeding patient has been on Miranda IUD since 2011.  #3 in 05/18/2012 patient developed pulmonary embolism again. She was begun on anticoagulation with xeralto. Patient will eventually have a hysterectomy for management of her dysfunctional uterine bleeding.  CURRENT THERAPY: xeralto  INTERVAL HISTORY: Makayla Frazier 41 y.o. female returns for Followup visit today. Overall she is doing well. Patient was recently again seen in the emergency room with what sounds like possibly dehydration and a syncopal episode. She has not had any bleeding she is tolerating her anticoagulant quite nicely. Patient does continue to have some shortness of breath. She also tells me that she is continuing to have certain amount of bleeding vaginally but all in all her periods are stable she still is on the Mirena ring.She has not had any chest pains no palpitations no bleeding problems no easy bruising. Remainder of the 10 point review of systems is negative MEDICAL HISTORY: Past Medical History  Diagnosis Date  . PE (pulmonary embolism)     2009 - on oral contraception; multiple  . Syncopal episodes     unknown etiology  . DM (diabetes mellitus)   . Dysfunctional uterine bleeding     Seen by Dr. Pennie Rushing, GYN; pending hysterectomy as of 07/2012  . Asthma   . OSA (obstructive sleep apnea)   . Major  depression     Hx suicide attempt in 2009, [facility] admissions  . Obesity   . Mastodynia   . Hypertension   . Post - coital bleeding 2012  . Labial lesion 2013  . BV (bacterial vaginosis) 2012  . Oligomenorrhea 2011  . DVT (deep venous thrombosis) 2011    pt had IUD; also 05/2012  . H/O hypercoagulable state     2/2 contraception  . HLD (hyperlipidemia)     ALLERGIES:  is allergic to codeine; darvocet; hydrocodone-acetaminophen; latex; oxycodone-acetaminophen; and percocet.  MEDICATIONS:  Current Outpatient Prescriptions  Medication Sig Dispense Refill  . acetaminophen-codeine (TYLENOL #2) 300-15 MG per tablet Take 1 tablet by mouth every 4 (four) hours as needed.      Marland Kitchen albuterol (PROVENTIL,VENTOLIN) 90 MCG/ACT inhaler 1-2 puffs every 4-6 hours as needed for wheezing or shortness of breath       . insulin aspart (NOVOLOG) 100 UNIT/ML injection Inject 6 Units into the skin 3 (three) times daily as needed. For blood sugar over 200 at meal time      . insulin glargine (LANTUS) 100 UNIT/ML injection Inject 46 Units into the skin at bedtime.  10 mL  1  . lisinopril (PRINIVIL,ZESTRIL) 20 MG tablet Take 20 mg by mouth daily.      . rivaroxaban (XARELTO) 10 MG TABS tablet Take 20 mg by mouth daily.        SURGICAL HISTORY:  Past Surgical History  Procedure Date  . Breast reduction surgery     Dr. Kathie Dike [facility] 2011  . Cholecystectomy   .  Cardiac electrophysiology study & dft     Implantable Loop recorder  . Endometrial biopsy 2011    REVIEW OF SYSTEMS:  Pertinent items are noted in HPI.   PHYSICAL EXAMINATION:  Gen.: Well-developed well-nourished female in no acute distress. Lungs: Distant breath sounds Extremities +1 edema Neuro: Nonfocal Skin: No bruises or rashes ECOG PERFORMANCE STATUS: 1 - Symptomatic but completely ambulatory  Blood pressure 115/79, pulse 91, temperature 98.3 F (36.8 C), temperature source Oral, resp. rate 20, height 5' (1.524 m), weight 179 lb 8 oz  (81.421 kg).  LABORATORY DATA: Lab Results  Component Value Date   WBC 9.4 08/04/2012   HGB 11.1* 08/04/2012   HCT 34.0* 08/04/2012   MCV 78.5 08/04/2012   PLT 283 08/04/2012      Chemistry      Component Value Date/Time   NA 136 08/05/2012 0455   K 3.6 08/05/2012 0455   CL 104 08/05/2012 0455   CO2 23 08/05/2012 0455   BUN 7 08/05/2012 0455   CREATININE 0.59 08/05/2012 0455   CREATININE 0.64 01/26/2011 1030      Component Value Date/Time   CALCIUM 8.9 08/05/2012 0455   ALKPHOS 54 08/03/2012 1428   AST 15 08/03/2012 1428   ALT 19 08/03/2012 1428   BILITOT 0.3 08/03/2012 1428       RADIOGRAPHIC STUDIES:  No results found.  ASSESSMENT: 41 year old female with hypercoagulability. Patient had a complete hypoechoic panel performed which was negative. Thus I do believe that her hypercoagulability and her recurrent thrombosis is due to contraception.She is now on xeralto tolerating it well without any evidence of bleeding. Patient has had several hospitalizations and emergency room visits for syncopal episodes. She is now seeing a neurologist. And a workup is ensuing for the cause of this.   PLAN: #1 patient will continue xeralto for now.  #2 I am concerned about patient's ongoing issues with syncopal episodes. She is undergoing a full workup. At this time my recommendation is to continue the anticoagulation until her neurologic workup is completed.  #3 I anticipate that she may be able to have her gynecologic surgery performed sometime in December or early next year. By that time she will have completed a total of 6-7 months of anticoagulation. All questions were answered. The patient knows to call the clinic with any problems, questions or concerns. We can certainly see the patient much sooner if necessary.  I spent 15 minutes counseling the patient face to face. The total time spent in the appointment was 30 minutes.    Drue Second, MD Medical/Oncology [facility] [phone] (beeper) [phone] (Office)

## 2012-10-15 ENCOUNTER — Ambulatory Visit (INDEPENDENT_AMBULATORY_CARE_PROVIDER_SITE_OTHER): Payer: Medicaid Other | Admitting: Obstetrics and Gynecology

## 2012-10-15 ENCOUNTER — Encounter: Payer: Self-pay | Admitting: Obstetrics and Gynecology

## 2012-10-15 VITALS — BP 144/90 | Wt 183.0 lb

## 2012-10-15 DIAGNOSIS — Z975 Presence of (intrauterine) contraceptive device: Secondary | ICD-10-CM

## 2012-10-15 DIAGNOSIS — N949 Unspecified condition associated with female genital organs and menstrual cycle: Secondary | ICD-10-CM

## 2012-10-15 DIAGNOSIS — N760 Acute vaginitis: Secondary | ICD-10-CM

## 2012-10-15 DIAGNOSIS — N898 Other specified noninflammatory disorders of vagina: Secondary | ICD-10-CM

## 2012-10-15 DIAGNOSIS — B9689 Other specified bacterial agents as the cause of diseases classified elsewhere: Secondary | ICD-10-CM

## 2012-10-15 DIAGNOSIS — A499 Bacterial infection, unspecified: Secondary | ICD-10-CM

## 2012-10-15 DIAGNOSIS — R102 Pelvic and perineal pain: Secondary | ICD-10-CM

## 2012-10-15 LAB — POCT WET PREP (WET MOUNT)
Whiff Test: POSITIVE
pH: 5.5

## 2012-10-15 LAB — POCT URINALYSIS DIPSTICK
Glucose, UA: >4
Leukocytes, UA: NEGATIVE
Nitrite, UA: NEGATIVE
Protein, UA: NEGATIVE
Spec Grav, UA: >=1.03
Urobilinogen, UA: NEGATIVE

## 2012-10-15 MED ORDER — METRONIDAZOLE 0.75 % VA GEL: 1.0000 | Freq: Two times a day (BID) | VAGINAL | Status: DC

## 2012-10-15 MED ORDER — FLUCONAZOLE 150 MG PO TABS: 150.0000 mg | ORAL_TABLET | Freq: Once | ORAL | Status: AC

## 2012-10-15 NOTE — Progress Notes (Signed)
40 YO for vaginal discharge evaluation due to symptoms for 1 week.  O: Pelvic: EGBUS-wnl, vagina-moderate malodorous grey discharge, cervix-no lesions and string visible, uterus/adnexae-normal  Wet Prep: pH-5.5,  whiff-positive,  many clue cells  A: Bacterial Vaginosis     Elevated Bp     Poorly Controlled Diabetes   P: Metroget Vaginal #1 1 appl. pv qd x 5 days no refills     Diflucan 150 mg #1 1 po stat yeast prophalaxis     Patient awaiting scheduling from Dr. Pennie Rushing for hysterectomy     Being weaned off blood thinners (Xarelto)    RTO-as scheduled or prn  Autumm Hattery, PA-C

## 2012-10-15 NOTE — Progress Notes (Signed)
Color: white Odor: yes Itching:yes Thin:yes Thick:yes Fever:no Dyspareunia:no Hx PID:no HX STD:no Pelvic Pain:yes Desires Gc/CT:no Desires HIV,RPR,HbsAG:no

## 2012-10-15 NOTE — Patient Instructions (Signed)
Bacterial Vaginosis Bacterial vaginosis (BV) is a vaginal infection where the normal balance of bacteria in the vagina is disrupted. The normal balance is then replaced by an overgrowth of certain bacteria. There are several different kinds of bacteria that can cause BV. BV is the most common vaginal infection in women of childbearing age. CAUSES   The cause of BV is not fully understood. BV develops when there is an increase or imbalance of harmful bacteria.  Some activities or behaviors can upset the normal balance of bacteria in the vagina and put women at increased risk including:  Having a new sex partner or multiple sex partners.  Douching.  Using an intrauterine device (IUD) for contraception.  It is not clear what role sexual activity plays in the development of BV. However, women that have never had sexual intercourse are rarely infected with BV. Women do not get BV from toilet seats, bedding, swimming pools or from touching objects around them.  SYMPTOMS   Grey vaginal discharge.  A fish-like odor with discharge, especially after sexual intercourse.  Itching or burning of the vagina and vulva.  Burning or pain with urination.  Some women have no signs or symptoms at all. DIAGNOSIS  Your caregiver must examine the vagina for signs of BV. Your caregiver will perform lab tests and look at the sample of vaginal fluid through a microscope. They will look for bacteria and abnormal cells (clue cells), a pH test higher than 4.5, and a positive amine test all associated with BV.  RISKS AND COMPLICATIONS   Pelvic inflammatory disease (PID).  Infections following gynecology surgery.  Developing HIV.  Developing herpes virus. TREATMENT  Sometimes BV will clear up without treatment. However, all women with symptoms of BV should be treated to avoid complications, especially if gynecology surgery is planned. Female partners generally do not need to be treated. However, BV may spread  between female sex partners so treatment is helpful in preventing a recurrence of BV.   BV may be treated with antibiotics. The antibiotics come in either pill or vaginal cream forms. Either can be used with nonpregnant or pregnant women, but the recommended dosages differ. These antibiotics are not harmful to the baby.  BV can recur after treatment. If this happens, a second round of antibiotics will often be prescribed.  Treatment is important for pregnant women. If not treated, BV can cause a premature delivery, especially for a pregnant woman who had a premature birth in the past. All pregnant women who have symptoms of BV should be checked and treated.  For chronic reoccurrence of BV, treatment with a type of prescribed gel vaginally twice a week is helpful. HOME CARE INSTRUCTIONS   Finish all medication as directed by your caregiver.  Do not have sex until treatment is completed.  Tell your sexual partner that you have a vaginal infection. They should see their caregiver and be treated if they have problems, such as a mild rash or itching.  Practice safe sex. Use condoms. Only have 1 sex partner. PREVENTION  Basic prevention steps can help reduce the risk of upsetting the natural balance of bacteria in the vagina and developing BV:  Do not have sexual intercourse (be abstinent).  Do not douche.  Use all of the medicine prescribed for treatment of BV, even if the signs and symptoms go away.  Tell your sex partner if you have BV. That way, they can be treated, if needed, to prevent reoccurrence. SEEK MEDICAL CARE IF:     Your symptoms are not improving after 3 days of treatment.  You have increased discharge, pain, or fever. MAKE SURE YOU:   Understand these instructions.  Will watch your condition.  Will get help right away if you are not doing well or get worse. FOR MORE INFORMATION  Division of STD Prevention (DSTDP), Centers for Disease Control and Prevention:  www.cdc.gov/std American Social Health Association (ASHA): www.ashastd.org  Document Released: 11/27/2005 Document Revised: 02/19/2012 Document Reviewed: 05/20/2009 ExitCare Patient Information 2013 [employer].   Avoid: - excess soap on genital area (consider using plain oatmeal soap) - use of powder or sprays in genital area - douching - wearing underwear to bed (except with menses) - using more than is directed detergent when washing clothes - tight fitting garments around genital area - excess sugar intake   

## 2012-11-04 ENCOUNTER — Other Ambulatory Visit: Payer: Self-pay

## 2012-11-04 ENCOUNTER — Telehealth: Payer: Self-pay

## 2012-11-04 NOTE — Addendum Note (Signed)
Addended by: Lerry Liner D on: 11/04/2012 08:59 AM   Modules accepted: Orders

## 2012-11-04 NOTE — Telephone Encounter (Signed)
Tc to pt per [facility] message. Reviewed medication list in EPIC and pt states,"is correct". Lucy Chris PA-C is with [facility] [phone]. Pt agrees.

## 2012-11-04 NOTE — Telephone Encounter (Signed)
Message copied by Raylene Everts on Mon Nov 04, 2012  8:59 AM ------      Message from: Hal Morales      Created: Sat Nov 02, 2012  1:44 PM       Please call pt to see whether she is taking her blood thinner Gibson Ramp and get complete list of her medications.      Also get information about how to contact primary care provider, Elpidio Anis, PA-C so we can let her know of the patient's upcoming surgery and the need for management of her diabetes, hypertension, asthma and anticoagulation preoperatively and postoperatively.      Thanks,

## 2012-11-11 ENCOUNTER — Ambulatory Visit (HOSPITAL_BASED_OUTPATIENT_CLINIC_OR_DEPARTMENT_OTHER): Payer: Medicare Other | Admitting: Oncology

## 2012-11-11 ENCOUNTER — Telehealth: Payer: Self-pay | Admitting: Oncology

## 2012-11-11 ENCOUNTER — Ambulatory Visit: Payer: Medicare Other | Admitting: Oncology

## 2012-11-11 ENCOUNTER — Encounter: Payer: Self-pay | Admitting: Oncology

## 2012-11-11 VITALS — BP 146/100 | HR 87 | Temp 98.1°F | Resp 20 | Ht 60.0 in | Wt 178.1 lb

## 2012-11-11 DIAGNOSIS — I2699 Other pulmonary embolism without acute cor pulmonale: Secondary | ICD-10-CM

## 2012-11-11 DIAGNOSIS — D6859 Other primary thrombophilia: Secondary | ICD-10-CM

## 2012-11-11 NOTE — Patient Instructions (Addendum)
Stop Xeralto on 12/15/12  We will see you on 1/6 for follow up and give you dose of lovenox on 1/6 and 1/7  You will proceed with surgery on 12/18/12. On the evening of post surgery restart lovenox and continue lovenox until you see me again

## 2012-11-11 NOTE — Progress Notes (Signed)
OFFICE PROGRESS NOTE  CC  Makayla Carina, PA-C [address] [place] [address] Dr. Dierdre Forth  DIAGNOSIS: 41 year old female with hypercoagulability presented with pulmonary embolism last PE was May 18 2012. Patient is currently on Xeralto. She is awaiting a hysterectomy for management of her dysfunctional uterine bleeding.  PRIOR THERAPY:Graph #1 patient has a history of left DVT on pulmonary embolism in 2009 after being on birth control pills. For that she was treated for one year.  #2 in 2010 patient developed a second DVT but she remained off anticoagulation. Due to the dysfunction uterine bleeding patient has been on Miranda IUD since 2011.  #3 in 05/18/2012 patient developed pulmonary embolism again. She was begun on anticoagulation with xeralto. Patient will eventually have a hysterectomy for management of her dysfunctional uterine bleeding.  CURRENT THERAPY: xeralto  INTERVAL HISTORY: Makayla Frazier 41 y.o. female returns for Followup visit today. Overall she is doing well. . She has not had any bleeding she is tolerating her anticoagulant quite nicely. Patient does continue to have some shortness of breath. She also tells me that she is continuing to have certain amount of bleeding vaginally but all in all her periods are stable she still is on the Mirena ring.She has not had any chest pains no palpitations no bleeding problems no easy bruising. Remainder of the 10 point review of systems is negative MEDICAL HISTORY: Past Medical History  Diagnosis Date  . PE (pulmonary embolism)     2009 - on oral contraception; multiple  . Syncopal episodes     unknown etiology  . DM (diabetes mellitus)   . Dysfunctional uterine bleeding     Seen by Dr. Pennie Rushing, GYN; pending hysterectomy as of 07/2012  . Asthma   . OSA (obstructive sleep apnea)   . Major depression     Hx suicide attempt in 2009, [facility] admissions  . Obesity   . Mastodynia   . Hypertension   . Post - coital  bleeding 2012  . Labial lesion 2013  . BV (bacterial vaginosis) 2012  . Oligomenorrhea 2011  . DVT (deep venous thrombosis) 2011    pt had IUD; also 05/2012  . H/O hypercoagulable state     2/2 contraception  . HLD (hyperlipidemia)     ALLERGIES:  is allergic to codeine; darvocet; hydrocodone-acetaminophen; latex; oxycodone-acetaminophen; and percocet.  MEDICATIONS:  Current Outpatient Prescriptions  Medication Sig Dispense Refill  . acetaminophen-codeine (TYLENOL #2) 300-15 MG per tablet Take 1 tablet by mouth every 4 (four) hours as needed.      Marland Kitchen albuterol (PROVENTIL,VENTOLIN) 90 MCG/ACT inhaler 1-2 puffs every 4-6 hours as needed for wheezing or shortness of breath       . insulin aspart (NOVOLOG) 100 UNIT/ML injection Inject 6 Units into the skin 3 (three) times daily as needed. For blood sugar over 200 at meal time      . insulin glargine (LANTUS) 100 UNIT/ML injection Inject 56 Units into the skin at bedtime.      Marland Kitchen lisinopril (PRINIVIL,ZESTRIL) 20 MG tablet Take 20 mg by mouth daily.      . metroNIDAZOLE (METROGEL) 0.75 % vaginal gel Place 1 Applicatorful vaginally 2 (two) times daily.  70 g  0  . rivaroxaban (XARELTO) 10 MG TABS tablet Take 20 mg by mouth daily.        SURGICAL HISTORY:  Past Surgical History  Procedure Date  . Breast reduction surgery     Dr. Kathie Dike [facility] 2011  . Cholecystectomy   .  Cardiac electrophysiology study & dft     Implantable Loop recorder  . Endometrial biopsy 2011    REVIEW OF SYSTEMS:  Pertinent items are noted in HPI.   PHYSICAL EXAMINATION:  Gen.: Well-developed well-nourished female in no acute distress. Lungs: Distant breath sounds Extremities +1 edema Neuro: Nonfocal Skin: No bruises or rashes ECOG PERFORMANCE STATUS: 1 - Symptomatic but completely ambulatory  Blood pressure 146/100, pulse 87, temperature 98.1 F (36.7 C), resp. rate 20, height 5' (1.524 m), weight 178 lb 1.6 oz (80.786 kg), last menstrual period  10/06/2012.  LABORATORY DATA: Lab Results  Component Value Date   WBC 9.4 08/04/2012   HGB 11.1* 08/04/2012   HCT 34.0* 08/04/2012   MCV 78.5 08/04/2012   PLT 283 08/04/2012      Chemistry      Component Value Date/Time   NA 136 08/05/2012 0455   K 3.6 08/05/2012 0455   CL 104 08/05/2012 0455   CO2 23 08/05/2012 0455   BUN 7 08/05/2012 0455   CREATININE 0.59 08/05/2012 0455   CREATININE 0.64 01/26/2011 1030      Component Value Date/Time   CALCIUM 8.9 08/05/2012 0455   ALKPHOS 54 08/03/2012 1428   AST 15 08/03/2012 1428   ALT 19 08/03/2012 1428   BILITOT 0.3 08/03/2012 1428       RADIOGRAPHIC STUDIES:  No results found.  ASSESSMENT: 41 year old female with:  #1 hypercoagulability with PE. Patient had a complete hyppercoag panel performed which was negative. Thus I do believe that her hypercoagulability and her recurrent thrombosis is due to contraception.She is now on xeralto tolerating it well without any evidence of bleeding. Patient is now scheduled for a hysterectomy on 12/18/12  #2 Patient has had several hospitalizations and emergency room visits for syncopal episodes. She is seeing a neurologist who feels that patient most likely has migraines.    PLAN:  #1 patient will continue xeralto until 12/15/12. I will see her back on 12/16/12 and give her a dose of lovenox 24 hours prior to pelvic surgery.   #2. Post surgery patient should begin lovenox 1.5 mg/kg (if the ok by the surgeon). She will continue this for 1 week or until she is seen back in my office for follow up  #3. My eventual plan is to switch her back to xeralto 1 week post op.  #4. I have discussed this with our pharmacist.  I spent 40 minutes counseling the patient face to face. The total time spent in the appointment was 30 minutes.    Drue Second, MD Medical/Oncology [facility] [phone] (beeper) [phone] (Office)

## 2012-11-11 NOTE — Telephone Encounter (Signed)
gve the pt her jan 2014 appt calendar °

## 2012-11-12 ENCOUNTER — Telehealth: Payer: Self-pay | Admitting: Obstetrics and Gynecology

## 2012-11-12 NOTE — Telephone Encounter (Signed)
LAVH possible TAH scheduled for 12/18/12 @ 9:30 with VH/EP.  Patient has MCR; [place] MCD; Authorization given per Joni Reining; NPI [id number]. -Adrianne Pridgen

## 2012-11-13 ENCOUNTER — Other Ambulatory Visit: Payer: Self-pay | Admitting: Obstetrics and Gynecology

## 2012-11-15 ENCOUNTER — Ambulatory Visit: Payer: Medicare Other | Admitting: Oncology

## 2012-11-21 ENCOUNTER — Telehealth: Payer: Self-pay | Admitting: Obstetrics and Gynecology

## 2012-11-21 NOTE — Telephone Encounter (Signed)
Request for Cardiac and Neurological clearance Faxed to the offices of Dr. Hillis Range at [phone]; and Dr. Rosario Jacks at [phone]. Makayla Frazier

## 2012-12-09 ENCOUNTER — Encounter (HOSPITAL_COMMUNITY): Payer: Self-pay | Admitting: Pharmacist

## 2012-12-12 ENCOUNTER — Encounter: Payer: Self-pay | Admitting: Obstetrics and Gynecology

## 2012-12-12 ENCOUNTER — Encounter (HOSPITAL_COMMUNITY)
Admission: RE | Admit: 2012-12-12 | Discharge: 2012-12-12 | Disposition: A | Discharge status: Home / Self Care | Payer: Medicare Other | Source: Ambulatory Visit | Attending: Obstetrics and Gynecology | Admitting: Obstetrics and Gynecology

## 2012-12-12 ENCOUNTER — Encounter (HOSPITAL_COMMUNITY): Payer: Self-pay

## 2012-12-12 ENCOUNTER — Ambulatory Visit (INDEPENDENT_AMBULATORY_CARE_PROVIDER_SITE_OTHER): Payer: Medicare Other | Admitting: Obstetrics and Gynecology

## 2012-12-12 VITALS — BP 110/66 | Temp 99.2°F | Resp 16 | Ht 60.0 in | Wt 180.0 lb

## 2012-12-12 DIAGNOSIS — D219 Benign neoplasm of connective and other soft tissue, unspecified: Secondary | ICD-10-CM

## 2012-12-12 DIAGNOSIS — Z7901 Long term (current) use of anticoagulants: Secondary | ICD-10-CM

## 2012-12-12 DIAGNOSIS — Z01818 Encounter for other preprocedural examination: Secondary | ICD-10-CM

## 2012-12-12 DIAGNOSIS — D259 Leiomyoma of uterus, unspecified: Secondary | ICD-10-CM

## 2012-12-12 DIAGNOSIS — N898 Other specified noninflammatory disorders of vagina: Secondary | ICD-10-CM

## 2012-12-12 HISTORY — DX: Headache: R51

## 2012-12-12 HISTORY — DX: Anemia, unspecified: D64.9

## 2012-12-12 LAB — CBC
MCHC: 33.3 g/dL (ref 30.0–36.0)
Platelets: 282 10*3/uL (ref 150–400)
RDW: 15.1 % (ref 11.5–15.5)
WBC: 9.8 10*3/uL (ref 4.0–10.5)

## 2012-12-12 LAB — POCT WET PREP (WET MOUNT): Whiff Test: NEGATIVE

## 2012-12-12 LAB — BASIC METABOLIC PANEL
Calcium: 9.2 mg/dL (ref 8.4–10.5)
GFR calc Af Amer: >90 mL/min (ref >90)
GFR calc non Af Amer: >90 mL/min (ref >90)
Glucose, Bld: 191 mg/dL — ABNORMAL HIGH (ref 70–99)
Potassium: 3.9 mEq/L (ref 3.5–5.1)
Sodium: 135 mEq/L (ref 135–145)

## 2012-12-12 LAB — SURGICAL PCR SCREEN
MRSA, PCR: NEGATIVE
Staphylococcus aureus: NEGATIVE

## 2012-12-12 NOTE — Patient Instructions (Addendum)
   Your procedure is scheduled on: Wednesday January 8th  Enter through the [employer] [facility] at:9:15am Pick up the phone at the desk and dial [phone] and inform us of your arrival.  Please call this number if you have any problems the morning of surgery: [phone]  Remember: Do not eat anything after midnight on Tuesday Bring your albuterol inhaler day of surgery Please take your blood pressure medicine the evening before surgery as ordered Please take your Lantus the evening before surgery as ordered The morning of surgery-if your blood sugar is 250 or greater-take your novolog 9 Units, if your blood sugar is 200-250 take 1/2 dose. If any question Please call Short Stay and ask morning of surgery at [phone] or you may come in early.   Do not wear jewelry, make-up, or FINGER nail polish No metal in your hair or on your body. Do not wear lotions, powders, perfumes. You may wear deodorant.  Please use your CHG wash as directed prior to surgery.  Do not shave anywhere for at least 12 hours prior to first CHG shower.  Do not bring valuables to the hospital.   Leave suitcase in the car. After Surgery it may be brought to your room. For patients being admitted to the hospital, checkout time is 11:00am the day of discharge.

## 2012-12-12 NOTE — Pre-Procedure Instructions (Addendum)
Spoke with Dr Sherron Ales concerning Patient status--patient has history of syncopal episodes and an implantable loop recorder was placed to r/o dysrythmias. Ruled out. Pt has history of PE in 07/2012 and DVT 2009- on Xarelto-will stop on 1/5 and see hematologist on 1/6 for lovenox in preparation for surgery 1/8. Pt diagnosed with IDDM in 2009-on Lantus 59 Units qhs and Novolog 9 Units 3xd for blood sugars over 200. Pt will continue with lantus evening prior to surgery. Pt states fasting blood sugar generally runs 220mg /dl. If fasting blood sugar 250- take novolog 9 units, if 200-250 take 1/2 dose. Pt has hypertension on lisinopril-will take evening prior to surgery as ordered. Pt will bring albuterol inhaler-uses approx 4x month. Pt verbalized understanding

## 2012-12-12 NOTE — Progress Notes (Signed)
Makayla Frazier is a 42 y.o. female G0P0 who presents for hysterectomy because of menorrhagia for over a year. Patient's bleeding pattern is characterized by a seven day flow with clots that require her to change her pad hourly. She will also have mid-cycle spotting of 3 day duration almost monthly.  With any vaginal  bleeding,  Makayla Frazier will have cramping that is rated as a 10/10 on a 10 point pain scale and is able to find relief only with Tramadol (decreasing discomfort to 3/10).  She denies vaginitis symptoms, urinary tract or bowel symptoms and was given a Mirena IUD in 2011 to help with her flow to no avail.  A TSH in August was within normal limits and so was a CBC however, the red blood cell morphology showed hypochromic/microcytic cells. An endometrial biopsy in July 2013 revealed benign finding with  no hyperplasia, atypia or malignancy. Patient has a history of  two left lower extremity DVTs in 2009 & 2010.  In June of 2013 she developed a pulmonary embolism and is on Xarelto for anticoagulation.    Pelvic ultrasound 08/2010 showed a uterus: 8.15 x 4.40 x 3.60 cm with #2 hyperechoic masses 0.91 cm and 0.81 cm with single color doppler blood flow; both ovaries appeared normal on that study. The patient subsequently underwent a hysteroscopy with dilatation and curettage for those endometrial findings.Given that she is unable to tolerate hormonal management of her vaginal bleeding, she has decided to proceed with definitive therapy in the form of hysterectomy.   Past Medical History  OB History: G0P0   GYN History: menarche: 42 YO;   LMP: 11/06/12; Contracepiton: abstinence;  IUD (Mirena placed 2011 The patient reports a past history of: HPV. and HSV-2  Abnormal PAP smear at age 23 treated with cryotherapy and normal since;  Last PAP smear: July 2013  Medical History:  Pulmonary Embolism (June 2013) , left DVT (2009 & 2010), hypertension, asthma, syncopal episodes believed to be related to  migraines, migraines, diabetes mellitus,  hyperlipidemia, major depression (suicide attempt 2009) and obstructive sleep apnea, simple hyperplasia without atypia (2011), vitamin D deficiency, hypothyroidism, anemia, chest wall pain, and neuropathy  Surgical History:  1999 Cholecystectomy;  2010 Implantable Loop Cardiac Recorder;  2011 Breast Reduction;  2002 & 2011 Hysteroscopy with D & C Denies problems with anesthesia or history of blood transfusions  Family History:  hypercoagulopathy, hypertension, diabetes mellitus, bone cancer and asthma  Social History:   Single and disabled;  Denies alcohol, tobacco or illicit drug use    Outpatient Encounter Prescriptions as of 12/12/2012  Medication Sig Dispense Refill  . albuterol (PROVENTIL,VENTOLIN) 90 MCG/ACT inhaler 1-2 puffs every 4-6 hours as needed for wheezing or shortness of breath       . insulin aspart (NOVOLOG) 100 UNIT/ML injection Inject 9 Units into the skin 3 (three) times daily as needed. For blood sugar over 200 at meal time      . insulin glargine (LANTUS) 100 UNIT/ML injection Inject 56 Units into the skin at bedtime.      Marland Kitchen lisinopril (PRINIVIL,ZESTRIL) 20 MG tablet Take 20 mg by mouth daily.      . rivaroxaban (XARELTO) 10 MG TABS tablet Take 20 mg by mouth daily.      Marland Kitchen acetaminophen-codeine (TYLENOL #2) 300-15 MG per tablet Take 1 tablet by mouth every 4 (four) hours as needed.        Allergies  Allergen Reactions  . Codeine Nausea And Vomiting       .  Darvocet (Propoxyphene-Acetaminophen)     vomiting  . Hydrocodone-Acetaminophen     Vomiting   . Latex     rash  . Oxycodone-Acetaminophen     vomiting  . Percocet (Oxycodone-Acetaminophen)     vomiting    Denies sensitivity to peanuts, shellfish or soy products.  Adhesives cause a rash  ROS: Admits to glasses, occasional chest pain and shortness of breath (evaluated by Dr. Johney Frame), syncopal episodes, occasional migraines,  Denies vision changes, nasal congestion,  dysphagia, tinnitus, hoarseness, cough,  nausea, vomiting, diarrhea,constipation,  urinary frequency, urgency  dysuria, hematuria, vaginitis symptoms, swelling of joints,easy bruising,  myalgias, arthralgias, skin rashes, unexplained weight loss and except as is mentioned in the history of present illness, patient's review of systems is otherwise negative.     Physical Exam    BP 110/66  Resp 16  Ht 5' (1.524 m)  Wt 180 lb (81.647 kg)  BMI 35.15 kg/m2  LMP 11/06/2012  Neck: supple without masses or thyromegaly Lungs: clear to auscultation Heart: regular rate and rhythm Abdomen: soft, non-tender and no organomegaly Pelvic:EGBUS- wnl; vagina-normal rugae; uterus-normal size, cervix without lesions or motion tenderness; adnexae-no tenderness or masses Extremities:  no clubbing, cyanosis or edema   Assesment:  Menorrhagia                       Dysmenorrhea                       S/P Pulmonary Embolism                       Anticoagulation Therapy   Disposition:  A discussion was held with patient regarding the indication for her procedure(s) along with the risks, which include but are not limited to: reaction to anesthesia, damage to adjacent organs, infection, excessive bleeding and the possible need for an open abdominal incision.  The patient verbalized understanding of these risks and has consented to proceed with a Laparoscopically Assisted Vaginal Hysterectomy with a possible Total Abdominal Hysterectomy at [facility] of [place] on December 18, 2012 at 9:30 a.m.  CSN# [id number]   Tahir Blank J. Lowell Guitar, PA-C  for Dr. Maris Berger. [place]

## 2012-12-13 ENCOUNTER — Encounter: Payer: Self-pay | Admitting: Obstetrics and Gynecology

## 2012-12-16 ENCOUNTER — Ambulatory Visit (HOSPITAL_BASED_OUTPATIENT_CLINIC_OR_DEPARTMENT_OTHER): Payer: Medicare Other | Admitting: Adult Health

## 2012-12-16 ENCOUNTER — Telehealth: Payer: Self-pay | Admitting: Oncology

## 2012-12-16 ENCOUNTER — Encounter: Payer: Medicare Other | Admitting: Obstetrics and Gynecology

## 2012-12-16 ENCOUNTER — Encounter: Payer: Self-pay | Admitting: Adult Health

## 2012-12-16 ENCOUNTER — Other Ambulatory Visit (HOSPITAL_BASED_OUTPATIENT_CLINIC_OR_DEPARTMENT_OTHER): Payer: Medicare Other | Admitting: Lab

## 2012-12-16 VITALS — BP 195/120 | HR 98 | Temp 98.5°F | Resp 20 | Ht 60.0 in | Wt 179.1 lb

## 2012-12-16 DIAGNOSIS — D6859 Other primary thrombophilia: Secondary | ICD-10-CM

## 2012-12-16 DIAGNOSIS — I2699 Other pulmonary embolism without acute cor pulmonale: Secondary | ICD-10-CM

## 2012-12-16 DIAGNOSIS — Z7901 Long term (current) use of anticoagulants: Secondary | ICD-10-CM

## 2012-12-16 LAB — CBC WITH DIFFERENTIAL/PLATELET
BASO%: 0.4 % (ref 0.0–2.0)
Basophils Absolute: 0 10*3/uL (ref 0.0–0.1)
HCT: 40.2 % (ref 34.8–46.6)
HGB: 13 g/dL (ref 11.6–15.9)
MCHC: 32.2 g/dL (ref 31.5–36.0)
MONO#: 0.5 10*3/uL (ref 0.1–0.9)
NEUT%: 57.3 % (ref 38.4–76.8)
WBC: 9.9 10*3/uL (ref 3.9–10.3)
lymph#: 3.6 10*3/uL — ABNORMAL HIGH (ref 0.9–3.3)

## 2012-12-16 MED ORDER — ENOXAPARIN SODIUM 120 MG/0.8ML ~~LOC~~ SOLN
1.5000 mg/kg | Freq: Every day | SUBCUTANEOUS | Status: DC
Start: 2012-12-16 — End: 2012-12-16

## 2012-12-16 MED ORDER — ENOXAPARIN SODIUM 120 MG/0.8ML ~~LOC~~ SOLN: 1.5000 mg/kg | Freq: Every day | SUBCUTANEOUS | Status: DC

## 2012-12-16 NOTE — Telephone Encounter (Signed)
gv pt appt schedule for January. °

## 2012-12-16 NOTE — Patient Instructions (Signed)
Doing well.  Please inject Lovenox 120mg  subcutaneously daily today, and tomorrow morning.  Do not take any more Xarelto until after your next appt with [place].  After surgery, please restart the Lovenox 120mg  subcutaneously daily until your appt with [place].

## 2012-12-16 NOTE — Progress Notes (Signed)
OFFICE PROGRESS NOTE  CC  Windy Carina, PA-C [address] [place] [address] Dr. Dierdre Forth  DIAGNOSIS: 42 year old female with hypercoagulability presented with pulmonary embolism last PE was May 18 2012. Patient is currently on Xeralto. She is awaiting a hysterectomy for management of her dysfunctional uterine bleeding.  PRIOR THERAPY:Graph #1 patient has a history of left DVT on pulmonary embolism in 2009 after being on birth control pills. For that she was treated for one year.  #2 in 2010 patient developed a second DVT but she remained off anticoagulation. Due to the dysfunction uterine bleeding patient has been on Miranda IUD since 2011.  #3 in 05/18/2012 patient developed pulmonary embolism again. She was begun on anticoagulation with xeralto. Patient will eventually have a hysterectomy for management of her dysfunctional uterine bleeding.  CURRENT THERAPY: xeralto  INTERVAL HISTORY: Makayla Frazier 42 y.o. female returns for Followup visit today.  She has her pelvic surgery scheduled on 12/18/12.  She is here for planning of the transition of her Xarelto to Lovenox.  She's doing well.  She hasn't taken her Xarelto since yesterday as instructed.  She is w/o fevers, chills, chest pain, swelling, easy bruising, easy bleeding, or any other concerns.    MEDICAL HISTORY: Past Medical History  Diagnosis Date  . PE (pulmonary embolism)     2009 - on oral contraception; multiple  . Syncopal episodes     unknown etiology  . Dysfunctional uterine bleeding     Seen by Dr. Pennie Rushing, GYN; pending hysterectomy as of 07/2012  . Asthma   . Obesity   . Mastodynia   . Hypertension   . Post - coital bleeding 2012  . Labial lesion 2013  . BV (bacterial vaginosis) 2012  . Oligomenorrhea 2011  . DVT (deep venous thrombosis) 2011    pt had IUD; also 05/2012  . H/O hypercoagulable state     2/2 contraception  . HLD (hyperlipidemia)   . DM (diabetes mellitus)     diagnosed  2009  . Headache   . Anemia   . Herpes simplex without mention of complication 06/2011    HSV-2  . Thyroid disease     hypothyroidism  . Vitamin D deficiency   . Major depression     Hx suicide attempt in 2009, [facility] admissions  . Venous insufficiency   . Sleep apnea   . Neuropathy     ALLERGIES:  is allergic to codeine; darvocet; hydrocodone-acetaminophen; latex; oxycodone-acetaminophen; and percocet.  MEDICATIONS:  Current Outpatient Prescriptions  Medication Sig Dispense Refill  . acetaminophen-codeine (TYLENOL #2) 300-15 MG per tablet Take 1 tablet by mouth every 4 (four) hours as needed.      Marland Kitchen albuterol (PROVENTIL,VENTOLIN) 90 MCG/ACT inhaler 1-2 puffs every 4-6 hours as needed for wheezing or shortness of breath       . enoxaparin (LOVENOX) 120 MG/0.8ML injection Inject 0.81 mLs (120 mg total) into the skin daily.  12 Syringe  0  . insulin aspart (NOVOLOG) 100 UNIT/ML injection Inject 9 Units into the skin 3 (three) times daily as needed. For blood sugar over 200 at meal time      . insulin glargine (LANTUS) 100 UNIT/ML injection Inject 56 Units into the skin at bedtime.      Marland Kitchen lisinopril (PRINIVIL,ZESTRIL) 20 MG tablet Take 20 mg by mouth daily.      . metroNIDAZOLE (METROGEL) 0.75 % vaginal gel       . rivaroxaban (XARELTO) 10 MG TABS tablet  Take 20 mg by mouth daily.      Marland Kitchen topiramate (TOPAMAX) 25 MG tablet         SURGICAL HISTORY:  Past Surgical History  Procedure Date  . Breast reduction surgery     Dr. Kathie Dike [facility] 2011  . Cholecystectomy   . Cardiac electrophysiology study & dft     Implantable Loop recorder  . Endometrial biopsy 2011  . Dilatation & currettage/hysteroscopy with resectocope 2007 & 2013    REVIEW OF SYSTEMS:   General: fatigue (-), night sweats (-), fever (-), pain (-) Lymph: palpable nodes (-) HEENT: vision changes (-), mucositis (-), gum bleeding (-), epistaxis (-) Cardiovascular: chest pain (-), palpitations (-) Pulmonary: shortness of  breath (-), dyspnea on exertion (-), cough (-), hemoptysis (-) GI:  Early satiety (-), melena (-), dysphagia (-), nausea/vomiting (-), diarrhea (-) GU: dysuria (-), hematuria (-), incontinence (-) Musculoskeletal: joint swelling (-), joint pain (-), back pain (-) Neuro: weakness (-), numbness (-), headache (-), confusion (-) Skin: Rash (-), lesions (-), dryness (-) Psych: depression (-), suicidal/homicidal ideation (-), feeling of hopelessness (-)   PHYSICAL EXAMINATION:  General: Patient is a well appearing female in no acute distress HEENT: PERRLA, sclerae anicteric no conjunctival pallor, MMM Neck: supple, no palpable adenopathy Lungs: clear to auscultation bilaterally, no wheezes, rhonchi, or rales Cardiovascular: regular rate rhythm, S1, S2, no murmurs, rubs or gallops Abdomen: Soft, non-tender, non-distended, normoactive bowel sounds, no HSM Extremities: warm and well perfused, no clubbing, cyanosis, or edema Skin: No rashes or lesions Neuro: Non-focal ECOG PERFORMANCE STATUS: 1 - Symptomatic but completely ambulatory  Blood pressure 195/120, pulse 98, temperature 98.5 F (36.9 C), resp. rate 20, height 5' (1.524 m), weight 179 lb 1.6 oz (81.239 kg), last menstrual period 11/06/2012.  LABORATORY DATA: Lab Results  Component Value Date   WBC 9.9 12/16/2012   HGB 13.0 12/16/2012   HCT 40.2 12/16/2012   MCV 79.4* 12/16/2012   PLT 306 12/16/2012      Chemistry      Component Value Date/Time   NA 135 12/12/2012 1001   K 3.9 12/12/2012 1001   CL 99 12/12/2012 1001   CO2 26 12/12/2012 1001   BUN 9 12/12/2012 1001   CREATININE 0.69 12/12/2012 1001   CREATININE 0.64 01/26/2011 1030      Component Value Date/Time   CALCIUM 9.2 12/12/2012 1001   ALKPHOS 54 08/03/2012 1428   AST 15 08/03/2012 1428   ALT 19 08/03/2012 1428   BILITOT 0.3 08/03/2012 1428       RADIOGRAPHIC STUDIES:  No results found.  ASSESSMENT: 42 year old female with:  #1 hypercoagulability with PE. Patient had a complete  hyppercoag panel performed which was negative. Thus I do believe that her hypercoagulability and her recurrent thrombosis is due to contraception.She is now on xeralto tolerating it well without any evidence of bleeding. Patient is now scheduled for a hysterectomy on 12/18/12  #2 Patient has had several hospitalizations and emergency room visits for syncopal episodes. She is seeing a neurologist who feels that patient most likely has migraines.    PLAN:  #1 Makayla Frazier will receive 120mg  of Lovenox today, and tomorrow.  She will then hold her therapy.  After surgery she will restart her Lovenox and I have provided her with enough syringes in her prescription to cover this amount of time.    #2. She will continue on Lovenox and we will see her back on 1/16 to restart her Xeralto.    I  spent 25 minutes counseling the patient face to face. The total time spent in the appointment was 30 minutes.   Cherie Ouch Lyn Hollingshead, NP Medical Oncology [facility] Phone: [phone]

## 2012-12-17 MED ORDER — DEXTROSE 5 % IV SOLN
2.0000 g | INTRAVENOUS | Status: AC
  Administered 2012-12-18: 2 g via INTRAVENOUS
  Filled 2012-12-17: qty 2

## 2012-12-18 ENCOUNTER — Encounter (HOSPITAL_COMMUNITY): Payer: Self-pay | Admitting: *Deleted

## 2012-12-18 ENCOUNTER — Ambulatory Visit (HOSPITAL_COMMUNITY)
Admission: RE | Admit: 2012-12-18 | Discharge: 2012-12-19 | Disposition: A | Discharge status: Home / Self Care | Payer: Medicare Other | Source: Ambulatory Visit | Attending: Obstetrics and Gynecology | Admitting: Obstetrics and Gynecology

## 2012-12-18 ENCOUNTER — Encounter (HOSPITAL_COMMUNITY)
Admission: RE | Disposition: A | Discharge status: Home / Self Care | Payer: Self-pay | Source: Ambulatory Visit | Attending: Obstetrics and Gynecology

## 2012-12-18 ENCOUNTER — Inpatient Hospital Stay (HOSPITAL_COMMUNITY): Payer: Medicare Other | Admitting: Anesthesiology

## 2012-12-18 ENCOUNTER — Encounter (HOSPITAL_COMMUNITY): Payer: Self-pay | Admitting: Anesthesiology

## 2012-12-18 DIAGNOSIS — M25562 Pain in left knee: Secondary | ICD-10-CM

## 2012-12-18 DIAGNOSIS — N8500 Endometrial hyperplasia, unspecified: Secondary | ICD-10-CM | POA: Insufficient documentation

## 2012-12-18 DIAGNOSIS — Z86711 Personal history of pulmonary embolism: Secondary | ICD-10-CM | POA: Insufficient documentation

## 2012-12-18 DIAGNOSIS — IMO0001 Reserved for inherently not codable concepts without codable children: Secondary | ICD-10-CM

## 2012-12-18 DIAGNOSIS — N920 Excessive and frequent menstruation with regular cycle: Secondary | ICD-10-CM

## 2012-12-18 DIAGNOSIS — I2699 Other pulmonary embolism without acute cor pulmonale: Secondary | ICD-10-CM

## 2012-12-18 DIAGNOSIS — Z30432 Encounter for removal of intrauterine contraceptive device: Secondary | ICD-10-CM | POA: Insufficient documentation

## 2012-12-18 DIAGNOSIS — D250 Submucous leiomyoma of uterus: Secondary | ICD-10-CM | POA: Insufficient documentation

## 2012-12-18 HISTORY — PX: LAPAROSCOPIC ASSISTED VAGINAL HYSTERECTOMY: SHX5398

## 2012-12-18 HISTORY — PX: BILATERAL SALPINGECTOMY: SHX5743

## 2012-12-18 HISTORY — PX: IUD REMOVAL: SHX5392

## 2012-12-18 LAB — CBC
MCH: 25.5 pg — ABNORMAL LOW (ref 26.0–34.0)
MCV: 78.1 fL (ref 78.0–100.0)
Platelets: 324 10*3/uL (ref 150–400)
RDW: 15.1 % (ref 11.5–15.5)
WBC: 21.4 10*3/uL — ABNORMAL HIGH (ref 4.0–10.5)

## 2012-12-18 LAB — ABO/RH: ABO/RH(D): O POS

## 2012-12-18 LAB — GLUCOSE, CAPILLARY

## 2012-12-18 LAB — GLUCOSE, RANDOM: Glucose, Bld: 122 mg/dL — ABNORMAL HIGH (ref 70–99)

## 2012-12-18 LAB — TYPE AND SCREEN: ABO/RH(D): O POS

## 2012-12-18 LAB — CREATININE, SERUM: GFR calc Af Amer: >90 mL/min (ref >90)

## 2012-12-18 SURGERY — HYSTERECTOMY, VAGINAL, LAPAROSCOPY-ASSISTED
Anesthesia: General | Site: Vagina | Wound class: Clean Contaminated

## 2012-12-18 MED ORDER — GLYCOPYRROLATE 0.2 MG/ML IJ SOLN
INTRAMUSCULAR | Status: DC | PRN
  Administered 2012-12-18: 0.4 mg via INTRAVENOUS

## 2012-12-18 MED ORDER — FENTANYL CITRATE 0.05 MG/ML IJ SOLN
INTRAMUSCULAR | Status: AC
  Filled 2012-12-18: qty 5

## 2012-12-18 MED ORDER — LIDOCAINE HCL (CARDIAC) 20 MG/ML IV SOLN
INTRAVENOUS | Status: DC | PRN
  Administered 2012-12-18: 60 mg via INTRAVENOUS

## 2012-12-18 MED ORDER — 0.9 % SODIUM CHLORIDE (POUR BTL) OPTIME
TOPICAL | Status: DC | PRN
  Administered 2012-12-18 (×2): 1000 mL

## 2012-12-18 MED ORDER — SODIUM CHLORIDE 0.9 % IJ SOLN: 9.0000 mL | INTRAMUSCULAR | Status: DC | PRN

## 2012-12-18 MED ORDER — NEOSTIGMINE METHYLSULFATE 1 MG/ML IJ SOLN
INTRAMUSCULAR | Status: AC
  Filled 2012-12-18: qty 1

## 2012-12-18 MED ORDER — LISINOPRIL 20 MG PO TABS
20.0000 mg | ORAL_TABLET | Freq: Every day | ORAL | Status: DC
  Administered 2012-12-18 – 2012-12-19 (×2): 20 mg via ORAL
  Filled 2012-12-18 (×3): qty 1

## 2012-12-18 MED ORDER — DEXAMETHASONE SODIUM PHOSPHATE 10 MG/ML IJ SOLN
INTRAMUSCULAR | Status: AC
  Filled 2012-12-18: qty 1

## 2012-12-18 MED ORDER — ENOXAPARIN SODIUM 120 MG/0.8ML ~~LOC~~ SOLN
120.0000 mg | SUBCUTANEOUS | Status: DC
  Filled 2012-12-18: qty 0.8

## 2012-12-18 MED ORDER — NEOSTIGMINE METHYLSULFATE 1 MG/ML IJ SOLN
INTRAMUSCULAR | Status: DC | PRN
  Administered 2012-12-18: 2 mg via INTRAVENOUS

## 2012-12-18 MED ORDER — INDIGOTINDISULFONATE SODIUM 8 MG/ML IJ SOLN
INTRAMUSCULAR | Status: AC
  Filled 2012-12-18: qty 5

## 2012-12-18 MED ORDER — NALOXONE HCL 0.4 MG/ML IJ SOLN: 0.4000 mg | INTRAMUSCULAR | Status: DC | PRN

## 2012-12-18 MED ORDER — PROPOFOL 10 MG/ML IV EMUL
INTRAVENOUS | Status: AC
  Filled 2012-12-18: qty 20

## 2012-12-18 MED ORDER — ROCURONIUM BROMIDE 50 MG/5ML IV SOLN
INTRAVENOUS | Status: AC
  Filled 2012-12-18: qty 1

## 2012-12-18 MED ORDER — KETOROLAC TROMETHAMINE 30 MG/ML IJ SOLN
15.0000 mg | Freq: Once | INTRAMUSCULAR | Status: AC | PRN
  Administered 2012-12-18: 30 mg via INTRAVENOUS

## 2012-12-18 MED ORDER — HYDROMORPHONE 0.3 MG/ML IV SOLN
INTRAVENOUS | Status: DC
  Filled 2012-12-18: qty 25

## 2012-12-18 MED ORDER — PROPOFOL 10 MG/ML IV BOLUS
INTRAVENOUS | Status: DC | PRN
  Administered 2012-12-18: 150 mg via INTRAVENOUS
  Administered 2012-12-18: 50 mg via INTRAVENOUS

## 2012-12-18 MED ORDER — ONDANSETRON HCL 4 MG/2ML IJ SOLN: 4.0000 mg | Freq: Four times a day (QID) | INTRAMUSCULAR | Status: DC | PRN

## 2012-12-18 MED ORDER — BUPIVACAINE HCL (PF) 0.25 % IJ SOLN
INTRAMUSCULAR | Status: DC | PRN
  Administered 2012-12-18: 10 mL

## 2012-12-18 MED ORDER — KETOROLAC TROMETHAMINE 30 MG/ML IJ SOLN
INTRAMUSCULAR | Status: AC
  Administered 2012-12-18: 30 mg via INTRAVENOUS
  Filled 2012-12-18: qty 1

## 2012-12-18 MED ORDER — BUPIVACAINE HCL (PF) 0.25 % IJ SOLN
INTRAMUSCULAR | Status: AC
  Filled 2012-12-18: qty 30

## 2012-12-18 MED ORDER — ALBUTEROL SULFATE HFA 108 (90 BASE) MCG/ACT IN AERS: 2.0000 | INHALATION_SPRAY | Freq: Four times a day (QID) | RESPIRATORY_TRACT | Status: DC | PRN

## 2012-12-18 MED ORDER — ONDANSETRON HCL 4 MG/2ML IJ SOLN
INTRAMUSCULAR | Status: DC | PRN
  Administered 2012-12-18: 4 mg via INTRAVENOUS

## 2012-12-18 MED ORDER — LACTATED RINGERS IV SOLN
INTRAVENOUS | Status: DC
  Administered 2012-12-18 – 2012-12-19 (×2): via INTRAVENOUS

## 2012-12-18 MED ORDER — DIPHENHYDRAMINE HCL 50 MG/ML IJ SOLN: 12.5000 mg | Freq: Four times a day (QID) | INTRAMUSCULAR | Status: DC | PRN

## 2012-12-18 MED ORDER — ESTRADIOL 0.1 MG/GM VA CREA
TOPICAL_CREAM | VAGINAL | Status: AC
  Filled 2012-12-18: qty 42.5

## 2012-12-18 MED ORDER — FENTANYL CITRATE 0.05 MG/ML IJ SOLN
INTRAMUSCULAR | Status: DC | PRN
  Administered 2012-12-18: 100 ug via INTRAVENOUS
  Administered 2012-12-18: 50 ug via INTRAVENOUS
  Administered 2012-12-18: 100 ug via INTRAVENOUS
  Administered 2012-12-18: 150 ug via INTRAVENOUS
  Administered 2012-12-18 (×2): 100 ug via INTRAVENOUS
  Administered 2012-12-18: 150 ug via INTRAVENOUS

## 2012-12-18 MED ORDER — TRAMADOL HCL 50 MG PO TABS
100.0000 mg | ORAL_TABLET | Freq: Four times a day (QID) | ORAL | Status: DC | PRN
  Administered 2012-12-19: 100 mg via ORAL
  Filled 2012-12-18: qty 2

## 2012-12-18 MED ORDER — HYDROMORPHONE HCL PF 1 MG/ML IJ SOLN
0.2500 mg | INTRAMUSCULAR | Status: DC | PRN
  Administered 2012-12-18 (×2): 0.5 mg via INTRAVENOUS

## 2012-12-18 MED ORDER — DIPHENHYDRAMINE HCL 12.5 MG/5ML PO ELIX
12.5000 mg | ORAL_SOLUTION | Freq: Four times a day (QID) | ORAL | Status: DC | PRN
Start: 2012-12-18 — End: 2012-12-18

## 2012-12-18 MED ORDER — ROCURONIUM BROMIDE 100 MG/10ML IV SOLN
INTRAVENOUS | Status: DC | PRN
  Administered 2012-12-18 (×4): 10 mg via INTRAVENOUS
  Administered 2012-12-18: 50 mg via INTRAVENOUS

## 2012-12-18 MED ORDER — HYDROMORPHONE 0.3 MG/ML IV SOLN: INTRAVENOUS | Status: DC

## 2012-12-18 MED ORDER — VASOPRESSIN 20 UNIT/ML IJ SOLN
INTRAVENOUS | Status: DC | PRN
  Administered 2012-12-18: 13:00:00 via INTRAMUSCULAR

## 2012-12-18 MED ORDER — INSULIN GLARGINE 100 UNIT/ML ~~LOC~~ SOLN
59.0000 [IU] | Freq: Every day | SUBCUTANEOUS | Status: DC
  Administered 2012-12-18: 59 [IU] via SUBCUTANEOUS
  Filled 2012-12-18: qty 0.59

## 2012-12-18 MED ORDER — ONDANSETRON HCL 4 MG PO TABS: 4.0000 mg | ORAL_TABLET | Freq: Three times a day (TID) | ORAL | Status: DC | PRN

## 2012-12-18 MED ORDER — DIPHENHYDRAMINE HCL 12.5 MG/5ML PO ELIX: 12.5000 mg | ORAL_SOLUTION | Freq: Four times a day (QID) | ORAL | Status: DC | PRN

## 2012-12-18 MED ORDER — GLYCOPYRROLATE 0.2 MG/ML IJ SOLN
INTRAMUSCULAR | Status: AC
  Filled 2012-12-18: qty 2

## 2012-12-18 MED ORDER — INSULIN ASPART 100 UNIT/ML ~~LOC~~ SOLN
0.0000 [IU] | Freq: Three times a day (TID) | SUBCUTANEOUS | Status: DC
  Administered 2012-12-19: 08:00:00 via SUBCUTANEOUS

## 2012-12-18 MED ORDER — VASOPRESSIN 20 UNIT/ML IJ SOLN
INTRAMUSCULAR | Status: AC
  Filled 2012-12-18: qty 1

## 2012-12-18 MED ORDER — LIDOCAINE HCL (CARDIAC) 20 MG/ML IV SOLN
INTRAVENOUS | Status: AC
  Filled 2012-12-18: qty 5

## 2012-12-18 MED ORDER — KETOROLAC TROMETHAMINE 30 MG/ML IJ SOLN
30.0000 mg | Freq: Four times a day (QID) | INTRAMUSCULAR | Status: AC
  Administered 2012-12-18 – 2012-12-19 (×3): 30 mg via INTRAVENOUS
  Filled 2012-12-18 (×3): qty 1

## 2012-12-18 MED ORDER — MENTHOL 3 MG MT LOZG: 1.0000 | LOZENGE | OROMUCOSAL | Status: DC | PRN

## 2012-12-18 MED ORDER — DEXAMETHASONE SODIUM PHOSPHATE 10 MG/ML IJ SOLN
INTRAMUSCULAR | Status: DC | PRN
  Administered 2012-12-18: 10 mg via INTRAVENOUS

## 2012-12-18 MED ORDER — HYDROMORPHONE HCL PF 1 MG/ML IJ SOLN
INTRAMUSCULAR | Status: AC
  Administered 2012-12-18: 0.5 mg via INTRAVENOUS
  Filled 2012-12-18: qty 1

## 2012-12-18 MED ORDER — LACTATED RINGERS IV SOLN
INTRAVENOUS | Status: DC
  Administered 2012-12-18 (×4): via INTRAVENOUS

## 2012-12-18 MED ORDER — ONDANSETRON HCL 4 MG/2ML IJ SOLN
INTRAMUSCULAR | Status: AC
  Filled 2012-12-18: qty 2

## 2012-12-18 MED ORDER — MIDAZOLAM HCL 5 MG/5ML IJ SOLN
INTRAMUSCULAR | Status: DC | PRN
  Administered 2012-12-18: 2 mg via INTRAVENOUS

## 2012-12-18 MED ORDER — MIDAZOLAM HCL 2 MG/2ML IJ SOLN
INTRAMUSCULAR | Status: AC
  Filled 2012-12-18: qty 2

## 2012-12-18 MED ORDER — HYDROMORPHONE 0.3 MG/ML IV SOLN
INTRAVENOUS | Status: DC
  Administered 2012-12-18: 1.79 mg via INTRAVENOUS
  Administered 2012-12-18: 18:00:00 via INTRAVENOUS
  Administered 2012-12-19 (×2): 0.2 mg via INTRAVENOUS
  Administered 2012-12-19: 0.599 mg via INTRAVENOUS

## 2012-12-18 MED ORDER — INSULIN ASPART 100 UNIT/ML ~~LOC~~ SOLN
9.0000 [IU] | Freq: Once | SUBCUTANEOUS | Status: AC
  Administered 2012-12-18: 9 [IU] via SUBCUTANEOUS

## 2012-12-18 MED ORDER — METOPROLOL TARTRATE 1 MG/ML IV SOLN
5.0000 mg | INTRAVENOUS | Status: AC | PRN
  Administered 2012-12-18 (×5): 1 mg via INTRAVENOUS
  Filled 2012-12-18: qty 5

## 2012-12-18 SURGICAL SUPPLY — 77 items
BLADE HEX COATED 2.75 (ELECTRODE) IMPLANT
CABLE HIGH FREQUENCY MONO STRZ (ELECTRODE) ×4 IMPLANT
CANISTER SUCTION 2500CC (MISCELLANEOUS) ×4 IMPLANT
CATH ROBINSON RED A/P 16FR (CATHETERS) IMPLANT
CHLORAPREP W/TINT 26ML (MISCELLANEOUS) ×4 IMPLANT
CLOTH BEACON ORANGE TIMEOUT ST (SAFETY) ×4 IMPLANT
CONT PATH 16OZ SNAP LID 3702 (MISCELLANEOUS) ×4 IMPLANT
CONT SPECI 4OZ STER CLIK (MISCELLANEOUS) IMPLANT
CONTAINER PREFILL 10% NBF 60ML (FORM) ×8 IMPLANT
COVER TABLE BACK 60X90 (DRAPES) ×4 IMPLANT
DECANTER SPIKE VIAL GLASS SM (MISCELLANEOUS) ×4 IMPLANT
DERMABOND ADVANCED (GAUZE/BANDAGES/DRESSINGS)
DERMABOND ADVANCED .7 DNX12 (GAUZE/BANDAGES/DRESSINGS) IMPLANT
DRAIN JACKSON PRT FLT 7MM (DRAIN) IMPLANT
DRAPE PROXIMA HALF (DRAPES) ×4 IMPLANT
ELECT NEEDLE TIP 2.8 STRL (NEEDLE) IMPLANT
ELECT REM PT RETURN 9FT ADLT (ELECTROSURGICAL) ×4
ELECTRODE REM PT RTRN 9FT ADLT (ELECTROSURGICAL) ×3 IMPLANT
EVACUATOR SILICONE 100CC (DRAIN) IMPLANT
EVACUATOR SMOKE 8.L (FILTER) ×4 IMPLANT
FORCEPS CUTTING 33CM 5MM (CUTTING FORCEPS) IMPLANT
FORCEPS CUTTING 45CM 5MM (CUTTING FORCEPS) IMPLANT
GAUZE PACKING 1 X5 YD ST (GAUZE/BANDAGES/DRESSINGS) ×4 IMPLANT
GAUZE PACKING 2X5 YD STERILE (GAUZE/BANDAGES/DRESSINGS) IMPLANT
GAUZE SPONGE 4X4 16PLY XRAY LF (GAUZE/BANDAGES/DRESSINGS) IMPLANT
GAUZE VASELINE 3X9 (GAUZE/BANDAGES/DRESSINGS) ×4 IMPLANT
GLOVE BIOGEL PI IND STRL 6.5 (GLOVE) ×9 IMPLANT
GLOVE BIOGEL PI INDICATOR 6.5 (GLOVE) ×3
GLOVE SURG SS PI 6.5 STRL IVOR (GLOVE) ×12 IMPLANT
GOWN PREVENTION PLUS LG XLONG (DISPOSABLE) ×28 IMPLANT
GOWN STRL REIN XL XLG (GOWN DISPOSABLE) ×8 IMPLANT
NEEDLE HYPO 25X1 1.5 SAFETY (NEEDLE) IMPLANT
NEEDLE MAYO .5 CIRCLE (NEEDLE) ×4 IMPLANT
NEEDLE SPNL 22GX3.5 QUINCKE BK (NEEDLE) IMPLANT
NS IRRIG 1000ML POUR BTL (IV SOLUTION) ×12 IMPLANT
PACK ABDOMINAL GYN (CUSTOM PROCEDURE TRAY) IMPLANT
PACK LAVH (CUSTOM PROCEDURE TRAY) ×4 IMPLANT
PAD MAGNETIC INST (MISCELLANEOUS) IMPLANT
PAD OB MATERNITY 4.3X12.25 (PERSONAL CARE ITEMS) ×4 IMPLANT
PROTECTOR NERVE ULNAR (MISCELLANEOUS) ×8 IMPLANT
SCALPEL HARMONIC ACE (MISCELLANEOUS) IMPLANT
SET IRRIG TUBING LAPAROSCOPIC (IRRIGATION / IRRIGATOR) IMPLANT
SOLUTION ELECTROLUBE (MISCELLANEOUS) IMPLANT
SPONGE LAP 18X18 X RAY DECT (DISPOSABLE) IMPLANT
STAPLER VISISTAT 35W (STAPLE) IMPLANT
STRIP CLOSURE SKIN 1/4X3 (GAUZE/BANDAGES/DRESSINGS) IMPLANT
SUT CHROMIC 2 0 TIES 18 (SUTURE) IMPLANT
SUT MNCRL AB 3-0 PS2 27 (SUTURE) IMPLANT
SUT PDS AB 1 CT  36 (SUTURE)
SUT PDS AB 1 CT 36 (SUTURE) IMPLANT
SUT PLAIN 2 0 XLH (SUTURE) IMPLANT
SUT SILK 0 FSL (SUTURE) IMPLANT
SUT VIC AB 0 CT1 18XCR BRD8 (SUTURE) ×6 IMPLANT
SUT VIC AB 0 CT1 27 (SUTURE)
SUT VIC AB 0 CT1 27XBRD ANBCTR (SUTURE) IMPLANT
SUT VIC AB 0 CT1 8-18 (SUTURE) ×2
SUT VIC AB 2-0 CT1 (SUTURE) ×4 IMPLANT
SUT VIC AB 2-0 SH 27 (SUTURE) ×2
SUT VIC AB 2-0 SH 27XBRD (SUTURE) ×6 IMPLANT
SUT VIC AB 3-0 PS2 18 (SUTURE) ×1
SUT VIC AB 3-0 PS2 18XBRD (SUTURE) ×3 IMPLANT
SUT VIC AB 3-0 SH 27 (SUTURE)
SUT VIC AB 3-0 SH 27X BRD (SUTURE) IMPLANT
SUT VICRYL 0 ENDOLOOP (SUTURE) IMPLANT
SUT VICRYL 0 TIES 12 18 (SUTURE) ×4 IMPLANT
SUT VICRYL 0 UR6 27IN ABS (SUTURE) ×8 IMPLANT
SYR 20CC LL (SYRINGE) ×4 IMPLANT
SYR 50ML LL SCALE MARK (SYRINGE) IMPLANT
SYR CONTROL 10ML LL (SYRINGE) IMPLANT
SYR TB 1ML 25GX5/8 (SYRINGE) IMPLANT
SYR TB 1ML LUER SLIP (SYRINGE) ×4 IMPLANT
TOWEL OR 17X24 6PK STRL BLUE (TOWEL DISPOSABLE) ×8 IMPLANT
TRAY FOLEY CATH 14FR (SET/KITS/TRAYS/PACK) ×4 IMPLANT
TROCAR BALL TOP DISP 5MM (ENDOMECHANICALS) ×4 IMPLANT
TROCAR Z-THREAD BLADED 11X100M (TROCAR) IMPLANT
WARMER LAPAROSCOPE (MISCELLANEOUS) ×4 IMPLANT
WATER STERILE IRR 1000ML POUR (IV SOLUTION) IMPLANT

## 2012-12-18 NOTE — Transfer of Care (Signed)
Immediate Anesthesia Transfer of Care Note  Patient: Makayla Frazier  Procedure(s) Performed: Procedure(s) (LRB) with comments: LAPAROSCOPIC ASSISTED VAGINAL HYSTERECTOMY (N/A) INTRAUTERINE DEVICE (IUD) REMOVAL (N/A) - Removed during prep by E. Lowell Guitar PA BILATERAL SALPINGECTOMY (Bilateral)  Patient Location: PACU  Anesthesia Type:General  Level of Consciousness: sedated  Airway & Oxygen Therapy: Patient Spontanous Breathing and Patient connected to face mask oxygen  Post-op Assessment: Report given to PACU RN and Post -op Vital signs reviewed and stable  Post vital signs: stable  Complications: No apparent anesthesia complications

## 2012-12-18 NOTE — Op Note (Signed)
OPERATIVE REPORT   INDICATIONS:abnormal uterine bleeding and History of recurrent pulmonary emboli  PRE-OP DIAGNOSIS:Menorrhagia, Endometrial Hyperplasia, Hx of Pulmonary Embolism   POST-OP DIAGNOSIS;Menorrhagia, Endometrial Hyperplasia, Hx of Pulmonary Embolism   PROCEDURE:Procedure(s) (LRB): LAPAROSCOPIC ASSISTED VAGINAL HYSTERECTOMY (N/A) INTRAUTERINE DEVICE (IUD) REMOVAL (N/A) BILATERAL SALPINGECTOMY (Bilateral)   SURGEON:Kealohilani Maiorino P  ASSIST: Henreitta Leber, certified physician Asst.  SPECIMENS: Uterus and cervix with left fallopian tube                         Right fallopian tube  DISPOSITION OF SPECIMEN:Pathology  EBL: 250 cc  COMPLICATIONS: None  Findings:  The uterus and tubes were within normal limits. The ovaries were marked with multiple cysts consistent with polycystic ovarian syndrome. No excrescences or peritoneal implants were noted. The anterior and posterior cul-de-sacs were free of any adhesions. There were filmy adhesions between the omentum and the right pelvic sidewall.  PATIENT TO:  PACU - hemodynamically stable.  PROCEDURE DETAILS: The patient was taken to the operating room after appropriate identification and placed on the operating table.  After the attainment of adequate general anesthesia the patient was placed in the modified lithotomy position. The abdomen was prepped with chlor prep. The perineum and vagina were prepped with multiple layers of Betadine. A Foley catheter was inserted into the bladder and connected to straight drainage. A Graves speculum was placed in the vagina and the Mirena IUD removed easily from the cervix. A Hulka tenaculum was placed on the cervix and within the endometrial cavity as a uterine manipulator the abdomen and  Perineum were draped as a sterile field. Subumbilical and suprapubic injections of quarter percent Marcaine were undertaken.  A subumbilical incision was made at the site of her previous subumbilical incision  and carried down to the fascia. The fascia was grasped with Coker clamps and incised. The peritoneum was subsequently incised. The peritoneum and fascia were marked with a suture of 0 Vicryl on either side of the incision. The Hassan cannula was placed into the peritoneal cavity under direct visualization. The sutures were used to hold the cannula in place. The laparoscope was placed through the trocar sleeve.  The above-noted findings were made. Suprapubic incisions to the right and left of midline. Were made and laparoscopic probe trochars placed through those incisions into the peritoneal cavity under direct visualization. The above-noted and adhesions were lysed sharply. The utero-ovarian ligament on the right side was successively cauterized and cut. The round ligament on the right side was successively cauterized and cut. The mesosalpinx along the tubo-ovarian junction was successively cauterized and cut down to the uterotubal junction. Once the round ligament had been cut. The anterior peritoneum was incised and taken down to the level of the cervix in the midline. Hemostasis was noted to be adequate and the ovary on the left side was visualized with the attached to. A similar procedure was carried out with the utero-ovarian ligament, the round ligament. The uterotubal junction was cauterized and incised and the anterior peritoneum and carried down to the cervix to meet the other side, allowing creation of a bladder flap. At this point the CO2 was allowed to escape and the perineum became. The site of operation.  A weighted speculum was placed in the posterior vagina and Lahey tenaculae were placed on the anterior and posterior surfaces of the cervix. The cervicovaginal mucosa was injected with a dilute solution of Pitressin. The cervix was circumscribed. The anterior vaginal mucosa wasbluntly dissected off the  anterior cervix and the anterior peritoneum entered. The bladder blade was placed and the bladder  elevated. The posterior peritoneum was entered sharply and tagged. Uterosacral ligaments on the right and left side were clamped cut and suture ligated, and the sutures held. The paracervical tissues were then clamped cut and suture ligated. The uterine arteries were clamped cut and suture ligated. The parametral tissues were clamped, cut, and suture ligated.The upper pedicles had been freed laparoscopically, so that the uterus and cervix could be removed from the operative field.  The McCall culdoplasty sutures were then placed incorporating the uterosacral ligaments on either side, and the intervening peritoneum.  These were held. The vaginal angles were created with the sutures from the  uterosacral ligaments that had been held and placed through the anterior and posterior surfaces of the vagina then tied down. The remainder of the vaginal cuff was closed with figure-of-eight sutures of.  All sutures used were .0 Vicryl  The McCall culdoplasty sutures were then tied down. A 2 inch plain vaginal packing moistened with KY gel  was placed in the vagina.  The surgeon changed gloves and the pneumoperitoneum was recreated. The instruments were reinserted and the left fallopian 2 elevated. The tubo-ovarian tissue was successively cauterized and cut along the entire length of the tube and the tube removed through the [facility] cannula. The operative site for hysterectomy was inspected for hemostasis and a small area of bleeding was addressed with cautery on the right pelvic sidewall. Copious irrigation was carried out and hemostasis noted to be adequate. All instruments were then removed from the peritoneal cavity under direct visualization as the CO2 was allowed to escape. The subumbilical incision was closed with figure-of-eight sutures using the holding sutures of 0 Vicryl from the Cannula. A Subcutaneous Stitch of 3-0 Vicryl Was Placed. The Skin Incision in the Suprapubic and Subumbilical Incisions Were Closed with  Dermabond.   The patient was awakened from general anesthesia and taken to the recovery room in satisfactory condition having tolerated the procedure well the sponge and instrument counts correct.  Hal Morales, MD 4:02 PM

## 2012-12-18 NOTE — Anesthesia Postprocedure Evaluation (Signed)
Anesthesia Post Note  Patient: Makayla Frazier  Procedure(s) Performed: Procedure(s) (LRB): LAPAROSCOPIC ASSISTED VAGINAL HYSTERECTOMY (N/A) INTRAUTERINE DEVICE (IUD) REMOVAL (N/A) BILATERAL SALPINGECTOMY (Bilateral)  Anesthesia type: GA  Patient location: PACU  Post pain: Pain level controlled  Post assessment: Post-op Vital signs reviewed  Last Vitals:  Filed Vitals:   12/18/12 0956  BP: 154/94  Pulse: 85  Temp: 36.9 C  Resp: 18    Post vital signs: Reviewed  Level of consciousness: sedated  Complications: No apparent anesthesia complications

## 2012-12-18 NOTE — Anesthesia Postprocedure Evaluation (Deleted)
  Anesthesia Post-op Note  Patient: Makayla Frazier  Procedure(s) Performed: Procedure(s) (LRB) with comments: LAPAROSCOPIC ASSISTED VAGINAL HYSTERECTOMY (N/A) INTRAUTERINE DEVICE (IUD) REMOVAL (N/A) - Removed during prep by E. Powell PA BILATERAL SALPINGECTOMY (Bilateral)  Patient Location: PACU  Anesthesia Type:General  Level of Consciousness: sedated  Airway and Oxygen Therapy: Patient Spontanous Breathing and Patient connected to face mask oxygen  Post-op Pain: mild  Post-op Assessment: Patient's Cardiovascular Status Stable and Respiratory Function Stable  Post-op Vital Signs: stable  Complications: No apparent anesthesia complications

## 2012-12-18 NOTE — Progress Notes (Signed)
Day of Surgery Procedure(s) (LRB): LAPAROSCOPIC ASSISTED VAGINAL HYSTERECTOMY (N/A) INTRAUTERINE DEVICE (IUD) REMOVAL (N/A) BILATERAL SALPINGECTOMY (Bilateral)  Subjective: Patient reports tolerating PO.  Ambulated in the hall.  C/o low pelvic crampy sensation.  No nausea or vomitin  Objective: BP 160/105  Pulse 99  Temp 98.3 F (36.8 C) (Oral)  Resp 15  Ht 5' (1.524 m)  Wt 179 lb (81.194 kg)  BMI 34.96 kg/m2  SpO2 100%  I have reviewed patient's vital signs and medications.  General: alert, cooperative, mild distress and moderately obese Resp: clear to auscultation bilaterally Cardio: regular rate and rhythm, S1, S2 normal, no murmur, click, rub or gallop GI: appropriately tender to palpatin Extremities: extremities normal, atraumatic, no cyanosis or edema Vaginal Bleeding: no blood on pad Most recent CBG 219 at 8 pm. Assessment: s/p Procedure(s) (LRB) with comments: LAPAROSCOPIC ASSISTED VAGINAL HYSTERECTOMY (N/A) INTRAUTERINE DEVICE (IUD) REMOVAL (N/A) - Removed during prep by E. Powell PA BILATERAL SALPINGECTOMY (Bilateral): stable and tolerating diet Diabetes Hypertension Plan:for 12/19/12 Advance diet Restart chronic meds tonight as has tolerated po's Encourage ambulation Advance to PO medication Possible Discharge home  LOS: 0 days    Makayla Frazier P 12/18/2012, 9:00 PM

## 2012-12-18 NOTE — H&P (Signed)
  History and Physical Interval Note:   12/18/2012   10:49 AM   Makayla Frazier  has presented today for surgery, with the diagnosis of Menorrhagia, Endometrial Hyperplasia, Hx of Pulmonary Embolism  The various methods of treatment have been discussed with the patient and family. After consideration of risks, benefits and other options for treatment, the patient has consented to  Procedure(s): LAPAROSCOPIC ASSISTED VAGINAL HYSTERECTOMY, possible HYSTERECTOMY ABDOMINAL and bilateral salpingectomy as a surgical intervention .  I have reviewed the patients' chart and labs. She has discontinued all anticoagulants but understands that bleeding is still one of the risks of her surgery in addition to anesthesia, infection and damage to adjacent organs.  Her hx of Pulmonary embolism does put her at increased risk for thromboembolic events and she accepts this, but understands we have worked with her hemotologist to minimize that risk.  I have explained the rationale for removal of her fallopian tubes to attempt to decrease the risk of ovarian cancer. Questions were answered to the patient's satisfaction.     Hal Morales  MD

## 2012-12-18 NOTE — H&P (Signed)
Makayla Frazier is a 42 y.o. female G0P0 who presents for hysterectomy because of menorrhagia for over a year. Patient's bleeding pattern is characterized by a seven day flow with clots that require her to change her pad hourly. She will also have mid-cycle spotting of 3 day duration almost monthly. With any vaginal bleeding, [name] will have cramping that is rated as a 10/10 on a 10 point pain scale and is able to find relief only with Tramadol (decreasing discomfort to 3/10). She denies vaginitis symptoms, urinary tract or bowel symptoms and was given a Mirena IUD in 2011 to help with her flow with some improvement. A TSH in August was within normal limits and so was a CBC however, the red blood cell morphology showed hypochromic/microcytic cells. An endometrial biopsy in July 2013 revealed benign finding with no hyperplasia, atypia or malignancy. Patient has a history of two left lower extremity DVTs in 2009 & 2010. In June of 2013 she developed a pulmonary embolism while on anticoagulation with Coumadin.  She is now on Xarelto for anticoagulation. Pelvic ultrasound 08/2010 showed a uterus: 8.15 x 4.40 x 3.60 cm with #2 hyperechoic masses 0.91 cm and 0.81 cm with single color doppler blood flow; both ovaries appeared normal on that study. The patient subsequently underwent a hysteroscopy with dilatation and curettage for those endometrial findings.Given that she is unable to tolerate hormonal management of her vaginal bleeding, she has decided to proceed with definitive therapy in the form of hysterectomy.   Past Medical History   OB History: G0P0   GYN History: menarche: 42 YO; LMP: 11/06/12; Contracepiton: abstinence; IUD (Mirena placed 2011 The patient reports a past history of: HPV. and HSV-2 Abnormal PAP smear at age 7 treated with cryotherapy and normal since; Last PAP smear: July 2013   Medical History: Pulmonary Embolism (June 2013) , left DVT (2009 & 2010), hypertension, asthma, syncopal  episodes believed to be related to migraines, migraines, diabetes mellitus, hyperlipidemia, major depression (suicide attempt 2009) and obstructive sleep apnea, simple hyperplasia without atypia (2011), vitamin D deficiency, hypothyroidism, anemia, chest wall pain, and neuropathy   Surgical History: 1999 Cholecystectomy; 2010 Implantable Loop Cardiac Recorder; 2011 Breast Reduction; 2002 & 2011 Hysteroscopy with D & C  Denies problems with anesthesia or history of blood transfusions   Family History: hypercoagulopathy, hypertension, diabetes mellitus, bone cancer and asthma   Social History: Single and disabled; Denies alcohol, tobacco or illicit drug use   Outpatient Encounter Prescriptions as of 12/12/2012   Medication  Sig  Dispense  Refill   .  albuterol (PROVENTIL,VENTOLIN) 90 MCG/ACT inhaler  1-2 puffs every 4-6 hours as needed for wheezing or shortness of breath     .  insulin aspart (NOVOLOG) 100 UNIT/ML injection  Inject 9 Units into the skin 3 (three) times daily as needed. For blood sugar over 200 at meal time     .  insulin glargine (LANTUS) 100 UNIT/ML injection  Inject 56 Units into the skin at bedtime.     Marland Kitchen  lisinopril (PRINIVIL,ZESTRIL) 20 MG tablet  Take 20 mg by mouth daily.     .  rivaroxaban (XARELTO) 10 MG TABS tablet  Take 20 mg by mouth daily.     Marland Kitchen  acetaminophen-codeine (TYLENOL #2) 300-15 MG per tablet  Take 1 tablet by mouth every 4 (four) hours as needed.      Allergies   Allergen  Reactions   .  Codeine  Nausea And Vomiting       .  Darvocet (Propoxyphene-Acetaminophen)      vomiting   .  Hydrocodone-Acetaminophen      Vomiting   .  Latex      rash   .  Oxycodone-Acetaminophen      vomiting   .  Percocet (Oxycodone-Acetaminophen)      vomiting    Denies sensitivity to peanuts, shellfish or soy products. Adhesives cause a rash   ROS: Admits to glasses, occasional chest pain and shortness of breath (evaluated by Dr. Johney Frame), syncopal episodes, occasional  migraines, Denies vision changes, nasal congestion, dysphagia, tinnitus, hoarseness, cough, nausea, vomiting, diarrhea,constipation, urinary frequency, urgency dysuria, hematuria, vaginitis symptoms, swelling of joints,easy bruising, myalgias, arthralgias, skin rashes, unexplained weight loss and except as is mentioned in the history of present illness, patient's review of systems is otherwise negative.   Physical Exam   BP 110/66  Resp 16  Ht 5' (1.524 m)  Wt 180 lb (81.647 kg)  BMI 35.15 kg/m2  LMP 11/06/2012  Neck: supple without masses or thyromegaly  Lungs: clear to auscultation  Heart: regular rate and rhythm  Abdomen: soft, non-tender and no organomegaly  Pelvic:EGBUS- wnl; vagina-normal rugae; uterus-normal size, cervix without lesions or motion tenderness; adnexae-no tenderness or masses  Extremities: no clubbing, cyanosis or edema   Assesment: Menorrhagia                       Dysmenorrhea                      S/P Pulmonary Embolism                      Anticoagulation Therapy   Disposition: A discussion was held with patient regarding the indication for her procedure(s) along with the risks, which include but are not limited to: reaction to anesthesia, damage to adjacent organs, infection, excessive bleeding and the possible need for an open abdominal incision. The patient verbalized understanding of these risks and has consented to proceed with a Laparoscopically Assisted Vaginal Hysterectomy with a possible Total Abdominal Hysterectomy at [facility] [place] on December 18, 2012 at 1045am   CSN# [id number]  [place] J. Lowell Guitar, PA-C for Dr. Maris Berger. [place] Nunley

## 2012-12-18 NOTE — Anesthesia Preprocedure Evaluation (Addendum)
Anesthesia Evaluation  Patient identified by MRN, date of birth, ID band Patient awake    Reviewed: Allergy & Precautions, H&P , NPO status , Patient's Chart, lab work & pertinent test results, reviewed documented beta blocker date and time   History of Anesthesia Complications Negative for: history of anesthetic complications  Airway Mallampati: I TM Distance: >3 FB Neck ROM: full    Dental  (+) Teeth Intact   Pulmonary asthma (albuterol inhaler only - last inhaler use last week (used to use it daily) - has never been on steroids) , neg sleep apnea, PE (H/o multiple PEs last was 6/13 - has been on xarelto - transitioned to lovenox for this surgery - last dose yesterday) breath sounds clear to auscultation  Pulmonary exam normal       Cardiovascular Exercise Tolerance: Good hypertension (154/94 today, takes lisinopril at night), On Medications DVT Rhythm:regular Rate:Normal  Has implanted loop recorder - has never detected an arrhythmia - not a pacemaker  TTE 8/13 - EF 55-60%, normal echo    Neuro/Psych  Headaches, PSYCHIATRIC DISORDERS (major depression - h/o suicide attempt in 2009) Syncopal episodes TIA (6/13) Neuromuscular disease (left leg neuropathy - numbness and tingling, also left arm)    GI/Hepatic negative GI ROS, Neg liver ROS,   Endo/Other  diabetes, Type 2, Insulin DependentMorbid obesity  Renal/GU   Female GU complaint     Musculoskeletal   Abdominal   Peds  Hematology negative hematology ROS (+)   Anesthesia Other Findings   Reproductive/Obstetrics negative OB ROS                          Anesthesia Physical Anesthesia Plan  ASA: III  Anesthesia Plan: General ETT   Post-op Pain Management:    Induction:   Airway Management Planned:   Additional Equipment:   Intra-op Plan:   Post-operative Plan:   Informed Consent: I have reviewed the patients History and  Physical, chart, labs and discussed the procedure including the risks, benefits and alternatives for the proposed anesthesia with the patient or authorized representative who has indicated his/her understanding and acceptance.   Dental Advisory Given  Plan Discussed with: CRNA and Surgeon  Anesthesia Plan Comments:         Anesthesia Quick Evaluation

## 2012-12-19 ENCOUNTER — Encounter (HOSPITAL_COMMUNITY): Payer: Self-pay | Admitting: Obstetrics and Gynecology

## 2012-12-19 DIAGNOSIS — M79609 Pain in unspecified limb: Secondary | ICD-10-CM

## 2012-12-19 LAB — CBC
HCT: 33.8 % — ABNORMAL LOW (ref 36.0–46.0)
Hemoglobin: 11.1 g/dL — ABNORMAL LOW (ref 12.0–15.0)
MCH: 25.6 pg — ABNORMAL LOW (ref 26.0–34.0)
MCHC: 32.8 g/dL (ref 30.0–36.0)
MCV: 77.9 fL — ABNORMAL LOW (ref 78.0–100.0)
Platelets: 287 10*3/uL (ref 150–400)
RBC: 4.34 MIL/uL (ref 3.87–5.11)
RDW: 15 % (ref 11.5–15.5)
WBC: 14.7 10*3/uL — ABNORMAL HIGH (ref 4.0–10.5)

## 2012-12-19 LAB — GLUCOSE, CAPILLARY
Glucose-Capillary: 144 mg/dL — ABNORMAL HIGH (ref 70–99)
Glucose-Capillary: 123 mg/dL — ABNORMAL HIGH (ref 70–99)

## 2012-12-19 MED ORDER — TRAMADOL HCL 50 MG PO TABS: 100.0000 mg | ORAL_TABLET | ORAL | Status: DC | PRN

## 2012-12-19 MED FILL — Heparin Sodium (Porcine) Inj 5000 Unit/ML: INTRAMUSCULAR | Qty: 1 | Status: AC

## 2012-12-19 NOTE — Progress Notes (Signed)
Pt is discharge in the care of husband Downstairs per ambulatory Stable.  Denies any pain or discomfort. Spirit is good. Lapsite are good. Understood all instructions well

## 2012-12-19 NOTE — Progress Notes (Signed)
Makayla Frazier is a9 y.o.  [id number]  Post Op Date # 1, LAVH/IUD Removal/bilateral salpingectomy  Subjective: Patient is Doing well postoperatively. Patient has Pain is controlled with current analgesics. Medications being used: PCA Dilaudid., Transient lightheadedness last night with ambulation and complains of left leg numbness with tenderness whenever the SCD hose cycles.  States discomfort is not severe, just noticeable. Has a hx of chronic leg pain, but is at high risk for DVT and PE. Tolerated Regular diet but has not voided yet.  Objective: Vital signs in last 24 hours: Temp:  [98.1 F (36.7 C)-99.1 F (37.3 C)] 98.3 F (36.8 C) (01/09 0522) Pulse Rate:  [79-107] 90  (01/09 0522) Resp:  [10-18] 16  (01/09 0516) BP: (115-168)/(66-105) 115/66 mmHg (01/09 0522) SpO2:  [92 %-100 %] 100 % (01/09 0522) Weight:  [179 lb (81.194 kg)] 179 lb (81.194 kg) (01/08 1715)  Intake/Output from previous day: 01/08 0701 - 01/09 0700 In: 5468.7 [P.O.:540; I.V.:4928.7] Out: 3450 [Urine:3200] Intake/Output this shift:    Lab 12/19/12 0700 12/18/12 1556 12/16/12 1404  WBC 14.7* 21.4* 9.9  HGB 11.1* 11.9* 13.0  HCT 33.8* 36.4 40.2  PLT 287 324 306     Lab 12/18/12 1556 12/18/12 1130 12/12/12 1001  NA -- -- 135  K -- -- 3.9  CL -- -- 99  CO2 -- -- 26  BUN -- -- 9  CREATININE 0.65 -- 0.69  CALCIUM -- -- 9.2  PROT -- -- --  BILITOT -- -- --  ALKPHOS -- -- --  ALT -- -- --  AST -- -- --  GLUCOSE -- 122* 191*    EXAM: General: alert, cooperative and no distress Resp: clear to auscultation bilaterally Cardio: regular rate and rhythm, S1, S2 normal, no murmur, click, rub or gallop GI: Biowel sounds present; incisions with no evidence of infection and intact. Extremities: SCD hose in place and functioning, left calf tenderness with positive Homan's sign; right leg witout tenderness and negative Homan's sign Vaginal discharge: faint dry stain   Assessment: s/p  Procedure(s): LAPAROSCOPIC ASSISTED VAGINAL HYSTERECTOMY INTRAUTERINE DEVICE (IUD) REMOVAL BILATERAL SALPINGECTOMY: stable and Left Calf Tenderness  Plan: Advance diet, discontinue PCA and consult physician about Doppler Studies on Left Lower Extremity.  If negative will plan, discharge home  Resume anticoagulation at 3:30 p.m.  LOS: 1 day    POWELL,ELMIRA, PA-C 12/19/2012 7:08 AM

## 2012-12-19 NOTE — Anesthesia Postprocedure Evaluation (Signed)
  Anesthesia Post-op Note  Patient: Makayla Frazier  Procedure(s) Performed: Procedure(s) (LRB) with comments: LAPAROSCOPIC ASSISTED VAGINAL HYSTERECTOMY (N/A) INTRAUTERINE DEVICE (IUD) REMOVAL (N/A) - Removed during prep by E. Powell PA BILATERAL SALPINGECTOMY (Bilateral)  Patient Location: [facility]  Anesthesia Type:General  Level of Consciousness: awake  Airway and Oxygen Therapy: Patient Spontanous Breathing  Post-op Pain: mild  Post-op Assessment: Patient's Cardiovascular Status Stable and Respiratory Function Stable  Post-op Vital Signs: stable  Complications: No apparent anesthesia complications

## 2012-12-19 NOTE — Discharge Planning (Deleted)
Physician Discharge Summary  Patient ID: Makayla Frazier MRN: [id number] DOB/AGE: 42-Aug-1972 42 y.o.  Admit date: 12/18/2012 Discharge date: 12/19/2012   Discharge Diagnoses:  Menorrhagia, Endometrial Hyperplasia and History of Recurrent Pulmonary Emboli Active Problems:  * No active hospital problems. *  History of recurrent pulmonary emboli. Chronic hypertension. Type 2 diabetes. Obesity.   Operation: Laparoscopically Assisted Vaginal Hysterectomy, Mirena IUD Removal and Bilateral Salpingectomy   Discharged Condition: Good  Hospital Course: On the date of admission the patient underwent the aforementioned procedures and tolerated procedures well.  Post operative course was marked by patient complaining of left lower extremity numbness with calf tenderness. However, subsequent Venous Doppler Study was negative for deep vein thrombosis. By post operative day #1 the patient had resumed regular bowel and bladder function, was ambulating normally, and was therefore deemed ready for discharge home.   Disposition: 01-Home or Self Care  Discharge Medications:   Alejah, Aristizabal  Home Medication Instructions [id number]   Printed on:12/19/12 2202  Medication Information                    albuterol (PROVENTIL,VENTOLIN) 90 MCG/ACT inhaler 1-2 puffs every 4-6 hours as needed for wheezing or shortness of breath            insulin aspart (NOVOLOG) 100 UNIT/ML injection Inject 9 Units into the skin 3 (three) times daily as needed. For blood sugar over 200 at meal time           lisinopril (PRINIVIL,ZESTRIL) 20 MG tablet Take 20 mg by mouth daily.           acetaminophen-codeine (TYLENOL #2) 300-15 MG per tablet Take 1 tablet by mouth every 4 (four) hours as needed.           insulin glargine (LANTUS) 100 UNIT/ML injection Inject 56 Units into the skin at bedtime.           topiramate (TOPAMAX) 25 MG tablet            enoxaparin (LOVENOX) 120 MG/0.8ML injection Inject 0.81  mLs (120 mg total) into the skin daily.           traMADol (ULTRAM) 50 MG tablet Take 2 tablets (100 mg total) by mouth every 4 (four) hours as needed.               Follow-up: Dr. Pennie Rushing, January 29, 2013 at 9 a.m.   SignedHenreitta Leber, PA-C 12/19/2012, 10:02 PM

## 2012-12-19 NOTE — Progress Notes (Signed)
Left:  No evidence of DVT, superficial thrombosis, or Baker's cyst.  Right:  Negative for DVT in the common femoral vein.  

## 2012-12-19 NOTE — Addendum Note (Signed)
Addendum  created 12/19/12 0747 by Emmalene Kattner L Lasonia Casino, CRNA   Modules edited:Notes Section    

## 2012-12-19 NOTE — Progress Notes (Addendum)
Vaginal packing removed as ordered with small amount dark vaginal drainage noted.  Patient tolerated well

## 2012-12-22 NOTE — Discharge Summary (Signed)
Patient ID:  [name]  MRN: [id number]  DOB/AGE: 42-Oct-1972 42 y.o.   Admit date: 12/18/2012  Discharge date: 12/19/2012  Discharge Diagnoses: Menorrhagia, Endometrial Hyperplasia and History of Recurrent Pulmonary Emboli status post laparoscopically assisted vaginal hysterectomy and bilateral salpingectomy  Active Problems:  History of recurrent pulmonary emboli.  Chronic hypertension.  Type 2 diabetes.  Obesity.   Operation: Laparoscopically Assisted Vaginal Hysterectomy, Mirena IUD Removal and Bilateral Salpingectomy   Discharged Condition: Good   Hospital Course: On the date of admission the patient underwent the aforementioned procedures and tolerated procedures well. Post operative course was marked by patient complaining of left lower extremity numbness with calf tenderness. However, subsequent Venous Doppler Study was negative for deep vein thrombosis. By post operative day #1 the patient had resumed regular bowel and bladder function, was ambulating normally, and was therefore deemed ready for discharge home.  Disposition: 01-Home or Self Care  Discharge Medications:   Ebony, Yorio   Home Medication Instructions  WUJ:[id number]    Printed on:12/19/12 2202   Medication Information                     albuterol (PROVENTIL,VENTOLIN) 90 MCG/ACT inhaler  1-2 puffs every 4-6 hours as needed for wheezing or shortness of breath           insulin aspart (NOVOLOG) 100 UNIT/ML injection  Inject 9 Units into the skin 3 (three) times daily as needed. For blood sugar over 200 at meal time           lisinopril (PRINIVIL,ZESTRIL) 20 MG tablet  Take 20 mg by mouth daily.           acetaminophen-codeine (TYLENOL #2) 300-15 MG per tablet  Take 1 tablet by mouth every 4 (four) hours as needed.           insulin glargine (LANTUS) 100 UNIT/ML injection  Inject 56 Units into the skin at bedtime.           topiramate (TOPAMAX) 25 MG tablet           enoxaparin (LOVENOX) 120 MG/0.8ML  injection  Inject 0.81 mLs (120 mg total) into the skin daily.           traMADol (ULTRAM) 50 MG tablet  Take 2 tablets (100 mg total) by mouth every 4 (four) hours as needed.           Follow-up: Dr. Pennie Rushing, January 29, 2013 at 9 a.m.  SignedHenreitta Leber, PA-C  12/19/2012, 10:02 PM  Revision History.Marland KitchenMarland Kitchen

## 2012-12-25 ENCOUNTER — Other Ambulatory Visit: Payer: Self-pay | Admitting: *Deleted

## 2012-12-25 DIAGNOSIS — D649 Anemia, unspecified: Secondary | ICD-10-CM

## 2012-12-26 ENCOUNTER — Encounter: Payer: Self-pay | Admitting: Adult Health

## 2012-12-26 ENCOUNTER — Ambulatory Visit (HOSPITAL_BASED_OUTPATIENT_CLINIC_OR_DEPARTMENT_OTHER): Payer: Medicare Other | Admitting: Adult Health

## 2012-12-26 ENCOUNTER — Other Ambulatory Visit (HOSPITAL_BASED_OUTPATIENT_CLINIC_OR_DEPARTMENT_OTHER): Payer: Medicare Other | Admitting: Lab

## 2012-12-26 ENCOUNTER — Telehealth: Payer: Self-pay | Admitting: *Deleted

## 2012-12-26 VITALS — BP 141/85 | HR 97 | Temp 97.3°F | Resp 20 | Ht 60.0 in | Wt 176.0 lb

## 2012-12-26 DIAGNOSIS — Z86718 Personal history of other venous thrombosis and embolism: Secondary | ICD-10-CM

## 2012-12-26 DIAGNOSIS — D649 Anemia, unspecified: Secondary | ICD-10-CM

## 2012-12-26 DIAGNOSIS — I2699 Other pulmonary embolism without acute cor pulmonale: Secondary | ICD-10-CM

## 2012-12-26 DIAGNOSIS — D6859 Other primary thrombophilia: Secondary | ICD-10-CM

## 2012-12-26 DIAGNOSIS — R300 Dysuria: Secondary | ICD-10-CM

## 2012-12-26 LAB — COMPREHENSIVE METABOLIC PANEL (CC13)
ALT: 33 U/L (ref 0–55)
Albumin: 3.1 g/dL — ABNORMAL LOW (ref 3.5–5.0)
CO2: 27 mEq/L (ref 22–29)
Calcium: 9.3 mg/dL (ref 8.4–10.4)
Chloride: 100 mEq/L (ref 98–107)
Creatinine: 0.9 mg/dL (ref 0.6–1.1)
Sodium: 136 mEq/L (ref 136–145)
Total Protein: 7.6 g/dL (ref 6.4–8.3)

## 2012-12-26 LAB — CBC WITH DIFFERENTIAL/PLATELET
BASO%: 0.5 % (ref 0.0–2.0)
HCT: 37.4 % (ref 34.8–46.6)
MCHC: 32.8 g/dL (ref 31.5–36.0)
MONO#: 0.7 10*3/uL (ref 0.1–0.9)
NEUT%: 59 % (ref 38.4–76.8)
WBC: 9.7 10*3/uL (ref 3.9–10.3)
lymph#: 3 10*3/uL (ref 0.9–3.3)

## 2012-12-26 LAB — URINALYSIS, MICROSCOPIC - CHCC
Glucose: 2 g/dL
Leukocyte Esterase: NEGATIVE
Nitrite: NEGATIVE

## 2012-12-26 NOTE — Progress Notes (Signed)
OFFICE PROGRESS NOTE  CC  Windy Carina, PA-C [address] [place] [address] Dr. Dierdre Forth  DIAGNOSIS: 42 year old female with hypercoagulability presented with pulmonary embolism last PE was May 18 2012. Patient is currently on Xeralto. She is awaiting a hysterectomy for management of her dysfunctional uterine bleeding.  PRIOR THERAPY:Graph #1 patient has a history of left DVT on pulmonary embolism in 2009 after being on birth control pills. For that she was treated for one year.  #2 in 2010 patient developed a second DVT but she remained off anticoagulation. Due to the dysfunction uterine bleeding patient has been on Miranda IUD since 2011.  #3 in 05/18/2012 patient developed pulmonary embolism again. She was begun on anticoagulation with xeralto. Patient will eventually have a hysterectomy for management of her dysfunctional uterine bleeding.  CURRENT THERAPY: xeralto  INTERVAL HISTORY: Makayla Frazier 42 y.o. female returns for Followup visit today.  She had a hysterectomy on 1/8 and has been taking Lovenox as prescribed since she was discharged from the hospital.  She had an uncomplicated hospitalization, and didn't have any excessive bleeding, or oozing from her surgical site.  She's been doing well and denies fevers, chills, chest pain, shortness of breath.  She has developed some dysuria that's been worsening since her discharge from the hospital.  She did have a urinary catheter for a day while in the hospital.  Otherwise a 10 point ROS is neg.   MEDICAL HISTORY: Past Medical History  Diagnosis Date  . PE (pulmonary embolism)     2009 - on oral contraception; multiple  . Syncopal episodes     unknown etiology  . Dysfunctional uterine bleeding     Seen by Dr. Pennie Rushing, GYN; pending hysterectomy as of 07/2012  . Asthma   . Obesity   . Mastodynia   . Hypertension   . Post - coital bleeding 2012  . Labial lesion 2013  . BV (bacterial vaginosis) 2012  .  Oligomenorrhea 2011  . DVT (deep venous thrombosis) 2011    pt had IUD; also 05/2012  . H/O hypercoagulable state     2/2 contraception  . HLD (hyperlipidemia)   . DM (diabetes mellitus)     diagnosed 2009  . Headache   . Anemia   . Herpes simplex without mention of complication 06/2011    HSV-2  . Thyroid disease     hypothyroidism  . Vitamin D deficiency   . Major depression     Hx suicide attempt in 2009, [facility] admissions  . Venous insufficiency   . Sleep apnea   . Neuropathy     ALLERGIES:  is allergic to codeine; darvocet; hydrocodone-acetaminophen; latex; oxycodone-acetaminophen; and percocet.  MEDICATIONS:  Current Outpatient Prescriptions  Medication Sig Dispense Refill  . acetaminophen-codeine (TYLENOL #2) 300-15 MG per tablet Take 1 tablet by mouth every 4 (four) hours as needed.      Marland Kitchen albuterol (PROVENTIL,VENTOLIN) 90 MCG/ACT inhaler 1-2 puffs every 4-6 hours as needed for wheezing or shortness of breath       . insulin aspart (NOVOLOG) 100 UNIT/ML injection Inject 9 Units into the skin 3 (three) times daily as needed. For blood sugar over 200 at meal time      . insulin glargine (LANTUS) 100 UNIT/ML injection Inject 56 Units into the skin at bedtime.      Marland Kitchen lisinopril (PRINIVIL,ZESTRIL) 20 MG tablet Take 20 mg by mouth daily.      Marland Kitchen topiramate (TOPAMAX) 25 MG tablet       .  traMADol (ULTRAM) 50 MG tablet Take 2 tablets (100 mg total) by mouth every 4 (four) hours as needed.  60 tablet  2  . XARELTO 20 MG TABS Take 20 mg by mouth Daily.        SURGICAL HISTORY:  Past Surgical History  Procedure Date  . Breast reduction surgery     Dr. Kathie Dike [facility] 2011  . Cholecystectomy   . Cardiac electrophysiology study & dft     Implantable Loop recorder  . Endometrial biopsy 2011  . Dilatation & currettage/hysteroscopy with resectocope 2007 & 2013  . Laparoscopic assisted vaginal hysterectomy 12/18/2012    Procedure: LAPAROSCOPIC ASSISTED VAGINAL HYSTERECTOMY;  Surgeon:  Hal Morales, MD;  Location: [facility];  Service: Gynecology;  Laterality: N/A;  . Iud removal 12/18/2012    Procedure: INTRAUTERINE DEVICE (IUD) REMOVAL;  Surgeon: Hal Morales, MD;  Location: [facility];  Service: Gynecology;  Laterality: N/A;  Removed during prep by E. Lowell Guitar PA  . Bilateral salpingectomy 12/18/2012    Procedure: BILATERAL SALPINGECTOMY;  Surgeon: Hal Morales, MD;  Location: [facility];  Service: Gynecology;  Laterality: Bilateral;    REVIEW OF SYSTEMS:   General: fatigue (-), night sweats (-), fever (-), pain (-) Lymph: palpable nodes (-) HEENT: vision changes (-), mucositis (-), gum bleeding (-), epistaxis (-) Cardiovascular: chest pain (-), palpitations (-) Pulmonary: shortness of breath (-), dyspnea on exertion (-), cough (-), hemoptysis (-) GI:  Early satiety (-), melena (-), dysphagia (-), nausea/vomiting (-), diarrhea (-) GU: dysuria (-), hematuria (-), incontinence (-) Musculoskeletal: joint swelling (-), joint pain (-), back pain (-) Neuro: weakness (-), numbness (-), headache (-), confusion (-) Skin: Rash (-), lesions (-), dryness (-) Psych: depression (-), suicidal/homicidal ideation (-), feeling of hopelessness (-)   PHYSICAL EXAMINATION:  General: Patient is a well appearing female in no acute distress HEENT: PERRLA, sclerae anicteric no conjunctival pallor, MMM Neck: supple, no palpable adenopathy Lungs: clear to auscultation bilaterally, no wheezes, rhonchi, or rales Cardiovascular: regular rate rhythm, S1, S2, no murmurs, rubs or gallops Abdomen: Soft, non-tender, non-distended, normoactive bowel sounds, no HSM Extremities: warm and well perfused, no clubbing, cyanosis, or edema Skin: No rashes or lesions Neuro: Non-focal ECOG PERFORMANCE STATUS: 1 - Symptomatic but completely ambulatory  Blood pressure 141/85, pulse 97, temperature 97.3 F (36.3 C), temperature source Oral, resp. rate 20, height 5' (1.524 m), weight 176 lb (79.833 kg), last  menstrual period 11/06/2012.  LABORATORY DATA: Lab Results  Component Value Date   WBC 9.7 12/26/2012   HGB 12.2 12/26/2012   HCT 37.4 12/26/2012   MCV 78.6* 12/26/2012   PLT 347 12/26/2012      Chemistry      Component Value Date/Time   NA 136 12/26/2012 1318   NA 135 12/12/2012 1001   K 4.0 12/26/2012 1318   K 3.9 12/12/2012 1001   CL 100 12/26/2012 1318   CL 99 12/12/2012 1001   CO2 27 12/26/2012 1318   CO2 26 12/12/2012 1001   BUN 7.0 12/26/2012 1318   BUN 9 12/12/2012 1001   CREATININE 0.9 12/26/2012 1318   CREATININE 0.65 12/18/2012 1556   CREATININE 0.64 01/26/2011 1030      Component Value Date/Time   CALCIUM 9.3 12/26/2012 1318   CALCIUM 9.2 12/12/2012 1001   ALKPHOS 60 12/26/2012 1318   ALKPHOS 54 08/03/2012 1428   AST 18 12/26/2012 1318   AST 15 08/03/2012 1428   ALT 33 12/26/2012 1318   ALT 19 08/03/2012 1428  BILITOT 0.31 12/26/2012 1318   BILITOT 0.3 08/03/2012 1428       RADIOGRAPHIC STUDIES:  No results found.  ASSESSMENT: 42 year old female with:  #1 hypercoagulability with PE. Patient had a complete hyppercoag panel performed which was negative. Thus I do believe that her hypercoagulability and her recurrent thrombosis is due to contraception.She is now on xeralto tolerating it well without any evidence of bleeding. Patient is now scheduled for a hysterectomy on 12/18/12  #2 Patient has had several hospitalizations and emergency room visits for syncopal episodes. She is seeing a neurologist who feels that patient most likely has migraines.    PLAN:  #1 Ms. Bullard will restart her Gibson Ramp today at her previous dose.  I ordered a urinalysis to eval for UTI.  Will call her with the results.    #2. We will see her back next month to evaluate how she is tolerating her Xeralto restart.    I spent 25 minutes counseling the patient face to face. The total time spent in the appointment was 30 minutes.   Cherie Ouch Lyn Hollingshead, NP Medical Oncology [facility] Phone: [phone]

## 2012-12-26 NOTE — Patient Instructions (Signed)
Doing well.  You may restart your Xarelto 20mg  daily.  We will get a urinalysis on you today and call you with the results and if you need any antibiotics.  Please call us if you have any questions or concerns.

## 2012-12-26 NOTE — Telephone Encounter (Signed)
Gave patient appointment for 01-27-2013

## 2012-12-27 ENCOUNTER — Other Ambulatory Visit: Payer: Self-pay | Admitting: Adult Health

## 2012-12-27 ENCOUNTER — Other Ambulatory Visit: Payer: Self-pay | Admitting: Oncology

## 2012-12-27 ENCOUNTER — Telehealth: Payer: Self-pay | Admitting: *Deleted

## 2012-12-27 DIAGNOSIS — N390 Urinary tract infection, site not specified: Secondary | ICD-10-CM

## 2012-12-27 MED ORDER — NITROFURANTOIN MACROCRYSTAL 100 MG PO CAPS: 100.0000 mg | ORAL_CAPSULE | Freq: Four times a day (QID) | ORAL | Status: DC

## 2012-12-27 NOTE — Telephone Encounter (Signed)
Please let patient know macrobid prescription has been called in for her.

## 2012-12-27 NOTE — Telephone Encounter (Signed)
Pt called lmovm " I was calling to find out my Urine results from 12/26/12."  Will review with MD

## 2012-12-28 ENCOUNTER — Inpatient Hospital Stay (HOSPITAL_COMMUNITY)
Admission: AD | Admit: 2012-12-28 | Discharge: 2012-12-28 | Disposition: A | Discharge status: Home / Self Care | Payer: Medicare Other | Source: Ambulatory Visit | Attending: Obstetrics and Gynecology | Admitting: Obstetrics and Gynecology

## 2012-12-28 ENCOUNTER — Encounter (HOSPITAL_COMMUNITY): Payer: Self-pay | Admitting: *Deleted

## 2012-12-28 DIAGNOSIS — N760 Acute vaginitis: Secondary | ICD-10-CM | POA: Insufficient documentation

## 2012-12-28 DIAGNOSIS — R319 Hematuria, unspecified: Secondary | ICD-10-CM | POA: Insufficient documentation

## 2012-12-28 DIAGNOSIS — B9689 Other specified bacterial agents as the cause of diseases classified elsewhere: Secondary | ICD-10-CM

## 2012-12-28 DIAGNOSIS — A499 Bacterial infection, unspecified: Secondary | ICD-10-CM | POA: Insufficient documentation

## 2012-12-28 LAB — URINALYSIS, ROUTINE W REFLEX MICROSCOPIC
Glucose, UA: >1000 mg/dL — AB
Ketones, ur: NEGATIVE mg/dL
Protein, ur: NEGATIVE mg/dL

## 2012-12-28 LAB — CBC WITH DIFFERENTIAL/PLATELET
Basophils Absolute: 0 10*3/uL (ref 0.0–0.1)
Eosinophils Relative: 2 % (ref 0–5)
HCT: 35 % — ABNORMAL LOW (ref 36.0–46.0)
Hemoglobin: 11.9 g/dL — ABNORMAL LOW (ref 12.0–15.0)
Lymphocytes Relative: 46 % (ref 12–46)
MCHC: 34 g/dL (ref 30.0–36.0)
MCV: 75.6 fL — ABNORMAL LOW (ref 78.0–100.0)
Monocytes Absolute: 0.7 10*3/uL (ref 0.1–1.0)
Monocytes Relative: 9 % (ref 3–12)
RDW: 15.6 % — ABNORMAL HIGH (ref 11.5–15.5)

## 2012-12-28 LAB — URINE MICROSCOPIC-ADD ON

## 2012-12-28 LAB — WET PREP, GENITAL

## 2012-12-28 MED ORDER — METRONIDAZOLE 500 MG PO TABS: 500.0000 mg | ORAL_TABLET | Freq: Two times a day (BID) | ORAL | Status: DC

## 2012-12-28 MED ORDER — FLUCONAZOLE 150 MG PO TABS: 150.0000 mg | ORAL_TABLET | ORAL | Status: DC

## 2012-12-28 NOTE — Progress Notes (Signed)
History  Makayla Frazier is a 42 y.o. G0P0 at Unknown   Chief Complaint  Patient presents with  . Hematuria  . Dysuria   When I urinate and then wipe I see blood on tissue. Unsure of where blood is coming from. Had lap vag hyst 1/8.    Burns when I pee, started with bleeding with wiping 2 days ago. No abdominal pain, using stool softners, bm today.    Unknown   Chief Complaint  Patient presents with  . Hematuria  . Dysuria   @SFHPI @  Prior to Admission medications   Medication Sig Start Date End Date Taking? Authorizing Provider  acetaminophen-codeine (TYLENOL #2) 300-15 MG per tablet Take 1 tablet by mouth every 4 (four) hours as needed.    Historical Provider, MD  albuterol (PROVENTIL,VENTOLIN) 90 MCG/ACT inhaler 1-2 puffs every 4-6 hours as needed for wheezing or shortness of breath     Historical Provider, MD  insulin aspart (NOVOLOG) 100 UNIT/ML injection Inject 9 Units into the skin 3 (three) times daily as needed. For blood sugar over 200 at meal time    Historical Provider, MD  insulin glargine (LANTUS) 100 UNIT/ML injection Inject 56 Units into the skin at bedtime. 08/05/12   Annett Gula, MD  lisinopril (PRINIVIL,ZESTRIL) 20 MG tablet Take 20 mg by mouth daily.    Historical Provider, MD  nitrofurantoin (MACRODANTIN) 100 MG capsule Take 1 capsule (100 mg total) by mouth 4 (four) times daily. 12/27/12   Augustin Schooling, NP  topiramate (TOPAMAX) 25 MG tablet  10/11/12   Historical Provider, MD  traMADol (ULTRAM) 50 MG tablet Take 2 tablets (100 mg total) by mouth every 4 (four) hours as needed. 12/19/12   Hal Morales, MD  XARELTO 20 MG TABS Take 20 mg by mouth Daily. 12/10/12   Historical Provider, MD    Patient Active Problem List  Diagnosis  . DIABETES MELLITUS, TYPE II  . ANEMIA  . NEUROPATHY  . HYPERTENSION  . ASTHMA  . PRURITUS  . Syncope and collapse  . Knee pain, left  . Patellar tendinitis of left knee  . Pulmonary embolism  . TIA (transient  ischemic attack)  . Syncope  . Left-sided weakness  . Hypokalemia  . Blood glucose elevated  . Chest pain   Vitals:  Blood pressure 176/101, pulse 88, temperature 98.4 F (36.9 C), resp. rate 20, height 5' (1.524 m), weight 174 lb (78.926 kg), last menstrual period 11/06/2012, SpO2 98.00%. OB History    Grav Para Term Preterm Abortions TAB SAB Ect Mult Living   0 0              Past Medical History  Diagnosis Date  . PE (pulmonary embolism)     2009 - on oral contraception; multiple  . Syncopal episodes     unknown etiology  . Dysfunctional uterine bleeding     Seen by Dr. Pennie Rushing, GYN; pending hysterectomy as of 07/2012  . Asthma   . Obesity   . Mastodynia   . Hypertension   . Post - coital bleeding 2012  . Labial lesion 2013  . BV (bacterial vaginosis) 2012  . Oligomenorrhea 2011  . DVT (deep venous thrombosis) 2011    pt had IUD; also 05/2012  . H/O hypercoagulable state     2/2 contraception  . HLD (hyperlipidemia)   . DM (diabetes mellitus)     diagnosed 2009  . Headache   . Anemia   . Herpes simplex without  mention of complication 06/2011    HSV-2  . Thyroid disease     hypothyroidism  . Vitamin D deficiency   . Major depression     Hx suicide attempt in 2009, [facility] admissions  . Venous insufficiency   . Sleep apnea   . Neuropathy     Past Surgical History  Procedure Date  . Breast reduction surgery     Dr. Kathie Dike [facility] 2011  . Cholecystectomy   . Cardiac electrophysiology study & dft     Implantable Loop recorder  . Endometrial biopsy 2011  . Dilatation & currettage/hysteroscopy with resectocope 2007 & 2013  . Laparoscopic assisted vaginal hysterectomy 12/18/2012    Procedure: LAPAROSCOPIC ASSISTED VAGINAL HYSTERECTOMY;  Surgeon: Hal Morales, MD;  Location: [facility];  Service: Gynecology;  Laterality: N/A;  . Iud removal 12/18/2012    Procedure: INTRAUTERINE DEVICE (IUD) REMOVAL;  Surgeon: Hal Morales, MD;  Location: [facility];  Service:  Gynecology;  Laterality: N/A;  Removed during prep by E. Lowell Guitar PA  . Bilateral salpingectomy 12/18/2012    Procedure: BILATERAL SALPINGECTOMY;  Surgeon: Hal Morales, MD;  Location: [facility];  Service: Gynecology;  Laterality: Bilateral;    Family History  Problem Relation Age of Onset  . Melanoma Father 71  . Ulcerative colitis Mother 42  . Breast cancer Paternal Grandmother   . Diabetes type II Maternal Grandmother     deceased 39  . Pulmonary embolism Paternal Grandfather     History  Substance Use Topics  . Smoking status: Never Smoker   . Smokeless tobacco: Never Used  . Alcohol Use: No    Allergies:  Allergies  Allergen Reactions  . Codeine Nausea And Vomiting       . Darvocet (Propoxyphene-Acetaminophen)     vomiting  . Hydrocodone-Acetaminophen     Vomiting   . Latex     rash  . Oxycodone-Acetaminophen     vomiting  . Percocet (Oxycodone-Acetaminophen)     vomiting    Prescriptions prior to admission  Medication Sig Dispense Refill  . acetaminophen-codeine (TYLENOL #2) 300-15 MG per tablet Take 1 tablet by mouth every 4 (four) hours as needed.      Marland Kitchen albuterol (PROVENTIL,VENTOLIN) 90 MCG/ACT inhaler 1-2 puffs every 4-6 hours as needed for wheezing or shortness of breath       . insulin aspart (NOVOLOG) 100 UNIT/ML injection Inject 9 Units into the skin 3 (three) times daily as needed. For blood sugar over 200 at meal time      . insulin glargine (LANTUS) 100 UNIT/ML injection Inject 56 Units into the skin at bedtime.      Marland Kitchen lisinopril (PRINIVIL,ZESTRIL) 20 MG tablet Take 20 mg by mouth daily.      . nitrofurantoin (MACRODANTIN) 100 MG capsule Take 1 capsule (100 mg total) by mouth 4 (four) times daily.  28 capsule  0  . topiramate (TOPAMAX) 25 MG tablet       . traMADol (ULTRAM) 50 MG tablet Take 2 tablets (100 mg total) by mouth every 4 (four) hours as needed.  60 tablet  2  . XARELTO 20 MG TABS Take 20 mg by mouth Daily.        @ROS @ Physical Exam    Blood pressure 176/101, pulse 88, temperature 98.4 F (36.9 C), resp. rate 20, height 5' (1.524 m), weight 174 lb (78.926 kg), last menstrual period 11/06/2012, SpO2 98.00%.  @PHYSEXAMBYAGE2 @ Labs:  No results found for this or any previous visit (from  the past 24 hour(s)). ASSESSMENT: Patient Active Problem List  Diagnosis  . DIABETES MELLITUS, TYPE II  . ANEMIA  . NEUROPATHY  . HYPERTENSION  . ASTHMA  . PRURITUS  . Syncope and collapse  . Knee pain, left  . Patellar tendinitis of left knee  . Pulmonary embolism  . TIA (transient ischemic attack)  . Syncope  . Left-sided weakness  . Hypokalemia  . Blood glucose elevated  . Chest pain   Physical Examination:   Physical exam: Calm, no distress,  abd soft, gravid, nt, bowel sounds active, abdomen nontender, neg CVAT EGBUS WNL, sterile speculum exam,  vagina pink, moist scant pink mucus at vag cuff  ED Course  Assessment/Plan CBC, UA, wet prep pending Unknown Lavera Guise, CNM Addendum: With BV  Hematuria UA C&S pending, has RX macrodantin from office visit 1/16 and has not picked up yet use that first with diflucan RX then start flagy, f/o as scheduled.Lavera Guise, CNM

## 2012-12-28 NOTE — MAU Note (Signed)
When I urinate and then wipe I see blood on tissue. Unsure of where blood is coming from. Had lap vag hyst 1/8.

## 2012-12-28 NOTE — Progress Notes (Signed)
Wet prep done 

## 2012-12-28 NOTE — Progress Notes (Signed)
Written and verbal d/c instructions given and understanding voiced. 

## 2012-12-28 NOTE — Progress Notes (Signed)
E-signature not working so pt signed actual print out

## 2012-12-30 ENCOUNTER — Telehealth: Payer: Self-pay

## 2012-12-30 LAB — URINE CULTURE

## 2012-12-30 NOTE — Telephone Encounter (Signed)
Lm on vm to cb per telephone call with questions rgdg surgery.

## 2013-01-01 NOTE — Telephone Encounter (Signed)
Tc to pt per telephone call. Pt states,"went to ER on 12/30/12".

## 2013-01-24 ENCOUNTER — Other Ambulatory Visit: Payer: Self-pay | Admitting: Adult Health

## 2013-01-24 DIAGNOSIS — I2699 Other pulmonary embolism without acute cor pulmonale: Secondary | ICD-10-CM

## 2013-01-25 ENCOUNTER — Other Ambulatory Visit: Payer: Self-pay

## 2013-01-27 ENCOUNTER — Other Ambulatory Visit: Payer: Medicare Other | Admitting: Lab

## 2013-01-27 ENCOUNTER — Ambulatory Visit: Payer: Medicare Other | Admitting: Adult Health

## 2013-01-29 ENCOUNTER — Ambulatory Visit: Payer: Medicare Other | Admitting: Obstetrics and Gynecology

## 2013-01-29 ENCOUNTER — Encounter: Payer: Self-pay | Admitting: Obstetrics and Gynecology

## 2013-01-29 VITALS — BP 134/90 | Temp 98.0°F | Wt 179.0 lb

## 2013-01-29 DIAGNOSIS — N898 Other specified noninflammatory disorders of vagina: Secondary | ICD-10-CM

## 2013-01-29 DIAGNOSIS — Z90710 Acquired absence of both cervix and uterus: Secondary | ICD-10-CM

## 2013-01-29 DIAGNOSIS — N390 Urinary tract infection, site not specified: Secondary | ICD-10-CM

## 2013-01-29 DIAGNOSIS — B9689 Other specified bacterial agents as the cause of diseases classified elsewhere: Secondary | ICD-10-CM

## 2013-01-29 LAB — POCT WET PREP (WET MOUNT): pH: 5.5

## 2013-01-29 LAB — POCT OSOM TRICHOMONAS RAPID TEST: Trichomonas vaginalis: NEGATIVE

## 2013-01-29 LAB — POCT OSOM BVBLUE TEST: Bacterial Vaginosis: POSITIVE

## 2013-01-29 MED ORDER — FLUCONAZOLE 150 MG PO TABS: 150.0000 mg | ORAL_TABLET | Freq: Once | ORAL | Status: DC

## 2013-01-29 MED ORDER — METRONIDAZOLE 500 MG PO TABS: ORAL_TABLET | ORAL | Status: DC

## 2013-01-29 MED ORDER — METRONIDAZOLE 0.75 % VA GEL: VAGINAL | Status: DC

## 2013-01-29 NOTE — Progress Notes (Signed)
  Subjective:     Makayla Frazier is a 42 y.o. female who presents for post-op visit.  Pt still c/o vaginal odor occ. Was rx'd for BV and UTI on 12/28/12 with resolution of UTI sx.  Pathology report:as reviewed with patient.  DATE OF SURGERY: 12/18/12 TYPE OF SURGERY:LAPAROSCOPIC ASSISTED VAGINAL HYSTERECTOMY  INTRAUTERINE DEVICE (IUD) REMOVAL BILATERAL SALPINGECTOMY  PATH: 1. Uterus and cervix, with left fallopian tube - UTERINE CORPUS: ENDOMETRIUM: - BENIGN ENDOMETRIUM WITH PROGESTATIONAL EFFECT. - NO HYPERPLASIA, ATYPIA OR MALIGNANCY IDENTIFIED, SEE COMMENT. MYOMETRIUM: - BENIGN MYOMETRIUM. - NO ATYPIA OR MALIGNANCY IDENTIFIED. - UTERINE CERVIX: - BENIGN TRANSFORMATION ZONE MUCOSA. - NO DYSPLASIA, ATYPIA OR MALIGNANCY IDENTIFIED. 2. Fallopian tube, right - BENIGN FALLOPIAN TUBE TISSUE. - FULL CROSS SECTION IDENTIFIED. - NO TUMOR SEEN. PAIN:No VAG BLEEDING: no VAG DISCHARGE: no NORMAL GI FUNCTN: yes NORMAL GU FUNCTN: yes  The following portions of the patient's history were reviewed and updated as appropriate: allergies, current medications, past family history, past medical history, past social history, past surgical history and problem list.  Review of Systems Pertinent items are noted in HPI.   Objective:    BP 134/90  Temp(Src) 98 F (36.7 C)  Wt 179 lb (81.194 kg)  BMI 34.96 kg/m2  LMP 11/06/2012 Weight:  Wt Readings from Last 1 Encounters:  01/29/13 179 lb (81.194 kg)    BMI: Body mass index is 34.96 kg/(m^2).  General Appearance: Alert, appropriate appearance for age. No acute distress Lungs: clear to auscultation bilaterally Back: No CVA tenderness Cardiovascular: Regular rate and rhythm. S1, S2, no murmur Gastrointestinal: Soft, non-tender, no masses or organomegaly Incision/s: healing well Pelvic Exam: vaginal cuff healing well   Bimanual exam normal Results for orders placed in visit on 01/29/13  POCT OSOM BVBLUE TEST      Result Value Range    Bacterial Vaginosis pos    POCT OSOM TRICHOMONAS RAPID TEST      Result Value Range   Trichomonas vaginalis neg    POCT WET PREP (WET MOUNT)      Result Value Range   Source Wet Prep POC       WBC, Wet Prep HPF POC       Bacteria Wet Prep HPF POC       BACTERIA WET PREP MORPHOLOGY POC       Clue Cells Wet Prep HPF POC       CLUE CELLS WET PREP WHIFF POC Positive Whiff     Yeast Wet Prep HPF POC       KOH Wet Prep POC       Trichomonas Wet Prep HPF POC       pH 5.5      Assessment:    Postoperative course complicated by BV persistent vs recurrent Monilia  Operative findings again reviewed.   Plan:  Metrogel Diflucan OTC Luvena after intercourse Weekly oral Metronidazole for 3 mos F/u 11/2013 for aex or prn   Dierdre Forth MD 2/19/20149:04 AM

## 2013-01-29 NOTE — Patient Instructions (Signed)
OTC Luvena to be used after intercourse   Bacterial Vaginosis Bacterial vaginosis is an infection of the vagina. A healthy vagina has many kinds of good germs (bacteria). Sometimes the number of good germs can change. This allows bad germs to move in and cause an infection. You may be given medicine (antibiotics) to treat the infection. Or, you may not need treatment at all. HOME CARE  Take your medicine as told. Finish them even if you start to feel better.  Do not have sex until you finish your medicine.  Do not douche.  Practice safe sex.  Tell your sex partner that you have an infection. They should see their doctor for treatment if they have problems. GET HELP RIGHT AWAY IF:  You do not get better after 3 days of treatment.  You have grey fluid (discharge) coming from your vagina.  You have pain.  You have a temperature of 102 F (38.9 C) or higher. MAKE SURE YOU:   Understand these instructions.  Will watch your condition.  Will get help right away if you are not doing well or get worse. Document Released: 09/05/2008 Document Revised: 02/19/2012 Document Reviewed: 09/05/2008 [facility] Patient Information 2013 [place], [place].

## 2013-02-03 ENCOUNTER — Emergency Department (HOSPITAL_COMMUNITY): Payer: Medicare Other

## 2013-02-03 ENCOUNTER — Emergency Department (HOSPITAL_COMMUNITY)
Admission: EM | Admit: 2013-02-03 | Discharge: 2013-02-04 | Disposition: A | Discharge status: Home / Self Care | Payer: Medicare Other | Attending: Emergency Medicine | Admitting: Emergency Medicine

## 2013-02-03 ENCOUNTER — Encounter (HOSPITAL_COMMUNITY): Payer: Self-pay | Admitting: Emergency Medicine

## 2013-02-03 DIAGNOSIS — Z8659 Personal history of other mental and behavioral disorders: Secondary | ICD-10-CM | POA: Insufficient documentation

## 2013-02-03 DIAGNOSIS — E1142 Type 2 diabetes mellitus with diabetic polyneuropathy: Secondary | ICD-10-CM | POA: Insufficient documentation

## 2013-02-03 DIAGNOSIS — Z86711 Personal history of pulmonary embolism: Secondary | ICD-10-CM | POA: Insufficient documentation

## 2013-02-03 DIAGNOSIS — E669 Obesity, unspecified: Secondary | ICD-10-CM | POA: Insufficient documentation

## 2013-02-03 DIAGNOSIS — J45909 Unspecified asthma, uncomplicated: Secondary | ICD-10-CM | POA: Insufficient documentation

## 2013-02-03 DIAGNOSIS — Z8679 Personal history of other diseases of the circulatory system: Secondary | ICD-10-CM | POA: Insufficient documentation

## 2013-02-03 DIAGNOSIS — G4730 Sleep apnea, unspecified: Secondary | ICD-10-CM | POA: Insufficient documentation

## 2013-02-03 DIAGNOSIS — Z79899 Other long term (current) drug therapy: Secondary | ICD-10-CM | POA: Insufficient documentation

## 2013-02-03 DIAGNOSIS — Z8619 Personal history of other infectious and parasitic diseases: Secondary | ICD-10-CM | POA: Insufficient documentation

## 2013-02-03 DIAGNOSIS — E039 Hypothyroidism, unspecified: Secondary | ICD-10-CM | POA: Insufficient documentation

## 2013-02-03 DIAGNOSIS — Z794 Long term (current) use of insulin: Secondary | ICD-10-CM | POA: Insufficient documentation

## 2013-02-03 DIAGNOSIS — Z8742 Personal history of other diseases of the female genital tract: Secondary | ICD-10-CM | POA: Insufficient documentation

## 2013-02-03 DIAGNOSIS — E785 Hyperlipidemia, unspecified: Secondary | ICD-10-CM | POA: Insufficient documentation

## 2013-02-03 DIAGNOSIS — R55 Syncope and collapse: Secondary | ICD-10-CM

## 2013-02-03 DIAGNOSIS — Z862 Personal history of diseases of the blood and blood-forming organs and certain disorders involving the immune mechanism: Secondary | ICD-10-CM | POA: Insufficient documentation

## 2013-02-03 DIAGNOSIS — Z87448 Personal history of other diseases of urinary system: Secondary | ICD-10-CM | POA: Insufficient documentation

## 2013-02-03 DIAGNOSIS — Z86718 Personal history of other venous thrombosis and embolism: Secondary | ICD-10-CM | POA: Insufficient documentation

## 2013-02-03 DIAGNOSIS — M6281 Muscle weakness (generalized): Secondary | ICD-10-CM

## 2013-02-03 DIAGNOSIS — I10 Essential (primary) hypertension: Secondary | ICD-10-CM | POA: Insufficient documentation

## 2013-02-03 DIAGNOSIS — E1149 Type 2 diabetes mellitus with other diabetic neurological complication: Secondary | ICD-10-CM | POA: Insufficient documentation

## 2013-02-03 LAB — CBC WITH DIFFERENTIAL/PLATELET
Basophils Absolute: 0 10*3/uL (ref 0.0–0.1)
HCT: 36.8 % (ref 36.0–46.0)
Lymphocytes Relative: 37 % (ref 12–46)
Lymphs Abs: 2.5 10*3/uL (ref 0.7–4.0)
Neutro Abs: 3.8 10*3/uL (ref 1.7–7.7)
Platelets: 347 10*3/uL (ref 150–400)
RBC: 4.75 MIL/uL (ref 3.87–5.11)
RDW: 15 % (ref 11.5–15.5)
WBC: 6.7 10*3/uL (ref 4.0–10.5)

## 2013-02-03 LAB — COMPREHENSIVE METABOLIC PANEL
ALT: 23 U/L (ref 0–35)
AST: 16 U/L (ref 0–37)
Alkaline Phosphatase: 57 U/L (ref 39–117)
CO2: 26 mEq/L (ref 19–32)
Chloride: 99 mEq/L (ref 96–112)
GFR calc Af Amer: >90 mL/min (ref >90)
GFR calc non Af Amer: >90 mL/min (ref >90)
Glucose, Bld: 257 mg/dL — ABNORMAL HIGH (ref 70–99)
Potassium: 4.2 mEq/L (ref 3.5–5.1)
Sodium: 134 mEq/L — ABNORMAL LOW (ref 135–145)
Total Bilirubin: 0.3 mg/dL (ref 0.3–1.2)

## 2013-02-03 LAB — POCT I-STAT TROPONIN I

## 2013-02-03 LAB — URINALYSIS, ROUTINE W REFLEX MICROSCOPIC
Bilirubin Urine: NEGATIVE
Hgb urine dipstick: NEGATIVE
Protein, ur: NEGATIVE mg/dL
Specific Gravity, Urine: 1.023 (ref 1.005–1.030)
Urobilinogen, UA: 0.2 mg/dL (ref 0.0–1.0)

## 2013-02-03 MED ORDER — ACETAMINOPHEN 325 MG PO TABS
975.0000 mg | ORAL_TABLET | Freq: Once | ORAL | Status: AC
  Administered 2013-02-03: 975 mg via ORAL
  Filled 2013-02-03: qty 3

## 2013-02-03 NOTE — ED Notes (Addendum)
Pt here via EMS for a syncopal episode with positive LOC out for 20 sec pt now aox4Pt has a Loop recorder had x 37yrs

## 2013-02-03 NOTE — ED Notes (Addendum)
MRI paged to give ETA for pt, stated they are on the way now

## 2013-02-03 NOTE — ED Notes (Signed)
Pt reports being claustrophobic to MRI tech, stated she just wants a blanket instead of taking medication for anxiety at this time

## 2013-02-03 NOTE — ED Notes (Signed)
Pt is having her internal device read at this time.

## 2013-02-03 NOTE — ED Notes (Addendum)
Patient returned from CT

## 2013-02-03 NOTE — ED Notes (Signed)
EAV:[id number]> Expected date:02/03/13<BR> Expected time:12:28 PM<BR> Means of arrival:Ambulance<BR> Comments:<BR> syncope

## 2013-02-03 NOTE — ED Provider Notes (Signed)
History     CSN: [id number]  Arrival date & time 02/03/13  1249   First MD Initiated Contact with Patient 02/03/13 1328      Chief Complaint  Patient presents with  . Loss of Consciousness    (Consider location/radiation/quality/duration/timing/severity/associated sxs/prior treatment) HPI Makayla Frazier is a 42 y.o. female  with a hx of syncopal episodes, HTN, DVT, PE, depression presents to the Emergency Department complaining of acute, resolved syncopal episode onset 30 min prior to arrival.  Pt has seen cardiology who thinks these are seizures, but neurology says they are not.  Pt was in PCP office today for BP re-check when she became hot and had a syncopal episodes.  EMS reports from the MD that pt was unresponsive for 20 sec, but A&Ox4 on their arrival. Pt states she feels tired now, but is without further symptoms. Associated symptoms include fatigue.  Nothing makes it better and nothing makes it worse.  Pt denies fever, chills, headache, neck pain, chest pain, shortness of breath, abdominal pain, nausea, vomiting, diarrhea, dysuria, dizziness.  Patient also complaining of loss of sensation in her right leg.  History of TIA with left-sided hemiparesis for which she saw physical therapy. No symptoms on the left side today, no upper extremity symptoms.  Pt see Hillis Range at [place] for this.      Past Medical History  Diagnosis Date  . PE (pulmonary embolism)     2009 - on oral contraception; multiple  . Syncopal episodes     unknown etiology  . Dysfunctional uterine bleeding     Seen by Dr. Pennie Rushing, GYN; pending hysterectomy as of 07/2012  . Asthma   . Obesity   . Mastodynia   . Hypertension   . Post - coital bleeding 2012  . Labial lesion 2013  . BV (bacterial vaginosis) 2012  . Oligomenorrhea 2011  . DVT (deep venous thrombosis) 2011    pt had IUD; also 05/2012  . H/O hypercoagulable state     2/2 contraception  . HLD (hyperlipidemia)   . DM (diabetes mellitus)      diagnosed 2009  . Headache   . Anemia   . Herpes simplex without mention of complication 06/2011    HSV-2  . Thyroid disease     hypothyroidism  . Vitamin D deficiency   . Major depression     Hx suicide attempt in 2009, [facility] admissions  . Venous insufficiency   . Sleep apnea   . Neuropathy     Past Surgical History  Procedure Laterality Date  . Breast reduction surgery      Dr. Kathie Dike [facility] 2011  . Cholecystectomy    . Cardiac electrophysiology study & dft      Implantable Loop recorder  . Endometrial biopsy  2011  . Dilatation & currettage/hysteroscopy with resectocope  2007 & 2013  . Laparoscopic assisted vaginal hysterectomy  12/18/2012    Procedure: LAPAROSCOPIC ASSISTED VAGINAL HYSTERECTOMY;  Surgeon: Hal Morales, MD;  Location: [facility];  Service: Gynecology;  Laterality: N/A;  . Iud removal  12/18/2012    Procedure: INTRAUTERINE DEVICE (IUD) REMOVAL;  Surgeon: Hal Morales, MD;  Location: [facility];  Service: Gynecology;  Laterality: N/A;  Removed during prep by E. Lowell Guitar PA  . Bilateral salpingectomy  12/18/2012    Procedure: BILATERAL SALPINGECTOMY;  Surgeon: Hal Morales, MD;  Location: [facility];  Service: Gynecology;  Laterality: Bilateral;    Family History  Problem Relation Age of Onset  .  Melanoma Father 42  . Ulcerative colitis Mother 78  . Breast cancer Paternal Grandmother   . Diabetes type II Maternal Grandmother     deceased 54  . Pulmonary embolism Paternal Grandfather     History  Substance Use Topics  . Smoking status: Never Smoker   . Smokeless tobacco: Never Used  . Alcohol Use: No    OB History   Grav Para Term Preterm Abortions TAB SAB Ect Mult Living   0 0              Review of Systems  Constitutional: Negative for fever, diaphoresis, appetite change, fatigue and unexpected weight change.  HENT: Negative for mouth sores and neck stiffness.   Eyes: Negative for visual disturbance.  Respiratory: Negative for cough, chest  tightness, shortness of breath and wheezing.   Cardiovascular: Negative for chest pain.  Gastrointestinal: Negative for nausea, vomiting, abdominal pain, diarrhea and constipation.  Endocrine: Negative for polydipsia, polyphagia and polyuria.  Genitourinary: Negative for dysuria, urgency, frequency and hematuria.  Musculoskeletal: Negative for back pain.  Skin: Negative for rash.  Allergic/Immunologic: Negative for immunocompromised state.  Neurological: Positive for syncope, weakness and numbness. Negative for light-headedness and headaches.  Hematological: Does not bruise/bleed easily.  Psychiatric/Behavioral: Negative for sleep disturbance. The patient is not nervous/anxious.   All other systems reviewed and are negative.    Allergies  Codeine; Darvocet; Hydrocodone-acetaminophen; Latex; Oxycodone-acetaminophen; and Percocet  Home Medications   Current Outpatient Rx  Name  Route  Sig  Dispense  Refill  . albuterol (PROVENTIL,VENTOLIN) 90 MCG/ACT inhaler   Inhalation   Inhale 1-2 puffs into the lungs every 4 (four) hours as needed for shortness of breath.          . beclomethasone (QVAR) 80 MCG/ACT inhaler   Inhalation   Inhale 1 puff into the lungs 2 (two) times daily as needed (for shortness of breath.).         Marland Kitchen cetirizine (ZYRTEC) 10 MG tablet   Oral   Take 10 mg by mouth daily.         . insulin aspart (NOVOLOG) 100 UNIT/ML injection   Subcutaneous   Inject 4-8 Units into the skin 3 (three) times daily before meals.         . insulin glargine (LANTUS) 100 UNIT/ML injection   Subcutaneous   Inject 65 Units into the skin at bedtime.         Marland Kitchen lisinopril (PRINIVIL,ZESTRIL) 10 MG tablet   Oral   Take 20 mg by mouth daily.         Marland Kitchen lisinopril-hydrochlorothiazide (PRINZIDE,ZESTORETIC) 20-25 MG per tablet   Oral   Take 1 tablet by mouth daily.         . metroNIDAZOLE (METROGEL) 0.75 % vaginal gel   Vaginal   Place 1 Applicatorful vaginally at  bedtime. 5 day course of therapy started 01/30/13.         . Rivaroxaban (XARELTO) 20 MG TABS   Oral   Take 20 mg by mouth at bedtime.         . traMADol (ULTRAM) 50 MG tablet   Oral   Take 50-100 mg by mouth every 6 (six) hours as needed for pain.         . metroNIDAZOLE (FLAGYL) 500 MG tablet   Oral   Take 500 mg by mouth daily. 12-week prophylaxis starting 02/04/13.           BP 145/93  Pulse 103  Temp(Src)  98 F (36.7 C)  Resp 20  SpO2 99%  LMP 11/06/2012  Physical Exam  Nursing note and vitals reviewed. Constitutional: She is oriented to person, place, and time. She appears well-developed and well-nourished. No distress.  HENT:  Head: Normocephalic and atraumatic.  Right Ear: Tympanic membrane, external ear and ear canal normal.  Left Ear: Tympanic membrane, external ear and ear canal normal.  Nose: No mucosal edema or rhinorrhea.  Mouth/Throat: Uvula is midline, oropharynx is clear and moist and mucous membranes are normal. Mucous membranes are not dry and not cyanotic. No oropharyngeal exudate, posterior oropharyngeal edema, posterior oropharyngeal erythema or tonsillar abscesses.  Eyes: Conjunctivae and EOM are normal. Pupils are equal, round, and reactive to light. No scleral icterus.  Neck: Normal range of motion and full passive range of motion without pain. Neck supple. No spinous process tenderness and no muscular tenderness present. Normal range of motion present.  Cardiovascular: Normal rate, regular rhythm, S1 normal, S2 normal and intact distal pulses.   Pulses:      Radial pulses are 2+ on the right side, and 2+ on the left side.       Dorsalis pedis pulses are 2+ on the right side, and 2+ on the left side.       Posterior tibial pulses are 2+ on the right side, and 2+ on the left side.  Pulmonary/Chest: Effort normal and breath sounds normal. No accessory muscle usage. Not tachypneic. No respiratory distress. She has no decreased breath sounds. She has  no wheezes. She has no rhonchi. She has no rales.  Abdominal: Soft. Normal appearance and bowel sounds are normal. She exhibits no mass. There is no hepatosplenomegaly. There is no tenderness. There is no rigidity, no rebound, no guarding, no CVA tenderness, no tenderness at McBurney's point and negative Murphy's sign.  Musculoskeletal: Normal range of motion. She exhibits no edema.       Thoracic back: She exhibits no tenderness, no pain and no spasm.       Lumbar back: She exhibits no tenderness, no pain and no spasm.  Lymphadenopathy:    She has no cervical adenopathy.  Neurological: She is alert and oriented to person, place, and time. A sensory deficit is present. No cranial nerve deficit. Coordination normal. GCS eye subscore is 4. GCS verbal subscore is 5. GCS motor subscore is 6.  Reflex Scores:      Tricep reflexes are 2+ on the right side and 2+ on the left side.      Bicep reflexes are 2+ on the right side and 2+ on the left side.      Brachioradialis reflexes are 2+ on the right side and 2+ on the left side.      Patellar reflexes are 1+ on the right side and 2+ on the left side.      Achilles reflexes are 0 on the right side and 2+ on the left side. Speech is clear and goal oriented, follows commands Major Cranial nerves without deficit, no facial droop Normal strength in upper extremities bilaterally including strong and equal grip strength Decreased strength in the LLE at baseline, absent movement in the RLE Sensation normal to light and sharp touch on the L and absent sensation on the RLE below the knee Moves upper extremities without ataxia, coordination intact Normal finger to nose and rapid alternating movements Unable to ambulate  Skin: Skin is warm and dry. No rash noted. She is not diaphoretic. No erythema.  Psychiatric: She has  a normal mood and affect.    ED Course  Procedures (including critical care time)  Labs Reviewed  CBC WITH DIFFERENTIAL - Abnormal;  Notable for the following:    MCV 77.5 (*)    MCH 25.5 (*)    All other components within normal limits  COMPREHENSIVE METABOLIC PANEL - Abnormal; Notable for the following:    Sodium 134 (*)    Glucose, Bld 257 (*)    BUN 5 (*)    Albumin 3.4 (*)    All other components within normal limits  URINALYSIS, ROUTINE W REFLEX MICROSCOPIC - Abnormal; Notable for the following:    Glucose, UA >1000 (*)    Ketones, ur TRACE (*)    All other components within normal limits  URINE MICROSCOPIC-ADD ON - Abnormal; Notable for the following:    Squamous Epithelial / LPF MANY (*)    All other components within normal limits  POCT I-STAT TROPONIN I   Ct Head Wo Contrast  02/03/2013  *RADIOLOGY REPORT*  Clinical Data: Headache following fall and head injury.  CT HEAD WITHOUT CONTRAST  Technique:  Contiguous axial images were obtained from the base of the skull through the vertex without contrast.  Comparison: 08/03/2012  Findings: Mild generalized cerebral volume loss noted.  No acute intracranial abnormalities are identified, including mass lesion or mass effect, hydrocephalus, extra-axial fluid collection, midline shift, hemorrhage, or acute infarction.  The visualized bony calvarium is unremarkable.  IMPRESSION: No evidence of acute intracranial abnormality.   Original Report Authenticated By: Harmon Pier, M.D.    Ct Thoracic Spine Wo Contrast  02/03/2013  *RADIOLOGY REPORT*  Clinical Data: Right leg pain.  Numbness.  Syncope.  CT THORACIC SPINE WITHOUT CONTRAST  Technique:  Multidetector CT imaging of the thoracic spine was performed without intravenous contrast administration. Multiplanar CT image reconstructions were also generated  Comparison: 08/30/2012  Findings: There is posterior spurring and possibly ossification of the posterior longitudinal ligament in the included portion of the cervical spine there is potentially some mild central stenosis at the C4, C5, and C5-6 levels due to this spurring.   This could be better investigated on a dedicated cervical spine CT, which would have higher spatial resolution on the multiplanar portion of the exam.  On today's examination, a cervical spine assessment was not requested.  Mild intervertebral spurring is present in the right lateral recess at T7-8.  Borderline right eccentric central stenosis and T9-10 due to intervertebral spurring.  IMPRESSION:  1.  Possible mild central stenosis at C4, C5, and C5-6 due to intervertebral spurring and potential ossification along the posterior longitudinal ligament. Findings appear relatively similar to the prior dedicated cervical spine CT examination of 05/18/2012. Please note that today's exam was not performed as a dedicated cervical spine assessment. 2.  Borderline right eccentric central stenosis at T9-10 due to intervertebral spurring. 3.  Mild intervertebral spurring in the right lateral recess at T7- 8, without definite impingement.   Original Report Authenticated By: Gaylyn Rong, M.D.    Ct Lumbar Spine Wo Contrast  02/03/2013  *RADIOLOGY REPORT*  Clinical Data: Right leg pain and numbness.  CT LUMBAR SPINE WITHOUT CONTRAST  Technique:  Multidetector CT imaging of the lumbar spine was performed without intravenous contrast administration. Multiplanar CT image reconstructions were also generated.  Comparison: 05/02/2010  Findings: Vacuum phenomenon noted in both sacroiliac joints, with borderline widening of the sacroiliac joints.  Bilateral facet arthropathy is present throughout the lumbar spine; and T12 through the L4-5 level, there  is a shallow spurring extending from the margin of the superior articular facet along the adjacent to the inferior articular facet posteriorly as shown on image 25 of series 7.  There is 2 mm degenerative posterior subluxation of the L5-S1 level.  No other malalignment observed.  No pars defects or lumbar spine fracture.  At L4-5 there is a mild disc bulge, without obvious  impingement.  At L5-S1, the facet spurring causes mild bilateral foraminal stenosis.  IMPRESSION:  1.  Mild bilateral foraminal stenosis at L5-S1 secondary to facet spurring.  There is also facet spurring throughout the lumbar spine. 2.  Mild disc bulge at L4-5, without observed impingement at L4-5. 3.  Vacuum phenomenon noted in both sacroiliac joints, which are borderline widened.   Original Report Authenticated By: Gaylyn Rong, M.D.     ECG:  Date: 02/03/2013  Rate: 76  Rhythm: normal sinus rhythm  QRS Axis: normal  Intervals: normal  ST/T Wave abnormalities: nonspecific ST changes  Conduction Disutrbances:none  Narrative Interpretation: T wave flattening in V4-V6, changed from previous  Old EKG Reviewed: changes noted   1. Syncope       MDM  MANAL KREUTZER presents for syncopal episode today.  Patient with a history of syncope for which she wears a loop recorder. Patient history consistent with vasovagal syncope today.  Patient also with absent movement and sensation in the right lower extremity after episode today.  UA contaminated but without other evidence of urinary tract infection, CMP with elevated glucose to patient baseline per previous labs, CBC unremarkable, troponin negative, ECG nonischemic.   CT lumbar and thoracic spine with central stenosis at T9-T10 and stable central stenosis of the C-spine. Also with noted L5-S1 disc bulge.  CT head negative.  Consult with neurology as patient has a history of TIA. They're recommending MRI of the brain.  Consulted with [facility] cardiology he states the loop recorder will need to be interrogated prior to the MRI.  I have discussed this with [employer] stool, and interrogate the loop recorder tonight. Patient is then to have MRI of the brain.  Loop recorder has been interrogated and pulmonary report shows no events.  MRI head has been ordered.  Plan: If MRI of the brain is negative and this is diagnosed as a complicated  migraine and patient is to be discharged home. If positive we will reconsult neurology.  Pt discussed with Gara Kroner and she will follow and dispo.        Dahlia Client Tyson Parkison, PA-C 02/03/13 2049

## 2013-02-03 NOTE — Consult Note (Signed)
Reason for Consult: Right lower extremity weakness Referring Physician: ED physician  CC: Right lower extremity weakness  History is obtained from: Patient, fianc  HPI: Makayla Frazier is a 42 y.o. female  Has a history of syncopal episodes that are felt to be likely vasovagal. She has an implanted loop recorder, and had another episode of syncope today. She states that she felt hot and then collapsed. Following her collapse, she noticed that she had right lower extremity weakness and numbness below the knee. This has improved somewhat, but is still persistent. She states that she feels numb circumferentially below the knee on her right. She has a history of a single episode of left-sided weakness following which she had a negative MRI, but required physical therapy to improve. She also endorses tingling in her leg.  She endorses a unilateral throbbing headache associated with photophobia.    LKW: This morning. tpa given: no, outside of window at my evaluation   ROS: A 14 point ROS was performed and is negative except as noted in the HPI.  Past Medical History  Diagnosis Date  . PE (pulmonary embolism)     2009 - on oral contraception; multiple  . Syncopal episodes     unknown etiology  . Dysfunctional uterine bleeding     Seen by Dr. Pennie Rushing, GYN; pending hysterectomy as of 07/2012  . Asthma   . Obesity   . Mastodynia   . Hypertension   . Post - coital bleeding 2012  . Labial lesion 2013  . BV (bacterial vaginosis) 2012  . Oligomenorrhea 2011  . DVT (deep venous thrombosis) 2011    pt had IUD; also 05/2012  . H/O hypercoagulable state     2/2 contraception  . HLD (hyperlipidemia)   . DM (diabetes mellitus)     diagnosed 2009  . Headache   . Anemia   . Herpes simplex without mention of complication 06/2011    HSV-2  . Thyroid disease     hypothyroidism  . Vitamin D deficiency   . Major depression     Hx suicide attempt in 2009, [facility] admissions  . Venous insufficiency    . Sleep apnea   . Neuropathy     Family History: Mother ulcerative colitis  Social History: Tob: Never smoker  Exam: Current vital signs: BP 145/93  Pulse 103  Temp(Src) 98 F (36.7 C)  Resp 20  SpO2 99%  LMP 11/06/2012 Vital signs in last 24 hours: Temp:  [98 F (36.7 C)-98.3 F (36.8 C)] 98 F (36.7 C) (02/24 1641) Pulse Rate:  [103-111] 103 (02/24 1641) Resp:  [20] 20 (02/24 1641) BP: (145-161)/(93-99) 145/93 mmHg (02/24 1641) SpO2:  [96 %-99 %] 99 % (02/24 1641)  General: In bed, talking with fianc CV: Regular rate and rhythm Mental Status: Patient is awake, alert, oriented to person, place, month, year, and situation. Immediate and remote memory are intact. Patient is able to give a clear and coherent history. Ablet o give # of quarters in $2.75 Cranial Nerves: II: Visual Fields are full. Pupils are equal, round, and reactive to light.  Discs are difficult to visualize. III,IV, VI: EOMI without ptosis or diploplia.  V: Facial sensation is symmetric to temperature VII: Facial movement is symmetric.  VIII: hearing is intact to voice X: Uvula elevates symmetrically XI: Shoulder shrug is symmetric. XII: tongue is midline without atrophy or fasciculations.  Motor: Tone is normal. Bulk is normal. 5/5 strength was present in left side and right arm.  In her right leg she displays initially 2/5 weakness dorsiflexion plantarflexion, however with encouragement she is able to provide at least 3/5 in both. On standing, she is able to stand on both her heels as well as her toes and take a few steps in this position. Sensory: Sensation is symmetric to light touch and temperature in the arms and legs up to the knee. Below the knee on the right, she has markedly decreased sensation to light touch as well as absent temperature. Deep Tendon Reflexes: 2+ and symmetric in the biceps and patellae.  Plantars: Toes are downgoing bilaterally.  Cerebellar: FNF are intact  bilaterally Gait: She tends to place more her weight on her right leg (her week one) but is able to take a couple of steps. She is able to stay on her toes and heels.   I have reviewed labs in epic and the results pertinent to this consultation are: CMP unremarkable, mild anemia  I have reviewed the images obtained: CT head unremarkable, I do not feel that her spinal imaging gives explanation for her symptoms  Impression: 42 year old female with circumferential numbness and weakness across multiple myotomes of her right lower extremity. This does not fit a clear peripheral distribution, hypoperfusion stroke secondary to a distal stenosis is difficult to rule out therefore we'll recommend an MRI. My suspicion, however is that this represents a psychogenic presentation, although with the headache complicated migraine is also difficult to rule out.  Recommendations: 1) MRI brain 2) if this is normal the patient safe to discharge with analgesics for headache.  Ritta Slot, MD [facility] [phone]  If 7pm- 7am, please page neurology on call at [phone].

## 2013-02-03 NOTE — ED Notes (Addendum)
[employer] fax given to PA at this time

## 2013-02-04 NOTE — ED Provider Notes (Signed)
Pt received from muthersbaugh, PA-C.  Pt presented to ED w/ syncopal episode.  H/o same and thought to be vasovagal.  Developed RLE weakness/numbness immediately following.  H/o LLE weakness w/ neg MRI but required short course of PT.  On initial ED exam, complete loss of sensation and active mobility of RLE.  Dr. Amada Jupiter consulted and exam improved at that time.  He recommended MRI of brain.  Dr. Karma Ganja spoke w/ Dr. Corky Downs, radiology, who says that the diffusion-weighted images have been lost but may be retrievable tomorrow.  Pt re-examined by myself and she now has normal sensation and mobility of her RLE.  She has ambulated independently.  Consulted Dr. Roseanne Reno who has reviewed the images available and believes that her symptoms are functional.  Recommends discharge and that she continue her xaralto.  Relayed to patient.  Return precautions discussed.  12:28 AM   Otilio Miu, PA-C 02/04/13 0028

## 2013-02-04 NOTE — Progress Notes (Signed)
Spoke with Dr Petra Kuba about pt not having diffusion weighted images due to scanner crashing and losing all images. Diffusion weighted images were lost and not recoverable. Per Dr Petra Kuba ok for pt to not come back to MRI. For further imaging. Due to pt symptoms going back to normal before time of discharge.  Spoke with dr Constance Goltz as well at 11:51am. On 02-04-13

## 2013-02-04 NOTE — ED Provider Notes (Signed)
Medical screening examination/treatment/procedure(s) were performed by non-physician practitioner and as supervising physician I was immediately available for consultation/collaboration.  Ethelda Chick, MD 02/04/13 [phone]

## 2013-02-05 NOTE — ED Provider Notes (Signed)
Medical screening examination/treatment/procedure(s) were conducted as a shared visit with non-physician practitioner(s) and myself.  I personally evaluated the patient during the encounter  Makayla Frazier is a 42 y.o. female here with syncope. Hx of vasosvagal syncope and today's incident was similar to previous episode. However, unable to move R leg and had no sensation to R leg afterwards. No back pain. Unable to get MRI initially given that she has a loop recorder. The PA will consult neurology to dictate workup. I signed out to Dr. Karma Ganja in the ED for further management.    Richardean Canal, MD 02/05/13 [phone]

## 2013-03-14 ENCOUNTER — Telehealth: Payer: Self-pay | Admitting: Internal Medicine

## 2013-03-14 NOTE — Telephone Encounter (Signed)
03-14-13 lmm @ 959am to call to set up past due pacer ck/mt

## 2013-03-27 ENCOUNTER — Other Ambulatory Visit: Payer: Self-pay | Admitting: Internal Medicine

## 2013-03-27 ENCOUNTER — Ambulatory Visit (INDEPENDENT_AMBULATORY_CARE_PROVIDER_SITE_OTHER): Payer: Medicare Other | Admitting: *Deleted

## 2013-03-27 DIAGNOSIS — R55 Syncope and collapse: Secondary | ICD-10-CM

## 2013-03-27 NOTE — Progress Notes (Signed)
ILR interrogation 

## 2013-05-14 ENCOUNTER — Encounter: Payer: Self-pay | Admitting: Internal Medicine

## 2013-05-17 ENCOUNTER — Emergency Department (HOSPITAL_COMMUNITY)
Admission: EM | Admit: 2013-05-17 | Discharge: 2013-05-17 | Disposition: A | Discharge status: Home / Self Care | Payer: Medicare Other | Attending: Emergency Medicine | Admitting: Emergency Medicine

## 2013-05-17 ENCOUNTER — Emergency Department (HOSPITAL_COMMUNITY): Payer: Medicare Other

## 2013-05-17 ENCOUNTER — Encounter (HOSPITAL_COMMUNITY): Payer: Self-pay | Admitting: *Deleted

## 2013-05-17 DIAGNOSIS — Z7901 Long term (current) use of anticoagulants: Secondary | ICD-10-CM | POA: Insufficient documentation

## 2013-05-17 DIAGNOSIS — Z86718 Personal history of other venous thrombosis and embolism: Secondary | ICD-10-CM | POA: Insufficient documentation

## 2013-05-17 DIAGNOSIS — E669 Obesity, unspecified: Secondary | ICD-10-CM | POA: Insufficient documentation

## 2013-05-17 DIAGNOSIS — R404 Transient alteration of awareness: Secondary | ICD-10-CM | POA: Insufficient documentation

## 2013-05-17 DIAGNOSIS — R569 Unspecified convulsions: Secondary | ICD-10-CM

## 2013-05-17 DIAGNOSIS — Z79899 Other long term (current) drug therapy: Secondary | ICD-10-CM | POA: Insufficient documentation

## 2013-05-17 DIAGNOSIS — Z87448 Personal history of other diseases of urinary system: Secondary | ICD-10-CM | POA: Insufficient documentation

## 2013-05-17 DIAGNOSIS — Z8679 Personal history of other diseases of the circulatory system: Secondary | ICD-10-CM | POA: Insufficient documentation

## 2013-05-17 DIAGNOSIS — E119 Type 2 diabetes mellitus without complications: Secondary | ICD-10-CM | POA: Insufficient documentation

## 2013-05-17 DIAGNOSIS — I10 Essential (primary) hypertension: Secondary | ICD-10-CM | POA: Insufficient documentation

## 2013-05-17 DIAGNOSIS — Z8619 Personal history of other infectious and parasitic diseases: Secondary | ICD-10-CM | POA: Insufficient documentation

## 2013-05-17 DIAGNOSIS — Z8719 Personal history of other diseases of the digestive system: Secondary | ICD-10-CM | POA: Insufficient documentation

## 2013-05-17 DIAGNOSIS — J45909 Unspecified asthma, uncomplicated: Secondary | ICD-10-CM | POA: Insufficient documentation

## 2013-05-17 DIAGNOSIS — Z8639 Personal history of other endocrine, nutritional and metabolic disease: Secondary | ICD-10-CM | POA: Insufficient documentation

## 2013-05-17 DIAGNOSIS — Z794 Long term (current) use of insulin: Secondary | ICD-10-CM | POA: Insufficient documentation

## 2013-05-17 DIAGNOSIS — Z8742 Personal history of other diseases of the female genital tract: Secondary | ICD-10-CM | POA: Insufficient documentation

## 2013-05-17 DIAGNOSIS — Z8669 Personal history of other diseases of the nervous system and sense organs: Secondary | ICD-10-CM | POA: Insufficient documentation

## 2013-05-17 DIAGNOSIS — Z86711 Personal history of pulmonary embolism: Secondary | ICD-10-CM | POA: Insufficient documentation

## 2013-05-17 DIAGNOSIS — R259 Unspecified abnormal involuntary movements: Secondary | ICD-10-CM | POA: Insufficient documentation

## 2013-05-17 DIAGNOSIS — IMO0002 Reserved for concepts with insufficient information to code with codable children: Secondary | ICD-10-CM | POA: Insufficient documentation

## 2013-05-17 DIAGNOSIS — G40909 Epilepsy, unspecified, not intractable, without status epilepticus: Secondary | ICD-10-CM | POA: Insufficient documentation

## 2013-05-17 DIAGNOSIS — F1010 Alcohol abuse, uncomplicated: Secondary | ICD-10-CM | POA: Insufficient documentation

## 2013-05-17 DIAGNOSIS — Z91040 Latex allergy status: Secondary | ICD-10-CM | POA: Insufficient documentation

## 2013-05-17 DIAGNOSIS — Z87440 Personal history of urinary (tract) infections: Secondary | ICD-10-CM | POA: Insufficient documentation

## 2013-05-17 DIAGNOSIS — F10929 Alcohol use, unspecified with intoxication, unspecified: Secondary | ICD-10-CM

## 2013-05-17 DIAGNOSIS — Z862 Personal history of diseases of the blood and blood-forming organs and certain disorders involving the immune mechanism: Secondary | ICD-10-CM | POA: Insufficient documentation

## 2013-05-17 DIAGNOSIS — Z8659 Personal history of other mental and behavioral disorders: Secondary | ICD-10-CM | POA: Insufficient documentation

## 2013-05-17 LAB — CBC WITH DIFFERENTIAL/PLATELET
Basophils Absolute: 0 10*3/uL (ref 0.0–0.1)
Basophils Relative: 0 % (ref 0–1)
Eosinophils Absolute: 0 10*3/uL (ref 0.0–0.7)
MCH: 25.3 pg — ABNORMAL LOW (ref 26.0–34.0)
MCHC: 33 g/dL (ref 30.0–36.0)
Neutrophils Relative %: 61 % (ref 43–77)
Platelets: 309 10*3/uL (ref 150–400)
RDW: 14.7 % (ref 11.5–15.5)

## 2013-05-17 LAB — URINALYSIS, ROUTINE W REFLEX MICROSCOPIC
Glucose, UA: >1000 mg/dL — AB
Ketones, ur: NEGATIVE mg/dL
Leukocytes, UA: NEGATIVE
pH: 5 (ref 5.0–8.0)

## 2013-05-17 LAB — ETHANOL: Alcohol, Ethyl (B): 138 mg/dL — ABNORMAL HIGH (ref 0–11)

## 2013-05-17 LAB — RAPID URINE DRUG SCREEN, HOSP PERFORMED
Barbiturates: NOT DETECTED
Benzodiazepines: POSITIVE — AB
Cocaine: NOT DETECTED

## 2013-05-17 LAB — COMPREHENSIVE METABOLIC PANEL
Albumin: 3.3 g/dL — ABNORMAL LOW (ref 3.5–5.2)
Alkaline Phosphatase: 57 U/L (ref 39–117)
BUN: 8 mg/dL (ref 6–23)
Potassium: 3 mEq/L — ABNORMAL LOW (ref 3.5–5.1)
Sodium: 132 mEq/L — ABNORMAL LOW (ref 135–145)
Total Protein: 7.2 g/dL (ref 6.0–8.3)

## 2013-05-17 MED ORDER — SODIUM CHLORIDE 0.9 % IV BOLUS (SEPSIS)
1000.0000 mL | Freq: Once | INTRAVENOUS | Status: AC
  Administered 2013-05-17: 1000 mL via INTRAVENOUS

## 2013-05-17 MED ORDER — LORAZEPAM 2 MG/ML IJ SOLN
INTRAMUSCULAR | Status: AC
  Administered 2013-05-17: 1 mg
  Filled 2013-05-17: qty 1

## 2013-05-17 MED ORDER — LORAZEPAM 2 MG/ML IJ SOLN: 1.0000 mg | Freq: Once | INTRAMUSCULAR | Status: DC

## 2013-05-17 NOTE — ED Notes (Signed)
Makayla Frazier [phone]

## 2013-05-17 NOTE — ED Notes (Signed)
AVW:[id number]<[id number]> Expected date:<BR> Expected time:<BR> Means of arrival:<BR> Comments:<BR> EMS, 38 F, Seizures

## 2013-05-17 NOTE — ED Notes (Signed)
Attempted two calls to Makayla Frazier at phone number he had wrote down to be called at for pt pickup at time of discharge

## 2013-05-17 NOTE — ED Notes (Signed)
Pt returned from CT °

## 2013-05-17 NOTE — ED Provider Notes (Signed)
History     CSN: [id number]  Arrival date & time 05/17/13  0100   First MD Initiated Contact with Patient 05/17/13 0114      Chief Complaint  Patient presents with  . Seizures   level V caviar applied secondary to altered mental status.  HPI  History provided by EMS report. Patient is a 42 year old female with past history of hypertension, hyperlipidemia, diabetes, PE, syncopal episodes and major depression who presents with reports of seizures. EMS was called to the patient's friend's house where she was reported to have 4-6 episodes of seizure-like activity with loss of consciousness and shaking and tremors of extremities. EMS report the patient seemed confused with possible postictal state. They did administer 5 mg of madazalom IV. EMS did report that patient has strong smell of EtOH. There was no reports of other drug use. Patient has no prior history of seizures. Patient currently unable to provide any additional history.     Past Medical History  Diagnosis Date  . PE (pulmonary embolism)     2009 - on oral contraception; multiple  . Syncopal episodes     unknown etiology  . Dysfunctional uterine bleeding     Seen by Dr. Pennie Rushing, GYN; pending hysterectomy as of 07/2012  . Asthma   . Obesity   . Mastodynia   . Hypertension   . Post - coital bleeding 2012  . Labial lesion 2013  . BV (bacterial vaginosis) 2012  . Oligomenorrhea 2011  . DVT (deep venous thrombosis) 2011    pt had IUD; also 05/2012  . H/O hypercoagulable state     2/2 contraception  . HLD (hyperlipidemia)   . DM (diabetes mellitus)     diagnosed 2009  . Headache(784.0)   . Anemia   . Herpes simplex without mention of complication 06/2011    HSV-2  . Thyroid disease     hypothyroidism  . Vitamin D deficiency   . Major depression     Hx suicide attempt in 2009, [facility] admissions  . Venous insufficiency   . Sleep apnea   . Neuropathy     Past Surgical History  Procedure Laterality Date  . Breast  reduction surgery      Dr. Kathie Dike [facility] 2011  . Cholecystectomy    . Cardiac electrophysiology study & dft      Implantable Loop recorder  . Endometrial biopsy  2011  . Dilatation & currettage/hysteroscopy with resectocope  2007 & 2013  . Laparoscopic assisted vaginal hysterectomy  12/18/2012    Procedure: LAPAROSCOPIC ASSISTED VAGINAL HYSTERECTOMY;  Surgeon: Hal Morales, MD;  Location: [facility];  Service: Gynecology;  Laterality: N/A;  . Iud removal  12/18/2012    Procedure: INTRAUTERINE DEVICE (IUD) REMOVAL;  Surgeon: Hal Morales, MD;  Location: [facility];  Service: Gynecology;  Laterality: N/A;  Removed during prep by E. Lowell Guitar PA  . Bilateral salpingectomy  12/18/2012    Procedure: BILATERAL SALPINGECTOMY;  Surgeon: Hal Morales, MD;  Location: [facility];  Service: Gynecology;  Laterality: Bilateral;    Family History  Problem Relation Age of Onset  . Melanoma Father 58  . Ulcerative colitis Mother 29  . Breast cancer Paternal Grandmother   . Diabetes type II Maternal Grandmother     deceased 39  . Pulmonary embolism Paternal Grandfather     History  Substance Use Topics  . Smoking status: Never Smoker   . Smokeless tobacco: Never Used  . Alcohol Use: No    OB  History   Grav Para Term Preterm Abortions TAB SAB Ect Mult Living   0 0              Review of Systems  Unable to perform ROS: Mental status change    Allergies  Codeine; Darvocet; Hydrocodone-acetaminophen; Latex; Oxycodone-acetaminophen; and Percocet  Home Medications   Current Outpatient Rx  Name  Route  Sig  Dispense  Refill  . albuterol (PROVENTIL,VENTOLIN) 90 MCG/ACT inhaler   Inhalation   Inhale 1-2 puffs into the lungs every 4 (four) hours as needed for shortness of breath.          . beclomethasone (QVAR) 80 MCG/ACT inhaler   Inhalation   Inhale 1 puff into the lungs 2 (two) times daily as needed (for shortness of breath.).         Marland Kitchen cetirizine (ZYRTEC) 10 MG tablet   Oral   Take  10 mg by mouth daily.         . insulin aspart (NOVOLOG) 100 UNIT/ML injection   Subcutaneous   Inject 9 Units into the skin 3 (three) times daily before meals.          . insulin glargine (LANTUS) 100 UNIT/ML injection   Subcutaneous   Inject 65 Units into the skin at bedtime.         Marland Kitchen lisinopril (PRINIVIL,ZESTRIL) 10 MG tablet   Oral   Take 20 mg by mouth daily.         . metroNIDAZOLE (FLAGYL) 500 MG tablet   Oral   Take 500 mg by mouth daily. 12-week prophylaxis starting 02/04/13.         Marland Kitchen metroNIDAZOLE (METROGEL) 0.75 % vaginal gel   Vaginal   Place 1 Applicatorful vaginally at bedtime. 5 day course of therapy started 01/30/13.         . Rivaroxaban (XARELTO) 20 MG TABS   Oral   Take 20 mg by mouth at bedtime.         . traMADol (ULTRAM) 50 MG tablet   Oral   Take 50-100 mg by mouth every 6 (six) hours as needed for pain.           LMP 11/06/2012  Physical Exam  Nursing note and vitals reviewed. Constitutional: She appears well-developed and well-nourished. No distress.  HENT:  Head: Normocephalic.  Small amount of dry blood in bilateral nostrils. No active bleeding. No bite marks to the tongue. No bleeding in the mouth. Dentition appears at baseline without acute injury or change.  Eyes: Pupils are equal, round, and reactive to light. Right conjunctiva is injected. Left conjunctiva is injected.  Cardiovascular: Normal rate and regular rhythm.   Pulmonary/Chest: Effort normal and breath sounds normal.  Abdominal: Soft.  Neurological: She is alert. GCS eye subscore is 4. GCS verbal subscore is 3. GCS motor subscore is 6.  Skin: Skin is warm and dry. No rash noted.  Psychiatric: She has a normal mood and affect. Her behavior is normal.    ED Course  Procedures  Results for orders placed during the hospital encounter of 05/17/13  CBC WITH DIFFERENTIAL      Result Value Range   WBC 9.8  4.0 - 10.5 K/uL   RBC 4.91  3.87 - 5.11 MIL/uL    Hemoglobin 12.4  12.0 - 15.0 g/dL   HCT 16.1  09.6 - 04.5 %   MCV 76.6 (*) 78.0 - 100.0 fL   MCH 25.3 (*) 26.0 - 34.0 pg  MCHC 33.0  30.0 - 36.0 g/dL   RDW 14.7  82.9 - 56.2 %   Platelets 309  150 - 400 K/uL   Neutrophils Relative % 61  43 - 77 %   Neutro Abs 6.0  1.7 - 7.7 K/uL   Lymphocytes Relative 31  12 - 46 %   Lymphs Abs 3.0  0.7 - 4.0 K/uL   Monocytes Relative 8  3 - 12 %   Monocytes Absolute 0.7  0.1 - 1.0 K/uL   Eosinophils Relative 0  0 - 5 %   Eosinophils Absolute 0.0  0.0 - 0.7 K/uL   Basophils Relative 0  0 - 1 %   Basophils Absolute 0.0  0.0 - 0.1 K/uL  COMPREHENSIVE METABOLIC PANEL      Result Value Range   Sodium 132 (*) 135 - 145 mEq/L   Potassium 3.0 (*) 3.5 - 5.1 mEq/L   Chloride 95 (*) 96 - 112 mEq/L   CO2 19  19 - 32 mEq/L   Glucose, Bld 309 (*) 70 - 99 mg/dL   BUN 8  6 - 23 mg/dL   Creatinine, Ser 1.30  0.50 - 1.10 mg/dL   Calcium 8.9  8.4 - 86.5 mg/dL   Total Protein 7.2  6.0 - 8.3 g/dL   Albumin 3.3 (*) 3.5 - 5.2 g/dL   AST 18  0 - 37 U/L   ALT 17  0 - 35 U/L   Alkaline Phosphatase 57  39 - 117 U/L   Total Bilirubin 0.2 (*) 0.3 - 1.2 mg/dL   GFR calc non Af Amer >90  >90 mL/min   GFR calc Af Amer >90  >90 mL/min  URINALYSIS, ROUTINE W REFLEX MICROSCOPIC      Result Value Range   Color, Urine YELLOW  YELLOW   APPearance CLEAR  CLEAR   Specific Gravity, Urine 1.021  1.005 - 1.030   pH 5.0  5.0 - 8.0   Glucose, UA >1000 (*) NEGATIVE mg/dL   Hgb urine dipstick NEGATIVE  NEGATIVE   Bilirubin Urine NEGATIVE  NEGATIVE   Ketones, ur NEGATIVE  NEGATIVE mg/dL   Protein, ur NEGATIVE  NEGATIVE mg/dL   Urobilinogen, UA 0.2  0.0 - 1.0 mg/dL   Nitrite NEGATIVE  NEGATIVE   Leukocytes, UA NEGATIVE  NEGATIVE  ETHANOL      Result Value Range   Alcohol, Ethyl (B) 138 (*) 0 - 11 mg/dL  URINE RAPID DRUG SCREEN (HOSP PERFORMED)      Result Value Range   Opiates NONE DETECTED  NONE DETECTED   Cocaine NONE DETECTED  NONE DETECTED   Benzodiazepines POSITIVE (*)  NONE DETECTED   Amphetamines NONE DETECTED  NONE DETECTED   Tetrahydrocannabinol NONE DETECTED  NONE DETECTED   Barbiturates NONE DETECTED  NONE DETECTED  LACTIC ACID, PLASMA      Result Value Range   Lactic Acid, Venous 4.7 (*) 0.5 - 2.2 mmol/L  URINE MICROSCOPIC-ADD ON      Result Value Range   Urine-Other MANY YEAST          Ct Head Wo Contrast  05/17/2013   *RADIOLOGY REPORT*  Clinical Data:  Multiple seizures; concern for head or facial injury.  CT HEAD WITHOUT CONTRAST CT MAXILLOFACIAL WITHOUT CONTRAST  Technique:  Multidetector CT imaging of the head and maxillofacial structures were performed using the standard protocol without intravenous contrast. Multiplanar CT image reconstructions of the maxillofacial structures were also generated.  Comparison:  CT of the head  and MRI of the brain performed 02/03/2013  CT HEAD  Findings: There is no evidence of acute infarction, mass lesion, or intra- or extra-axial hemorrhage on CT.  Prominence of the sulci reflects mild cortical volume loss.  The posterior fossa, including the cerebellum, brainstem and fourth ventricle, is within normal limits.  The third and lateral ventricles, and basal ganglia are unremarkable in appearance.  The cerebral hemispheres are symmetric in appearance, with normal gray- white differentiation.  No mass effect or midline shift is seen.  There is no evidence of fracture; visualized osseous structures are unremarkable in appearance.  The orbits are within normal limits. A mucus retention cyst or polyp is noted within the right frontal sinus; the remaining paranasal sinuses and mastoid air cells are well-aerated.  No significant soft tissue abnormalities are seen.  IMPRESSION:  1.  No evidence of traumatic intracranial injury or fracture. 2.  Mild cortical volume loss noted. 3.  Mucus retention cyst or polyp in the right frontal sinus.  CT MAXILLOFACIAL  Findings:   There is no evidence of fracture or dislocation.  The maxilla  and mandible appear intact.  The nasal bone is unremarkable in appearance.  The visualized dentition demonstrates no acute abnormality.  The orbits are intact bilaterally.  A mucus retention cyst or polyp is noted within the right frontal sinus; the remaining visualized paranasal sinuses and mastoid air cells are well-aerated.  No significant soft tissue abnormalities are seen.  The parapharyngeal fat planes are preserved.  The nasopharynx, oropharynx and hypopharynx are unremarkable in appearance.  The visualized portions of the valleculae and piriform sinuses are grossly unremarkable.  The parotid and submandibular glands are within normal limits.  No cervical lymphadenopathy is seen.  IMPRESSION:  1.  No evidence of fracture or dislocation. 2.  Mucus retention cyst or polyp within the right frontal sinus.   Original Report Authenticated By: Tonia Ghent, M.D.   Ct Maxillofacial Wo Cm  05/17/2013   *RADIOLOGY REPORT*  Clinical Data:  Multiple seizures; concern for head or facial injury.  CT HEAD WITHOUT CONTRAST CT MAXILLOFACIAL WITHOUT CONTRAST  Technique:  Multidetector CT imaging of the head and maxillofacial structures were performed using the standard protocol without intravenous contrast. Multiplanar CT image reconstructions of the maxillofacial structures were also generated.  Comparison:  CT of the head and MRI of the brain performed 02/03/2013  CT HEAD  Findings: There is no evidence of acute infarction, mass lesion, or intra- or extra-axial hemorrhage on CT.  Prominence of the sulci reflects mild cortical volume loss.  The posterior fossa, including the cerebellum, brainstem and fourth ventricle, is within normal limits.  The third and lateral ventricles, and basal ganglia are unremarkable in appearance.  The cerebral hemispheres are symmetric in appearance, with normal gray- white differentiation.  No mass effect or midline shift is seen.  There is no evidence of fracture; visualized osseous  structures are unremarkable in appearance.  The orbits are within normal limits. A mucus retention cyst or polyp is noted within the right frontal sinus; the remaining paranasal sinuses and mastoid air cells are well-aerated.  No significant soft tissue abnormalities are seen.  IMPRESSION:  1.  No evidence of traumatic intracranial injury or fracture. 2.  Mild cortical volume loss noted. 3.  Mucus retention cyst or polyp in the right frontal sinus.  CT MAXILLOFACIAL  Findings:   There is no evidence of fracture or dislocation.  The maxilla and mandible appear intact.  The nasal bone is  unremarkable in appearance.  The visualized dentition demonstrates no acute abnormality.  The orbits are intact bilaterally.  A mucus retention cyst or polyp is noted within the right frontal sinus; the remaining visualized paranasal sinuses and mastoid air cells are well-aerated.  No significant soft tissue abnormalities are seen.  The parapharyngeal fat planes are preserved.  The nasopharynx, oropharynx and hypopharynx are unremarkable in appearance.  The visualized portions of the valleculae and piriform sinuses are grossly unremarkable.  The parotid and submandibular glands are within normal limits.  No cervical lymphadenopathy is seen.  IMPRESSION:  1.  No evidence of fracture or dislocation. 2.  Mucus retention cyst or polyp within the right frontal sinus.   Original Report Authenticated By: Tonia Ghent, M.D.     1. Seizure   2. Alcohol intoxication, with unspecified complication       MDM  1:15AM patient seen and evaluated. Patient is awake. She does respond to questioning with shaking her head. Does not speak.   1:35AM I was notified by a nurse the patient having seizure-like activity. Upon entering the room patient with tonic-clonic movements of extremities. Heart rate is elevated. She did seem to respond and improve to ammonia tab. 1 mg Ativan given IV.  Patient continued to be monitored through the night.  No additional seizure-like activity. Labs are significant for elevated alcohol level. Patient does have elevated lactate. Blood sugar also elevated. She is receiving IV fluids.  Patient was also seen and evaluated with attending physician. Patient with no prior history of seizure activity. Does have past history of syncopal-type episodes for which she wears a heart monitor and has been evaluated for. At this time patient has sobered and is felt stable for discharge home with seizure precautions. She has been advised not to drive a vehicle and to avoid any hazardous situation should she have any additional seizures. Neurology referral provided.  Angus Seller, PA-C 05/17/13 [phone]

## 2013-05-17 NOTE — ED Notes (Signed)
Attempted two calls to [facility] with no answer. Three attempts made to call patients mother with permission from pt from numbers provided by pt with no answer.

## 2013-05-17 NOTE — ED Notes (Signed)
Pt had 4 witnessed seizures prior to arrival,  Lasting 30 to 45 seconds,  Pt has IV and was given 5 mg of madazalom IV by EMS,  Pt smells of ETOH

## 2013-05-18 NOTE — ED Provider Notes (Signed)
Medical screening examination/treatment/procedure(s) were conducted as a shared visit with non-physician practitioner(s) and myself.  I personally evaluated the patient during the encounter.  Pt with shaking and seizure like activity episode during period of emotional lability, etoh.  Will refer to neurology  Olivia Mackie, MD 05/18/13 [phone]

## 2013-05-30 ENCOUNTER — Institutional Professional Consult (permissible substitution): Payer: Self-pay | Admitting: Neurology

## 2013-06-05 ENCOUNTER — Encounter: Payer: Self-pay | Admitting: *Deleted

## 2013-06-19 ENCOUNTER — Other Ambulatory Visit: Payer: Self-pay

## 2013-06-25 ENCOUNTER — Encounter: Payer: Self-pay | Admitting: Internal Medicine

## 2013-06-25 ENCOUNTER — Encounter: Payer: Self-pay | Admitting: *Deleted

## 2013-06-25 ENCOUNTER — Ambulatory Visit (INDEPENDENT_AMBULATORY_CARE_PROVIDER_SITE_OTHER): Payer: Medicare Other | Admitting: Internal Medicine

## 2013-06-25 VITALS — BP 128/94 | HR 74 | Ht 60.0 in | Wt 169.2 lb

## 2013-06-25 DIAGNOSIS — R55 Syncope and collapse: Secondary | ICD-10-CM

## 2013-06-25 LAB — CBC WITH DIFFERENTIAL/PLATELET
Basophils Absolute: 0.1 10*3/uL (ref 0.0–0.1)
Eosinophils Absolute: 0.1 10*3/uL (ref 0.0–0.7)
HCT: 41.7 % (ref 36.0–46.0)
Hemoglobin: 13.5 g/dL (ref 12.0–15.0)
Lymphs Abs: 4.8 10*3/uL — ABNORMAL HIGH (ref 0.7–4.0)
MCHC: 32.5 g/dL (ref 30.0–36.0)
MCV: 80.6 fl (ref 78.0–100.0)
Neutro Abs: 8.6 10*3/uL — ABNORMAL HIGH (ref 1.4–7.7)
RDW: 15.4 % — ABNORMAL HIGH (ref 11.5–14.6)

## 2013-06-25 LAB — BASIC METABOLIC PANEL
CO2: 25 mEq/L (ref 19–32)
Chloride: 104 mEq/L (ref 96–112)
Glucose, Bld: 134 mg/dL — ABNORMAL HIGH (ref 70–99)
Potassium: 3.8 mEq/L (ref 3.5–5.1)
Sodium: 137 mEq/L (ref 135–145)

## 2013-06-25 NOTE — Progress Notes (Signed)
PCP: Windy Carina, PA-C   Makayla Frazier is a 42 y.o. female who presents today for routine electrophysiology followup.  Since last being seen in our clinic, the patient reports doing very well.  She has had no further syncopal spells.  She did have "seizures" and was evaluated in the ER last month.  At that time, her ETOH level was elevated.  Loop recorder interrogation today does not reveal any arrhythmias from that time frame.  Her loop recorder is now at end of service.    Today, she denies symptoms of palpitations, exertional chest pain, shortness of breath,  lower extremity edema, dizziness, presyncope, or syncope.  The patient is otherwise without complaint today.   Past Medical History  Diagnosis Date  . PE (pulmonary embolism)     2009 - on oral contraception; multiple  . Syncopal episodes     unknown etiology  . Dysfunctional uterine bleeding     Seen by Dr. Pennie Rushing, GYN; pending hysterectomy as of 07/2012  . Asthma   . Obesity   . Mastodynia   . Hypertension   . Post - coital bleeding 2012  . Labial lesion 2013  . BV (bacterial vaginosis) 2012  . Oligomenorrhea 2011  . DVT (deep venous thrombosis) 2011    pt had IUD; also 05/2012  . H/O hypercoagulable state     2/2 contraception  . HLD (hyperlipidemia)   . DM (diabetes mellitus)     diagnosed 2009  . Headache(784.0)   . Anemia   . Herpes simplex without mention of complication 06/2011    HSV-2  . Thyroid disease     hypothyroidism  . Vitamin D deficiency   . Major depression     Hx suicide attempt in 2009, [facility] admissions  . Venous insufficiency   . Sleep apnea   . Neuropathy    Past Surgical History  Procedure Laterality Date  . Breast reduction surgery      Dr. Kathie Dike [facility] 2011  . Cholecystectomy    . Cardiac electrophysiology study & dft      Implantable Loop recorder  . Endometrial biopsy  2011  . Dilatation & currettage/hysteroscopy with resectocope  2007 & 2013  . Laparoscopic assisted vaginal  hysterectomy  12/18/2012    Procedure: LAPAROSCOPIC ASSISTED VAGINAL HYSTERECTOMY;  Surgeon: Hal Morales, MD;  Location: [facility];  Service: Gynecology;  Laterality: N/A;  . Iud removal  12/18/2012    Procedure: INTRAUTERINE DEVICE (IUD) REMOVAL;  Surgeon: Hal Morales, MD;  Location: [facility];  Service: Gynecology;  Laterality: N/A;  Removed during prep by E. Lowell Guitar PA  . Bilateral salpingectomy  12/18/2012    Procedure: BILATERAL SALPINGECTOMY;  Surgeon: Hal Morales, MD;  Location: [facility];  Service: Gynecology;  Laterality: Bilateral;    Current Outpatient Prescriptions  Medication Sig Dispense Refill  . albuterol (PROVENTIL,VENTOLIN) 90 MCG/ACT inhaler Inhale 1-2 puffs into the lungs every 4 (four) hours as needed for shortness of breath.       . baclofen (LIORESAL) 10 MG tablet 10 mg as needed.      . beclomethasone (QVAR) 80 MCG/ACT inhaler Inhale 1 puff into the lungs 2 (two) times daily as needed (for shortness of breath.).      Marland Kitchen insulin aspart (NOVOLOG) 100 UNIT/ML injection Inject 9 Units into the skin 3 (three) times daily before meals.       . insulin detemir (LEVEMIR) 100 UNIT/ML injection Inject 30 Units into the skin 2 (two) times  daily.      . INVOKANA 300 MG TABS 300 mg daily.      Marland Kitchen lisinopril (PRINIVIL,ZESTRIL) 10 MG tablet Take 20 mg by mouth every morning.       . metroNIDAZOLE (FLAGYL) 500 MG tablet Take 500 mg by mouth every morning. 12-week prophylaxis starting 02/04/13.      . Rivaroxaban (XARELTO) 20 MG TABS Take 20 mg by mouth at bedtime.      . topiramate (TOPAMAX) 25 MG tablet Take 25 mg by mouth daily.      . traMADol (ULTRAM) 50 MG tablet Take 50-100 mg by mouth every 6 (six) hours as needed for pain.       No current facility-administered medications for this visit.    Physical Exam: Filed Vitals:   06/25/13 1009  BP: 128/94  Pulse: 74  Height: 5' (1.524 m)  Weight: 169 lb 3.2 oz (76.749 kg)    GEN- The patient is well appearing, alert and  oriented x 3 today.   Head- normocephalic, atraumatic Eyes-  Sclera clear, conjunctiva pink Ears- hearing intact Oropharynx- clear Lungs- Clear to ausculation bilaterally, normal work of breathing Chest- ILR pocket is well healed with a keloid formed Heart- Regular rate and rhythm, no murmurs, rubs or gallops, PMI not laterally displaced GI- soft, NT, ND, + BS Extremities- no clubbing, cyanosis, or edema  Pacemaker interrogation- reviewed in detail today,  See PACEART report  Assessment and Plan:  1. Recurrent syncope Not cardiac in etiology She has had an implantable loop recorder for 3.5 years with multiple episodes of syncope with no arrhythmias documented. Recently she had seizures in the setting of ETOH intoxication. No further EP workup is planned. Her ILR is not at end of service. Risks, benefits, and alternatives to implantable loop recorder removal were discussed at length with the patient who wishes to proceed.  We will schedule this procedure at the next available time.

## 2013-06-25 NOTE — Patient Instructions (Addendum)
REMOVAL OF LOOP RECORDER

## 2013-06-26 ENCOUNTER — Other Ambulatory Visit: Payer: Self-pay | Admitting: *Deleted

## 2013-06-26 ENCOUNTER — Telehealth: Payer: Self-pay | Admitting: Internal Medicine

## 2013-06-26 DIAGNOSIS — Z4509 Encounter for adjustment and management of other cardiac device: Secondary | ICD-10-CM

## 2013-06-26 DIAGNOSIS — R55 Syncope and collapse: Secondary | ICD-10-CM

## 2013-06-26 NOTE — Telephone Encounter (Signed)
Changed to 07/17/13 Patient aware of time

## 2013-06-26 NOTE — Telephone Encounter (Signed)
New Prob   Pt states she cannot do procedure on July 30 because she will be out of town at her family reunion. She did not realize it was the same day until she got home. She asked if it could be rescheduled.

## 2013-06-27 ENCOUNTER — Encounter: Payer: Self-pay | Admitting: Internal Medicine

## 2013-07-10 ENCOUNTER — Encounter (HOSPITAL_COMMUNITY): Payer: Self-pay | Admitting: Pharmacy Technician

## 2013-07-16 MED ORDER — CEFAZOLIN SODIUM-DEXTROSE 2-3 GM-% IV SOLR
2.0000 g | INTRAVENOUS | Status: DC
  Filled 2013-07-16: qty 50

## 2013-07-16 MED ORDER — CHLORHEXIDINE GLUCONATE 4 % EX LIQD
60.0000 mL | Freq: Once | CUTANEOUS | Status: DC
  Filled 2013-07-16: qty 60

## 2013-07-16 MED ORDER — SODIUM CHLORIDE 0.9 % IR SOLN
80.0000 mg | Status: AC
  Filled 2013-07-16: qty 2

## 2013-07-17 ENCOUNTER — Encounter (HOSPITAL_COMMUNITY)
Admission: RE | Disposition: A | Discharge status: Home / Self Care | Payer: Self-pay | Source: Ambulatory Visit | Attending: Internal Medicine

## 2013-07-17 ENCOUNTER — Ambulatory Visit (HOSPITAL_COMMUNITY)
Admission: RE | Admit: 2013-07-17 | Discharge: 2013-07-17 | Disposition: A | Discharge status: Home / Self Care | Payer: Medicare Other | Source: Ambulatory Visit | Attending: Internal Medicine | Admitting: Internal Medicine

## 2013-07-17 DIAGNOSIS — Z86711 Personal history of pulmonary embolism: Secondary | ICD-10-CM | POA: Insufficient documentation

## 2013-07-17 DIAGNOSIS — G4730 Sleep apnea, unspecified: Secondary | ICD-10-CM | POA: Insufficient documentation

## 2013-07-17 DIAGNOSIS — F329 Major depressive disorder, single episode, unspecified: Secondary | ICD-10-CM | POA: Insufficient documentation

## 2013-07-17 DIAGNOSIS — Z4509 Encounter for adjustment and management of other cardiac device: Secondary | ICD-10-CM

## 2013-07-17 DIAGNOSIS — E785 Hyperlipidemia, unspecified: Secondary | ICD-10-CM | POA: Insufficient documentation

## 2013-07-17 DIAGNOSIS — R55 Syncope and collapse: Secondary | ICD-10-CM

## 2013-07-17 DIAGNOSIS — D649 Anemia, unspecified: Secondary | ICD-10-CM | POA: Insufficient documentation

## 2013-07-17 DIAGNOSIS — E119 Type 2 diabetes mellitus without complications: Secondary | ICD-10-CM | POA: Insufficient documentation

## 2013-07-17 DIAGNOSIS — F1010 Alcohol abuse, uncomplicated: Secondary | ICD-10-CM | POA: Insufficient documentation

## 2013-07-17 DIAGNOSIS — Z7901 Long term (current) use of anticoagulants: Secondary | ICD-10-CM | POA: Insufficient documentation

## 2013-07-17 DIAGNOSIS — I872 Venous insufficiency (chronic) (peripheral): Secondary | ICD-10-CM | POA: Insufficient documentation

## 2013-07-17 DIAGNOSIS — I10 Essential (primary) hypertension: Secondary | ICD-10-CM | POA: Insufficient documentation

## 2013-07-17 DIAGNOSIS — G589 Mononeuropathy, unspecified: Secondary | ICD-10-CM | POA: Insufficient documentation

## 2013-07-17 DIAGNOSIS — Z6833 Body mass index (BMI) 33.0-33.9, adult: Secondary | ICD-10-CM | POA: Insufficient documentation

## 2013-07-17 DIAGNOSIS — Z794 Long term (current) use of insulin: Secondary | ICD-10-CM | POA: Insufficient documentation

## 2013-07-17 DIAGNOSIS — Z79899 Other long term (current) drug therapy: Secondary | ICD-10-CM | POA: Insufficient documentation

## 2013-07-17 DIAGNOSIS — E559 Vitamin D deficiency, unspecified: Secondary | ICD-10-CM | POA: Insufficient documentation

## 2013-07-17 DIAGNOSIS — E669 Obesity, unspecified: Secondary | ICD-10-CM | POA: Insufficient documentation

## 2013-07-17 DIAGNOSIS — E039 Hypothyroidism, unspecified: Secondary | ICD-10-CM | POA: Insufficient documentation

## 2013-07-17 DIAGNOSIS — IMO0002 Reserved for concepts with insufficient information to code with codable children: Secondary | ICD-10-CM | POA: Insufficient documentation

## 2013-07-17 DIAGNOSIS — J45909 Unspecified asthma, uncomplicated: Secondary | ICD-10-CM | POA: Insufficient documentation

## 2013-07-17 DIAGNOSIS — Z86718 Personal history of other venous thrombosis and embolism: Secondary | ICD-10-CM | POA: Insufficient documentation

## 2013-07-17 HISTORY — PX: LOOP RECORDER EXPLANT: SHX5476

## 2013-07-17 LAB — SURGICAL PCR SCREEN
MRSA, PCR: NEGATIVE
Staphylococcus aureus: NEGATIVE

## 2013-07-17 LAB — GLUCOSE, CAPILLARY: Glucose-Capillary: 138 mg/dL — ABNORMAL HIGH (ref 70–99)

## 2013-07-17 SURGERY — LOOP RECORDER EXPLANT
Anesthesia: LOCAL

## 2013-07-17 MED ORDER — DIAZEPAM 5 MG PO TABS
ORAL_TABLET | ORAL | Status: AC
  Filled 2013-07-17: qty 1

## 2013-07-17 MED ORDER — MIDAZOLAM HCL 2 MG/2ML IJ SOLN
INTRAMUSCULAR | Status: AC
  Filled 2013-07-17: qty 2

## 2013-07-17 MED ORDER — DIAZEPAM 5 MG PO TABS
5.0000 mg | ORAL_TABLET | Freq: Once | ORAL | Status: AC
  Administered 2013-07-17: 5 mg via ORAL

## 2013-07-17 MED ORDER — TRAMADOL HCL 50 MG PO TABS
50.0000 mg | ORAL_TABLET | ORAL | Status: AC
  Administered 2013-07-17: 50 mg via ORAL

## 2013-07-17 MED ORDER — ONDANSETRON HCL 4 MG/2ML IJ SOLN: 4.0000 mg | Freq: Four times a day (QID) | INTRAMUSCULAR | Status: DC | PRN

## 2013-07-17 MED ORDER — SODIUM CHLORIDE 0.9 % IV SOLN: 250.0000 mL | INTRAVENOUS | Status: DC | PRN

## 2013-07-17 MED ORDER — MUPIROCIN 2 % EX OINT
TOPICAL_OINTMENT | Freq: Two times a day (BID) | CUTANEOUS | Status: DC
  Administered 2013-07-17: 1 via NASAL
  Filled 2013-07-17: qty 22

## 2013-07-17 MED ORDER — FENTANYL CITRATE 0.05 MG/ML IJ SOLN
INTRAMUSCULAR | Status: AC
  Filled 2013-07-17: qty 2

## 2013-07-17 MED ORDER — MUPIROCIN 2 % EX OINT
TOPICAL_OINTMENT | CUTANEOUS | Status: AC
  Filled 2013-07-17: qty 22

## 2013-07-17 MED ORDER — SODIUM CHLORIDE 0.9 % IJ SOLN: 3.0000 mL | Freq: Two times a day (BID) | INTRAMUSCULAR | Status: DC

## 2013-07-17 MED ORDER — LIDOCAINE HCL (PF) 1 % IJ SOLN
INTRAMUSCULAR | Status: AC
  Filled 2013-07-17: qty 60

## 2013-07-17 MED ORDER — SODIUM CHLORIDE 0.9 % IJ SOLN: 3.0000 mL | INTRAMUSCULAR | Status: DC | PRN

## 2013-07-17 MED ORDER — LISINOPRIL 20 MG PO TABS
20.0000 mg | ORAL_TABLET | ORAL | Status: AC
  Administered 2013-07-17: 20 mg via ORAL
  Filled 2013-07-17: qty 1

## 2013-07-17 NOTE — Op Note (Signed)
SURGEON:  Hillis Range, MD     PREPROCEDURE DIAGNOSIS:  Recurrent noncardiac syncope    POSTPROCEDURE DIAGNOSIS:  Recurrent noncardiac Syncope     PROCEDURES:   1. Implantable loop recorder removal    INTRODUCTION:  Makayla Frazier is a 42 y.o. female with a history of recurrent unexplained syncope who presents today for implantable loop removal.  She underwent ILR placement by me in 2010.  She has had multiple episodes of syncope without any arrhythmias recorded from the device.  She has since been diagnosed with seizures.  Her ILR has reached EOS battery status.  The patient therefore presents today for implantable loop explantation.     DESCRIPTION OF PROCEDURE:  Informed written consent was obtained, and the patient was brought to the electrophysiology lab in a fasting state.  The patient was adequately sedated with intravenous versed and fentanyl as outlined in the nursing report for the procedure today.   The patients left chest was therefore prepped and draped in the usual sterile fashion by the EP lab staff. The skin overlying the existing loop recorder was infiltrated with lidocaine for local analgesia.  A 1.5-cm incision was made over the prior keloid scar.  The subcutaneous ILR pocket recovered using a combination of sharp and blunt dissection.  2 separate 2 silk sutures were identified and removed in their entirety.  There was no foreign matter or debrit within the pocket.   The device was removed.  Electrocautery was required to assure hemostasis.  The pocket was irrigated with copious gentamicin solution.  The pocket was then closed in 2 layers with 2.0 Vicryl suture for the subcutaneous and subcuticular layers.  Steri- Strips and a sterile dressing were then applied.  There were no early apparent complications.     CONCLUSIONS:   1. Successful explantation of a [employer]onic Reveal XT implantable loop recorder   2. No early apparent complications.        Hillis Range,  MD 07/17/2013 3:53 PM

## 2013-07-17 NOTE — Progress Notes (Signed)
Pt states"ready to go home"  VSS wnl,  pt in no acute distress up to bathroom, p.o. taken well

## 2013-07-17 NOTE — Progress Notes (Signed)
Pt with episode of syncope, placed on stretcher, rapid response called. Will continue to monitor

## 2013-07-17 NOTE — Progress Notes (Signed)
Pt c/o pain 4/10 at incision site, BP 165/111 Dr Johney Frame  Notified, give lisinopril 20mg  now, give ultram 50mg  now.  Will continue to monitor

## 2013-07-17 NOTE — Progress Notes (Signed)
Dr Johney Frame at bedside pt alert and oriented no acute distress, give lisinopril continue plans for D/C home if BP WNL.

## 2013-07-17 NOTE — H&P (View-Only) (Signed)
PCP: GRAY, ERIN J, PA-C   Makayla Frazier is a 41 y.o. female who presents today for routine electrophysiology followup.  Since last being seen in our clinic, the patient reports doing very well.  She has had no further syncopal spells.  She did have "seizures" and was evaluated in the ER last month.  At that time, her ETOH level was elevated.  Loop recorder interrogation today does not reveal any arrhythmias from that time frame.  Her loop recorder is now at end of service.    Today, she denies symptoms of palpitations, exertional chest pain, shortness of breath,  lower extremity edema, dizziness, presyncope, or syncope.  The patient is otherwise without complaint today.   Past Medical History  Diagnosis Date  . PE (pulmonary embolism)     2009 - on oral contraception; multiple  . Syncopal episodes     unknown etiology  . Dysfunctional uterine bleeding     Seen by Dr. Haygood, GYN; pending hysterectomy as of 07/2012  . Asthma   . Obesity   . Mastodynia   . Hypertension   . Post - coital bleeding 2012  . Labial lesion 2013  . BV (bacterial vaginosis) 2012  . Oligomenorrhea 2011  . DVT (deep venous thrombosis) 2011    pt had IUD; also 05/2012  . H/O hypercoagulable state     2/2 contraception  . HLD (hyperlipidemia)   . DM (diabetes mellitus)     diagnosed 2009  . Headache(784.0)   . Anemia   . Herpes simplex without mention of complication 06/2011    HSV-2  . Thyroid disease     hypothyroidism  . Vitamin D deficiency   . Major depression     Hx suicide attempt in 2009, [facility] admissions  . Venous insufficiency   . Sleep apnea   . Neuropathy    Past Surgical History  Procedure Laterality Date  . Breast reduction surgery      Dr. Defranzo [facility] 2011  . Cholecystectomy    . Cardiac electrophysiology study & dft      Implantable Loop recorder  . Endometrial biopsy  2011  . Dilatation & currettage/hysteroscopy with resectocope  2007 & 2013  . Laparoscopic assisted vaginal  hysterectomy  12/18/2012    Procedure: LAPAROSCOPIC ASSISTED VAGINAL HYSTERECTOMY;  Surgeon: Vanessa P Haygood, MD;  Location: [facility];  Service: Gynecology;  Laterality: N/A;  . Iud removal  12/18/2012    Procedure: INTRAUTERINE DEVICE (IUD) REMOVAL;  Surgeon: Vanessa P Haygood, MD;  Location: [facility];  Service: Gynecology;  Laterality: N/A;  Removed during prep by E. Powell PA  . Bilateral salpingectomy  12/18/2012    Procedure: BILATERAL SALPINGECTOMY;  Surgeon: Vanessa P Haygood, MD;  Location: [facility];  Service: Gynecology;  Laterality: Bilateral;    Current Outpatient Prescriptions  Medication Sig Dispense Refill  . albuterol (PROVENTIL,VENTOLIN) 90 MCG/ACT inhaler Inhale 1-2 puffs into the lungs every 4 (four) hours as needed for shortness of breath.       . baclofen (LIORESAL) 10 MG tablet 10 mg as needed.      . beclomethasone (QVAR) 80 MCG/ACT inhaler Inhale 1 puff into the lungs 2 (two) times daily as needed (for shortness of breath.).      . insulin aspart (NOVOLOG) 100 UNIT/ML injection Inject 9 Units into the skin 3 (three) times daily before meals.       . insulin detemir (LEVEMIR) 100 UNIT/ML injection Inject 30 Units into the skin 2 (two) times   daily.      . INVOKANA 300 MG TABS 300 mg daily.      . lisinopril (PRINIVIL,ZESTRIL) 10 MG tablet Take 20 mg by mouth every morning.       . metroNIDAZOLE (FLAGYL) 500 MG tablet Take 500 mg by mouth every morning. 12-week prophylaxis starting 02/04/13.      . Rivaroxaban (XARELTO) 20 MG TABS Take 20 mg by mouth at bedtime.      . topiramate (TOPAMAX) 25 MG tablet Take 25 mg by mouth daily.      . traMADol (ULTRAM) 50 MG tablet Take 50-100 mg by mouth every 6 (six) hours as needed for pain.       No current facility-administered medications for this visit.    Physical Exam: Filed Vitals:   06/25/13 1009  BP: 128/94  Pulse: 74  Height: 5' (1.524 m)  Weight: 169 lb 3.2 oz (76.749 kg)    GEN- The patient is well appearing, alert and  oriented x 3 today.   Head- normocephalic, atraumatic Eyes-  Sclera clear, conjunctiva pink Ears- hearing intact Oropharynx- clear Lungs- Clear to ausculation bilaterally, normal work of breathing Chest- ILR pocket is well healed with a keloid formed Heart- Regular rate and rhythm, no murmurs, rubs or gallops, PMI not laterally displaced GI- soft, NT, ND, + BS Extremities- no clubbing, cyanosis, or edema  Pacemaker interrogation- reviewed in detail today,  See PACEART report  Assessment and Plan:  1. Recurrent syncope Not cardiac in etiology She has had an implantable loop recorder for 3.5 years with multiple episodes of syncope with no arrhythmias documented. Recently she had seizures in the setting of ETOH intoxication. No further EP workup is planned. Her ILR is not at end of service. Risks, benefits, and alternatives to implantable loop recorder removal were discussed at length with the patient who wishes to proceed.  We will schedule this procedure at the next available time.  

## 2013-07-17 NOTE — Interval H&P Note (Signed)
History and Physical Interval Note:  07/17/2013 12:47 PM  Makayla Frazier  has presented today for surgery, with the diagnosis of syncope  The various methods of treatment have been discussed with the patient and family. After consideration of risks, benefits and other options for treatment, the patient has consented to  Procedure(s): LOOP RECORDER EXPLANT (N/A) as a surgical intervention .  The patient's history has been reviewed, patient examined, no change in status, stable for surgery.  I have reviewed the patient's chart and labs.  Questions were answered to the patient's satisfaction.     Hillis Range

## 2013-07-18 LAB — GLUCOSE, CAPILLARY: Glucose-Capillary: 104 mg/dL — ABNORMAL HIGH (ref 70–99)

## 2013-07-19 ENCOUNTER — Encounter (HOSPITAL_COMMUNITY): Payer: Self-pay | Admitting: *Deleted

## 2013-07-28 ENCOUNTER — Ambulatory Visit (INDEPENDENT_AMBULATORY_CARE_PROVIDER_SITE_OTHER): Payer: Medicare Other | Admitting: *Deleted

## 2013-07-28 DIAGNOSIS — T148XXD Other injury of unspecified body region, subsequent encounter: Secondary | ICD-10-CM

## 2013-07-28 DIAGNOSIS — T07XXXA Unspecified multiple injuries, initial encounter: Secondary | ICD-10-CM

## 2013-07-28 LAB — PACEMAKER DEVICE OBSERVATION

## 2013-07-28 NOTE — Progress Notes (Signed)
ILR explant wound check.  Steri-strips removed, wound not completely healed.  Pt's family states a recent seizure loosened steri-strips causing delay in healing. Applied new tegaderm.  ROV w/ device clinic for wound recheck 07/31/13 @ 5:00pm (pt knows she may show as early as 4:30pm).

## 2013-07-31 ENCOUNTER — Ambulatory Visit (INDEPENDENT_AMBULATORY_CARE_PROVIDER_SITE_OTHER): Payer: Medicare Other | Admitting: *Deleted

## 2013-07-31 DIAGNOSIS — T148XXD Other injury of unspecified body region, subsequent encounter: Secondary | ICD-10-CM

## 2013-07-31 DIAGNOSIS — T07XXXA Unspecified multiple injuries, initial encounter: Secondary | ICD-10-CM

## 2013-07-31 NOTE — Progress Notes (Signed)
Wound re-check after ILR explant. Wound has partially sealed better than appearance on 07/28/13. Per Dr. Ladona Ridgel, okay to shower and recommends hydrocortisone cream.

## 2013-08-16 ENCOUNTER — Encounter: Payer: Self-pay | Admitting: Internal Medicine

## 2013-09-23 ENCOUNTER — Encounter (HOSPITAL_COMMUNITY): Payer: Self-pay | Admitting: Emergency Medicine

## 2013-09-23 ENCOUNTER — Emergency Department (HOSPITAL_COMMUNITY): Payer: Medicare Other

## 2013-09-23 ENCOUNTER — Emergency Department (HOSPITAL_COMMUNITY)
Admission: EM | Admit: 2013-09-23 | Discharge: 2013-09-23 | Disposition: A | Discharge status: Home / Self Care | Payer: Medicare Other | Attending: Emergency Medicine | Admitting: Emergency Medicine

## 2013-09-23 DIAGNOSIS — Z79899 Other long term (current) drug therapy: Secondary | ICD-10-CM | POA: Insufficient documentation

## 2013-09-23 DIAGNOSIS — E669 Obesity, unspecified: Secondary | ICD-10-CM | POA: Insufficient documentation

## 2013-09-23 DIAGNOSIS — Z8742 Personal history of other diseases of the female genital tract: Secondary | ICD-10-CM | POA: Insufficient documentation

## 2013-09-23 DIAGNOSIS — R0789 Other chest pain: Secondary | ICD-10-CM

## 2013-09-23 DIAGNOSIS — R5381 Other malaise: Secondary | ICD-10-CM | POA: Insufficient documentation

## 2013-09-23 DIAGNOSIS — R071 Chest pain on breathing: Secondary | ICD-10-CM | POA: Insufficient documentation

## 2013-09-23 DIAGNOSIS — Z86711 Personal history of pulmonary embolism: Secondary | ICD-10-CM | POA: Insufficient documentation

## 2013-09-23 DIAGNOSIS — M549 Dorsalgia, unspecified: Secondary | ICD-10-CM | POA: Insufficient documentation

## 2013-09-23 DIAGNOSIS — F329 Major depressive disorder, single episode, unspecified: Secondary | ICD-10-CM | POA: Insufficient documentation

## 2013-09-23 DIAGNOSIS — J45909 Unspecified asthma, uncomplicated: Secondary | ICD-10-CM | POA: Insufficient documentation

## 2013-09-23 DIAGNOSIS — Z91040 Latex allergy status: Secondary | ICD-10-CM | POA: Insufficient documentation

## 2013-09-23 DIAGNOSIS — Z794 Long term (current) use of insulin: Secondary | ICD-10-CM | POA: Insufficient documentation

## 2013-09-23 DIAGNOSIS — Z86718 Personal history of other venous thrombosis and embolism: Secondary | ICD-10-CM | POA: Insufficient documentation

## 2013-09-23 DIAGNOSIS — Z862 Personal history of diseases of the blood and blood-forming organs and certain disorders involving the immune mechanism: Secondary | ICD-10-CM | POA: Insufficient documentation

## 2013-09-23 DIAGNOSIS — Z8669 Personal history of other diseases of the nervous system and sense organs: Secondary | ICD-10-CM | POA: Insufficient documentation

## 2013-09-23 DIAGNOSIS — Z8619 Personal history of other infectious and parasitic diseases: Secondary | ICD-10-CM | POA: Insufficient documentation

## 2013-09-23 DIAGNOSIS — E119 Type 2 diabetes mellitus without complications: Secondary | ICD-10-CM | POA: Insufficient documentation

## 2013-09-23 DIAGNOSIS — I10 Essential (primary) hypertension: Secondary | ICD-10-CM | POA: Insufficient documentation

## 2013-09-23 LAB — CBC
HCT: 41 % (ref 36.0–46.0)
Hemoglobin: 13.7 g/dL (ref 12.0–15.0)
MCHC: 33.4 g/dL (ref 30.0–36.0)
MCV: 76.9 fL — ABNORMAL LOW (ref 78.0–100.0)
RDW: 15.3 % (ref 11.5–15.5)

## 2013-09-23 LAB — BASIC METABOLIC PANEL
BUN: 10 mg/dL (ref 6–23)
Creatinine, Ser: 0.59 mg/dL (ref 0.50–1.10)
GFR calc Af Amer: >90 mL/min (ref >90)
GFR calc non Af Amer: >90 mL/min (ref >90)
Glucose, Bld: 223 mg/dL — ABNORMAL HIGH (ref 70–99)
Potassium: 3.8 mEq/L (ref 3.5–5.1)

## 2013-09-23 LAB — POCT I-STAT TROPONIN I: Troponin i, poc: 0 ng/mL (ref 0.00–0.08)

## 2013-09-23 MED ORDER — TRAMADOL HCL 50 MG PO TABS: 50.0000 mg | ORAL_TABLET | Freq: Four times a day (QID) | ORAL | Status: DC | PRN

## 2013-09-23 MED ORDER — IBUPROFEN 600 MG PO TABS: 600.0000 mg | ORAL_TABLET | Freq: Four times a day (QID) | ORAL | Status: DC | PRN

## 2013-09-23 MED ORDER — IOHEXOL 350 MG/ML SOLN
100.0000 mL | Freq: Once | INTRAVENOUS | Status: AC | PRN
  Administered 2013-09-23: 100 mL via INTRAVENOUS

## 2013-09-23 MED ORDER — KETOROLAC TROMETHAMINE 30 MG/ML IJ SOLN
30.0000 mg | Freq: Once | INTRAMUSCULAR | Status: AC
  Administered 2013-09-23: 30 mg via INTRAVENOUS
  Filled 2013-09-23: qty 1

## 2013-09-23 NOTE — ED Notes (Addendum)
Pt states she has had this pain in the past . She states it was a blood clot one time and one time it was inflammation. Pt states she has been  having sharp pain in her breast to her back since yesterday. Pt states when she bends over she feels it in her left arm.

## 2013-09-23 NOTE — ED Provider Notes (Signed)
CSN: [id number]     Arrival date & time 09/23/13  0141 History   First MD Initiated Contact with Patient 09/23/13 0229     Chief Complaint  Patient presents with  . Chest Pain   (Consider location/radiation/quality/duration/timing/severity/associated sxs/prior Treatment) HPI Patient with acute onset left lower chest pain just under her left breast radiating to back. Pain is worse with deep breathing and movement of the left arm. Patient denies shortness of breath or cough. He denies fever or chills. Patient denies lower extremity swelling or tenderness. No recent heavy lifting or chest trauma. Patient does have a history of PE in the past and is taking Xeralto. Patient denies missing any doses. She states the pain is similar to PE's she's had in the past. Past Medical History  Diagnosis Date  . PE (pulmonary embolism)     2009 - on oral contraception; multiple  . Syncopal episodes     unknown etiology  . Dysfunctional uterine bleeding     Seen by Dr. Pennie Rushing, GYN; pending hysterectomy as of 07/2012  . Asthma   . Obesity   . Mastodynia   . Hypertension   . Post - coital bleeding 2012  . Labial lesion 2013  . BV (bacterial vaginosis) 2012  . Oligomenorrhea 2011  . DVT (deep venous thrombosis) 2011    pt had IUD; also 05/2012  . H/O hypercoagulable state     2/2 contraception  . HLD (hyperlipidemia)   . DM (diabetes mellitus)     diagnosed 2009  . Headache(784.0)   . Anemia   . Herpes simplex without mention of complication 06/2011    HSV-2  . Thyroid disease     hypothyroidism  . Vitamin D deficiency   . Major depression     Hx suicide attempt in 2009, [facility] admissions  . Venous insufficiency   . Sleep apnea   . Neuropathy    Past Surgical History  Procedure Laterality Date  . Breast reduction surgery      Dr. Kathie Dike [facility] 2011  . Cholecystectomy    . Cardiac electrophysiology study & dft      Implantable Loop recorder; no arrhythmias associated with synocpal spells;  loop explanted 07-2013  . Endometrial biopsy  2011  . Dilatation & currettage/hysteroscopy with resectocope  2007 & 2013  . Laparoscopic assisted vaginal hysterectomy  12/18/2012    Procedure: LAPAROSCOPIC ASSISTED VAGINAL HYSTERECTOMY;  Surgeon: Hal Morales, MD;  Location: [facility];  Service: Gynecology;  Laterality: N/A;  . Iud removal  12/18/2012    Procedure: INTRAUTERINE DEVICE (IUD) REMOVAL;  Surgeon: Hal Morales, MD;  Location: [facility];  Service: Gynecology;  Laterality: N/A;  Removed during prep by E. Lowell Guitar PA  . Bilateral salpingectomy  12/18/2012    Procedure: BILATERAL SALPINGECTOMY;  Surgeon: Hal Morales, MD;  Location: [facility];  Service: Gynecology;  Laterality: Bilateral;   Family History  Problem Relation Age of Onset  . Melanoma Father 29  . Ulcerative colitis Mother 54  . Breast cancer Paternal Grandmother   . Diabetes type II Maternal Grandmother     deceased 89  . Pulmonary embolism Paternal Grandfather    History  Substance Use Topics  . Smoking status: Never Smoker   . Smokeless tobacco: Never Used  . Alcohol Use: No   OB History   Grav Para Term Preterm Abortions TAB SAB Ect Mult Living   0 0             Review  of Systems  Constitutional: Negative for fever and chills.  Respiratory: Negative for cough and shortness of breath.   Cardiovascular: Positive for chest pain. Negative for palpitations and leg swelling.  Gastrointestinal: Negative for nausea, vomiting and abdominal pain.  Musculoskeletal: Positive for back pain.  Skin: Negative for rash and wound.  Neurological: Positive for weakness. Negative for dizziness, light-headedness, numbness and headaches.  All other systems reviewed and are negative.    Allergies  Codeine; Darvocet; Hydrocodone-acetaminophen; Oxycodone-acetaminophen; Percocet; and Latex  Home Medications   Current Outpatient Rx  Name  Route  Sig  Dispense  Refill  . albuterol (PROVENTIL,VENTOLIN) 90 MCG/ACT inhaler    Inhalation   Inhale 2 puffs into the lungs as needed for shortness of breath.          . Insulin Aspart (NOVOLOG FLEXPEN [place])   Subcutaneous   Inject 9 Units into the skin 3 (three) times daily.         . Insulin Detemir (LEVEMIR FLEXPEN [place])   Subcutaneous   Inject 30 Units into the skin 2 (two) times daily.         . INVOKANA 300 MG TABS   Oral   Take 300 mg by mouth daily.          Marland Kitchen lisinopril (PRINIVIL,ZESTRIL) 10 MG tablet   Oral   Take 30 mg by mouth daily.          . Rivaroxaban (XARELTO) 20 MG TABS   Oral   Take 20 mg by mouth at bedtime.         . topiramate (TOPAMAX) 25 MG tablet   Oral   Take 25 mg by mouth daily.         . traMADol (ULTRAM) 50 MG tablet   Oral   Take 50 mg by mouth every 6 (six) hours as needed for pain.           BP 161/101  Pulse 100  Temp(Src) 98.4 F (36.9 C) (Oral)  Resp 18  Ht 5' (1.524 m)  Wt 170 lb (77.111 kg)  BMI 33.2 kg/m2  SpO2 96%  LMP 12/19/2011 Physical Exam  Nursing note and vitals reviewed. Constitutional: She is oriented to person, place, and time. She appears well-developed and well-nourished. No distress.  HENT:  Head: Normocephalic and atraumatic.  Mouth/Throat: Oropharynx is clear and moist.  Eyes: EOM are normal. Pupils are equal, round, and reactive to light.  Neck: Normal range of motion. Neck supple.  Cardiovascular: Normal rate and regular rhythm.   Pulmonary/Chest: Effort normal and breath sounds normal. No respiratory distress. She has no wheezes. She has no rales. She exhibits no tenderness.  Abdominal: Soft. Bowel sounds are normal. She exhibits no distension and no mass. There is no tenderness. There is no rebound and no guarding.  Musculoskeletal: Normal range of motion. She exhibits no edema and no tenderness.  No calf swelling or tenderness.  Neurological: She is alert and oriented to person, place, and time.  Skin: Skin is warm and dry. No rash noted. No erythema.  Psychiatric: She  has a normal mood and affect. Her behavior is normal.    ED Course  Procedures (including critical care time) Labs Review Labs Reviewed  CBC - Abnormal; Notable for the following:    RBC 5.33 (*)    MCV 76.9 (*)    MCH 25.7 (*)    All other components within normal limits  BASIC METABOLIC PANEL - Abnormal; Notable for the following:  Sodium 133 (*)    Glucose, Bld 223 (*)    All other components within normal limits  PROTIME-INR - Abnormal; Notable for the following:    Prothrombin Time 20.4 (*)    INR 1.80 (*)    All other components within normal limits  POCT I-STAT TROPONIN I   Imaging Review No results found.  EKG Interpretation     Ventricular Rate:  93 PR Interval:  150 QRS Duration: 70 QT Interval:  359 QTC Calculation: 447 R Axis:   24 Text Interpretation:  Sinus rhythm Low voltage, precordial leads No significant change since last tracing            MDM   Patient's symptoms have improved. Vital signs remained stable. We'll treat for chest wall pain.   Loren Racer, MD 09/23/13 [phone]

## 2013-09-23 NOTE — ED Notes (Signed)
Pt reports sharp chest pain when she lifts her L breast up that radiates to her back. Pt has pain at rest and during movement. Pain started today. Pt denies n/v, ab pain, dizziness or lightheadedness.

## 2013-09-23 NOTE — ED Notes (Signed)
Pt had pacemaker removed one month ago

## 2013-10-15 ENCOUNTER — Other Ambulatory Visit: Payer: Self-pay | Admitting: Obstetrics and Gynecology

## 2013-10-15 DIAGNOSIS — Z9889 Other specified postprocedural states: Secondary | ICD-10-CM

## 2013-10-15 DIAGNOSIS — Z1231 Encounter for screening mammogram for malignant neoplasm of breast: Secondary | ICD-10-CM

## 2013-11-11 ENCOUNTER — Ambulatory Visit
Admission: RE | Admit: 2013-11-11 | Discharge: 2013-11-11 | Disposition: A | Discharge status: Home / Self Care | Payer: Medicare Other | Source: Ambulatory Visit | Attending: Obstetrics and Gynecology | Admitting: Obstetrics and Gynecology

## 2013-11-11 ENCOUNTER — Ambulatory Visit: Payer: Medicare Other

## 2013-11-11 DIAGNOSIS — Z9889 Other specified postprocedural states: Secondary | ICD-10-CM

## 2013-11-11 DIAGNOSIS — Z1231 Encounter for screening mammogram for malignant neoplasm of breast: Secondary | ICD-10-CM

## 2013-12-11 DIAGNOSIS — R569 Unspecified convulsions: Secondary | ICD-10-CM

## 2013-12-11 HISTORY — DX: Unspecified convulsions: R56.9

## 2013-12-25 ENCOUNTER — Emergency Department (HOSPITAL_COMMUNITY): Payer: Medicare Other

## 2013-12-25 ENCOUNTER — Encounter (HOSPITAL_COMMUNITY): Payer: Self-pay | Admitting: Emergency Medicine

## 2013-12-25 ENCOUNTER — Emergency Department (HOSPITAL_COMMUNITY)
Admission: EM | Admit: 2013-12-25 | Discharge: 2013-12-25 | Disposition: A | Discharge status: Home / Self Care | Payer: Medicare Other | Attending: Emergency Medicine | Admitting: Emergency Medicine

## 2013-12-25 DIAGNOSIS — IMO0002 Reserved for concepts with insufficient information to code with codable children: Secondary | ICD-10-CM | POA: Insufficient documentation

## 2013-12-25 DIAGNOSIS — Z91040 Latex allergy status: Secondary | ICD-10-CM | POA: Insufficient documentation

## 2013-12-25 DIAGNOSIS — Z8742 Personal history of other diseases of the female genital tract: Secondary | ICD-10-CM | POA: Insufficient documentation

## 2013-12-25 DIAGNOSIS — Z794 Long term (current) use of insulin: Secondary | ICD-10-CM | POA: Insufficient documentation

## 2013-12-25 DIAGNOSIS — E669 Obesity, unspecified: Secondary | ICD-10-CM | POA: Insufficient documentation

## 2013-12-25 DIAGNOSIS — Z8659 Personal history of other mental and behavioral disorders: Secondary | ICD-10-CM | POA: Insufficient documentation

## 2013-12-25 DIAGNOSIS — Z86711 Personal history of pulmonary embolism: Secondary | ICD-10-CM | POA: Insufficient documentation

## 2013-12-25 DIAGNOSIS — Z86718 Personal history of other venous thrombosis and embolism: Secondary | ICD-10-CM | POA: Insufficient documentation

## 2013-12-25 DIAGNOSIS — Z8619 Personal history of other infectious and parasitic diseases: Secondary | ICD-10-CM | POA: Insufficient documentation

## 2013-12-25 DIAGNOSIS — E119 Type 2 diabetes mellitus without complications: Secondary | ICD-10-CM | POA: Insufficient documentation

## 2013-12-25 DIAGNOSIS — I10 Essential (primary) hypertension: Secondary | ICD-10-CM | POA: Insufficient documentation

## 2013-12-25 DIAGNOSIS — J45901 Unspecified asthma with (acute) exacerbation: Secondary | ICD-10-CM | POA: Insufficient documentation

## 2013-12-25 DIAGNOSIS — Z8669 Personal history of other diseases of the nervous system and sense organs: Secondary | ICD-10-CM | POA: Insufficient documentation

## 2013-12-25 DIAGNOSIS — Z79899 Other long term (current) drug therapy: Secondary | ICD-10-CM | POA: Insufficient documentation

## 2013-12-25 DIAGNOSIS — Z87440 Personal history of urinary (tract) infections: Secondary | ICD-10-CM | POA: Insufficient documentation

## 2013-12-25 DIAGNOSIS — R079 Chest pain, unspecified: Secondary | ICD-10-CM | POA: Insufficient documentation

## 2013-12-25 DIAGNOSIS — Z8639 Personal history of other endocrine, nutritional and metabolic disease: Secondary | ICD-10-CM | POA: Insufficient documentation

## 2013-12-25 DIAGNOSIS — R000 Tachycardia, unspecified: Secondary | ICD-10-CM | POA: Insufficient documentation

## 2013-12-25 DIAGNOSIS — Z7901 Long term (current) use of anticoagulants: Secondary | ICD-10-CM | POA: Insufficient documentation

## 2013-12-25 DIAGNOSIS — Z862 Personal history of diseases of the blood and blood-forming organs and certain disorders involving the immune mechanism: Secondary | ICD-10-CM | POA: Insufficient documentation

## 2013-12-25 LAB — CBC
HEMATOCRIT: 42.3 % (ref 36.0–46.0)
Hemoglobin: 14.3 g/dL (ref 12.0–15.0)
MCH: 26.6 pg (ref 26.0–34.0)
MCHC: 33.8 g/dL (ref 30.0–36.0)
MCV: 78.6 fL (ref 78.0–100.0)
Platelets: 356 10*3/uL (ref 150–400)
RBC: 5.38 MIL/uL — AB (ref 3.87–5.11)
RDW: 15.1 % (ref 11.5–15.5)
WBC: 11 10*3/uL — AB (ref 4.0–10.5)

## 2013-12-25 LAB — POCT I-STAT TROPONIN I: Troponin i, poc: 0.01 ng/mL (ref 0.00–0.08)

## 2013-12-25 LAB — BASIC METABOLIC PANEL
BUN: 7 mg/dL (ref 6–23)
CALCIUM: 9.6 mg/dL (ref 8.4–10.5)
CHLORIDE: 96 meq/L (ref 96–112)
CO2: 23 meq/L (ref 19–32)
CREATININE: 0.78 mg/dL (ref 0.50–1.10)
GFR calc Af Amer: >90 mL/min (ref >90)
GFR calc non Af Amer: >90 mL/min (ref >90)
Glucose, Bld: 280 mg/dL — ABNORMAL HIGH (ref 70–99)
Potassium: 4.3 mEq/L (ref 3.7–5.3)
Sodium: 136 mEq/L — ABNORMAL LOW (ref 137–147)

## 2013-12-25 LAB — D-DIMER, QUANTITATIVE: D-Dimer, Quant: 0.69 ug/mL-FEU — ABNORMAL HIGH (ref 0.00–0.48)

## 2013-12-25 LAB — PRO B NATRIURETIC PEPTIDE: Pro B Natriuretic peptide (BNP): <5 pg/mL (ref 0–125)

## 2013-12-25 MED ORDER — IOHEXOL 350 MG/ML SOLN
80.0000 mL | Freq: Once | INTRAVENOUS | Status: AC | PRN
  Administered 2013-12-25: 80 mL via INTRAVENOUS

## 2013-12-25 MED ORDER — ALBUTEROL SULFATE HFA 108 (90 BASE) MCG/ACT IN AERS
2.0000 | INHALATION_SPRAY | Freq: Once | RESPIRATORY_TRACT | Status: AC
  Administered 2013-12-25: 2 via RESPIRATORY_TRACT
  Filled 2013-12-25: qty 6.7

## 2013-12-25 MED ORDER — SODIUM CHLORIDE 0.9 % IV BOLUS (SEPSIS)
1000.0000 mL | Freq: Once | INTRAVENOUS | Status: AC
  Administered 2013-12-25: 1000 mL via INTRAVENOUS

## 2013-12-25 NOTE — ED Notes (Signed)
States she woke this am with sharp pain "stabbing through my back into my chest." states the pain makes her feels SOB. She is concerned because shes had a PE in the past. A&ox4, resp e/u

## 2013-12-25 NOTE — ED Notes (Signed)
Pt sts tramadol has been making her nauseous; sts she no longer is taking it; sts she has been taking with and without food with same effect.

## 2013-12-25 NOTE — Discharge Instructions (Signed)
Use albuterol inhaler as needed for shortness of breath. Refer to attached documents for more information. Follow up with your doctor. Return to the ED with worsening or concerning symptoms.

## 2013-12-25 NOTE — ED Provider Notes (Signed)
CSN: [id number]     Arrival date & time 12/25/13  1346 History   First MD Initiated Contact with Patient 12/25/13 1556     Chief Complaint  Patient presents with  . Chest Pain   (Consider location/radiation/quality/duration/timing/severity/associated sxs/prior Treatment) HPI Comments: Patient is a 43 year old female with a past medical history of PE, DVT, diabetes, asthma and hypertension who presents with chest pain that started this morning. The pain woke her from sleep. The pain is located in her right chest and radiates across to her left chest. The pain is pleuritic. The pain is sharp and severe and has remained intermittent throughout the day without known trigger. Patient reports not having symptoms with her previous PE so this pain does not feel the same. She reports associated cough, bilateral lower leg swelling and SOB. No aggravating/alleviating factors. Patient is not a smoker and denies recent travel/surgery/immobilization.   Patient is a 43 y.o. female presenting with chest pain.  Chest Pain Associated symptoms: shortness of breath   Associated symptoms: no abdominal pain, no dizziness, no dysphagia, no fatigue, no fever, no nausea, no palpitations, not vomiting and no weakness     Past Medical History  Diagnosis Date  . PE (pulmonary embolism)     2009 - on oral contraception; multiple  . Syncopal episodes     unknown etiology  . Dysfunctional uterine bleeding     Seen by Dr. Leo Grosser, GYN; pending hysterectomy as of 07/2012  . Asthma   . Obesity   . Mastodynia   . Hypertension   . Post - coital bleeding 2012  . Labial lesion 2013  . BV (bacterial vaginosis) 2012  . Oligomenorrhea 2011  . DVT (deep venous thrombosis) 2011    pt had IUD; also 05/2012  . H/O hypercoagulable state     2/2 contraception  . HLD (hyperlipidemia)   . DM (diabetes mellitus)     diagnosed 2009  . Headache(784.0)   . Anemia   . Herpes simplex without mention of complication 0000000    HSV-2   . Thyroid disease     hypothyroidism  . Vitamin D deficiency   . Major depression     Hx suicide attempt in 2009, [facility] admissions  . Venous insufficiency   . Sleep apnea   . Neuropathy    Past Surgical History  Procedure Laterality Date  . Breast reduction surgery      Dr. Eugene Garnet [facility] 2011  . Cholecystectomy    . Cardiac electrophysiology study & dft      Implantable Loop recorder; no arrhythmias associated with synocpal spells; loop explanted 07-2013  . Endometrial biopsy  2011  . Dilatation & currettage/hysteroscopy with resectocope  2007 & 2013  . Laparoscopic assisted vaginal hysterectomy  12/18/2012    Procedure: LAPAROSCOPIC ASSISTED VAGINAL HYSTERECTOMY;  Surgeon: Eldred Manges, MD;  Location: [place] [facility];  Service: Gynecology;  Laterality: N/A;  . Iud removal  12/18/2012    Procedure: INTRAUTERINE DEVICE (IUD) REMOVAL;  Surgeon: Eldred Manges, MD;  Location: [place] [facility];  Service: Gynecology;  Laterality: N/A;  Removed during prep by E. Florene Glen PA  . Bilateral salpingectomy  12/18/2012    Procedure: BILATERAL SALPINGECTOMY;  Surgeon: Eldred Manges, MD;  Location: [place] [facility];  Service: Gynecology;  Laterality: Bilateral;   Family History  Problem Relation Age of Onset  . Melanoma Father 1  . Ulcerative colitis Mother 24  . Breast cancer Paternal Grandmother   . Diabetes type II Maternal Grandmother  deceased 28  . Pulmonary embolism Paternal Grandfather    History  Substance Use Topics  . Smoking status: Never Smoker   . Smokeless tobacco: Never Used  . Alcohol Use: No   OB History   Grav Para Term Preterm Abortions TAB SAB Ect Mult Living   0 0             Review of Systems  Constitutional: Negative for fever, chills and fatigue.  HENT: Negative for trouble swallowing.   Eyes: Negative for visual disturbance.  Respiratory: Positive for shortness of breath.   Cardiovascular: Positive for chest pain. Negative for palpitations.  Gastrointestinal: Negative for  nausea, vomiting, abdominal pain and diarrhea.  Genitourinary: Negative for dysuria and difficulty urinating.  Musculoskeletal: Negative for arthralgias and neck pain.  Skin: Negative for color change.  Neurological: Negative for dizziness and weakness.  Psychiatric/Behavioral: Negative for dysphoric mood.    Allergies  Codeine; Darvocet; Hydrocodone-acetaminophen; Oxycodone-acetaminophen; Percocet; and Latex  Home Medications   Current Outpatient Rx  Name  Route  Sig  Dispense  Refill  . ibuprofen (ADVIL,MOTRIN) 600 MG tablet   Oral   Take 1 tablet (600 mg total) by mouth every 6 (six) hours as needed for pain.   30 tablet   0   . Insulin Aspart (NOVOLOG FLEXPEN [place])   Subcutaneous   Inject 9 Units into the skin 3 (three) times daily.         . Insulin Detemir (LEVEMIR FLEXPEN [place])   Subcutaneous   Inject 30 Units into the skin 2 (two) times daily.         . INVOKANA 300 MG TABS   Oral   Take 300 mg by mouth daily.          Marland Kitchen lisinopril (PRINIVIL,ZESTRIL) 10 MG tablet   Oral   Take 30 mg by mouth daily.          . mometasone (ASMANEX) 220 MCG/INH inhaler   Inhalation   Inhale 2 puffs into the lungs daily.         . Rivaroxaban (XARELTO) 20 MG TABS   Oral   Take 20 mg by mouth at bedtime.         . topiramate (TOPAMAX) 25 MG tablet   Oral   Take 25 mg by mouth daily.          BP 181/102  Pulse 101  Temp(Src) 98 F (36.7 C) (Oral)  Resp 18  Ht 5' (1.524 m)  Wt 173 lb (78.472 kg)  BMI 33.79 kg/m2  SpO2 100%  LMP 12/19/2011 Physical Exam  Nursing note and vitals reviewed. Constitutional: She is oriented to person, place, and time. She appears well-developed and well-nourished. No distress.  HENT:  Head: Normocephalic and atraumatic.  Eyes: Conjunctivae and EOM are normal.  Neck: Normal range of motion. Neck supple.  Cardiovascular: Regular rhythm.  Exam reveals no gallop and no friction rub.   No murmur heard. tachycardic   Pulmonary/Chest: Effort normal and breath sounds normal. She has no wheezes. She has no rales. She exhibits no tenderness.  Abdominal: Soft. She exhibits no distension. There is no tenderness. There is no rebound and no guarding.  Musculoskeletal: Normal range of motion.  Bilateral lower extremity 1+ edema. No calf tenderness to palpation bilaterally.   Neurological: She is alert and oriented to person, place, and time. Coordination normal.  Speech is goal-oriented. Moves limbs without ataxia.   Skin: Skin is warm and dry.  Psychiatric: She has  a normal mood and affect. Her behavior is normal.    ED Course  Procedures (including critical care time) Labs Review Labs Reviewed  CBC - Abnormal; Notable for the following:    WBC 11.0 (*)    RBC 5.38 (*)    All other components within normal limits  BASIC METABOLIC PANEL - Abnormal; Notable for the following:    Sodium 136 (*)    Glucose, Bld 280 (*)    All other components within normal limits  D-DIMER, QUANTITATIVE - Abnormal; Notable for the following:    D-Dimer, Quant 0.69 (*)    All other components within normal limits  PRO B NATRIURETIC PEPTIDE  POCT I-STAT TROPONIN I   Imaging Review Dg Chest 2 View  12/25/2013   CLINICAL DATA:  Chest pain  EXAM: CHEST  2 VIEW  COMPARISON:  Chest CT September 23, 2013 and chest radiograph May 26, 2012  FINDINGS: Lungs are clear. Heart size and pulmonary vascularity are normal. No pneumothorax. No adenopathy. No bone lesions.  IMPRESSION: No abnormality noted.   Electronically Signed   By: Lowella Grip M.D.   On: 12/25/2013 14:26    EKG Interpretation   None       MDM   1. Chest pain     4:14 PM Patient has elevated d-dimer and history of PE due to oral contraceptives. Chest xray and remaining labs, including troponin unremarkable for acute changes. Patient will have CT angio to rule out PE. Patient is tachycardic with remaining vitals stable.   6:10 PM CT angio unremarkable  for acute PE. Patient's pain and SOB likely related to asthma. Patient will have recommended follow up with PCP. Patient will have albuterol inhaler on discharge. Vitals stable and patient afebrile.    Alvina Chou, PA-C 12/25/13 1812

## 2013-12-25 NOTE — ED Provider Notes (Signed)
Medical screening examination/treatment/procedure(s) were performed by non-physician practitioner and as supervising physician I was immediately available for consultation/collaboration.  EKG Interpretation   None         Osvaldo Shipper, MD 12/25/13 2241

## 2014-01-27 ENCOUNTER — Encounter (HOSPITAL_COMMUNITY): Payer: Self-pay | Admitting: *Deleted

## 2014-01-27 ENCOUNTER — Inpatient Hospital Stay (HOSPITAL_COMMUNITY)
Admission: AD | Admit: 2014-01-27 | Discharge: 2014-01-27 | Disposition: A | Discharge status: Home / Self Care | Payer: Medicare Other | Source: Ambulatory Visit | Attending: Obstetrics and Gynecology | Admitting: Obstetrics and Gynecology

## 2014-01-27 DIAGNOSIS — N390 Urinary tract infection, site not specified: Secondary | ICD-10-CM | POA: Insufficient documentation

## 2014-01-27 DIAGNOSIS — Z86711 Personal history of pulmonary embolism: Secondary | ICD-10-CM | POA: Insufficient documentation

## 2014-01-27 DIAGNOSIS — E669 Obesity, unspecified: Secondary | ICD-10-CM | POA: Insufficient documentation

## 2014-01-27 DIAGNOSIS — R030 Elevated blood-pressure reading, without diagnosis of hypertension: Secondary | ICD-10-CM | POA: Insufficient documentation

## 2014-01-27 DIAGNOSIS — Z86718 Personal history of other venous thrombosis and embolism: Secondary | ICD-10-CM | POA: Insufficient documentation

## 2014-01-27 LAB — GLUCOSE, CAPILLARY: GLUCOSE-CAPILLARY: 215 mg/dL — AB (ref 70–99)

## 2014-01-27 LAB — URINALYSIS, ROUTINE W REFLEX MICROSCOPIC
Bilirubin Urine: NEGATIVE
Ketones, ur: 15 mg/dL — AB
Nitrite: POSITIVE — AB
Protein, ur: 100 mg/dL — AB
SPECIFIC GRAVITY, URINE: 1.015 (ref 1.005–1.030)
UROBILINOGEN UA: 1 mg/dL (ref 0.0–1.0)
pH: 5 (ref 5.0–8.0)

## 2014-01-27 LAB — CBC
HEMATOCRIT: 41.9 % (ref 36.0–46.0)
HEMOGLOBIN: 13.9 g/dL (ref 12.0–15.0)
MCH: 25.5 pg — ABNORMAL LOW (ref 26.0–34.0)
MCHC: 33.2 g/dL (ref 30.0–36.0)
MCV: 76.7 fL — ABNORMAL LOW (ref 78.0–100.0)
Platelets: 329 10*3/uL (ref 150–400)
RBC: 5.46 MIL/uL — ABNORMAL HIGH (ref 3.87–5.11)
RDW: 15.1 % (ref 11.5–15.5)
WBC: 16.8 10*3/uL — AB (ref 4.0–10.5)

## 2014-01-27 LAB — URINE MICROSCOPIC-ADD ON

## 2014-01-27 MED ORDER — FLUCONAZOLE 150 MG PO TABS: 150.0000 mg | ORAL_TABLET | Freq: Once | ORAL | Status: DC

## 2014-01-27 MED ORDER — CIPROFLOXACIN HCL 500 MG PO TABS: 500.0000 mg | ORAL_TABLET | Freq: Two times a day (BID) | ORAL | Status: AC

## 2014-01-27 NOTE — Progress Notes (Signed)
Dr. Charlesetta Garibaldi evaluated patient and performed pelvic exam. Small amount of bleeding on pad, but no blood noted in vagina. Discussed plan of care.

## 2014-01-27 NOTE — Discharge Instructions (Signed)
Urinary Tract Infection °A urinary tract infection (UTI) can occur any place along the urinary tract. The tract includes the kidneys, ureters, bladder, and urethra. A type of germ called bacteria often causes a UTI. UTIs are often helped with antibiotic medicine.  °HOME CARE  °· If given, take antibiotics as told by your doctor. Finish them even if you start to feel better. °· Drink enough fluids to keep your pee (urine) clear or pale yellow. °· Avoid tea, drinks with caffeine, and bubbly (carbonated) drinks. °· Pee often. Avoid holding your pee in for a long time. °· Pee before and after having sex (intercourse). °· Wipe from front to back after you poop (bowel movement) if you are a woman. Use each tissue only once. °GET HELP RIGHT AWAY IF:  °· You have back pain. °· You have lower belly (abdominal) pain. °· You have chills. °· You feel sick to your stomach (nauseous). °· You throw up (vomit). °· Your burning or discomfort with peeing does not go away. °· You have a fever. °· Your symptoms are not better in 3 days. °MAKE SURE YOU:  °· Understand these instructions. °· Will watch your condition. °· Will get help right away if you are not doing well or get worse. °Document Released: 05/15/2008 Document Revised: 08/21/2012 Document Reviewed: 06/27/2012 °ExitCare® Patient Information ©2014 [employer]. ° °

## 2014-01-27 NOTE — MAU Note (Signed)
Patient states she is a patient of Dr. Leo Grosser and has had a hysterectomy. States she is now scheduled for the bladder sling procedure with Dr. Mancel Bale 3/6. States she noted bleeding this morning when she woke up. States bleeding comes out into toilet, but not more than a "pantyliner" otherwise. States she has pain in lower private area when she "pees."

## 2014-01-27 NOTE — MAU Provider Note (Addendum)
History     CSN: [id number]  Arrival date and time: 01/27/14 1130   First Provider Initiated Contact with Patient 01/27/14 1215      Chief Complaint  Patient presents with  . Vaginal Bleeding   HPI pt presents with hematuria and dysuria.  No severe abdominal pain.    OB History   Grav Para Term Preterm Abortions TAB SAB Ect Mult Living   0 0              Past Medical History  Diagnosis Date  . PE (pulmonary embolism)     2009 - on oral contraception; multiple  . Syncopal episodes     unknown etiology  . Dysfunctional uterine bleeding     Seen by Dr. Leo Grosser, GYN; pending hysterectomy as of 07/2012  . Asthma   . Obesity   . Mastodynia   . Hypertension   . Post - coital bleeding 2012  . Labial lesion 2013  . BV (bacterial vaginosis) 2012  . Oligomenorrhea 2011  . DVT (deep venous thrombosis) 2011    pt had IUD; also 05/2012  . H/O hypercoagulable state     2/2 contraception  . HLD (hyperlipidemia)   . DM (diabetes mellitus)     diagnosed 2009  . Headache(784.0)   . Anemia   . Herpes simplex without mention of complication 05/5034    HSV-2  . Thyroid disease     hypothyroidism  . Vitamin D deficiency   . Major depression     Hx suicide attempt in 2009, [facility] admissions  . Venous insufficiency   . Sleep apnea   . Neuropathy     Past Surgical History  Procedure Laterality Date  . Breast reduction surgery      Dr. Eugene Garnet [facility] 2011  . Cholecystectomy    . Cardiac electrophysiology study & dft      Implantable Loop recorder; no arrhythmias associated with synocpal spells; loop explanted 07-2013  . Endometrial biopsy  2011  . Dilatation & currettage/hysteroscopy with resectocope  2007 & 2013  . Laparoscopic assisted vaginal hysterectomy  12/18/2012    Procedure: LAPAROSCOPIC ASSISTED VAGINAL HYSTERECTOMY;  Surgeon: Eldred Manges, MD;  Location: [facility];  Service: Gynecology;  Laterality: N/A;  . Iud removal  12/18/2012    Procedure: INTRAUTERINE DEVICE (IUD)  REMOVAL;  Surgeon: Eldred Manges, MD;  Location: [facility];  Service: Gynecology;  Laterality: N/A;  Removed during prep by E. Florene Glen PA  . Bilateral salpingectomy  12/18/2012    Procedure: BILATERAL SALPINGECTOMY;  Surgeon: Eldred Manges, MD;  Location: [facility];  Service: Gynecology;  Laterality: Bilateral;  . Abdominal hysterectomy      Family History  Problem Relation Age of Onset  . Melanoma Father 3  . Ulcerative colitis Mother 59  . Breast cancer Paternal Grandmother   . Diabetes type II Maternal Grandmother     deceased 31  . Pulmonary embolism Paternal Grandfather     History  Substance Use Topics  . Smoking status: Never Smoker   . Smokeless tobacco: Never Used  . Alcohol Use: No    Allergies:  Allergies  Allergen Reactions  . Codeine Nausea And Vomiting       . Darvocet [Propoxyphene N-Acetaminophen] Nausea And Vomiting    vomiting  . Hydrocodone-Acetaminophen Nausea And Vomiting    Vomiting   . Oxycodone-Acetaminophen Nausea And Vomiting    vomiting  . Percocet [Oxycodone-Acetaminophen] Nausea And Vomiting    vomiting  . Latex Rash  rash    Prescriptions prior to admission  Medication Sig Dispense Refill  . albuterol (PROVENTIL HFA;VENTOLIN HFA) 108 (90 BASE) MCG/ACT inhaler Inhale 2 puffs into the lungs every 6 (six) hours as needed for wheezing or shortness of breath.      . Budesonide-Formoterol Fumarate (SYMBICORT IN) Inhale 1 puff into the lungs daily.      Marland Kitchen ibuprofen (ADVIL,MOTRIN) 600 MG tablet Take 1 tablet (600 mg total) by mouth every 6 (six) hours as needed for pain.  30 tablet  0  . Insulin Aspart (NOVOLOG FLEXPEN Dent) Inject 9 Units into the skin 3 (three) times daily with meals as needed. Takes 9 units 3 times a day if blood sugar is over 200      . Insulin Detemir (LEVEMIR FLEXPEN [place]) Inject 40 Units into the skin at bedtime.       . INVOKANA 300 MG TABS Take 300 mg by mouth daily.       Marland Kitchen lisinopril (PRINIVIL,ZESTRIL) 10 MG tablet Take  30 mg by mouth daily.       . methyldopa (ALDOMET) 250 MG tablet Take 250 mg by mouth daily.      . Rivaroxaban (XARELTO) 20 MG TABS Take 20 mg by mouth at bedtime.      . topiramate (TOPAMAX) 25 MG tablet Take 25 mg by mouth daily.        ROS Physical Exam   Blood pressure 152/106, pulse 110, temperature 98.5 F (36.9 C), temperature source Oral, resp. rate 18, height 5' (1.524 m), weight 173 lb (78.472 kg), last menstrual period 12/19/2011.  Physical Exam Physical Examination: General appearance - alert, well appearing, and in no distress Abdomen - soft, nontender, nondistended, no masses or organomegaly Breasts - breasts appear normal, no suspicious masses, no skin or nipple changes or axillary nodes, not examined Pelvic - VULVA: normal appearing vulva with no masses, tenderness or lesions, VAGINA: normal appearing vagina with normal color and discharge, no lesions, no blood in the vault Extremities - Homan's sign negative bilaterally  MAU Course  Procedures  MDM Recent Results (from the past 2160 hour(s))  CBC     Status: Abnormal   Collection Time    12/25/13  1:56 PM      Result Value Ref Range   WBC 11.0 (*) 4.0 - 10.5 K/uL   RBC 5.38 (*) 3.87 - 5.11 MIL/uL   Hemoglobin 14.3  12.0 - 15.0 g/dL   HCT 42.3  36.0 - 46.0 %   MCV 78.6  78.0 - 100.0 fL   MCH 26.6  26.0 - 34.0 pg   MCHC 33.8  30.0 - 36.0 g/dL   RDW 15.1  11.5 - 15.5 %   Platelets 356  150 - 400 K/uL  BASIC METABOLIC PANEL     Status: Abnormal   Collection Time    12/25/13  1:56 PM      Result Value Ref Range   Sodium 136 (*) 137 - 147 mEq/L   Potassium 4.3  3.7 - 5.3 mEq/L   Chloride 96  96 - 112 mEq/L   CO2 23  19 - 32 mEq/L   Glucose, Bld 280 (*) 70 - 99 mg/dL   BUN 7  6 - 23 mg/dL   Creatinine, Ser 0.78  0.50 - 1.10 mg/dL   Calcium 9.6  8.4 - 10.5 mg/dL   GFR calc non Af Amer >90  >90 mL/min   GFR calc Af Amer >90  >90 mL/min  Comment: (NOTE)     The eGFR has been calculated using the CKD EPI  equation.     This calculation has not been validated in all clinical situations.     eGFR's persistently <90 mL/min signify possible Chronic Kidney     Disease.  PRO B NATRIURETIC PEPTIDE     Status: None   Collection Time    12/25/13  1:56 PM      Result Value Ref Range   Pro B Natriuretic peptide (BNP) <5.0  0 - 125 pg/mL  D-DIMER, QUANTITATIVE     Status: Abnormal   Collection Time    12/25/13  1:56 PM      Result Value Ref Range   D-Dimer, Quant 0.69 (*) 0.00 - 0.48 ug/mL-FEU   Comment:            AT THE INHOUSE ESTABLISHED CUTOFF     VALUE OF 0.48 ug/mL FEU,     THIS ASSAY HAS BEEN DOCUMENTED     IN THE LITERATURE TO HAVE     A SENSITIVITY AND NEGATIVE     PREDICTIVE VALUE OF AT LEAST     98 TO 99%.  THE TEST RESULT     SHOULD BE CORRELATED WITH     AN ASSESSMENT OF THE CLINICAL     PROBABILITY OF DVT / VTE.  POCT I-STAT TROPONIN I     Status: None   Collection Time    12/25/13  2:38 PM      Result Value Ref Range   Troponin i, poc 0.01  0.00 - 0.08 ng/mL   Comment 3            Comment: Due to the release kinetics of cTnI,     a negative result within the first hours     of the onset of symptoms does not rule out     myocardial infarction with certainty.     If myocardial infarction is still suspected,     repeat the test at appropriate intervals.  URINALYSIS, ROUTINE W REFLEX MICROSCOPIC     Status: Abnormal   Collection Time    01/27/14 11:40 AM      Result Value Ref Range   Color, Urine RED (*) YELLOW   Comment: BIOCHEMICALS MAY BE AFFECTED BY COLOR   APPearance CLOUDY (*) CLEAR   Specific Gravity, Urine 1.015  1.005 - 1.030   pH 5.0  5.0 - 8.0   Glucose, UA >1000 (*) NEGATIVE mg/dL   Hgb urine dipstick LARGE (*) NEGATIVE   Bilirubin Urine NEGATIVE  NEGATIVE   Ketones, ur 15 (*) NEGATIVE mg/dL   Protein, ur 100 (*) NEGATIVE mg/dL   Urobilinogen, UA 1.0  0.0 - 1.0 mg/dL   Nitrite POSITIVE (*) NEGATIVE   Leukocytes, UA SMALL (*) NEGATIVE  URINE  MICROSCOPIC-ADD ON     Status: None   Collection Time    01/27/14 11:40 AM      Result Value Ref Range   RBC / HPF TOO NUMEROUS TO COUNT  <3 RBC/hpf  CBC     Status: Abnormal   Collection Time    01/27/14 12:11 PM      Result Value Ref Range   WBC 16.8 (*) 4.0 - 10.5 K/uL   RBC 5.46 (*) 3.87 - 5.11 MIL/uL   Hemoglobin 13.9  12.0 - 15.0 g/dL   HCT 41.9  36.0 - 46.0 %   MCV 76.7 (*) 78.0 - 100.0 fL   MCH 25.5 (*)  26.0 - 34.0 pg   MCHC 33.2  30.0 - 36.0 g/dL   RDW 15.1  11.5 - 15.5 %   Platelets 329  150 - 400 K/uL     Assessment and Plan  UTI Will give ciprofloxacin BP is elevated pt to follow up Check cbg Increase fluids Bleeding precautions given to the pt BS 215.  Pt told the importance of keeping her BS under control  Halbur A 01/27/2014, 12:54 PM

## 2014-01-29 LAB — URINE CULTURE

## 2014-02-02 ENCOUNTER — Other Ambulatory Visit: Payer: Self-pay | Admitting: Obstetrics and Gynecology

## 2014-02-04 ENCOUNTER — Other Ambulatory Visit (HOSPITAL_COMMUNITY): Payer: Self-pay | Admitting: Obstetrics and Gynecology

## 2014-02-04 NOTE — H&P (Signed)
Makayla Frazier is a 43 y.o.  G 0  female who is S/P hyperectomy presents for placement of tension-free vaginal tape because of stress urinary incontinence.  For the past 6 months the patient has noticed leaking of urine with laughing, coughing or sneezing.  She goes on to report a sensation of incomplete emptying and post void dribbling.  She denies dysuria, urgency, retention,  flank pain, fever, dyspareunia, pelvic pain  or changes in bowel function. In November 2014 the patient underwent urodynamic testing (Lumax) and was found to have stress urinary incontinence.  A review of both medical and surgical management options were given to the patient however, she prefers to proceed with surgical management in the form of placement of tension-free vaginal tape.   Past Medical History  OB History: G0P0   GYN History: menarche: 43 YO    LMP: 12/18/2012;    Contracepton Hysterectomy  The patient reports a past history of: herpes and HPV.  Remote History of abnormal PAP smear as a teen that was treated with cryotherapy;   Last PAP smear: 7/1/2-13  Medical History: Thyroid Disease, Hypertension, Diabetes Mellitus, Vitamin D. Deficiency, Deep Vein Thrombosis/Pulmonary Embolism, Anemia, Transient Ischemic Attack,  Depression (suicide attempt 2009),  Asthma, Sleep Apnea, Foot Neuropathy and Stress Urinary Incontinence.  Surgical History: 1999 Cholecystectomy; 2011 Breast Reduction;  2007 & 2013 D & C; 2014 Laparoscopically Assisted Vaginal Hysterectomy/Bilateral Salpingectomy Denies problems with anesthesia or history of blood transfusions  Family History: Ulcerative Colitis, Skin/Bone Cancer, Hypertension, Diabetes Mellitus, Pulmonary Embolism, Breast Cancer  Social History: Single and unemployed; Denies tobacco use but does consume alcohol occasionally   Medications:   Xarelto 20 mg daily Amlodipine 5 mg  daily Asmanex Twisthaler 220 mcg  Aspirin daily Atorvastatin 10 mg daily Invokana 300 mg  daily Lantus 100 units/mL,  3 mL Pen Levemir 100 units/mL  SQ Lisinopril 20 mg/HCTZ 12.5 mg daily Loratidine 10 mg ProAir HFA 90 mcg Inhaler Symbicort 80 mcg-4.5 mcg HFA   Allergies  Allergen Reactions  . Codeine Nausea And Vomiting       . Darvocet [Propoxyphene N-Acetaminophen] Nausea And Vomiting    vomiting  . Hydrocodone-Acetaminophen Nausea And Vomiting    Vomiting   . Oxycodone-Acetaminophen Nausea And Vomiting    vomiting  . Percocet [Oxycodone-Acetaminophen] Nausea And Vomiting    vomiting  . Latex Rash    rash   ROS: Admits to glasses and constipation;  Denies headache, vision changes, nasal congestion, dysphagia, tinnitus, dizziness, hoarseness, cough,  chest pain, shortness of breath, nausea, vomiting, diarrhea,  urinary frequency, urgency  dysuria, hematuria, vaginitis symptoms, pelvic pain, swelling of joints,easy bruising,  myalgias, arthralgias, skin rashes, unexplained weight loss and except as is mentioned in the history of present illness, patient's review of systems is otherwise negative.   Denies sensitivity to peanuts, shellfish, soy, or adhesives.   Physical Exam  Bp: 114/78   P: 80    R: 14   Temperature: 98.2 degrees F orally  Weight: 175 lbs.  Height: 5 '   BMI: 34.2  Neck: supple without masses or thyromegaly Lungs: clear to auscultation Heart: regular rate and rhythm Abdomen: soft, non-tender and no organomegaly Pelvic:EGBUS- wnl; vagina-normal rugae with small cystocele; uterus/cervix-surgically absent, adnexae-no tenderness or masses Extremities:  no clubbing, cyanosis or edema   Assesment:  Stress Urinary Incontinence             Small Cystocele   Disposition:  A discussion was held with patient regarding the indication  for her procedure(s) along with the risks, which include but are not limited to: reaction to anesthesia, damage to adjacent organs, infection, urinary retention, erosion of tension free vaginal tape and excessive bleeding.   The patient verbalized understanding of these risks and has consented to proceed with Placement of Tension-Free Vaginal Tape and Cystoscopy at [facility] on February 13, 2014 at 9 a.m.   CSN# [id number]   Vinisha Faxon J. Florene Glen, PA-C  for Dr. Harvie Bridge. Mancel Bale

## 2014-02-06 NOTE — Patient Instructions (Addendum)
Your procedure is scheduled on: Friday, February 13, 2014  Enter through the [employer] [facility] at: 7:30 am  Pick up the phone at the desk and dial [phone].  Call this number if you have problems the morning of surgery: [phone].  Remember: Do NOT eat food: AFTER MIDNIGHT THURSDAY Do NOT drink clear liquids after: AFTER MIDNIGHT THURSDAY Take these medicines the morning of surgery with a SIP OF WATER: NONE *TAKE FULL DOSE DIABETIC MEDICATION AT DINNER* *TAKE 1/2 (HALF) DOSE DIABETIC MEDICATION AT BEDTIME* **STOP XARELTO AS DIRECTED BY YOUR DOCTOR**  Do NOT wear jewelry (body piercing), make-up, or nail polish. Do NOT wear lotions, powders, or perfumes.  You may wear deoderant. Do NOT shave for 48 hours prior to surgery. Do NOT bring valuables to the hospital. Contacts, dentures, or bridgework may not be worn into surgery.  Have a responsible adult drive you home and stay with you for 24 hours after your procedure

## 2014-02-09 ENCOUNTER — Encounter (HOSPITAL_COMMUNITY): Payer: Self-pay

## 2014-02-09 ENCOUNTER — Encounter (HOSPITAL_COMMUNITY)
Admission: RE | Admit: 2014-02-09 | Discharge: 2014-02-09 | Disposition: A | Discharge status: Home / Self Care | Payer: Medicare Other | Source: Ambulatory Visit | Attending: Obstetrics and Gynecology | Admitting: Obstetrics and Gynecology

## 2014-02-09 DIAGNOSIS — Z01812 Encounter for preprocedural laboratory examination: Secondary | ICD-10-CM | POA: Insufficient documentation

## 2014-02-09 HISTORY — DX: Myoneural disorder, unspecified: G70.9

## 2014-02-09 HISTORY — DX: Presence of other cardiac implants and grafts: Z95.818

## 2014-02-09 HISTORY — DX: Unspecified convulsions: R56.9

## 2014-02-09 HISTORY — DX: Unspecified osteoarthritis, unspecified site: M19.90

## 2014-02-09 HISTORY — DX: Cerebral infarction, unspecified: I63.9

## 2014-02-09 LAB — CBC
HCT: 40.4 % (ref 36.0–46.0)
Hemoglobin: 13.5 g/dL (ref 12.0–15.0)
MCH: 25.7 pg — AB (ref 26.0–34.0)
MCHC: 33.4 g/dL (ref 30.0–36.0)
MCV: 77 fL — AB (ref 78.0–100.0)
Platelets: 319 10*3/uL (ref 150–400)
RBC: 5.25 MIL/uL — AB (ref 3.87–5.11)
RDW: 15 % (ref 11.5–15.5)
WBC: 10.4 10*3/uL (ref 4.0–10.5)

## 2014-02-09 LAB — BASIC METABOLIC PANEL
BUN: 9 mg/dL (ref 6–23)
CALCIUM: 9.3 mg/dL (ref 8.4–10.5)
CHLORIDE: 98 meq/L (ref 96–112)
CO2: 25 meq/L (ref 19–32)
CREATININE: 0.72 mg/dL (ref 0.50–1.10)
GFR calc Af Amer: >90 mL/min (ref >90)
GFR calc non Af Amer: >90 mL/min (ref >90)
Glucose, Bld: 177 mg/dL — ABNORMAL HIGH (ref 70–99)
Potassium: 3.9 mEq/L (ref 3.7–5.3)
Sodium: 137 mEq/L (ref 137–147)

## 2014-02-13 ENCOUNTER — Ambulatory Visit (HOSPITAL_COMMUNITY): Payer: Medicare Other | Admitting: Certified Registered"

## 2014-02-13 ENCOUNTER — Observation Stay (HOSPITAL_COMMUNITY)
Admission: RE | Admit: 2014-02-13 | Discharge: 2014-02-14 | Disposition: A | Discharge status: Home / Self Care | Payer: Medicare Other | Source: Ambulatory Visit | Attending: Obstetrics and Gynecology | Admitting: Obstetrics and Gynecology

## 2014-02-13 ENCOUNTER — Encounter (HOSPITAL_COMMUNITY): Payer: Medicare Other | Admitting: Certified Registered"

## 2014-02-13 ENCOUNTER — Encounter (HOSPITAL_COMMUNITY): Payer: Self-pay

## 2014-02-13 ENCOUNTER — Encounter (HOSPITAL_COMMUNITY)
Admission: RE | Disposition: A | Discharge status: Home / Self Care | Payer: Self-pay | Source: Ambulatory Visit | Attending: Obstetrics and Gynecology

## 2014-02-13 DIAGNOSIS — E119 Type 2 diabetes mellitus without complications: Secondary | ICD-10-CM | POA: Insufficient documentation

## 2014-02-13 DIAGNOSIS — I10 Essential (primary) hypertension: Secondary | ICD-10-CM | POA: Insufficient documentation

## 2014-02-13 DIAGNOSIS — N993 Prolapse of vaginal vault after hysterectomy: Secondary | ICD-10-CM | POA: Insufficient documentation

## 2014-02-13 DIAGNOSIS — Z86718 Personal history of other venous thrombosis and embolism: Secondary | ICD-10-CM | POA: Diagnosis not present

## 2014-02-13 DIAGNOSIS — R32 Unspecified urinary incontinence: Secondary | ICD-10-CM | POA: Diagnosis present

## 2014-02-13 DIAGNOSIS — Z90710 Acquired absence of both cervix and uterus: Secondary | ICD-10-CM | POA: Diagnosis not present

## 2014-02-13 DIAGNOSIS — Z794 Long term (current) use of insulin: Secondary | ICD-10-CM | POA: Insufficient documentation

## 2014-02-13 DIAGNOSIS — N393 Stress incontinence (female) (male): Principal | ICD-10-CM | POA: Insufficient documentation

## 2014-02-13 DIAGNOSIS — G4730 Sleep apnea, unspecified: Secondary | ICD-10-CM | POA: Insufficient documentation

## 2014-02-13 DIAGNOSIS — R569 Unspecified convulsions: Secondary | ICD-10-CM | POA: Insufficient documentation

## 2014-02-13 HISTORY — PX: BLADDER SUSPENSION: SHX72

## 2014-02-13 LAB — ELECTROLYTE PANEL
CO2: 24 mEq/L (ref 19–32)
Chloride: 98 mEq/L (ref 96–112)
Potassium: 3.6 mEq/L — ABNORMAL LOW (ref 3.7–5.3)
Sodium: 138 mEq/L (ref 137–147)

## 2014-02-13 LAB — GLUCOSE, CAPILLARY
GLUCOSE-CAPILLARY: 145 mg/dL — AB (ref 70–99)
Glucose-Capillary: 149 mg/dL — ABNORMAL HIGH (ref 70–99)
Glucose-Capillary: 165 mg/dL — ABNORMAL HIGH (ref 70–99)
Glucose-Capillary: 115 mg/dL — ABNORMAL HIGH (ref 70–99)

## 2014-02-13 LAB — MRSA PCR SCREENING: MRSA by PCR: NEGATIVE

## 2014-02-13 SURGERY — TRANSVAGINAL TAPE (TVT) PROCEDURE
Anesthesia: General | Site: Vagina

## 2014-02-13 MED ORDER — LACTATED RINGERS IV SOLN
INTRAVENOUS | Status: DC
  Administered 2014-02-13: 21:00:00 via INTRAVENOUS

## 2014-02-13 MED ORDER — LACTATED RINGERS IV SOLN
INTRAVENOUS | Status: DC
  Administered 2014-02-13: 50 mL/h via INTRAVENOUS
  Administered 2014-02-13: 10:00:00 via INTRAVENOUS

## 2014-02-13 MED ORDER — KETOROLAC TROMETHAMINE 30 MG/ML IJ SOLN
INTRAMUSCULAR | Status: AC
  Filled 2014-02-13: qty 1

## 2014-02-13 MED ORDER — DIPHENHYDRAMINE HCL 50 MG/ML IJ SOLN: 12.5000 mg | Freq: Four times a day (QID) | INTRAMUSCULAR | Status: DC | PRN

## 2014-02-13 MED ORDER — SODIUM CHLORIDE 0.9 % IJ SOLN: 9.0000 mL | INTRAMUSCULAR | Status: DC | PRN

## 2014-02-13 MED ORDER — INSULIN ASPART 100 UNIT/ML ~~LOC~~ SOLN
0.0000 [IU] | Freq: Three times a day (TID) | SUBCUTANEOUS | Status: DC
  Administered 2014-02-13: 4 [IU] via SUBCUTANEOUS

## 2014-02-13 MED ORDER — LISINOPRIL 20 MG PO TABS
30.0000 mg | ORAL_TABLET | Freq: Every day | ORAL | Status: DC
  Administered 2014-02-13: 30 mg via ORAL
  Filled 2014-02-13 (×2): qty 1

## 2014-02-13 MED ORDER — METHYLENE BLUE 1 % INJ SOLN
INTRAMUSCULAR | Status: AC
  Filled 2014-02-13: qty 10

## 2014-02-13 MED ORDER — LIDOCAINE HCL (CARDIAC) 20 MG/ML IV SOLN
INTRAVENOUS | Status: AC
  Filled 2014-02-13: qty 5

## 2014-02-13 MED ORDER — LORAZEPAM 2 MG/ML IJ SOLN
INTRAMUSCULAR | Status: AC
  Administered 2014-02-13: 2 mg via INTRAVENOUS
  Filled 2014-02-13: qty 1

## 2014-02-13 MED ORDER — FENTANYL CITRATE 0.05 MG/ML IJ SOLN
25.0000 ug | INTRAMUSCULAR | Status: DC | PRN
  Administered 2014-02-13: 50 ug via INTRAVENOUS
  Administered 2014-02-13: 25 ug via INTRAVENOUS

## 2014-02-13 MED ORDER — DIPHENHYDRAMINE HCL 12.5 MG/5ML PO ELIX
12.5000 mg | ORAL_SOLUTION | Freq: Four times a day (QID) | ORAL | Status: DC | PRN
  Filled 2014-02-13: qty 5

## 2014-02-13 MED ORDER — DEXAMETHASONE SODIUM PHOSPHATE 10 MG/ML IJ SOLN
INTRAMUSCULAR | Status: AC
  Filled 2014-02-13: qty 1

## 2014-02-13 MED ORDER — SODIUM CHLORIDE 0.9 % IJ SOLN
INTRAMUSCULAR | Status: AC
  Filled 2014-02-13: qty 100

## 2014-02-13 MED ORDER — FENTANYL CITRATE 0.05 MG/ML IJ SOLN
INTRAMUSCULAR | Status: AC
  Filled 2014-02-13: qty 2

## 2014-02-13 MED ORDER — DEXAMETHASONE SODIUM PHOSPHATE 10 MG/ML IJ SOLN
INTRAMUSCULAR | Status: DC | PRN
  Administered 2014-02-13: 10 mg via INTRAVENOUS

## 2014-02-13 MED ORDER — LORAZEPAM 2 MG/ML IJ SOLN
0.5000 mg | Freq: Once | INTRAMUSCULAR | Status: AC
  Administered 2014-02-13: 0.5 mg via INTRAVENOUS

## 2014-02-13 MED ORDER — BUDESONIDE-FORMOTEROL FUMARATE 80-4.5 MCG/ACT IN AERO
2.0000 | INHALATION_SPRAY | Freq: Every day | RESPIRATORY_TRACT | Status: DC
Start: 2014-02-13 — End: 2014-02-14
  Administered 2014-02-13 – 2014-02-14 (×2): 2 via RESPIRATORY_TRACT
  Filled 2014-02-13: qty 6.9

## 2014-02-13 MED ORDER — LORAZEPAM 2 MG/ML IJ SOLN
3.0000 mg | Freq: Once | INTRAMUSCULAR | Status: AC
  Administered 2014-02-13: 2 mg via INTRAVENOUS

## 2014-02-13 MED ORDER — LORAZEPAM 2 MG/ML IJ SOLN
INTRAMUSCULAR | Status: AC
  Administered 2014-02-13: 0.5 mg via INTRAVENOUS
  Filled 2014-02-13: qty 1

## 2014-02-13 MED ORDER — KETOROLAC TROMETHAMINE 30 MG/ML IJ SOLN
30.0000 mg | Freq: Four times a day (QID) | INTRAMUSCULAR | Status: DC
  Administered 2014-02-13: 30 mg via INTRAVENOUS

## 2014-02-13 MED ORDER — MIDAZOLAM HCL 2 MG/2ML IJ SOLN
INTRAMUSCULAR | Status: AC
  Filled 2014-02-13: qty 2

## 2014-02-13 MED ORDER — PROPOFOL 10 MG/ML IV EMUL
INTRAVENOUS | Status: AC
  Filled 2014-02-13: qty 20

## 2014-02-13 MED ORDER — ESTRADIOL 0.1 MG/GM VA CREA
TOPICAL_CREAM | VAGINAL | Status: AC
  Filled 2014-02-13: qty 42.5

## 2014-02-13 MED ORDER — FENTANYL CITRATE 0.05 MG/ML IJ SOLN
INTRAMUSCULAR | Status: AC
  Administered 2014-02-13: 50 ug via INTRAVENOUS
  Filled 2014-02-13: qty 2

## 2014-02-13 MED ORDER — VASOPRESSIN 20 UNIT/ML IJ SOLN
INTRAMUSCULAR | Status: AC
  Filled 2014-02-13: qty 1

## 2014-02-13 MED ORDER — CANAGLIFLOZIN 300 MG PO TABS
300.0000 mg | ORAL_TABLET | Freq: Every day | ORAL | Status: DC
  Administered 2014-02-13: 300 mg via ORAL

## 2014-02-13 MED ORDER — ALBUTEROL SULFATE (2.5 MG/3ML) 0.083% IN NEBU: 3.0000 mL | INHALATION_SOLUTION | Freq: Four times a day (QID) | RESPIRATORY_TRACT | Status: DC | PRN

## 2014-02-13 MED ORDER — HYDROMORPHONE HCL 2 MG PO TABS
2.0000 mg | ORAL_TABLET | Freq: Four times a day (QID) | ORAL | Status: DC | PRN
  Administered 2014-02-14: 2 mg via ORAL
  Filled 2014-02-13: qty 1

## 2014-02-13 MED ORDER — LIDOCAINE HCL (CARDIAC) 20 MG/ML IV SOLN
INTRAVENOUS | Status: DC | PRN
  Administered 2014-02-13: 80 mg via INTRAVENOUS

## 2014-02-13 MED ORDER — INSULIN DETEMIR 100 UNIT/ML ~~LOC~~ SOLN
20.0000 [IU] | Freq: Every day | SUBCUTANEOUS | Status: DC
  Administered 2014-02-13: 20 [IU] via SUBCUTANEOUS
  Filled 2014-02-13 (×2): qty 0.2

## 2014-02-13 MED ORDER — KETOROLAC TROMETHAMINE 30 MG/ML IJ SOLN
INTRAMUSCULAR | Status: DC | PRN
  Administered 2014-02-13: 30 mg via INTRAVENOUS

## 2014-02-13 MED ORDER — TOPIRAMATE 25 MG PO TABS
25.0000 mg | ORAL_TABLET | Freq: Every day | ORAL | Status: DC
  Administered 2014-02-13 – 2014-02-14 (×2): 25 mg via ORAL
  Filled 2014-02-13 (×3): qty 1

## 2014-02-13 MED ORDER — VASOPRESSIN 20 UNIT/ML IJ SOLN
INTRAVENOUS | Status: DC | PRN
  Administered 2014-02-13: 10:00:00 via INTRAMUSCULAR

## 2014-02-13 MED ORDER — METHYLENE BLUE 1 % INJ SOLN
INTRAMUSCULAR | Status: DC | PRN
  Administered 2014-02-13: 8 mL via INTRAVENOUS

## 2014-02-13 MED ORDER — MIDAZOLAM HCL 2 MG/2ML IJ SOLN
INTRAMUSCULAR | Status: DC | PRN
  Administered 2014-02-13: 2 mg via INTRAVENOUS

## 2014-02-13 MED ORDER — PROPOFOL 10 MG/ML IV BOLUS
INTRAVENOUS | Status: DC | PRN
  Administered 2014-02-13: 170 mg via INTRAVENOUS

## 2014-02-13 MED ORDER — ACETAMINOPHEN 160 MG/5ML PO SOLN
325.0000 mg | ORAL | Status: DC | PRN
  Administered 2014-02-13: 650 mg via ORAL

## 2014-02-13 MED ORDER — AMLODIPINE BESYLATE 10 MG PO TABS
10.0000 mg | ORAL_TABLET | Freq: Every day | ORAL | Status: DC
  Administered 2014-02-13: 10 mg via ORAL
  Filled 2014-02-13 (×2): qty 1

## 2014-02-13 MED ORDER — ACETAMINOPHEN 160 MG/5ML PO SOLN
ORAL | Status: AC
  Administered 2014-02-13: 650 mg via ORAL
  Filled 2014-02-13: qty 20.3

## 2014-02-13 MED ORDER — STERILE WATER FOR IRRIGATION IR SOLN
Status: DC | PRN
  Administered 2014-02-13: 1000 mL via INTRAVESICAL

## 2014-02-13 MED ORDER — ONDANSETRON HCL 4 MG/2ML IJ SOLN
INTRAMUSCULAR | Status: DC | PRN
  Administered 2014-02-13: 4 mg via INTRAVENOUS

## 2014-02-13 MED ORDER — FENTANYL CITRATE 0.05 MG/ML IJ SOLN
INTRAMUSCULAR | Status: DC | PRN
  Administered 2014-02-13 (×2): 50 ug via INTRAVENOUS

## 2014-02-13 MED ORDER — ACETAMINOPHEN 325 MG PO TABS
325.0000 mg | ORAL_TABLET | ORAL | Status: DC | PRN
Start: 2014-02-13 — End: 2014-02-13

## 2014-02-13 MED ORDER — INSULIN ASPART 100 UNIT/ML ~~LOC~~ SOLN
4.5000 [IU] | Freq: Three times a day (TID) | SUBCUTANEOUS | Status: DC
  Administered 2014-02-14 (×2): 4.5 [IU] via SUBCUTANEOUS

## 2014-02-13 MED ORDER — IBUPROFEN 600 MG PO TABS
600.0000 mg | ORAL_TABLET | Freq: Four times a day (QID) | ORAL | Status: DC | PRN
  Administered 2014-02-13: 600 mg via ORAL
  Filled 2014-02-13: qty 1

## 2014-02-13 MED ORDER — NALOXONE HCL 0.4 MG/ML IJ SOLN: 0.4000 mg | INTRAMUSCULAR | Status: DC | PRN

## 2014-02-13 MED ORDER — METHYLDOPA 250 MG PO TABS
250.0000 mg | ORAL_TABLET | Freq: Every day | ORAL | Status: DC
  Administered 2014-02-13: 250 mg via ORAL
  Filled 2014-02-13 (×2): qty 1

## 2014-02-13 MED ORDER — ONDANSETRON HCL 4 MG/2ML IJ SOLN: 4.0000 mg | Freq: Four times a day (QID) | INTRAMUSCULAR | Status: DC | PRN

## 2014-02-13 MED ORDER — LORAZEPAM 2 MG/ML IJ SOLN: 4.0000 mg | INTRAMUSCULAR | Status: DC | PRN

## 2014-02-13 MED ORDER — HYDROMORPHONE 0.3 MG/ML IV SOLN
INTRAVENOUS | Status: DC
  Filled 2014-02-13: qty 25

## 2014-02-13 MED ORDER — LORAZEPAM 2 MG/ML IJ SOLN
INTRAMUSCULAR | Status: AC
  Filled 2014-02-13: qty 1

## 2014-02-13 MED ORDER — ONDANSETRON HCL 4 MG/2ML IJ SOLN
INTRAMUSCULAR | Status: AC
  Filled 2014-02-13: qty 2

## 2014-02-13 MED ORDER — MENTHOL 3 MG MT LOZG: 1.0000 | LOZENGE | OROMUCOSAL | Status: DC | PRN

## 2014-02-13 MED ORDER — CEFAZOLIN SODIUM-DEXTROSE 2-3 GM-% IV SOLR
INTRAVENOUS | Status: AC
  Administered 2014-02-13: 2 g via INTRAVENOUS
  Filled 2014-02-13: qty 50

## 2014-02-13 SURGICAL SUPPLY — 36 items
BLADE SURG 11 STRL SS (BLADE) ×2 IMPLANT
BLADE SURG 15 STRL LF C SS BP (BLADE) ×1 IMPLANT
BLADE SURG 15 STRL SS (BLADE) ×1
CANISTER SUCT 3000ML (MISCELLANEOUS) ×2 IMPLANT
CATH FOLEY 2WAY SLVR  5CC 18FR (CATHETERS) ×2
CATH FOLEY 2WAY SLVR 5CC 18FR (CATHETERS) ×2 IMPLANT
CLOTH BEACON ORANGE TIMEOUT ST (SAFETY) ×2 IMPLANT
DECANTER SPIKE VIAL GLASS SM (MISCELLANEOUS) ×4 IMPLANT
DERMABOND ADVANCED (GAUZE/BANDAGES/DRESSINGS) ×1
DERMABOND ADVANCED .7 DNX12 (GAUZE/BANDAGES/DRESSINGS) ×1 IMPLANT
DRAPE HYSTEROSCOPY (DRAPE) ×2 IMPLANT
DRSG COVADERM PLUS 2X2 (GAUZE/BANDAGES/DRESSINGS) IMPLANT
GAUZE PACKING 2X5 YD STRL (GAUZE/BANDAGES/DRESSINGS) ×2 IMPLANT
GAUZE SPONGE 4X4 16PLY XRAY LF (GAUZE/BANDAGES/DRESSINGS) IMPLANT
GLOVE BIO SURGEON STRL SZ7.5 (GLOVE) ×2 IMPLANT
GLOVE BIOGEL PI IND STRL 7.5 (GLOVE) ×1 IMPLANT
GLOVE BIOGEL PI INDICATOR 7.5 (GLOVE) ×1
GOWN STRL REUS W/TWL LRG LVL3 (GOWN DISPOSABLE) ×2 IMPLANT
GOWN STRL REUS W/TWL XL LVL3 (GOWN DISPOSABLE) ×2 IMPLANT
NEEDLE HYPO 22GX1.5 SAFETY (NEEDLE) ×2 IMPLANT
NEEDLE SPNL 22GX3.5 QUINCKE BK (NEEDLE) IMPLANT
NS IRRIG 1000ML POUR BTL (IV SOLUTION) ×2 IMPLANT
PACK VAGINAL WOMENS (CUSTOM PROCEDURE TRAY) ×2 IMPLANT
SET CYSTO W/LG BORE CLAMP LF (SET/KITS/TRAYS/PACK) ×2 IMPLANT
SLING TRANS VAGINAL TAPE (Sling) ×1 IMPLANT
SLING UTERINE/ABD GYNECARE TVT (Sling) ×1 IMPLANT
SUT MNCRL AB 3-0 PS2 27 (SUTURE) IMPLANT
SUT MNCRL AB 4-0 PS2 18 (SUTURE) IMPLANT
SUT VIC AB 0 CT1 27 (SUTURE) ×1
SUT VIC AB 0 CT1 27XBRD ANBCTR (SUTURE) ×1 IMPLANT
SUT VIC AB 2-0 SH 27 (SUTURE) ×8
SUT VIC AB 2-0 SH 27XBRD (SUTURE) ×8 IMPLANT
TOWEL OR 17X24 6PK STRL BLUE (TOWEL DISPOSABLE) ×4 IMPLANT
TRAY FOLEY CATH 14FR (SET/KITS/TRAYS/PACK) ×2 IMPLANT
WATER STERILE IRR 1000ML POUR (IV SOLUTION) ×2 IMPLANT
YANKAUER SUCT BULB TIP NO VENT (SUCTIONS) ×2 IMPLANT

## 2014-02-13 NOTE — Anesthesia Preprocedure Evaluation (Addendum)
Anesthesia Evaluation  Patient identified by MRN, date of birth, ID band Patient awake    Reviewed: Allergy & Precautions, H&P , NPO status , Patient's Chart, lab work & pertinent test results, reviewed documented beta blocker date and time   History of Anesthesia Complications (+) PONVNegative for: history of anesthetic complications  Airway Mallampati: I TM Distance: >3 FB Neck ROM: full    Dental  (+) Teeth Intact   Pulmonary neg sleep apnea, PE: H/o multiple PEs last was 6/13 - has been on xarelto - last dose yesterday. breath sounds clear to auscultation  Pulmonary exam normal       Cardiovascular Exercise Tolerance: Good hypertension, On Medications DVT Rhythm:regular Rate:Normal    TTE 8/13 - EF 55-60%, normal echo    Neuro/Psych  Headaches, Seizures -,  PSYCHIATRIC DISORDERS (major depression - h/o suicide attempt in 2009) Syncopal episodes TIA (6/13) Neuromuscular disease (left leg neuropathy - numbness and tingling, also left arm)    GI/Hepatic negative GI ROS, Neg liver ROS,   Endo/Other  diabetes, Type 2, Insulin DependentMorbid obesity  Renal/GU   Female GU complaint     Musculoskeletal   Abdominal   Peds  Hematology negative hematology ROS (+)   Anesthesia Other Findings   Reproductive/Obstetrics negative OB ROS                         Anesthesia Physical  Anesthesia Plan  ASA: III  Anesthesia Plan: General   Post-op Pain Management:    Induction:   Airway Management Planned: LMA  Additional Equipment:   Intra-op Plan:   Post-operative Plan:   Informed Consent: I have reviewed the patients History and Physical, chart, labs and discussed the procedure including the risks, benefits and alternatives for the proposed anesthesia with the patient or authorized representative who has indicated his/her understanding and acceptance.   Dental Advisory Given  Plan  Discussed with: CRNA and Surgeon  Anesthesia Plan Comments:         Anesthesia Quick Evaluation

## 2014-02-13 NOTE — Anesthesia Postprocedure Evaluation (Signed)
  Anesthesia Post-op Note Anesthesia Post Note  Patient: Makayla Frazier  Procedure(s) Performed: Procedure(s) (LRB): TRANSVAGINAL TAPE (TVT) PROCEDURE (N/A)  Anesthesia type: General  Patient location: PACU  Post pain: Pain level controlled  Post assessment: Post-op Vital signs reviewed  Last Vitals:  Filed Vitals:   02/13/14 1315  BP: 101/66  Pulse: 102  Temp: 36.7 C  Resp: 38    Post vital signs: Reviewed  Level of consciousness: sedated  Complications: No apparent anesthesia complications

## 2014-02-13 NOTE — Preoperative (Signed)
Beta Blockers   Reason not to administer Beta Blockers:Not Applicable 

## 2014-02-13 NOTE — H&P (View-Only) (Signed)
Makayla Frazier is a 43 y.o.  G 0  female who is S/P hyperectomy presents for placement of tension-free vaginal tape because of stress urinary incontinence.  For the past 6 months the patient has noticed leaking of urine with laughing, coughing or sneezing.  She goes on to report a sensation of incomplete emptying and post void dribbling.  She denies dysuria, urgency, retention,  flank pain, fever, dyspareunia, pelvic pain  or changes in bowel function. In November 2014 the patient underwent urodynamic testing (Lumax) and was found to have stress urinary incontinence.  A review of both medical and surgical management options were given to the patient however, she prefers to proceed with surgical management in the form of placement of tension-free vaginal tape.   Past Medical History  OB History: G0P0   GYN History: menarche: 43 YO    LMP: 12/18/2012;    Contracepton Hysterectomy  The patient reports a past history of: herpes and HPV.  Remote History of abnormal PAP smear as a teen that was treated with cryotherapy;   Last PAP smear: 7/1/2-13  Medical History: Thyroid Disease, Hypertension, Diabetes Mellitus, Vitamin D. Deficiency, Deep Vein Thrombosis/Pulmonary Embolism, Anemia, Transient Ischemic Attack,  Depression (suicide attempt 2009),  Asthma, Sleep Apnea, Foot Neuropathy and Stress Urinary Incontinence.  Surgical History: 1999 Cholecystectomy; 2011 Breast Reduction;  2007 & 2013 D & C; 2014 Laparoscopically Assisted Vaginal Hysterectomy/Bilateral Salpingectomy Denies problems with anesthesia or history of blood transfusions  Family History: Ulcerative Colitis, Skin/Bone Cancer, Hypertension, Diabetes Mellitus, Pulmonary Embolism, Breast Cancer  Social History: Single and unemployed; Denies tobacco use but does consume alcohol occasionally   Medications:   Xarelto 20 mg daily Amlodipine 5 mg  daily Asmanex Twisthaler 220 mcg  Aspirin daily Atorvastatin 10 mg daily Invokana 300 mg  daily Lantus 100 units/mL,  3 mL Pen Levemir 100 units/mL  SQ Lisinopril 20 mg/HCTZ 12.5 mg daily Loratidine 10 mg ProAir HFA 90 mcg Inhaler Symbicort 80 mcg-4.5 mcg HFA   Allergies  Allergen Reactions  . Codeine Nausea And Vomiting       . Darvocet [Propoxyphene N-Acetaminophen] Nausea And Vomiting    vomiting  . Hydrocodone-Acetaminophen Nausea And Vomiting    Vomiting   . Oxycodone-Acetaminophen Nausea And Vomiting    vomiting  . Percocet [Oxycodone-Acetaminophen] Nausea And Vomiting    vomiting  . Latex Rash    rash   ROS: Admits to glasses and constipation;  Denies headache, vision changes, nasal congestion, dysphagia, tinnitus, dizziness, hoarseness, cough,  chest pain, shortness of breath, nausea, vomiting, diarrhea,  urinary frequency, urgency  dysuria, hematuria, vaginitis symptoms, pelvic pain, swelling of joints,easy bruising,  myalgias, arthralgias, skin rashes, unexplained weight loss and except as is mentioned in the history of present illness, patient's review of systems is otherwise negative.   Denies sensitivity to peanuts, shellfish, soy, or adhesives.   Physical Exam  Bp: 114/78   P: 80    R: 14   Temperature: 98.2 degrees F orally  Weight: 175 lbs.  Height: 5 '   BMI: 34.2  Neck: supple without masses or thyromegaly Lungs: clear to auscultation Heart: regular rate and rhythm Abdomen: soft, non-tender and no organomegaly Pelvic:EGBUS- wnl; vagina-normal rugae with small cystocele; uterus/cervix-surgically absent, adnexae-no tenderness or masses Extremities:  no clubbing, cyanosis or edema   Assesment:  Stress Urinary Incontinence             Small Cystocele   Disposition:  A discussion was held with patient regarding the indication   for her procedure(s) along with the risks, which include but are not limited to: reaction to anesthesia, damage to adjacent organs, infection, urinary retention, erosion of tension free vaginal tape and excessive bleeding.   The patient verbalized understanding of these risks and has consented to proceed with Placement of Tension-Free Vaginal Tape and Cystoscopy at [facility] on February 13, 2014 at 9 a.m.   CSN# [id number]   Zaiden Ludlum J. Florene Glen, PA-C  for Dr. Harvie Bridge. Mancel Bale

## 2014-02-13 NOTE — Plan of Care (Signed)
Problem: Phase I Progression Outcomes Goal: Other Phase I Outcomes/Goals Outcome: Progressing No seizures

## 2014-02-13 NOTE — Op Note (Signed)
Preop Diagnosis: SUI  Postop Diagnosis: SUI  Procedure:1.TVT 2. Cystoscopy  Fluids: see flowsheet  EBL: Minimal  UOP: see flowsheet  Complications:none  Procedure:The patient was taken to the operating room after the risks, benefits and alternatives were discussed with patient, the patient verbalized understanding and consent signed and witnessed. The patient was placed under general anesthesia and prepped and draped in the normal sterile fashion in the dorsal lithotomy position. A weighted speculum was placed in the patient's vagina and the anterior vaginal wall was injected with dilute pitressin at a concentration of 20 units of pitressin in a total of 100cc of normal saline.  An incision was made in the anterior wall of the vagina for approximately 1cm beneath the midurethra and the underlying tissue was dissected away from the anterior vaginal wall down to the level of the lower symphysis pubis bilaterally. Attention was then turned to the mons pubis where two 5 mm incisions were made 2 fingerbreadths from the midline. The transabdominal guide was then passed through the mons pubis incision on the patient's right down through the space of Retzius and out through the anterior vaginal wall after deflecting the rigid urethral catheter guide to the ipsilateral side. The same was done on the contralateral side. Cystoscopy was performed and no invadvertant bladder injury was noted. The bladder was drained with a Foley while deflecting the rigid urethral catheter guide to the patient's right and the mesh was attached to the transabdominal guide and elevated up through the space of Retzius and out through the incision on the mons pubis on the ipsilateral side. The same was done on the contralateral side. Cystoscopy was performed again and no inadvertant bladder injury was noted. The 2 French Foley was left in the urethra and a large Claiborne Billings was placed between the urethra and the mesh in order to leave the  mesh slack beneath the midurethra. The mesh was then cut flush with the skin at the mons pubis incisions bilaterally. Indigo carmine had been administered, cystoscopy was performed again and bilateral ureters were noted to efflux without difficulty. The bilateral incisions on the mons pubis were then cleaned and Dermabond applied. The anterior vaginal wall incision was repaired with 2-0 vicryl with interrupted stitches.  Areas bilaterally noted to have gone through vaginal mucosa and this area was then undermined so that the mesh could be covered with mucosa throughout and stitched with 2-0 vicryl. Vagina was packed with estrogen soaked vaginal packing.  Sponge, lap and needle count was correct.  The patient tolerated the procedure well and was returned to the recovery room in good condition.

## 2014-02-13 NOTE — Interval H&P Note (Signed)
History and Physical Interval Note:  02/13/2014 9:07 AM  Makayla Frazier  has presented today for surgery, with the diagnosis of Urinary incontinence  The various methods of treatment have been discussed with the patient and family. After consideration of risks, benefits and other options for treatment, the patient has consented to  Procedure(s): TRANSVAGINAL TAPE (TVT) PROCEDURE (N/A) and CYSTOSCOPY as a surgical intervention .  The patient's history has been reviewed, patient examined, no change in status, stable for surgery.  I have reviewed the patient's chart and labs.  Questions were answered to the patient's satisfaction.     Delice Lesch

## 2014-02-13 NOTE — Transfer of Care (Signed)
Immediate Anesthesia Transfer of Care Note  Patient: Makayla Frazier  Procedure(s) Performed: Procedure(s): TRANSVAGINAL TAPE (TVT) PROCEDURE (N/A)  Patient Location: PACU  Anesthesia Type:General  Level of Consciousness: awake, alert  and oriented  Airway & Oxygen Therapy: Patient Spontanous Breathing and Patient connected to nasal cannula oxygen  Post-op Assessment: Report given to PACU RN, Post -op Vital signs reviewed and stable and Patient moving all extremities  Post vital signs: Reviewed and stable  Complications: No apparent anesthesia complications

## 2014-02-14 DIAGNOSIS — N393 Stress incontinence (female) (male): Secondary | ICD-10-CM | POA: Diagnosis not present

## 2014-02-14 LAB — CBC
HCT: 35.1 % — ABNORMAL LOW (ref 36.0–46.0)
Hemoglobin: 11.6 g/dL — ABNORMAL LOW (ref 12.0–15.0)
MCH: 25.1 pg — ABNORMAL LOW (ref 26.0–34.0)
MCHC: 33 g/dL (ref 30.0–36.0)
MCV: 76 fL — AB (ref 78.0–100.0)
Platelets: 308 10*3/uL (ref 150–400)
RBC: 4.62 MIL/uL (ref 3.87–5.11)
RDW: 14.8 % (ref 11.5–15.5)
WBC: 15.9 10*3/uL — ABNORMAL HIGH (ref 4.0–10.5)

## 2014-02-14 LAB — HEMOGLOBIN A1C
Hgb A1c MFr Bld: 10.7 % — ABNORMAL HIGH (ref <5.7)
Mean Plasma Glucose: 260 mg/dL — ABNORMAL HIGH (ref <117)

## 2014-02-14 LAB — BASIC METABOLIC PANEL
BUN: 10 mg/dL (ref 6–23)
CALCIUM: 8.7 mg/dL (ref 8.4–10.5)
CO2: 26 meq/L (ref 19–32)
CREATININE: 0.67 mg/dL (ref 0.50–1.10)
Chloride: 104 mEq/L (ref 96–112)
GFR calc Af Amer: >90 mL/min (ref >90)
GFR calc non Af Amer: >90 mL/min (ref >90)
Glucose, Bld: 90 mg/dL (ref 70–99)
Potassium: 3.9 mEq/L (ref 3.7–5.3)
Sodium: 139 mEq/L (ref 137–147)

## 2014-02-14 LAB — GLUCOSE, CAPILLARY
Glucose-Capillary: 84 mg/dL (ref 70–99)
Glucose-Capillary: 95 mg/dL (ref 70–99)

## 2014-02-14 MED ORDER — HYDROMORPHONE HCL 2 MG PO TABS: 2.0000 mg | ORAL_TABLET | Freq: Four times a day (QID) | ORAL | Status: DC | PRN

## 2014-02-14 MED ORDER — IBUPROFEN 600 MG PO TABS: 600.0000 mg | ORAL_TABLET | Freq: Four times a day (QID) | ORAL | Status: DC | PRN

## 2014-02-14 NOTE — Addendum Note (Signed)
Addendum created 02/14/14 5997 by Talbot Grumbling, CRNA   Modules edited: Notes Section   Notes Section:  File: [id number]

## 2014-02-14 NOTE — Anesthesia Postprocedure Evaluation (Signed)
Anesthesia Post Note  Patient: Makayla Frazier  Procedure(s) Performed: Procedure(s) (LRB): TRANSVAGINAL TAPE (TVT) PROCEDURE (N/A)  Anesthesia type: General  Patient location: Mother/Baby  Post pain: Pain level controlled  Post assessment: Post-op Vital signs reviewed  Last Vitals:  Filed Vitals:   02/14/14 0800  BP: 116/78  Pulse: 86  Temp: 37 C  Resp: 16    Post vital signs: Reviewed  Level of consciousness: awake and alert   Complications: No apparent anesthesia complications

## 2014-02-14 NOTE — Progress Notes (Signed)
Vaginal packing removed as ordered.  Small amount blood-tinged drainage noted on packing.  Patient tolerated well.  Ambulated to BR, peri care done, tolerated well.  Foley cath d/c'd as ordered

## 2014-02-14 NOTE — Progress Notes (Signed)
Pt discharged home with significant other. Pt without complaints and verbalizes understanding of discharge instructions. VSS. Pt ambulatory.

## 2014-02-14 NOTE — Discharge Summary (Signed)
Physician Discharge Summary  Patient ID: Makayla Frazier MRN: [id number] DOB/AGE: 18-Dec-1970 43 y.o.  Admit date: 02/13/2014 Discharge date: 02/14/2014  Admission Diagnoses: SUI  H/o seizures  Discharge Diagnoses:  Active Problems:   Incontinence s/p TVT  Discharged Condition: good  Hospital Course: Patient admitted for TVT/cystoscopyl which went well but patient had 3 seizures in recovery room ultimately controlledwith ativan.  Pt did well post operatively other than the seizures.  Voided well post op, tolerating po, ambulating well and ready for discharge.  Pt to resume xarelto and she has all her meds at home.  Consults: n/a  Significant Diagnostic Studies: n/a  Treatments: surgery and observation  Discharge Exam: Blood pressure 115/77, pulse 85, temperature 98.6 F (37 C), temperature source Oral, resp. rate 18, height 5' (1.524 m), weight 77.574 kg (171 lb 0.3 oz), last menstrual period 12/19/2011, SpO2 97.00%. General appearance: alert and no distress Resp: clear to auscultation bilaterally Cardio: regular rate and rhythm Extremities: Homans sign is negative, no sign of DVT No vaginal bleeding  Disposition: 01-Home or Self Care     Medication List    TAKE these medications       albuterol 108 (90 BASE) MCG/ACT inhaler  Commonly known as:  PROVENTIL HFA;VENTOLIN HFA  Inhale 2 puffs into the lungs every 6 (six) hours as needed for wheezing or shortness of breath.     amLODipine 10 MG tablet  Commonly known as:  NORVASC  Take 10 mg by mouth at bedtime.     fluconazole 150 MG tablet  Commonly known as:  DIFLUCAN  Take 1 tablet (150 mg total) by mouth once.     HYDROmorphone 2 MG tablet  Commonly known as:  DILAUDID  Take 1 tablet (2 mg total) by mouth every 6 (six) hours as needed for moderate pain or severe pain.     ibuprofen 600 MG tablet  Commonly known as:  ADVIL,MOTRIN  Take 1 tablet (600 mg total) by mouth every 6 (six) hours as needed (mild pain).      ibuprofen 600 MG tablet  Commonly known as:  ADVIL,MOTRIN  Take 1 tablet (600 mg total) by mouth every 6 (six) hours as needed.     INVOKANA 300 MG Tabs  Generic drug:  Canagliflozin  Take 300 mg by mouth daily.     lisinopril 10 MG tablet  Commonly known as:  PRINIVIL,ZESTRIL  Take 30 mg by mouth at bedtime.     methyldopa 250 MG tablet  Commonly known as:  ALDOMET  Take 250 mg by mouth daily.     SYMBICORT IN  Inhale 1 puff into the lungs daily.     topiramate 25 MG tablet  Commonly known as:  TOPAMAX  Take 25 mg by mouth daily.     XARELTO 20 MG Tabs tablet  Generic drug:  Rivaroxaban  Take 20 mg by mouth at bedtime.      ASK your doctor about these medications       LEVEMIR FLEXPEN [place]  Inject 40 Units into the skin at bedtime.     NOVOLOG FLEXPEN [place]  Inject 9 Units into the skin 3 (three) times daily with meals as needed. Takes 9 units 3 times a day if blood sugar is over 200           Follow-up Information   Follow up with Delice Lesch, MD In 6 weeks. (for post op appt)    Specialty:  Obstetrics and Gynecology   Contact information:  [address] [address] [phone]       Signed: Delice Lesch 02/14/2014, 2:04 PM

## 2014-02-16 ENCOUNTER — Encounter (HOSPITAL_COMMUNITY): Payer: Self-pay | Admitting: Obstetrics and Gynecology

## 2014-02-25 DIAGNOSIS — R079 Chest pain, unspecified: Secondary | ICD-10-CM

## 2014-02-25 DIAGNOSIS — E119 Type 2 diabetes mellitus without complications: Secondary | ICD-10-CM | POA: Insufficient documentation

## 2014-02-25 DIAGNOSIS — M129 Arthropathy, unspecified: Secondary | ICD-10-CM | POA: Insufficient documentation

## 2014-02-25 DIAGNOSIS — G43909 Migraine, unspecified, not intractable, without status migrainosus: Secondary | ICD-10-CM | POA: Insufficient documentation

## 2014-02-25 DIAGNOSIS — G8929 Other chronic pain: Secondary | ICD-10-CM | POA: Insufficient documentation

## 2014-02-25 DIAGNOSIS — Z86711 Personal history of pulmonary embolism: Secondary | ICD-10-CM | POA: Insufficient documentation

## 2014-02-25 DIAGNOSIS — G4733 Obstructive sleep apnea (adult) (pediatric): Secondary | ICD-10-CM | POA: Insufficient documentation

## 2014-02-25 DIAGNOSIS — E1165 Type 2 diabetes mellitus with hyperglycemia: Secondary | ICD-10-CM | POA: Insufficient documentation

## 2014-02-25 DIAGNOSIS — E785 Hyperlipidemia, unspecified: Secondary | ICD-10-CM | POA: Insufficient documentation

## 2014-03-19 ENCOUNTER — Other Ambulatory Visit: Payer: Self-pay

## 2014-03-27 DIAGNOSIS — R32 Unspecified urinary incontinence: Secondary | ICD-10-CM | POA: Insufficient documentation

## 2014-04-04 ENCOUNTER — Inpatient Hospital Stay (HOSPITAL_COMMUNITY)
Admission: AD | Admit: 2014-04-04 | Discharge: 2014-04-04 | Disposition: A | Discharge status: Home / Self Care | Payer: Medicare Other | Source: Ambulatory Visit | Attending: Obstetrics and Gynecology | Admitting: Obstetrics and Gynecology

## 2014-04-04 ENCOUNTER — Encounter (HOSPITAL_COMMUNITY): Payer: Self-pay | Admitting: *Deleted

## 2014-04-04 DIAGNOSIS — A499 Bacterial infection, unspecified: Secondary | ICD-10-CM | POA: Insufficient documentation

## 2014-04-04 DIAGNOSIS — E876 Hypokalemia: Secondary | ICD-10-CM | POA: Insufficient documentation

## 2014-04-04 DIAGNOSIS — N949 Unspecified condition associated with female genital organs and menstrual cycle: Secondary | ICD-10-CM | POA: Insufficient documentation

## 2014-04-04 DIAGNOSIS — R29898 Other symptoms and signs involving the musculoskeletal system: Secondary | ICD-10-CM | POA: Insufficient documentation

## 2014-04-04 DIAGNOSIS — B9689 Other specified bacterial agents as the cause of diseases classified elsewhere: Secondary | ICD-10-CM | POA: Insufficient documentation

## 2014-04-04 DIAGNOSIS — Z86711 Personal history of pulmonary embolism: Secondary | ICD-10-CM | POA: Insufficient documentation

## 2014-04-04 DIAGNOSIS — Z8673 Personal history of transient ischemic attack (TIA), and cerebral infarction without residual deficits: Secondary | ICD-10-CM | POA: Insufficient documentation

## 2014-04-04 DIAGNOSIS — R109 Unspecified abdominal pain: Secondary | ICD-10-CM | POA: Insufficient documentation

## 2014-04-04 DIAGNOSIS — N760 Acute vaginitis: Secondary | ICD-10-CM | POA: Insufficient documentation

## 2014-04-04 DIAGNOSIS — E119 Type 2 diabetes mellitus without complications: Secondary | ICD-10-CM | POA: Insufficient documentation

## 2014-04-04 DIAGNOSIS — I10 Essential (primary) hypertension: Secondary | ICD-10-CM | POA: Insufficient documentation

## 2014-04-04 DIAGNOSIS — Z90710 Acquired absence of both cervix and uterus: Secondary | ICD-10-CM | POA: Insufficient documentation

## 2014-04-04 LAB — CBC
HCT: 41.7 % (ref 36.0–46.0)
Hemoglobin: 13.7 g/dL (ref 12.0–15.0)
MCH: 25.5 pg — ABNORMAL LOW (ref 26.0–34.0)
MCHC: 32.9 g/dL (ref 30.0–36.0)
MCV: 77.5 fL — AB (ref 78.0–100.0)
PLATELETS: 312 10*3/uL (ref 150–400)
RBC: 5.38 MIL/uL — AB (ref 3.87–5.11)
RDW: 15.1 % (ref 11.5–15.5)
WBC: 9.7 10*3/uL (ref 4.0–10.5)

## 2014-04-04 LAB — COMPREHENSIVE METABOLIC PANEL
ALT: 18 U/L (ref 0–35)
AST: 12 U/L (ref 0–37)
Albumin: 3.5 g/dL (ref 3.5–5.2)
Alkaline Phosphatase: 77 U/L (ref 39–117)
BUN: 11 mg/dL (ref 6–23)
CO2: 25 mEq/L (ref 19–32)
CREATININE: 0.63 mg/dL (ref 0.50–1.10)
Calcium: 9.4 mg/dL (ref 8.4–10.5)
Chloride: 96 mEq/L (ref 96–112)
GFR calc Af Amer: >90 mL/min (ref >90)
Glucose, Bld: 314 mg/dL — ABNORMAL HIGH (ref 70–99)
Potassium: 4 mEq/L (ref 3.7–5.3)
Sodium: 135 mEq/L — ABNORMAL LOW (ref 137–147)
Total Bilirubin: <0.2 mg/dL — ABNORMAL LOW (ref 0.3–1.2)
Total Protein: 7.3 g/dL (ref 6.0–8.3)

## 2014-04-04 LAB — URINE MICROSCOPIC-ADD ON

## 2014-04-04 LAB — URINALYSIS, ROUTINE W REFLEX MICROSCOPIC
Bilirubin Urine: NEGATIVE
Glucose, UA: >1000 mg/dL — AB
Hgb urine dipstick: NEGATIVE
KETONES UR: NEGATIVE mg/dL
LEUKOCYTES UA: NEGATIVE
Nitrite: NEGATIVE
PH: 5 (ref 5.0–8.0)
Protein, ur: 30 mg/dL — AB
Specific Gravity, Urine: 1.01 (ref 1.005–1.030)
Urobilinogen, UA: 0.2 mg/dL (ref 0.0–1.0)

## 2014-04-04 LAB — WET PREP, GENITAL
Trich, Wet Prep: NONE SEEN
Yeast Wet Prep HPF POC: NONE SEEN

## 2014-04-04 LAB — POCT PREGNANCY, URINE: PREG TEST UR: NEGATIVE

## 2014-04-04 MED ORDER — IBUPROFEN 600 MG PO TABS
600.0000 mg | ORAL_TABLET | Freq: Once | ORAL | Status: AC
  Administered 2014-04-04: 600 mg via ORAL
  Filled 2014-04-04: qty 1

## 2014-04-04 MED ORDER — CLINDAMYCIN HCL 300 MG PO CAPS: 300.0000 mg | ORAL_CAPSULE | Freq: Two times a day (BID) | ORAL | Status: DC

## 2014-04-04 MED ORDER — CLINDAMYCIN HCL 300 MG PO CAPS
300.0000 mg | ORAL_CAPSULE | Freq: Once | ORAL | Status: AC
  Administered 2014-04-04: 300 mg via ORAL
  Filled 2014-04-04: qty 1

## 2014-04-04 NOTE — MAU Note (Signed)
Pt had surgery for bladder repair with mesh on March 6th; Pt reports pelvic  pain for the past several weeks since surgery and vaginal burning that started today. Pt adhering to d/c instructions re activity.  Pain medication does not seem to help very much.

## 2014-04-04 NOTE — MAU Provider Note (Signed)
History   43yo female presents with c/o vaginal burning for the past 2 days getting much worse today and lower abdominal cramping since her last surgery.  S/p Bladder suspension 02/2014, and hysterectomy 12/2012.  Denies VB,  recent fever, resp or GI c/o's.   Patient Active Problem List   Diagnosis Date Noted  . Incontinence 02/13/2014  . S/P vaginal hysterectomy 01/29/2013  . BV (bacterial vaginosis) 12/28/2012  . TIA (transient ischemic attack) 08/03/2012  . Syncope 08/03/2012  . Left-sided weakness 08/03/2012  . Hypokalemia 08/03/2012  . Blood glucose elevated 08/03/2012  . Chest pain 08/03/2012  . Pulmonary embolism 05/18/2012  . Patellar tendinitis of left knee 08/21/2011  . Knee pain, left 07/07/2011  . PRURITUS 07/04/2010  . Syncope and collapse 10/18/2009  . ANEMIA 07/10/2008  . HYPERTENSION 07/08/2008  . DIABETES MELLITUS, TYPE II 06/11/2008  . NEUROPATHY 01/22/2008  . ASTHMA 01/22/2008    No chief complaint on file.  HPI  OB History   Grav Para Term Preterm Abortions TAB SAB Ect Mult Living   0 0              Past Medical History  Diagnosis Date  . PE (pulmonary embolism)     2009 - on oral contraception; multiple  . Syncopal episodes     unknown etiology  . Dysfunctional uterine bleeding     Seen by Dr. Leo Grosser, GYN; pending hysterectomy as of 07/2012  . Asthma   . Obesity   . Mastodynia   . Hypertension   . Post - coital bleeding 2012  . Labial lesion 2013  . BV (bacterial vaginosis) 2012  . Oligomenorrhea 2011  . DVT (deep venous thrombosis) 2011    pt had IUD; also 05/2012  . H/O hypercoagulable state     2/2 contraception  . HLD (hyperlipidemia)   . DM (diabetes mellitus)     diagnosed 2009  . Anemia   . Herpes simplex without mention of complication 0/6237    HSV-2  . Thyroid disease     hypothyroidism  . Vitamin D deficiency   . Major depression     Hx suicide attempt in 2009, [facility] admissions  . Venous insufficiency   . Sleep apnea   .  Neuropathy   . Stroke     TIA 2013  . Seizures 2014  . Headache(784.0)     migraines  . Neuromuscular disorder     neuropathy  . Arthritis     Left leg  . Status post placement of implantable loop recorder     Past Surgical History  Procedure Laterality Date  . Breast reduction surgery      Dr. Eugene Garnet [facility] 2011  . Cholecystectomy    . Cardiac electrophysiology study & dft      Implantable Loop recorder; no arrhythmias associated with synocpal spells; loop explanted 07-2013  . Endometrial biopsy  2011  . Dilatation & currettage/hysteroscopy with resectocope  2007 & 2013  . Laparoscopic assisted vaginal hysterectomy  12/18/2012    Procedure: LAPAROSCOPIC ASSISTED VAGINAL HYSTERECTOMY;  Surgeon: Eldred Manges, MD;  Location: [facility];  Service: Gynecology;  Laterality: N/A;  . Iud removal  12/18/2012    Procedure: INTRAUTERINE DEVICE (IUD) REMOVAL;  Surgeon: Eldred Manges, MD;  Location: [facility];  Service: Gynecology;  Laterality: N/A;  Removed during prep by E. Florene Glen PA  . Bilateral salpingectomy  12/18/2012    Procedure: BILATERAL SALPINGECTOMY;  Surgeon: Eldred Manges, MD;  Location: [facility];  Service: Gynecology;  Laterality: Bilateral;  . Abdominal hysterectomy    . Bilateral oophorectomy    . Bladder suspension N/A 02/13/2014    Procedure: TRANSVAGINAL TAPE (TVT) PROCEDURE;  Surgeon: Delice Lesch, MD;  Location: [facility];  Service: Gynecology;  Laterality: N/A;    Family History  Problem Relation Age of Onset  . Melanoma Father 52  . Ulcerative colitis Mother 34  . Breast cancer Paternal Grandmother   . Diabetes type II Maternal Grandmother     deceased 61  . Pulmonary embolism Paternal Grandfather     History  Substance Use Topics  . Smoking status: Never Smoker   . Smokeless tobacco: Never Used  . Alcohol Use: No    Allergies:  Allergies  Allergen Reactions  . Codeine Nausea And Vomiting       . Darvocet [Propoxyphene N-Acetaminophen] Nausea And  Vomiting    vomiting  . Hydrocodone-Acetaminophen Nausea And Vomiting    Vomiting   . Percocet [Oxycodone-Acetaminophen] Nausea And Vomiting    vomiting  . Latex Rash    rash    Prescriptions prior to admission  Medication Sig Dispense Refill  . albuterol (PROVENTIL HFA;VENTOLIN HFA) 108 (90 BASE) MCG/ACT inhaler Inhale 2 puffs into the lungs every 6 (six) hours as needed for wheezing or shortness of breath.      Marland Kitchen amLODipine (NORVASC) 10 MG tablet Take 10 mg by mouth at bedtime.       . Budesonide-Formoterol Fumarate (SYMBICORT IN) Inhale 1 puff into the lungs daily.      Marland Kitchen HYDROmorphone (DILAUDID) 2 MG tablet Take 1 tablet (2 mg total) by mouth every 6 (six) hours as needed for moderate pain or severe pain.  30 tablet  0  . ibuprofen (ADVIL,MOTRIN) 600 MG tablet Take 1 tablet (600 mg total) by mouth every 6 (six) hours as needed (mild pain).  30 tablet  0  . INVOKANA 300 MG TABS Take 300 mg by mouth daily.       Marland Kitchen lisinopril (PRINIVIL,ZESTRIL) 10 MG tablet Take 30 mg by mouth at bedtime.       . methyldopa (ALDOMET) 250 MG tablet Take 250 mg by mouth daily.      . Rivaroxaban (XARELTO) 20 MG TABS Take 20 mg by mouth at bedtime.      . topiramate (TOPAMAX) 25 MG tablet Take 25 mg by mouth daily.      Marland Kitchen ibuprofen (ADVIL,MOTRIN) 600 MG tablet Take 1 tablet (600 mg total) by mouth every 6 (six) hours as needed.  30 tablet  0    ROS ROS: see HPI above, all other systems are negative  Physical Exam   Blood pressure 177/120, temperature 98.9 F (37.2 C), temperature source Oral, resp. rate 16, height 5' (1.524 m), weight 173 lb (78.472 kg), last menstrual period 12/19/2011.  Physical Exam  Constitutional: She is oriented to person, place, and time. She appears well-developed and well-nourished.  Cardiovascular: Normal rate and regular rhythm.   Respiratory: Effort normal.  GI: Soft. Bowel sounds are normal.  Neurological: She is alert and oriented to person, place, and time.  Skin:  Skin is warm and dry.  Psychiatric: She has a normal mood and affect. Her behavior is normal.  Pelvic exam: VULVA: normal appearing vulva with no masses, tenderness or lesions, VAGINA: normal appearing vagina with normal color and discharge, no lesions, pain noted anteriorly, exam chaperoned by RN.  Results for orders placed during the hospital encounter of 04/04/14 (from the past  24 hour(s))  WET PREP, GENITAL     Status: Abnormal   Collection Time    04/04/14  6:20 PM      Result Value Ref Range   Yeast Wet Prep HPF POC NONE SEEN  NONE SEEN   Trich, Wet Prep NONE SEEN  NONE SEEN   Clue Cells Wet Prep HPF POC FEW (*) NONE SEEN   WBC, Wet Prep HPF POC FEW (*) NONE SEEN  CBC     Status: Abnormal   Collection Time    04/04/14  6:20 PM      Result Value Ref Range   WBC 9.7  4.0 - 10.5 K/uL   RBC 5.38 (*) 3.87 - 5.11 MIL/uL   Hemoglobin 13.7  12.0 - 15.0 g/dL   HCT 41.7  36.0 - 46.0 %   MCV 77.5 (*) 78.0 - 100.0 fL   MCH 25.5 (*) 26.0 - 34.0 pg   MCHC 32.9  30.0 - 36.0 g/dL   RDW 15.1  11.5 - 15.5 %   Platelets 312  150 - 400 K/uL  COMPREHENSIVE METABOLIC PANEL     Status: Abnormal   Collection Time    04/04/14  6:20 PM      Result Value Ref Range   Sodium 135 (*) 137 - 147 mEq/L   Potassium 4.0  3.7 - 5.3 mEq/L   Chloride 96  96 - 112 mEq/L   CO2 25  19 - 32 mEq/L   Glucose, Bld 314 (*) 70 - 99 mg/dL   BUN 11  6 - 23 mg/dL   Creatinine, Ser 0.63  0.50 - 1.10 mg/dL   Calcium 9.4  8.4 - 10.5 mg/dL   Total Protein 7.3  6.0 - 8.3 g/dL   Albumin 3.5  3.5 - 5.2 g/dL   AST 12  0 - 37 U/L   ALT 18  0 - 35 U/L   Alkaline Phosphatase 77  39 - 117 U/L   Total Bilirubin <0.2 (*) 0.3 - 1.2 mg/dL   GFR calc non Af Amer >90  >90 mL/min   GFR calc Af Amer >90  >90 mL/min     ED Course  43 yo female S/p Bladder suspension 02/2014 Labs - + for BV Motrin 600 mg PO once  C/w Dr. Mancel Bale - give Clindamycin 300 mg now and observe If she tolerates Clindamycin - Rx Clindamycin 300 mg Q 12  hours x 7 days F/u - pt is waiting for a call from [facility] at [facility] to schedule surgery - note sent to [facility] to ensure this is going to happen   Linda Hedges CNM, MSN 04/04/2014 6:20 PM

## 2014-04-04 NOTE — Discharge Instructions (Signed)
Clindamycin capsules What is this medicine? CLINDAMYCIN ([place] sin) is a lincosamide antibiotic. It is used to treat certain kinds of bacterial infections. It will not work for colds, flu, or other viral infections. This medicine may be used for other purposes; ask your health care provider or pharmacist if you have questions. COMMON BRAND NAME(S): Cleocin What should I tell my health care provider before I take this medicine? They need to know if you have any of these conditions: -kidney disease -liver disease -stomach problems like colitis -an unusual or allergic reaction to clindamycin, lincomycin, or other medicines, foods, dyes like tartrazine or preservatives -pregnant or trying to get pregnant -breast-feeding How should I use this medicine? Take this medicine by mouth with a full glass of water. Follow the directions on the prescription label. You can take this medicine with food or on an empty stomach. If the medicine upsets your stomach, take it with food. Take your medicine at regular intervals. Do not take your medicine more often than directed. Take all of your medicine as directed even if you think your are better. Do not skip doses or stop your medicine early. Talk to your pediatrician regarding the use of this medicine in children. Special care may be needed. Overdosage: If you think you have taken too much of this medicine contact a poison control center or emergency room at once. NOTE: This medicine is only for you. Do not share this medicine with others. What if I miss a dose? If you miss a dose, take it as soon as you can. If it is almost time for your next dose, take only that dose. Do not take double or extra doses. What may interact with this medicine? -chloramphenicol -erythromycin -kaolin products This list may not describe all possible interactions. Give your health care provider a list of all the medicines, herbs, non-prescription drugs, or dietary supplements  you use. Also tell them if you smoke, drink alcohol, or use illegal drugs. Some items may interact with your medicine. What should I watch for while using this medicine? Tell your doctor or healthcare professional if your symptoms do not start to get better or if they get worse. Do not treat diarrhea with over the counter products. Contact your doctor if you have diarrhea that lasts more than 2 days or if it is severe and watery. What side effects may I notice from receiving this medicine? Side effects that you should report to your doctor or health care professional as soon as possible: -allergic reactions like skin rash, itching or hives, swelling of the face, lips, or tongue -dark urine -pain on swallowing -redness, blistering, peeling or loosening of the skin, including inside the mouth -unusual bleeding or bruising -unusually weak or tired -yellowing of eyes or skin Side effects that usually do not require medical attention (report to your doctor or health care professional if they continue or are bothersome): -diarrhea -itching in the rectal or genital area -joint pain -nausea, vomiting -stomach pain This list may not describe all possible side effects. Call your doctor for medical advice about side effects. You may report side effects to FDA at 1-800-FDA-1088. Where should I keep my medicine? Keep out of the reach of children. Store at room temperature between 20 and 25 degrees C (68 and 77 degrees F). Throw away any unused medicine after the expiration date. NOTE: This sheet is a summary. It may not cover all possible information. If you have questions about this medicine, talk to  your doctor, pharmacist, or health care provider.  2014, Elsevier/Gold Standard. (2010-04-20 10:12:31)

## 2014-04-10 ENCOUNTER — Other Ambulatory Visit: Payer: Self-pay | Admitting: Obstetrics and Gynecology

## 2014-04-16 ENCOUNTER — Encounter (HOSPITAL_COMMUNITY): Payer: Self-pay

## 2014-04-21 ENCOUNTER — Encounter (HOSPITAL_COMMUNITY)
Admission: RE | Admit: 2014-04-21 | Discharge: 2014-04-21 | Disposition: A | Discharge status: Home / Self Care | Payer: Medicare Other | Source: Ambulatory Visit | Attending: Obstetrics and Gynecology | Admitting: Obstetrics and Gynecology

## 2014-04-21 ENCOUNTER — Encounter (HOSPITAL_COMMUNITY): Payer: Self-pay

## 2014-04-21 DIAGNOSIS — Z01812 Encounter for preprocedural laboratory examination: Secondary | ICD-10-CM | POA: Insufficient documentation

## 2014-04-21 HISTORY — DX: Other complications of anesthesia, initial encounter: T88.59XA

## 2014-04-21 HISTORY — DX: Adverse effect of unspecified anesthetic, initial encounter: T41.45XA

## 2014-04-21 LAB — BASIC METABOLIC PANEL
BUN: 7 mg/dL (ref 6–23)
CHLORIDE: 97 meq/L (ref 96–112)
CO2: 27 mEq/L (ref 19–32)
Calcium: 9.6 mg/dL (ref 8.4–10.5)
Creatinine, Ser: 0.67 mg/dL (ref 0.50–1.10)
GFR calc non Af Amer: >90 mL/min (ref >90)
Glucose, Bld: 271 mg/dL — ABNORMAL HIGH (ref 70–99)
Potassium: 4.3 mEq/L (ref 3.7–5.3)
SODIUM: 136 meq/L — AB (ref 137–147)

## 2014-04-21 LAB — CBC
HEMATOCRIT: 41.5 % (ref 36.0–46.0)
HEMOGLOBIN: 13.5 g/dL (ref 12.0–15.0)
MCH: 25.3 pg — ABNORMAL LOW (ref 26.0–34.0)
MCHC: 32.5 g/dL (ref 30.0–36.0)
MCV: 77.7 fL — ABNORMAL LOW (ref 78.0–100.0)
Platelets: 317 10*3/uL (ref 150–400)
RBC: 5.34 MIL/uL — AB (ref 3.87–5.11)
RDW: 15.3 % (ref 11.5–15.5)
WBC: 9 10*3/uL (ref 4.0–10.5)

## 2014-04-21 NOTE — Patient Instructions (Addendum)
Your procedure is scheduled on: Tuesday, Apr 28, 2014  Enter through the [employer] [facility] at: 1:45pm  Pick up the phone at the desk and dial [phone].  Call this number if you have problems the morning of surgery: [phone].  Remember: Do NOT eat food: after midnight Monday Do NOT drink clear liquids after: 9:15 am day of surgery Take these medicines the morning of surgery with a SIP OF WATER: Norvasc, Lisinopril, Aldomet, Bring asthma inhaler day of surgery *Take full Novolog dose at dinner *Take 1/2 Levemir and Novolog dose at bedtime *Stop Xarelto per doctors orders*  Do NOT wear jewelry (body piercing), metal hair clips/bobby pins, make-up, or nail polish. Do NOT wear lotions, powders, or perfumes.  You may wear deoderant. Do NOT shave for 48 hours prior to surgery. Do NOT bring valuables to the hospital. Contacts, dentures, or bridgework may not be worn into surgery. Have a responsible adult drive you home and stay with you for 24 hours after your procedure

## 2014-04-21 NOTE — Pre-Procedure Instructions (Signed)
Pt. Had two elevated blood pressure while at PAT appointment.  Referred pt to make an appointment with Dr. Jackelyn Poling Smothers to re-evaluate her blood pressure prior to surgery.  I have also called and left a message for Dr. Ezekiel Ina office to call me back to discuss pts elevated blood pressures and Xarelto management prior to surgery.  Dr. Lyndle Herrlich also informed.

## 2014-04-22 ENCOUNTER — Other Ambulatory Visit (HOSPITAL_COMMUNITY): Payer: Self-pay | Admitting: Obstetrics and Gynecology

## 2014-04-22 NOTE — H&P (Signed)
Makayla Frazier is a 43 y.o. G 0 female who is S/P hyperectomy (2014) and Placement of Tension Free Vaginal Tape (February 13, 2014) presenting for removal of vaginal mesh.  Since her bladder sling procedure in March,  the patient reports vaginal pain that is off & on along with occasional vaginal spotting.  She admits to occasional urinary urgency but no incontinence,  vaginitis symptoms,  changes in bowel function and has not had intercourse. She was treated earlier in this process with an antibiotic for bacterial vaginosis but continues to have vaginal discomfort.  Due to the persistence of her symptoms, the patient wants to have the vaginal mesh removed.   Past Medical History   OB History: G0  GYN History: menarche: 43 YO LMP: 12/18/2012; Contracepton: Hysterectomy The patient reports a past history of: herpes and HPV. Remote History of abnormal PAP smear as a teen that was treated with cryotherapy; Last PAP smear: 06/10/2012   Medical History: Thyroid Disease, Hypertension, Diabetes Mellitus, Vitamin D. Deficiency, Deep Vein Thrombosis/Pulmonary Embolism, Anemia, Transient Ischemic Attack, Depression (suicide attempt 2009), Asthma, Sleep Apnea, Foot Neuropathy and Stress Urinary Incontinence.   Surgical History: 1999 Cholecystectomy; 2011 Breast Reduction; 2007 & 2013 D & C; 2014 Laparoscopically Assisted Vaginal Hysterectomy/Bilateral Salpingectomy  2015 Placement of Tension Free Vaginal Tape/Cystoscopy Denies problems with anesthesia or history of blood transfusions   Family History: Ulcerative Colitis, Skin/Bone Cancer, Hypertension, Diabetes Mellitus, Pulmonary Embolism, Breast Cancer   Social History: Single and unemployed; Denies tobacco use but does consume alcohol occasionally   Medications:   Xarelto 20 mg daily  Amlodipine 5 mg daily  Asmanex Twisthaler 220 mcg  Aspirin daily  Atorvastatin 10 mg daily  Invokana 300 mg daily  Novolog Flex- Pen  Levemir 100 units/mL SQ  Lisinopril  20 mg/HCTZ 12.5 mg daily  Loratidine 10 mg  ProAir HFA 90 mcg Inhaler   Allergies   Allergen  Reactions   .  Codeine  Nausea And Vomiting       .  Darvocet [Propoxyphene N-Acetaminophen]  Nausea And Vomiting     vomiting   .  Hydrocodone-Acetaminophen  Nausea And Vomiting     Vomiting   .  Oxycodone-Acetaminophen  Nausea And Vomiting     vomiting   .  Percocet [Oxycodone-Acetaminophen]  Nausea And Vomiting     vomiting   .  Latex  Rash     rash     ROS: Admits to glasses and occasional constipation (made better with stool softeners) ; Denies headache, vision changes, nasal congestion, dysphagia, tinnitus, dizziness, hoarseness, cough, chest pain, shortness of breath, nausea, vomiting, diarrhea, urinary frequency, urgency dysuria, hematuria, vaginitis symptoms, pelvic pain, swelling of joints,easy bruising, myalgias, arthralgias, skin rashes, unexplained weight loss and except as is mentioned in the history of present illness, patient's review of systems is otherwise negative.   Denies sensitivity to peanuts, shellfish, soy, or adhesives.   Physical Exam   Bp: 110/78   P: 86   R: 12  Temperature: 98.1 degrees F orally    Weight: 172 lbs. Height: 5 '    BMI: 33,6  Neck: supple without masses or thyromegaly  Lungs: clear to auscultation  Heart: regular rate and rhythm  Abdomen: soft, mild supra pubic tenderness without guarding and no organomegaly  Pelvic:EGBUS- wnl; vagina-with tenderness anteriorly along mid-line with visible exposed suture but no evidence of infection or visible would disruption; uterus/cervix-surgically absent, adnexae-no tenderness or masses  Extremities: no clubbing, cyanosis or edema  Assesment: Vaginal Pain                      S/P  Placement of Tension Free Vaginal Tape   Disposition: A discussion was held with patient regarding the indication for her procedure(s) along with the risks, which include but are not limited to: reaction to anesthesia, damage  to adjacent organs, infection, urinary retention and excessive bleeding. The patient verbalized understanding of these risks and has consented to proceed with Removal  of Tension-Free Vaginal Tape and at [facility] on Apr 28, 2014 at 3:15 p.m.     Akaysha Cobern J. Florene Glen, PA-C for Dr. Harvie Bridge. Mancel Bale

## 2014-04-23 ENCOUNTER — Other Ambulatory Visit: Payer: Self-pay | Admitting: Obstetrics and Gynecology

## 2014-04-24 NOTE — Pre-Procedure Instructions (Signed)
Written instructions concerning Xarelto rec'd from [facility]. Verified with patient that she was given instructions per Dr Theodis Aguas request.

## 2014-04-27 MED ORDER — DEXTROSE 5 % IV SOLN
2.0000 g | INTRAVENOUS | Status: AC
  Administered 2014-04-28: 2 g via INTRAVENOUS
  Filled 2014-04-27: qty 2

## 2014-04-28 ENCOUNTER — Observation Stay (HOSPITAL_COMMUNITY)
Admission: RE | Admit: 2014-04-28 | Discharge: 2014-04-29 | Disposition: A | Discharge status: Home / Self Care | Payer: Medicare Other | Source: Ambulatory Visit | Attending: Obstetrics and Gynecology | Admitting: Obstetrics and Gynecology

## 2014-04-28 ENCOUNTER — Ambulatory Visit (HOSPITAL_COMMUNITY): Payer: Medicare Other | Admitting: Anesthesiology

## 2014-04-28 ENCOUNTER — Encounter (HOSPITAL_COMMUNITY)
Admission: RE | Disposition: A | Discharge status: Home / Self Care | Payer: Self-pay | Source: Ambulatory Visit | Attending: Obstetrics and Gynecology

## 2014-04-28 ENCOUNTER — Encounter (HOSPITAL_COMMUNITY): Payer: Medicare Other | Admitting: Anesthesiology

## 2014-04-28 DIAGNOSIS — Z86718 Personal history of other venous thrombosis and embolism: Secondary | ICD-10-CM | POA: Diagnosis not present

## 2014-04-28 DIAGNOSIS — Y838 Other surgical procedures as the cause of abnormal reaction of the patient, or of later complication, without mention of misadventure at the time of the procedure: Secondary | ICD-10-CM | POA: Diagnosis not present

## 2014-04-28 DIAGNOSIS — F3289 Other specified depressive episodes: Secondary | ICD-10-CM | POA: Diagnosis not present

## 2014-04-28 DIAGNOSIS — T83711A Erosion of implanted vaginal mesh and other prosthetic materials to surrounding organ or tissue, initial encounter: Secondary | ICD-10-CM | POA: Diagnosis not present

## 2014-04-28 DIAGNOSIS — E119 Type 2 diabetes mellitus without complications: Secondary | ICD-10-CM | POA: Diagnosis not present

## 2014-04-28 DIAGNOSIS — R569 Unspecified convulsions: Secondary | ICD-10-CM | POA: Insufficient documentation

## 2014-04-28 DIAGNOSIS — Z8673 Personal history of transient ischemic attack (TIA), and cerebral infarction without residual deficits: Secondary | ICD-10-CM | POA: Insufficient documentation

## 2014-04-28 DIAGNOSIS — N949 Unspecified condition associated with female genital organs and menstrual cycle: Secondary | ICD-10-CM | POA: Insufficient documentation

## 2014-04-28 DIAGNOSIS — I10 Essential (primary) hypertension: Secondary | ICD-10-CM | POA: Insufficient documentation

## 2014-04-28 DIAGNOSIS — D649 Anemia, unspecified: Secondary | ICD-10-CM | POA: Insufficient documentation

## 2014-04-28 DIAGNOSIS — G4730 Sleep apnea, unspecified: Secondary | ICD-10-CM | POA: Insufficient documentation

## 2014-04-28 DIAGNOSIS — F329 Major depressive disorder, single episode, unspecified: Secondary | ICD-10-CM | POA: Insufficient documentation

## 2014-04-28 HISTORY — PX: TRANSVAGINAL TAPE (TVT) REMOVAL: SHX6154

## 2014-04-28 LAB — COMPREHENSIVE METABOLIC PANEL
ALK PHOS: 66 U/L (ref 39–117)
ALT: 25 U/L (ref 0–35)
AST: 26 U/L (ref 0–37)
Albumin: 3.2 g/dL — ABNORMAL LOW (ref 3.5–5.2)
BUN: 6 mg/dL (ref 6–23)
CHLORIDE: 99 meq/L (ref 96–112)
CO2: 29 meq/L (ref 19–32)
Calcium: 9 mg/dL (ref 8.4–10.5)
Creatinine, Ser: 0.69 mg/dL (ref 0.50–1.10)
GFR calc Af Amer: >90 mL/min (ref >90)
GLUCOSE: 231 mg/dL — AB (ref 70–99)
POTASSIUM: 4.1 meq/L (ref 3.7–5.3)
SODIUM: 138 meq/L (ref 137–147)
TOTAL PROTEIN: 6.8 g/dL (ref 6.0–8.3)
Total Bilirubin: <0.2 mg/dL — ABNORMAL LOW (ref 0.3–1.2)

## 2014-04-28 LAB — CBC
HEMATOCRIT: 37.9 % (ref 36.0–46.0)
HEMOGLOBIN: 12.6 g/dL (ref 12.0–15.0)
MCH: 25.9 pg — ABNORMAL LOW (ref 26.0–34.0)
MCHC: 33.2 g/dL (ref 30.0–36.0)
MCV: 77.8 fL — ABNORMAL LOW (ref 78.0–100.0)
Platelets: 261 10*3/uL (ref 150–400)
RBC: 4.87 MIL/uL (ref 3.87–5.11)
RDW: 15.3 % (ref 11.5–15.5)
WBC: 12.1 10*3/uL — AB (ref 4.0–10.5)

## 2014-04-28 LAB — GLUCOSE, CAPILLARY
GLUCOSE-CAPILLARY: 233 mg/dL — AB (ref 70–99)
Glucose-Capillary: 191 mg/dL — ABNORMAL HIGH (ref 70–99)
Glucose-Capillary: 171 mg/dL — ABNORMAL HIGH (ref 70–99)
Glucose-Capillary: 196 mg/dL — ABNORMAL HIGH (ref 70–99)

## 2014-04-28 LAB — MAGNESIUM: Magnesium: 1.8 mg/dL (ref 1.5–2.5)

## 2014-04-28 SURGERY — TRANSVAGINAL TAPE (TVT) REMOVAL
Anesthesia: General | Site: Vagina

## 2014-04-28 MED ORDER — FENTANYL CITRATE 0.05 MG/ML IJ SOLN
INTRAMUSCULAR | Status: AC
  Filled 2014-04-28: qty 5

## 2014-04-28 MED ORDER — LIDOCAINE HCL (CARDIAC) 20 MG/ML IV SOLN
INTRAVENOUS | Status: AC
  Filled 2014-04-28: qty 5

## 2014-04-28 MED ORDER — FENTANYL CITRATE 0.05 MG/ML IJ SOLN: 25.0000 ug | INTRAMUSCULAR | Status: DC | PRN

## 2014-04-28 MED ORDER — MIDAZOLAM HCL 2 MG/2ML IJ SOLN
INTRAMUSCULAR | Status: AC
  Filled 2014-04-28: qty 2

## 2014-04-28 MED ORDER — MIDAZOLAM HCL 2 MG/2ML IJ SOLN: 0.5000 mg | Freq: Once | INTRAMUSCULAR | Status: DC | PRN

## 2014-04-28 MED ORDER — PHENYLEPHRINE HCL 10 MG/ML IJ SOLN
INTRAMUSCULAR | Status: AC
  Filled 2014-04-28: qty 1

## 2014-04-28 MED ORDER — PROMETHAZINE HCL 25 MG/ML IJ SOLN: 12.5000 mg | Freq: Once | INTRAMUSCULAR | Status: DC

## 2014-04-28 MED ORDER — SODIUM CHLORIDE 0.9 % IJ SOLN
INTRAMUSCULAR | Status: AC
  Filled 2014-04-28: qty 100

## 2014-04-28 MED ORDER — INSULIN DETEMIR 100 UNIT/ML ~~LOC~~ SOLN
25.0000 [IU] | Freq: Two times a day (BID) | SUBCUTANEOUS | Status: DC
  Administered 2014-04-29: 25 [IU] via SUBCUTANEOUS
  Filled 2014-04-28 (×3): qty 0.25

## 2014-04-28 MED ORDER — ROCURONIUM BROMIDE 100 MG/10ML IV SOLN
INTRAVENOUS | Status: DC | PRN
  Administered 2014-04-28: 30 mg via INTRAVENOUS

## 2014-04-28 MED ORDER — MEPERIDINE HCL 25 MG/ML IJ SOLN: 6.2500 mg | INTRAMUSCULAR | Status: DC | PRN

## 2014-04-28 MED ORDER — PROMETHAZINE HCL 25 MG/ML IJ SOLN
INTRAMUSCULAR | Status: AC
  Administered 2014-04-28: 12.5 mg via INTRAVENOUS
  Filled 2014-04-28: qty 1

## 2014-04-28 MED ORDER — LIDOCAINE IN DEXTROSE 5-7.5 % IV SOLN
INTRAVENOUS | Status: AC
  Filled 2014-04-28: qty 2

## 2014-04-28 MED ORDER — CANAGLIFLOZIN 300 MG PO TABS
300.0000 mg | ORAL_TABLET | Freq: Every day | ORAL | Status: DC
  Administered 2014-04-29: 300 mg via ORAL
  Filled 2014-04-28 (×3): qty 1

## 2014-04-28 MED ORDER — PROPOFOL 10 MG/ML IV EMUL
INTRAVENOUS | Status: AC
  Filled 2014-04-28: qty 40

## 2014-04-28 MED ORDER — INSULIN ASPART 100 UNIT/ML ~~LOC~~ SOLN
0.0000 [IU] | SUBCUTANEOUS | Status: DC
  Administered 2014-04-28: 4 [IU] via SUBCUTANEOUS

## 2014-04-28 MED ORDER — PROPOFOL 10 MG/ML IV EMUL
INTRAVENOUS | Status: DC | PRN
  Administered 2014-04-28: 180 ug/kg/min via INTRAVENOUS

## 2014-04-28 MED ORDER — ESTRADIOL 0.1 MG/GM VA CREA
TOPICAL_CREAM | VAGINAL | Status: AC
  Filled 2014-04-28: qty 42.5

## 2014-04-28 MED ORDER — PROMETHAZINE HCL 25 MG/ML IJ SOLN: 6.2500 mg | INTRAMUSCULAR | Status: DC | PRN

## 2014-04-28 MED ORDER — HYDROMORPHONE HCL PF 1 MG/ML IJ SOLN
INTRAMUSCULAR | Status: AC
  Filled 2014-04-28: qty 1

## 2014-04-28 MED ORDER — ATORVASTATIN CALCIUM 10 MG PO TABS
10.0000 mg | ORAL_TABLET | Freq: Every day | ORAL | Status: DC
  Administered 2014-04-28 – 2014-04-29 (×2): 10 mg via ORAL
  Filled 2014-04-28 (×2): qty 1

## 2014-04-28 MED ORDER — HYDROMORPHONE HCL 2 MG PO TABS: 2.0000 mg | ORAL_TABLET | ORAL | Status: DC | PRN

## 2014-04-28 MED ORDER — CEFAZOLIN SODIUM-DEXTROSE 1-4 GM-% IV SOLR: 2.0000 g | Freq: Once | INTRAVENOUS | Status: DC

## 2014-04-28 MED ORDER — PROPOFOL 10 MG/ML IV EMUL
INTRAVENOUS | Status: AC
  Filled 2014-04-28: qty 20

## 2014-04-28 MED ORDER — LABETALOL HCL 5 MG/ML IV SOLN
INTRAVENOUS | Status: AC
  Filled 2014-04-28: qty 4

## 2014-04-28 MED ORDER — ONDANSETRON HCL 4 MG/2ML IJ SOLN
INTRAMUSCULAR | Status: DC | PRN
  Administered 2014-04-28: 4 mg via INTRAVENOUS

## 2014-04-28 MED ORDER — MIDAZOLAM HCL 2 MG/2ML IJ SOLN
INTRAMUSCULAR | Status: DC | PRN
  Administered 2014-04-28: 2 mg via INTRAVENOUS

## 2014-04-28 MED ORDER — VASOPRESSIN 20 UNIT/ML IJ SOLN
INTRAMUSCULAR | Status: AC
  Filled 2014-04-28: qty 1

## 2014-04-28 MED ORDER — ALBUTEROL SULFATE (2.5 MG/3ML) 0.083% IN NEBU: 2.5000 mg | INHALATION_SOLUTION | Freq: Four times a day (QID) | RESPIRATORY_TRACT | Status: DC | PRN

## 2014-04-28 MED ORDER — KETOROLAC TROMETHAMINE 30 MG/ML IJ SOLN: 15.0000 mg | Freq: Once | INTRAMUSCULAR | Status: DC | PRN

## 2014-04-28 MED ORDER — PROMETHAZINE HCL 25 MG/ML IJ SOLN
12.5000 mg | Freq: Four times a day (QID) | INTRAMUSCULAR | Status: DC | PRN
  Administered 2014-04-28: 12.5 mg via INTRAVENOUS
  Filled 2014-04-28: qty 1

## 2014-04-28 MED ORDER — BUDESONIDE-FORMOTEROL FUMARATE 80-4.5 MCG/ACT IN AERO
2.0000 | INHALATION_SPRAY | Freq: Every day | RESPIRATORY_TRACT | Status: DC
  Administered 2014-04-28 – 2014-04-29 (×2): 2 via RESPIRATORY_TRACT
  Filled 2014-04-28: qty 6.9

## 2014-04-28 MED ORDER — AMLODIPINE BESYLATE 10 MG PO TABS
10.0000 mg | ORAL_TABLET | Freq: Every day | ORAL | Status: DC
  Administered 2014-04-28: 10 mg via ORAL
  Filled 2014-04-28 (×2): qty 1

## 2014-04-28 MED ORDER — METHYLDOPA 250 MG PO TABS
250.0000 mg | ORAL_TABLET | Freq: Every day | ORAL | Status: DC
  Administered 2014-04-28: 250 mg via ORAL
  Filled 2014-04-28 (×3): qty 1

## 2014-04-28 MED ORDER — VASOPRESSIN 20 UNIT/ML IJ SOLN
INTRAVENOUS | Status: DC | PRN
  Administered 2014-04-28: 16:00:00 via INTRAMUSCULAR

## 2014-04-28 MED ORDER — INSULIN ASPART 100 UNIT/ML ~~LOC~~ SOLN
SUBCUTANEOUS | Status: AC
  Administered 2014-04-28: 2 [IU] via SUBCUTANEOUS
  Filled 2014-04-28: qty 1

## 2014-04-28 MED ORDER — ONDANSETRON HCL 4 MG/2ML IJ SOLN
INTRAMUSCULAR | Status: AC
  Filled 2014-04-28: qty 2

## 2014-04-28 MED ORDER — LABETALOL HCL 5 MG/ML IV SOLN
INTRAVENOUS | Status: DC | PRN
  Administered 2014-04-28: 3 mg via INTRAVENOUS
  Administered 2014-04-28 (×4): 2 mg via INTRAVENOUS

## 2014-04-28 MED ORDER — PROPOFOL 10 MG/ML IV BOLUS
INTRAVENOUS | Status: DC | PRN
  Administered 2014-04-28 (×2): 50 mg via INTRAVENOUS
  Administered 2014-04-28: 200 mg via INTRAVENOUS

## 2014-04-28 MED ORDER — LACTATED RINGERS IV SOLN
INTRAVENOUS | Status: DC
  Administered 2014-04-29 (×2): via INTRAVENOUS

## 2014-04-28 MED ORDER — GLYCOPYRROLATE 0.2 MG/ML IJ SOLN
INTRAMUSCULAR | Status: DC | PRN
  Administered 2014-04-28: 0.4 mg via INTRAVENOUS

## 2014-04-28 MED ORDER — FENTANYL CITRATE 0.05 MG/ML IJ SOLN
INTRAMUSCULAR | Status: DC | PRN
  Administered 2014-04-28: 100 ug via INTRAVENOUS

## 2014-04-28 MED ORDER — NEOSTIGMINE METHYLSULFATE 10 MG/10ML IV SOLN
INTRAVENOUS | Status: DC | PRN
  Administered 2014-04-28: 2 mg via INTRAVENOUS

## 2014-04-28 MED ORDER — LISINOPRIL 20 MG PO TABS
30.0000 mg | ORAL_TABLET | Freq: Every day | ORAL | Status: DC
  Administered 2014-04-28: 30 mg via ORAL
  Filled 2014-04-28 (×2): qty 1

## 2014-04-28 MED ORDER — KETOROLAC TROMETHAMINE 30 MG/ML IJ SOLN: 30.0000 mg | Freq: Four times a day (QID) | INTRAMUSCULAR | Status: DC

## 2014-04-28 MED ORDER — TOPIRAMATE 25 MG PO TABS
25.0000 mg | ORAL_TABLET | Freq: Every day | ORAL | Status: DC
  Administered 2014-04-28: 25 mg via ORAL
  Filled 2014-04-28 (×2): qty 1

## 2014-04-28 MED ORDER — LORAZEPAM 2 MG/ML IJ SOLN
2.0000 mg | Freq: Once | INTRAMUSCULAR | Status: AC
  Administered 2014-04-28: 2 mg via INTRAVENOUS
  Filled 2014-04-28: qty 1

## 2014-04-28 MED ORDER — METOPROLOL TARTRATE 1 MG/ML IV SOLN
1.0000 mg | INTRAVENOUS | Status: DC | PRN
  Filled 2014-04-28: qty 5

## 2014-04-28 MED ORDER — HYDROMORPHONE HCL PF 1 MG/ML IJ SOLN
INTRAMUSCULAR | Status: DC | PRN
  Administered 2014-04-28: 1 mg via INTRAVENOUS

## 2014-04-28 MED ORDER — LACTATED RINGERS IV SOLN
INTRAVENOUS | Status: DC
  Administered 2014-04-28 (×3): via INTRAVENOUS

## 2014-04-28 SURGICAL SUPPLY — 28 items
BLADE 11 SAFETY STRL DISP (BLADE) IMPLANT
BLADE 15 SAFETY STRL DISP (BLADE) ×2 IMPLANT
CANISTER SUCT 3000ML (MISCELLANEOUS) ×2 IMPLANT
CATH FOLEY 2WAY SLVR  5CC 18FR (CATHETERS) ×1
CATH FOLEY 2WAY SLVR 5CC 18FR (CATHETERS) ×1 IMPLANT
CLOTH BEACON ORANGE TIMEOUT ST (SAFETY) ×2 IMPLANT
DECANTER SPIKE VIAL GLASS SM (MISCELLANEOUS) ×2 IMPLANT
DRAPE HYSTEROSCOPY (DRAPE) ×2 IMPLANT
GAUZE PACKING 2X5 YD STRL (GAUZE/BANDAGES/DRESSINGS) IMPLANT
GAUZE SPONGE 4X4 16PLY XRAY LF (GAUZE/BANDAGES/DRESSINGS) IMPLANT
GLOVE BIO SURGEON STRL SZ7.5 (GLOVE) ×2 IMPLANT
GLOVE BIOGEL PI IND STRL 7.5 (GLOVE) ×1 IMPLANT
GLOVE BIOGEL PI INDICATOR 7.5 (GLOVE) ×1
GOWN STRL REUS W/TWL LRG LVL3 (GOWN DISPOSABLE) ×4 IMPLANT
NEEDLE HYPO 22GX1.5 SAFETY (NEEDLE) ×2 IMPLANT
NEEDLE SPNL 22GX3.5 QUINCKE BK (NEEDLE) IMPLANT
NS IRRIG 1000ML POUR BTL (IV SOLUTION) ×2 IMPLANT
PACK VAGINAL WOMENS (CUSTOM PROCEDURE TRAY) ×2 IMPLANT
SET CYSTO W/LG BORE CLAMP LF (SET/KITS/TRAYS/PACK) ×2 IMPLANT
SUT MNCRL AB 3-0 PS2 27 (SUTURE) IMPLANT
SUT MNCRL AB 4-0 PS2 18 (SUTURE) IMPLANT
SUT VIC AB 0 CT1 27 (SUTURE) ×1
SUT VIC AB 0 CT1 27XBRD ANBCTR (SUTURE) ×1 IMPLANT
SUT VIC AB 2-0 SH 27 (SUTURE)
SUT VIC AB 2-0 SH 27XBRD (SUTURE) IMPLANT
TOWEL OR 17X24 6PK STRL BLUE (TOWEL DISPOSABLE) ×4 IMPLANT
TRAY FOLEY CATH 14FR (SET/KITS/TRAYS/PACK) IMPLANT
WATER STERILE IRR 1000ML POUR (IV SOLUTION) IMPLANT

## 2014-04-28 NOTE — Anesthesia Postprocedure Evaluation (Signed)
  Anesthesia Post-op Note  Patient: Makayla Frazier  Procedure(s) Performed: Procedure(s): Removal of suburethral mesh (N/A)  Patient Location: PACU  Anesthesia Type:General  Level of Consciousness: awake, alert  and oriented  Airway and Oxygen Therapy: Patient Spontanous Breathing and Patient connected to face mask  Post-op Pain: none  Post-op Assessment: Post-op Vital signs reviewed, Patient's Cardiovascular Status Stable, Respiratory Function Stable, Patent Airway, No signs of Nausea or vomiting and Pain level controlled  Post-op Vital Signs: Reviewed and stable  Last Vitals:  Filed Vitals:   04/28/14 2030  BP: 124/87  Pulse: 98  Temp:   Resp: 14    Complications: No apparent anesthesia complications. There was some question about whether she was having seizure activity post op. She had some tremors of her arm and "erratic" breathing but has had none of these signs in the last 1 hour and 45 mins. She is stable for discharge from PACU

## 2014-04-28 NOTE — H&P (View-Only) (Signed)
Makayla Frazier is a 43 y.o. G 0 female who is S/P hyperectomy (2014) and Placement of Tension Free Vaginal Tape (February 13, 2014) presenting for removal of vaginal mesh.  Since her bladder sling procedure in March,  the patient reports vaginal pain that is off & on along with occasional vaginal spotting.  She admits to occasional urinary urgency but no incontinence,  vaginitis symptoms,  changes in bowel function and has not had intercourse. She was treated earlier in this process with an antibiotic for bacterial vaginosis but continues to have vaginal discomfort.  Due to the persistence of her symptoms, the patient wants to have the vaginal mesh removed.   Past Medical History   OB History: G0  GYN History: menarche: 43 YO LMP: 12/18/2012; Contracepton: Hysterectomy The patient reports a past history of: herpes and HPV. Remote History of abnormal PAP smear as a teen that was treated with cryotherapy; Last PAP smear: 06/10/2012   Medical History: Thyroid Disease, Hypertension, Diabetes Mellitus, Vitamin D. Deficiency, Deep Vein Thrombosis/Pulmonary Embolism, Anemia, Transient Ischemic Attack, Depression (suicide attempt 2009), Asthma, Sleep Apnea, Foot Neuropathy and Stress Urinary Incontinence.   Surgical History: 1999 Cholecystectomy; 2011 Breast Reduction; 2007 & 2013 D & C; 2014 Laparoscopically Assisted Vaginal Hysterectomy/Bilateral Salpingectomy  2015 Placement of Tension Free Vaginal Tape/Cystoscopy Denies problems with anesthesia or history of blood transfusions   Family History: Ulcerative Colitis, Skin/Bone Cancer, Hypertension, Diabetes Mellitus, Pulmonary Embolism, Breast Cancer   Social History: Single and unemployed; Denies tobacco use but does consume alcohol occasionally   Medications:   Xarelto 20 mg daily  Amlodipine 5 mg daily  Asmanex Twisthaler 220 mcg  Aspirin daily  Atorvastatin 10 mg daily  Invokana 300 mg daily  Novolog Flex- Pen  Levemir 100 units/mL SQ  Lisinopril  20 mg/HCTZ 12.5 mg daily  Loratidine 10 mg  ProAir HFA 90 mcg Inhaler   Allergies   Allergen  Reactions   .  Codeine  Nausea And Vomiting       .  Darvocet [Propoxyphene N-Acetaminophen]  Nausea And Vomiting     vomiting   .  Hydrocodone-Acetaminophen  Nausea And Vomiting     Vomiting   .  Oxycodone-Acetaminophen  Nausea And Vomiting     vomiting   .  Percocet [Oxycodone-Acetaminophen]  Nausea And Vomiting     vomiting   .  Latex  Rash     rash     ROS: Admits to glasses and occasional constipation (made better with stool softeners) ; Denies headache, vision changes, nasal congestion, dysphagia, tinnitus, dizziness, hoarseness, cough, chest pain, shortness of breath, nausea, vomiting, diarrhea, urinary frequency, urgency dysuria, hematuria, vaginitis symptoms, pelvic pain, swelling of joints,easy bruising, myalgias, arthralgias, skin rashes, unexplained weight loss and except as is mentioned in the history of present illness, patient's review of systems is otherwise negative.   Denies sensitivity to peanuts, shellfish, soy, or adhesives.   Physical Exam   Bp: 110/78   P: 86   R: 12  Temperature: 98.1 degrees F orally    Weight: 172 lbs. Height: 5 '    BMI: 33,6  Neck: supple without masses or thyromegaly  Lungs: clear to auscultation  Heart: regular rate and rhythm  Abdomen: soft, mild supra pubic tenderness without guarding and no organomegaly  Pelvic:EGBUS- wnl; vagina-with tenderness anteriorly along mid-line with visible exposed suture but no evidence of infection or visible would disruption; uterus/cervix-surgically absent, adnexae-no tenderness or masses  Extremities: no clubbing, cyanosis or edema  Assesment: Vaginal Pain                      S/P  Placement of Tension Free Vaginal Tape   Disposition: A discussion was held with patient regarding the indication for her procedure(s) along with the risks, which include but are not limited to: reaction to anesthesia, damage  to adjacent organs, infection, urinary retention and excessive bleeding. The patient verbalized understanding of these risks and has consented to proceed with Removal  of Tension-Free Vaginal Tape and at [facility] on Apr 28, 2014 at 3:15 p.m.     Jisselle Poth J. Florene Glen, PA-C for Dr. Harvie Bridge. Mancel Bale

## 2014-04-28 NOTE — Transfer of Care (Signed)
Immediate Anesthesia Transfer of Care Note  Patient: Makayla Frazier  Procedure(s) Performed: Procedure(s): Removal of suburethral mesh (N/A)  Patient Location: PACU  Anesthesia Type:General  Level of Consciousness: awake, sedated and responds to stimulation  Airway & Oxygen Therapy: Patient Spontanous Breathing and Patient connected to face mask oxygen  Post-op Assessment: Report given to PACU RN and Post -op Vital signs reviewed and stable  Post vital signs: Reviewed and stable  Complications: No apparent anesthesia complications

## 2014-04-28 NOTE — Anesthesia Preprocedure Evaluation (Signed)
Anesthesia Evaluation  Patient identified by MRN, date of birth, ID band Patient awake    Reviewed: Allergy & Precautions, H&P , Patient's Chart, lab work & pertinent test results, reviewed documented beta blocker date and time   History of Anesthesia Complications (+) history of anesthetic complications  Airway Mallampati: II TM Distance: >3 FB Neck ROM: full    Dental   Pulmonary asthma , sleep apnea ,  breath sounds clear to auscultation        Cardiovascular Exercise Tolerance: Good hypertension, + Peripheral Vascular Disease Rhythm:regular Rate:Normal     Neuro/Psych  Headaches, Seizures -, Well Controlled,  PSYCHIATRIC DISORDERS Depression TIA Neuromuscular disease CVA    GI/Hepatic   Endo/Other  diabetes  Renal/GU      Musculoskeletal   Abdominal   Peds  Hematology  (+) anemia ,   Anesthesia Other Findings   Reproductive/Obstetrics                           Anesthesia Physical Anesthesia Plan  ASA: III  Anesthesia Plan: General ETT   Post-op Pain Management:    Induction:   Airway Management Planned:   Additional Equipment:   Intra-op Plan:   Post-operative Plan:   Informed Consent: I have reviewed the patients History and Physical, chart, labs and discussed the procedure including the risks, benefits and alternatives for the proposed anesthesia with the patient or authorized representative who has indicated his/her understanding and acceptance.   Dental Advisory Given  Plan Discussed with: CRNA and Surgeon  Anesthesia Plan Comments:         Anesthesia Quick Evaluation

## 2014-04-28 NOTE — Interval H&P Note (Signed)
History and Physical Interval Note:  04/28/2014 3:07 PM  Makayla Frazier  has presented today for surgery, with the diagnosis of Questionable Mesh eruption  The various methods of treatment have been discussed with the patient and family. After consideration of risks, benefits and other options for treatment, the patient has consented to  Procedure(s): TRANSVAGINAL TAPE (TVT) REMOVAL (N/A) as a surgical intervention .  The patient's history has been reviewed, patient examined, no change in status, stable for surgery.  I have reviewed the patient's chart and labs.  Questions were answered to the patient's satisfaction.     Delice Lesch

## 2014-04-28 NOTE — Op Note (Addendum)
Preop Diagnosis: 1.Mesh eruption 2.h/o seizure disorder  Postop Diagnosis: 1.Mesh erosion 2.h/o seizure disorder  Procedure: REMOVAL OF SUBURETHRAL MESH   Anesthesia: General   Anesthesiologist: Rudean Curt, MD   Attending: Delice Lesch, MD   Assistant: N/a  Findings: About 1/2cm of mesh was visible  Pathology: N/a  Fluids: See flowsheet  UOP: See flowsheet  EBL: See flowsheet  Complications: None  Procedure: The patient was taken to the operating room after risks, benefits and alternatives were discussed with the patient and consent signed and witnessed.  A time out was performed per protocol.  The patient was placed under anesthesia (avoiding meds that lower seizure threshold and 2mg  of ativan was given as well) and pt was prepped and draped in the usual sterile fashion.  The patient was placed in the dorsal lithotomy position and 10 cc of dilute pitressin (20 units of pitressin in 100 cc NS) injected.  The mesh was dissected away from the tissue and an portion of the mesh measuring about 1-2 cm was removed.  The entire suburethral portion was removed and small amounts extending to either side were removed bilaterally.  The vaginal wall was repaired with 2-0 vicryl via a running interlocking stitch.  Sponge, lap and needle count was correct.  The patient tolerated the procedure well and was returned to the recovery room in good condition.    Called by Dr. Rudean Curt secondary to pt's right arm twitching.  Upon my arrival pt was stable on CPAP machine with good vital signs.  I will transfer to AICU for observation overnight.

## 2014-04-28 NOTE — Progress Notes (Signed)
1810--At bedside twitching notied in right arm, unresponsive, respiratory rate irregular,M Collins CRNA and Dr. Dawayne Cirri notified and to PACU at bedside. At bedside for 15 minutes. Patient continues to be unresponse. BP 172/125, medicated with Labetalol by the CRNA.  CPAP continues. Dr. Dawayne Cirri notified Dr. Mancel Bale.

## 2014-04-29 ENCOUNTER — Encounter (HOSPITAL_COMMUNITY): Payer: Self-pay | Admitting: Obstetrics and Gynecology

## 2014-04-29 DIAGNOSIS — T83711A Erosion of implanted vaginal mesh and other prosthetic materials to surrounding organ or tissue, initial encounter: Secondary | ICD-10-CM | POA: Diagnosis not present

## 2014-04-29 LAB — GLUCOSE, CAPILLARY
GLUCOSE-CAPILLARY: 233 mg/dL — AB (ref 70–99)
Glucose-Capillary: 179 mg/dL — ABNORMAL HIGH (ref 70–99)
Glucose-Capillary: 192 mg/dL — ABNORMAL HIGH (ref 70–99)

## 2014-04-29 MED ORDER — INSULIN ASPART 100 UNIT/ML ~~LOC~~ SOLN
0.0000 [IU] | Freq: Every day | SUBCUTANEOUS | Status: DC
  Administered 2014-04-28: 2 [IU] via SUBCUTANEOUS
  Administered 2014-04-29: 8 [IU] via SUBCUTANEOUS
  Administered 2014-04-29 (×2): 4 [IU] via SUBCUTANEOUS

## 2014-04-29 MED ORDER — ACETAMINOPHEN 325 MG PO TABS
650.0000 mg | ORAL_TABLET | Freq: Four times a day (QID) | ORAL | Status: DC | PRN
  Administered 2014-04-29: 650 mg via ORAL
  Filled 2014-04-29: qty 2

## 2014-04-29 MED ORDER — HYDROMORPHONE HCL 2 MG PO TABS: 2.0000 mg | ORAL_TABLET | ORAL | Status: DC | PRN

## 2014-04-29 MED ORDER — IBUPROFEN 600 MG PO TABS
600.0000 mg | ORAL_TABLET | Freq: Four times a day (QID) | ORAL | Status: DC | PRN
Start: 2014-04-29 — End: 2014-10-10

## 2014-04-29 MED ORDER — KETOROLAC TROMETHAMINE 30 MG/ML IJ SOLN
30.0000 mg | Freq: Four times a day (QID) | INTRAMUSCULAR | Status: DC
  Administered 2014-04-29 (×2): 30 mg via INTRAVENOUS
  Filled 2014-04-29 (×2): qty 1

## 2014-04-29 NOTE — Progress Notes (Signed)
Makayla Frazier is a17 y.o.  [id number]  Post Op Date # 1:  Removal of Vaginal Mesh/Post Operative Seizure  Subjective: Patient is sleeping but arousable, States she's eaten and had beverages. Rates her pain at 5/10 on a 10 point pain scale.  Received IV Toradol 30 mg approximately 15 minutes ago.  Hasn't voided since Foley was removed. Patient has not ambulated yet.  Objective: Vital signs in last 24 hours: Temp:  [97.7 F (36.5 C)-99.6 F (37.6 C)] 99.6 F (37.6 C) (05/20 0835) Pulse Rate:  [88-105] 94 (05/20 1100) Resp:  [7-29] 16 (05/20 1100) BP: (96-172)/(56-125) 114/73 mmHg (05/20 1100) SpO2:  [91 %-100 %] 96 % (05/20 1100) Weight:  [173 lb (78.472 kg)] 173 lb (78.472 kg) (05/19 2200)  Intake/Output from previous day: 05/19 0701 - 05/20 0700 In: 3030 [P.O.:180; I.V.:2850] Out: 6606 [Urine:1400] Intake/Output this shift: Total I/O In: 600 [P.O.:100; I.V.:500] Out: -   Recent Labs Lab 04/28/14 2006  WBC 12.1*  HGB 12.6  HCT 37.9  PLT 261     Recent Labs Lab 04/28/14 2006  NA 138  K 4.1  CL 99  CO2 29  BUN 6  CREATININE 0.69  CALCIUM 9.0  PROT 6.8  BILITOT <0.2*  ALKPHOS 66  ALT 25  AST 26  GLUCOSE 231*    EXAM: General: cooperative, no distress and groggy. Resp: clear to auscultation bilaterally Cardio: regular rate and rhythm, S1, S2 normal, no murmur, click, rub or gallop GI: Bowel sounds present, abdomen-soft. Extremities: SCD hose in place and functioning with no calf tenderness to palpation. Vaginal Bleeding: Faint dry stain on perineal pad.   Assessment: s/p Procedure(s): Removal of suburethral mesh: stable and progressing well  Plan: Encourage ambulation Awaiting patient to void  LOS: 1 day    Earnstine Regal, PA-C 04/29/2014 11:39 AM

## 2014-04-29 NOTE — Progress Notes (Signed)
Brief period (less than 1 min) of tremors in rt arm and hand and increased, not erratic, breathing.  Responded to verbal command, chest stimulus to open eyes.  Brief period of confusion but then appropriate answers to questions and follows commands.  Pupils assessed - equal and reactive.  No loss of bowel/bladder control noted.

## 2014-04-29 NOTE — Progress Notes (Signed)
Inpatient Diabetes Program Recommendations  AACE/ADA: New Consensus Statement on Inpatient Glycemic Control (2013)  Target Ranges:  Prepandial:   less than 140 mg/dL      Peak postprandial:   less than 180 mg/dL (1-2 hours)      Critically ill patients:  140 - 180 mg/dL   Results for KHRISTINE, VERNO (MRN [id number]) as of 04/29/2014 07:23  Ref. Range 04/28/2014 13:38 04/28/2014 14:35 04/28/2014 18:20 04/28/2014 21:42  Glucose-Capillary Latest Range: 70-99 mg/dL 233 (H) 191 (H) 171 (H) 196 (H)   Diabetes history: DM2 Outpatient Diabetes medications: Levemir 25 units BID, Novolog 9 units TID with meals, Invokana 300 mg daily Current orders for Inpatient glycemic control:  Levemir 25 units BID, Novolog 0-24 units Q4H  Inpatient Diabetes Program Recommendations HgbA1C: Please consider ordering an A1C to evaluate glycemic control over the past 2-3 months. Diet: Please change diet from Regular to Carb Modified Diabetic diet.  Thanks, Barnie Alderman, RN, MSN, CCRN Diabetes Coordinator Inpatient Diabetes Program [phone] (Team Pager) [phone] ([facility] office) [phone] [facility] office)

## 2014-04-29 NOTE — Progress Notes (Signed)
Pt d/c'd home with prescriptions & instructions, accompanied by mother

## 2014-04-29 NOTE — Anesthesia Postprocedure Evaluation (Signed)
  Anesthesia Post-op Note  Patient: Makayla Frazier  Procedure(s) Performed: Procedure(s): Removal of suburethral mesh (N/A)  Patient Location: A-ICU  Anesthesia Type:General  Level of Consciousness: awake, oriented and patient cooperative  Airway and Oxygen Therapy: Patient Spontanous Breathing  Post-op Pain: none  Post-op Assessment: Post-op Vital signs reviewed, Patient's Cardiovascular Status Stable, Respiratory Function Stable, Patent Airway, No signs of Nausea or vomiting, Adequate PO intake and Pain level controlled  Post-op Vital Signs: Reviewed and stable  Last Vitals:  Filed Vitals:   04/29/14 0835  BP:   Pulse: 90  Temp: 37.6 C  Resp:     Complications: No apparent anesthesia complications

## 2014-04-30 ENCOUNTER — Emergency Department (HOSPITAL_COMMUNITY)
Admission: EM | Admit: 2014-04-30 | Discharge: 2014-04-30 | Disposition: A | Discharge status: Home / Self Care | Payer: Medicare Other | Attending: Emergency Medicine | Admitting: Emergency Medicine

## 2014-04-30 ENCOUNTER — Encounter (HOSPITAL_COMMUNITY): Payer: Self-pay | Admitting: Emergency Medicine

## 2014-04-30 ENCOUNTER — Emergency Department (HOSPITAL_COMMUNITY): Payer: Medicare Other

## 2014-04-30 DIAGNOSIS — Z91040 Latex allergy status: Secondary | ICD-10-CM | POA: Insufficient documentation

## 2014-04-30 DIAGNOSIS — E785 Hyperlipidemia, unspecified: Secondary | ICD-10-CM | POA: Insufficient documentation

## 2014-04-30 DIAGNOSIS — E669 Obesity, unspecified: Secondary | ICD-10-CM | POA: Insufficient documentation

## 2014-04-30 DIAGNOSIS — J45909 Unspecified asthma, uncomplicated: Secondary | ICD-10-CM | POA: Insufficient documentation

## 2014-04-30 DIAGNOSIS — R071 Chest pain on breathing: Secondary | ICD-10-CM | POA: Insufficient documentation

## 2014-04-30 DIAGNOSIS — G40909 Epilepsy, unspecified, not intractable, without status epilepticus: Secondary | ICD-10-CM | POA: Insufficient documentation

## 2014-04-30 DIAGNOSIS — E119 Type 2 diabetes mellitus without complications: Secondary | ICD-10-CM | POA: Insufficient documentation

## 2014-04-30 DIAGNOSIS — Z7901 Long term (current) use of anticoagulants: Secondary | ICD-10-CM | POA: Insufficient documentation

## 2014-04-30 DIAGNOSIS — Z86718 Personal history of other venous thrombosis and embolism: Secondary | ICD-10-CM | POA: Insufficient documentation

## 2014-04-30 DIAGNOSIS — Z79899 Other long term (current) drug therapy: Secondary | ICD-10-CM | POA: Insufficient documentation

## 2014-04-30 DIAGNOSIS — Z8739 Personal history of other diseases of the musculoskeletal system and connective tissue: Secondary | ICD-10-CM | POA: Insufficient documentation

## 2014-04-30 DIAGNOSIS — I10 Essential (primary) hypertension: Secondary | ICD-10-CM | POA: Insufficient documentation

## 2014-04-30 DIAGNOSIS — Z8639 Personal history of other endocrine, nutritional and metabolic disease: Secondary | ICD-10-CM | POA: Insufficient documentation

## 2014-04-30 DIAGNOSIS — Z794 Long term (current) use of insulin: Secondary | ICD-10-CM | POA: Insufficient documentation

## 2014-04-30 DIAGNOSIS — R0789 Other chest pain: Secondary | ICD-10-CM

## 2014-04-30 DIAGNOSIS — Z9089 Acquired absence of other organs: Secondary | ICD-10-CM | POA: Insufficient documentation

## 2014-04-30 DIAGNOSIS — F3130 Bipolar disorder, current episode depressed, mild or moderate severity, unspecified: Secondary | ICD-10-CM | POA: Insufficient documentation

## 2014-04-30 DIAGNOSIS — Z86711 Personal history of pulmonary embolism: Secondary | ICD-10-CM | POA: Insufficient documentation

## 2014-04-30 DIAGNOSIS — Z8673 Personal history of transient ischemic attack (TIA), and cerebral infarction without residual deficits: Secondary | ICD-10-CM | POA: Insufficient documentation

## 2014-04-30 DIAGNOSIS — IMO0002 Reserved for concepts with insufficient information to code with codable children: Secondary | ICD-10-CM | POA: Insufficient documentation

## 2014-04-30 DIAGNOSIS — Z8619 Personal history of other infectious and parasitic diseases: Secondary | ICD-10-CM | POA: Insufficient documentation

## 2014-04-30 DIAGNOSIS — Z862 Personal history of diseases of the blood and blood-forming organs and certain disorders involving the immune mechanism: Secondary | ICD-10-CM | POA: Insufficient documentation

## 2014-04-30 LAB — CBC
HEMATOCRIT: 38.4 % (ref 36.0–46.0)
Hemoglobin: 12.4 g/dL (ref 12.0–15.0)
MCH: 24.8 pg — ABNORMAL LOW (ref 26.0–34.0)
MCHC: 32.3 g/dL (ref 30.0–36.0)
MCV: 77 fL — ABNORMAL LOW (ref 78.0–100.0)
Platelets: 277 10*3/uL (ref 150–400)
RBC: 4.99 MIL/uL (ref 3.87–5.11)
RDW: 15.3 % (ref 11.5–15.5)
WBC: 7.9 10*3/uL (ref 4.0–10.5)

## 2014-04-30 LAB — BASIC METABOLIC PANEL
BUN: 8 mg/dL (ref 6–23)
CALCIUM: 9.4 mg/dL (ref 8.4–10.5)
CO2: 23 mEq/L (ref 19–32)
Chloride: 99 mEq/L (ref 96–112)
Creatinine, Ser: 0.62 mg/dL (ref 0.50–1.10)
GFR calc non Af Amer: >90 mL/min (ref >90)
Glucose, Bld: 274 mg/dL — ABNORMAL HIGH (ref 70–99)
POTASSIUM: 4.2 meq/L (ref 3.7–5.3)
SODIUM: 138 meq/L (ref 137–147)

## 2014-04-30 LAB — I-STAT TROPONIN, ED: Troponin i, poc: 0 ng/mL (ref 0.00–0.08)

## 2014-04-30 MED ORDER — IOHEXOL 350 MG/ML SOLN
100.0000 mL | Freq: Once | INTRAVENOUS | Status: AC | PRN
  Administered 2014-04-30: 100 mL via INTRAVENOUS

## 2014-04-30 NOTE — Discharge Instructions (Signed)

## 2014-04-30 NOTE — ED Provider Notes (Signed)
Medical screening examination/treatment/procedure(s) were conducted as a shared visit with non-physician practitioner(s) and myself.  I personally evaluated the patient during the encounter.   EKG Interpretation None       Date: 04/30/2014  Rate: 94   Rhythm: normal sinus rhythm  QRS Axis: normal  Intervals: normal  ST/T Wave abnormalities: normal  Conduction Disutrbances:none  Narrative Interpretation:   Old EKG Reviewed: none available Nonspecific T wave changes.   Fredia Sorrow, MD 04/30/14 Bos[facility] Helper

## 2014-04-30 NOTE — ED Notes (Signed)
Pt arrived via ems related to sharp reproducible cp sudden on set. Pt has hx of PE states pain feels similar. Pt had sx Tuesday to have bladder mesh removed. Stopped xaralto Sunday restarted last pm. Pt resp easy non labored at this time.

## 2014-04-30 NOTE — ED Provider Notes (Signed)
CSN: [id number]     Arrival date & time 04/30/14  1804 History   First MD Initiated Contact with Patient 04/30/14 1811     Chief Complaint  Patient presents with  . Chest Pain     (Consider location/radiation/quality/duration/timing/severity/associated sxs/prior Treatment) Patient is a 43 y.o. female presenting with chest pain. The history is provided by the patient. No language interpreter was used.  Chest Pain Pain location:  R chest Pain quality: sharp   Radiates to: it radiates to right lateral chest wall. Pain radiates to the back: no   Pain severity:  Moderate Duration:  1 day Associated symptoms: shortness of breath   Associated symptoms: no abdominal pain, no cough, no fever and not vomiting   Associated symptoms comment:  Sharp chest pain, worse with breathing, similar to previous episode of PE. She underwent surgery on 04/28/14 and came off Xarelto pre-surgery. She restarted her medication last night. Today she started having DOE and pleuritic chest pain. No fever or cough. No nausea or vomiting.    Past Medical History  Diagnosis Date  . PE (pulmonary embolism)     2009 - on oral contraception; multiple  . Syncopal episodes     unknown etiology  . Dysfunctional uterine bleeding     Seen by Dr. Leo Grosser, GYN; pending hysterectomy as of 07/2012  . Asthma   . Obesity   . Mastodynia   . Hypertension   . Post - coital bleeding 2012  . Labial lesion 2013  . BV (bacterial vaginosis) 2012  . Oligomenorrhea 2011  . DVT (deep venous thrombosis) 2011    pt had IUD; also 05/2012  . H/O hypercoagulable state     2/2 contraception  . HLD (hyperlipidemia)   . DM (diabetes mellitus)     diagnosed 2009  . Anemia   . Herpes simplex without mention of complication 06/4258    HSV-2  . Thyroid disease     hypothyroidism (pt denies)  . Vitamin D deficiency     unknown to pt.  . Major depression     Hx suicide attempt in 2009, [facility] admissions  . Venous insufficiency   . Sleep  apnea   . Neuropathy   . Stroke     TIA 2013  . Headache(784.0)     migraines  . Neuromuscular disorder     neuropathy  . Arthritis     Left leg  . Status post placement of implantable loop recorder   . Complication of anesthesia     Seizures in PACU after surgery 3/15  . Seizures 2015    last episode May 2015   Past Surgical History  Procedure Laterality Date  . Breast reduction surgery      Dr. Eugene Garnet [facility] 2011  . Cholecystectomy    . Cardiac electrophysiology study & dft      Implantable Loop recorder; no arrhythmias associated with synocpal spells; loop explanted 07-2013  . Endometrial biopsy  2011  . Dilatation & currettage/hysteroscopy with resectocope  2007 & 2013  . Laparoscopic assisted vaginal hysterectomy  12/18/2012    Procedure: LAPAROSCOPIC ASSISTED VAGINAL HYSTERECTOMY;  Surgeon: Eldred Manges, MD;  Location: [facility];  Service: Gynecology;  Laterality: N/A;  . Iud removal  12/18/2012    Procedure: INTRAUTERINE DEVICE (IUD) REMOVAL;  Surgeon: Eldred Manges, MD;  Location: [facility];  Service: Gynecology;  Laterality: N/A;  Removed during prep by E. Florene Glen PA  . Bilateral salpingectomy  12/18/2012    Procedure: BILATERAL  SALPINGECTOMY;  Surgeon: Eldred Manges, MD;  Location: [facility];  Service: Gynecology;  Laterality: Bilateral;  . Abdominal hysterectomy    . Bilateral oophorectomy    . Bladder suspension N/A 02/13/2014    Procedure: TRANSVAGINAL TAPE (TVT) PROCEDURE;  Surgeon: Delice Lesch, MD;  Location: [facility];  Service: Gynecology;  Laterality: N/A;  . Wisdom tooth extraction    . Transvaginal tape (tvt) removal N/A 04/28/2014    Procedure: Removal of suburethral mesh;  Surgeon: Delice Lesch, MD;  Location: [facility];  Service: Gynecology;  Laterality: N/A;   Family History  Problem Relation Age of Onset  . Melanoma Father 72  . Ulcerative colitis Mother 93  . Breast cancer Paternal Grandmother   . Diabetes type II Maternal Grandmother     deceased  45  . Pulmonary embolism Paternal Grandfather    History  Substance Use Topics  . Smoking status: Never Smoker   . Smokeless tobacco: Never Used  . Alcohol Use: No   OB History   Grav Para Term Preterm Abortions TAB SAB Ect Mult Living   0 0             Review of Systems  Constitutional: Negative for fever and chills.  HENT: Negative.   Respiratory: Positive for shortness of breath. Negative for cough.   Cardiovascular: Positive for chest pain.  Gastrointestinal: Negative.  Negative for vomiting and abdominal pain.  Musculoskeletal: Negative.   Skin: Negative.  Negative for color change.  Neurological: Negative.       Allergies  Codeine; Darvocet; Hydrocodone-acetaminophen; Percocet; and Latex  Home Medications   Prior to Admission medications   Medication Sig Start Date End Date Taking? Authorizing Provider  albuterol (PROVENTIL HFA;VENTOLIN HFA) 108 (90 BASE) MCG/ACT inhaler Inhale 2 puffs into the lungs every 6 (six) hours as needed for wheezing or shortness of breath.   Yes Historical Provider, MD  amLODipine (NORVASC) 10 MG tablet Take 10 mg by mouth at bedtime.    Yes Historical Provider, MD  atorvastatin (LIPITOR) 10 MG tablet Take 1 tablet by mouth daily. 01/08/14  Yes Historical Provider, MD  budesonide-formoterol (SYMBICORT) 80-4.5 MCG/ACT inhaler Inhale 2 puffs into the lungs daily.   Yes Historical Provider, MD  HYDROmorphone (DILAUDID) 2 MG tablet Take 1 tablet (2 mg total) by mouth every 3 (three) hours as needed for severe pain. 04/29/14  Yes Delice Lesch, MD  ibuprofen (ADVIL,MOTRIN) 600 MG tablet Take 1 tablet (600 mg total) by mouth every 6 (six) hours as needed (mild pain). 04/29/14  Yes Delice Lesch, MD  INVOKANA 300 MG TABS Take 300 mg by mouth daily.  05/30/13  Yes Historical Provider, MD  LEVEMIR FLEXTOUCH 100 UNIT/ML Pen Inject 25 Units into the skin 2 (two) times daily. 02/04/14  Yes Historical Provider, MD  lisinopril (PRINIVIL,ZESTRIL) 10 MG  tablet Take 30 mg by mouth at bedtime.    Yes Historical Provider, MD  methyldopa (ALDOMET) 250 MG tablet Take 250 mg by mouth daily.   Yes Historical Provider, MD  NOVOLOG FLEXPEN 100 UNIT/ML FlexPen Inject 9 Units into the skin 3 (three) times daily before meals. Takes if suger > 200 01/08/14  Yes Historical Provider, MD  Rivaroxaban (XARELTO) 20 MG TABS Take 20 mg by mouth at bedtime. Patient said MD told her to stop the day prior to surgery   Yes Historical Provider, MD  topiramate (TOPAMAX) 25 MG tablet Take 25 mg by mouth daily. 05/20/13  Yes Historical Provider,  MD   BP 133/96  Pulse 87  Temp(Src) 98.4 F (36.9 C) (Oral)  Resp 14  Ht 5' (1.524 m)  Wt 173 lb (78.472 kg)  BMI 33.79 kg/m2  SpO2 100%  LMP 12/19/2011 Physical Exam  Constitutional: She is oriented to person, place, and time. She appears well-developed and well-nourished.  HENT:  Head: Normocephalic.  Neck: Normal range of motion. Neck supple.  Cardiovascular: Normal rate and regular rhythm.   Pulmonary/Chest: Effort normal and breath sounds normal.  Breath sounds diminished in right mid-upper lobes.  Abdominal: Soft. Bowel sounds are normal. There is no tenderness. There is no rebound and no guarding.  Musculoskeletal: Normal range of motion.  Neurological: She is alert and oriented to person, place, and time.  Skin: Skin is warm and dry. No rash noted.  Psychiatric: She has a normal mood and affect.    ED Course  Procedures (including critical care time) Labs Review Labs Reviewed  [place], ED    Imaging Review No results found.   EKG Interpretation None      MDM   Final diagnoses:  None    1. Chest wall pain  No PE on CT of chest. Labs negative, stable patient with VSS. Likely chest wall pain. Stable for discharge.     Dewaine Oats, PA-C 04/30/14 1954

## 2014-04-30 NOTE — ED Notes (Signed)
Pt ambulates without distress. Respirations easy non labored. 

## 2014-05-02 NOTE — ED Provider Notes (Signed)
Medical screening examination/treatment/procedure(s) were conducted as a shared visit with non-physician practitioner(s) and myself.  I personally evaluated the patient during the encounter.   EKG Interpretation   Date/Time:  Thursday Apr 30 2014 18:14:05 EDT Ventricular Rate:  94 PR Interval:  152 QRS Duration: 69 QT Interval:  345 QTC Calculation: 431 R Axis:   15 Text Interpretation:  Sinus rhythm Borderline T abnormalities, anterior  leads Baseline wander in lead(s) II III ED PHYSICIAN INTERPRETATION  AVAILABLE IN [facility] Confirmed by TEST, Record ([id number]) on  05/02/2014 9:23:57 AM      Results for orders placed during the hospital encounter of 04/30/14  CBC      Result Value Ref Range   WBC 7.9  4.0 - 10.5 K/uL   RBC 4.99  3.87 - 5.11 MIL/uL   Hemoglobin 12.4  12.0 - 15.0 g/dL   HCT 38.4  36.0 - 46.0 %   MCV 77.0 (*) 78.0 - 100.0 fL   MCH 24.8 (*) 26.0 - 34.0 pg   MCHC 32.3  30.0 - 36.0 g/dL   RDW 15.3  11.5 - 15.5 %   Platelets 277  150 - 400 K/uL  BASIC METABOLIC PANEL      Result Value Ref Range   Sodium 138  137 - 147 mEq/L   Potassium 4.2  3.7 - 5.3 mEq/L   Chloride 99  96 - 112 mEq/L   CO2 23  19 - 32 mEq/L   Glucose, Bld 274 (*) 70 - 99 mg/dL   BUN 8  6 - 23 mg/dL   Creatinine, Ser 0.62  0.50 - 1.10 mg/dL   Calcium 9.4  8.4 - 10.5 mg/dL   GFR calc non Af Amer >90  >90 mL/min   GFR calc Af Amer >90  >90 mL/min  I-STAT TROPOININ, ED      Result Value Ref Range   Troponin i, poc 0.00  0.00 - 0.08 ng/mL   Comment 3            Ct Angio Chest W/cm &/or Wo Cm  04/30/2014   CLINICAL DATA:  Right-sided chest pain, recent surgery for removal of bladder mesh.  EXAM: CT ANGIOGRAPHY CHEST WITH CONTRAST  TECHNIQUE: Multidetector CT imaging of the chest was performed using the standard protocol during bolus administration of intravenous contrast. Multiplanar CT image reconstructions and MIPs were obtained to evaluate the vascular anatomy.  CONTRAST:  188mL  OMNIPAQUE IOHEXOL 350 MG/ML SOLN  COMPARISON:  None.  FINDINGS: There are no filling defects within the pulmonary arteries to suggest acute pulmonary embolism. There is a small web-like synechiae within the left lower lobe pulmonary artery which suggests a remote pulmonary infarction. This is present on multiple comparison exams back to 2011 (image 38, series 4).  No acute findings aorta great vessels. No pericardial fluid. Esophagus is normal.  No axillary supraclavicular adenopathy. No mediastinal hilar adenopathy.  View of the upper abdomen demonstrates normal adrenal glands.  Review of the lung parenchyma demonstrates no pneumothorax or pleural fluid. There is mild atelectasis  Review of the MIP images confirms the above findings.  IMPRESSION: 1. No acute pulmonary embolism. 2. Mild atelectasis.   Electronically Signed   By: Suzy Bouchard M.D.   On: 04/30/2014 19:49    Patient seen by me presents with chest pain. Extensive work up negative to include CT Angio chest. No evidence of acute cardiac event, PE or pneumothorax. Patient can be discharged home.   Fredia Sorrow, MD 05/02/14  1832 

## 2014-05-03 NOTE — Discharge Summary (Signed)
Physician Discharge Summary  Patient ID: Makayla Frazier MRN: [id number] DOB/AGE: 12-Sep-1971 43 y.o.  Admit date: 04/28/2014 Discharge date: 05/03/2014   Discharge Diagnoses: Vaginal Pain,  S/P Placement of Tension Free Vaginal Tape Active Problems:   S/P gynecological surgery, follow-up exam   Operation: Removal of Vaginal Mesh   Discharged Condition: Good  Hospital Course: On the date of admission the patient underwent the aforementioned procedure and tolerated it well.  Post operative course was marked by seizure episodes and the patient being place in AICU for observation.  The seizure activity appeared transient and had resolved by post operative day #1 and patient was deemed ready for discharge home once bowel and bladder function resumed.   Disposition: 01-Home or Self Care  Discharge Medications:    Medication List         albuterol 108 (90 BASE) MCG/ACT inhaler  Commonly known as:  PROVENTIL HFA;VENTOLIN HFA  Inhale 2 puffs into the lungs every 6 (six) hours as needed for wheezing or shortness of breath.     amLODipine 10 MG tablet  Commonly known as:  NORVASC  Take 10 mg by mouth at bedtime.     atorvastatin 10 MG tablet  Commonly known as:  LIPITOR  Take 1 tablet by mouth daily.     budesonide-formoterol 80-4.5 MCG/ACT inhaler  Commonly known as:  SYMBICORT  Inhale 2 puffs into the lungs daily.     HYDROmorphone 2 MG tablet  Commonly known as:  DILAUDID  Take 1 tablet (2 mg total) by mouth every 3 (three) hours as needed for severe pain.     ibuprofen 600 MG tablet  Commonly known as:  ADVIL,MOTRIN  Take 1 tablet (600 mg total) by mouth every 6 (six) hours as needed (mild pain).     INVOKANA 300 MG Tabs  Generic drug:  Canagliflozin  Take 300 mg by mouth daily.     LEVEMIR FLEXTOUCH 100 UNIT/ML Pen  Generic drug:  Insulin Detemir  Inject 25 Units into the skin 2 (two) times daily.     lisinopril 10 MG tablet  Commonly known as:  PRINIVIL,ZESTRIL   Take 30 mg by mouth at bedtime.     methyldopa 250 MG tablet  Commonly known as:  ALDOMET  Take 250 mg by mouth daily.     NOVOLOG FLEXPEN 100 UNIT/ML FlexPen  Generic drug:  insulin aspart  Inject 9 Units into the skin 3 (three) times daily before meals. Takes if suger > 200     topiramate 25 MG tablet  Commonly known as:  TOPAMAX  Take 25 mg by mouth daily.     XARELTO 20 MG Tabs tablet  Generic drug:  rivaroxaban  Take 20 mg by mouth at bedtime. Patient said MD told her to stop the day prior to surgery         Follow-up:  Dr. Harvie Bridge. Mancel Bale, May 13, 2014 at 10:15 a.m.    Signed: Earnstine Regal, PA-C 05/03/2014, 9:26 PM

## 2014-05-07 ENCOUNTER — Encounter: Payer: Self-pay | Admitting: Neurology

## 2014-05-07 ENCOUNTER — Ambulatory Visit (INDEPENDENT_AMBULATORY_CARE_PROVIDER_SITE_OTHER): Payer: Medicare Other | Admitting: Neurology

## 2014-05-07 VITALS — BP 139/97 | HR 91 | Ht 60.0 in | Wt 170.0 lb

## 2014-05-07 DIAGNOSIS — R569 Unspecified convulsions: Secondary | ICD-10-CM

## 2014-05-07 MED ORDER — RIZATRIPTAN BENZOATE 5 MG PO TBDP
5.0000 mg | ORAL_TABLET | ORAL | Status: DC | PRN
Start: 2014-05-07 — End: 2020-12-22

## 2014-05-07 MED ORDER — TOPIRAMATE 100 MG PO TABS: ORAL_TABLET | ORAL | Status: DC

## 2014-05-07 NOTE — Progress Notes (Signed)
PATIENT: Makayla Frazier DOB: 1971/08/24  HISTORICAL  Makayla Frazier is a 43 years old right-handed African American female, referred by her OB/GYN Dr. Su Hilt, and primary care nurse practitioner Arturo Morton for evaluation of seizure-like event.  She had transvaginal tape procedure, and cystoscope in February 13 2014, under general anesthesia, postprocedure, she was noticed to have seizure-like event, right arm uncontrollable shaking, she could not recall details, she had second surgery in Apr 28 2014, to remove the suburethral mesh, under general anesthesia, post procedure, she had recurrent seizure-like events again, she is referred for evaluation of seizure  She had past medical history of insulin-dependent diabetes, hypertension, hyperlipidemia, multiple recurrent pulmonary emboli, it was contributed to contraceptives, she is now taking long-term anticoagulation Kerin Salen, multiple repeat CT angiogram for evaluation of her complaints of chest pain, shortness of breath, failed to demonstrate recurrent pulmonary emboli, most recent CT angiogram was in May 2015, she had 3 sets since October 2014, I also reviewed previous MRI of the brain without contrast in 2014, generalized atrophy, no acute lesions,  She began to have transient loss of consciousness  Since 2009, couple times a year, often preceded by feeling hot, followed by uncontrollable body transient shaking, loss of consciousness, had extensive evaluation in the past, I had evaluated her in March 2012, for similar episode, at that time, EEG was normal, she was also evaluated by cardiologist, echocardiogram was reported normal, Loop recording failed to demonstrate etiology, she used to work as a Surveyor, mining, now on disability, is not allowed to drive her personal vehicle either because recurrent episode of loss of consciousness,  She also complains few years history of frequent headaches, increased frequency over past 6 months,  has been taking daily ibuprofen, Advil, left occipital area spreading pounding headaches, with associated light noise sensitivity, lasting for a few hours, she never triptan  treatment in the past, is taking Topamax 25 mg every day, tolerating medication well, no significant side effect.   REVIEW OF SYSTEMS: Full 14 system review of systems performed and notable only for fatigue, chest pain, swelling in legs, blurred vision, shortness of breath, incontinence, urination problems, incontinence, anemia, easy bruising, increased thirst, trying pain, cramps, achy muscles, energy, headaches, weakness, dizziness, seizure, passing out, tremor, insomnia, sleepiness, restless legs, much sleep.  ALLERGIES: Allergies  Allergen Reactions  . Codeine Nausea And Vomiting       . Darvocet [Propoxyphene N-Acetaminophen] Nausea And Vomiting    vomiting  . Hydrocodone-Acetaminophen Nausea And Vomiting    Vomiting   . Percocet [Oxycodone-Acetaminophen] Nausea And Vomiting    vomiting  . Latex Rash    rash    HOME MEDICATIONS: Current Outpatient Prescriptions on File Prior to Visit  Medication Sig Dispense Refill  . albuterol (PROVENTIL HFA;VENTOLIN HFA) 108 (90 BASE) MCG/ACT inhaler Inhale 2 puffs into the lungs every 6 (six) hours as needed for wheezing or shortness of breath.      Marland Kitchen amLODipine (NORVASC) 10 MG tablet Take 10 mg by mouth at bedtime.       Marland Kitchen atorvastatin (LIPITOR) 10 MG tablet Take 1 tablet by mouth daily.      . budesonide-formoterol (SYMBICORT) 80-4.5 MCG/ACT inhaler Inhale 2 puffs into the lungs daily.      Marland Kitchen HYDROmorphone (DILAUDID) 2 MG tablet Take 1 tablet (2 mg total) by mouth every 3 (three) hours as needed for severe pain.  20 tablet  0  . ibuprofen (ADVIL,MOTRIN) 600 MG tablet Take 1 tablet (600  mg total) by mouth every 6 (six) hours as needed (mild pain).  30 tablet  0  . INVOKANA 300 MG TABS Take 300 mg by mouth daily.       Marland Kitchen LEVEMIR FLEXTOUCH 100 UNIT/ML Pen Inject 25 Units  into the skin 2 (two) times daily.      Marland Kitchen lisinopril (PRINIVIL,ZESTRIL) 10 MG tablet Take 30 mg by mouth at bedtime.       . methyldopa (ALDOMET) 250 MG tablet Take 250 mg by mouth daily.      Marland Kitchen NOVOLOG FLEXPEN 100 UNIT/ML FlexPen Inject 9 Units into the skin 3 (three) times daily before meals. Takes if suger > 200      . Rivaroxaban (XARELTO) 20 MG TABS Take 20 mg by mouth at bedtime. Patient said MD told her to stop the day prior to surgery      . topiramate (TOPAMAX) 25 MG tablet Take 25 mg by mouth daily.       PAST MEDICAL HISTORY: Past Medical History  Diagnosis Date  . PE (pulmonary embolism)     2009 - on oral contraception; multiple  . Syncopal episodes     unknown etiology  . Dysfunctional uterine bleeding     Seen by Dr. Leo Grosser, GYN; pending hysterectomy as of 07/2012  . Asthma   . Obesity   . Mastodynia   . Hypertension   . Post - coital bleeding 2012  . Labial lesion 2013  . BV (bacterial vaginosis) 2012  . Oligomenorrhea 2011  . DVT (deep venous thrombosis) 2011    pt had IUD; also 05/2012  . H/O hypercoagulable state     2/2 contraception  . HLD (hyperlipidemia)   . DM (diabetes mellitus)     diagnosed 2009  . Anemia   . Herpes simplex without mention of complication 01/5851    HSV-2  . Thyroid disease     hypothyroidism (pt denies)  . Vitamin D deficiency     unknown to pt.  . Major depression     Hx suicide attempt in 2009, [facility] admissions  . Venous insufficiency   . Sleep apnea   . Neuropathy   . Stroke     TIA 2013  . Headache(784.0)     migraines  . Neuromuscular disorder     neuropathy  . Arthritis     Left leg  . Status post placement of implantable loop recorder   . Complication of anesthesia     Seizures in PACU after surgery 3/15  . Seizures 2015    last episode May 2015    PAST SURGICAL HISTORY: Past Surgical History  Procedure Laterality Date  . Breast reduction surgery      Dr. Eugene Garnet [facility] 2011  . Cholecystectomy    . Cardiac  electrophysiology study & dft      Implantable Loop recorder; no arrhythmias associated with synocpal spells; loop explanted 07-2013  . Endometrial biopsy  2011  . Dilatation & currettage/hysteroscopy with resectocope  2007 & 2013  . Laparoscopic assisted vaginal hysterectomy  12/18/2012    Procedure: LAPAROSCOPIC ASSISTED VAGINAL HYSTERECTOMY;  Surgeon: Eldred Manges, MD;  Location: [facility];  Service: Gynecology;  Laterality: N/A;  . Iud removal  12/18/2012    Procedure: INTRAUTERINE DEVICE (IUD) REMOVAL;  Surgeon: Eldred Manges, MD;  Location: [facility];  Service: Gynecology;  Laterality: N/A;  Removed during prep by E. Florene Glen PA  . Bilateral salpingectomy  12/18/2012    Procedure: BILATERAL SALPINGECTOMY;  Surgeon:  Eldred Manges, MD;  Location: [facility];  Service: Gynecology;  Laterality: Bilateral;  . Abdominal hysterectomy    . Bilateral oophorectomy    . Bladder suspension N/A 02/13/2014    Procedure: TRANSVAGINAL TAPE (TVT) PROCEDURE;  Surgeon: Delice Lesch, MD;  Location: [facility];  Service: Gynecology;  Laterality: N/A;  . Wisdom tooth extraction    . Transvaginal tape (tvt) removal N/A 04/28/2014    Procedure: Removal of suburethral mesh;  Surgeon: Delice Lesch, MD;  Location: [facility];  Service: Gynecology;  Laterality: N/A;    FAMILY HISTORY: Family History  Problem Relation Age of Onset  . Melanoma Father 52  . Ulcerative colitis Mother 102  . Breast cancer Paternal Grandmother   . Diabetes type II Maternal Grandmother     deceased 18  . Pulmonary embolism Paternal Grandfather     SOCIAL HISTORY:  History   Social History  . Marital Status: Single    Spouse Name: N/A    Number of Children: N/A  . Years of Education: N/A   Occupational History  . 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 Smoking status: Never Smoker   . Smokeless tobacco: Never Used  . Alcohol Use: No  . Drug Use: No  . Sexual Activity: Yes     Birth Control/ Protection: Surgical   Other Topics Concern  . Not on file   Social History Narrative   Lives in [place]   Lives with significant other, Gwyndolyn Saxon   Completed 10th grade.   Worker Compensation Case 2009-2012 related to PE     PHYSICAL EXAM   Filed Vitals:   05/07/14 0934  BP: 139/97  Pulse: 91  Height: 5' (1.524 m)  Weight: 170 lb (77.111 kg)    Not recorded    Body mass index is 33.2 kg/(m^2).   Generalized: In no acute distress  Neck: Supple, no carotid bruits   Cardiac: Regular rate rhythm  Pulmonary: Clear to auscultation bilaterally  Musculoskeletal: No deformity  Neurological examination  Mentation: Alert oriented to time, place, history taking, and causual conversation  Cranial nerve II-XII: Pupils were equal round reactive to light. Extraocular movements were full.  Visual field were full on confrontational test. Bilateral fundi were sharp.  Facial sensation and strength were normal. Hearing was intact to finger rubbing bilaterally. Uvula tongue midline.  Head turning and shoulder shrug and were normal and symmetric.Tongue protrusion into cheek strength was normal.  Motor: Normal tone, bulk and strength.  Sensory: Intact to fine touch, pinprick, preserved vibratory sensation, and proprioception at toes.  Coordination: Normal finger to nose, heel-to-shin bilaterally there was no truncal ataxia  Gait: Rising up from seated position without assistance, normal stance, without trunk ataxia, moderate stride, good arm swing, smooth turning, able to perform tiptoe, and heel walking without difficulty.   Romberg signs: Negative  Deep tendon reflexes: Brachioradialis 2/2, biceps 2/2, triceps 2/2, patellar 2/2, Achilles 2/2, plantar responses were flexor bilaterally.   DIAGNOSTIC DATA (LABS, IMAGING, TESTING) - I reviewed patient records, labs, notes, testing and imaging myself where available.  Lab Results  Component Value Date   WBC 7.9 04/30/2014     HGB 12.4 04/30/2014   HCT 38.4 04/30/2014   MCV 77.0* 04/30/2014   PLT 277 04/30/2014      Component Value Date/Time   NA 138 04/30/2014 1835   NA 136 12/26/2012 1318   K 4.2 04/30/2014 1835  K 4.0 12/26/2012 1318   CL 99 04/30/2014 1835   CL 100 12/26/2012 1318   CO2 23 04/30/2014 1835   CO2 27 12/26/2012 1318   GLUCOSE 274* 04/30/2014 1835   GLUCOSE 271* 12/26/2012 1318   BUN 8 04/30/2014 1835   BUN 7.0 12/26/2012 1318   CREATININE 0.62 04/30/2014 1835   CREATININE 0.9 12/26/2012 1318   CREATININE 0.64 01/26/2011 1030   CALCIUM 9.4 04/30/2014 1835   CALCIUM 9.3 12/26/2012 1318   PROT 6.8 04/28/2014 2006   PROT 7.6 12/26/2012 1318   ALBUMIN 3.2* 04/28/2014 2006   ALBUMIN 3.1* 12/26/2012 1318   AST 26 04/28/2014 2006   AST 18 12/26/2012 1318   ALT 25 04/28/2014 2006   ALT 33 12/26/2012 1318   ALKPHOS 66 04/28/2014 2006   ALKPHOS 60 12/26/2012 1318   BILITOT <0.2* 04/28/2014 2006   BILITOT 0.31 12/26/2012 1318   GFRNONAA >90 04/30/2014 1835   GFRAA >90 04/30/2014 1835   Lab Results  Component Value Date   CHOL 146 08/04/2012   HDL 36* 08/04/2012   LDLCALC 90 08/04/2012   TRIG 99 08/04/2012   CHOLHDL 4.1 08/04/2012   Lab Results  Component Value Date   HGBA1C 10.7* 02/13/2014   Lab Results  Component Value Date   WLNLGXQJ19 417 08/04/2012   Lab Results  Component Value Date   TSH 1.056 08/03/2012      ASSESSMENT AND PLAN  Makayla Frazier is a 43 y.o. female complains of  seizure-like event, multiple recurrent episodes post surgery, anesthesia, normal neurological examination  1, repeat EEG 2. Increase her Topamax to 100 mg twice a day 3. She also complains of frequent headaches, with migraine features, we will try Maxalt as needed 4. Return to clinic with Hoyle Sauer in 3 months   Marcial Pacas, M.D. Ph.D.  [facility] [address], [place], [place] [address] [phone]

## 2014-05-14 ENCOUNTER — Other Ambulatory Visit: Payer: Medicare Other | Admitting: Radiology

## 2014-05-19 ENCOUNTER — Ambulatory Visit (INDEPENDENT_AMBULATORY_CARE_PROVIDER_SITE_OTHER): Payer: Medicare Other | Admitting: Radiology

## 2014-05-19 DIAGNOSIS — R569 Unspecified convulsions: Secondary | ICD-10-CM

## 2014-05-29 NOTE — Procedures (Signed)
   HISTORY:  43 years old female, complains of seizure like event, multiple recurrent episodes postsurgically, while recovering from anesthesia  TECHNIQUE:  16 channel EEG was performed based on standard 10-16 international system. One channel was dedicated to EKG, which has demonstrates normal sinus rhythm of 84 beats per minutes.  Upon awakening, the posterior background activity was well-developed, in alpha range 9 Hz, with amplitude of 60 microvoltage, reactive to eye opening and closure.  There was no evidence of epileptiform discharge.  Photic stimulation was performed, which induced a symmetric photic driving.  Hyperventilation was performed, there was no abnormality elicit.  Stage II sleep was achieved.  CONCLUSION: This is a  normal awake and asleep EEG.  There is no electrodiagnostic evidence of epileptiform discharge

## 2014-08-24 ENCOUNTER — Encounter: Payer: Self-pay | Admitting: *Deleted

## 2014-08-24 ENCOUNTER — Encounter: Payer: Medicare Other | Attending: Internal Medicine | Admitting: *Deleted

## 2014-08-24 VITALS — Ht 60.0 in | Wt 170.3 lb

## 2014-08-24 DIAGNOSIS — Z794 Long term (current) use of insulin: Secondary | ICD-10-CM | POA: Insufficient documentation

## 2014-08-24 DIAGNOSIS — E119 Type 2 diabetes mellitus without complications: Secondary | ICD-10-CM

## 2014-08-24 DIAGNOSIS — Z713 Dietary counseling and surveillance: Secondary | ICD-10-CM | POA: Insufficient documentation

## 2014-08-24 NOTE — Progress Notes (Signed)
  Medical Nutrition Therapy:  Appt start time: 0915 end time:  2409.   Assessment:  Patient here today for diabetes education. She was diagnosed with diabetes in 2009. She has struggled to control her blood glucose with an A1c of 12.3 in July 2015. She attributes this to stress from two surgeries with complications. She recently changed some of her medications, taking Invokamet, Levemir, and sliding scale Novolog. She does report that she decreased the units of Levemir due to fasting BG in the 70s. She also only takes Novolog when BG is over 200. She checks her BG twice daily with fasting BG in the 140s and bedtime BG >200. This is an improvement from 300-400s. She has changed her eating habits over the last year, watching portion size, eliminating sweetened drinks, and exercising 2 days weekly, and has lost about 10 pounds in a year. She does continue to drink 8-12 ounces of juice daily. She reports knee problems, which limit her activity. Goal weight is 155 pounds.   MEDICATIONS: Invokamet, Levemir 25 units twice daily, Novolog   DIETARY INTAKE:   Usual eating pattern includes 3 meals and 2-3 snacks per day.  24-hr recall:  B ( AM): Oatmeal OR Honey Nut Cheerios, light milk, juice/water  Snk ( AM): Applesauce, canned peaches, pears, grapes, occasionally chips L ( PM): Sandwich (deli meat, cheese, mayo), fruit (applesauce, peaches), water/juice watered down Snk ( PM): Same D ( PM): Baked/grilled chicken breast/turkey/salmon, green beans/peas/salad, rice/pasta, juice/water Snk ( PM): Same Beverages: Water, juice, rarely soda, 15 calorie Minute Maid  Usual physical activity: Treadmill 30 minutes 2 days a week  Estimated energy needs: 1500 calories 188 g carbohydrates 94 g protein 42 g fat  Progress Towards Goal(s):  In progress.   Nutritional Diagnosis:  NB-1.1 Food and nutrition-related knowledge deficit As related to diabetes.  As evidenced by A1c 12.3.    Intervention:  Nutrition  counseling. We discussed basic carb counting, including foods with carbs, label reading, portion size, and meal planning.   Goals:  1. 2-3 carb servings per meal, 1 serving per snack.  2. Measure portion size of foods, specifically carbs at breakfast and dinner.  3. Decrease intake of fruit juice.  4. Use plate method for meal planning.  5. Consider increasing exercise to 20-30 minutes 3 days weekly.    Handouts given during visit include:  Carbohydrate counting for diabetes  Meal plan card  Monitoring/Evaluation:  Dietary intake, exercise, blood glucose, and body weight prn.

## 2014-09-10 ENCOUNTER — Ambulatory Visit: Payer: Medicare Other | Admitting: Nurse Practitioner

## 2014-10-10 ENCOUNTER — Emergency Department (HOSPITAL_COMMUNITY)
Admission: EM | Admit: 2014-10-10 | Discharge: 2014-10-10 | Disposition: A | Discharge status: Home / Self Care | Payer: Medicare Other | Attending: Emergency Medicine | Admitting: Emergency Medicine

## 2014-10-10 ENCOUNTER — Encounter (HOSPITAL_COMMUNITY): Payer: Self-pay | Admitting: Emergency Medicine

## 2014-10-10 DIAGNOSIS — Z79899 Other long term (current) drug therapy: Secondary | ICD-10-CM | POA: Diagnosis not present

## 2014-10-10 DIAGNOSIS — Z8742 Personal history of other diseases of the female genital tract: Secondary | ICD-10-CM | POA: Diagnosis not present

## 2014-10-10 DIAGNOSIS — Z862 Personal history of diseases of the blood and blood-forming organs and certain disorders involving the immune mechanism: Secondary | ICD-10-CM | POA: Diagnosis not present

## 2014-10-10 DIAGNOSIS — Z86718 Personal history of other venous thrombosis and embolism: Secondary | ICD-10-CM | POA: Insufficient documentation

## 2014-10-10 DIAGNOSIS — Z3202 Encounter for pregnancy test, result negative: Secondary | ICD-10-CM | POA: Diagnosis not present

## 2014-10-10 DIAGNOSIS — I10 Essential (primary) hypertension: Secondary | ICD-10-CM | POA: Insufficient documentation

## 2014-10-10 DIAGNOSIS — Z8619 Personal history of other infectious and parasitic diseases: Secondary | ICD-10-CM | POA: Insufficient documentation

## 2014-10-10 DIAGNOSIS — E785 Hyperlipidemia, unspecified: Secondary | ICD-10-CM | POA: Diagnosis not present

## 2014-10-10 DIAGNOSIS — J45909 Unspecified asthma, uncomplicated: Secondary | ICD-10-CM | POA: Diagnosis not present

## 2014-10-10 DIAGNOSIS — Z8659 Personal history of other mental and behavioral disorders: Secondary | ICD-10-CM | POA: Insufficient documentation

## 2014-10-10 DIAGNOSIS — R569 Unspecified convulsions: Secondary | ICD-10-CM

## 2014-10-10 DIAGNOSIS — Z86711 Personal history of pulmonary embolism: Secondary | ICD-10-CM | POA: Insufficient documentation

## 2014-10-10 DIAGNOSIS — Z7901 Long term (current) use of anticoagulants: Secondary | ICD-10-CM | POA: Insufficient documentation

## 2014-10-10 DIAGNOSIS — G40909 Epilepsy, unspecified, not intractable, without status epilepticus: Secondary | ICD-10-CM | POA: Insufficient documentation

## 2014-10-10 DIAGNOSIS — E119 Type 2 diabetes mellitus without complications: Secondary | ICD-10-CM | POA: Insufficient documentation

## 2014-10-10 DIAGNOSIS — Z8739 Personal history of other diseases of the musculoskeletal system and connective tissue: Secondary | ICD-10-CM | POA: Diagnosis not present

## 2014-10-10 DIAGNOSIS — E669 Obesity, unspecified: Secondary | ICD-10-CM | POA: Diagnosis not present

## 2014-10-10 DIAGNOSIS — Z8673 Personal history of transient ischemic attack (TIA), and cerebral infarction without residual deficits: Secondary | ICD-10-CM | POA: Insufficient documentation

## 2014-10-10 LAB — I-STAT CHEM 8, ED
BUN: 5 mg/dL — AB (ref 6–23)
CHLORIDE: 101 meq/L (ref 96–112)
Calcium, Ion: 1.14 mmol/L (ref 1.12–1.23)
Creatinine, Ser: 0.8 mg/dL (ref 0.50–1.10)
Glucose, Bld: 228 mg/dL — ABNORMAL HIGH (ref 70–99)
HCT: 45 % (ref 36.0–46.0)
Hemoglobin: 15.3 g/dL — ABNORMAL HIGH (ref 12.0–15.0)
Potassium: 4 mEq/L (ref 3.7–5.3)
SODIUM: 137 meq/L (ref 137–147)
TCO2: 24 mmol/L (ref 0–100)

## 2014-10-10 LAB — CBC
HEMATOCRIT: 41.3 % (ref 36.0–46.0)
HEMOGLOBIN: 13.5 g/dL (ref 12.0–15.0)
MCH: 25.5 pg — AB (ref 26.0–34.0)
MCHC: 32.7 g/dL (ref 30.0–36.0)
MCV: 77.9 fL — AB (ref 78.0–100.0)
Platelets: 342 10*3/uL (ref 150–400)
RBC: 5.3 MIL/uL — ABNORMAL HIGH (ref 3.87–5.11)
RDW: 15.5 % (ref 11.5–15.5)
WBC: 11.4 10*3/uL — ABNORMAL HIGH (ref 4.0–10.5)

## 2014-10-10 LAB — BASIC METABOLIC PANEL
ANION GAP: 13 (ref 5–15)
BUN: 7 mg/dL (ref 6–23)
CALCIUM: 8.9 mg/dL (ref 8.4–10.5)
CO2: 21 meq/L (ref 19–32)
CREATININE: 0.57 mg/dL (ref 0.50–1.10)
Chloride: 98 mEq/L (ref 96–112)
GFR calc Af Amer: >90 mL/min (ref >90)
GFR calc non Af Amer: >90 mL/min (ref >90)
Glucose, Bld: 246 mg/dL — ABNORMAL HIGH (ref 70–99)
Potassium: >7.7 mEq/L (ref 3.7–5.3)
Sodium: 132 mEq/L — ABNORMAL LOW (ref 137–147)

## 2014-10-10 LAB — CBG MONITORING, ED: GLUCOSE-CAPILLARY: 238 mg/dL — AB (ref 70–99)

## 2014-10-10 LAB — POC URINE PREG, ED: Preg Test, Ur: NEGATIVE

## 2014-10-10 LAB — POTASSIUM: Potassium: 4.7 mEq/L (ref 3.7–5.3)

## 2014-10-10 NOTE — ED Notes (Addendum)
Per ems pt hx of seizures. Pt was walking to the bathroom at a funeral, has been stressed and very emotional lately. Reports at funeral seizure and fell, ems said in truck pt had seizure like activity, pt rolled eye back and locked up body for 5-10 seconds while in truck, no postictal phase. No incontinence noted, no tongue injury noted. Alert and oriented x4. Reports last seizure was 1 month ago.

## 2014-10-10 NOTE — ED Notes (Addendum)
Pt started having seizure like activity, body was shaking slightly. Pt was crying. No tongue injury or incontinence. Shaking stopped after 30 seconds, pt sat up in bed. Pt c/o headache 7/10

## 2014-10-10 NOTE — ED Provider Notes (Signed)
Medical screening examination/treatment/procedure(s) were conducted as a shared visit with non-physician practitioner(s) and myself.  I personally evaluated the patient during the encounter.   EKG Interpretation   Date/Time:  Saturday October 10 2014 16:12:51 EDT Ventricular Rate:  91 PR Interval:  148 QRS Duration: 69 QT Interval:  355 QTC Calculation: 437 R Axis:   38 Text Interpretation:  Sinus rhythm Low voltage, precordial leads No  significant change since last tracing Confirmed by Elige Shouse  MD, [place]  ([id number]) on 10/10/2014 4:23:37 PM      I interviewed and examined the patient. Lungs are CTAB. Cardiac exam wnl. Abdomen soft.  Pt feeling better after seizure. Back to baseline. Does have fairly freq seizures. I interpreted/reviewed the labs and/or imaging which were non-contributory.  Pt continues to appear well. VS unremarkable.  Denies head injury, or lack of sleep. Will rec f/u w/ her neurologist.   Pamella Pert, MD 10/10/14 1931

## 2014-10-10 NOTE — ED Notes (Signed)
Bed: PY19 Expected date: 10/10/14 Expected time: 2:34 PM Means of arrival: Ambulance Comments: Seizure x 3 witnessed

## 2014-10-10 NOTE — ED Provider Notes (Signed)
CSN: [id number]     Arrival date & time 10/10/14  1436 History   First MD Initiated Contact with Patient 10/10/14 1500     Chief Complaint  Patient presents with  . Seizures     (Consider location/radiation/quality/duration/timing/severity/associated sxs/prior Treatment) HPI Comments: Patient with hx of seizure disorder presents to the ED with a chief complaint of seizure.  Patient was at a funeral today, when she went to the bathroom and had a seizure.  She denies any pain or injury 2/2 the seizure.  EMS was called, and reportedly she had another episode of shaking in the ambulance, but was not post-ictal afterward.  She states that she feels fine now, but is tired and has had a lot of stress in her life.  She normally takes topiramate for seizure, but states that she recently decreased the dosage from 100 mg to 25 mg BID.  She has been taking her medications as prescribed.  She is followed by Dr. Marcial Pacas.  She denies any headache, chest pain, SOB, nausea, vomiting, or diarrhea.  There are no aggravating or alleviating factors.  Nothing makes her symptoms better or worse.  The history is provided by the patient. No language interpreter was used.    Past Medical History  Diagnosis Date  . PE (pulmonary embolism)     2009 - on oral contraception; multiple  . Syncopal episodes     unknown etiology  . Dysfunctional uterine bleeding     Seen by Dr. Leo Grosser, GYN; pending hysterectomy as of 07/2012  . Asthma   . Obesity   . Mastodynia   . Hypertension   . Post - coital bleeding 2012  . Labial lesion 2013  . BV (bacterial vaginosis) 2012  . Oligomenorrhea 2011  . DVT (deep venous thrombosis) 2011    pt had IUD; also 05/2012  . H/O hypercoagulable state     2/2 contraception  . HLD (hyperlipidemia)   . DM (diabetes mellitus)     diagnosed 2009  . Anemia   . Herpes simplex without mention of complication 0/9983    HSV-2  . Thyroid disease     hypothyroidism (pt denies)  . Vitamin D  deficiency     unknown to pt.  . Major depression     Hx suicide attempt in 2009, [facility] admissions  . Venous insufficiency   . Sleep apnea   . Neuropathy   . Stroke     TIA 2013  . Headache(784.0)     migraines  . Neuromuscular disorder     neuropathy  . Arthritis     Left leg  . Status post placement of implantable loop recorder   . Complication of anesthesia     Seizures in PACU after surgery 3/15  . Seizures 2015    last episode May 2015   Past Surgical History  Procedure Laterality Date  . Breast reduction surgery      Dr. Eugene Garnet [facility] 2011  . Cholecystectomy    . Cardiac electrophysiology study & dft      Implantable Loop recorder; no arrhythmias associated with synocpal spells; loop explanted 07-2013  . Endometrial biopsy  2011  . Dilatation & currettage/hysteroscopy with resectocope  2007 & 2013  . Laparoscopic assisted vaginal hysterectomy  12/18/2012    Procedure: LAPAROSCOPIC ASSISTED VAGINAL HYSTERECTOMY;  Surgeon: Eldred Manges, MD;  Location: [facility];  Service: Gynecology;  Laterality: N/A;  . Iud removal  12/18/2012    Procedure: INTRAUTERINE DEVICE (IUD) REMOVAL;  Surgeon: Eldred Manges, MD;  Location: [facility];  Service: Gynecology;  Laterality: N/A;  Removed during prep by E. Florene Glen PA  . Bilateral salpingectomy  12/18/2012    Procedure: BILATERAL SALPINGECTOMY;  Surgeon: Eldred Manges, MD;  Location: [facility];  Service: Gynecology;  Laterality: Bilateral;  . Abdominal hysterectomy    . Bilateral oophorectomy    . Bladder suspension N/A 02/13/2014    Procedure: TRANSVAGINAL TAPE (TVT) PROCEDURE;  Surgeon: Delice Lesch, MD;  Location: [facility];  Service: Gynecology;  Laterality: N/A;  . Wisdom tooth extraction    . Transvaginal tape (tvt) removal N/A 04/28/2014    Procedure: Removal of suburethral mesh;  Surgeon: Delice Lesch, MD;  Location: [facility];  Service: Gynecology;  Laterality: N/A;   Family History  Problem Relation Age of Onset  . Melanoma  Father 78  . Ulcerative colitis Mother 87  . Breast cancer Paternal Grandmother   . Diabetes type II Maternal Grandmother     deceased 54  . Pulmonary embolism Paternal Grandfather    History  Substance Use Topics  . Smoking status: Never Smoker   . Smokeless tobacco: Never Used  . Alcohol Use: No   OB History   Grav Para Term Preterm Abortions TAB SAB Ect Mult Living   0 0             Review of Systems  Constitutional: Negative for fever and chills.  Respiratory: Negative for shortness of breath.   Cardiovascular: Negative for chest pain.  Gastrointestinal: Negative for nausea, vomiting, diarrhea and constipation.  Genitourinary: Negative for dysuria.  Neurological: Positive for seizures.  All other systems reviewed and are negative.     Allergies  Codeine; Darvocet; Hydrocodone-acetaminophen; Percocet; and Latex  Home Medications   Prior to Admission medications   Medication Sig Start Date End Date Taking? Authorizing Provider  albuterol (PROVENTIL HFA;VENTOLIN HFA) 108 (90 BASE) MCG/ACT inhaler Inhale 2 puffs into the lungs every 6 (six) hours as needed for wheezing or shortness of breath.    Historical Provider, MD  amLODipine (NORVASC) 10 MG tablet Take 10 mg by mouth at bedtime.     Historical Provider, MD  atorvastatin (LIPITOR) 10 MG tablet Take 1 tablet by mouth daily. 01/08/14   Historical Provider, MD  budesonide-formoterol (SYMBICORT) 80-4.5 MCG/ACT inhaler Inhale 2 puffs into the lungs daily.    Historical Provider, MD  HYDROmorphone (DILAUDID) 2 MG tablet Take 1 tablet (2 mg total) by mouth every 3 (three) hours as needed for severe pain. 04/29/14   Delice Lesch, MD  ibuprofen (ADVIL,MOTRIN) 600 MG tablet Take 1 tablet (600 mg total) by mouth every 6 (six) hours as needed (mild pain). 04/29/14   Delice Lesch, MD  INVOKAMET 150-500 MG TABS  09/25/14   Historical Provider, MD  INVOKANA 300 MG TABS Take 300 mg by mouth daily.  05/30/13   Historical Provider,  MD  LEVEMIR FLEXTOUCH 100 UNIT/ML Pen Inject 25 Units into the skin 2 (two) times daily. 02/04/14   Historical Provider, MD  lisinopril (PRINIVIL,ZESTRIL) 10 MG tablet Take 30 mg by mouth at bedtime.     Historical Provider, MD  methyldopa (ALDOMET) 250 MG tablet Take 250 mg by mouth daily.    Historical Provider, MD  NOVOLOG FLEXPEN 100 UNIT/ML FlexPen Inject 9 Units into the skin 3 (three) times daily before meals. Takes if suger > 200 01/08/14   Historical Provider, MD  Rivaroxaban (XARELTO) 20 MG TABS Take 20 mg  by mouth at bedtime. Patient said MD told her to stop the day prior to surgery    Historical Provider, MD  rizatriptan (MAXALT-MLT) 5 MG disintegrating tablet Take 1 tablet (5 mg total) by mouth as needed for migraine. May repeat in 2 hours if needed 05/07/14   Marcial Pacas, MD  topiramate (TOPAMAX) 100 MG tablet bid 05/07/14   Marcial Pacas, MD   BP 151/114  Pulse 106  Temp(Src) 98.2 F (36.8 C) (Oral)  Resp 17  SpO2 97%  LMP 12/19/2011 Physical Exam  Nursing note and vitals reviewed. Constitutional: She is oriented to person, place, and time. She appears well-developed and well-nourished.  HENT:  Head: Normocephalic and atraumatic.  Eyes: Conjunctivae and EOM are normal. Pupils are equal, round, and reactive to light.  Neck: Normal range of motion. Neck supple.  Cardiovascular: Normal rate and regular rhythm.  Exam reveals no gallop and no friction rub.   No murmur heard. Pulmonary/Chest: Effort normal and breath sounds normal. No respiratory distress. She has no wheezes. She has no rales. She exhibits no tenderness.  Abdominal: Soft. Bowel sounds are normal. She exhibits no distension and no mass. There is no tenderness. There is no rebound and no guarding.  Musculoskeletal: Normal range of motion. She exhibits no edema and no tenderness.  Neurological: She is alert and oriented to person, place, and time.  CN 3-12 intact, speech is clear, movements are goal oriented  Skin: Skin is  warm and dry.  Psychiatric: She has a normal mood and affect. Her behavior is normal. Judgment and thought content normal.    ED Course  Procedures (including critical care time) Results for orders placed during the hospital encounter of 32/99/24  BASIC METABOLIC PANEL      Result Value Ref Range   Sodium 132 (*) 137 - 147 mEq/L   Potassium >7.7 (*) 3.7 - 5.3 mEq/L   Chloride 98  96 - 112 mEq/L   CO2 21  19 - 32 mEq/L   Glucose, Bld 246 (*) 70 - 99 mg/dL   BUN 7  6 - 23 mg/dL   Creatinine, Ser 0.57  0.50 - 1.10 mg/dL   Calcium 8.9  8.4 - 10.5 mg/dL   GFR calc non Af Amer >90  >90 mL/min   GFR calc Af Amer >90  >90 mL/min   Anion gap 13  5 - 15  CBC      Result Value Ref Range   WBC 11.4 (*) 4.0 - 10.5 K/uL   RBC 5.30 (*) 3.87 - 5.11 MIL/uL   Hemoglobin 13.5  12.0 - 15.0 g/dL   HCT 41.3  36.0 - 46.0 %   MCV 77.9 (*) 78.0 - 100.0 fL   MCH 25.5 (*) 26.0 - 34.0 pg   MCHC 32.7  30.0 - 36.0 g/dL   RDW 15.5  11.5 - 15.5 %   Platelets 342  150 - 400 K/uL  POTASSIUM      Result Value Ref Range   Potassium 4.7  3.7 - 5.3 mEq/L  CBG MONITORING, ED      Result Value Ref Range   Glucose-Capillary 238 (*) 70 - 99 mg/dL  POC URINE PREG, ED      Result Value Ref Range   Preg Test, Ur NEGATIVE  NEGATIVE  I-STAT CHEM 8, ED      Result Value Ref Range   Sodium 137  137 - 147 mEq/L   Potassium 4.0  3.7 - 5.3 mEq/L  Chloride 101  96 - 112 mEq/L   BUN 5 (*) 6 - 23 mg/dL   Creatinine, Ser 0.80  0.50 - 1.10 mg/dL   Glucose, Bld 228 (*) 70 - 99 mg/dL   Calcium, Ion 1.14  1.12 - 1.23 mmol/L   TCO2 24  0 - 100 mmol/L   Hemoglobin 15.3 (*) 12.0 - 15.0 g/dL   HCT 45.0  36.0 - 46.0 %   No results found.   Imaging Review No results found.   EKG Interpretation   Date/Time:  Saturday October 10 2014 16:12:51 EDT Ventricular Rate:  91 PR Interval:  148 QRS Duration: 69 QT Interval:  355 QTC Calculation: 437 R Axis:   38 Text Interpretation:  Sinus rhythm Low voltage, precordial  leads No  significant change since last tracing Confirmed by HARRISON  MD, [place]  ([id number]) on 10/10/2014 4:23:37 PM      MDM   Final diagnoses:  Seizure-like activity    Patient was observed seizure-like activity, however patient did not have any postictal phase. Patient has a history of seizures, and is currently taking Topamax. She is followed by a neurologist in the community. We'll check basic labs, and EKG.  Potassium is elevated, suspect this is hemolysis. Will repeat Chem-8.  Potassium is within normal limits.  Patient is feeling much better. We'll ambulate the patient. Anticipate discharge. Patient seen by and discussed with Dr. Aline Brochure, who agrees with the plan.  Patient relates without difficulty. Will discharge to home. Plan for neurology follow-up next week.    Montine Circle, PA-C 10/10/14 1715

## 2014-10-10 NOTE — ED Notes (Signed)
Pt escorted to discharge window. Pt verbalized understanding discharge instructions. In no acute distress.  

## 2014-10-10 NOTE — ED Notes (Signed)
Witnessed pt seize for about 30 sec

## 2014-10-10 NOTE — Discharge Instructions (Signed)
Seizure, Adult °A seizure is abnormal electrical activity in the brain. Seizures usually last from 30 seconds to 2 minutes. There are various types of seizures. °Before a seizure, you may have a warning sensation (aura) that a seizure is about to occur. An aura may include the following symptoms:  °· Fear or anxiety. °· Nausea. °· Feeling like the room is spinning (vertigo). °· Vision changes, such as seeing flashing lights or spots. °Common symptoms during a seizure include: °· A change in attention or behavior (altered mental status). °· Convulsions with rhythmic jerking movements. °· Drooling. °· Rapid eye movements. °· Grunting. °· Loss of bladder and bowel control. °· Bitter taste in the mouth. °· Tongue biting. °After a seizure, you may feel confused and sleepy. You may also have an injury resulting from convulsions during the seizure. °HOME CARE INSTRUCTIONS  °· If you are given medicines, take them exactly as prescribed by your health care provider. °· Keep all follow-up appointments as directed by your health care provider. °· Do not swim or drive or engage in risky activity during which a seizure could cause further injury to you or others until your health care provider says it is OK. °· Get adequate rest. °· Teach friends and family what to do if you have a seizure. They should: °· Lay you on the ground to prevent a fall. °· Put a cushion under your head. °· Loosen any tight clothing around your neck. °· Turn you on your side. If vomiting occurs, this helps keep your airway clear. °· Stay with you until you recover. °· Know whether or not you need emergency care. °SEEK IMMEDIATE MEDICAL CARE IF: °· The seizure lasts longer than 5 minutes. °· The seizure is severe or you do not wake up immediately after the seizure. °· You have an altered mental status after the seizure. °· You are having more frequent or worsening seizures. °Someone should drive you to the emergency department or call local emergency  services (911 in [place] °MAKE SURE YOU: °· Understand these instructions. °· Will watch your condition. °· Will get help right away if you are not doing well or get worse. °Document Released: 11/24/2000 Document Revised: 09/17/2013 Document Reviewed: 07/09/2013 °Exit[employer]® Patient Information ©2015 [employer]. This information is not intended to replace advice given to you by your health care provider. Make sure you discuss any questions you have with your health care provider. ° °Driving and Equipment Restrictions °Some medical problems make it dangerous to drive, ride a bike, or use machines. Some of these problems are: °· A hard blow to the head (concussion). °· Passing out (fainting). °· Twitching and shaking (seizures). °· Low blood sugar. °· Taking medicine to help you relax (sedatives). °· Taking pain medicines. °· Wearing an eye patch. °· Wearing splints. This can make it hard to use parts of your body that you need to drive safely. °HOME CARE  °· Do not drive until your doctor says it is okay. °· Do not use machines until your doctor says it is okay. °You may need a form signed by your doctor (medical release) before you can drive again. You may also need this form before you do other tasks where you need to be fully alert. °MAKE SURE YOU: °· Understand these instructions. °· Will watch your condition. °· Will get help right away if you are not doing well or get worse. °Document Released: 01/04/2005 Document Revised: 02/19/2012 Document Reviewed: 04/06/2010 °ExitCare® Patient Information ©2015 [employer].   This information is not intended to replace advice given to you by your health care provider. Make sure you discuss any questions you have with your health care provider. ° °

## 2014-10-10 NOTE — ED Notes (Signed)
PA at bedside.

## 2014-11-19 ENCOUNTER — Encounter (HOSPITAL_COMMUNITY): Payer: Self-pay | Admitting: Internal Medicine

## 2015-03-02 ENCOUNTER — Encounter (HOSPITAL_COMMUNITY): Payer: Self-pay | Admitting: Emergency Medicine

## 2015-03-02 ENCOUNTER — Emergency Department (HOSPITAL_COMMUNITY): Payer: Medicare Other

## 2015-03-02 ENCOUNTER — Emergency Department (HOSPITAL_COMMUNITY)
Admission: EM | Admit: 2015-03-02 | Discharge: 2015-03-02 | Disposition: A | Discharge status: Home / Self Care | Payer: Medicare Other | Attending: Emergency Medicine | Admitting: Emergency Medicine

## 2015-03-02 DIAGNOSIS — Z7901 Long term (current) use of anticoagulants: Secondary | ICD-10-CM | POA: Insufficient documentation

## 2015-03-02 DIAGNOSIS — Z79899 Other long term (current) drug therapy: Secondary | ICD-10-CM | POA: Diagnosis not present

## 2015-03-02 DIAGNOSIS — Z86711 Personal history of pulmonary embolism: Secondary | ICD-10-CM | POA: Diagnosis not present

## 2015-03-02 DIAGNOSIS — R0602 Shortness of breath: Secondary | ICD-10-CM | POA: Insufficient documentation

## 2015-03-02 DIAGNOSIS — Z8619 Personal history of other infectious and parasitic diseases: Secondary | ICD-10-CM | POA: Diagnosis not present

## 2015-03-02 DIAGNOSIS — I10 Essential (primary) hypertension: Secondary | ICD-10-CM | POA: Diagnosis not present

## 2015-03-02 DIAGNOSIS — Z8659 Personal history of other mental and behavioral disorders: Secondary | ICD-10-CM | POA: Insufficient documentation

## 2015-03-02 DIAGNOSIS — Z862 Personal history of diseases of the blood and blood-forming organs and certain disorders involving the immune mechanism: Secondary | ICD-10-CM | POA: Insufficient documentation

## 2015-03-02 DIAGNOSIS — R079 Chest pain, unspecified: Secondary | ICD-10-CM | POA: Diagnosis present

## 2015-03-02 DIAGNOSIS — Z7951 Long term (current) use of inhaled steroids: Secondary | ICD-10-CM | POA: Diagnosis not present

## 2015-03-02 DIAGNOSIS — E119 Type 2 diabetes mellitus without complications: Secondary | ICD-10-CM | POA: Diagnosis not present

## 2015-03-02 DIAGNOSIS — Z91040 Latex allergy status: Secondary | ICD-10-CM | POA: Insufficient documentation

## 2015-03-02 DIAGNOSIS — Z86718 Personal history of other venous thrombosis and embolism: Secondary | ICD-10-CM | POA: Insufficient documentation

## 2015-03-02 DIAGNOSIS — Z794 Long term (current) use of insulin: Secondary | ICD-10-CM | POA: Diagnosis not present

## 2015-03-02 DIAGNOSIS — E669 Obesity, unspecified: Secondary | ICD-10-CM | POA: Diagnosis not present

## 2015-03-02 DIAGNOSIS — R0789 Other chest pain: Secondary | ICD-10-CM

## 2015-03-02 DIAGNOSIS — Z8742 Personal history of other diseases of the female genital tract: Secondary | ICD-10-CM | POA: Diagnosis not present

## 2015-03-02 DIAGNOSIS — G40909 Epilepsy, unspecified, not intractable, without status epilepticus: Secondary | ICD-10-CM | POA: Insufficient documentation

## 2015-03-02 DIAGNOSIS — J45909 Unspecified asthma, uncomplicated: Secondary | ICD-10-CM | POA: Diagnosis not present

## 2015-03-02 DIAGNOSIS — E785 Hyperlipidemia, unspecified: Secondary | ICD-10-CM | POA: Insufficient documentation

## 2015-03-02 DIAGNOSIS — G43909 Migraine, unspecified, not intractable, without status migrainosus: Secondary | ICD-10-CM | POA: Diagnosis not present

## 2015-03-02 LAB — BASIC METABOLIC PANEL
Anion gap: 12 (ref 5–15)
BUN: 11 mg/dL (ref 6–23)
CALCIUM: 8.9 mg/dL (ref 8.4–10.5)
CO2: 23 mmol/L (ref 19–32)
CREATININE: 0.81 mg/dL (ref 0.50–1.10)
Chloride: 100 mmol/L (ref 96–112)
GFR calc Af Amer: >90 mL/min (ref >90)
GFR calc non Af Amer: 88 mL/min — ABNORMAL LOW (ref >90)
Glucose, Bld: 228 mg/dL — ABNORMAL HIGH (ref 70–99)
Potassium: 5.2 mmol/L — ABNORMAL HIGH (ref 3.5–5.1)
Sodium: 135 mmol/L (ref 135–145)

## 2015-03-02 LAB — CBC
HEMATOCRIT: 42.7 % (ref 36.0–46.0)
HEMOGLOBIN: 14.1 g/dL (ref 12.0–15.0)
MCH: 25.8 pg — AB (ref 26.0–34.0)
MCHC: 33 g/dL (ref 30.0–36.0)
MCV: 78.2 fL (ref 78.0–100.0)
Platelets: 302 10*3/uL (ref 150–400)
RBC: 5.46 MIL/uL — ABNORMAL HIGH (ref 3.87–5.11)
RDW: 14.9 % (ref 11.5–15.5)
WBC: 12.5 10*3/uL — ABNORMAL HIGH (ref 4.0–10.5)

## 2015-03-02 LAB — BRAIN NATRIURETIC PEPTIDE: B NATRIURETIC PEPTIDE 5: 40.1 pg/mL (ref 0.0–100.0)

## 2015-03-02 LAB — I-STAT TROPONIN, ED: Troponin i, poc: 0.01 ng/mL (ref 0.00–0.08)

## 2015-03-02 MED ORDER — NITROGLYCERIN 0.4 MG SL SUBL
0.4000 mg | SUBLINGUAL_TABLET | SUBLINGUAL | Status: AC | PRN
  Administered 2015-03-02 (×3): 0.4 mg via SUBLINGUAL
  Filled 2015-03-02: qty 1

## 2015-03-02 MED ORDER — ONDANSETRON HCL 4 MG PO TABS: 4.0000 mg | ORAL_TABLET | Freq: Four times a day (QID) | ORAL | Status: DC

## 2015-03-02 MED ORDER — ONDANSETRON HCL 4 MG/2ML IJ SOLN
4.0000 mg | Freq: Once | INTRAMUSCULAR | Status: AC
  Administered 2015-03-02: 4 mg via INTRAVENOUS
  Filled 2015-03-02: qty 2

## 2015-03-02 MED ORDER — OXYCODONE-ACETAMINOPHEN 5-325 MG PO TABS: ORAL_TABLET | ORAL | Status: DC

## 2015-03-02 MED ORDER — IOHEXOL 350 MG/ML SOLN
100.0000 mL | Freq: Once | INTRAVENOUS | Status: AC | PRN
  Administered 2015-03-02: 100 mL via INTRAVENOUS

## 2015-03-02 MED ORDER — MORPHINE SULFATE 4 MG/ML IJ SOLN
4.0000 mg | Freq: Once | INTRAMUSCULAR | Status: AC
  Administered 2015-03-02: 4 mg via INTRAVENOUS
  Filled 2015-03-02: qty 1

## 2015-03-02 MED ORDER — MORPHINE SULFATE 4 MG/ML IJ SOLN
4.0000 mg | Freq: Once | INTRAMUSCULAR | Status: AC
Start: 2015-03-02 — End: 2015-03-02
  Administered 2015-03-02: 4 mg via INTRAVENOUS
  Filled 2015-03-02: qty 1

## 2015-03-02 MED ORDER — ASPIRIN 81 MG PO CHEW
324.0000 mg | CHEWABLE_TABLET | Freq: Once | ORAL | Status: AC
  Administered 2015-03-02: 324 mg via ORAL
  Filled 2015-03-02: qty 4

## 2015-03-02 NOTE — Discharge Instructions (Signed)
Take percocet for breakthrough pain, do not drink alcohol, drive, care for children or do other critical tasks while taking percocet.  Please follow with your primary care doctor in the next 2 days for a check-up. They must obtain records for further management.   Do not hesitate to return to the Emergency Department for any new, worsening or concerning symptoms.    Chest Wall Pain Chest wall pain is pain in or around the bones and muscles of your chest. It may take up to 6 weeks to get better. It may take longer if you must stay physically active in your work and activities.  CAUSES  Chest wall pain may happen on its own. However, it may be caused by:  A viral illness like the flu.  Injury.  Coughing.  Exercise.  Arthritis.  Fibromyalgia.  Shingles. HOME CARE INSTRUCTIONS   Avoid overtiring physical activity. Try not to strain or perform activities that cause pain. This includes any activities using your chest or your abdominal and side muscles, especially if heavy weights are used.  Put ice on the sore area.  Put ice in a plastic bag.  Place a towel between your skin and the bag.  Leave the ice on for 15-20 minutes per hour while awake for the first 2 days.  Only take over-the-counter or prescription medicines for pain, discomfort, or fever as directed by your caregiver. SEEK IMMEDIATE MEDICAL CARE IF:   Your pain increases, or you are very uncomfortable.  You have a fever.  Your chest pain becomes worse.  You have new, unexplained symptoms.  You have nausea or vomiting.  You feel sweaty or lightheaded.  You have a cough with phlegm (sputum), or you cough up blood. MAKE SURE YOU:   Understand these instructions.  Will watch your condition.  Will get help right away if you are not doing well or get worse. Document Released: 11/27/2005 Document Revised: 02/19/2012 Document Reviewed: 07/24/2011 [facility] Patient Information 2015 [place], [place]. This  information is not intended to replace advice given to you by your health care provider. Make sure you discuss any questions you have with your health care provider.

## 2015-03-02 NOTE — ED Notes (Addendum)
Pt reports central chest pain that started at 0900 this am. Pt has been having SOB all week due to allergies, but worse with chest pain. Also has dizziness and nausea. Pain radiates to neck with exertion. Hx of DVTs and PEs (last one 2015).

## 2015-03-02 NOTE — ED Provider Notes (Signed)
CSN: [id number]     Arrival date & time 03/02/15  1155 History   First MD Initiated Contact with Patient 03/02/15 1217     Chief Complaint  Patient presents with  . Chest Pain     (Consider location/radiation/quality/duration/timing/severity/associated sxs/prior Treatment) HPI   Makayla Frazier is a 44 y.o. female past medical history significant for multiple DVTs and PEs (most of these are silent, she is anticoagulated with Xarelto, has been compliant) complaining of 8 out of 10 pressure-like retrosternal chest pain radiating to the right jaw onset this morning has been constant for the day, new symptom of shortness of breath with exertion this afternoon associated with mild nausea. The pain is made worse by coughing and bending forward. Pain started at 9 AM this morning, it is been constant since then, waxing and waning. Patient reports bilateral lower tremor he swelling which is now resolved, she reports a left lower extremity pain in the calf and the inner thigh which persists in the thigh today. She has a cough which is newly productive, denies hemoptysis, syncope, fever, chills. On review of systems she notes a left lower quadrant mild abdominal pain with no emesis. She had diarrhea yesterday which is resolved. States she had a cold last week with rhinorrhea. States this is not similar to her prior pulmonary embolism as this for silent. Cardiac risk factors of insulin-dependent diabetes, hypertension, hyperlipidemia patient denies any tobacco use or family history of CAD.  Past Medical History  Diagnosis Date  . PE (pulmonary embolism)     2009 - on oral contraception; multiple  . Syncopal episodes     unknown etiology  . Dysfunctional uterine bleeding     Seen by Dr. Leo Grosser, GYN; pending hysterectomy as of 07/2012  . Asthma   . Obesity   . Mastodynia   . Hypertension   . Post - coital bleeding 2012  . Labial lesion 2013  . BV (bacterial vaginosis) 2012  . Oligomenorrhea 2011  .  DVT (deep venous thrombosis) 2011    pt had IUD; also 05/2012  . H/O hypercoagulable state     2/2 contraception  . HLD (hyperlipidemia)   . DM (diabetes mellitus)     diagnosed 2009  . Anemia   . Herpes simplex without mention of complication 01/4267    HSV-2  . Thyroid disease     hypothyroidism (pt denies)  . Vitamin D deficiency     unknown to pt.  . Major depression     Hx suicide attempt in 2009, [facility] admissions  . Venous insufficiency   . Sleep apnea   . Neuropathy   . Stroke     TIA 2013  . Headache(784.0)     migraines  . Neuromuscular disorder     neuropathy  . Arthritis     Left leg  . Status post placement of implantable loop recorder   . Complication of anesthesia     Seizures in PACU after surgery 3/15  . Seizures 2015    last episode May 2015   Past Surgical History  Procedure Laterality Date  . Breast reduction surgery      Dr. Eugene Garnet [facility] 2011  . Cholecystectomy    . Cardiac electrophysiology study & dft      Implantable Loop recorder; no arrhythmias associated with synocpal spells; loop explanted 07-2013  . Endometrial biopsy  2011  . Dilatation & currettage/hysteroscopy with resectocope  2007 & 2013  . Laparoscopic assisted vaginal hysterectomy  12/18/2012  Procedure: LAPAROSCOPIC ASSISTED VAGINAL HYSTERECTOMY;  Surgeon: Eldred Manges, MD;  Location: [facility];  Service: Gynecology;  Laterality: N/A;  . Iud removal  12/18/2012    Procedure: INTRAUTERINE DEVICE (IUD) REMOVAL;  Surgeon: Eldred Manges, MD;  Location: [facility];  Service: Gynecology;  Laterality: N/A;  Removed during prep by E. Florene Glen PA  . Bilateral salpingectomy  12/18/2012    Procedure: BILATERAL SALPINGECTOMY;  Surgeon: Eldred Manges, MD;  Location: [facility];  Service: Gynecology;  Laterality: Bilateral;  . Abdominal hysterectomy    . Bilateral oophorectomy    . Bladder suspension N/A 02/13/2014    Procedure: TRANSVAGINAL TAPE (TVT) PROCEDURE;  Surgeon: Delice Lesch, MD;   Location: [facility];  Service: Gynecology;  Laterality: N/A;  . Wisdom tooth extraction    . Transvaginal tape (tvt) removal N/A 04/28/2014    Procedure: Removal of suburethral mesh;  Surgeon: Delice Lesch, MD;  Location: [facility];  Service: Gynecology;  Laterality: N/A;  . Loop recorder explant N/A 07/17/2013    Procedure: LOOP RECORDER EXPLANT;  Surgeon: Thompson Grayer, MD;  Location: [facility] CATH LAB;  Service: Cardiovascular;  Laterality: N/A;   Family History  Problem Relation Age of Onset  . Melanoma Father 78  . Ulcerative colitis Mother 19  . Breast cancer Paternal Grandmother   . Diabetes type II Maternal Grandmother     deceased 48  . Pulmonary embolism Paternal Grandfather    History  Substance Use Topics  . Smoking status: Never Smoker   . Smokeless tobacco: Never Used  . Alcohol Use: No   OB History    Gravida Para Term Preterm AB TAB SAB Ectopic Multiple Living   0 0             Review of Systems  10 systems reviewed and found to be negative, except as noted in the HPI.   Allergies  Codeine; Darvocet; Hydrocodone-acetaminophen; Percocet; and Latex  Home Medications   Prior to Admission medications   Medication Sig Start Date End Date Taking? Authorizing Provider  albuterol (PROVENTIL HFA;VENTOLIN HFA) 108 (90 BASE) MCG/ACT inhaler Inhale 2 puffs into the lungs every 6 (six) hours as needed for wheezing or shortness of breath.   Yes Historical Provider, MD  amLODipine (NORVASC) 10 MG tablet Take 10 mg by mouth at bedtime.    Yes Historical Provider, MD  atorvastatin (LIPITOR) 10 MG tablet Take 1 tablet by mouth daily. 01/08/14  Yes Historical Provider, MD  budesonide-formoterol (SYMBICORT) 80-4.5 MCG/ACT inhaler Inhale 2 puffs into the lungs daily.   Yes Historical Provider, MD  Insulin Degludec 200 UNIT/ML SOPN Inject 50 Units into the skin at bedtime.   Yes Historical Provider, MD  INVOKAMET 150-500 MG TABS Take 1 tablet by mouth 2 (two) times daily.  09/25/14  Yes  Historical Provider, MD  levETIRAcetam (KEPPRA) 500 MG tablet Take 500 mg by mouth 2 (two) times daily. 01/14/15 01/14/16 Yes Historical Provider, MD  lisinopril (PRINIVIL,ZESTRIL) 20 MG tablet Take 20 mg by mouth daily.   Yes Historical Provider, MD  loratadine (CLARITIN) 10 MG tablet Take 10 mg by mouth daily as needed. allergies 08/04/13  Yes Historical Provider, MD  NOVOLOG FLEXPEN 100 UNIT/ML FlexPen Inject 9 Units into the skin 3 (three) times daily before meals. Takes if suger > 200 01/08/14  Yes Historical Provider, MD  Rivaroxaban (XARELTO) 20 MG TABS Take 20 mg by mouth at bedtime.    Yes Historical Provider, MD  rizatriptan (MAXALT-MLT) 5 MG  disintegrating tablet Take 1 tablet (5 mg total) by mouth as needed for migraine. May repeat in 2 hours if needed 05/07/14  Yes Marcial Pacas, MD  topiramate (TOPAMAX) 100 MG tablet Take 100 mg by mouth 2 (two) times daily.   Yes Historical Provider, MD  ondansetron (ZOFRAN) 4 MG tablet Take 1 tablet (4 mg total) by mouth every 6 (six) hours. 03/02/15   Galvin Aversa, PA-C  oxyCODONE-acetaminophen (PERCOCET/ROXICET) 5-325 MG per tablet 1 to 2 tabs PO q6hrs  PRN for pain 03/02/15   Elmyra Ricks Doreather Hoxworth, PA-C   BP 130/75 mmHg  Pulse 90  Temp(Src) 98.1 F (36.7 C) (Oral)  Resp 18  SpO2 98%  LMP 12/19/2011 Physical Exam  Constitutional: She is oriented to person, place, and time. She appears well-developed and well-nourished. No distress.  HENT:  Head: Normocephalic.  Eyes: Conjunctivae and EOM are normal. Pupils are equal, round, and reactive to light.  Cardiovascular: Normal rate, regular rhythm and intact distal pulses.   Pulmonary/Chest: Effort normal and breath sounds normal. No stridor. No respiratory distress. She has no wheezes. She has no rales. She exhibits no tenderness.    Abdominal: Soft. Bowel sounds are normal. She exhibits no distension and no mass. There is tenderness. There is no rebound and no guarding.  Central adiposity, mild left lower  quadrant tenderness palpation with no guarding or rebound.  Musculoskeletal: Normal range of motion. She exhibits no edema or tenderness.  No calf asymmetry, superficial collaterals, palpable cords, edema, Homans sign negative bilaterally.    Neurological: She is alert and oriented to person, place, and time.  Psychiatric: She has a normal mood and affect.  Nursing note and vitals reviewed.   ED Course  Procedures (including critical care time) Labs Review Labs Reviewed  BASIC METABOLIC PANEL - Abnormal; Notable for the following:    Potassium 5.2 (*)    Glucose, Bld 228 (*)    GFR calc non Af Amer 88 (*)    All other components within normal limits  CBC - Abnormal; Notable for the following:    WBC 12.5 (*)    RBC 5.46 (*)    MCH 25.8 (*)    All other components within normal limits  BRAIN NATRIURETIC PEPTIDE  I-STAT TROPOININ, ED    Imaging Review Dg Chest 2 View (if Patient Has Fever And/or Copd)  03/02/2015   CLINICAL DATA:  44 year old female with chest pain in the center of the chest since this morning. Initial encounter.  EXAM: CHEST  2 VIEW  COMPARISON:  Chest CTA 04/30/2014 and earlier.  FINDINGS: Mildly lower lung volumes. Normal cardiac size and mediastinal contours. Visualized tracheal air column is within normal limits. Lung parenchyma stable and clear. No pneumothorax or pleural effusion. Stable cholecystectomy clips. No acute osseous abnormality identified.  IMPRESSION: No acute cardiopulmonary abnormality.   Electronically Signed   By: Genevie Ann M.D.   On: 03/02/2015 13:30   Ct Angio Chest Pe W/cm &/or Wo Cm  03/02/2015   CLINICAL DATA:  Chest pain, cough, possible pulmonary embolus  EXAM: CT ANGIOGRAPHY CHEST WITH CONTRAST  TECHNIQUE: Multidetector CT imaging of the chest was performed using the standard protocol during bolus administration of intravenous contrast. Multiplanar CT image reconstructions and MIPs were obtained to evaluate the vascular anatomy.  CONTRAST:   172mL OMNIPAQUE IOHEXOL 350 MG/ML SOLN  COMPARISON:  04/30/2014  FINDINGS: Sagittal images of the spine shows mild degenerative changes thoracic spine. There are streaky artifacts from patient's large body habitus. Heart  size is stable. No pericardial effusion. The visualized upper abdomen shows fatty infiltration of the liver. No adrenal gland mass is noted.  Images of the thoracic inlet are unremarkable. Central airways are patent. No mediastinal hematoma or adenopathy.  No pulmonary embolus is noted.  Images of the lung parenchyma shows no acute infiltrate or pulmonary edema. No pulmonary nodules are noted. No destructive rib lesions are noted.  Review of the MIP images confirms the above findings.  IMPRESSION: 1. No pulmonary embolus. 2. No acute infiltrate or pulmonary edema. 3. There is fatty infiltration of the liver. 4. No mediastinal hematoma or adenopathy.   Electronically Signed   By: Lahoma Crocker M.D.   On: 03/02/2015 14:27     EKG Interpretation   Date/Time:  Tuesday March 02 2015 12:10:00 EDT Ventricular Rate:  104 PR Interval:  136 QRS Duration: 64 QT Interval:  441 QTC Calculation: 580 R Axis:   50 Text Interpretation:  Sinus tachycardia Low voltage, precordial leads  Borderline abnrm T, anterolateral leads Prolonged QT interval Baseline  wander in lead(s) II III aVR aVL aVF Confirmed by Ashok Cordia  MD, Lennette Bihari  ([id number]) on 03/02/2015 1:53:57 PM      MDM   Final diagnoses:  Chest wall pain    Filed Vitals:   03/02/15 1325 03/02/15 1331 03/02/15 1338 03/02/15 1511  BP: 122/75 137/83 139/91 130/75  Pulse: 90 100 103 90  Temp:      TempSrc:      Resp:  14 25 18   SpO2: 98% 97% 98% 98%    Medications  aspirin chewable tablet 324 mg (324 mg Oral Given 03/02/15 1300)  nitroGLYCERIN (NITROSTAT) SL tablet 0.4 mg (0.4 mg Sublingual Given 03/02/15 1338)  morphine 4 MG/ML injection 4 mg (4 mg Intravenous Given 03/02/15 1344)  ondansetron (ZOFRAN) injection 4 mg (4 mg Intravenous Given  03/02/15 1343)  iohexol (OMNIPAQUE) 350 MG/ML injection 100 mL (100 mLs Intravenous Contrast Given 03/02/15 1357)  morphine 4 MG/ML injection 4 mg (4 mg Intravenous Given 03/02/15 1438)    Makayla Frazier is a pleasant 44 y.o. female presenting with retrosternal chest pain radiating to the right jaw she has shortness of breath with exertion and bending forward. Patient is afebrile, mildly tachycardic. Chest wall is tender to palpation. EKG low voltage, prolonged QTC. Troponin is negative. She has been having chest pain since 9 AM, this was a ACS would be elevated by this time. CTA with no signs of pulmonary embolism. Discussed case with patient and she is amenable to discharge. I've encouraged her to call follow closely with primary care to discuss possible cardiac evaluation and stress testing based on her risk factors.  This is a shared visit with the attending physician who personally evaluated the patient and agrees with the care plan.   Evaluation does not show pathology that would require ongoing emergent intervention or inpatient treatment. Pt is hemodynamically stable and mentating appropriately. Discussed findings and plan with patient/guardian, who agrees with care plan. All questions answered. Return precautions discussed and outpatient follow up given.   Discharge Medication List as of 03/02/2015  2:55 PM    START taking these medications   Details  ondansetron (ZOFRAN) 4 MG tablet Take 1 tablet (4 mg total) by mouth every 6 (six) hours., Starting 03/02/2015, Until Discontinued, Print    oxyCODONE-acetaminophen (PERCOCET/ROXICET) 5-325 MG per tablet 1 to 2 tabs PO q6hrs  PRN for pain, Print  Monico Blitz, PA-C 03/02/15 [place], MD 03/02/15 [phone]

## 2015-05-12 ENCOUNTER — Emergency Department (HOSPITAL_BASED_OUTPATIENT_CLINIC_OR_DEPARTMENT_OTHER)
Admit: 2015-05-12 | Discharge: 2015-05-12 | Disposition: A | Discharge status: Home / Self Care | Payer: Medicare Other | Attending: Emergency Medicine | Admitting: Emergency Medicine

## 2015-05-12 ENCOUNTER — Encounter (HOSPITAL_COMMUNITY): Payer: Self-pay | Admitting: Emergency Medicine

## 2015-05-12 ENCOUNTER — Observation Stay (HOSPITAL_COMMUNITY)
Admission: EM | Admit: 2015-05-12 | Discharge: 2015-05-13 | Disposition: A | Discharge status: Home / Self Care | Payer: Medicare Other | Attending: Internal Medicine | Admitting: Internal Medicine

## 2015-05-12 ENCOUNTER — Emergency Department (HOSPITAL_COMMUNITY): Payer: Medicare Other

## 2015-05-12 DIAGNOSIS — Z90710 Acquired absence of both cervix and uterus: Secondary | ICD-10-CM | POA: Insufficient documentation

## 2015-05-12 DIAGNOSIS — Z91040 Latex allergy status: Secondary | ICD-10-CM | POA: Insufficient documentation

## 2015-05-12 DIAGNOSIS — D72829 Elevated white blood cell count, unspecified: Secondary | ICD-10-CM | POA: Insufficient documentation

## 2015-05-12 DIAGNOSIS — R079 Chest pain, unspecified: Secondary | ICD-10-CM | POA: Diagnosis not present

## 2015-05-12 DIAGNOSIS — Z7901 Long term (current) use of anticoagulants: Secondary | ICD-10-CM | POA: Insufficient documentation

## 2015-05-12 DIAGNOSIS — Z886 Allergy status to analgesic agent status: Secondary | ICD-10-CM | POA: Diagnosis not present

## 2015-05-12 DIAGNOSIS — Z8673 Personal history of transient ischemic attack (TIA), and cerebral infarction without residual deficits: Secondary | ICD-10-CM | POA: Diagnosis not present

## 2015-05-12 DIAGNOSIS — I10 Essential (primary) hypertension: Secondary | ICD-10-CM | POA: Diagnosis present

## 2015-05-12 DIAGNOSIS — Z86718 Personal history of other venous thrombosis and embolism: Secondary | ICD-10-CM | POA: Insufficient documentation

## 2015-05-12 DIAGNOSIS — Z6833 Body mass index (BMI) 33.0-33.9, adult: Secondary | ICD-10-CM | POA: Diagnosis not present

## 2015-05-12 DIAGNOSIS — F329 Major depressive disorder, single episode, unspecified: Secondary | ICD-10-CM | POA: Insufficient documentation

## 2015-05-12 DIAGNOSIS — IMO0001 Reserved for inherently not codable concepts without codable children: Secondary | ICD-10-CM

## 2015-05-12 DIAGNOSIS — R0789 Other chest pain: Principal | ICD-10-CM | POA: Insufficient documentation

## 2015-05-12 DIAGNOSIS — Z888 Allergy status to other drugs, medicaments and biological substances status: Secondary | ICD-10-CM | POA: Diagnosis not present

## 2015-05-12 DIAGNOSIS — E119 Type 2 diabetes mellitus without complications: Secondary | ICD-10-CM | POA: Diagnosis not present

## 2015-05-12 DIAGNOSIS — M79609 Pain in unspecified limb: Secondary | ICD-10-CM | POA: Diagnosis not present

## 2015-05-12 DIAGNOSIS — E1165 Type 2 diabetes mellitus with hyperglycemia: Secondary | ICD-10-CM | POA: Insufficient documentation

## 2015-05-12 DIAGNOSIS — J45909 Unspecified asthma, uncomplicated: Secondary | ICD-10-CM | POA: Diagnosis not present

## 2015-05-12 DIAGNOSIS — Z794 Long term (current) use of insulin: Secondary | ICD-10-CM | POA: Insufficient documentation

## 2015-05-12 DIAGNOSIS — E785 Hyperlipidemia, unspecified: Secondary | ICD-10-CM | POA: Diagnosis not present

## 2015-05-12 DIAGNOSIS — Z86711 Personal history of pulmonary embolism: Secondary | ICD-10-CM | POA: Diagnosis not present

## 2015-05-12 DIAGNOSIS — E669 Obesity, unspecified: Secondary | ICD-10-CM | POA: Diagnosis not present

## 2015-05-12 DIAGNOSIS — M7989 Other specified soft tissue disorders: Secondary | ICD-10-CM

## 2015-05-12 DIAGNOSIS — I2699 Other pulmonary embolism without acute cor pulmonale: Secondary | ICD-10-CM

## 2015-05-12 DIAGNOSIS — E118 Type 2 diabetes mellitus with unspecified complications: Secondary | ICD-10-CM

## 2015-05-12 LAB — CBC
HCT: 43.4 % (ref 36.0–46.0)
Hemoglobin: 14.1 g/dL (ref 12.0–15.0)
MCH: 25.4 pg — ABNORMAL LOW (ref 26.0–34.0)
MCHC: 32.5 g/dL (ref 30.0–36.0)
MCV: 78.1 fL (ref 78.0–100.0)
Platelets: 323 10*3/uL (ref 150–400)
RBC: 5.56 MIL/uL — ABNORMAL HIGH (ref 3.87–5.11)
RDW: 15.2 % (ref 11.5–15.5)
WBC: 12.1 10*3/uL — ABNORMAL HIGH (ref 4.0–10.5)

## 2015-05-12 LAB — I-STAT TROPONIN, ED
TROPONIN I, POC: 0.01 ng/mL (ref 0.00–0.08)
Troponin i, poc: 0 ng/mL (ref 0.00–0.08)

## 2015-05-12 LAB — BASIC METABOLIC PANEL
ANION GAP: 13 (ref 5–15)
BUN: 7 mg/dL (ref 6–20)
CALCIUM: 9.3 mg/dL (ref 8.9–10.3)
CO2: 23 mmol/L (ref 22–32)
CREATININE: 0.64 mg/dL (ref 0.44–1.00)
Chloride: 99 mmol/L — ABNORMAL LOW (ref 101–111)
GFR calc Af Amer: >60 mL/min (ref >60)
GFR calc non Af Amer: >60 mL/min (ref >60)
Glucose, Bld: 197 mg/dL — ABNORMAL HIGH (ref 65–99)
Potassium: 3.5 mmol/L (ref 3.5–5.1)
SODIUM: 135 mmol/L (ref 135–145)

## 2015-05-12 LAB — GLUCOSE, CAPILLARY: Glucose-Capillary: 220 mg/dL — ABNORMAL HIGH (ref 65–99)

## 2015-05-12 LAB — TROPONIN I

## 2015-05-12 LAB — BRAIN NATRIURETIC PEPTIDE: B Natriuretic Peptide: 19.8 pg/mL (ref 0.0–100.0)

## 2015-05-12 MED ORDER — TOPIRAMATE 100 MG PO TABS
100.0000 mg | ORAL_TABLET | Freq: Two times a day (BID) | ORAL | Status: DC
  Administered 2015-05-12: 100 mg via ORAL
  Filled 2015-05-12 (×3): qty 1

## 2015-05-12 MED ORDER — SODIUM CHLORIDE 0.9 % IV BOLUS (SEPSIS)
1000.0000 mL | Freq: Once | INTRAVENOUS | Status: AC
  Administered 2015-05-12: 1000 mL via INTRAVENOUS

## 2015-05-12 MED ORDER — IOHEXOL 350 MG/ML SOLN
100.0000 mL | Freq: Once | INTRAVENOUS | Status: AC | PRN
  Administered 2015-05-12: 70 mL via INTRAVENOUS

## 2015-05-12 MED ORDER — HYDROCHLOROTHIAZIDE 12.5 MG PO CAPS
12.5000 mg | ORAL_CAPSULE | Freq: Every day | ORAL | Status: DC
  Administered 2015-05-12: 12.5 mg via ORAL
  Filled 2015-05-12 (×2): qty 1

## 2015-05-12 MED ORDER — MORPHINE SULFATE 2 MG/ML IJ SOLN
2.0000 mg | INTRAMUSCULAR | Status: DC | PRN
  Administered 2015-05-12: 2 mg via INTRAVENOUS
  Filled 2015-05-12: qty 1

## 2015-05-12 MED ORDER — SODIUM CHLORIDE 0.9 % IV SOLN
INTRAVENOUS | Status: AC
  Administered 2015-05-12: 23:00:00 via INTRAVENOUS

## 2015-05-12 MED ORDER — INSULIN DEGLUDEC 200 UNIT/ML ~~LOC~~ SOPN: 50.0000 [IU] | PEN_INJECTOR | Freq: Every day | SUBCUTANEOUS | Status: DC

## 2015-05-12 MED ORDER — ALBUTEROL SULFATE (2.5 MG/3ML) 0.083% IN NEBU: 3.0000 mL | INHALATION_SOLUTION | Freq: Four times a day (QID) | RESPIRATORY_TRACT | Status: DC | PRN

## 2015-05-12 MED ORDER — LORATADINE 10 MG PO TABS
10.0000 mg | ORAL_TABLET | Freq: Every day | ORAL | Status: DC | PRN
Start: 2015-05-12 — End: 2015-05-13

## 2015-05-12 MED ORDER — RIVAROXABAN 20 MG PO TABS
20.0000 mg | ORAL_TABLET | Freq: Every day | ORAL | Status: DC
  Administered 2015-05-12: 20 mg via ORAL
  Filled 2015-05-12: qty 1

## 2015-05-12 MED ORDER — ONDANSETRON HCL 4 MG/2ML IJ SOLN: 4.0000 mg | Freq: Four times a day (QID) | INTRAMUSCULAR | Status: DC | PRN

## 2015-05-12 MED ORDER — AMLODIPINE BESYLATE 10 MG PO TABS
10.0000 mg | ORAL_TABLET | Freq: Every day | ORAL | Status: DC
  Administered 2015-05-12: 10 mg via ORAL
  Filled 2015-05-12: qty 1

## 2015-05-12 MED ORDER — INSULIN ASPART 100 UNIT/ML ~~LOC~~ SOLN
0.0000 [IU] | Freq: Every day | SUBCUTANEOUS | Status: DC
  Administered 2015-05-12: 2 [IU] via SUBCUTANEOUS

## 2015-05-12 MED ORDER — ACETAMINOPHEN 325 MG PO TABS
650.0000 mg | ORAL_TABLET | ORAL | Status: DC | PRN
  Administered 2015-05-13: 650 mg via ORAL
  Filled 2015-05-12: qty 2

## 2015-05-12 MED ORDER — LISINOPRIL-HYDROCHLOROTHIAZIDE 10-12.5 MG PO TABS: 1.0000 | ORAL_TABLET | Freq: Every day | ORAL | Status: DC

## 2015-05-12 MED ORDER — LEVETIRACETAM 500 MG PO TABS
500.0000 mg | ORAL_TABLET | Freq: Two times a day (BID) | ORAL | Status: DC
  Administered 2015-05-12 – 2015-05-13 (×2): 500 mg via ORAL
  Filled 2015-05-12 (×2): qty 1

## 2015-05-12 MED ORDER — ATORVASTATIN CALCIUM 10 MG PO TABS
10.0000 mg | ORAL_TABLET | Freq: Every day | ORAL | Status: DC
  Administered 2015-05-12 – 2015-05-13 (×2): 10 mg via ORAL
  Filled 2015-05-12 (×2): qty 1

## 2015-05-12 MED ORDER — INSULIN GLARGINE 100 UNIT/ML ~~LOC~~ SOLN
50.0000 [IU] | Freq: Every day | SUBCUTANEOUS | Status: DC
  Administered 2015-05-12: 50 [IU] via SUBCUTANEOUS
  Filled 2015-05-12 (×2): qty 0.5

## 2015-05-12 MED ORDER — BUDESONIDE-FORMOTEROL FUMARATE 80-4.5 MCG/ACT IN AERO
2.0000 | INHALATION_SPRAY | Freq: Every day | RESPIRATORY_TRACT | Status: DC
  Filled 2015-05-12: qty 6.9

## 2015-05-12 MED ORDER — INSULIN ASPART 100 UNIT/ML ~~LOC~~ SOLN
0.0000 [IU] | Freq: Three times a day (TID) | SUBCUTANEOUS | Status: DC
  Administered 2015-05-13: 3 [IU] via SUBCUTANEOUS

## 2015-05-12 MED ORDER — LISINOPRIL 10 MG PO TABS
10.0000 mg | ORAL_TABLET | Freq: Every day | ORAL | Status: DC
  Administered 2015-05-12: 10 mg via ORAL
  Filled 2015-05-12 (×3): qty 1

## 2015-05-12 MED ORDER — MONTELUKAST SODIUM 10 MG PO TABS
10.0000 mg | ORAL_TABLET | Freq: Every day | ORAL | Status: DC
  Administered 2015-05-12: 10 mg via ORAL
  Filled 2015-05-12: qty 1

## 2015-05-12 NOTE — ED Notes (Signed)
Ultrasound at bedside

## 2015-05-12 NOTE — ED Notes (Signed)
Pt c/o left sided chest pain that started around 11am today whiel she was laying down in bed.  Pt states that she had little SHOB and dizziness with pain.  Pt states that she has had knot on left arm today and worried bc she has PMH of blood clots.  Pt states that knot is painful.

## 2015-05-12 NOTE — Progress Notes (Signed)
Left upper extremity venous duplex completed.  Left:  No evidence of DVT or superficial thrombosis.  Right:  Negative for DVT in the subclavian vein.

## 2015-05-12 NOTE — ED Notes (Signed)
4W notified of delay in pt transport to the floor d/t elevated BP.

## 2015-05-12 NOTE — ED Provider Notes (Signed)
CSN: [id number]     Arrival date & time 05/12/15  1313 History   First MD Initiated Contact with Patient 05/12/15 1454     Chief Complaint  Patient presents with  . Chest Pain     (Consider location/radiation/quality/duration/timing/severity/associated sxs/prior Treatment) The history is provided by the patient. No language interpreter was used.   [name] is a 44 y.o female with a history of PE & DVT (mostly silent, currently anticoagulated with xarelto), asthma, hypertension, diabetes, anemia, sleep apnea, stroke, seizure presents with chest pain that began this morning when she woke up between 10 and 11 AM. She states the pain radiates down her left arm and worse with deep breaths and with exertion. She states the left arm began to swell along the lateral distal forearm but has since gone down. She states it is still tender to the touch. She reports that a few days ago she had left leg swelling along the medial left thigh but this has since resolved.  She had a stress test last week that was normal. Denies a history of smoking. No history of MI or stents. She does not exercise regularly. She denies estrogen use. She denies any family cardiac history before the age of 79.  She denies any fever, chills, cough, hemoptysis, abdominal pain, nausea, vomiting, diarrhea, constipation, dysuria, hematuria, increased leg swelling. Past Medical History  Diagnosis Date  . PE (pulmonary embolism)     2009 - on oral contraception; multiple  . Syncopal episodes     unknown etiology  . Dysfunctional uterine bleeding     Seen by Dr. Leo Grosser, GYN; pending hysterectomy as of 07/2012  . Asthma   . Obesity   . Mastodynia   . Hypertension   . Post - coital bleeding 2012  . Labial lesion 2013  . BV (bacterial vaginosis) 2012  . Oligomenorrhea 2011  . DVT (deep venous thrombosis) 2011    pt had IUD; also 05/2012  . H/O hypercoagulable state     2/2 contraception  . HLD (hyperlipidemia)   . DM (diabetes  mellitus)     diagnosed 2009  . Anemia   . Herpes simplex without mention of complication 08/4853    HSV-2  . Thyroid disease     hypothyroidism (pt denies)  . Vitamin D deficiency     unknown to pt.  . Major depression     Hx suicide attempt in 2009, [facility] admissions  . Venous insufficiency   . Sleep apnea   . Neuropathy   . Stroke     TIA 2013  . Headache(784.0)     migraines  . Neuromuscular disorder     neuropathy  . Arthritis     Left leg  . Status post placement of implantable loop recorder   . Complication of anesthesia     Seizures in PACU after surgery 3/15  . Seizures 2015    last episode May 2015   Past Surgical History  Procedure Laterality Date  . Breast reduction surgery      Dr. Eugene Garnet [facility] 2011  . Cholecystectomy    . Cardiac electrophysiology study & dft      Implantable Loop recorder; no arrhythmias associated with synocpal spells; loop explanted 07-2013  . Endometrial biopsy  2011  . Dilatation & currettage/hysteroscopy with resectocope  2007 & 2013  . Laparoscopic assisted vaginal hysterectomy  12/18/2012    Procedure: LAPAROSCOPIC ASSISTED VAGINAL HYSTERECTOMY;  Surgeon: Eldred Manges, MD;  Location: [facility];  Service:  Gynecology;  Laterality: N/A;  . Iud removal  12/18/2012    Procedure: INTRAUTERINE DEVICE (IUD) REMOVAL;  Surgeon: Eldred Manges, MD;  Location: [facility];  Service: Gynecology;  Laterality: N/A;  Removed during prep by E. Florene Glen PA  . Bilateral salpingectomy  12/18/2012    Procedure: BILATERAL SALPINGECTOMY;  Surgeon: Eldred Manges, MD;  Location: [facility];  Service: Gynecology;  Laterality: Bilateral;  . Abdominal hysterectomy    . Bilateral oophorectomy    . Bladder suspension N/A 02/13/2014    Procedure: TRANSVAGINAL TAPE (TVT) PROCEDURE;  Surgeon: Delice Lesch, MD;  Location: [facility];  Service: Gynecology;  Laterality: N/A;  . Wisdom tooth extraction    . Transvaginal tape (tvt) removal N/A 04/28/2014    Procedure: Removal of  suburethral mesh;  Surgeon: Delice Lesch, MD;  Location: [facility];  Service: Gynecology;  Laterality: N/A;  . Loop recorder explant N/A 07/17/2013    Procedure: LOOP RECORDER EXPLANT;  Surgeon: Thompson Grayer, MD;  Location: [facility] C[facility];  Service: Cardiovascular;  Laterality: N/A;   Family History  Problem Relation Age of Onset  . Melanoma Father 19  . Ulcerative colitis Mother 50  . Breast cancer Paternal Grandmother   . Diabetes type II Maternal Grandmother     deceased 24  . Pulmonary embolism Paternal Grandfather    History  Substance Use Topics  . Smoking status: Never Smoker   . Smokeless tobacco: Never Used  . Alcohol Use: No   OB History    Gravida Para Term Preterm AB TAB SAB Ectopic Multiple Living   0 0             Review of Systems  Respiratory: Negative for cough.   Cardiovascular: Negative for palpitations and leg swelling.  Musculoskeletal: Negative for neck pain.  Skin: Negative for color change.  Neurological: Negative for dizziness, syncope, weakness, numbness and headaches.  All other systems reviewed and are negative.     Allergies  Codeine; Darvocet; Hydrocodone-acetaminophen; Latex; and Tramadol  Home Medications   Prior to Admission medications   Medication Sig Start Date End Date Taking? Authorizing Provider  albuterol (PROVENTIL HFA;VENTOLIN HFA) 108 (90 BASE) MCG/ACT inhaler Inhale 2 puffs into the lungs every 6 (six) hours as needed for wheezing or shortness of breath.   Yes Historical Provider, MD  amLODipine (NORVASC) 10 MG tablet Take 10 mg by mouth at bedtime.    Yes Historical Provider, MD  atorvastatin (LIPITOR) 10 MG tablet Take 1 tablet by mouth daily. 01/08/14  Yes Historical Provider, MD  budesonide-formoterol (SYMBICORT) 80-4.5 MCG/ACT inhaler Inhale 2 puffs into the lungs daily.   Yes Historical Provider, MD  Insulin Degludec 200 UNIT/ML SOPN Inject 50 Units into the skin at bedtime.   Yes Historical Provider, MD  INVOKAMET 150-500 MG  TABS Take 1 tablet by mouth 2 (two) times daily.  09/25/14  Yes Historical Provider, MD  levETIRAcetam (KEPPRA) 500 MG tablet Take 500 mg by mouth 2 (two) times daily. 01/14/15 01/14/16 Yes Historical Provider, MD  lisinopril-hydrochlorothiazide (PRINZIDE,ZESTORETIC) 10-12.5 MG per tablet Take 1 tablet by mouth daily. 04/08/15  Yes Historical Provider, MD  loratadine (CLARITIN) 10 MG tablet Take 10 mg by mouth daily as needed for allergies.  08/04/13  Yes Historical Provider, MD  montelukast (SINGULAIR) 10 MG tablet Take 1 tablet by mouth at bedtime. 04/13/15  Yes Historical Provider, MD  NOVOLOG FLEXPEN 100 UNIT/ML FlexPen Inject 9 Units into the skin 3 (three) times daily before meals.  Takes if suger > 200 01/08/14  Yes Historical Provider, MD  ondansetron (ZOFRAN) 4 MG tablet Take 1 tablet (4 mg total) by mouth every 6 (six) hours. 03/02/15  Yes Nicole Pisciotta, PA-C  Rivaroxaban (XARELTO) 20 MG TABS Take 20 mg by mouth at bedtime.    Yes Historical Provider, MD  rizatriptan (MAXALT-MLT) 5 MG disintegrating tablet Take 1 tablet (5 mg total) by mouth as needed for migraine. May repeat in 2 hours if needed 05/07/14  Yes Marcial Pacas, MD  topiramate (TOPAMAX) 100 MG tablet Take 100 mg by mouth 2 (two) times daily.   Yes Historical Provider, MD  oxyCODONE-acetaminophen (PERCOCET/ROXICET) 5-325 MG per tablet 1 to 2 tabs PO q6hrs  PRN for pain Patient not taking: Reported on 05/12/2015 03/02/15   Elmyra Ricks Pisciotta, PA-C   BP 174/97 mmHg  Pulse 79  Temp(Src) 98.7 F (37.1 C) (Oral)  Resp 16  Ht 5' (1.524 m)  Wt 173 lb (78.472 kg)  BMI 33.79 kg/m2  SpO2 98%  LMP 12/19/2011 Physical Exam  Constitutional: She is oriented to person, place, and time. She appears well-developed and well-nourished.  HENT:  Head: Normocephalic and atraumatic.  Eyes: Conjunctivae are normal.  Neck: Normal range of motion. Neck supple.  Cardiovascular: Normal rate, regular rhythm and normal heart sounds.   Pulmonary/Chest: Effort  normal and breath sounds normal. No respiratory distress. She has no wheezes. She has no rales.  Abdominal: Soft. There is no tenderness.  Musculoskeletal: Normal range of motion.  Left distal lateral forearm edema and TTP.  Good capillary refill.  Good radial pulse.  No pallor.   No lower extremity edema or TTP in the calf or popliteal fossa. Good DP pulses bilaterally.   Neurological: She is alert and oriented to person, place, and time. She has normal strength. No sensory deficit. GCS eye subscore is 4. GCS verbal subscore is 5. GCS motor subscore is 6.  Good upper extremity strength bilaterally. Cranial nerves III through XII intact.  Skin: Skin is warm and dry.  Nursing note and vitals reviewed.   ED Course  Procedures (including critical care time) Labs Review Labs Reviewed  CBC - Abnormal; Notable for the following:    WBC 12.1 (*)    RBC 5.56 (*)    MCH 25.4 (*)    All other components within normal limits  BASIC METABOLIC PANEL - Abnormal; Notable for the following:    Chloride 99 (*)    Glucose, Bld 197 (*)    All other components within normal limits  BRAIN NATRIURETIC PEPTIDE  I-STAT TROPOININ, ED  I-STAT TROPOININ, ED    Imaging Review Dg Chest 2 View  05/12/2015   CLINICAL DATA:  One day history of chest pain radiating into left upper extremity  EXAM: CHEST  2 VIEW  COMPARISON:  Chest radiograph March 02, 2015 and chest CT March 02, 2015  FINDINGS: There is no edema or consolidation. Heart size and pulmonary vascularity are normal. No adenopathy. No pneumothorax. No bone lesions. There are surgical clips in the right upper quadrant.  IMPRESSION: No edema or consolidation.   Electronically Signed   By: Lowella Grip III M.D.   On: 05/12/2015 14:24   Ct Angio Chest Pe W/cm &/or Wo Cm  05/12/2015   CLINICAL DATA:  Left chest pain, shortness of breath x5 hours  EXAM: CT ANGIOGRAPHY CHEST WITH CONTRAST  TECHNIQUE: Multidetector CT imaging of the chest was performed using  the standard protocol during bolus administration of intravenous  contrast. Multiplanar CT image reconstructions and MIPs were obtained to evaluate the vascular anatomy.  CONTRAST:  6mL OMNIPAQUE IOHEXOL 350 MG/ML SOLN  COMPARISON:  03/02/2015  FINDINGS: Right arm contrast injection. The SVC is patent. Right ventricle is nondilated. Satisfactory opacification of pulmonary arteries noted, and there is no evidence of acute pulmonary emboli. There are nonocclusive intraluminal webs at the bifurcation of the right lower lobe branch of the pulmonary artery. There is some chronic eccentric wall thickening and intraluminal webs at the branching of the left lower lobe pulmonary artery. Patent pulmonary veins bilaterally. Adequate contrast opacification of the thoracic aorta with no evidence of dissection, aneurysm, or stenosis. There is classic 3-vessel brachiocephalic arch anatomy without proximal stenosis. No significant atheromatous plaque. No pleural or pericardial effusion. No hilar or mediastinal adenopathy. Scattered Small blebs bilaterally. Dependent atelectasis posteriorly in the lower lobes , right worse than left. Thoracic spine and sternum intact. Fatty liver. Remainder visualized upper abdomen unremarkable.  Review of the MIP images confirms the above findings.  IMPRESSION: 1. Negative for acute PE or thoracic aortic dissection. 2. Stable nonocclusive changes of chronic PE in bilateral lower lobe pulmonary artery branches.   Electronically Signed   By: Lucrezia Europe M.D.   On: 05/12/2015 16:29   EKG interpretation 05/12/15 13:21 Vent. rate 81 BPM PR interval 148 ms QRS duration 69 ms QT/QTc 364/422 ms P-R-T axes 52 25 17 Sinus rhythm Low voltage, precordial leads Borderline T abnormalities, anterior leads Baseline wander in lead(s) II III aVF I interpreted this EKG, Ottie Glazier, PA-C.   MDM   Final diagnoses:  Chest pain, unspecified chest pain type   Patient with a history of multiple PE's  and DVT's presents for chest pain that began this morning upon awakening that radiates down left arm with pain and swelling.  She state that she had a stress test that was normal last week by her cardiologist in [place], Dr. Mauricio Po. She has a negative workup with normal labs, two negative troponin's, and a non concerning EKG that is similar to prior. Her CT is negative for a new PE or aortic dissection.  She has a stable nonocclusive chronic PE in the bilateral lower lobe pulmonary artery branches. Her left upper extremity doppler is negative for DVT. I am concerned due to her continued chest pain and multiple risk factors including DM, HTN, hyperlipidemia, and stroke. I have not given the patient anticoagulants in the ED since she is on xarelto.  I discussed this patient with Dr. Johnney Killian who suggested consulting cardiology and did not think the patient needed aspirin at this time since she was ready anticoagulated.  Upon recheck of the patients she is still having chest pain.  I spoke to Dr. Irish Lack from cardiology who states that she is low risk for major cardiac event and would need BP control. She is not a cath candidate at this time.  I spoke to Dr. Roel Cluck regarding the patient's unresolved chest pain. She will see the patient in the ED and I will admit to med surg for overnight observation.     Ottie Glazier, PA-C 05/13/15 0109  Charlesetta Shanks, MD 05/13/15 [phone]

## 2015-05-12 NOTE — H&P (Addendum)
PCP:  Eliott Nine [address]  Cardiology [facility] Endocrinology [facility]  Referring provider Orvil Feil   Chief Complaint:  Chest pain  HPI: Makayla Frazier is a 44 y.o. female   has a past medical history of PE (pulmonary embolism); Syncopal episodes; Dysfunctional uterine bleeding; Asthma; Obesity; Mastodynia; Hypertension; Post - coital bleeding (2012); Labial lesion (2013); BV (bacterial vaginosis) (2012); Oligomenorrhea (2011); DVT (deep venous thrombosis) (2011); H/O hypercoagulable state; HLD (hyperlipidemia); DM (diabetes mellitus); Anemia; Herpes simplex without mention of complication (08/3789); Thyroid disease; Vitamin D deficiency; Major depression; Venous insufficiency; Sleep apnea; Neuropathy; Stroke; Headache(784.0); Neuromuscular disorder; Arthritis; Status post placement of implantable loop recorder; Complication of anesthesia; and Seizures (2015).   Presented with  Patients has hx of chest pain going on for the past few years she have had a stress last week in [place]. This particular episode started this AM. She was asleep and woke up with her left arm feeling tingling and swollen with a knot on the radial aspect of the wrist she pushed on it a bit and it went away. Patient states she propped her arm up and the swelling resolved. This was associated with left side chest pain non radiating and continuous since this AM. IT was getting better but got worse again when she bent over. She states pain is 7/10. Worse with deep breathing. CT angio of the chest negative for PE. US showed no evidence of Left arm DVT. She states she has recurrent chest pain episodes happen at least few times a month sometimes associated with taking a breath of cold air Patient has known history of asthma which is followed currently by pulmonologist. Repeated to Symbicort Singulair and Claritin Patient has hx for recurrent PE last was 2014 currently on Xarelto.   Patient states she have been evaluated by Hematology who felt it was due to contraceptives. Patient has history of recurrent transient on loss of consciousness since 2009 has been aggressively evaluated with cardiologist she is have had echo grams which was unremarkable. Loop recording done. Patient is currently on disability and unable to drive. She had history of questionable seizure-like events evaluated by neurology currently on Keppra and Topamax. Patient states she has been seen by neurology at [facility]. EEG done in 2015 was unremarkable.  Patient reports history of TIA with residual left-sided weakness. She occasionally has to use a cane. There are neurology note she had had recurrent MRIs which did not show any evidence of stroke.   She has history of diabetes insulin-dependent. Somewhat of a poor control.  Hospitalist was called for admission for Chest pain. In emergency department cardiology was called regarding ongoing chest pain. They recommended admission for observation and cycling cardiac enzymes  Review of Systems:    Pertinent positives include:   chest pain, left arm swelling.   Constitutional:  No weight loss, night sweats, Fevers, chills, fatigue, weight loss  HEENT:  No headaches, Difficulty swallowing,Tooth/dental problems,Sore throat,  No sneezing, itching, ear ache, nasal congestion, post nasal drip,  Cardio-vascular:  No Orthopnea, PND, anasarca, dizziness, palpitations.no Bilateral lower extremity swelling  GI:  No heartburn, indigestion, abdominal pain, nausea, vomiting, diarrhea, change in bowel habits, loss of appetite, melena, blood in stool, hematemesis Resp:  no shortness of breath at rest. No dyspnea on exertion, No excess mucus, no productive cough, No non-productive cough, No coughing up of blood.No change in color of mucus.No wheezing. Skin:  no rash or lesions. No jaundice GU:  no dysuria,  change in color of urine, no urgency or frequency. No straining  to urinate.  No flank pain.  Musculoskeletal:  No joint pain or no joint swelling. No decreased range of motion. No back pain.  Psych:  No change in mood or affect. No depression or anxiety. No memory loss.  Neuro: no localizing neurological complaints, no tingling, no weakness, no double vision, no gait abnormality, no slurred speech, no confusion  Otherwise ROS are negative except for above, 10 systems were reviewed  Past Medical History: Past Medical History  Diagnosis Date  . PE (pulmonary embolism)     2009 - on oral contraception; multiple  . Syncopal episodes     unknown etiology  . Dysfunctional uterine bleeding     Seen by Dr. Leo Grosser, GYN; pending hysterectomy as of 07/2012  . Asthma   . Obesity   . Mastodynia   . Hypertension   . Post - coital bleeding 2012  . Labial lesion 2013  . BV (bacterial vaginosis) 2012  . Oligomenorrhea 2011  . DVT (deep venous thrombosis) 2011    pt had IUD; also 05/2012  . H/O hypercoagulable state     2/2 contraception  . HLD (hyperlipidemia)   . DM (diabetes mellitus)     diagnosed 2009  . Anemia   . Herpes simplex without mention of complication 05/2129    HSV-2  . Thyroid disease     hypothyroidism (pt denies)  . Vitamin D deficiency     unknown to pt.  . Major depression     Hx suicide attempt in 2009, [facility] admissions  . Venous insufficiency   . Sleep apnea   . Neuropathy   . Stroke     TIA 2013  . Headache(784.0)     migraines  . Neuromuscular disorder     neuropathy  . Arthritis     Left leg  . Status post placement of implantable loop recorder   . Complication of anesthesia     Seizures in PACU after surgery 3/15  . Seizures 2015    last episode May 2015   Past Surgical History  Procedure Laterality Date  . Breast reduction surgery      Dr. Eugene Garnet [facility] 2011  . Cholecystectomy    . Cardiac electrophysiology study & dft      Implantable Loop recorder; no arrhythmias associated with synocpal spells; loop  explanted 07-2013  . Endometrial biopsy  2011  . Dilatation & currettage/hysteroscopy with resectocope  2007 & 2013  . Laparoscopic assisted vaginal hysterectomy  12/18/2012    Procedure: LAPAROSCOPIC ASSISTED VAGINAL HYSTERECTOMY;  Surgeon: Eldred Manges, MD;  Location: [facility];  Service: Gynecology;  Laterality: N/A;  . Iud removal  12/18/2012    Procedure: INTRAUTERINE DEVICE (IUD) REMOVAL;  Surgeon: Eldred Manges, MD;  Location: [facility];  Service: Gynecology;  Laterality: N/A;  Removed during prep by E. Florene Glen PA  . Bilateral salpingectomy  12/18/2012    Procedure: BILATERAL SALPINGECTOMY;  Surgeon: Eldred Manges, MD;  Location: [facility];  Service: Gynecology;  Laterality: Bilateral;  . Abdominal hysterectomy    . Bilateral oophorectomy    . Bladder suspension N/A 02/13/2014    Procedure: TRANSVAGINAL TAPE (TVT) PROCEDURE;  Surgeon: Delice Lesch, MD;  Location: [facility];  Service: Gynecology;  Laterality: N/A;  . Wisdom tooth extraction    . Transvaginal tape (tvt) removal N/A 04/28/2014    Procedure: Removal of suburethral mesh;  Surgeon: Delice Lesch, MD;  Location:  [facility];  Service: Gynecology;  Laterality: N/A;  . Loop recorder explant N/A 07/17/2013    Procedure: LOOP RECORDER EXPLANT;  Surgeon: Thompson Grayer, MD;  Location: [facility] CATH LAB;  Service: Cardiovascular;  Laterality: N/A;     Medications: Prior to Admission medications   Medication Sig Start Date End Date Taking? Authorizing Provider  albuterol (PROVENTIL HFA;VENTOLIN HFA) 108 (90 BASE) MCG/ACT inhaler Inhale 2 puffs into the lungs every 6 (six) hours as needed for wheezing or shortness of breath.   Yes Historical Provider, MD  amLODipine (NORVASC) 10 MG tablet Take 10 mg by mouth at bedtime.    Yes Historical Provider, MD  atorvastatin (LIPITOR) 10 MG tablet Take 1 tablet by mouth daily. 01/08/14  Yes Historical Provider, MD  budesonide-formoterol (SYMBICORT) 80-4.5 MCG/ACT inhaler Inhale 2 puffs into the lungs daily.    Yes Historical Provider, MD  Insulin Degludec 200 UNIT/ML SOPN Inject 50 Units into the skin at bedtime.   Yes Historical Provider, MD  INVOKAMET 150-500 MG TABS Take 1 tablet by mouth 2 (two) times daily.  09/25/14  Yes Historical Provider, MD  levETIRAcetam (KEPPRA) 500 MG tablet Take 500 mg by mouth 2 (two) times daily. 01/14/15 01/14/16 Yes Historical Provider, MD  lisinopril-hydrochlorothiazide (PRINZIDE,ZESTORETIC) 10-12.5 MG per tablet Take 1 tablet by mouth daily. 04/08/15  Yes Historical Provider, MD  loratadine (CLARITIN) 10 MG tablet Take 10 mg by mouth daily as needed for allergies.  08/04/13  Yes Historical Provider, MD  montelukast (SINGULAIR) 10 MG tablet Take 1 tablet by mouth at bedtime. 04/13/15  Yes Historical Provider, MD  NOVOLOG FLEXPEN 100 UNIT/ML FlexPen Inject 9 Units into the skin 3 (three) times daily before meals. Takes if suger > 200 01/08/14  Yes Historical Provider, MD  ondansetron (ZOFRAN) 4 MG tablet Take 1 tablet (4 mg total) by mouth every 6 (six) hours. 03/02/15  Yes Nicole Pisciotta, PA-C  Rivaroxaban (XARELTO) 20 MG TABS Take 20 mg by mouth at bedtime.    Yes Historical Provider, MD  rizatriptan (MAXALT-MLT) 5 MG disintegrating tablet Take 1 tablet (5 mg total) by mouth as needed for migraine. May repeat in 2 hours if needed 05/07/14  Yes Marcial Pacas, MD  topiramate (TOPAMAX) 100 MG tablet Take 100 mg by mouth 2 (two) times daily.   Yes Historical Provider, MD  oxyCODONE-acetaminophen (PERCOCET/ROXICET) 5-325 MG per tablet 1 to 2 tabs PO q6hrs  PRN for pain Patient not taking: Reported on 05/12/2015 03/02/15   Elmyra Ricks Pisciotta, PA-C    Allergies:   Allergies  Allergen Reactions  . Codeine Nausea And Vomiting       . Darvocet [Propoxyphene N-Acetaminophen] Nausea And Vomiting    vomiting  . Hydrocodone-Acetaminophen Nausea And Vomiting    Vomiting   . Latex Rash    rash  . Tramadol Nausea Only    Social History:  Ambulatory   independently   Lives at home  alone   reports that she has never smoked. She has never used smokeless tobacco. She reports that she drinks alcohol. She reports that she does not use illicit drugs.    Family History: family history includes Breast cancer in her paternal grandmother; Diabetes type II in her maternal grandmother; Melanoma (age of onset: 9) in her father; Pulmonary embolism in her paternal grandfather; Ulcerative colitis (age of onset: 7) in her mother.    Physical Exam: Patient Vitals for the past 24 hrs:  BP Temp Temp src Pulse Resp SpO2 Height Weight  05/12/15 1626  174/97 mmHg - - 79 16 98 % - -  05/12/15 1527 - 98.7 F (37.1 C) Oral - - - - -  05/12/15 1427 (!) 159/102 mmHg - - 82 15 98 % - -  05/12/15 1322 (!) 199/114 mmHg - Oral 93 24 99 % 5' (1.524 m) 78.472 kg (173 lb)    1. General:  in No Acute distress 2. Psychological: Alert and  Oriented 3. Head/ENT:   Moist   Mucous Membranes                          Head Non traumatic, neck supple                          Normal   Dentition 4. SKIN: normal   Skin turgor,  Skin clean Dry and intact no rash 5. Heart: Regular rate and rhythm no Murmur, Rub or gallop 6. Lungs: Clear to auscultation bilaterally, no wheezes or crackles   7. Abdomen: Soft, non-tender, Non distended 8. Lower extremities: no clubbing, cyanosis, or edema 9. Neurologically diminished strength on the left upper and lower extremities chronic  10. MSK: Normal range of motion, chest tender to palpation and reproduces patient's chest pain  body mass index is 33.79 kg/(m^2).   Labs on Admission:   Results for orders placed or performed during the hospital encounter of 05/12/15 (from the past 24 hour(s))  I-stat troponin, ED  (not at [facility])     Status: None   Collection Time: 05/12/15  1:52 PM  Result Value Ref Range   Troponin i, poc 0.01 0.00 - 0.08 ng/mL   Comment 3          BNP (order ONLY if patient complains of dyspnea/SOB AND you have documented it for THIS visit)      Status: None   Collection Time: 05/12/15  1:59 PM  Result Value Ref Range   B Natriuretic Peptide 19.8 0.0 - 100.0 pg/mL  CBC     Status: Abnormal   Collection Time: 05/12/15  2:00 PM  Result Value Ref Range   WBC 12.1 (H) 4.0 - 10.5 K/uL   RBC 5.56 (H) 3.87 - 5.11 MIL/uL   Hemoglobin 14.1 12.0 - 15.0 g/dL   HCT 43.4 36.0 - 46.0 %   MCV 78.1 78.0 - 100.0 fL   MCH 25.4 (L) 26.0 - 34.0 pg   MCHC 32.5 30.0 - 36.0 g/dL   RDW 15.2 11.5 - 15.5 %   Platelets 323 150 - 400 K/uL  Basic metabolic panel     Status: Abnormal   Collection Time: 05/12/15  2:00 PM  Result Value Ref Range   Sodium 135 135 - 145 mmol/L   Potassium 3.5 3.5 - 5.1 mmol/L   Chloride 99 (L) 101 - 111 mmol/L   CO2 23 22 - 32 mmol/L   Glucose, Bld 197 (H) 65 - 99 mg/dL   BUN 7 6 - 20 mg/dL   Creatinine, Ser 0.64 0.44 - 1.00 mg/dL   Calcium 9.3 8.9 - 10.3 mg/dL   GFR calc non Af Amer >60 >60 mL/min   GFR calc Af Amer >60 >60 mL/min   Anion gap 13 5 - 15  I-Stat Troponin, ED (not at [facility])     Status: None   Collection Time: 05/12/15  5:24 PM  Result Value Ref Range   Troponin i, poc 0.00 0.00 - 0.08 ng/mL  Comment 3            UA not obtained  Lab Results  Component Value Date   HGBA1C 10.7* 02/13/2014    Estimated Creatinine Clearance: 84 mL/min (by C-G formula based on Cr of 0.64).  BNP (last 3 results) No results for input(s): PROBNP in the last 8760 hours.  Other results:  I have pearsonaly reviewed this: ECG REPORT  Rate:81  Rhythm: NSR ST&T Change: No ischemic changes QTC 422   Filed Weights   05/12/15 1322  Weight: 78.472 kg (173 lb)     Cultures:    Component Value Date/Time   SDES URINE, CLEAN CATCH 01/27/2014 1140   SPECREQUEST A 01/27/2014 1140   CULT  01/27/2014 1140    ESCHERICHIA COLI Performed at [place] 01/29/2014 FINAL 01/27/2014 1140     Radiological Exams on Admission: Dg Chest 2 View  05/12/2015   CLINICAL DATA:  One day history of  chest pain radiating into left upper extremity  EXAM: CHEST  2 VIEW  COMPARISON:  Chest radiograph March 02, 2015 and chest CT March 02, 2015  FINDINGS: There is no edema or consolidation. Heart size and pulmonary vascularity are normal. No adenopathy. No pneumothorax. No bone lesions. There are surgical clips in the right upper quadrant.  IMPRESSION: No edema or consolidation.   Electronically Signed   By: Lowella Grip III M.D.   On: 05/12/2015 14:24   Ct Angio Chest Pe W/cm &/or Wo Cm  05/12/2015   CLINICAL DATA:  Left chest pain, shortness of breath x5 hours  EXAM: CT ANGIOGRAPHY CHEST WITH CONTRAST  TECHNIQUE: Multidetector CT imaging of the chest was performed using the standard protocol during bolus administration of intravenous contrast. Multiplanar CT image reconstructions and MIPs were obtained to evaluate the vascular anatomy.  CONTRAST:  47mL OMNIPAQUE IOHEXOL 350 MG/ML SOLN  COMPARISON:  03/02/2015  FINDINGS: Right arm contrast injection. The SVC is patent. Right ventricle is nondilated. Satisfactory opacification of pulmonary arteries noted, and there is no evidence of acute pulmonary emboli. There are nonocclusive intraluminal webs at the bifurcation of the right lower lobe branch of the pulmonary artery. There is some chronic eccentric wall thickening and intraluminal webs at the branching of the left lower lobe pulmonary artery. Patent pulmonary veins bilaterally. Adequate contrast opacification of the thoracic aorta with no evidence of dissection, aneurysm, or stenosis. There is classic 3-vessel brachiocephalic arch anatomy without proximal stenosis. No significant atheromatous plaque. No pleural or pericardial effusion. No hilar or mediastinal adenopathy. Scattered Small blebs bilaterally. Dependent atelectasis posteriorly in the lower lobes , right worse than left. Thoracic spine and sternum intact. Fatty liver. Remainder visualized upper abdomen unremarkable.  Review of the MIP images  confirms the above findings.  IMPRESSION: 1. Negative for acute PE or thoracic aortic dissection. 2. Stable nonocclusive changes of chronic PE in bilateral lower lobe pulmonary artery branches.   Electronically Signed   By: Lucrezia Europe M.D.   On: 05/12/2015 16:29    Chart has been reviewed  Family  at  Bedside  plan of care was discussed with Mother Dickie La [phone]  Assessment/Plan  44 year old female with history of multiple PEs, asthma, hypertension, diabetes, seizure disorder presents with chest pain which is atypical. Recent history of negative stress test  Present on Admission:  . Chest pain-  atypical but cardiology recommended observation overnight. We'll monitor on telemetry cycle cardiac enzymes repeate serial EKG  . Pulmonary embolism -  continue anticoagulation. In emergency department CT of the chest showed no evidence of recurrent PE  . Asthma currently does not appear to have acute exacerbation continue home medications  . Essential hypertension continue home medications currently appears to be stable although somewhat elevated blood pressures. We'll make sure patient resumes her home regimen Diabetes mellitus or restart patient's insulin and by mouth meds Leukocytosis - likely hemoconcentration but will obtain UA Left arm swelling currently improved no evidence of DVT Prophylaxis:   Xarelto  CODE STATUS:  FULL CODE   as per patient    Disposition:  To home once workup is complete and patient is stable  Other plan as per orders.  I have spent a total of 55 min on this admission  Solomiya Pascale 05/12/2015, 6:35 PM  [facility]  Pager [phone]   after 2 AM please page floor coverage PA If 7AM-7PM, please contact the day team taking care of the patient  [email or web address]  Password [email or web address]

## 2015-05-13 DIAGNOSIS — R0789 Other chest pain: Secondary | ICD-10-CM | POA: Diagnosis not present

## 2015-05-13 DIAGNOSIS — J45909 Unspecified asthma, uncomplicated: Secondary | ICD-10-CM | POA: Diagnosis not present

## 2015-05-13 DIAGNOSIS — E119 Type 2 diabetes mellitus without complications: Secondary | ICD-10-CM | POA: Diagnosis not present

## 2015-05-13 DIAGNOSIS — I10 Essential (primary) hypertension: Secondary | ICD-10-CM

## 2015-05-13 DIAGNOSIS — R079 Chest pain, unspecified: Secondary | ICD-10-CM | POA: Diagnosis not present

## 2015-05-13 DIAGNOSIS — E1165 Type 2 diabetes mellitus with hyperglycemia: Secondary | ICD-10-CM | POA: Diagnosis not present

## 2015-05-13 LAB — URINALYSIS, ROUTINE W REFLEX MICROSCOPIC
Bilirubin Urine: NEGATIVE
Glucose, UA: >1000 mg/dL — AB
HGB URINE DIPSTICK: NEGATIVE
KETONES UR: NEGATIVE mg/dL
Leukocytes, UA: NEGATIVE
NITRITE: NEGATIVE
Protein, ur: NEGATIVE mg/dL
Specific Gravity, Urine: 1.021 (ref 1.005–1.030)
Urobilinogen, UA: 0.2 mg/dL (ref 0.0–1.0)
pH: 6.5 (ref 5.0–8.0)

## 2015-05-13 LAB — TROPONIN I

## 2015-05-13 LAB — URINE MICROSCOPIC-ADD ON

## 2015-05-13 LAB — GLUCOSE, CAPILLARY
GLUCOSE-CAPILLARY: 197 mg/dL — AB (ref 65–99)
Glucose-Capillary: 165 mg/dL — ABNORMAL HIGH (ref 65–99)

## 2015-05-13 NOTE — Discharge Summary (Signed)
Physician Discharge Summary  Makayla Frazier VZD:[id number] DOB: 06-01-1971 DOA: 05/12/2015  PCP: Makayla Panning, NP  Admit date: 05/12/2015 Discharge date: 05/13/2015  Time spent: 25 minutes  Recommendations for Outpatient Follow-up:  1. Discharge home with outpatient PCP follow-up  Discharge Diagnoses:  Principal Problem:   Chest pain,Musculoskeletal  Active Problems:   DM w/o complication type II   Essential hypertension   Asthma   Pulmonary embolism   Essential hypertension, benign   Discharge Condition:  fair  Diet recommendation: diabetic  Family communication: Mother at bedside  CODE STATUS: Full code   [employer] Weights   05/12/15 1322 05/12/15 2126  Weight: 78.472 kg (173 lb) 77.883 kg (171 lb 11.2 oz)    History of present illness:  44 year old female with history of PE on anticoagulation, syncopal episodes, DOB, asthma, essential hypertension, history of DVT and hypercoagulable state, uncontrolled type 2 diabetes mellitus, anemia, depression, neuromuscular disorder and history of stroke, migraine disorder who presented to the ED with chest pain since one day. Patient had a stress test done by her cardiologist in [place] 1 week back and was told to be normal. Patient reports waking up with left-sided chest pain in her left arm with tingling and a knot formation in the left forearm. Vitals in the ED showed elevated blood pressure. CT angiogram of the chest was done to rule out PE and was negative. Ultrasound of the left arm was done which is negative for DVT. Patient also has history of recurrent transient loss of consciousness since 2009 and has been evaluated aggressively by cardiology as outpatient. Also has history of? Seizure-like events and is on Keppra and Topamax and follows with outpatient neurology.    Hospital Course:  Chest pain Appears to be atypical and likely muscular skeletal. Patient has been stable on telemetry. Serial troponins have been  negative. EKG unremarkable. I called her cardiologist office (Dr. Clent Frazier in  [place]) and received her recent stress test results which was negative for ischemia and showed normal EF. Continue statin.  Uncontrolled type 2 diabetes mellitus   continue home dose insulin and invokamet. Follow-up with PCP as outpatient.  Essential hypertension Blood pressure elevated on admission however had a low blood pressure this morning and home medications were held. BP has currently improved and she may resume her home or pressure medications.  ? Seizure disorder Continue Keppra and Topamax.  Remaining medical issues are stable. Patient is stable to be discharged home with outpatient follow-up.   Procedures:  None  Consultations:  None  Discharge Exam: Filed Vitals:   05/13/15 1306  BP: 103/57  Pulse: 71  Temp: 97.7 F (36.5 C)  Resp: 16    General: [name] in no acute distress HEENT: No pallor, moist oral mucosa, supple neck   chest: Clear to auscultation bilaterally, no added sounds Cardiovascular: N S1 and S2, no murmurs rub or gallop Respiratory: clear bilaterally, no added sounds GI: Soft, nondistended, nontender, bowel sounds present Musculoskeletal: Warm, no edema CNS: Alert and oriented   Discharge Instructions    Current Discharge Medication List    CONTINUE these medications which have NOT CHANGED   Details  albuterol (PROVENTIL HFA;VENTOLIN HFA) 108 (90 BASE) MCG/ACT inhaler Inhale 2 puffs into the lungs every 6 (six) hours as needed for wheezing or shortness of breath.    amLODipine (NORVASC) 10 MG tablet Take 10 mg by mouth at bedtime.     atorvastatin (LIPITOR) 10 MG tablet Take 1 tablet by mouth daily.  budesonide-formoterol (SYMBICORT) 80-4.5 MCG/ACT inhaler Inhale 2 puffs into the lungs daily.    Insulin Degludec 200 UNIT/ML SOPN Inject 50 Units into the skin at bedtime.    INVOKAMET 150-500 MG TABS Take 1 tablet by mouth 2 (two) times  daily.     levETIRAcetam (KEPPRA) 500 MG tablet Take 500 mg by mouth 2 (two) times daily.    lisinopril-hydrochlorothiazide (PRINZIDE,ZESTORETIC) 10-12.5 MG per tablet Take 1 tablet by mouth daily.    loratadine (CLARITIN) 10 MG tablet Take 10 mg by mouth daily as needed for allergies.     montelukast (SINGULAIR) 10 MG tablet Take 1 tablet by mouth at bedtime.    NOVOLOG FLEXPEN 100 UNIT/ML FlexPen Inject 9 Units into the skin 3 (three) times daily before meals. Takes if suger > 200    ondansetron (ZOFRAN) 4 MG tablet Take 1 tablet (4 mg total) by mouth every 6 (six) hours. Qty: 12 tablet, Refills: 0    Rivaroxaban (XARELTO) 20 MG TABS Take 20 mg by mouth at bedtime.     rizatriptan (MAXALT-MLT) 5 MG disintegrating tablet Take 1 tablet (5 mg total) by mouth as needed for migraine. May repeat in 2 hours if needed Qty: 15 tablet, Refills: 12    topiramate (TOPAMAX) 100 MG tablet Take 100 mg by mouth 2 (two) times daily.      STOP taking these medications     oxyCODONE-acetaminophen (PERCOCET/ROXICET) 5-325 MG per tablet        Allergies  Allergen Reactions  . Codeine Nausea And Vomiting       . Darvocet [Propoxyphene N-Acetaminophen] Nausea And Vomiting    vomiting  . Hydrocodone-Acetaminophen Nausea And Vomiting    Vomiting   . Latex Rash    rash  . Tramadol Nausea Only   Follow-up Information    Follow up with Smothers, Andree Elk, NP. Schedule an appointment as soon as possible for a visit in 2 weeks.   Specialty:  Nurse Practitioner   Contact information:   [address] [place] [address] [phone]        The results of significant diagnostics from this hospitalization (including imaging, microbiology, ancillary and laboratory) are listed below for reference.    Significant Diagnostic Studies: Dg Chest 2 View  05/12/2015   CLINICAL DATA:  One day history of chest pain radiating into left upper extremity  EXAM: CHEST  2 VIEW  COMPARISON:   Chest radiograph March 02, 2015 and chest CT March 02, 2015  FINDINGS: There is no edema or consolidation. Heart size and pulmonary vascularity are normal. No adenopathy. No pneumothorax. No bone lesions. There are surgical clips in the right upper quadrant.  IMPRESSION: No edema or consolidation.   Electronically Signed   By: Lowella Grip III M.D.   On: 05/12/2015 14:24   Ct Angio Chest Pe W/cm &/or Wo Cm  05/12/2015   CLINICAL DATA:  Left chest pain, shortness of breath x5 hours  EXAM: CT ANGIOGRAPHY CHEST WITH CONTRAST  TECHNIQUE: Multidetector CT imaging of the chest was performed using the standard protocol during bolus administration of intravenous contrast. Multiplanar CT image reconstructions and MIPs were obtained to evaluate the vascular anatomy.  CONTRAST:  41mL OMNIPAQUE IOHEXOL 350 MG/ML SOLN  COMPARISON:  03/02/2015  FINDINGS: Right arm contrast injection. The SVC is patent. Right ventricle is nondilated. Satisfactory opacification of pulmonary arteries noted, and there is no evidence of acute pulmonary emboli. There are nonocclusive intraluminal webs at the bifurcation of the  right lower lobe branch of the pulmonary artery. There is some chronic eccentric wall thickening and intraluminal webs at the branching of the left lower lobe pulmonary artery. Patent pulmonary veins bilaterally. Adequate contrast opacification of the thoracic aorta with no evidence of dissection, aneurysm, or stenosis. There is classic 3-vessel brachiocephalic arch anatomy without proximal stenosis. No significant atheromatous plaque. No pleural or pericardial effusion. No hilar or mediastinal adenopathy. Scattered Small blebs bilaterally. Dependent atelectasis posteriorly in the lower lobes , right worse than left. Thoracic spine and sternum intact. Fatty liver. Remainder visualized upper abdomen unremarkable.  Review of the MIP images confirms the above findings.  IMPRESSION: 1. Negative for acute PE or thoracic aortic  dissection. 2. Stable nonocclusive changes of chronic PE in bilateral lower lobe pulmonary artery branches.   Electronically Signed   By: Lucrezia Europe M.D.   On: 05/12/2015 16:29    Microbiology: No results found for this or any previous visit (from the past 240 hour(s)).   Labs: Basic Metabolic Panel:  Recent Labs Lab 05/12/15 1400  NA 135  K 3.5  CL 99*  CO2 23  GLUCOSE 197*  BUN 7  CREATININE 0.64  CALCIUM 9.3   Liver Function Tests: No results for input(s): AST, ALT, ALKPHOS, BILITOT, PROT, ALBUMIN in the last 168 hours. No results for input(s): LIPASE, AMYLASE in the last 168 hours. No results for input(s): AMMONIA in the last 168 hours. CBC:  Recent Labs Lab 05/12/15 1400  WBC 12.1*  HGB 14.1  HCT 43.4  MCV 78.1  PLT 323   Cardiac Enzymes:  Recent Labs Lab 05/12/15 2205 05/13/15 05/13/15 0811  TROPONINI <0.03 <0.03 <0.03   BNP: BNP (last 3 results)  Recent Labs  03/02/15 1230 05/12/15 1359  BNP 40.1 19.8    ProBNP (last 3 results) No results for input(s): PROBNP in the last 8760 hours.  CBG:  Recent Labs Lab 05/12/15 2224 05/13/15 0745 05/13/15 1135  GLUCAP 220* 165* 197*       Signed:  Huie Ghuman  [facility] 05/13/2015, 3:00 PM

## 2015-05-14 DIAGNOSIS — I2782 Chronic pulmonary embolism: Secondary | ICD-10-CM | POA: Insufficient documentation

## 2015-05-14 DIAGNOSIS — Z7901 Long term (current) use of anticoagulants: Secondary | ICD-10-CM | POA: Insufficient documentation

## 2015-05-14 LAB — HEMOGLOBIN A1C
HEMOGLOBIN A1C: 9.8 % — AB (ref 4.8–5.6)
Mean Plasma Glucose: 235 mg/dL

## 2015-05-17 IMAGING — CT CT MAXILLOFACIAL W/O CM
2 of 4 series · 15 of 30 positions shown, 18 images · non-contrast
Comparison: CT of the head and MRI of the brain performed
02/03/2013

CT HEAD

CLINICAL DATA: Multiple seizures; concern for head or facial
injury.

CT HEAD WITHOUT CONTRAST
CT MAXILLOFACIAL WITHOUT CONTRAST
TECHNIQUE: Multidetector CT imaging of the head and maxillofacial
structures were performed using the standard protocol without
intravenous contrast. Multiplanar CT image reconstructions of the
maxillofacial structures were also generated.

[Series 7: facial st · axial · 0.33mm/px · z∈[-236,-112]mm · 11 of 76 slices shown, 14 images]
[im 7/76  brain]
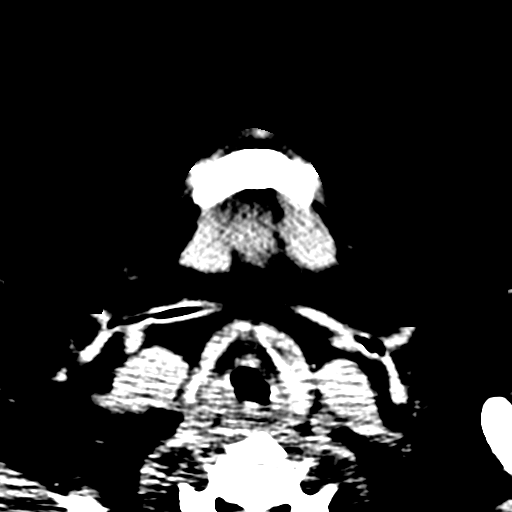
[im 7/76  bone]
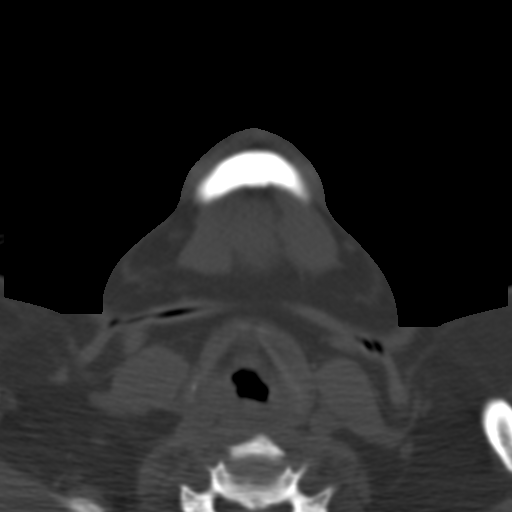
[im 13/76  bone]
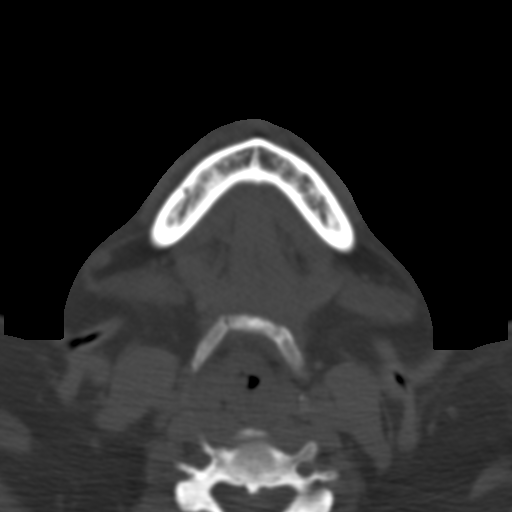
[im 19/76  bone]
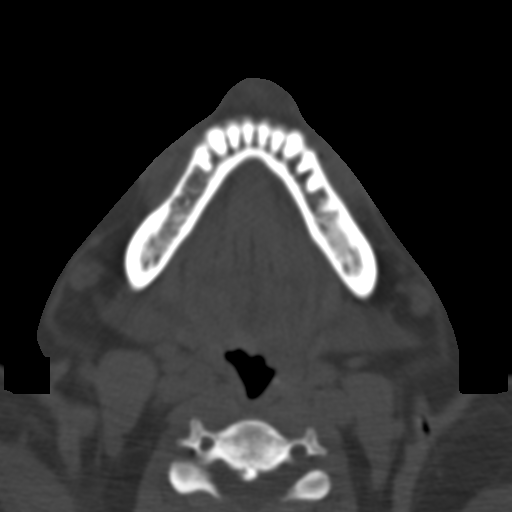
[im 26/76  bone]
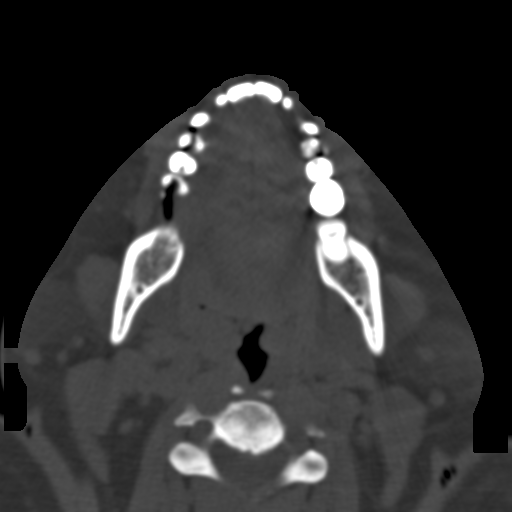
[im 32/76  brain]
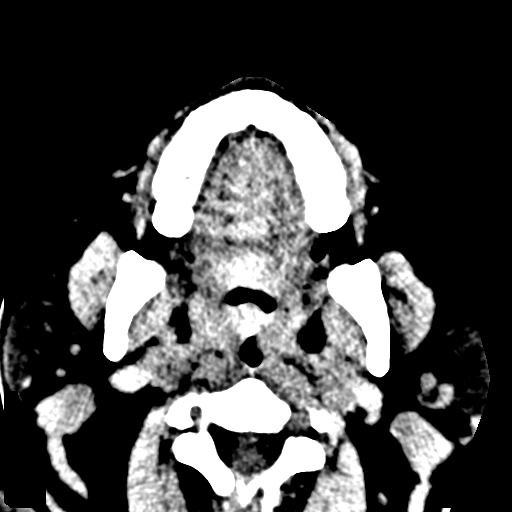
[im 32/76  bone]
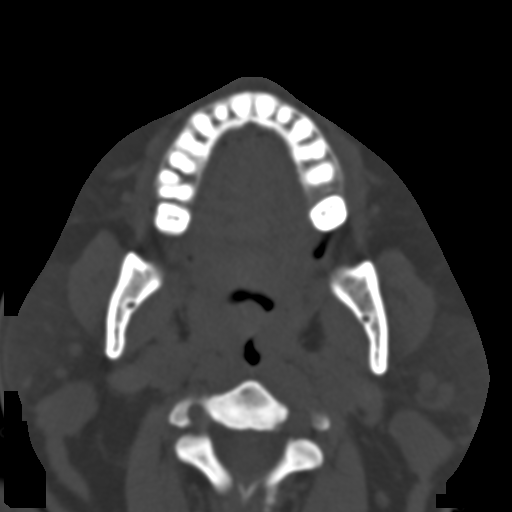
[im 38/76  bone]
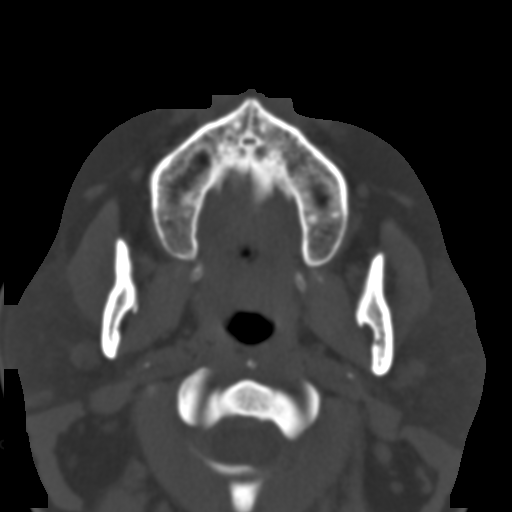
[im 44/76  bone]
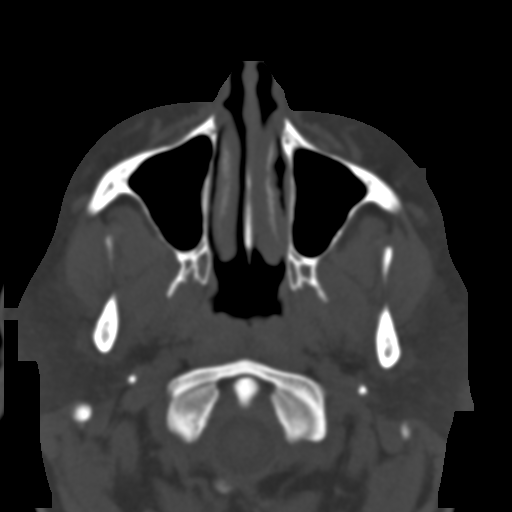
[im 51/76  bone]
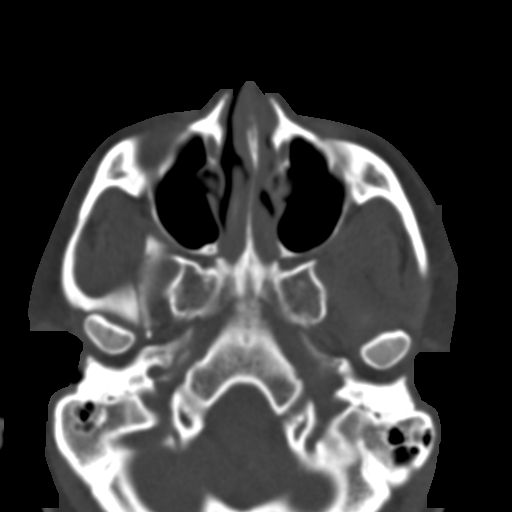
[im 57/76  brain]
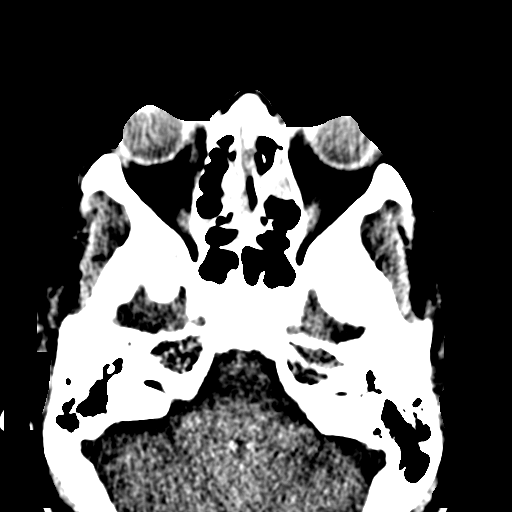
[im 57/76  bone]
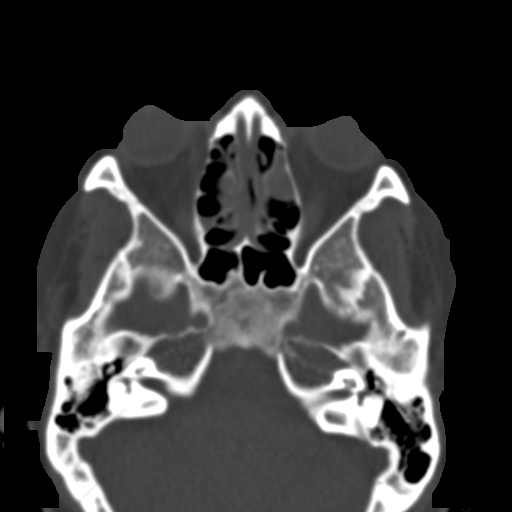
[im 63/76  bone]
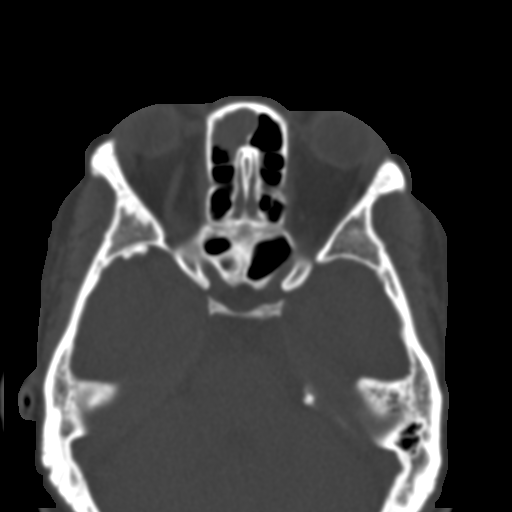
[im 69/76  bone]
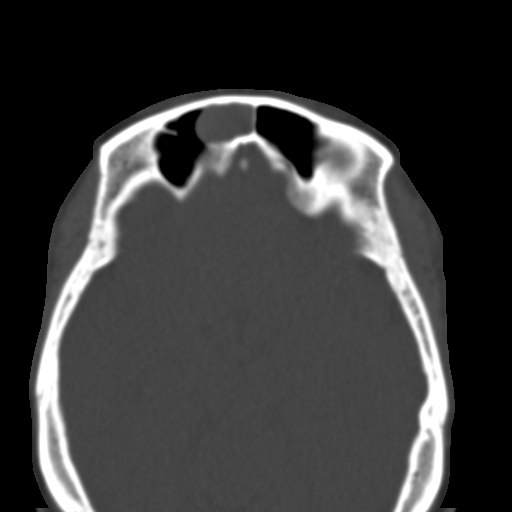

[Series 12: bone windows · axial · 0.43mm/px · z∈[-138,-78]mm · 4 of 48 slices shown]
[im 7/48  bone]
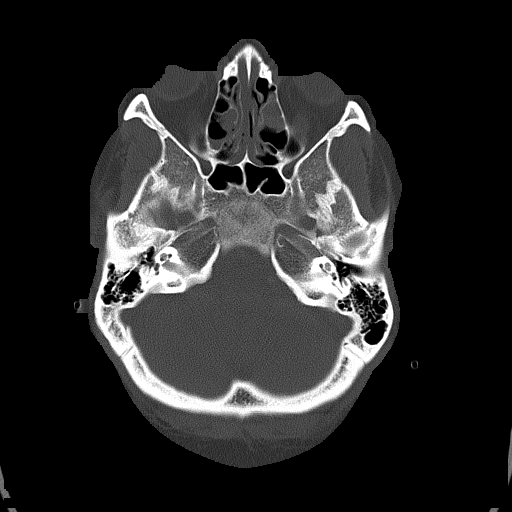
[im 14/48  bone]
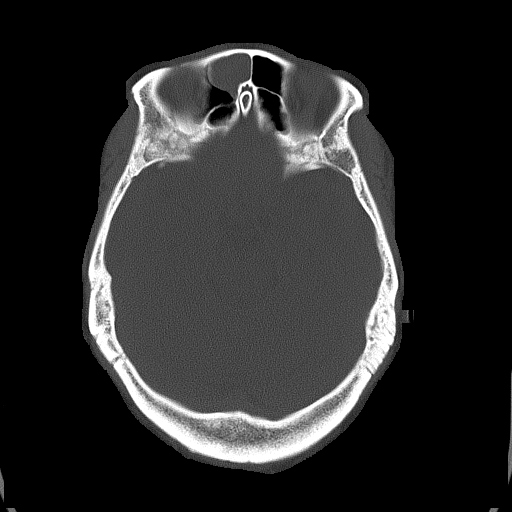
[im 21/48  bone]
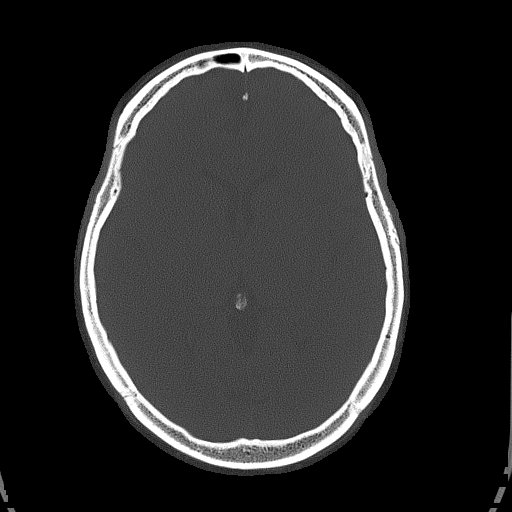
[im 27/48  bone]
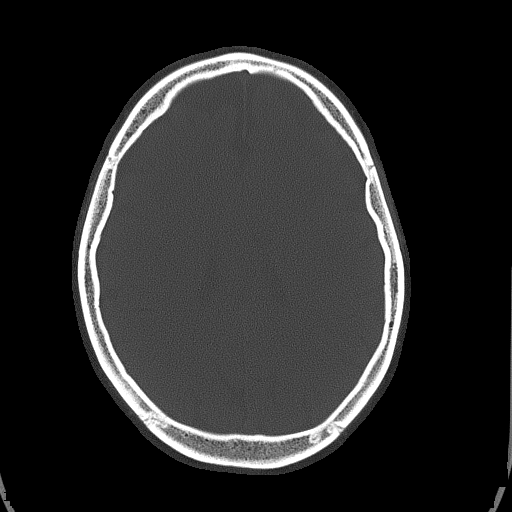

[15 of 30 positions shown; findings below may reference images not displayed]

FINDINGS: There is no evidence of acute infarction, mass lesion, or
intra- or extra-axial hemorrhage on CT.

[Prominence of the sulci reflects mild cortical volume loss.]  The
posterior fossa, including the cerebellum, brainstem and fourth
ventricle, is within normal limits.  The third and lateral
ventricles, and basal ganglia are unremarkable in appearance.  The
cerebral hemispheres are symmetric in appearance, with normal gray-
white differentiation.  No mass effect or midline shift is seen.

There is no evidence of fracture; visualized osseous structures are
unremarkable in appearance.  The orbits are within normal limits.
A mucus retention cyst or polyp is noted within the right frontal
sinus; the remaining paranasal sinuses and mastoid air cells are
well-aerated.  No significant soft tissue abnormalities are seen.
IMPRESSION: 1.  No evidence of traumatic intracranial injury or fracture.
2.  Mild cortical volume loss noted.
3.  Mucus retention cyst or polyp in the right frontal sinus.

CT MAXILLOFACIAL
FINDINGS: There is no evidence of fracture or dislocation.  The
maxilla and mandible appear intact.  The nasal bone is unremarkable
in appearance.  The visualized dentition demonstrates no acute
abnormality.

The orbits are intact bilaterally.  A mucus retention cyst or polyp
is noted within the right frontal sinus; the remaining visualized
paranasal sinuses and mastoid air cells are well-aerated.

No significant soft tissue abnormalities are seen.  The
parapharyngeal fat planes are preserved.  The nasopharynx,
oropharynx and hypopharynx are unremarkable in appearance.  The
visualized portions of the valleculae and piriform sinuses are
grossly unremarkable.

The parotid and submandibular glands are within normal limits.  No
cervical lymphadenopathy is seen.
IMPRESSION: 1.  No evidence of fracture or dislocation.
2.  Mucus retention cyst or polyp within the right frontal sinus.

## 2015-05-30 ENCOUNTER — Inpatient Hospital Stay (HOSPITAL_COMMUNITY)
Admission: AD | Admit: 2015-05-30 | Discharge: 2015-05-30 | Disposition: A | Discharge status: Home / Self Care | Payer: Medicare Other | Source: Ambulatory Visit | Attending: Obstetrics and Gynecology | Admitting: Obstetrics and Gynecology

## 2015-05-30 ENCOUNTER — Encounter (HOSPITAL_COMMUNITY): Payer: Self-pay | Admitting: *Deleted

## 2015-05-30 ENCOUNTER — Inpatient Hospital Stay (HOSPITAL_COMMUNITY): Payer: Medicare Other

## 2015-05-30 DIAGNOSIS — Z90710 Acquired absence of both cervix and uterus: Secondary | ICD-10-CM | POA: Insufficient documentation

## 2015-05-30 DIAGNOSIS — Z8673 Personal history of transient ischemic attack (TIA), and cerebral infarction without residual deficits: Secondary | ICD-10-CM | POA: Diagnosis not present

## 2015-05-30 DIAGNOSIS — R1031 Right lower quadrant pain: Secondary | ICD-10-CM

## 2015-05-30 DIAGNOSIS — R079 Chest pain, unspecified: Secondary | ICD-10-CM | POA: Diagnosis not present

## 2015-05-30 DIAGNOSIS — Z86718 Personal history of other venous thrombosis and embolism: Secondary | ICD-10-CM | POA: Diagnosis not present

## 2015-05-30 DIAGNOSIS — Z86711 Personal history of pulmonary embolism: Secondary | ICD-10-CM | POA: Diagnosis not present

## 2015-05-30 DIAGNOSIS — Z9049 Acquired absence of other specified parts of digestive tract: Secondary | ICD-10-CM | POA: Insufficient documentation

## 2015-05-30 DIAGNOSIS — R109 Unspecified abdominal pain: Secondary | ICD-10-CM | POA: Diagnosis present

## 2015-05-30 HISTORY — DX: Acute pharyngitis, unspecified: J02.9

## 2015-05-30 HISTORY — DX: Unspecified abnormal cytological findings in specimens from vagina: R87.629

## 2015-05-30 HISTORY — DX: Unspecified infectious disease: B99.9

## 2015-05-30 LAB — COMPREHENSIVE METABOLIC PANEL
ALBUMIN: 3.9 g/dL (ref 3.5–5.0)
ALK PHOS: 72 U/L (ref 38–126)
ALT: 26 U/L (ref 14–54)
ANION GAP: 8 (ref 5–15)
AST: 19 U/L (ref 15–41)
BUN: 11 mg/dL (ref 6–20)
CHLORIDE: 99 mmol/L — AB (ref 101–111)
CO2: 26 mmol/L (ref 22–32)
Calcium: 9.4 mg/dL (ref 8.9–10.3)
Creatinine, Ser: 0.72 mg/dL (ref 0.44–1.00)
GFR calc Af Amer: >60 mL/min (ref >60)
GFR calc non Af Amer: >60 mL/min (ref >60)
GLUCOSE: 228 mg/dL — AB (ref 65–99)
POTASSIUM: 3.8 mmol/L (ref 3.5–5.1)
SODIUM: 133 mmol/L — AB (ref 135–145)
TOTAL PROTEIN: 7.6 g/dL (ref 6.5–8.1)
Total Bilirubin: 0.4 mg/dL (ref 0.3–1.2)

## 2015-05-30 LAB — CBC WITH DIFFERENTIAL/PLATELET
Basophils Absolute: 0 10*3/uL (ref 0.0–0.1)
Basophils Relative: 0 % (ref 0–1)
EOS ABS: 0.1 10*3/uL (ref 0.0–0.7)
Eosinophils Relative: 1 % (ref 0–5)
HEMATOCRIT: 41.9 % (ref 36.0–46.0)
Hemoglobin: 14.1 g/dL (ref 12.0–15.0)
Lymphocytes Relative: 40 % (ref 12–46)
Lymphs Abs: 4.2 10*3/uL — ABNORMAL HIGH (ref 0.7–4.0)
MCH: 26.1 pg (ref 26.0–34.0)
MCHC: 33.7 g/dL (ref 30.0–36.0)
MCV: 77.4 fL — ABNORMAL LOW (ref 78.0–100.0)
MONOS PCT: 6 % (ref 3–12)
Monocytes Absolute: 0.6 10*3/uL (ref 0.1–1.0)
NEUTROS ABS: 5.6 10*3/uL (ref 1.7–7.7)
Neutrophils Relative %: 53 % (ref 43–77)
Platelets: 350 10*3/uL (ref 150–400)
RBC: 5.41 MIL/uL — AB (ref 3.87–5.11)
RDW: 14.8 % (ref 11.5–15.5)
WBC: 10.5 10*3/uL (ref 4.0–10.5)

## 2015-05-30 LAB — URINALYSIS, ROUTINE W REFLEX MICROSCOPIC
Bilirubin Urine: NEGATIVE
Glucose, UA: >1000 mg/dL — AB
Hgb urine dipstick: NEGATIVE
Ketones, ur: NEGATIVE mg/dL
LEUKOCYTES UA: NEGATIVE
Nitrite: NEGATIVE
Protein, ur: NEGATIVE mg/dL
Specific Gravity, Urine: <1.005 — ABNORMAL LOW (ref 1.005–1.030)
UROBILINOGEN UA: 0.2 mg/dL (ref 0.0–1.0)
pH: 5 (ref 5.0–8.0)

## 2015-05-30 LAB — WET PREP, GENITAL
Trich, Wet Prep: NONE SEEN
YEAST WET PREP: NONE SEEN

## 2015-05-30 LAB — URINE MICROSCOPIC-ADD ON

## 2015-05-30 MED ORDER — OXYCODONE-ACETAMINOPHEN 5-325 MG PO TABS
1.0000 | ORAL_TABLET | Freq: Once | ORAL | Status: AC
  Administered 2015-05-30: 1 via ORAL
  Filled 2015-05-30: qty 1

## 2015-05-30 NOTE — MAU Note (Signed)
Reinforced meds and diet and importance of taking meds  (high BP meds and insulin); taking pain meds as prescribed- pt  Stated "don't like to take the pain meds"; I explained- but when you come in c/o pain and haven't that is the first thing we are going to do and they helped. Pain improved after 1 Percocet and her BP also came down.

## 2015-05-30 NOTE — MAU Provider Note (Signed)
History   44 yo G0 presented unannounced c/o RLQ pain since 6/17, beginning in vagina, then extending to RLQ and right flank.  Reports pain is constant, worse when putting pressure on right leg.  Mild nausea this am, no vomiting, some diminished appetite.  Denies dysuria or frequency, vaginal d/c, general abdominal pain, fever, constipation/diarrhea, HA, seizures, chest pain, or any other sx.  Is sexually active, denies dyspareunia or risk of STDs.  Reports not taking her BP meds in last few days and admits to no dietary management of her diabetes.  D/C'd 6/2 from [facility] s/p w/u for chest pain, after 24 hour stay--d/c information: 44 year old female with history of PE on anticoagulation, syncopal episodes, DOB, asthma, essential hypertension, history of DVT and hypercoagulable state, uncontrolled type 2 diabetes mellitus, anemia, depression, neuromuscular disorder and history of stroke, migraine disorder who presented to the ED with chest pain since one day. Patient had a stress test done by her cardiologist in [place] 1 week back and was told to be normal. Patient reports waking up with left-sided chest pain in her left arm with tingling and a knot formation in the left forearm. Vitals in the ED showed elevated blood pressure. CT angiogram of the chest was done to rule out PE and was negative. Ultrasound of the left arm was done which is negative for DVT. Patient also has history of recurrent transient loss of consciousness since 2009 and has been evaluated aggressively by cardiology as outpatient. Also has history of? Seizure-like events and is on Keppra and Topamax and follows with outpatient neurology.  Was given Percocet upon d/c from hospital, but hasn't taken any for this pain.  Patient Active Problem List   Diagnosis Date Noted  . Right flank pain 05/30/2015  . Seizures 05/07/2014  . Incontinence 02/13/2014  . S/P vaginal hysterectomy 01/29/2013  . TIA (transient ischemic attack)  08/03/2012  . Syncope 08/03/2012  . Left-sided weakness 08/03/2012  . Hypokalemia 08/03/2012  . Chest pain 08/03/2012  . Pulmonary embolism 05/18/2012  . Patellar tendinitis of left knee 08/21/2011  . Knee pain, left 07/07/2011  . PRURITUS 07/04/2010  . Syncope and collapse 10/18/2009  . ANEMIA 07/10/2008  . Essential hypertension 07/08/2008  . DM w/o complication type II 10/62/6948  . NEUROPATHY 01/22/2008  . Asthma 01/22/2008    Chief Complaint  Patient presents with  . Abdominal Pain   HPI:  See above  OB History    Gravida Para Term Preterm AB TAB SAB Ectopic Multiple Living   0 0              Past Medical History  Diagnosis Date  . PE (pulmonary embolism)     2009 - on oral contraception; multiple  . Syncopal episodes     unknown etiology  . Dysfunctional uterine bleeding     Seen by Dr. Leo Grosser, GYN; pending hysterectomy as of 07/2012  . Asthma   . Obesity   . Mastodynia   . Hypertension   . Post - coital bleeding 2012  . Labial lesion 2013  . BV (bacterial vaginosis) 2012  . Oligomenorrhea 2011  . DVT (deep venous thrombosis) 2011    pt had IUD; also 05/2012  . H/O hypercoagulable state     2/2 contraception  . HLD (hyperlipidemia)   . DM (diabetes mellitus)     diagnosed 2009  . Anemia   . Herpes simplex without mention of complication 04/4626    HSV-2  . Thyroid disease  hypothyroidism (pt denies)  . Vitamin D deficiency     unknown to pt.  . Major depression     Hx suicide attempt in 2009, [facility] admissions  . Venous insufficiency   . Sleep apnea   . Neuropathy   . Stroke     TIA 2013  . Headache(784.0)     migraines  . Neuromuscular disorder     neuropathy  . Arthritis     Left leg  . Status post placement of implantable loop recorder   . Complication of anesthesia     Seizures in PACU after surgery 3/15  . Seizures 2015    last episode May 2015  . Infection     UTI  . Vaginal Pap smear, abnormal   . SORE THROAT 08/10/2010     Qualifier: Diagnosis of  By: Vanessa Kick MD, Saralyn Pilar      Past Surgical History  Procedure Laterality Date  . Breast reduction surgery      Dr. Eugene Garnet [facility] 2011  . Cholecystectomy    . Cardiac electrophysiology study & dft      Implantable Loop recorder; no arrhythmias associated with synocpal spells; loop explanted 07-2013  . Endometrial biopsy  2011  . Dilatation & currettage/hysteroscopy with resectocope  2007 & 2013  . Laparoscopic assisted vaginal hysterectomy  12/18/2012    Procedure: LAPAROSCOPIC ASSISTED VAGINAL HYSTERECTOMY;  Surgeon: Eldred Manges, MD;  Location: [facility];  Service: Gynecology;  Laterality: N/A;  . Iud removal  12/18/2012    Procedure: INTRAUTERINE DEVICE (IUD) REMOVAL;  Surgeon: Eldred Manges, MD;  Location: [facility];  Service: Gynecology;  Laterality: N/A;  Removed during prep by E. Florene Glen PA  . Bilateral salpingectomy  12/18/2012    Procedure: BILATERAL SALPINGECTOMY;  Surgeon: Eldred Manges, MD;  Location: [facility];  Service: Gynecology;  Laterality: Bilateral;  . Abdominal hysterectomy    . Bladder suspension N/A 02/13/2014    Procedure: TRANSVAGINAL TAPE (TVT) PROCEDURE;  Surgeon: Delice Lesch, MD;  Location: [facility];  Service: Gynecology;  Laterality: N/A;  . Wisdom tooth extraction    . Transvaginal tape (tvt) removal N/A 04/28/2014    Procedure: Removal of suburethral mesh;  Surgeon: Delice Lesch, MD;  Location: [facility];  Service: Gynecology;  Laterality: N/A;  . Loop recorder explant N/A 07/17/2013    Procedure: LOOP RECORDER EXPLANT;  Surgeon: Thompson Grayer, MD;  Location: [facility] CATH LAB;  Service: Cardiovascular;  Laterality: N/A;    Family History  Problem Relation Age of Onset  . Melanoma Father 52  . Diabetes Father   . Hypertension Father   . Kidney disease Father   . Ulcerative colitis Mother 46  . Diabetes Mother   . Hypertension Mother   . Breast cancer Paternal Grandmother   . Cancer Paternal Grandmother   . Diabetes type II Maternal  Grandmother     deceased 10  . Diabetes Maternal Grandmother   . Pulmonary embolism Paternal Grandfather   . Stroke Maternal Grandfather     History  Substance Use Topics  . Smoking status: Never Smoker   . Smokeless tobacco: Never Used  . Alcohol Use: Yes     Comment: occasional    Allergies:  Allergies  Allergen Reactions  . Codeine Nausea And Vomiting    Pt takes percocet without problems   . Darvocet [Propoxyphene N-Acetaminophen] Nausea And Vomiting    vomiting  . Hydrocodone-Acetaminophen Nausea And Vomiting    Vomiting   . Latex Rash  rash  . Tramadol Nausea Only    No prescriptions prior to admission    ROS:  Right lower quadrant pain, flank pain, previous nausea, diminished appetite Physical Exam   Blood pressure 131/82, pulse 72, temperature 97.4 F (36.3 C), temperature source Oral, resp. rate 16, height 5' (1.524 m), weight 73.483 kg (162 lb), last menstrual period 12/19/2011, SpO2 99 %.  Filed Vitals:   05/30/15 1320 05/30/15 1334 05/30/15 1620 05/30/15 1658  BP: 154/97 160/98 123/91 131/82  Pulse: 79 87 84 72  Temp: 97.4 F (36.3 C)     TempSrc: Oral     Resp: 17  16 16   Height: 5' (1.524 m)     Weight: 73.483 kg (162 lb)     SpO2: 99%       Physical Exam  In NAD Chest clear Heart RRR without murmur Abd--mild pain and rebound in RLQ, non-distended, + bowel sounds.  Pain on palpation extending around right flank, with pain right above pelvic brim. Pelvic--small amount thick white d/c.  No vaginal tenderness or pain at vaginal cuff.  Right adnexa mildly tender, left WNL Ext WNL--Negative edema, negative Homan's  ED Course  Assessment: RLQ and right flank pain Hx vag hysterectomy, hx cholecystectomy Multiple co-morbidities--HTN, DM, hx PE, hx stroke, asthma, seizures Poor compliance with medication and diet regimens  Plan: Consulted with Dr. Mancel Bale CBC, CMP UA, urine culture Pelvic US Percocet po--elected to avoid Toradol, due to  patient on Xarelto, per consult with pharmacy.   Donnel Saxon CNM, MSN 05/30/2015 6:09 PM  Addendum: Patient felt much better after po Percocet. Results for orders placed or performed during the hospital encounter of 05/30/15 (from the past 24 hour(s))  Urinalysis, Routine w reflex microscopic (not at [facility])     Status: Abnormal   Collection Time: 05/30/15  2:10 PM  Result Value Ref Range   Color, Urine YELLOW YELLOW   APPearance CLEAR CLEAR   Specific Gravity, Urine <1.005 (L) 1.005 - 1.030   pH 5.0 5.0 - 8.0   Glucose, UA >1000 (A) NEGATIVE mg/dL   Hgb urine dipstick NEGATIVE NEGATIVE   Bilirubin Urine NEGATIVE NEGATIVE   Ketones, ur NEGATIVE NEGATIVE mg/dL   Protein, ur NEGATIVE NEGATIVE mg/dL   Urobilinogen, UA 0.2 0.0 - 1.0 mg/dL   Nitrite NEGATIVE NEGATIVE   Leukocytes, UA NEGATIVE NEGATIVE  Urine microscopic-add on     Status: Abnormal   Collection Time: 05/30/15  2:10 PM  Result Value Ref Range   Squamous Epithelial / LPF FEW (A) RARE   WBC, UA 0-2 <3 WBC/hpf   Bacteria, UA RARE RARE  Wet prep, genital     Status: Abnormal   Collection Time: 05/30/15  2:30 PM  Result Value Ref Range   Yeast Wet Prep HPF POC NONE SEEN NONE SEEN   Trich, Wet Prep NONE SEEN NONE SEEN   Clue Cells Wet Prep HPF POC FEW (A) NONE SEEN   WBC, Wet Prep HPF POC FEW (A) NONE SEEN  CBC with Differential/Platelet     Status: Abnormal   Collection Time: 05/30/15  2:53 PM  Result Value Ref Range   WBC 10.5 4.0 - 10.5 K/uL   RBC 5.41 (H) 3.87 - 5.11 MIL/uL   Hemoglobin 14.1 12.0 - 15.0 g/dL   HCT 41.9 36.0 - 46.0 %   MCV 77.4 (L) 78.0 - 100.0 fL   MCH 26.1 26.0 - 34.0 pg   MCHC 33.7 30.0 - 36.0 g/dL   RDW 14.8 11.5 -  15.5 %   Platelets 350 150 - 400 K/uL   Neutrophils Relative % 53 43 - 77 %   Neutro Abs 5.6 1.7 - 7.7 K/uL   Lymphocytes Relative 40 12 - 46 %   Lymphs Abs 4.2 (H) 0.7 - 4.0 K/uL   Monocytes Relative 6 3 - 12 %   Monocytes Absolute 0.6 0.1 - 1.0 K/uL   Eosinophils Relative  1 0 - 5 %   Eosinophils Absolute 0.1 0.0 - 0.7 K/uL   Basophils Relative 0 0 - 1 %   Basophils Absolute 0.0 0.0 - 0.1 K/uL  Comprehensive metabolic panel     Status: Abnormal   Collection Time: 05/30/15  2:53 PM  Result Value Ref Range   Sodium 133 (L) 135 - 145 mmol/L   Potassium 3.8 3.5 - 5.1 mmol/L   Chloride 99 (L) 101 - 111 mmol/L   CO2 26 22 - 32 mmol/L   Glucose, Bld 228 (H) 65 - 99 mg/dL   BUN 11 6 - 20 mg/dL   Creatinine, Ser 0.72 0.44 - 1.00 mg/dL   Calcium 9.4 8.9 - 10.3 mg/dL   Total Protein 7.6 6.5 - 8.1 g/dL   Albumin 3.9 3.5 - 5.0 g/dL   AST 19 15 - 41 U/L   ALT 26 14 - 54 U/L   Alkaline Phosphatase 72 38 - 126 U/L   Total Bilirubin 0.4 0.3 - 1.2 mg/dL   GFR calc non Af Amer >60 >60 mL/min   GFR calc Af Amer >60 >60 mL/min   Anion gap 8 5 - 15   I was involved in a delivery, so the patient was counseled regarding no significant findings, other than mild BV, and no clear etiology of the pain. Recommended she f/u at [place] with Dr. Leo Grosser or Dr. Mancel Bale (her choice).  I will request office call patient tomorrow to set up f/u appt.  Recommended she take the Percocet she has at home as needed for pain.  Donnel Saxon, CNM 05/30/15 4:50p

## 2015-05-30 NOTE — Discharge Instructions (Signed)
The office will call you tomorrow with follow up.   Take your Percocet as you have been instructed.  Abdominal Pain Many things can cause belly (abdominal) pain. Most times, the belly pain is not dangerous. Many cases of belly pain can be watched and treated at home. HOME CARE   Do not take medicines that help you go poop (laxatives) unless told to by your doctor.  Only take medicine as told by your doctor.  Eat or drink as told by your doctor. Your doctor will tell you if you should be on a special diet. GET HELP IF:  You do not know what is causing your belly pain.  You have belly pain while you are sick to your stomach (nauseous) or have runny poop (diarrhea).  You have pain while you pee or poop.  Your belly pain wakes you up at night.  You have belly pain that gets worse or better when you eat.  You have belly pain that gets worse when you eat fatty foods.  You have a fever. GET HELP RIGHT AWAY IF:   The pain does not go away within 2 hours.  You keep throwing up (vomiting).  The pain changes and is only in the right or left part of the belly.  You have bloody or tarry looking poop. MAKE SURE YOU:   Understand these instructions.  Will watch your condition.  Will get help right away if you are not doing well or get worse. Document Released: 05/15/2008 Document Revised: 12/02/2013 Document Reviewed: 08/06/2013 [facility] Patient Information 2015 [place], [place]. This information is not intended to replace advice given to you by your health care provider. Make sure you discuss any questions you have with your health care provider.

## 2015-05-30 NOTE — MAU Note (Signed)
Pain started on Friday, was up in vagina, then spread around to RLQ, no pain is on rt side.

## 2015-06-01 LAB — URINE CULTURE: Culture: 70000

## 2015-06-03 ENCOUNTER — Other Ambulatory Visit: Payer: Self-pay | Admitting: Obstetrics and Gynecology

## 2015-06-03 DIAGNOSIS — R102 Pelvic and perineal pain: Secondary | ICD-10-CM

## 2015-06-08 DIAGNOSIS — R102 Pelvic and perineal pain unspecified side: Secondary | ICD-10-CM | POA: Insufficient documentation

## 2015-06-09 ENCOUNTER — Other Ambulatory Visit: Payer: Medicare Other

## 2015-07-13 DIAGNOSIS — G40909 Epilepsy, unspecified, not intractable, without status epilepticus: Secondary | ICD-10-CM | POA: Insufficient documentation

## 2015-08-02 DIAGNOSIS — R197 Diarrhea, unspecified: Secondary | ICD-10-CM | POA: Insufficient documentation

## 2015-08-02 DIAGNOSIS — R195 Other fecal abnormalities: Secondary | ICD-10-CM | POA: Insufficient documentation

## 2015-08-30 DIAGNOSIS — I6990 Unspecified sequelae of unspecified cerebrovascular disease: Secondary | ICD-10-CM | POA: Insufficient documentation

## 2015-10-20 DIAGNOSIS — J0140 Acute pansinusitis, unspecified: Secondary | ICD-10-CM | POA: Insufficient documentation

## 2015-10-20 DIAGNOSIS — B373 Candidiasis of vulva and vagina: Secondary | ICD-10-CM | POA: Insufficient documentation

## 2015-10-20 DIAGNOSIS — B3731 Acute candidiasis of vulva and vagina: Secondary | ICD-10-CM | POA: Insufficient documentation

## 2015-10-27 ENCOUNTER — Telehealth: Payer: Self-pay | Admitting: Internal Medicine

## 2015-10-27 NOTE — Telephone Encounter (Signed)
[place] records received on patient placed in chart prep bin for 11/22/15 appt

## 2015-11-06 ENCOUNTER — Encounter (HOSPITAL_COMMUNITY): Payer: Self-pay | Admitting: Emergency Medicine

## 2015-11-06 ENCOUNTER — Emergency Department (HOSPITAL_COMMUNITY)
Admission: EM | Admit: 2015-11-06 | Discharge: 2015-11-06 | Disposition: A | Discharge status: Home / Self Care | Payer: Medicare Other | Attending: Emergency Medicine | Admitting: Emergency Medicine

## 2015-11-06 ENCOUNTER — Emergency Department (HOSPITAL_COMMUNITY): Payer: Medicare Other

## 2015-11-06 DIAGNOSIS — E785 Hyperlipidemia, unspecified: Secondary | ICD-10-CM | POA: Diagnosis not present

## 2015-11-06 DIAGNOSIS — R000 Tachycardia, unspecified: Secondary | ICD-10-CM | POA: Diagnosis not present

## 2015-11-06 DIAGNOSIS — M1990 Unspecified osteoarthritis, unspecified site: Secondary | ICD-10-CM | POA: Diagnosis not present

## 2015-11-06 DIAGNOSIS — Z915 Personal history of self-harm: Secondary | ICD-10-CM | POA: Diagnosis not present

## 2015-11-06 DIAGNOSIS — Z7951 Long term (current) use of inhaled steroids: Secondary | ICD-10-CM | POA: Insufficient documentation

## 2015-11-06 DIAGNOSIS — Z8742 Personal history of other diseases of the female genital tract: Secondary | ICD-10-CM | POA: Insufficient documentation

## 2015-11-06 DIAGNOSIS — Z79899 Other long term (current) drug therapy: Secondary | ICD-10-CM | POA: Diagnosis not present

## 2015-11-06 DIAGNOSIS — E119 Type 2 diabetes mellitus without complications: Secondary | ICD-10-CM | POA: Insufficient documentation

## 2015-11-06 DIAGNOSIS — R0781 Pleurodynia: Secondary | ICD-10-CM

## 2015-11-06 DIAGNOSIS — E669 Obesity, unspecified: Secondary | ICD-10-CM | POA: Insufficient documentation

## 2015-11-06 DIAGNOSIS — R079 Chest pain, unspecified: Secondary | ICD-10-CM | POA: Diagnosis present

## 2015-11-06 DIAGNOSIS — Z8619 Personal history of other infectious and parasitic diseases: Secondary | ICD-10-CM | POA: Insufficient documentation

## 2015-11-06 DIAGNOSIS — J45909 Unspecified asthma, uncomplicated: Secondary | ICD-10-CM | POA: Diagnosis not present

## 2015-11-06 DIAGNOSIS — Z7901 Long term (current) use of anticoagulants: Secondary | ICD-10-CM | POA: Diagnosis not present

## 2015-11-06 DIAGNOSIS — Z86718 Personal history of other venous thrombosis and embolism: Secondary | ICD-10-CM | POA: Diagnosis not present

## 2015-11-06 DIAGNOSIS — Z91040 Latex allergy status: Secondary | ICD-10-CM | POA: Insufficient documentation

## 2015-11-06 DIAGNOSIS — F329 Major depressive disorder, single episode, unspecified: Secondary | ICD-10-CM | POA: Insufficient documentation

## 2015-11-06 DIAGNOSIS — I10 Essential (primary) hypertension: Secondary | ICD-10-CM | POA: Insufficient documentation

## 2015-11-06 DIAGNOSIS — Z86711 Personal history of pulmonary embolism: Secondary | ICD-10-CM | POA: Diagnosis not present

## 2015-11-06 DIAGNOSIS — Z87448 Personal history of other diseases of urinary system: Secondary | ICD-10-CM | POA: Diagnosis not present

## 2015-11-06 DIAGNOSIS — Z862 Personal history of diseases of the blood and blood-forming organs and certain disorders involving the immune mechanism: Secondary | ICD-10-CM | POA: Insufficient documentation

## 2015-11-06 DIAGNOSIS — Z794 Long term (current) use of insulin: Secondary | ICD-10-CM | POA: Insufficient documentation

## 2015-11-06 DIAGNOSIS — Z8673 Personal history of transient ischemic attack (TIA), and cerebral infarction without residual deficits: Secondary | ICD-10-CM | POA: Insufficient documentation

## 2015-11-06 DIAGNOSIS — R0789 Other chest pain: Secondary | ICD-10-CM | POA: Insufficient documentation

## 2015-11-06 DIAGNOSIS — Z87440 Personal history of urinary (tract) infections: Secondary | ICD-10-CM | POA: Diagnosis not present

## 2015-11-06 DIAGNOSIS — G43909 Migraine, unspecified, not intractable, without status migrainosus: Secondary | ICD-10-CM | POA: Insufficient documentation

## 2015-11-06 LAB — CBC
HCT: 39.7 % (ref 36.0–46.0)
HEMOGLOBIN: 13.3 g/dL (ref 12.0–15.0)
MCH: 26 pg (ref 26.0–34.0)
MCHC: 33.5 g/dL (ref 30.0–36.0)
MCV: 77.5 fL — ABNORMAL LOW (ref 78.0–100.0)
PLATELETS: 312 10*3/uL (ref 150–400)
RBC: 5.12 MIL/uL — AB (ref 3.87–5.11)
RDW: 15.1 % (ref 11.5–15.5)
WBC: 11.6 10*3/uL — ABNORMAL HIGH (ref 4.0–10.5)

## 2015-11-06 LAB — BASIC METABOLIC PANEL
Anion gap: 8 (ref 5–15)
BUN: 9 mg/dL (ref 6–20)
CALCIUM: 9.1 mg/dL (ref 8.9–10.3)
CO2: 24 mmol/L (ref 22–32)
CREATININE: 0.61 mg/dL (ref 0.44–1.00)
Chloride: 102 mmol/L (ref 101–111)
Glucose, Bld: 265 mg/dL — ABNORMAL HIGH (ref 65–99)
Potassium: 3.5 mmol/L (ref 3.5–5.1)
Sodium: 134 mmol/L — ABNORMAL LOW (ref 135–145)

## 2015-11-06 LAB — I-STAT TROPONIN, ED: TROPONIN I, POC: 0 ng/mL (ref 0.00–0.08)

## 2015-11-06 MED ORDER — NAPROXEN 500 MG PO TABS
500.0000 mg | ORAL_TABLET | Freq: Once | ORAL | Status: AC
  Administered 2015-11-06: 500 mg via ORAL
  Filled 2015-11-06: qty 1

## 2015-11-06 MED ORDER — AMLODIPINE BESYLATE 10 MG PO TABS
10.0000 mg | ORAL_TABLET | Freq: Every day | ORAL | Status: DC
  Filled 2015-11-06 (×2): qty 1

## 2015-11-06 MED ORDER — NAPROXEN 375 MG PO TABS: 375.0000 mg | ORAL_TABLET | Freq: Two times a day (BID) | ORAL | Status: DC

## 2015-11-06 NOTE — ED Notes (Signed)
Patient c/o central chest pain, onset 30 minutes ago. Patient states she has a history of this same pain, dx as chest wall inflammation in the past. Patient with hx of HTN as well, BP 204/132, repeat 205/112 in right arm. Patient states she is taking her home HTN meds. Patient has not had aspirin.

## 2015-11-06 NOTE — Discharge Instructions (Signed)
We saw you in the ER for the chest pain/shortness of breath. All of our cardiac workup is normal, including labs, EKG and chest X-RAY are normal. We are not sure what is causing your discomfort, but we feel comfortable sending you home at this time.  We think the pain is likely chest wall or from the lung. Lung clot in the setting of you taking xarelto and normal CT PE the last 4 visit is considered less likely. That being said. Please return to the ER if you have worsening chest pain, shortness of breath, fainting, bloody phlegm. Otherwise see your primary care doctor as requested.   Costochondritis Costochondritis, sometimes called Tietze syndrome, is a swelling and irritation (inflammation) of the tissue (cartilage) that connects your ribs with your breastbone (sternum). It causes pain in the chest and rib area. Costochondritis usually goes away on its own over time. It can take up to 6 weeks or longer to get better, especially if you are unable to limit your activities. CAUSES  Some cases of costochondritis have no known cause. Possible causes include:  Injury (trauma).  Exercise or activity such as lifting.  Severe coughing. SIGNS AND SYMPTOMS  Pain and tenderness in the chest and rib area.  Pain that gets worse when coughing or taking deep breaths.  Pain that gets worse with specific movements. DIAGNOSIS  Your health care provider will do a physical exam and ask about your symptoms. Chest X-rays or other tests may be done to rule out other problems. TREATMENT  Costochondritis usually goes away on its own over time. Your health care provider may prescribe medicine to help relieve pain. HOME CARE INSTRUCTIONS   Avoid exhausting physical activity. Try not to strain your ribs during normal activity. This would include any activities using chest, abdominal, and side muscles, especially if heavy weights are used.  Apply ice to the affected area for the first 2 days after the pain  begins.  Put ice in a plastic bag.  Place a towel between your skin and the bag.  Leave the ice on for 20 minutes, 2-3 times a day.  Only take over-the-counter or prescription medicines as directed by your health care provider. SEEK MEDICAL CARE IF:  You have redness or swelling at the rib joints. These are signs of infection.  Your pain does not go away despite rest or medicine. SEEK IMMEDIATE MEDICAL CARE IF:   Your pain increases or you are very uncomfortable.  You have shortness of breath or difficulty breathing.  You cough up blood.  You have worse chest pains, sweating, or vomiting.  You have a fever or persistent symptoms for more than 2-3 days.  You have a fever and your symptoms suddenly get worse. MAKE SURE YOU:   Understand these instructions.  Will watch your condition.  Will get help right away if you are not doing well or get worse.   This information is not intended to replace advice given to you by your health care provider. Make sure you discuss any questions you have with your health care provider.   Document Released: 09/06/2005 Document Revised: 09/17/2013 Document Reviewed: 07/01/2013 Elsevier Interactive Patient Education 2016 [employer]  Nonspecific Chest Pain  Chest pain can be caused by many different conditions. There is always a chance that your pain could be related to something serious, such as a heart attack or a blood clot in your lungs. Chest pain can also be caused by conditions that are not life-threatening. If you  have chest pain, it is very important to follow up with your health care provider. CAUSES  Chest pain can be caused by:  Heartburn.  Pneumonia or bronchitis.  Anxiety or stress.  Inflammation around your heart (pericarditis) or lung (pleuritis or pleurisy).  A blood clot in your lung.  A collapsed lung (pneumothorax). It can develop suddenly on its own (spontaneous pneumothorax) or from trauma to the  chest.  Shingles infection (varicella-zoster virus).  Heart attack.  Damage to the bones, muscles, and cartilage that make up your chest wall. This can include:  Bruised bones due to injury.  Strained muscles or cartilage due to frequent or repeated coughing or overwork.  Fracture to one or more ribs.  Sore cartilage due to inflammation (costochondritis). RISK FACTORS  Risk factors for chest pain may include:  Activities that increase your risk for trauma or injury to your chest.  Respiratory infections or conditions that cause frequent coughing.  Medical conditions or overeating that can cause heartburn.  Heart disease or family history of heart disease.  Conditions or health behaviors that increase your risk of developing a blood clot.  Having had chicken pox (varicella zoster). SIGNS AND SYMPTOMS Chest pain can feel like:  Burning or tingling on the surface of your chest or deep in your chest.  Crushing, pressure, aching, or squeezing pain.  Dull or sharp pain that is worse when you move, cough, or take a deep breath.  Pain that is also felt in your back, neck, shoulder, or arm, or pain that spreads to any of these areas. Your chest pain may come and go, or it may stay constant. DIAGNOSIS Lab tests or other studies may be needed to find the cause of your pain. Your health care provider may have you take a test called an ambulatory ECG (electrocardiogram). An ECG records your heartbeat patterns at the time the test is performed. You may also have other tests, such as:  Transthoracic echocardiogram (TTE). During echocardiography, sound waves are used to create a picture of all of the heart structures and to look at how blood flows through your heart.  Transesophageal echocardiogram (TEE).This is a more advanced imaging test that obtains images from inside your body. It allows your health care provider to see your heart in finer detail.  Cardiac monitoring. This allows  your health care provider to monitor your heart rate and rhythm in real time.  Holter monitor. This is a portable device that records your heartbeat and can help to diagnose abnormal heartbeats. It allows your health care provider to track your heart activity for several days, if needed.  Stress tests. These can be done through exercise or by taking medicine that makes your heart beat more quickly.  Blood tests.  Imaging tests. TREATMENT  Your treatment depends on what is causing your chest pain. Treatment may include:  Medicines. These may include:  Acid blockers for heartburn.  Anti-inflammatory medicine.  Pain medicine for inflammatory conditions.  Antibiotic medicine, if an infection is present.  Medicines to dissolve blood clots.  Medicines to treat coronary artery disease.  Supportive care for conditions that do not require medicines. This may include:  Resting.  Applying heat or cold packs to injured areas.  Limiting activities until pain decreases. HOME CARE INSTRUCTIONS  If you were prescribed an antibiotic medicine, finish it all even if you start to feel better.  Avoid any activities that bring on chest pain.  Do not use any tobacco products, including cigarettes, chewing  tobacco, or electronic cigarettes. If you need help quitting, ask your health care provider.  Do not drink alcohol.  Take medicines only as directed by your health care provider.  Keep all follow-up visits as directed by your health care provider. This is important. This includes any further testing if your chest pain does not go away.  If heartburn is the cause for your chest pain, you may be told to keep your head raised (elevated) while sleeping. This reduces the chance that acid will go from your stomach into your esophagus.  Make lifestyle changes as directed by your health care provider. These may include:  Getting regular exercise. Ask your health care provider to suggest some  activities that are safe for you.  Eating a heart-healthy diet. A registered dietitian can help you to learn healthy eating options.  Maintaining a healthy weight.  Managing diabetes, if necessary.  Reducing stress. SEEK MEDICAL CARE IF:  Your chest pain does not go away after treatment.  You have a rash with blisters on your chest.  You have a fever. SEEK IMMEDIATE MEDICAL CARE IF:   Your chest pain is worse.  You have an increasing cough, or you cough up blood.  You have severe abdominal pain.  You have severe weakness.  You faint.  You have chills.  You have sudden, unexplained chest discomfort.  You have sudden, unexplained discomfort in your arms, back, neck, or jaw.  You have shortness of breath at any time.  You suddenly start to sweat, or your skin gets clammy.  You feel nauseous or you vomit.  You suddenly feel light-headed or dizzy.  Your heart begins to beat quickly, or it feels like it is skipping beats. These symptoms may represent a serious problem that is an emergency. Do not wait to see if the symptoms will go away. Get medical help right away. Call your local emergency services (911 in the [place] Do not drive yourself to the hospital.   This information is not intended to replace advice given to you by your health care provider. Make sure you discuss any questions you have with your health care provider.   Document Released: 09/06/2005 Document Revised: 12/18/2014 Document Reviewed: 07/03/2014 Elsevier Interactive Patient Education [employer].

## 2015-11-06 NOTE — ED Provider Notes (Signed)
CSN: [id number]     Arrival date & time 11/06/15  0006 History  By signing my name below, I, Jolayne Panther, attest that this documentation has been prepared under the direction and in the presence of Varney Biles, MD. Electronically Signed: Jolayne Panther, Scribe. 11/06/2015. 1:02 AM.    Chief Complaint  Patient presents with  . Chest Pain    central    The history is provided by the patient. No language interpreter was used.    HPI Comments: Makayla Frazier is a 44 y.o. female who presents to the Emergency Department complaining of mild intermittant right upper chest pain onset one hour ago. Pt reports she was just sitting when the pain started and reports that she has had similar pain before. She reports pain specifically when she takes deep breaths and reports associated SOB and nausea. Pt denies cough, fever, chills and vomiting. Pt has a Hx of DM, HTN, and blood clots in her lungs and legs. She is currently on Xarelto for her HTN which she was diagnosed with in 2009. She reports she is compliant with her medications and also reports that she used to see a hematologist but she stopped going after her surgery because she was never given a follow-up appointment. Pt has no had any recent surgeries in the last six weeks. Pt has not taken her lisinopril yet tonight. Pt denies Hx of smoking and wheezing.  Vitals  Heart Rate: 103 BP: 160/90 Resp: 20 SpO2:  99%   Past Medical History  Diagnosis Date  . PE (pulmonary embolism)     2009 - on oral contraception; multiple  . Syncopal episodes     unknown etiology  . Dysfunctional uterine bleeding     Seen by Dr. Leo Grosser, GYN; pending hysterectomy as of 07/2012  . Asthma   . Obesity   . Mastodynia   . Hypertension   . Post - coital bleeding 2012  . Labial lesion 2013  . BV (bacterial vaginosis) 2012  . Oligomenorrhea 2011  . DVT (deep venous thrombosis) ([place]) 2011    pt had IUD; also 05/2012  . H/O hypercoagulable state      2/2 contraception  . HLD (hyperlipidemia)   . DM (diabetes mellitus) ([place])     diagnosed 2009  . Anemia   . Herpes simplex without mention of complication 0000000    HSV-2  . Thyroid disease     hypothyroidism (pt denies)  . Vitamin D deficiency     unknown to pt.  . Major depression ([place])     Hx suicide attempt in 2009, [facility] admissions  . Venous insufficiency   . Sleep apnea   . Neuropathy ([place])   . Stroke [facility])     TIA 2013  . Headache(784.0)     migraines  . Neuromuscular disorder ([facility])     neuropathy  . Arthritis     Left leg  . Status post placement of implantable loop recorder   . Complication of anesthesia     Seizures in PACU after surgery 3/15  . Seizures ([place]) 2015    last episode May 2015  . Infection     UTI  . Vaginal Pap smear, abnormal   . SORE THROAT 08/10/2010    Qualifier: Diagnosis of  By: Vanessa Kick MD, Saralyn Pilar     Past Surgical History  Procedure Laterality Date  . Breast reduction surgery      Dr. Eugene Garnet [facility] 2011  . Cholecystectomy    .  Cardiac electrophysiology study & dft      Implantable Loop recorder; no arrhythmias associated with synocpal spells; loop explanted 07-2013  . Endometrial biopsy  2011  . Dilatation & currettage/hysteroscopy with resectocope  2007 & 2013  . Laparoscopic assisted vaginal hysterectomy  12/18/2012    Procedure: LAPAROSCOPIC ASSISTED VAGINAL HYSTERECTOMY;  Surgeon: Eldred Manges, MD;  Location: [facility];  Service: Gynecology;  Laterality: N/A;  . Iud removal  12/18/2012    Procedure: INTRAUTERINE DEVICE (IUD) REMOVAL;  Surgeon: Eldred Manges, MD;  Location: [place] [facility];  Service: Gynecology;  Laterality: N/A;  Removed during prep by E. Florene Glen PA  . Bilateral salpingectomy  12/18/2012    Procedure: BILATERAL SALPINGECTOMY;  Surgeon: Eldred Manges, MD;  Location: [facility];  Service: Gynecology;  Laterality: Bilateral;  . Bladder suspension N/A 02/13/2014    Procedure: TRANSVAGINAL TAPE (TVT) PROCEDURE;  Surgeon: Delice Lesch, MD;  Location: [place];  Service: Gynecology;  Laterality: N/A;  . Wisdom tooth extraction    . Transvaginal tape (tvt) removal N/A 04/28/2014    Procedure: Removal of suburethral mesh;  Surgeon: Delice Lesch, MD;  Location: [place] [facility];  Service: Gynecology;  Laterality: N/A;  . Loop recorder explant N/A 07/17/2013    Procedure: LOOP RECORDER EXPLANT;  Surgeon: Thompson Grayer, MD;  Location: [facility] CATH LAB;  Service: Cardiovascular;  Laterality: N/A;   Family History  Problem Relation Age of Onset  . Melanoma Father 96  . Diabetes Father   . Hypertension Father   . Kidney disease Father   . Ulcerative colitis Mother 29  . Diabetes Mother   . Hypertension Mother   . Breast cancer Paternal Grandmother   . Cancer Paternal Grandmother   . Diabetes type II Maternal Grandmother     deceased 68  . Diabetes Maternal Grandmother   . Pulmonary embolism Paternal Grandfather   . Stroke Maternal Grandfather    Social History  Substance Use Topics  . Smoking status: Never Smoker   . Smokeless tobacco: Never Used  . Alcohol Use: Yes     Comment: occasional   OB History    Gravida Para Term Preterm AB TAB SAB Ectopic Multiple Living   0 0             Review of Systems A complete 10 system review of systems was obtained and all systems are negative except as noted in the HPI and PMH.    Allergies  Codeine; Darvocet; Hydrocodone-acetaminophen; Latex; and Tramadol  Home Medications   Prior to Admission medications   Medication Sig Start Date End Date Taking? Authorizing Provider  acetaminophen (TYLENOL) 500 MG tablet Take 1,000 mg by mouth every 6 (six) hours as needed (for headache/pain.).   Yes Historical Provider, MD  albuterol (PROVENTIL HFA;VENTOLIN HFA) 108 (90 BASE) MCG/ACT inhaler Inhale 2 puffs into the lungs every 6 (six) hours as needed for wheezing or shortness of breath.   Yes Historical Provider, MD  amLODipine (NORVASC) 10 MG tablet Take 10 mg by mouth at bedtime.    Yes  Historical Provider, MD  atorvastatin (LIPITOR) 10 MG tablet Take 1 tablet by mouth daily. 01/08/14  Yes Historical Provider, MD  budesonide-formoterol (SYMBICORT) 80-4.5 MCG/ACT inhaler Inhale 2 puffs into the lungs daily.   Yes Historical Provider, MD  fluticasone (FLONASE) 50 MCG/ACT nasal spray Place 1 spray into both nostrils daily as needed (for nasal congestion/sinus infection).  10/18/15  Yes Historical Provider, MD  Insulin Degludec 200 UNIT/ML SOPN  Inject 50 Units into the skin at bedtime.   Yes Historical Provider, MD  INVOKAMET 150-500 MG TABS Take 1 tablet by mouth 2 (two) times daily.  09/25/14  Yes Historical Provider, MD  levETIRAcetam (KEPPRA) 500 MG tablet Take 500 mg by mouth 2 (two) times daily. 01/14/15 01/14/16 Yes Historical Provider, MD  lisinopril-hydrochlorothiazide (PRINZIDE,ZESTORETIC) 10-12.5 MG per tablet Take 1 tablet by mouth daily. 04/08/15  Yes Historical Provider, MD  loratadine (CLARITIN) 10 MG tablet Take 10 mg by mouth daily as needed for allergies.  08/04/13  Yes Historical Provider, MD  montelukast (SINGULAIR) 10 MG tablet Take 10 mg by mouth at bedtime.  04/13/15  Yes Historical Provider, MD  NOVOLOG FLEXPEN 100 UNIT/ML FlexPen Inject 14 Units into the skin 3 (three) times daily before meals. Takes if sugar > 200 01/08/14  Yes Historical Provider, MD  ondansetron (ZOFRAN) 4 MG tablet Take 1 tablet (4 mg total) by mouth every 6 (six) hours. 03/02/15  Yes Nicole Pisciotta, PA-C  Rivaroxaban (XARELTO) 20 MG TABS Take 20 mg by mouth at bedtime.    Yes Historical Provider, MD  rizatriptan (MAXALT-MLT) 5 MG disintegrating tablet Take 1 tablet (5 mg total) by mouth as needed for migraine. May repeat in 2 hours if needed 05/07/14  Yes Marcial Pacas, MD  naproxen (NAPROSYN) 375 MG tablet Take 1 tablet (375 mg total) by mouth 2 (two) times daily. 11/06/15   Ferne Ellingwood, MD   BP 150/97 mmHg  Pulse 85  Temp(Src) 97.8 F (36.6 C) (Oral)  Resp 19  SpO2 99%  LMP 12/19/2011 Physical  Exam  Constitutional: She is oriented to person, place, and time. She appears well-developed and well-nourished. No distress.  HENT:  Head: Normocephalic.  Eyes: Conjunctivae are normal.  Cardiovascular:  Tachycardic on cardiac exam 2+ and equal radial pulse bilaterally   Pulmonary/Chest: Effort normal and breath sounds normal.  Lungs clear to osciltation bilaterally  Abdominal: She exhibits no distension.  Neurological: She is alert and oriented to person, place, and time.  Skin: Skin is warm and dry.  Psychiatric: She has a normal mood and affect.  Nursing note and vitals reviewed.   ED Course  Procedures  DIAGNOSTIC STUDIES:    Oxygen Saturation is 99% on RA, normal by my interpretation.   COORDINATION OF CARE:  12:49 AM Will administer pt her night blood pressure medications in the ED. Discussed treatment plan with pt at bedside and pt agreed to plan.     Labs Review Labs Reviewed  BASIC METABOLIC PANEL - Abnormal; Notable for the following:    Sodium 134 (*)    Glucose, Bld 265 (*)    All other components within normal limits  CBC - Abnormal; Notable for the following:    WBC 11.6 (*)    RBC 5.12 (*)    MCV 77.5 (*)    All other components within normal limits  I-STAT TROPOININ, ED    Imaging Review Dg Chest 2 View  11/06/2015  CLINICAL DATA:  Right anterior chest pain with onset today. History of blood clots in the lungs. No fever. Shortness of breath on exertion. EXAM: CHEST  2 VIEW COMPARISON:  CT chest 05/12/2015.  Chest 05/12/2015. FINDINGS: The heart size and mediastinal contours are within normal limits. Both lungs are clear. The visualized skeletal structures are unremarkable. IMPRESSION: No active cardiopulmonary disease. Electronically Signed   By: Lucienne Capers M.D.   On: 11/06/2015 01:17   I have personally reviewed and evaluated these images  and lab results as part of my medical decision-making.   EKG Interpretation   Date/Time:  Saturday  November 06 2015 00:15:25 EST Ventricular Rate:  103 PR Interval:  146 QRS Duration: 72 QT Interval:  377 QTC Calculation: 493 R Axis:   34 Text Interpretation:  Sinus tachycardia Borderline T wave abnormalities  Borderline prolonged QT interval No significant change since last tracing  Confirmed by Kathrynn Humble, MD, Hurbert Duran [phone]) on 11/06/2015 12:41:51 AM      MDM   Final diagnoses:  Pleuritic chest pain  Chest wall pain   I personally performed the services described in this documentation, which was scribed in my presence. The recorded information has been reviewed and is accurate.  Pt comes in with cc of chest pain. Right sided pleuritic chest pain. Pain is not positional and not typical of ACS or pericarditis. EKG and trops are neg. There is no new cough. CXR is neg and the lung exam is clear.  Pt has hx of PE, she is on xarelto. With Xarelto she has had no recurrent PE. She has had 4 CT Scans in the last 2 years and they are all neg. We discussed the pro and cons of CT scan and my clinical pretest probability of her having PE as the cause of her chest pain - and we agreed to be conservative and treat her pain as a chest wall pain or pleurisy. She will return to the ER if her symptoms get worse - and the return precautions have been verbalized and written.   Varney Biles, MD 11/06/15 [phone]

## 2015-11-22 ENCOUNTER — Ambulatory Visit (INDEPENDENT_AMBULATORY_CARE_PROVIDER_SITE_OTHER): Payer: Medicare Other | Admitting: Internal Medicine

## 2015-11-22 ENCOUNTER — Encounter: Payer: Self-pay | Admitting: Internal Medicine

## 2015-11-22 VITALS — BP 152/106 | HR 88 | Ht 60.0 in | Wt 168.8 lb

## 2015-11-22 DIAGNOSIS — R55 Syncope and collapse: Secondary | ICD-10-CM

## 2015-11-22 DIAGNOSIS — I10 Essential (primary) hypertension: Secondary | ICD-10-CM | POA: Diagnosis not present

## 2015-11-22 DIAGNOSIS — I2782 Chronic pulmonary embolism: Secondary | ICD-10-CM | POA: Diagnosis not present

## 2015-11-22 MED ORDER — LISINOPRIL-HYDROCHLOROTHIAZIDE 20-12.5 MG PO TABS: 1.0000 | ORAL_TABLET | Freq: Every day | ORAL | Status: DC

## 2015-11-22 NOTE — Patient Instructions (Signed)
Medication Instructions:  Your physician has recommended you make the following change in your medication:  1) Increase Lisinopril to 20/12.5mg  daily   Labwork: None ordered   Testing/Procedures: None ordered   Follow-Up: You have been referred to Dr Oval Linsey for general cardiology follow up  Follow up with Dr Rayann Heman as needed   Any Other Special Instructions Will Be Listed Below (If Applicable).     If you need a refill on your cardiac medications before your next appointment, please call your pharmacy.

## 2015-11-22 NOTE — Progress Notes (Signed)
PCP:  Suzanna Obey, MD Primary Cardiologist:  Osborne Oman ([place])--> wishes to establish in [place]  The patient presents today for routine electrophysiology followup.   She says that with initiation of antiepileptic medicine that her seizures/ syncope have near resolved.  She has had several recent ED visits for atypical chest pain.  She was evaluated by [facility] in [place] and had a stress echo 05/03/15 which was normal.  Today, she denies symptoms of palpitations, exertional chest pain, shortness of breath, lower extremity edema, or other concerns.  Past Medical History  Diagnosis Date  . PE (pulmonary embolism)     2009 - on oral contraception; multiple  . Syncopal episodes     unknown etiology  . Dysfunctional uterine bleeding     Seen by Dr. Leo Grosser, GYN; pending hysterectomy as of 07/2012  . Asthma   . Obesity   . Mastodynia   . Hypertension   . Post - coital bleeding 2012  . Labial lesion 2013  . BV (bacterial vaginosis) 2012  . Oligomenorrhea 2011  . DVT (deep venous thrombosis) ([place]) 2011    pt had IUD; also 05/2012  . H/O hypercoagulable state     2/2 contraception  . HLD (hyperlipidemia)   . DM (diabetes mellitus) ([place])     diagnosed 2009  . Anemia   . Herpes simplex without mention of complication 0000000    HSV-2  . Thyroid disease     hypothyroidism (pt denies)  . Vitamin D deficiency     unknown to pt.  . Major depression ([place])     Hx suicide attempt in 2009, [facility] admissions  . Venous insufficiency   . Sleep apnea   . Neuropathy ([place])   . Stroke [facility])     TIA 2013  . Headache(784.0)     migraines  . Neuromuscular disorder (HCC)     neuropathy  . Arthritis     Left leg  . Status post placement of implantable loop recorder   . Complication of anesthesia     Seizures in PACU after surgery 3/15  . Seizures ([place]) 2015    last episode May 2015  . Infection     UTI  . Vaginal Pap smear, abnormal   . SORE THROAT 08/10/2010    Qualifier:  Diagnosis of  By: Vanessa Kick MD, Saralyn Pilar     Past Surgical History  Procedure Laterality Date  . Breast reduction surgery      Dr. Eugene Garnet [facility] 2011  . Cholecystectomy    . Cardiac electrophysiology study & dft      Implantable Loop recorder; no arrhythmias associated with synocpal spells; loop explanted 07-2013  . Endometrial biopsy  2011  . Dilatation & currettage/hysteroscopy with resectocope  2007 & 2013  . Laparoscopic assisted vaginal hysterectomy  12/18/2012    Procedure: LAPAROSCOPIC ASSISTED VAGINAL HYSTERECTOMY;  Surgeon: Eldred Manges, MD;  Location: [facility];  Service: Gynecology;  Laterality: N/A;  . Iud removal  12/18/2012    Procedure: INTRAUTERINE DEVICE (IUD) REMOVAL;  Surgeon: Eldred Manges, MD;  Location: [facility];  Service: Gynecology;  Laterality: N/A;  Removed during prep by E. Florene Glen PA  . Bilateral salpingectomy  12/18/2012    Procedure: BILATERAL SALPINGECTOMY;  Surgeon: Eldred Manges, MD;  Location: [facility];  Service: Gynecology;  Laterality: Bilateral;  . Bladder suspension N/A 02/13/2014    Procedure: TRANSVAGINAL TAPE (TVT) PROCEDURE;  Surgeon: Delice Lesch, MD;  Location: [facility];  Service: Gynecology;  Laterality: N/A;  .  Wisdom tooth extraction    . Transvaginal tape (tvt) removal N/A 04/28/2014    Procedure: Removal of suburethral mesh;  Surgeon: Delice Lesch, MD;  Location: [facility];  Service: Gynecology;  Laterality: N/A;  . Loop recorder explant N/A 07/17/2013    Procedure: LOOP RECORDER EXPLANT;  Surgeon: Thompson Grayer, MD;  Location: [facility] CATH LAB;  Service: Cardiovascular;  Laterality: N/A;    Current Outpatient Prescriptions  Medication Sig Dispense Refill  . acetaminophen (TYLENOL) 500 MG tablet Take 1,000 mg by mouth every 6 (six) hours as needed (for headache/pain.).    Marland Kitchen albuterol (PROVENTIL HFA;VENTOLIN HFA) 108 (90 BASE) MCG/ACT inhaler Inhale 2 puffs into the lungs every 6 (six) hours as needed for wheezing or shortness of breath.    Marland Kitchen  amLODipine (NORVASC) 10 MG tablet Take 10 mg by mouth at bedtime.     Marland Kitchen atorvastatin (LIPITOR) 10 MG tablet Take 1 tablet by mouth daily.    . budesonide-formoterol (SYMBICORT) 80-4.5 MCG/ACT inhaler Inhale 2 puffs into the lungs daily.    . fluticasone (FLONASE) 50 MCG/ACT nasal spray Place 1 spray into both nostrils daily as needed (for nasal congestion/sinus infection).     . Insulin Degludec 200 UNIT/ML SOPN Inject 50 Units into the skin at bedtime.    . INVOKAMET 150-500 MG TABS Take 1 tablet by mouth 2 (two) times daily.     Marland Kitchen levETIRAcetam (KEPPRA) 500 MG tablet Take 500 mg by mouth 2 (two) times daily.    Marland Kitchen loratadine (CLARITIN) 10 MG tablet Take 10 mg by mouth daily as needed for allergies.     . montelukast (SINGULAIR) 10 MG tablet Take 10 mg by mouth at bedtime.     . naproxen (NAPROSYN) 375 MG tablet Take 1 tablet (375 mg total) by mouth 2 (two) times daily. 20 tablet 0  . NOVOLOG FLEXPEN 100 UNIT/ML FlexPen Inject 14 Units into the skin 3 (three) times daily before meals. Takes if sugar > 200    . ondansetron (ZOFRAN) 4 MG tablet Take 1 tablet (4 mg total) by mouth every 6 (six) hours. 12 tablet 0  . Rivaroxaban (XARELTO) 20 MG TABS Take 20 mg by mouth at bedtime.     . rizatriptan (MAXALT-MLT) 5 MG disintegrating tablet Take 1 tablet (5 mg total) by mouth as needed for migraine. May repeat in 2 hours if needed 15 tablet 12  . lisinopril-hydrochlorothiazide (PRINZIDE,ZESTORETIC) 20-12.5 MG tablet Take 1 tablet by mouth daily. 90 tablet 3   No current facility-administered medications for this visit.    Allergies  Allergen Reactions  . Codeine Nausea And Vomiting    Pt takes percocet without problems   . Darvocet [Propoxyphene N-Acetaminophen] Nausea And Vomiting    vomiting  . Hydrocodone-Acetaminophen Nausea And Vomiting    Vomiting   . Latex Rash    rash  . Tramadol Nausea Only    Social History   Social History  . Marital Status: Single    Spouse Name: N/A  .  Number of Children: N/A  . Years of Education: N/A   Occupational History  . 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 Smoking status: Never Smoker   . Smokeless tobacco: Never Used  . Alcohol Use: Yes     Comment: occasional  . Drug Use: No  . Sexual Activity: Yes    Birth Control/ Protection: Surgical   Other Topics Concern  . Not  on file   Social History Narrative   Lives in [place] with significant other, Gwyndolyn Saxon   Completed 10th grade.   Worker Compensation Case 2009-2012 related to PE    Family History  Problem Relation Age of Onset  . Melanoma Father 10  . Diabetes Father   . Hypertension Father   . Kidney disease Father   . Ulcerative colitis Mother 75  . Diabetes Mother   . Hypertension Mother   . Breast cancer Paternal Grandmother   . Cancer Paternal Grandmother   . Diabetes type II Maternal Grandmother     deceased 20  . Diabetes Maternal Grandmother   . Pulmonary embolism Paternal Grandfather   . Stroke Maternal Grandfather    Physical Exam: Filed Vitals:   11/22/15 1132  BP: 152/106  Pulse: 88  Height: 5' (1.524 m)  Weight: 168 lb 12.8 oz (76.567 kg)    GEN- The patient is well appearing, alert and oriented x 3 today.   Head- normocephalic, atraumatic Eyes-  Sclera clear, conjunctiva pink Ears- hearing intact Oropharynx- clear Neck- supple,   Lungs- Clear to ausculation bilaterally, normal work of breathing Heart- Regular rate and rhythm, no murmurs, rubs or gallops, PMI not laterally displaced GI- soft, NT, ND, + BS Extremities- no clubbing, cyanosis, or edema  Stress echo and noted from [facility] are reviewed   Assessment and Plan:  1. Seizures Per neurology She had an implantable loop recorder previously x several years which revealed no arrhythmias to explain episodes.  Now improved with antiepiliptic medicine  2. Atypical chest pain Recent stress echo is  reviewed No further workup planned  3. Obesity Weight reduction advised  4. Prior pte On xarelto  5. HTN Stable No change required today  Will refer to general cardiology as she wishes to be followed her rather than with [facility] No further EP workup planned at this time I will see as needed going forward.  Thompson Grayer MD, [facility] 11/22/2015 10:21 PM

## 2015-11-29 DIAGNOSIS — K625 Hemorrhage of anus and rectum: Secondary | ICD-10-CM | POA: Insufficient documentation

## 2015-12-09 ENCOUNTER — Emergency Department (HOSPITAL_COMMUNITY)
Admission: EM | Admit: 2015-12-09 | Discharge: 2015-12-09 | Disposition: A | Discharge status: Home / Self Care | Payer: Medicare Other | Attending: Emergency Medicine | Admitting: Emergency Medicine

## 2015-12-09 ENCOUNTER — Emergency Department (HOSPITAL_COMMUNITY): Payer: Medicare Other

## 2015-12-09 ENCOUNTER — Encounter (HOSPITAL_COMMUNITY): Payer: Self-pay | Admitting: Emergency Medicine

## 2015-12-09 DIAGNOSIS — J45909 Unspecified asthma, uncomplicated: Secondary | ICD-10-CM | POA: Diagnosis not present

## 2015-12-09 DIAGNOSIS — E119 Type 2 diabetes mellitus without complications: Secondary | ICD-10-CM | POA: Insufficient documentation

## 2015-12-09 DIAGNOSIS — R350 Frequency of micturition: Secondary | ICD-10-CM | POA: Insufficient documentation

## 2015-12-09 DIAGNOSIS — Z91040 Latex allergy status: Secondary | ICD-10-CM | POA: Diagnosis not present

## 2015-12-09 DIAGNOSIS — Z915 Personal history of self-harm: Secondary | ICD-10-CM | POA: Diagnosis not present

## 2015-12-09 DIAGNOSIS — F329 Major depressive disorder, single episode, unspecified: Secondary | ICD-10-CM | POA: Insufficient documentation

## 2015-12-09 DIAGNOSIS — R110 Nausea: Secondary | ICD-10-CM | POA: Diagnosis not present

## 2015-12-09 DIAGNOSIS — Z7901 Long term (current) use of anticoagulants: Secondary | ICD-10-CM | POA: Insufficient documentation

## 2015-12-09 DIAGNOSIS — Z79899 Other long term (current) drug therapy: Secondary | ICD-10-CM | POA: Diagnosis not present

## 2015-12-09 DIAGNOSIS — R0789 Other chest pain: Secondary | ICD-10-CM | POA: Diagnosis not present

## 2015-12-09 DIAGNOSIS — I10 Essential (primary) hypertension: Secondary | ICD-10-CM | POA: Insufficient documentation

## 2015-12-09 DIAGNOSIS — E785 Hyperlipidemia, unspecified: Secondary | ICD-10-CM | POA: Diagnosis not present

## 2015-12-09 DIAGNOSIS — Z86711 Personal history of pulmonary embolism: Secondary | ICD-10-CM | POA: Insufficient documentation

## 2015-12-09 DIAGNOSIS — Z862 Personal history of diseases of the blood and blood-forming organs and certain disorders involving the immune mechanism: Secondary | ICD-10-CM | POA: Diagnosis not present

## 2015-12-09 DIAGNOSIS — Z794 Long term (current) use of insulin: Secondary | ICD-10-CM | POA: Insufficient documentation

## 2015-12-09 DIAGNOSIS — Z8619 Personal history of other infectious and parasitic diseases: Secondary | ICD-10-CM | POA: Diagnosis not present

## 2015-12-09 DIAGNOSIS — Z8669 Personal history of other diseases of the nervous system and sense organs: Secondary | ICD-10-CM | POA: Insufficient documentation

## 2015-12-09 DIAGNOSIS — E669 Obesity, unspecified: Secondary | ICD-10-CM | POA: Diagnosis not present

## 2015-12-09 DIAGNOSIS — R079 Chest pain, unspecified: Secondary | ICD-10-CM | POA: Diagnosis present

## 2015-12-09 DIAGNOSIS — Z8742 Personal history of other diseases of the female genital tract: Secondary | ICD-10-CM | POA: Diagnosis not present

## 2015-12-09 DIAGNOSIS — Z87440 Personal history of urinary (tract) infections: Secondary | ICD-10-CM | POA: Insufficient documentation

## 2015-12-09 DIAGNOSIS — Z86718 Personal history of other venous thrombosis and embolism: Secondary | ICD-10-CM | POA: Diagnosis not present

## 2015-12-09 DIAGNOSIS — R61 Generalized hyperhidrosis: Secondary | ICD-10-CM | POA: Diagnosis not present

## 2015-12-09 DIAGNOSIS — Z8673 Personal history of transient ischemic attack (TIA), and cerebral infarction without residual deficits: Secondary | ICD-10-CM | POA: Insufficient documentation

## 2015-12-09 LAB — I-STAT TROPONIN, ED
TROPONIN I, POC: 0 ng/mL (ref 0.00–0.08)
Troponin i, poc: 0 ng/mL (ref 0.00–0.08)

## 2015-12-09 LAB — URINALYSIS, ROUTINE W REFLEX MICROSCOPIC
BILIRUBIN URINE: NEGATIVE
Glucose, UA: >1000 mg/dL — AB
Hgb urine dipstick: NEGATIVE
KETONES UR: NEGATIVE mg/dL
LEUKOCYTES UA: NEGATIVE
NITRITE: NEGATIVE
PROTEIN: NEGATIVE mg/dL
Specific Gravity, Urine: 1.046 — ABNORMAL HIGH (ref 1.005–1.030)
pH: 5 (ref 5.0–8.0)

## 2015-12-09 LAB — BASIC METABOLIC PANEL
Anion gap: 10 (ref 5–15)
BUN: 11 mg/dL (ref 6–20)
CHLORIDE: 101 mmol/L (ref 101–111)
CO2: 27 mmol/L (ref 22–32)
CREATININE: 0.81 mg/dL (ref 0.44–1.00)
Calcium: 9.5 mg/dL (ref 8.9–10.3)
GFR calc Af Amer: >60 mL/min (ref >60)
Glucose, Bld: 196 mg/dL — ABNORMAL HIGH (ref 65–99)
Potassium: 3.9 mmol/L (ref 3.5–5.1)
SODIUM: 138 mmol/L (ref 135–145)

## 2015-12-09 LAB — URINE MICROSCOPIC-ADD ON: WBC UA: NONE SEEN WBC/hpf (ref 0–5)

## 2015-12-09 LAB — CBC
HEMATOCRIT: 41.9 % (ref 36.0–46.0)
HEMOGLOBIN: 13.8 g/dL (ref 12.0–15.0)
MCH: 25.7 pg — ABNORMAL LOW (ref 26.0–34.0)
MCHC: 32.9 g/dL (ref 30.0–36.0)
MCV: 78.2 fL (ref 78.0–100.0)
Platelets: 325 10*3/uL (ref 150–400)
RBC: 5.36 MIL/uL — AB (ref 3.87–5.11)
RDW: 15 % (ref 11.5–15.5)
WBC: 11.6 10*3/uL — AB (ref 4.0–10.5)

## 2015-12-09 MED ORDER — ONDANSETRON 4 MG PO TBDP
4.0000 mg | ORAL_TABLET | Freq: Once | ORAL | Status: AC
  Administered 2015-12-09: 4 mg via ORAL
  Filled 2015-12-09: qty 1

## 2015-12-09 MED ORDER — ONDANSETRON 4 MG PO TBDP: ORAL_TABLET | ORAL | Status: DC

## 2015-12-09 NOTE — ED Provider Notes (Signed)
CSN: [id number]     Arrival date & time 12/09/15  1358 History   First MD Initiated Contact with Patient 12/09/15 1847     Chief Complaint  Patient presents with  . Chest Pain     (Consider location/radiation/quality/duration/timing/severity/associated sxs/prior Treatment) HPI Comments: Patient awoke this morning with lower sternal/upper epigastric chest pressure associated with nausea and diaphoresis.  Pain is localized, moderately increased with palpation and deep inspiration.  Patient with history of PE, is currently on xarelto.  Patient has had numerous negative CTA's of the chest over the last 24 months.  Her diagnosed PE was in 2013.  Patient is not tachypneic or tachycardic.   Patient is a 44 y.o. female presenting with chest pain.  Chest Pain Pain location:  Substernal area and epigastric Pain quality: pressure   Pain radiates to:  Does not radiate Pain radiates to the back: no   Onset quality:  Sudden Duration:  12 hours Timing:  Intermittent Progression:  Waxing and waning Chronicity:  Recurrent Context: breathing   Associated symptoms: diaphoresis and nausea   Associated symptoms: not vomiting   Risk factors: prior DVT/PE     Past Medical History  Diagnosis Date  . PE (pulmonary embolism)     2009 - on oral contraception; multiple  . Syncopal episodes     unknown etiology  . Dysfunctional uterine bleeding     Seen by Dr. Leo Grosser, GYN; pending hysterectomy as of 07/2012  . Asthma   . Obesity   . Mastodynia   . Hypertension   . Post - coital bleeding 2012  . Labial lesion 2013  . BV (bacterial vaginosis) 2012  . Oligomenorrhea 2011  . DVT (deep venous thrombosis) ([place]) 2011    pt had IUD; also 05/2012  . H/O hypercoagulable state     2/2 contraception  . HLD (hyperlipidemia)   . DM (diabetes mellitus) ([place])     diagnosed 2009  . Anemia   . Herpes simplex without mention of complication 0000000    HSV-2  . Thyroid disease     hypothyroidism (pt denies)    . Vitamin D deficiency     unknown to pt.  . Major depression ([place])     Hx suicide attempt in 2009, [facility] admissions  . Venous insufficiency   . Sleep apnea   . Neuropathy ([place])   . Stroke [facility])     TIA 2013  . Headache(784.0)     migraines  . Neuromuscular disorder (HCC)     neuropathy  . Arthritis     Left leg  . Status post placement of implantable loop recorder   . Complication of anesthesia     Seizures in PACU after surgery 3/15  . Seizures ([place]) 2015    last episode May 2015  . Infection     UTI  . Vaginal Pap smear, abnormal   . SORE THROAT 08/10/2010    Qualifier: Diagnosis of  By: Vanessa Kick MD, Saralyn Pilar     Past Surgical History  Procedure Laterality Date  . Breast reduction surgery      Dr. Eugene Garnet [facility] 2011  . Cholecystectomy    . Cardiac electrophysiology study & dft      Implantable Loop recorder; no arrhythmias associated with synocpal spells; loop explanted 07-2013  . Endometrial biopsy  2011  . Dilatation & currettage/hysteroscopy with resectocope  2007 & 2013  . Laparoscopic assisted vaginal hysterectomy  12/18/2012    Procedure: LAPAROSCOPIC ASSISTED VAGINAL HYSTERECTOMY;  Surgeon: Seymour Bars  Haygood, MD;  Location: [facility];  Service: Gynecology;  Laterality: N/A;  . Iud removal  12/18/2012    Procedure: INTRAUTERINE DEVICE (IUD) REMOVAL;  Surgeon: Eldred Manges, MD;  Location: [facility];  Service: Gynecology;  Laterality: N/A;  Removed during prep by E. Florene Glen PA  . Bilateral salpingectomy  12/18/2012    Procedure: BILATERAL SALPINGECTOMY;  Surgeon: Eldred Manges, MD;  Location: [facility];  Service: Gynecology;  Laterality: Bilateral;  . Bladder suspension N/A 02/13/2014    Procedure: TRANSVAGINAL TAPE (TVT) PROCEDURE;  Surgeon: Delice Lesch, MD;  Location: [facility];  Service: Gynecology;  Laterality: N/A;  . Wisdom tooth extraction    . Transvaginal tape (tvt) removal N/A 04/28/2014    Procedure: Removal of suburethral mesh;  Surgeon: Delice Lesch, MD;   Location: [facility];  Service: Gynecology;  Laterality: N/A;  . Loop recorder explant N/A 07/17/2013    Procedure: LOOP RECORDER EXPLANT;  Surgeon: Thompson Grayer, MD;  Location: [facility] CATH LAB;  Service: Cardiovascular;  Laterality: N/A;   Family History  Problem Relation Age of Onset  . Melanoma Father 87  . Diabetes Father   . Hypertension Father   . Kidney disease Father   . Ulcerative colitis Mother 11  . Diabetes Mother   . Hypertension Mother   . Breast cancer Paternal Grandmother   . Cancer Paternal Grandmother   . Diabetes type II Maternal Grandmother     deceased 64  . Diabetes Maternal Grandmother   . Pulmonary embolism Paternal Grandfather   . Stroke Maternal Grandfather    Social History  Substance Use Topics  . Smoking status: Never Smoker   . Smokeless tobacco: Never Used  . Alcohol Use: Yes     Comment: occasional   OB History    Gravida Para Term Preterm AB TAB SAB Ectopic Multiple Living   0 0             Review of Systems  Constitutional: Positive for diaphoresis.  Cardiovascular: Positive for chest pain.  Gastrointestinal: Positive for nausea. Negative for vomiting.  Genitourinary: Positive for frequency.  All other systems reviewed and are negative.     Allergies  Codeine; Darvocet; Hydrocodone-acetaminophen; Latex; and Tramadol  Home Medications   Prior to Admission medications   Medication Sig Start Date End Date Taking? Authorizing Provider  acetaminophen (TYLENOL) 500 MG tablet Take 1,000 mg by mouth every 6 (six) hours as needed (for headache/pain.).   Yes Historical Provider, MD  albuterol (PROVENTIL HFA;VENTOLIN HFA) 108 (90 BASE) MCG/ACT inhaler Inhale 2 puffs into the lungs every 6 (six) hours as needed for wheezing or shortness of breath.   Yes Historical Provider, MD  amLODipine (NORVASC) 10 MG tablet Take 10 mg by mouth at bedtime.    Yes Historical Provider, MD  atorvastatin (LIPITOR) 80 MG tablet Take 80 mg by mouth daily. 12/01/15  Yes  Historical Provider, MD  budesonide-formoterol (SYMBICORT) 80-4.5 MCG/ACT inhaler Inhale 2 puffs into the lungs daily.   Yes Historical Provider, MD  fluticasone (FLONASE) 50 MCG/ACT nasal spray Place 1 spray into both nostrils daily as needed (for nasal congestion/sinus infection).  10/18/15  Yes Historical Provider, MD  Insulin Degludec 200 UNIT/ML SOPN Inject 50 Units into the skin at bedtime.   Yes Historical Provider, MD  INVOKAMET 150-500 MG TABS Take 1 tablet by mouth 2 (two) times daily.  09/25/14  Yes Historical Provider, MD  levETIRAcetam (KEPPRA) 500 MG tablet Take 500 mg by mouth 2 (two)  times daily. 01/14/15 01/14/16 Yes Historical Provider, MD  lisinopril-hydrochlorothiazide (PRINZIDE,ZESTORETIC) 20-12.5 MG tablet Take 1 tablet by mouth daily. 11/22/15  Yes Thompson Grayer, MD  loratadine (CLARITIN) 10 MG tablet Take 10 mg by mouth daily as needed for allergies.  08/04/13  Yes Historical Provider, MD  montelukast (SINGULAIR) 10 MG tablet Take 10 mg by mouth at bedtime.  04/13/15  Yes Historical Provider, MD  naproxen (NAPROSYN) 375 MG tablet Take 1 tablet (375 mg total) by mouth 2 (two) times daily. Patient taking differently: Take 375 mg by mouth 2 (two) times daily as needed for moderate pain.  11/06/15  Yes Ankit Nanavati, MD  NOVOLOG FLEXPEN 100 UNIT/ML FlexPen Inject 14 Units into the skin 3 (three) times daily as needed for high blood sugar. Takes if sugar > 200 01/08/14  Yes Historical Provider, MD  Rivaroxaban (XARELTO) 20 MG TABS Take 20 mg by mouth at bedtime.    Yes Historical Provider, MD  rizatriptan (MAXALT-MLT) 5 MG disintegrating tablet Take 1 tablet (5 mg total) by mouth as needed for migraine. May repeat in 2 hours if needed 05/07/14  Yes Marcial Pacas, MD   BP 152/94 mmHg  Pulse 96  Temp(Src) 98.4 F (36.9 C) (Oral)  Resp 12  SpO2 98%  LMP 12/19/2011 Physical Exam  Constitutional: She is oriented to person, place, and time. She appears well-developed and well-nourished. No  distress.  HENT:  Head: Normocephalic.  Eyes: Conjunctivae are normal.  Neck: Neck supple.  Cardiovascular: Normal rate and regular rhythm.   Pulmonary/Chest: Effort normal and breath sounds normal. She exhibits tenderness.    Abdominal: Soft. Bowel sounds are normal.  Musculoskeletal: She exhibits no edema or tenderness.  Lymphadenopathy:    She has no cervical adenopathy.  Neurological: She is alert and oriented to person, place, and time.  Skin: Skin is warm and dry.  Psychiatric: She has a normal mood and affect.  Nursing note and vitals reviewed.   ED Course  Procedures (including critical care time) Labs Review Labs Reviewed  BASIC METABOLIC PANEL - Abnormal; Notable for the following:    Glucose, Bld 196 (*)    All other components within normal limits  CBC - Abnormal; Notable for the following:    WBC 11.6 (*)    RBC 5.36 (*)    MCH 25.7 (*)    All other components within normal limits  URINALYSIS, ROUTINE W REFLEX MICROSCOPIC (NOT AT [facility]) - Abnormal; Notable for the following:    Specific Gravity, Urine 1.046 (*)    Glucose, UA >1000 (*)    All other components within normal limits  URINE MICROSCOPIC-ADD ON - Abnormal; Notable for the following:    Squamous Epithelial / LPF 0-5 (*)    Bacteria, UA FEW (*)    All other components within normal limits  I-STAT TROPOININ, ED  Randolm Idol, ED    Imaging Review Dg Chest 2 View  12/09/2015  CLINICAL DATA:  Shortness of breath and chest pain for 1 day EXAM: CHEST  2 VIEW COMPARISON:  November 06, 2015 FINDINGS: The lungs are clear. Heart size and pulmonary vascularity are normal. No adenopathy. No pneumothorax. No bone lesions. IMPRESSION: No abnormality noted. Electronically Signed   By: Lowella Grip III M.D.   On: 12/09/2015 14:44   I have personally reviewed and evaluated these images and lab results as part of my medical decision-making.   EKG Interpretation   Date/Time:  Thursday December 09 2015  14:04:57 EST Ventricular Rate:  96 PR Interval:  138 QRS Duration: 68 QT Interval:  438 QTC Calculation: 554 R Axis:   24 Text Interpretation:  Sinus rhythm Borderline abnrm T, anterolateral leads  Prolonged QT interval Nonspecific T wave abnormality No significant change  since last tracing Confirmed by Kathrynn Humble, MD, Thelma Comp [phone]) on 12/09/2015  6:55:04 PM     Patient with several ED visits for atypical chest pain. Has been evaluated by [facility] cardiology with normal stress echo in May 2016. Normal ECG tonight, negative delta troponin. Patient with cardiology evaluation on 11/26/15 with no further cardiac workup anticipated. Low suspicion for cardiac origin of symptoms.  MDM   Final diagnoses:  None    Chest wall pain. Care instructions provided. Return precautions discussed. Follow-up with PCP.    Etta Quill, NP 12/09/15 [id number]  Varney Biles, MD 12/09/15 [phone]

## 2015-12-09 NOTE — Discharge Instructions (Signed)

## 2015-12-09 NOTE — ED Notes (Signed)
Pt c/o central chest pressure pain, nausea onset this morning, Hx of PE and CVA, takes xarelto. Pt reports "SOB" with deep inspiration.

## 2015-12-16 ENCOUNTER — Ambulatory Visit: Payer: Medicare Other | Admitting: Cardiovascular Disease

## 2016-01-06 ENCOUNTER — Ambulatory Visit: Payer: Medicare Other | Admitting: Cardiovascular Disease

## 2016-01-17 ENCOUNTER — Encounter: Payer: Self-pay | Admitting: Cardiovascular Disease

## 2016-01-17 ENCOUNTER — Ambulatory Visit (INDEPENDENT_AMBULATORY_CARE_PROVIDER_SITE_OTHER): Payer: Medicare Other | Admitting: Cardiovascular Disease

## 2016-01-17 VITALS — BP 182/116 | HR 80 | Ht 60.0 in | Wt 170.2 lb

## 2016-01-17 DIAGNOSIS — R0789 Other chest pain: Secondary | ICD-10-CM

## 2016-01-17 DIAGNOSIS — I2782 Chronic pulmonary embolism: Secondary | ICD-10-CM | POA: Diagnosis not present

## 2016-01-17 DIAGNOSIS — I10 Essential (primary) hypertension: Secondary | ICD-10-CM

## 2016-01-17 DIAGNOSIS — Z7901 Long term (current) use of anticoagulants: Secondary | ICD-10-CM

## 2016-01-17 MED ORDER — LISINOPRIL-HYDROCHLOROTHIAZIDE 20-12.5 MG PO TABS: 2.0000 | ORAL_TABLET | Freq: Every day | ORAL | Status: DC

## 2016-01-17 NOTE — Progress Notes (Signed)
Cardiology Office Note   Date:  01/17/2016   ID:  Makayla Frazier, DOB December 08, 1971, MRN [id number]  PCP:  Suzanna Obey, MD  Cardiologist:   Sharol Harness, MD  Electrophysiologist: Thompson Grayer, MD  Chief Complaint  Patient presents with  . New Evaluation    referred by Dr. Allred//pt c/o chest pressure--occurs when she is cold or taking deep breaths, lasts about 5 minutes  . Shortness of Breath    on exertion  . Dizziness    random  . Edema    bilateral legs/feet/ankles      History of Present Illness: Makayla Frazier is a 45 y.o. female with hypertension, TIA, pulmonary embolism , OSA, and syncope who presents for an evaluation of chest pain.  Ms. Franchot Mimes saw Dr. Thompson Grayer on 12/12 for follow up of an implantable loop recorder that was implanted several years ago and did not reveal any arrhythmias.  She was subsequently diagnosed with seizures and her symptoms have improved with antiepileptics.  She has a history of atypical chest pain and had a stress echo 04/2015 with [facility] that was negative for ischemia.  She reports having chest pain since 2009. The episodes occur when taking a deep breath, and breathing cold air, or when walking.  There is associated shortness of breath and sometimes diaphoresis.  She denies nausea.  Episodes last for approximately 5 minutes at a time and are 5-6/10 in severity.  When the pain is severe she typically goes to sleep and the pain improved upon awakening.  She is able to exercise by walking on a treadmill three times per week for 30 minutes at a time.  This sometimes elicits pain, but she is able to push through and complete her exercise.   Ms. Franchot Mimes was diagnosed with a PE in 2010 and had a recurrent PE in 2013.  She has been on Xarelto chronically since that time.  It occurred in the setting of driving long distances and using OCPs.  She notes occasional lower extremity edema when sitting too long or after walking long  distances.  It is worse in her left leg where she had the DVT.  Ms. Franchot Mimes had an upper and lower endoscopy 5 years ago in the setting of GI bleeding.  No esophagitis or gastritis was noted.   Past Medical History  Diagnosis Date  . PE (pulmonary embolism)     2009 - on oral contraception; multiple  . Syncopal episodes     unknown etiology  . Dysfunctional uterine bleeding     Seen by Dr. Leo Grosser, GYN; pending hysterectomy as of 07/2012  . Asthma   . Obesity   . Mastodynia   . Hypertension   . Post - coital bleeding 2012  . Labial lesion 2013  . BV (bacterial vaginosis) 2012  . Oligomenorrhea 2011  . DVT (deep venous thrombosis) ([place]) 2011    pt had IUD; also 05/2012  . H/O hypercoagulable state     2/2 contraception  . HLD (hyperlipidemia)   . DM (diabetes mellitus) ([place])     diagnosed 2009  . Anemia   . Herpes simplex without mention of complication 0000000    HSV-2  . Thyroid disease     hypothyroidism (pt denies)  . Vitamin D deficiency     unknown to pt.  . Major depression ([place])     Hx suicide attempt in 2009, [facility] admissions  . Venous insufficiency   . Sleep apnea   .  Neuropathy ([place])   . Stroke [facility])     TIA 2013  . Headache(784.0)     migraines  . Neuromuscular disorder (HCC)     neuropathy  . Arthritis     Left leg  . Status post placement of implantable loop recorder   . Complication of anesthesia     Seizures in PACU after surgery 3/15  . Seizures ([place]) 2015    last episode May 2015  . Infection     UTI  . Vaginal Pap smear, abnormal   . SORE THROAT 08/10/2010    Qualifier: Diagnosis of  By: Vanessa Kick MD, Saralyn Pilar      Past Surgical History  Procedure Laterality Date  . Breast reduction surgery      Dr. Eugene Garnet [facility] 2011  . Cholecystectomy    . Cardiac electrophysiology study & dft      Implantable Loop recorder; no arrhythmias associated with synocpal spells; loop explanted 07-2013  . Endometrial biopsy  2011  . Dilatation &  currettage/hysteroscopy with resectocope  2007 & 2013  . Laparoscopic assisted vaginal hysterectomy  12/18/2012    Procedure: LAPAROSCOPIC ASSISTED VAGINAL HYSTERECTOMY;  Surgeon: Eldred Manges, MD;  Location: [place] [facility];  Service: Gynecology;  Laterality: N/A;  . Iud removal  12/18/2012    Procedure: INTRAUTERINE DEVICE (IUD) REMOVAL;  Surgeon: Eldred Manges, MD;  Location: [place] [facility];  Service: Gynecology;  Laterality: N/A;  Removed during prep by E. Florene Glen PA  . Bilateral salpingectomy  12/18/2012    Procedure: BILATERAL SALPINGECTOMY;  Surgeon: Eldred Manges, MD;  Location: [place] [facility];  Service: Gynecology;  Laterality: Bilateral;  . Bladder suspension N/A 02/13/2014    Procedure: TRANSVAGINAL TAPE (TVT) PROCEDURE;  Surgeon: Delice Lesch, MD;  Location: [place] [facility];  Service: Gynecology;  Laterality: N/A;  . Wisdom tooth extraction    . Transvaginal tape (tvt) removal N/A 04/28/2014    Procedure: Removal of suburethral mesh;  Surgeon: Delice Lesch, MD;  Location: [place] [facility];  Service: Gynecology;  Laterality: N/A;  . Loop recorder explant N/A 07/17/2013    Procedure: LOOP RECORDER EXPLANT;  Surgeon: Thompson Grayer, MD;  Location: [facility] CATH LAB;  Service: Cardiovascular;  Laterality: N/A;     Current Outpatient Prescriptions  Medication Sig Dispense Refill  . acetaminophen (TYLENOL) 500 MG tablet Take 1,000 mg by mouth every 6 (six) hours as needed (for headache/pain.).    Marland Kitchen albuterol (PROVENTIL HFA;VENTOLIN HFA) 108 (90 BASE) MCG/ACT inhaler Inhale 2 puffs into the lungs every 6 (six) hours as needed for wheezing or shortness of breath.    Marland Kitchen amLODipine (NORVASC) 10 MG tablet Take 10 mg by mouth at bedtime.     Marland Kitchen atorvastatin (LIPITOR) 80 MG tablet Take 80 mg by mouth daily.    . budesonide-formoterol (SYMBICORT) 80-4.5 MCG/ACT inhaler Inhale 2 puffs into the lungs daily.    . cephALEXin (KEFLEX) 500 MG capsule Take 1 capsule by mouth 2 (two) times daily. For a full 7 days.    . fluticasone (FLONASE) 50  MCG/ACT nasal spray Place 1 spray into both nostrils daily as needed (for nasal congestion/sinus infection).     . Insulin Degludec 200 UNIT/ML SOPN Inject 50 Units into the skin at bedtime.    . INVOKAMET 150-500 MG TABS Take 1 tablet by mouth 2 (two) times daily.     Marland Kitchen lisinopril-hydrochlorothiazide (PRINZIDE,ZESTORETIC) 20-12.5 MG tablet Take 2 tablets by mouth daily. 180 tablet 3  . loratadine (CLARITIN) 10 MG tablet Take 10 mg  by mouth daily as needed for allergies.     . montelukast (SINGULAIR) 10 MG tablet Take 10 mg by mouth at bedtime.     . naproxen (NAPROSYN) 375 MG tablet Take 1 tablet (375 mg total) by mouth 2 (two) times daily. (Patient taking differently: Take 375 mg by mouth 2 (two) times daily as needed for moderate pain. ) 20 tablet 0  . NOVOLOG FLEXPEN 100 UNIT/ML FlexPen Inject 14 Units into the skin 3 (three) times daily as needed for high blood sugar. Takes if sugar > 200    . ondansetron (ZOFRAN ODT) 4 MG disintegrating tablet 4mg  ODT q6 hours prn nausea/vomit 10 tablet 0  . Rivaroxaban (XARELTO) 20 MG TABS Take 20 mg by mouth at bedtime.     . rizatriptan (MAXALT-MLT) 5 MG disintegrating tablet Take 1 tablet (5 mg total) by mouth as needed for migraine. May repeat in 2 hours if needed 15 tablet 12  . terconazole (TERAZOL 7) 0.4 % vaginal cream Place 1 applicator vaginally daily.    Marland Kitchen levETIRAcetam (KEPPRA) 500 MG tablet Take 500 mg by mouth 2 (two) times daily.     No current facility-administered medications for this visit.    Allergies:   Codeine; Darvocet; Hydrocodone-acetaminophen; Latex; and Tramadol    Social History:  The patient  reports that she has never smoked. She has never used smokeless tobacco. She reports that she drinks alcohol. She reports that she does not use illicit drugs.   Family History:  The patient's family history includes Breast cancer in her paternal grandmother; Cancer in her paternal grandmother; Diabetes in her father, maternal  grandmother, and mother; Diabetes type II in her maternal grandmother; Hypertension in her father and mother; Kidney disease in her father; Melanoma (age of onset: 39) in her father; Pulmonary embolism in her paternal grandfather; Stroke in her maternal grandfather; Ulcerative colitis (age of onset: 68) in her mother.    ROS:  Please see the history of present illness.   Otherwise, review of systems are positive for none.   All other systems are reviewed and negative.    PHYSICAL EXAM: VS:  BP 182/116 mmHg  Pulse 80  Ht 5' (1.524 m)  Wt 77.202 kg (170 lb 3.2 oz)  BMI 33.24 kg/m2  LMP 12/19/2011 , BMI Body mass index is 33.24 kg/(m^2). GENERAL:  Well appearing HEENT:  Pupils equal round and reactive, fundi not visualized, oral mucosa unremarkable NECK:  No jugular venous distention, waveform within normal limits, carotid upstroke brisk and symmetric, no bruits, no thyromegaly LYMPHATICS:  No cervical adenopathy LUNGS:  Clear to auscultation bilaterally HEART:  RRR.  PMI not displaced or sustained,S1 and S2 within normal limits, no S3, no S4, no clicks, no rubs, no murmurs ABD:  Flat, positive bowel sounds normal in frequency in pitch, no bruits, no rebound, no guarding, no midline pulsatile mass, no hepatomegaly, no splenomegaly EXT:  2 plus pulses throughout, no edema, no cyanosis no clubbing SKIN:  No rashes no nodules NEURO:  Cranial nerves II through XII grossly intact, motor grossly intact throughout PSYCH:  Cognitively intact, oriented to person place and time    EKG:  EKG is ordered today. The ekg ordered today demonstrates sinus rhythm rate 80 bpm.  Non-specific t wave abnormalities.  Echo 05/28/12: LVEF >55%.  No diastolic dysfunction.  Trace PR.   Recent Labs: 05/12/2015: B Natriuretic Peptide 19.8 05/30/2015: ALT 26 12/09/2015: BUN 11; Creatinine, Ser 0.81; Hemoglobin 13.8; Platelets 325; Potassium 3.9; Sodium 138  Lipid Panel    Component Value Date/Time   CHOL 146  08/04/2012 0450   TRIG 99 08/04/2012 0450   HDL 36* 08/04/2012 0450   CHOLHDL 4.1 08/04/2012 0450   VLDL 20 08/04/2012 0450   LDLCALC 90 08/04/2012 0450      Wt Readings from Last 3 Encounters:  01/17/16 77.202 kg (170 lb 3.2 oz)  11/22/15 76.567 kg (168 lb 12.8 oz)  05/30/15 73.483 kg (162 lb)      ASSESSMENT AND PLAN:  # Atypical chest pain: Ms. Franchot Mimes continues to have episodes of atypical chest pain.  Given that this has been ongoing for over 5 years, has not worsened, she can exercise and had a negative stress test, it is very unlikely that this is due to obstructive coronary disease.  No further ischemia evaluation is indicated at this time.  We discussed taking acetaminophen for what is likely musculoskeletal pain.  # Hypertension: Blood pressure is very poorly-controlled today.  I recommended that she increase her Lisinopril/HCTZ to 40/25mg .  Continue amlodipine 10 mg daily.  She will follow up with Tommy Medal in 2 weeks for further titration.  I have also asked her to keep a log of her blood pressures at home.   # Pulmonary embolism: Lifelong anticoagulation with Xarelto.  Dosed appropriately. Current medicines are reviewed at length with the patient today.  The patient does not have concerns regarding medicines.  The following changes have been made:  Increase HCTZ/Lisinopril  Labs/ tests ordered today include:   Orders Placed This Encounter  Procedures  . EKG 12-Lead     Disposition:   FU with Reghan Thul C. Oval Linsey, MD, [facility]  in 6 months.   Tommy Medal, PharmD in 2 weeks.    This note was written with the assistance of speech recognition software.  Please excuse any transcriptional errors.  Signed, Orra Nolde C. Oval Linsey, MD, [facility]  01/17/2016 12:27 PM    [place]

## 2016-01-17 NOTE — Patient Instructions (Signed)
Dr Oval Linsey has recommended making the following medication changes: INCREASE Lisinopril-HCT - take 2 tablet daily  Your physician recommends that you schedule an appointment in 2 weeks with our pharmacist, Erasmo Downer, for a blood pressure check.  Dr Oval Linsey recommends that you schedule a follow-up appointment in 6 months. You will receive a reminder letter in the mail two months in advance. If you don't receive a letter, please call our office to schedule the follow-up appointment.  If you need a refill on your cardiac medications before your next appointment, please call your pharmacy.

## 2016-02-03 ENCOUNTER — Ambulatory Visit: Payer: Medicare Other | Admitting: Pharmacist Clinician (PhC)/ Clinical Pharmacy Specialist

## 2016-02-10 ENCOUNTER — Ambulatory Visit (INDEPENDENT_AMBULATORY_CARE_PROVIDER_SITE_OTHER): Payer: Medicare Other | Admitting: Pharmacist Clinician (PhC)/ Clinical Pharmacy Specialist

## 2016-02-10 VITALS — BP 146/102 | HR 80 | Ht 60.0 in | Wt 167.9 lb

## 2016-02-10 DIAGNOSIS — I10 Essential (primary) hypertension: Secondary | ICD-10-CM

## 2016-02-10 MED ORDER — LISINOPRIL 40 MG PO TABS: 40.0000 mg | ORAL_TABLET | Freq: Every day | ORAL | Status: DC

## 2016-02-10 MED ORDER — CHLORTHALIDONE 25 MG PO TABS: 25.0000 mg | ORAL_TABLET | Freq: Every day | ORAL | Status: DC

## 2016-02-10 NOTE — Patient Instructions (Signed)
Return for a a follow up appointment in 3 weeks  Your blood pressure today is 146/102  (goal is < 140/90)   Take your BP meds as follows:   Amlodipine 10 mg at evening/bedtime  Lisinopril 40 mg each morning  Chlorthalidone 25 mg each morning  Bring all of your meds, your BP cuff and your record of home blood pressures to your next appointment.  Exercise as you're able, try to walk approximately 30 minutes per day.  Keep salt intake to a minimum, especially watch canned and prepared boxed foods.  Eat more fresh fruits and vegetables and fewer canned items.  Avoid eating in fast food restaurants.    HOW TO TAKE YOUR BLOOD PRESSURE: . Rest 5 minutes before taking your blood pressure. .  Don't smoke or drink caffeinated beverages for at least 30 minutes before. . Take your blood pressure before (not after) you eat. . Sit comfortably with your back supported and both feet on the floor (don't cross your legs). . Elevate your arm to heart level on a table or a desk. . Use the proper sized cuff. It should fit smoothly and snugly around your bare upper arm. There should be enough room to slip a fingertip under the cuff. The bottom edge of the cuff should be 1 inch above the crease of the elbow. . Ideally, take 3 measurements at one sitting and record the average.

## 2016-02-13 ENCOUNTER — Encounter: Payer: Self-pay | Admitting: Pharmacist Clinician (PhC)/ Clinical Pharmacy Specialist

## 2016-02-13 NOTE — Assessment & Plan Note (Signed)
While her BP did improve with the increase in lisinopril hct, she is still not to goal.  Because of that, I am going to switch her lisinopril hct to lisinopril 40 mg and chlorthalidone 25 mg, both to be taken in the mornings.  She will switch her amlodipine to evenings.  I will see her again in about 3 weeks to check for improvement.   She was praised on her desire to lose weight and encouraged to get on the treadmill another day each week.  She does not currently have a home BP cuff.

## 2016-02-13 NOTE — Progress Notes (Signed)
02/13/2016 Makayla Frazier 04-21-1971 [id number]   HPI:  Makayla Frazier is a 45 y.o. female patient of Dr Oval Linsey, with a PMH below who presents today for hypertension clinic evaluation.  She last saw Dr. Oval Linsey on Feb 6, with a BP of 182/116.  At that time she was taking amlodipine 10 mg and lisinopril hct 20/12.5 mg.  Her lisinopril dose was doubled and she returns today for follow up.   Cardiac Hx: HTN, TIA, OSA, PE x2 (2010, 2013)  Family Hx: both parents with hypertension and DM,   Social Hx: no tobacco, occasional wine and caffeine (tea)  Diet: does not add salt, has been working to decrease intake for weight loss  Exercise: treadmill for 30 minutes 3 x per week  Home BP readings: no cuff  Current antihypertensive medications: amlodipine 10 mg qam, lisinopril hct 20/12.5 - 2 tabs qam   Wt Readings from Last 3 Encounters:  02/13/16 167 lb 14.4 oz (76.159 kg)  01/17/16 170 lb 3.2 oz (77.202 kg)  11/22/15 168 lb 12.8 oz (76.567 kg)   BP Readings from Last 3 Encounters:  02/13/16 146/102  01/17/16 182/116  12/09/15 132/94   Pulse Readings from Last 3 Encounters:  02/13/16 80  01/17/16 80  12/09/15 88    Current Outpatient Prescriptions  Medication Sig Dispense Refill  . acetaminophen (TYLENOL) 500 MG tablet Take 1,000 mg by mouth every 6 (six) hours as needed (for headache/pain.).    Marland Kitchen albuterol (PROVENTIL HFA;VENTOLIN HFA) 108 (90 BASE) MCG/ACT inhaler Inhale 2 puffs into the lungs every 6 (six) hours as needed for wheezing or shortness of breath.    Marland Kitchen amLODipine (NORVASC) 10 MG tablet Take 10 mg by mouth at bedtime.     Marland Kitchen atorvastatin (LIPITOR) 80 MG tablet Take 80 mg by mouth daily.    . budesonide-formoterol (SYMBICORT) 80-4.5 MCG/ACT inhaler Inhale 2 puffs into the lungs daily.    . cephALEXin (KEFLEX) 500 MG capsule Take 1 capsule by mouth 2 (two) times daily. For a full 7 days.    . chlorthalidone (HYGROTON) 25 MG tablet Take 1 tablet (25 mg total)  by mouth daily. 30 tablet 3  . fluticasone (FLONASE) 50 MCG/ACT nasal spray Place 1 spray into both nostrils daily as needed (for nasal congestion/sinus infection).     . Insulin Degludec 200 UNIT/ML SOPN Inject 50 Units into the skin at bedtime.    . INVOKAMET 150-500 MG TABS Take 1 tablet by mouth 2 (two) times daily.     Marland Kitchen levETIRAcetam (KEPPRA) 500 MG tablet Take 500 mg by mouth 2 (two) times daily.    Marland Kitchen lisinopril (PRINIVIL,ZESTRIL) 40 MG tablet Take 1 tablet (40 mg total) by mouth daily. 30 tablet 3  . loratadine (CLARITIN) 10 MG tablet Take 10 mg by mouth daily as needed for allergies.     . montelukast (SINGULAIR) 10 MG tablet Take 10 mg by mouth at bedtime.     . naproxen (NAPROSYN) 375 MG tablet Take 1 tablet (375 mg total) by mouth 2 (two) times daily. (Patient taking differently: Take 375 mg by mouth 2 (two) times daily as needed for moderate pain. ) 20 tablet 0  . NOVOLOG FLEXPEN 100 UNIT/ML FlexPen Inject 14 Units into the skin 3 (three) times daily as needed for high blood sugar. Takes if sugar > 200    . ondansetron (ZOFRAN ODT) 4 MG disintegrating tablet 4mg  ODT q6 hours prn nausea/vomit 10 tablet 0  . Rivaroxaban (  XARELTO) 20 MG TABS Take 20 mg by mouth at bedtime.     . rizatriptan (MAXALT-MLT) 5 MG disintegrating tablet Take 1 tablet (5 mg total) by mouth as needed for migraine. May repeat in 2 hours if needed 15 tablet 12  . terconazole (TERAZOL 7) 0.4 % vaginal cream Place 1 applicator vaginally daily.     No current facility-administered medications for this visit.    Allergies  Allergen Reactions  . Codeine Nausea And Vomiting    Pt takes percocet without problems   . Darvocet [Propoxyphene N-Acetaminophen] Nausea And Vomiting    vomiting  . Hydrocodone-Acetaminophen Nausea And Vomiting    Vomiting   . Latex Rash    rash  . Tramadol Nausea Only    Past Medical History  Diagnosis Date  . PE (pulmonary embolism)     2009 - on oral contraception; multiple  .  Syncopal episodes     unknown etiology  . Dysfunctional uterine bleeding     Seen by Dr. Leo Grosser, GYN; pending hysterectomy as of 07/2012  . Asthma   . Obesity   . Mastodynia   . Hypertension   . Post - coital bleeding 2012  . Labial lesion 2013  . BV (bacterial vaginosis) 2012  . Oligomenorrhea 2011  . DVT (deep venous thrombosis) ([place]) 2011    pt had IUD; also 05/2012  . H/O hypercoagulable state     2/2 contraception  . HLD (hyperlipidemia)   . DM (diabetes mellitus) ([place])     diagnosed 2009  . Anemia   . Herpes simplex without mention of complication 0000000    HSV-2  . Thyroid disease     hypothyroidism (pt denies)  . Vitamin D deficiency     unknown to pt.  . Major depression ([place])     Hx suicide attempt in 2009, [facility] admissions  . Venous insufficiency   . Sleep apnea   . Neuropathy ([place])   . Stroke [facility])     TIA 2013  . Headache(784.0)     migraines  . Neuromuscular disorder (HCC)     neuropathy  . Arthritis     Left leg  . Status post placement of implantable loop recorder   . Complication of anesthesia     Seizures in PACU after surgery 3/15  . Seizures ([place]) 2015    last episode May 2015  . Infection     UTI  . Vaginal Pap smear, abnormal   . SORE THROAT 08/10/2010    Qualifier: Diagnosis of  By: Vanessa Kick MD, Saralyn Pilar      Blood pressure 146/102, pulse 80, height 5' (1.524 m), weight 167 lb 14.4 oz (76.159 kg), last menstrual period 12/19/2011.    Tommy Medal PharmD CPP [facility]

## 2016-02-15 ENCOUNTER — Emergency Department (HOSPITAL_COMMUNITY)
Admission: EM | Admit: 2016-02-15 | Discharge: 2016-02-16 | Disposition: A | Discharge status: Home / Self Care | Payer: Medicare Other | Attending: Emergency Medicine | Admitting: Emergency Medicine

## 2016-02-15 ENCOUNTER — Encounter (HOSPITAL_COMMUNITY): Payer: Self-pay

## 2016-02-15 ENCOUNTER — Emergency Department (HOSPITAL_COMMUNITY): Payer: Medicare Other

## 2016-02-15 DIAGNOSIS — E119 Type 2 diabetes mellitus without complications: Secondary | ICD-10-CM | POA: Insufficient documentation

## 2016-02-15 DIAGNOSIS — E785 Hyperlipidemia, unspecified: Secondary | ICD-10-CM | POA: Insufficient documentation

## 2016-02-15 DIAGNOSIS — Z87440 Personal history of urinary (tract) infections: Secondary | ICD-10-CM | POA: Insufficient documentation

## 2016-02-15 DIAGNOSIS — I10 Essential (primary) hypertension: Secondary | ICD-10-CM | POA: Insufficient documentation

## 2016-02-15 DIAGNOSIS — R079 Chest pain, unspecified: Secondary | ICD-10-CM | POA: Diagnosis present

## 2016-02-15 DIAGNOSIS — R0789 Other chest pain: Secondary | ICD-10-CM | POA: Diagnosis not present

## 2016-02-15 DIAGNOSIS — E669 Obesity, unspecified: Secondary | ICD-10-CM | POA: Diagnosis not present

## 2016-02-15 DIAGNOSIS — Z794 Long term (current) use of insulin: Secondary | ICD-10-CM | POA: Insufficient documentation

## 2016-02-15 DIAGNOSIS — Z86718 Personal history of other venous thrombosis and embolism: Secondary | ICD-10-CM | POA: Diagnosis not present

## 2016-02-15 DIAGNOSIS — Z86711 Personal history of pulmonary embolism: Secondary | ICD-10-CM | POA: Insufficient documentation

## 2016-02-15 DIAGNOSIS — Z79899 Other long term (current) drug therapy: Secondary | ICD-10-CM | POA: Insufficient documentation

## 2016-02-15 DIAGNOSIS — Z862 Personal history of diseases of the blood and blood-forming organs and certain disorders involving the immune mechanism: Secondary | ICD-10-CM | POA: Diagnosis not present

## 2016-02-15 DIAGNOSIS — J45901 Unspecified asthma with (acute) exacerbation: Secondary | ICD-10-CM | POA: Insufficient documentation

## 2016-02-15 DIAGNOSIS — R110 Nausea: Secondary | ICD-10-CM | POA: Diagnosis not present

## 2016-02-15 DIAGNOSIS — Z8742 Personal history of other diseases of the female genital tract: Secondary | ICD-10-CM | POA: Insufficient documentation

## 2016-02-15 DIAGNOSIS — F329 Major depressive disorder, single episode, unspecified: Secondary | ICD-10-CM | POA: Diagnosis not present

## 2016-02-15 DIAGNOSIS — Z91040 Latex allergy status: Secondary | ICD-10-CM | POA: Insufficient documentation

## 2016-02-15 DIAGNOSIS — Z8619 Personal history of other infectious and parasitic diseases: Secondary | ICD-10-CM | POA: Diagnosis not present

## 2016-02-15 DIAGNOSIS — Z7901 Long term (current) use of anticoagulants: Secondary | ICD-10-CM | POA: Diagnosis not present

## 2016-02-15 DIAGNOSIS — Z8673 Personal history of transient ischemic attack (TIA), and cerebral infarction without residual deficits: Secondary | ICD-10-CM | POA: Diagnosis not present

## 2016-02-15 DIAGNOSIS — M1990 Unspecified osteoarthritis, unspecified site: Secondary | ICD-10-CM | POA: Insufficient documentation

## 2016-02-15 LAB — CBC
HEMATOCRIT: 45.5 % (ref 36.0–46.0)
Hemoglobin: 15 g/dL (ref 12.0–15.0)
MCH: 25.9 pg — AB (ref 26.0–34.0)
MCHC: 33 g/dL (ref 30.0–36.0)
MCV: 78.4 fL (ref 78.0–100.0)
Platelets: 334 10*3/uL (ref 150–400)
RBC: 5.8 MIL/uL — AB (ref 3.87–5.11)
RDW: 14.8 % (ref 11.5–15.5)
WBC: 13.9 10*3/uL — AB (ref 4.0–10.5)

## 2016-02-15 LAB — BASIC METABOLIC PANEL
Anion gap: 14 (ref 5–15)
BUN: 15 mg/dL (ref 6–20)
CHLORIDE: 92 mmol/L — AB (ref 101–111)
CO2: 31 mmol/L (ref 22–32)
Calcium: 10 mg/dL (ref 8.9–10.3)
Creatinine, Ser: 1.05 mg/dL — ABNORMAL HIGH (ref 0.44–1.00)
GFR calc non Af Amer: >60 mL/min (ref >60)
Glucose, Bld: 273 mg/dL — ABNORMAL HIGH (ref 65–99)
Potassium: 3.6 mmol/L (ref 3.5–5.1)
Sodium: 137 mmol/L (ref 135–145)

## 2016-02-15 LAB — I-STAT TROPONIN, ED: Troponin i, poc: 0 ng/mL (ref 0.00–0.08)

## 2016-02-15 NOTE — ED Notes (Signed)
Pt presents with c/o central chest pain that started approx one hour ago. Pt reports the pain is dull and aching in nature. Pt describes some nausea and shortness of breath with the pain. NAD at this time.

## 2016-02-16 DIAGNOSIS — R0789 Other chest pain: Secondary | ICD-10-CM | POA: Diagnosis not present

## 2016-02-16 LAB — I-STAT TROPONIN, ED: Troponin i, poc: 0 ng/mL (ref 0.00–0.08)

## 2016-02-16 MED ORDER — NAPROXEN 500 MG PO TABS: 500.0000 mg | ORAL_TABLET | Freq: Two times a day (BID) | ORAL | Status: DC

## 2016-02-16 MED ORDER — HYDROCODONE-ACETAMINOPHEN 5-325 MG PO TABS: 2.0000 | ORAL_TABLET | ORAL | Status: DC | PRN

## 2016-02-16 NOTE — ED Provider Notes (Signed)
CSN: [id number]     Arrival date & time 02/15/16  1430 History   By signing my name below, I, Makayla Frazier, attest that this documentation has been prepared under the direction and in the presence of Orpah Greek, MD.  Electronically Signed: Forrestine Frazier, ED Scribe. 02/16/2016. 12:36 AM.   Chief Complaint  Patient presents with  . Chest Pain   The history is provided by the patient. No language interpreter was used.    HPI Comments: Makayla Frazier is a 45 y.o. female with a PMHx of PE, HTN, DVT, DM, thyroid disease, and stroke who presents to the Emergency Department complaining of constant, ongoing central chest pain onset 1 hour prior to arrival. Pain is described as dull and achy. Discomfort is exacerbated with pressure to area. No alleviating factors at this time. Pt also reports some mild nausea and shortness of breath. No OTC medications or home remedies attempted prior to arrival. No recent fever, chills, vomiting, or abdominal pain. Pt was evaluated on 11/06/15 for same and safely discharged home after evaluation. She is currently on Xarelto. Denies any recent missed doses.  PCP: Suzanna Obey, MD    Past Medical History  Diagnosis Date  . PE (pulmonary embolism)     2009 - on oral contraception; multiple  . Syncopal episodes     unknown etiology  . Dysfunctional uterine bleeding     Seen by Dr. Leo Grosser, GYN; pending hysterectomy as of 07/2012  . Asthma   . Obesity   . Mastodynia   . Hypertension   . Post - coital bleeding 2012  . Labial lesion 2013  . BV (bacterial vaginosis) 2012  . Oligomenorrhea 2011  . DVT (deep venous thrombosis) ([place]) 2011    pt had IUD; also 05/2012  . H/O hypercoagulable state     2/2 contraception  . HLD (hyperlipidemia)   . DM (diabetes mellitus) ([place])     diagnosed 2009  . Anemia   . Herpes simplex without mention of complication 0000000    HSV-2  . Thyroid disease     hypothyroidism (pt denies)  . Vitamin D deficiency      unknown to pt.  . Major depression ([place])     Hx suicide attempt in 2009, [facility] admissions  . Venous insufficiency   . Sleep apnea   . Neuropathy ([place])   . Stroke [facility])     TIA 2013  . Headache(784.0)     migraines  . Neuromuscular disorder (HCC)     neuropathy  . Arthritis     Left leg  . Status post placement of implantable loop recorder   . Complication of anesthesia     Seizures in PACU after surgery 3/15  . Seizures ([place]) 2015    last episode May 2015  . Infection     UTI  . Vaginal Pap smear, abnormal   . SORE THROAT 08/10/2010    Qualifier: Diagnosis of  By: Vanessa Kick MD, Saralyn Pilar     Past Surgical History  Procedure Laterality Date  . Breast reduction surgery      Dr. Eugene Garnet [facility] 2011  . Cholecystectomy    . Cardiac electrophysiology study & dft      Implantable Loop recorder; no arrhythmias associated with synocpal spells; loop explanted 07-2013  . Endometrial biopsy  2011  . Dilatation & currettage/hysteroscopy with resectocope  2007 & 2013  . Laparoscopic assisted vaginal hysterectomy  12/18/2012    Procedure: LAPAROSCOPIC ASSISTED VAGINAL HYSTERECTOMY;  Surgeon:  Eldred Manges, MD;  Location: [facility];  Service: Gynecology;  Laterality: N/A;  . Iud removal  12/18/2012    Procedure: INTRAUTERINE DEVICE (IUD) REMOVAL;  Surgeon: Eldred Manges, MD;  Location: [facility];  Service: Gynecology;  Laterality: N/A;  Removed during prep by E. Florene Glen PA  . Bilateral salpingectomy  12/18/2012    Procedure: BILATERAL SALPINGECTOMY;  Surgeon: Eldred Manges, MD;  Location: [facility];  Service: Gynecology;  Laterality: Bilateral;  . Bladder suspension N/A 02/13/2014    Procedure: TRANSVAGINAL TAPE (TVT) PROCEDURE;  Surgeon: Delice Lesch, MD;  Location: [facility];  Service: Gynecology;  Laterality: N/A;  . Wisdom tooth extraction    . Transvaginal tape (tvt) removal N/A 04/28/2014    Procedure: Removal of suburethral mesh;  Surgeon: Delice Lesch, MD;  Location: [facility];  Service:  Gynecology;  Laterality: N/A;  . Loop recorder explant N/A 07/17/2013    Procedure: LOOP RECORDER EXPLANT;  Surgeon: Thompson Grayer, MD;  Location: [facility] CATH [facility];  Service: Cardiovascular;  Laterality: N/A;   Family History  Problem Relation Age of Onset  . Melanoma Father 55  . Diabetes Father   . Hypertension Father   . Kidney disease Father   . Ulcerative colitis Mother 66  . Diabetes Mother   . Hypertension Mother   . Breast cancer Paternal Grandmother   . Cancer Paternal Grandmother   . Diabetes type II Maternal Grandmother     deceased 64  . Diabetes Maternal Grandmother   . Pulmonary embolism Paternal Grandfather   . Stroke Maternal Grandfather    Social History  Substance Use Topics  . Smoking status: Never Smoker   . Smokeless tobacco: Never Used  . Alcohol Use: Yes     Comment: occasional   OB History    Gravida Para Term Preterm AB TAB SAB Ectopic Multiple Living   0 0             Review of Systems  Constitutional: Negative for fever and chills.  Respiratory: Positive for shortness of breath.   Cardiovascular: Positive for chest pain.  Gastrointestinal: Positive for nausea. Negative for vomiting and abdominal pain.  Neurological: Negative for headaches.  Psychiatric/Behavioral: Negative for confusion.  All other systems reviewed and are negative.     Allergies  Codeine; Darvocet; Hydrocodone-acetaminophen; Latex; and Tramadol  Home Medications   Prior to Admission medications   Medication Sig Start Date End Date Taking? Authorizing Provider  acetaminophen (TYLENOL) 500 MG tablet Take 1,000 mg by mouth every 6 (six) hours as needed (for headache/pain.).   Yes Historical Provider, MD  albuterol (PROVENTIL HFA;VENTOLIN HFA) 108 (90 BASE) MCG/ACT inhaler Inhale 2 puffs into the lungs every 6 (six) hours as needed for wheezing or shortness of breath.   Yes Historical Provider, MD  amLODipine (NORVASC) 10 MG tablet Take 10 mg by mouth at bedtime.    Yes Historical  Provider, MD  atorvastatin (LIPITOR) 80 MG tablet Take 80 mg by mouth daily. 12/01/15  Yes Historical Provider, MD  chlorthalidone (HYGROTON) 25 MG tablet Take 1 tablet (25 mg total) by mouth daily. 02/10/16  Yes Skeet Latch, MD  fluticasone (FLONASE) 50 MCG/ACT nasal spray Place 1 spray into both nostrils daily as needed (for nasal congestion/sinus infection).  10/18/15  Yes Historical Provider, MD  Insulin Degludec 200 UNIT/ML SOPN Inject 50 Units into the skin at bedtime.   Yes Historical Provider, MD  INVOKAMET 150-500 MG TABS Take 1 tablet by mouth 2 (two)  times daily.  09/25/14  Yes Historical Provider, MD  levETIRAcetam (KEPPRA) 500 MG tablet Take 500 mg by mouth 2 (two) times daily. 01/14/15 02/16/16 Yes Historical Provider, MD  lisinopril (PRINIVIL,ZESTRIL) 40 MG tablet Take 1 tablet (40 mg total) by mouth daily. 02/10/16  Yes Skeet Latch, MD  loratadine (CLARITIN) 10 MG tablet Take 10 mg by mouth daily as needed for allergies.  08/04/13  Yes Historical Provider, MD  montelukast (SINGULAIR) 10 MG tablet Take 10 mg by mouth at bedtime as needed (allergies). Reported on 02/16/2016 04/13/15  Yes Historical Provider, MD  NOVOLOG FLEXPEN 100 UNIT/ML FlexPen Inject 14 Units into the skin 3 (three) times daily as needed for high blood sugar. Takes if sugar > 200 01/08/14  Yes Historical Provider, MD  Rivaroxaban (XARELTO) 20 MG TABS Take 20 mg by mouth at bedtime.    Yes Historical Provider, MD  HYDROcodone-acetaminophen (NORCO/VICODIN) 5-325 MG tablet Take 2 tablets by mouth every 4 (four) hours as needed for moderate pain. 02/16/16   Orpah Greek, MD  naproxen (NAPROSYN) 375 MG tablet Take 1 tablet (375 mg total) by mouth 2 (two) times daily. Patient not taking: Reported on 02/16/2016 11/06/15   Varney Biles, MD  ondansetron (ZOFRAN ODT) 4 MG disintegrating tablet 4mg  ODT q6 hours prn nausea/vomit Patient not taking: Reported on 02/16/2016 12/09/15   Etta Quill, NP  rizatriptan (MAXALT-MLT) 5  MG disintegrating tablet Take 1 tablet (5 mg total) by mouth as needed for migraine. May repeat in 2 hours if needed Patient not taking: Reported on 02/16/2016 05/07/14   Marcial Pacas, MD   Triage Vitals: BP 155/96 mmHg  Pulse 89  Temp(Src) 98.4 F (36.9 C) (Oral)  Resp 16  SpO2 97%  LMP 12/19/2011   Physical Exam  Constitutional: She is oriented to person, place, and time. She appears well-developed and well-nourished. No distress.  HENT:  Head: Normocephalic and atraumatic.  Right Ear: Hearing normal.  Left Ear: Hearing normal.  Nose: Nose normal.  Mouth/Throat: Oropharynx is clear and moist and mucous membranes are normal.  Eyes: Conjunctivae and EOM are normal. Pupils are equal, round, and reactive to light.  Neck: Normal range of motion. Neck supple.  Cardiovascular: Regular rhythm, S1 normal and S2 normal.  Exam reveals no gallop and no friction rub.   No murmur heard. Pulmonary/Chest: Effort normal and breath sounds normal. No respiratory distress. She exhibits tenderness.  Tenderness to low sternal area  Abdominal: Soft. Normal appearance and bowel sounds are normal. There is no hepatosplenomegaly. There is no tenderness. There is no rebound, no guarding, no tenderness at McBurney's point and negative Murphy's sign. No hernia.  Musculoskeletal: Normal range of motion.  Neurological: She is alert and oriented to person, place, and time. She has normal strength. No cranial nerve deficit or sensory deficit. Coordination normal. GCS eye subscore is 4. GCS verbal subscore is 5. GCS motor subscore is 6.  Skin: Skin is warm, dry and intact. No rash noted. No cyanosis.  Psychiatric: She has a normal mood and affect. Her speech is normal and behavior is normal. Thought content normal.  Nursing note and vitals reviewed.   ED Course  Procedures (including critical care time)  DIAGNOSTIC STUDIES: Oxygen Saturation is 95% on RA, adequate by my interpretation.    COORDINATION OF  CARE: 12:27 AM- Will order BMP, CBC, i-stat troponin, CXR, and EKG. Discussed treatment plan with pt at bedside and pt agreed to plan.     Labs Review Labs Reviewed  BASIC METABOLIC PANEL - Abnormal; Notable for the following:    Chloride 92 (*)    Glucose, Bld 273 (*)    Creatinine, Ser 1.05 (*)    All other components within normal limits  CBC - Abnormal; Notable for the following:    WBC 13.9 (*)    RBC 5.80 (*)    MCH 25.9 (*)    All other components within normal limits  I-STAT TROPOININ, ED  Randolm Idol, ED    Imaging Review Dg Chest 2 View  02/15/2016  CLINICAL DATA:  Chest pain today EXAM: CHEST  2 VIEW COMPARISON:  08/31/2015 FINDINGS: Normal heart size. Lungs clear. No pneumothorax. No pleural effusion. IMPRESSION: No active cardiopulmonary disease. Electronically Signed   By: Marybelle Killings M.D.   On: 02/15/2016 15:27   I have personally reviewed and evaluated these images and lab results as part of my medical decision-making.   EKG Interpretation   Date/Time:  Tuesday February 15 2016 14:36:25 EST Ventricular Rate:  99 PR Interval:  148 QRS Duration: 86 QT Interval:  356 QTC Calculation: 457 R Axis:   33 Text Interpretation:  Sinus rhythm Probable left atrial enlargement  Borderline T abnormalities, anterior leads Baseline wander in lead(s) III  No significant change since last tracing Confirmed by POLLINA  MD,  CHRISTOPHER [phone]) on 02/16/2016 1:46:45 AM      MDM   Final diagnoses:  Chest wall pain   Patient presents to the ER for evaluation of chest pain. Patient reports onset of pain 1 hour before coming to the ER. She reports a dull aching pain in the center of her chest. She has had similar symptoms in the past. Examination reveals significant tenderness over the sternum consistent with chest wall pain. This has been her diagnosis in the past with this pain. Patient has had 2 cardiac  troponins with no elevation. EKG unchanged from previous.  Patient  does have a history of PE. She assures me that she has been taking her Xarelto as prescribed, no missed doses. She is not hypoxic or tachycardic. I do not suspect recurrent PE at this time. Patient has had identical symptoms evaluated in the ER multiple times including multiple negative CT angiography.  I personally performed the services described in this documentation, which was scribed in my presence. The recorded information has been reviewed and is accurate.   Orpah Greek, MD 02/16/16 [phone]

## 2016-02-16 NOTE — Discharge Instructions (Signed)

## 2016-03-06 ENCOUNTER — Ambulatory Visit (INDEPENDENT_AMBULATORY_CARE_PROVIDER_SITE_OTHER): Payer: Medicare Other | Admitting: Pharmacist Clinician (PhC)/ Clinical Pharmacy Specialist

## 2016-03-06 ENCOUNTER — Encounter: Payer: Self-pay | Admitting: Pharmacist Clinician (PhC)/ Clinical Pharmacy Specialist

## 2016-03-06 VITALS — BP 140/88 | HR 76 | Ht 60.0 in | Wt 163.4 lb

## 2016-03-06 DIAGNOSIS — I10 Essential (primary) hypertension: Secondary | ICD-10-CM

## 2016-03-06 NOTE — Assessment & Plan Note (Signed)
Patient returns today with much improved blood pressure at 140/88.  Diastolic pressure had been as high as 116 this year.  She is also down 4 pounds from her visit 3 weeks ago.  I will have her continue with current medications as well as her exercise and weight loss regimen.  I will see her back in 2 months for follow up.  I did ask that she go to the lab in another week for a repeat BMET. Was drawn at ER and noted to have a slight bump in SCr from 0.8 to 1.

## 2016-03-06 NOTE — Progress Notes (Signed)
03/06/2016 Makayla Frazier [birth year] [id number]   HPI:  Makayla Frazier is a 45 y.o. female patient of Dr Makayla Frazier, with a PMH below who presents today for hypertension clinic follow up.  When she originally came from Dr. Oval Frazier she was on lisinopril hctz 40/25 and amlodipine 10 mg - she took all in the mornings.  When I saw her a few weeks later we switched the amlodipine to evenings, the lisinopril hctz 40/25 to lisinopril 40 mg and chlorthalidone 25 mg.  Since that visit, she had one trip to the ER for chest pain.  At that visit troponins were negative and EKG was unchanged from last.  Today she reports to feeling well, no side effects from medications and states compliant with daily regimen.    Cardiac Hx: HTN, TIA, OSA, PE x2 (2010, 2013)  Family Hx: both parents with hypertension and DM,   Social Hx: no tobacco, occasional wine and caffeine (tea)  Diet: does not add salt, has been working to decrease intake for weight loss  Exercise: treadmill for 30 minutes 3 x per week  Home BP readings: no cuff  Current antihypertensive medications: amlodipine 10 mg qpm, lisinopril 40 mg qam, chlorthalidone 25 mg qam    Wt Readings from Last 3 Encounters:  03/06/16 163 lb 6.4 oz (74.118 kg)  02/13/16 167 lb 14.4 oz (76.159 kg)  01/17/16 170 lb 3.2 oz (77.202 kg)   BP Readings from Last 3 Encounters:  03/06/16 140/88  02/16/16 156/101  02/13/16 146/102   Pulse Readings from Last 3 Encounters:  03/06/16 76  02/16/16 95  02/13/16 80    Current Outpatient Prescriptions  Medication Sig Dispense Refill  . acetaminophen (TYLENOL) 500 MG tablet Take 1,000 mg by mouth every 6 (six) hours as needed (for headache/pain.).    Marland Kitchen albuterol (PROVENTIL HFA;VENTOLIN HFA) 108 (90 BASE) MCG/ACT inhaler Inhale 2 puffs into the lungs every 6 (six) hours as needed for wheezing or shortness of breath.    Marland Kitchen amLODipine (NORVASC) 10 MG tablet Take 10 mg by mouth at bedtime.     Marland Kitchen atorvastatin  (LIPITOR) 80 MG tablet Take 80 mg by mouth daily.    . chlorthalidone (HYGROTON) 25 MG tablet Take 1 tablet (25 mg total) by mouth daily. 30 tablet 3  . fluticasone (FLONASE) 50 MCG/ACT nasal spray Place 1 spray into both nostrils daily as needed (for nasal congestion/sinus infection).     . Insulin Degludec 200 UNIT/ML SOPN Inject 50 Units into the skin at bedtime.    . INVOKAMET 150-500 MG TABS Take 1 tablet by mouth 2 (two) times daily.     Marland Kitchen levETIRAcetam (KEPPRA) 500 MG tablet Take 500 mg by mouth 2 (two) times daily.    Marland Kitchen lisinopril (PRINIVIL,ZESTRIL) 40 MG tablet Take 1 tablet (40 mg total) by mouth daily. 30 tablet 3  . loratadine (CLARITIN) 10 MG tablet Take 10 mg by mouth daily as needed for allergies.     . montelukast (SINGULAIR) 10 MG tablet Take 10 mg by mouth at bedtime as needed (allergies). Reported on 02/16/2016    . naproxen (NAPROSYN) 500 MG tablet Take 1 tablet (500 mg total) by mouth 2 (two) times daily. 30 tablet 0  . NOVOLOG FLEXPEN 100 UNIT/ML FlexPen Inject 14 Units into the skin 3 (three) times daily as needed for high blood sugar. Takes if sugar > 200    . ondansetron (ZOFRAN ODT) 4 MG disintegrating tablet 4mg  ODT q6 hours prn  nausea/vomit (Patient not taking: Reported on 02/16/2016) 10 tablet 0  . Rivaroxaban (XARELTO) 20 MG TABS Take 20 mg by mouth at bedtime.     . rizatriptan (MAXALT-MLT) 5 MG disintegrating tablet Take 1 tablet (5 mg total) by mouth as needed for migraine. May repeat in 2 hours if needed (Patient not taking: Reported on 02/16/2016) 15 tablet 12   No current facility-administered medications for this visit.    Allergies  Allergen Reactions  . Codeine Nausea And Vomiting    Pt takes percocet without problems   . Darvocet [Propoxyphene N-Acetaminophen] Nausea And Vomiting    vomiting  . Hydrocodone-Acetaminophen Nausea And Vomiting    Vomiting   . Latex Rash    rash  . Tramadol Nausea Only    Past Medical History  Diagnosis Date  . PE  (pulmonary embolism)     2009 - on oral contraception; multiple  . Syncopal episodes     unknown etiology  . Dysfunctional uterine bleeding     Seen by Dr. Leo Grosser, GYN; pending hysterectomy as of 07/2012  . Asthma   . Obesity   . Mastodynia   . Hypertension   . Post - coital bleeding 2012  . Labial lesion 2013  . BV (bacterial vaginosis) 2012  . Oligomenorrhea 2011  . DVT (deep venous thrombosis) ([place]) 2011    pt had IUD; also 05/2012  . H/O hypercoagulable state     2/2 contraception  . HLD (hyperlipidemia)   . DM (diabetes mellitus) ([place])     diagnosed 2009  . Anemia   . Herpes simplex without mention of complication 0000000    HSV-2  . Thyroid disease     hypothyroidism (pt denies)  . Vitamin D deficiency     unknown to pt.  . Major depression ([place])     Hx suicide attempt in 2009, [facility] admissions  . Venous insufficiency   . Sleep apnea   . Neuropathy ([place])   . Stroke [facility])     TIA 2013  . Headache(784.0)     migraines  . Neuromuscular disorder (HCC)     neuropathy  . Arthritis     Left leg  . Status post placement of implantable loop recorder   . Complication of anesthesia     Seizures in PACU after surgery 3/15  . Seizures ([place]) 2015    last episode May 2015  . Infection     UTI  . Vaginal Pap smear, abnormal   . SORE THROAT 08/10/2010    Qualifier: Diagnosis of  By: Vanessa Kick MD, Saralyn Pilar      Blood pressure 140/88, pulse 76, height 5' (1.524 m), weight 163 lb 6.4 oz (74.118 kg), last menstrual period 12/19/2011.    Tommy Medal PharmD CPP [facility]

## 2016-03-06 NOTE — Patient Instructions (Signed)
Return for a a follow up appointment in 2 months  Your blood pressure today is 140/88  (goal is < 140/90)  Check your blood pressure at home daily (if able) and keep record of the readings.  Take your BP meds as follows: continue with all current medications  Bring all of your meds, your BP cuff and your record of home blood pressures to your next appointment.  Exercise as you're able, try to walk approximately 30 minutes per day.  Keep salt intake to a minimum, especially watch canned and prepared boxed foods.  Eat more fresh fruits and vegetables and fewer canned items.  Avoid eating in fast food restaurants.    HOW TO TAKE YOUR BLOOD PRESSURE: . Rest 5 minutes before taking your blood pressure. .  Don't smoke or drink caffeinated beverages for at least 30 minutes before. . Take your blood pressure before (not after) you eat. . Sit comfortably with your back supported and both feet on the floor (don't cross your legs). . Elevate your arm to heart level on a table or a desk. . Use the proper sized cuff. It should fit smoothly and snugly around your bare upper arm. There should be enough room to slip a fingertip under the cuff. The bottom edge of the cuff should be 1 inch above the crease of the elbow. . Ideally, take 3 measurements at one sitting and record the average.

## 2016-03-07 DIAGNOSIS — F439 Reaction to severe stress, unspecified: Secondary | ICD-10-CM | POA: Insufficient documentation

## 2016-04-03 ENCOUNTER — Ambulatory Visit: Payer: Medicare Other | Admitting: Sports Medicine

## 2016-04-25 ENCOUNTER — Ambulatory Visit (INDEPENDENT_AMBULATORY_CARE_PROVIDER_SITE_OTHER): Payer: Medicare Other | Admitting: Sports Medicine

## 2016-04-25 ENCOUNTER — Encounter: Payer: Self-pay | Admitting: Sports Medicine

## 2016-04-25 DIAGNOSIS — M7661 Achilles tendinitis, right leg: Secondary | ICD-10-CM

## 2016-04-25 DIAGNOSIS — B351 Tinea unguium: Secondary | ICD-10-CM | POA: Diagnosis not present

## 2016-04-25 DIAGNOSIS — E119 Type 2 diabetes mellitus without complications: Secondary | ICD-10-CM | POA: Diagnosis not present

## 2016-04-25 DIAGNOSIS — M7662 Achilles tendinitis, left leg: Secondary | ICD-10-CM

## 2016-04-25 DIAGNOSIS — M79676 Pain in unspecified toe(s): Secondary | ICD-10-CM | POA: Diagnosis not present

## 2016-04-25 NOTE — Patient Instructions (Addendum)
Diabetes and Foot Care Diabetes may cause you to have problems because of poor blood supply (circulation) to your feet and legs. This may cause the skin on your feet to become thinner, break easier, and heal more slowly. Your skin may become dry, and the skin may peel and crack. You may also have nerve damage in your legs and feet causing decreased feeling in them. You may not notice minor injuries to your feet that could lead to infections or more serious problems. Taking care of your feet is one of the most important things you can do for yourself.  HOME CARE INSTRUCTIONS  Wear shoes at all times, even in the house. Do not go barefoot. Bare feet are easily injured.  Check your feet daily for blisters, cuts, and redness. If you cannot see the bottom of your feet, use a mirror or ask someone for help.  Wash your feet with warm water (do not use hot water) and mild soap. Then pat your feet and the areas between your toes until they are completely dry. Do not soak your feet as this can dry your skin.  Apply a moisturizing lotion or petroleum jelly (that does not contain alcohol and is unscented) to the skin on your feet and to dry, brittle toenails. Do not apply lotion between your toes.  Trim your toenails straight across. Do not dig under them or around the cuticle. File the edges of your nails with an emery board or nail file.  Do not cut corns or calluses or try to remove them with medicine.  Wear clean socks or stockings every day. Make sure they are not too tight. Do not wear knee-high stockings since they may decrease blood flow to your legs.  Wear shoes that fit properly and have enough cushioning. To break in new shoes, wear them for just a few hours a day. This prevents you from injuring your feet. Always look in your shoes before you put them on to be sure there are no objects inside.  Do not cross your legs. This may decrease the blood flow to your feet.  If you find a minor scrape,  cut, or break in the skin on your feet, keep it and the skin around it clean and dry. These areas may be cleansed with mild soap and water. Do not cleanse the area with peroxide, alcohol, or iodine.  When you remove an adhesive bandage, be sure not to damage the skin around it.  If you have a wound, look at it several times a day to make sure it is healing.  Do not use heating pads or hot water bottles. They may burn your skin. If you have lost feeling in your feet or legs, you may not know it is happening until it is too late.  Make sure your health care provider performs a complete foot exam at least annually or more often if you have foot problems. Report any cuts, sores, or bruises to your health care provider immediately. SEEK MEDICAL CARE IF:   You have an injury that is not healing.  You have cuts or breaks in the skin.  You have an ingrown nail.  You notice redness on your legs or feet.  You feel burning or tingling in your legs or feet.  You have pain or cramps in your legs and feet.  Your legs or feet are numb.  Your feet always feel cold. SEEK IMMEDIATE MEDICAL CARE IF:   There is increasing redness,   swelling, or pain in or around a wound.  There is a red line that goes up your leg.  Pus is coming from a wound.  You develop a fever or as directed by your health care provider.  You notice a bad smell coming from an ulcer or wound.   This information is not intended to replace advice given to you by your health care provider. Make sure you discuss any questions you have with your health care provider.   Document Released: 11/24/2000 Document Revised: 07/30/2013 Document Reviewed: 05/06/2013 Elsevier Interactive Patient Education 2016 Else[employer] Inc[employer] Achilles Tendinitis Achilles tendinitis is inflammation of the tough, cord-like band that attaches the lower muscles of your leg to your heel (Achilles tendon). It is usually caused by overusing the tendon and joint  involved.  CAUSES Achilles tendinitis can happen because of:  A sudden increase in exercise or activity (such as running).  Doing the same exercises or activities (such as jumping) over and over.  Not warming up calf muscles before exercising.  Exercising in shoes that are worn out or not made for exercise.  Having arthritis or a bone growth on the back of the heel bone. This can rub against the tendon and hurt the tendon. SIGNS AND SYMPTOMS The most common symptoms are:  Pain in the back of the leg, just above the heel. The pain usually gets worse with exercise and better with rest.  Stiffness or soreness in the back of the leg, especially in the morning.  Swelling of the skin over the Achilles tendon.  Trouble standing on tiptoe. Sometimes, an Achilles tendon tears (ruptures). Symptoms of an Achilles tendon rupture can include:  Sudden, severe pain in the back of the leg.  Trouble putting weight on the foot or walking normally. DIAGNOSIS Achilles tendinitis will be diagnosed based on symptoms and a physical examination. An X-ray may be done to check if another condition is causing your symptoms. An MRI may be ordered if your health care provider suspects you may have completely torn your tendon, which is called an Achilles tendon rupture.  TREATMENT  Achilles tendinitis usually gets better over time. It can take weeks to months to heal completely. Treatment focuses on treating the symptoms and helping the injury heal. HOME CARE INSTRUCTIONS   Rest your Achilles tendon and avoid activities that cause pain.  Apply ice to the injured area:  Put ice in a plastic bag.  Place a towel between your skin and the bag.  Leave the ice on for 20 minutes, 2-3 times a day  Try to avoid using the tendon (other than gentle range of motion) while the tendon is painful. Do not resume use until instructed by your health care provider. Then begin use gradually. Do not increase use to the  point of pain. If pain does develop, decrease use and continue the above measures. Gradually increase activities that do not cause discomfort until you achieve normal use.  Do exercises to make your calf muscles stronger and more flexible. Your health care provider or physical therapist can recommend exercises for you to do.  Wrap your ankle with an elastic bandage or other wrap. This can help keep your tendon from moving too much. Your health care provider will show you how to wrap your ankle correctly.  Only take over-the-counter or prescription medicines for pain, discomfort, or fever as directed by your health care provider. SEEK MEDICAL CARE IF:   Your pain and swelling increase or pain is uncontrolled  with medicines.  You develop new, unexplained symptoms or your symptoms get worse.  You are unable to move your toes or foot.  You develop warmth and swelling in your foot.  You have an unexplained temperature. MAKE SURE YOU:   Understand these instructions.  Will watch your condition.  Will get help right away if you are not doing well or get worse.   This information is not intended to replace advice given to you by your health care provider. Make sure you discuss any questions you have with your health care provider.   Document Released: 09/06/2005 Document Revised: 12/18/2014 Document Reviewed: 07/09/2013 Elsevier Interactive Patient Education [employer].

## 2016-04-25 NOTE — Progress Notes (Signed)
Patient ID: Makayla Frazier, female   DOB: [birth year], 45 y.o.   MRN: [id number] Subjective: Makayla Frazier is a 45 y.o. female patient with history of diabetes who presents to office today complaining of long, painful nails  while ambulating in shoes; unable to trim. Patient states that the glucose reading range 100-200 mg/dl. Diagnosed in 2009. Patient denies any new changes in medication or new problems. Patient denies any new cramping, numbness, burning or tingling in the legs.   Patient admits also to pain at the bumps on the backs of both heels L>R and wanted to have this looked at as well. No other issues.  Patient Active Problem List   Diagnosis Date Noted  . Reaction to severe stress 03/07/2016  . Bleeding per rectum 11/29/2015  . Acute pansinusitis 10/20/2015  . Candida vaginitis 10/20/2015  . Cerebrovascular accident, late effects 08/30/2015  . D (diarrhea) 08/02/2015  . Seizure disorder ([place]) 07/13/2015  . Adnexal pain 06/08/2015  . Right flank pain 05/30/2015  . Chronic pulmonary embolism ([place]) 05/14/2015  . Long term current use of anticoagulant 05/14/2015  . Seizures ([place]) 05/07/2014  . Absence of bladder continence 03/27/2014  . Arthropathia 02/25/2014  . Chronic pain 02/25/2014  . Healed or old pulmonary embolism 02/25/2014  . HLD (hyperlipidemia) 02/25/2014  . Headache, migraine 02/25/2014  . Obstructive apnea 02/25/2014  . Type 2 diabetes mellitus ([place]) 02/25/2014  . Incontinence 02/13/2014  . S/P vaginal hysterectomy 01/29/2013  . TIA (transient ischemic attack) 08/03/2012  . Syncope 08/03/2012  . Left-sided weakness 08/03/2012  . Hypokalemia 08/03/2012  . Chest pain 08/03/2012  . Temporary cerebral vascular dysfunction 08/03/2012  . Pulmonary embolism ([place]) 05/18/2012  . Patellar tendinitis of left knee 08/21/2011  . Knee pain, left 07/07/2011  . PRURITUS 07/04/2010  . Syncope and collapse 10/18/2009  . Allergic rhinitis 08/11/2009  . Unspecified  vitamin D deficiency 07/21/2009  . Adiposity 07/19/2009  . Apnea, sleep 07/19/2009  . ANEMIA 07/10/2008  . Essential hypertension 07/08/2008  . DM w/o complication type II ([place]) 06/11/2008  . NEUROPATHY 01/22/2008  . Asthma 01/22/2008  . Mononeuritis 01/22/2008   Current Outpatient Prescriptions on File Prior to Visit  Medication Sig Dispense Refill  . acetaminophen (TYLENOL) 500 MG tablet Take 1,000 mg by mouth every 6 (six) hours as needed (for headache/pain.).    Marland Kitchen albuterol (PROVENTIL HFA;VENTOLIN HFA) 108 (90 BASE) MCG/ACT inhaler Inhale 2 puffs into the lungs every 6 (six) hours as needed for wheezing or shortness of breath.    Marland Kitchen amLODipine (NORVASC) 10 MG tablet Take 10 mg by mouth at bedtime.     Marland Kitchen atorvastatin (LIPITOR) 80 MG tablet Take 80 mg by mouth daily.    . chlorthalidone (HYGROTON) 25 MG tablet Take 1 tablet (25 mg total) by mouth daily. 30 tablet 3  . fluticasone (FLONASE) 50 MCG/ACT nasal spray Place 1 spray into both nostrils daily as needed (for nasal congestion/sinus infection).     . Insulin Degludec 200 UNIT/ML SOPN Inject 50 Units into the skin at bedtime.    . INVOKAMET 150-500 MG TABS Take 1 tablet by mouth 2 (two) times daily.     Marland Kitchen levETIRAcetam (KEPPRA) 500 MG tablet Take 500 mg by mouth 2 (two) times daily.    Marland Kitchen lisinopril (PRINIVIL,ZESTRIL) 40 MG tablet Take 1 tablet (40 mg total) by mouth daily. 30 tablet 3  . loratadine (CLARITIN) 10 MG tablet Take 10 mg by mouth daily as needed for allergies.     Marland Kitchen  montelukast (SINGULAIR) 10 MG tablet Take 10 mg by mouth at bedtime as needed (allergies). Reported on 02/16/2016    . naproxen (NAPROSYN) 500 MG tablet Take 1 tablet (500 mg total) by mouth 2 (two) times daily. 30 tablet 0  . NOVOLOG FLEXPEN 100 UNIT/ML FlexPen Inject 14 Units into the skin 3 (three) times daily as needed for high blood sugar. Takes if sugar > 200    . ondansetron (ZOFRAN ODT) 4 MG disintegrating tablet 49m ODT q6 hours prn nausea/vomit (Patient  not taking: Reported on 02/16/2016) 10 tablet 0  . Rivaroxaban (XARELTO) 20 MG TABS Take 20 mg by mouth at bedtime.     . rizatriptan (MAXALT-MLT) 5 MG disintegrating tablet Take 1 tablet (5 mg total) by mouth as needed for migraine. May repeat in 2 hours if needed (Patient not taking: Reported on 02/16/2016) 15 tablet 12   No current facility-administered medications on file prior to visit.   Allergies  Allergen Reactions  . Codeine Nausea And Vomiting    Pt takes percocet without problems   . Darvocet [Propoxyphene N-Acetaminophen] Nausea And Vomiting    vomiting  . Hydrocodone-Acetaminophen Nausea And Vomiting    Vomiting   . Latex Rash    rash  . Tramadol Nausea Only    Recent Results (from the past 2160 hour(s))  Basic metabolic panel     Status: Abnormal   Collection Time: 02/15/16  3:58 PM  Result Value Ref Range   Sodium 137 135 - 145 mmol/L   Potassium 3.6 3.5 - 5.1 mmol/L   Chloride 92 (L) 101 - 111 mmol/L   CO2 31 22 - 32 mmol/L   Glucose, Bld 273 (H) 65 - 99 mg/dL   BUN 15 6 - 20 mg/dL   Creatinine, Ser 1.05 (H) 0.44 - 1.00 mg/dL   Calcium 10.0 8.9 - 10.3 mg/dL   GFR calc non Af Amer >60 >60 mL/min   GFR calc Af Amer >60 >60 mL/min    Comment: (NOTE) The eGFR has been calculated using the CKD EPI equation. This calculation has not been validated in all clinical situations. eGFR's persistently <60 mL/min signify possible Chronic Kidney Disease.    Anion gap 14 5 - 15  CBC     Status: Abnormal   Collection Time: 02/15/16  3:58 PM  Result Value Ref Range   WBC 13.9 (H) 4.0 - 10.5 K/uL   RBC 5.80 (H) 3.87 - 5.11 MIL/uL   Hemoglobin 15.0 12.0 - 15.0 g/dL   HCT 45.5 36.0 - 46.0 %   MCV 78.4 78.0 - 100.0 fL   MCH 25.9 (L) 26.0 - 34.0 pg   MCHC 33.0 30.0 - 36.0 g/dL   RDW 14.8 11.5 - 15.5 %   Platelets 334 150 - 400 K/uL  [facility]     Status: None   Collection Time: 02/15/16  4:09 PM  Result Value Ref Range   Troponin i, poc 0.00  0.00 - 0.08 ng/mL   Comment 3            Comment: Due to the release kinetics of cTnI, a negative result within the first hours of the onset of symptoms does not rule out myocardial infarction with certainty. If myocardial infarction is still suspected, repeat the test at appropriate intervals.   I-stat troponin, ED     Status: None   Collection Time: 02/16/16 12:36 AM  Result Value Ref Range   Troponin i, poc 0.00  0.00 - 0.08 ng/mL   Comment 3            Comment: Due to the release kinetics of cTnI, a negative result within the first hours of the onset of symptoms does not rule out myocardial infarction with certainty. If myocardial infarction is still suspected, repeat the test at appropriate intervals.     Objective: General: Patient is awake, alert, and oriented x 3 and in no acute distress.  Integument: Skin is warm, dry and supple bilateral. Nails are tender, long, thickened and  dystrophic with subungual debris, consistent with onychomycosis, 1-5 bilateral. No signs of infection. No open lesions or preulcerative lesions present bilateral. Remaining integument unremarkable.  Vasculature:  Dorsalis Pedis pulse 2/4 bilateral. Posterior Tibial pulse  2/4 bilateral.  Capillary fill time <3 sec 1-5 bilateral. Positive hair growth to the level of the digits. Temperature gradient within normal limits. No varicosities present bilateral. No edema present bilateral.   Neurology: The patient has intact sensation measured with a 5.07/10g Semmes Weinstein Monofilament at all pedal sites bilateral . Vibratory sensation slightly diminished bilateral with tuning fork. No Babinski sign present bilateral.   Musculoskeletal: Mild tenderness to insertion of achilles with bony exostosis left>right heel. No gap or dell. Thompson test negative. No other symptomatic bony deformities. Muscular strength 5/5 in all lower extremity muscular groups bilateral without pain on range of motion . No  tenderness with calf compression bilateral.  Assessment and Plan: Problem List Items Addressed This Visit    None    Visit Diagnoses    Pain due to onychomycosis of toenail    -  Primary    Achilles tendonitis, bilateral        L>R    Diabetes mellitus without complication ([place])           -Examined patient. -Discussed and educated patient on diabetic foot care, especially with  regards to the vascular, neurological and musculoskeletal systems.  -Stressed the importance of good glycemic control and the detriment of not  controlling glucose levels in relation to the foot. -Mechanically debrided all nails 1-5 bilateral using sterile nail nipper and filed with dremel without incident  -Advised gentle stretching and icing bilateral heels daily -Gave felt heel lifts and recommend shoes that will prevent irritation at heels -Answered all patient questions -Patient to return  in 3 months for at risk foot care -Patient advised to call the office if any problems or questions arise in the meantime.  Landis Martins, DPM

## 2016-05-02 ENCOUNTER — Ambulatory Visit: Payer: Medicare Other | Admitting: Pharmacist Clinician (PhC)/ Clinical Pharmacy Specialist

## 2016-05-09 ENCOUNTER — Ambulatory Visit (INDEPENDENT_AMBULATORY_CARE_PROVIDER_SITE_OTHER): Payer: Medicare Other | Admitting: Pharmacist

## 2016-05-09 ENCOUNTER — Encounter: Payer: Self-pay | Admitting: Pharmacist Clinician (PhC)/ Clinical Pharmacy Specialist

## 2016-05-09 DIAGNOSIS — I10 Essential (primary) hypertension: Secondary | ICD-10-CM

## 2016-05-09 LAB — BASIC METABOLIC PANEL
BUN: 9 mg/dL (ref 7–25)
CO2: 26 mmol/L (ref 20–31)
Calcium: 9.7 mg/dL (ref 8.6–10.2)
Chloride: 100 mmol/L (ref 98–110)
Creat: 0.77 mg/dL (ref 0.50–1.10)
Glucose, Bld: 211 mg/dL — ABNORMAL HIGH (ref 65–99)
POTASSIUM: 4.2 mmol/L (ref 3.5–5.3)
SODIUM: 138 mmol/L (ref 135–146)

## 2016-05-09 NOTE — Assessment & Plan Note (Signed)
BP is at goal today. No medication changes. Will have her have follow-up labs today since previously had a slight bump in SCr. Encouraged her to check her pressures at home and bring cuff with her to next visit so that we can check reliability of cuff. Follow-up in hypertension clinic in 2 months.

## 2016-05-09 NOTE — Progress Notes (Signed)
05/09/16 Makayla Frazier 1971/09/13 [id number]   HPI: Makayla Frazier is a 45 y.o. female patient of Dr Oval Linsey, with a PMH below who presents today for hypertension clinic follow up. When she originally came from Dr. Oval Linsey she was on lisinopril hctz 40/25 and amlodipine 10 mg - she took all in the mornings. Makayla Frazier previously switched the amlodipine to evenings, the lisinopril hctz 40/25 to lisinopril 40 mg and chlorthalidone 25 mg.Today she reports to feeling well, no side effects from medications and states compliant with daily regimen. She has had no symptoms of low blood pressure, but does endorse headaches though she states she does have a history of migraines.   BP goal <140/90  Cardiac Hx: HTN, TIA, OSA, PE x2 (2010, 2013)  Family Hx: both parents with hypertension and DM  Social Hx: no tobacco, occasional wine and caffeine (tea)  Diet: does not add salt, has been working to decrease intake for weight loss  Exercise: treadmill for 30 minutes 3 x per week  Home BP readings: Has a relion cuff but does not believe it is accurate  Current antihypertensive medications: amlodipine 10 mg qpm, lisinopril 40 mg qam, chlorthalidone 25 mg qam   Wt Readings from Last 3 Encounters:  05/09/16 163 lb 6.4 oz (74.118 kg)  03/06/16 163 lb 6.4 oz (74.118 kg)  02/13/16 167 lb 14.4 oz (76.159 kg)   BP Readings from Last 3 Encounters:  05/09/16 132/80  03/06/16 140/88  02/16/16 156/101   Pulse Readings from Last 3 Encounters:  03/06/16 76  02/16/16 95  02/13/16 80    Renal function: CrCl cannot be calculated (Patient has no serum creatinine result on file.).  Past Medical History  Diagnosis Date  . PE (pulmonary embolism)     2009 - on oral contraception; multiple  . Syncopal episodes     unknown etiology  . Dysfunctional uterine bleeding     Seen by Dr. Leo Grosser, GYN; pending hysterectomy as of 07/2012  . Asthma   . Obesity   . Mastodynia   . Hypertension   . Post  - coital bleeding 2012  . Labial lesion 2013  . BV (bacterial vaginosis) 2012  . Oligomenorrhea 2011  . DVT (deep venous thrombosis) ([place]) 2011    pt had IUD; also 05/2012  . H/O hypercoagulable state     2/2 contraception  . HLD (hyperlipidemia)   . DM (diabetes mellitus) ([place])     diagnosed 2009  . Anemia   . Herpes simplex without mention of complication 0000000    HSV-2  . Thyroid disease     hypothyroidism (pt denies)  . Vitamin D deficiency     unknown to pt.  . Major depression ([place])     Hx suicide attempt in 2009, [facility] admissions  . Venous insufficiency   . Sleep apnea   . Neuropathy ([place])   . Stroke [facility])     TIA 2013  . Headache(784.0)     migraines  . Neuromuscular disorder ([facility])     neuropathy  . Arthritis     Left leg  . Status post placement of implantable loop recorder   . Complication of anesthesia     Seizures in PACU after surgery 3/15  . Seizures ([place]) 2015    last episode May 2015  . Infection     UTI  . Vaginal Pap smear, abnormal   . SORE THROAT 08/10/2010    Qualifier: Diagnosis of  By: Vanessa Kick MD, Saralyn Pilar  Current Outpatient Prescriptions on File Prior to Visit  Medication Sig Dispense Refill  . acetaminophen (TYLENOL) 500 MG tablet Take 1,000 mg by mouth every 6 (six) hours as needed (for headache/pain.).    Marland Kitchen albuterol (PROVENTIL HFA;VENTOLIN HFA) 108 (90 BASE) MCG/ACT inhaler Inhale 2 puffs into the lungs every 6 (six) hours as needed for wheezing or shortness of breath.    Marland Kitchen amLODipine (NORVASC) 10 MG tablet Take 10 mg by mouth at bedtime.     Marland Kitchen atorvastatin (LIPITOR) 80 MG tablet Take 80 mg by mouth daily.    . chlorthalidone (HYGROTON) 25 MG tablet Take 1 tablet (25 mg total) by mouth daily. 30 tablet 3  . fluticasone (FLONASE) 50 MCG/ACT nasal spray Place 1 spray into both nostrils daily as needed (for nasal congestion/sinus infection).     . Insulin Degludec 200 UNIT/ML SOPN Inject 50 Units into the skin at bedtime.    . INVOKAMET  150-500 MG TABS Take 1 tablet by mouth 2 (two) times daily.     Marland Kitchen levETIRAcetam (KEPPRA) 500 MG tablet Take 500 mg by mouth 2 (two) times daily.    Marland Kitchen lisinopril (PRINIVIL,ZESTRIL) 40 MG tablet Take 1 tablet (40 mg total) by mouth daily. 30 tablet 3  . loratadine (CLARITIN) 10 MG tablet Take 10 mg by mouth daily as needed for allergies.     . montelukast (SINGULAIR) 10 MG tablet Take 10 mg by mouth at bedtime as needed (allergies). Reported on 02/16/2016    . naproxen (NAPROSYN) 500 MG tablet Take 1 tablet (500 mg total) by mouth 2 (two) times daily. 30 tablet 0  . NOVOLOG FLEXPEN 100 UNIT/ML FlexPen Inject 14 Units into the skin 3 (three) times daily as needed for high blood sugar. Takes if sugar > 200    . ondansetron (ZOFRAN ODT) 4 MG disintegrating tablet 4mg  ODT q6 hours prn nausea/vomit (Patient not taking: Reported on 02/16/2016) 10 tablet 0  . Rivaroxaban (XARELTO) 20 MG TABS Take 20 mg by mouth at bedtime.     . rizatriptan (MAXALT-MLT) 5 MG disintegrating tablet Take 1 tablet (5 mg total) by mouth as needed for migraine. May repeat in 2 hours if needed (Patient not taking: Reported on 02/16/2016) 15 tablet 12   No current facility-administered medications on file prior to visit.    Allergies  Allergen Reactions  . Codeine Nausea And Vomiting    Pt takes percocet without problems   . Darvocet [Propoxyphene N-Acetaminophen] Nausea And Vomiting    vomiting  . Hydrocodone-Acetaminophen Nausea And Vomiting    Vomiting   . Latex Rash    rash  . Tramadol Nausea Only     Assessment/Plan: Hypertension: BP is at goal today. No medication changes. Will have her have follow-up labs today since previously had a slight bump in SCr. Encouraged her to check her pressures at home and bring cuff with her to next visit so that we can check reliability of cuff. Follow-up in hypertension clinic in 2 months.    Thank you, Lelan Pons. Patterson Hammersmith, [facility]  05/09/2016 1:11  PM

## 2016-05-09 NOTE — Patient Instructions (Signed)
LABS today.  Return for a a follow up appointment in 2 months in hypertension clinic  Your blood pressure today is 132/80  Check your blood pressure at home daily (if able) and keep record of the readings.  Take your BP meds as follows: Continue same medications as prescribed  Bring all of your meds, your BP cuff and your record of home blood pressures to your next appointment.  Exercise as you're able, try to walk approximately 30 minutes per day.  Keep salt intake to a minimum, especially watch canned and prepared boxed foods.  Eat more fresh fruits and vegetables and fewer canned items.  Avoid eating in fast food restaurants.    HOW TO TAKE YOUR BLOOD PRESSURE: . Rest 5 minutes before taking your blood pressure. .  Don't smoke or drink caffeinated beverages for at least 30 minutes before. . Take your blood pressure before (not after) you eat. . Sit comfortably with your back supported and both feet on the floor (don't cross your legs). . Elevate your arm to heart level on a table or a desk. . Use the proper sized cuff. It should fit smoothly and snugly around your bare upper arm. There should be enough room to slip a fingertip under the cuff. The bottom edge of the cuff should be 1 inch above the crease of the elbow. . Ideally, take 3 measurements at one sitting and record the average.

## 2016-05-25 ENCOUNTER — Other Ambulatory Visit: Payer: Self-pay

## 2016-05-25 ENCOUNTER — Encounter (HOSPITAL_COMMUNITY): Payer: Self-pay | Admitting: Emergency Medicine

## 2016-05-25 ENCOUNTER — Emergency Department (HOSPITAL_COMMUNITY): Payer: Medicare Other

## 2016-05-25 ENCOUNTER — Emergency Department (HOSPITAL_COMMUNITY)
Admission: EM | Admit: 2016-05-25 | Discharge: 2016-05-26 | Disposition: A | Discharge status: Home / Self Care | Payer: Medicare Other | Attending: Emergency Medicine | Admitting: Emergency Medicine

## 2016-05-25 DIAGNOSIS — Z794 Long term (current) use of insulin: Secondary | ICD-10-CM | POA: Insufficient documentation

## 2016-05-25 DIAGNOSIS — R202 Paresthesia of skin: Secondary | ICD-10-CM | POA: Diagnosis not present

## 2016-05-25 DIAGNOSIS — Z7901 Long term (current) use of anticoagulants: Secondary | ICD-10-CM | POA: Insufficient documentation

## 2016-05-25 DIAGNOSIS — R531 Weakness: Secondary | ICD-10-CM | POA: Insufficient documentation

## 2016-05-25 DIAGNOSIS — J45909 Unspecified asthma, uncomplicated: Secondary | ICD-10-CM | POA: Insufficient documentation

## 2016-05-25 DIAGNOSIS — I10 Essential (primary) hypertension: Secondary | ICD-10-CM | POA: Diagnosis not present

## 2016-05-25 DIAGNOSIS — R0789 Other chest pain: Secondary | ICD-10-CM

## 2016-05-25 DIAGNOSIS — Z8673 Personal history of transient ischemic attack (TIA), and cerebral infarction without residual deficits: Secondary | ICD-10-CM | POA: Insufficient documentation

## 2016-05-25 DIAGNOSIS — R079 Chest pain, unspecified: Secondary | ICD-10-CM | POA: Diagnosis not present

## 2016-05-25 DIAGNOSIS — Z91040 Latex allergy status: Secondary | ICD-10-CM | POA: Diagnosis not present

## 2016-05-25 DIAGNOSIS — E1140 Type 2 diabetes mellitus with diabetic neuropathy, unspecified: Secondary | ICD-10-CM | POA: Insufficient documentation

## 2016-05-25 DIAGNOSIS — R29898 Other symptoms and signs involving the musculoskeletal system: Secondary | ICD-10-CM

## 2016-05-25 LAB — I-STAT CHEM 8, ED
BUN: 10 mg/dL (ref 6–20)
CHLORIDE: 101 mmol/L (ref 101–111)
CREATININE: 0.8 mg/dL (ref 0.44–1.00)
Calcium, Ion: 1.05 mmol/L — ABNORMAL LOW (ref 1.12–1.23)
Glucose, Bld: 196 mg/dL — ABNORMAL HIGH (ref 65–99)
HEMATOCRIT: 49 % — AB (ref 36.0–46.0)
HEMOGLOBIN: 16.7 g/dL — AB (ref 12.0–15.0)
POTASSIUM: 3.8 mmol/L (ref 3.5–5.1)
SODIUM: 139 mmol/L (ref 135–145)
TCO2: 26 mmol/L (ref 0–100)

## 2016-05-25 LAB — DIFFERENTIAL
BASOS ABS: 0 10*3/uL (ref 0.0–0.1)
BASOS PCT: 0 %
EOS ABS: 0.1 10*3/uL (ref 0.0–0.7)
Eosinophils Relative: 1 %
Lymphocytes Relative: 48 %
Lymphs Abs: 4.6 10*3/uL — ABNORMAL HIGH (ref 0.7–4.0)
Monocytes Absolute: 0.6 10*3/uL (ref 0.1–1.0)
Monocytes Relative: 6 %
NEUTROS PCT: 45 %
Neutro Abs: 4.4 10*3/uL (ref 1.7–7.7)

## 2016-05-25 LAB — URINE MICROSCOPIC-ADD ON

## 2016-05-25 LAB — COMPREHENSIVE METABOLIC PANEL
ALBUMIN: 3.9 g/dL (ref 3.5–5.0)
ALT: 23 U/L (ref 14–54)
AST: 21 U/L (ref 15–41)
Alkaline Phosphatase: 55 U/L (ref 38–126)
Anion gap: 11 (ref 5–15)
BUN: 7 mg/dL (ref 6–20)
CHLORIDE: 101 mmol/L (ref 101–111)
CO2: 23 mmol/L (ref 22–32)
Calcium: 9.4 mg/dL (ref 8.9–10.3)
Creatinine, Ser: 0.82 mg/dL (ref 0.44–1.00)
GFR calc Af Amer: >60 mL/min (ref >60)
GFR calc non Af Amer: >60 mL/min (ref >60)
GLUCOSE: 193 mg/dL — AB (ref 65–99)
POTASSIUM: 3.9 mmol/L (ref 3.5–5.1)
Sodium: 135 mmol/L (ref 135–145)
Total Bilirubin: 0.4 mg/dL (ref 0.3–1.2)
Total Protein: 7.8 g/dL (ref 6.5–8.1)

## 2016-05-25 LAB — I-STAT TROPONIN, ED
Troponin i, poc: 0 ng/mL (ref 0.00–0.08)
Troponin i, poc: 0.01 ng/mL (ref 0.00–0.08)

## 2016-05-25 LAB — RAPID URINE DRUG SCREEN, HOSP PERFORMED
Amphetamines: NOT DETECTED
BARBITURATES: NOT DETECTED
BENZODIAZEPINES: NOT DETECTED
COCAINE: NOT DETECTED
Opiates: NOT DETECTED
TETRAHYDROCANNABINOL: NOT DETECTED

## 2016-05-25 LAB — URINALYSIS, ROUTINE W REFLEX MICROSCOPIC
Bilirubin Urine: NEGATIVE
HGB URINE DIPSTICK: NEGATIVE
KETONES UR: 15 mg/dL — AB
Leukocytes, UA: NEGATIVE
Nitrite: NEGATIVE
PROTEIN: NEGATIVE mg/dL
Specific Gravity, Urine: 1.017 (ref 1.005–1.030)
pH: 6.5 (ref 5.0–8.0)

## 2016-05-25 LAB — CBC
HCT: 42.9 % (ref 36.0–46.0)
HEMOGLOBIN: 14 g/dL (ref 12.0–15.0)
MCH: 25.2 pg — ABNORMAL LOW (ref 26.0–34.0)
MCHC: 32.6 g/dL (ref 30.0–36.0)
MCV: 77.3 fL — ABNORMAL LOW (ref 78.0–100.0)
Platelets: 308 10*3/uL (ref 150–400)
RBC: 5.55 MIL/uL — ABNORMAL HIGH (ref 3.87–5.11)
RDW: 14.9 % (ref 11.5–15.5)
WBC: 9.6 10*3/uL (ref 4.0–10.5)

## 2016-05-25 LAB — APTT: APTT: 27 s (ref 24–37)

## 2016-05-25 LAB — PROTIME-INR
INR: 1.03 (ref 0.00–1.49)
Prothrombin Time: 13.7 seconds (ref 11.6–15.2)

## 2016-05-25 LAB — ETHANOL

## 2016-05-25 LAB — CBG MONITORING, ED: Glucose-Capillary: 189 mg/dL — ABNORMAL HIGH (ref 65–99)

## 2016-05-25 MED ORDER — LORAZEPAM 2 MG/ML IJ SOLN
1.0000 mg | Freq: Once | INTRAMUSCULAR | Status: AC
  Administered 2016-05-25: 1 mg via INTRAVENOUS
  Filled 2016-05-25: qty 1

## 2016-05-25 MED ORDER — MORPHINE SULFATE (PF) 4 MG/ML IV SOLN
4.0000 mg | Freq: Once | INTRAVENOUS | Status: AC
  Administered 2016-05-25: 4 mg via INTRAVENOUS
  Filled 2016-05-25: qty 1

## 2016-05-25 MED ORDER — ASPIRIN 81 MG PO CHEW
324.0000 mg | CHEWABLE_TABLET | Freq: Once | ORAL | Status: AC
Start: 2016-05-25 — End: 2016-05-25
  Administered 2016-05-25: 324 mg via ORAL
  Filled 2016-05-25: qty 4

## 2016-05-25 NOTE — Consult Note (Signed)
Neurology Consultation Reason for Consult: Stroke Referring Physician: Dr Darl Householder   HPI: Makayla Frazier is a 45 y.o. female with history of multiple vascular risk factors including hypertension, hyperlipidemia, diabetes mellitus, pulmonary embolism on xarelto came in with chest pain and left upper extremity weakness and numbness. Patient states her symptoms started earlier in the day almost 12 hours ago. Current EMS her blood pressure was recorded to 230/100 Currently she is alert and oriented she complains of mild chest pain and left upper extremity weakness and numbness mostly hand grip. CT scan of the head did not show any acute abnormalities, brain MRI was also performed and reviewed by me which does not show any new stroke Currently denies any fever or chills night sweats headache no change in vision, no change in bowel or urinary habits  Past Medical History  Diagnosis Date  . PE (pulmonary embolism)     2009 - on oral contraception; multiple  . Syncopal episodes     unknown etiology  . Dysfunctional uterine bleeding     Seen by Dr. Leo Grosser, GYN; pending hysterectomy as of 07/2012  . Asthma   . Obesity   . Mastodynia   . Hypertension   . Post - coital bleeding 2012  . Labial lesion 2013  . BV (bacterial vaginosis) 2012  . Oligomenorrhea 2011  . DVT (deep venous thrombosis) ([place]) 2011    pt had IUD; also 05/2012  . H/O hypercoagulable state     2/2 contraception  . HLD (hyperlipidemia)   . DM (diabetes mellitus) ([place])     diagnosed 2009  . Anemia   . Herpes simplex without mention of complication 0000000    HSV-2  . Thyroid disease     hypothyroidism (pt denies)  . Vitamin D deficiency     unknown to pt.  . Major depression ([place])     Hx suicide attempt in 2009, [facility] admissions  . Venous insufficiency   . Sleep apnea   . Neuropathy ([place])   . Stroke [facility])     TIA  2013  . Headache(784.0)     migraines  . Neuromuscular disorder (HCC)     neuropathy  . Arthritis     Left leg  . Status post placement of implantable loop recorder   . Complication of anesthesia     Seizures in PACU after surgery 3/15  . Seizures ([place]) 2015    last episode May 2015  . Infection     UTI  . Vaginal Pap smear, abnormal   . SORE THROAT 08/10/2010    Qualifier: Diagnosis of By: Vanessa Kick MD, Saralyn Pilar     Current Outpatient Prescriptions on File Prior to Visit  Medication Sig Dispense Refill  . acetaminophen (TYLENOL) 500 MG tablet Take 1,000 mg by mouth every 6 (six) hours as needed (for headache/pain.).    Marland Kitchen albuterol (PROVENTIL HFA;VENTOLIN HFA) 108 (90 BASE) MCG/ACT inhaler Inhale 2 puffs into the lungs every 6 (six) hours as needed for wheezing or shortness of breath.    Marland Kitchen amLODipine (NORVASC) 10 MG tablet Take 10 mg by mouth at bedtime.     Marland Kitchen atorvastatin (LIPITOR) 80 MG tablet Take 80 mg by mouth daily.    . chlorthalidone (HYGROTON) 25 MG tablet Take 1 tablet (25 mg total) by mouth daily. 30 tablet 3  . fluticasone (FLONASE) 50 MCG/ACT nasal spray Place 1 spray into both nostrils daily as needed (for nasal congestion/sinus infection).     . Insulin Degludec 200 UNIT/ML  SOPN Inject 50 Units into the skin at bedtime.    . INVOKAMET 150-500 MG TABS Take 1 tablet by mouth 2 (two) times daily.     Marland Kitchen levETIRAcetam (KEPPRA) 500 MG tablet Take 500 mg by mouth 2 (two) times daily.    Marland Kitchen lisinopril (PRINIVIL,ZESTRIL) 40 MG tablet Take 1 tablet (40 mg total) by mouth daily. 30 tablet 3  . loratadine (CLARITIN) 10 MG tablet Take 10 mg by mouth daily as needed for allergies.     . montelukast (SINGULAIR) 10 MG tablet Take 10 mg by mouth at bedtime as needed (allergies). Reported on 02/16/2016    . naproxen (NAPROSYN) 500 MG tablet Take 1 tablet (500 mg total) by  mouth 2 (two) times daily. 30 tablet 0  . NOVOLOG FLEXPEN 100 UNIT/ML FlexPen Inject 14 Units into the skin 3 (three) times daily as needed for high blood sugar. Takes if sugar > 200    . ondansetron (ZOFRAN ODT) 4 MG disintegrating tablet 4mg  ODT q6 hours prn nausea/vomit (Patient not taking: Reported on 02/16/2016) 10 tablet 0  . Rivaroxaban (XARELTO) 20 MG TABS Take 20 mg by mouth at bedtime.     . rizatriptan (MAXALT-MLT) 5 MG disintegrating tablet Take 1 tablet (5 mg total) by mouth as needed for migraine. May repeat in 2 hours if needed (Patient not taking: Reported on 02/16/2016) 15 tablet 12   No current facility-administered medications on file prior to visit.    Allergies  Allergen Reactions  . Codeine Nausea And Vomiting    Pt takes percocet without problems   . Darvocet [Propoxyphene N-Acetaminophen] Nausea And Vomiting    vomiting  . Hydrocodone-Acetaminophen Nausea And Vomiting    Vomiting   . Latex Rash    rash  . Tramadol        Family history Hypertension Diabetes mellitus Coronary artery disease  Social history No history of tobacco: Drug abuse  Review of systems as mentioned in history of present illness  Exam: Current vital signs: BP 149/90 mmHg  Pulse 93  Temp(Src) 99 F (37.2 C) (Oral)  Resp 20  Wt 76.1 kg (167 lb 12.3 oz)  SpO2 99%  LMP 12/19/2011 Vital signs in last 24 hours: Temp:  [99 F (37.2 C)-99.3 F (37.4 C)] 99 F (37.2 C) (06/15 2117) Pulse Rate:  [91-104] 93 (06/15 2117) Resp:  [8-20] 20 (06/15 2117) BP: (149-208)/(90-130) 149/90 mmHg (06/15 2117) SpO2:  [98 %-100 %] 99 % (06/15 2117) Weight:  [76.1 kg (167 lb 12.3 oz)] 76.1 kg (167 lb 12.3 oz) (06/15 1939)  Physical Exam  Constitutional: Appears well-developed and well-nourished.  Psych: Affect appropriate to situation Eyes: No scleral injection HENT: No OP obstrucion Head: Normocephalic.  Cardiovascular: Normal rate  and regular rhythm.  Respiratory: Effort normal and breath sounds normal to anterior ascultation GI: Soft.  No distension. There is no tenderness.  Skin: WDI  Neuro: Mental Status: Patient is awake, alert, oriented to person, place, month, year, and situation. Patient is able to give a clear and coherent history. No signs of aphasia or neglect Cranial Nerves: II: Visual Fields are full. Pupils are equal, round, and reactive to light.   III,IV, VI: EOMI without ptosis or diploplia.  V: Facial sensation is symmetric to temperature VII: Facial movement is symmetric.  VIII: hearing is intact to voice X: Uvula elevates symmetrically XI: Shoulder shrug is symmetric. XII: tongue is midline without atrophy or fasciculations.  Motor: Tone is normal. Bulk is normal. Motor exam is  overall okay except very mild weakness and numbness in left upper extremity mostly in her hand Sensory: Sensation is symmetric to light touch and temperature in the arms and legs. Deep Tendon Reflexes: Diminished Plantars: Toes are downgoing bilaterally.  Cerebellar: FNF are intact bilaterally    I have reviewed labs in epic and the results pertinent to this consultation are: MRI brain reviewed personally by me negative for any acute abnormalities   Assessment and recommendation  Makayla Frazier is a 45 y.o. female with history of multiple vascular risk factors including hypertension, hyperlipidemia, diabetes mellitus, pulmonary embolism on xarelto, seizure disorder came in with chest pain and left upper extremity weakness and numbness. MRI of the brain is negative for acute stroke. The exact etiology of patient's symptoms is unknown. Recommend chest pain workup. Continue xarelto and statin therapy for long-term stroke prevention. Please feel free to call neurology with any questions or concerns    Lurena Joiner, MD 10:16 PM

## 2016-05-25 NOTE — ED Notes (Signed)
Patient transported to X-ray 

## 2016-05-25 NOTE — ED Notes (Signed)
Patient transported to MRI 

## 2016-05-25 NOTE — ED Provider Notes (Signed)
CSN: [id number]     Arrival date & time 05/25/16  1920 History   First MD Initiated Contact with Patient 05/25/16 1922     Chief Complaint  Patient presents with  . Code Stroke  . Chest Pain    An emergency department physician performed an initial assessment on this suspected stroke patient at 61. (Consider location/radiation/quality/duration/timing/severity/associated sxs/prior Treatment) The history is provided by the patient.  MAIANA HUFFMASTER is a 45 y.o. female history of PE on xarelto, obesity, HTN here with chest pain, left arm weakness. She started having left-sided chest pain around 12:30 PM today. She then was doing dishes around that time and noticed left arm was weak. She states that the chest pain remained on the left side and denies any radiation to the back or her abdomen. She denies any shortness of breath. She came by EMS and code stroke was activated in the field. She is seen by neurology on arrival.   Past Medical History  Diagnosis Date  . PE (pulmonary embolism)     2009 - on oral contraception; multiple  . Syncopal episodes     unknown etiology  . Dysfunctional uterine bleeding     Seen by Dr. Leo Grosser, GYN; pending hysterectomy as of 07/2012  . Asthma   . Obesity   . Mastodynia   . Hypertension   . Post - coital bleeding 2012  . Labial lesion 2013  . BV (bacterial vaginosis) 2012  . Oligomenorrhea 2011  . DVT (deep venous thrombosis) ([place]) 2011    pt had IUD; also 05/2012  . H/O hypercoagulable state     2/2 contraception  . HLD (hyperlipidemia)   . DM (diabetes mellitus) ([place])     diagnosed 2009  . Anemia   . Herpes simplex without mention of complication 0000000    HSV-2  . Thyroid disease     hypothyroidism (pt denies)  . Vitamin D deficiency     unknown to pt.  . Major depression ([place])     Hx suicide attempt in 2009, [facility] admissions  . Venous insufficiency   . Sleep apnea   . Neuropathy ([place])   . Stroke [facility])     TIA 2013  .  Headache(784.0)     migraines  . Neuromuscular disorder (HCC)     neuropathy  . Arthritis     Left leg  . Status post placement of implantable loop recorder   . Complication of anesthesia     Seizures in PACU after surgery 3/15  . Seizures ([place]) 2015    last episode May 2015  . Infection     UTI  . Vaginal Pap smear, abnormal   . SORE THROAT 08/10/2010    Qualifier: Diagnosis of  By: Vanessa Kick MD, Saralyn Pilar     Past Surgical History  Procedure Laterality Date  . Breast reduction surgery      Dr. Eugene Garnet [facility] 2011  . Cholecystectomy    . Cardiac electrophysiology study & dft      Implantable Loop recorder; no arrhythmias associated with synocpal spells; loop explanted 07-2013  . Endometrial biopsy  2011  . Dilatation & currettage/hysteroscopy with resectocope  2007 & 2013  . Laparoscopic assisted vaginal hysterectomy  12/18/2012    Procedure: LAPAROSCOPIC ASSISTED VAGINAL HYSTERECTOMY;  Surgeon: Eldred Manges, MD;  Location: [place] [facility];  Service: Gynecology;  Laterality: N/A;  . Iud removal  12/18/2012    Procedure: INTRAUTERINE DEVICE (IUD) REMOVAL;  Surgeon: Eldred Manges, MD;  Location:  [place] [facility];  Service: Gynecology;  Laterality: N/A;  Removed during prep by E. Florene Glen PA  . Bilateral salpingectomy  12/18/2012    Procedure: BILATERAL SALPINGECTOMY;  Surgeon: Eldred Manges, MD;  Location: [place] [facility];  Service: Gynecology;  Laterality: Bilateral;  . Bladder suspension N/A 02/13/2014    Procedure: TRANSVAGINAL TAPE (TVT) PROCEDURE;  Surgeon: Delice Lesch, MD;  Location: [place] [facility];  Service: Gynecology;  Laterality: N/A;  . Wisdom tooth extraction    . Transvaginal tape (tvt) removal N/A 04/28/2014    Procedure: Removal of suburethral mesh;  Surgeon: Delice Lesch, MD;  Location: [place] [facility];  Service: Gynecology;  Laterality: N/A;  . Loop recorder explant N/A 07/17/2013    Procedure: LOOP RECORDER EXPLANT;  Surgeon: Thompson Grayer, MD;  Location: [facility] CATH LAB;  Service: Cardiovascular;   Laterality: N/A;   Family History  Problem Relation Age of Onset  . Melanoma Father 21  . Diabetes Father   . Hypertension Father   . Kidney disease Father   . Ulcerative colitis Mother 11  . Diabetes Mother   . Hypertension Mother   . Breast cancer Paternal Grandmother   . Cancer Paternal Grandmother   . Diabetes type II Maternal Grandmother     deceased 12  . Diabetes Maternal Grandmother   . Pulmonary embolism Paternal Grandfather   . Stroke Maternal Grandfather    Social History  Substance Use Topics  . Smoking status: Never Smoker   . Smokeless tobacco: Never Used  . Alcohol Use: Yes     Comment: occasional   OB History    Gravida Para Term Preterm AB TAB SAB Ectopic Multiple Living   0 0             Review of Systems  Cardiovascular: Positive for chest pain.  All other systems reviewed and are negative.     Allergies  Codeine; Darvocet; Hydrocodone-acetaminophen; Latex; and Tramadol  Home Medications   Prior to Admission medications   Medication Sig Start Date End Date Taking? Authorizing Provider  acetaminophen (TYLENOL) 500 MG tablet Take 1,000 mg by mouth every 6 (six) hours as needed (for headache/pain.).   Yes Historical Provider, MD  albuterol (PROVENTIL HFA;VENTOLIN HFA) 108 (90 BASE) MCG/ACT inhaler Inhale 2 puffs into the lungs every 6 (six) hours as needed for wheezing or shortness of breath.   Yes Historical Provider, MD  amLODipine (NORVASC) 10 MG tablet Take 10 mg by mouth at bedtime.    Yes Historical Provider, MD  atorvastatin (LIPITOR) 80 MG tablet Take 80 mg by mouth daily. 12/01/15  Yes Historical Provider, MD  chlorthalidone (HYGROTON) 25 MG tablet Take 1 tablet (25 mg total) by mouth daily. 02/10/16  Yes Skeet Latch, MD  fluticasone (FLONASE) 50 MCG/ACT nasal spray Place 1 spray into both nostrils daily as needed (for nasal congestion/sinus infection).  10/18/15  Yes Historical Provider, MD  Insulin Degludec (TRESIBA FLEXTOUCH [place]) Inject  70 Units into the skin 2 (two) times daily.   Yes Historical Provider, MD  INVOKAMET 150-500 MG TABS Take 1 tablet by mouth 2 (two) times daily.  09/25/14  Yes Historical Provider, MD  levETIRAcetam (KEPPRA) 500 MG tablet Take 1 tablet by mouth 2 (two) times daily. 02/28/16 02/27/17 Yes Historical Provider, MD  lisinopril (PRINIVIL,ZESTRIL) 40 MG tablet Take 1 tablet (40 mg total) by mouth daily. 02/10/16  Yes Skeet Latch, MD  loratadine (CLARITIN) 10 MG tablet Take 10 mg by mouth daily as needed for allergies.  08/04/13  Yes Historical Provider, MD  montelukast (SINGULAIR) 10 MG tablet Take 10 mg by mouth at bedtime as needed (allergies). Reported on 02/16/2016 04/13/15  Yes Historical Provider, MD  NOVOLOG FLEXPEN 100 UNIT/ML FlexPen Inject 14 Units into the skin 3 (three) times daily as needed for high blood sugar. Takes if sugar > 200 01/08/14  Yes Historical Provider, MD  ondansetron (ZOFRAN ODT) 4 MG disintegrating tablet 4mg  ODT q6 hours prn nausea/vomit 12/09/15  Yes Etta Quill, NP  Rivaroxaban (XARELTO) 20 MG TABS Take 20 mg by mouth at bedtime.    Yes Historical Provider, MD  rizatriptan (MAXALT-MLT) 5 MG disintegrating tablet Take 1 tablet (5 mg total) by mouth as needed for migraine. May repeat in 2 hours if needed 05/07/14  Yes Marcial Pacas, MD  levETIRAcetam (KEPPRA) 500 MG tablet Take 500 mg by mouth 2 (two) times daily. 01/14/15 02/16/16  Historical Provider, MD  naproxen (NAPROSYN) 500 MG tablet Take 1 tablet (500 mg total) by mouth 2 (two) times daily. 02/16/16   Orpah Greek, MD   BP 153/91 mmHg  Pulse 86  Temp(Src) 99 F (37.2 C) (Oral)  Resp 15  Wt 167 lb 12.3 oz (76.1 kg)  SpO2 98%  LMP 12/19/2011 Physical Exam  Constitutional: She is oriented to person, place, and time.  Uncomfortable   HENT:  Head: Normocephalic.  Mouth/Throat: Oropharynx is clear and moist.  Eyes: Conjunctivae are normal. Pupils are equal, round, and reactive to light.  Neck: Normal range of motion.  Neck supple.  Cardiovascular: Normal rate, regular rhythm and normal heart sounds.   Pulmonary/Chest: Effort normal and breath sounds normal. No respiratory distress. She has no wheezes. She has no rales.  Abdominal: Soft. Bowel sounds are normal. She exhibits no distension. There is no tenderness. There is no rebound.  Musculoskeletal: Normal range of motion. She exhibits no edema or tenderness.  Good peripheral pulses bilaterally   Neurological: She is alert and oriented to person, place, and time.  Strength 4/5 L hand grasp. Nl strength bilateral legs   Skin: Skin is warm and dry.  Psychiatric: She has a normal mood and affect. Her behavior is normal. Judgment and thought content normal.  Nursing note and vitals reviewed.   ED Course  Procedures (including critical care time) Labs Review Labs Reviewed  CBC - Abnormal; Notable for the following:    RBC 5.55 (*)    MCV 77.3 (*)    MCH 25.2 (*)    All other components within normal limits  DIFFERENTIAL - Abnormal; Notable for the following:    Lymphs Abs 4.6 (*)    All other components within normal limits  COMPREHENSIVE METABOLIC PANEL - Abnormal; Notable for the following:    Glucose, Bld 193 (*)    All other components within normal limits  URINALYSIS, ROUTINE W REFLEX MICROSCOPIC (NOT AT [facility]) - Abnormal; Notable for the following:    Glucose, UA >1000 (*)    Ketones, ur 15 (*)    All other components within normal limits  URINE MICROSCOPIC-ADD ON - Abnormal; Notable for the following:    Squamous Epithelial / LPF 0-5 (*)    Bacteria, UA RARE (*)    All other components within normal limits  I-STAT CHEM 8, ED - Abnormal; Notable for the following:    Glucose, Bld 196 (*)    Calcium, Ion 1.05 (*)    Hemoglobin 16.7 (*)    HCT 49.0 (*)    All other components within normal limits  CBG MONITORING, ED - Abnormal; Notable for the following:    Glucose-Capillary 189 (*)    All other components within normal limits  ETHANOL   PROTIME-INR  APTT  URINE RAPID DRUG SCREEN, HOSP PERFORMED  I-STAT TROPOININ, ED  I-STAT TROPOININ, ED    Imaging Review Dg Chest 2 View  05/25/2016  CLINICAL DATA:  Left-sided chest pain EXAM: CHEST  2 VIEW COMPARISON:  02/15/2016 FINDINGS: The heart size and mediastinal contours are within normal limits. Both lungs are clear. The visualized skeletal structures are unremarkable. IMPRESSION: No active cardiopulmonary disease. Electronically Signed   By: Inez Catalina M.D.   On: 05/25/2016 20:37   Ct Head Wo Contrast  05/25/2016  CLINICAL DATA:  Code stroke. Acute onset of left-sided weakness and generalized chest pain. Initial encounter. EXAM: CT HEAD WITHOUT CONTRAST TECHNIQUE: Contiguous axial images were obtained from the base of the skull through the vertex without intravenous contrast. COMPARISON:  CT of the head performed 05/17/2013, and MRI of the brain performed 02/03/2013 FINDINGS: There is no evidence of acute infarction, mass lesion, or intra- or extra-axial hemorrhage on CT. Prominence of the sulci suggests mild cortical volume loss. The brainstem and fourth ventricle are within normal limits. The basal ganglia are unremarkable in appearance. The cerebral hemispheres demonstrate grossly normal gray-white differentiation. No mass effect or midline shift is seen. There is no evidence of fracture; visualized osseous structures are unremarkable in appearance. The visualized portions of the orbits are within normal limits. The paranasal sinuses and mastoid air cells are well-aerated. No significant soft tissue abnormalities are seen. IMPRESSION: 1. No acute intracranial pathology seen on CT. 2. Mild cortical volume loss noted. These results were called by telephone at the time of interpretation on 05/25/2016 at 7:39 pm to Dr. Gifford Shave, who verbally acknowledged these results. Electronically Signed   By: Garald Balding M.D.   On: 05/25/2016 19:38   Mr Brain Wo Contrast  05/25/2016  CLINICAL DATA:   Initial evaluation for acute left-sided weakness. EXAM: MRI HEAD WITHOUT CONTRAST TECHNIQUE: Multiplanar, multiecho pulse sequences of the brain and surrounding structures were obtained without intravenous contrast. COMPARISON:  Previous CT from earlier the same day. FINDINGS: Mild cerebral volume loss for patient age. No focal parenchymal signal abnormality identified. No abnormal foci of restricted diffusion to suggest acute intracranial infarct. Gray-white matter differentiation maintained. Major intracranial vascular flow voids are preserved. No acute or chronic intracranial hemorrhage. No areas of chronic infarction. No mass lesion, midline shift, or mass effect. No hydrocephalus. No extra-axial fluid collection. Major dural sinuses are grossly patent. Craniocervical junction within normal limits. Visualized upper cervical spine unremarkable without significant degenerative changes or stenosis. Incidental note made of a partially empty sella, similar to previous studies. No acute abnormality about the globes and orbits. Paranasal sinuses are clear. Appearance of the nasopharynx with mild prominence of the adenoidal soft tissues is stable. No mastoid effusion. Internal auditory canals within normal limits. Mildly heterogeneous and decreased bone marrow signal intensity within the upper cervical spine and clivus, stable from prior. This is nonspecific, but can be seen with underlying obesity, smoking, anemia, or other chronic disease. Scalp soft tissues demonstrate no acute abnormality. IMPRESSION: 1. No acute intracranial infarct or other process identified. 2. Partially empty sella, stable. Finding may be incidental and of no clinical significance, but can be seen in the setting of underlying pseudotumor cerebri. 3. Advanced cerebral atrophy for patient age, stable. Electronically Signed   By: Jeannine Boga M.D.   On:  05/25/2016 22:22   I have personally reviewed and evaluated these images and lab  results as part of my medical decision-making.   EKG Interpretation None     ED ECG REPORT   Date: 05/26/2016  Rate: 98  Rhythm: normal sinus rhythm  QRS Axis: normal  Intervals: normal  ST/T Wave abnormalities: nonspecific ST changes  Conduction Disutrbances:none  Narrative Interpretation:   Old EKG Reviewed: unchanged  I have personally reviewed the EKG tracing and agree with the computerized printout as noted.   MDM   Final diagnoses:  None    CHRISTELL CALDERONE is a 45 y.o. female here with L arm weakness, chest pain. Neuro at bedside and recommend MRI brain. Has frequent ED visits for chest pain and has been diagnosed with chest wall pain before. No known CAD. Hypertensive in the ED and has hx of hypertension. I have low suspicion for dissection and if CXR showed no widened mediastinum, will not need further evaluation. She has good peripheral pulses. She is already on xarelto and has multiple normal CT angios in the last year.   12:03 AM CT head nl, MRI brain showed no acute stroke. Possible pseudotumor but has no headaches. Neurology cleared patient and doesn't want further neuro workup. Second trop neg. Chest pain resolved. BP down to [id number] from [id number] systolic. No signs of hypertensive urgency. Told her to take her BP meds at home. Will dc home.      Wandra Arthurs, MD 05/26/16 [id number]

## 2016-05-25 NOTE — ED Notes (Signed)
Code Stroke page went out @ 1905.

## 2016-05-25 NOTE — ED Notes (Signed)
Pt brought to ED by [facility] from home for code stroke r/o, last seen well today at noon time when she started feeling left side cp and left side weakness, bp for gems 250/120. Pt AO x4 c/o left side cp 10/10.

## 2016-05-25 NOTE — Progress Notes (Signed)
Code Stroke called on 45 y.o female, acute onset left side weakness and "heaviness" per Patient while at home today, LSN 1200. Pt also complains of chest pain since noon. Pertinent history includes  HTN, DM, TIA, Hyperlipidemia, Seizures,  PE X 2, and regular xarelto use. Upon arrival to [facility] Pt taken to Ct scan STAT, reviewed per Neurologist Dr. Gifford Shave as negative for acute abnormalities. CBG 189. NIHSS completed yielding 3 for sensory deficit on left face arm and leg, left leg drift.  Pt not a candidate for TPA as she is well outside the window and currently on xarelto. Stroke canceled per MD.

## 2016-05-26 NOTE — Discharge Instructions (Signed)
Take your meds as prescribed.   You need to take your blood pressure meds in particular as prescribed.   See a neurologist for your weakness.   You may need a stress test if you have persistent chest pain.  Return to Er if you have worse chest pain, shortness of breath, weakness

## 2016-07-04 ENCOUNTER — Ambulatory Visit: Payer: Medicare Other

## 2016-07-10 ENCOUNTER — Ambulatory Visit (INDEPENDENT_AMBULATORY_CARE_PROVIDER_SITE_OTHER): Payer: Medicare Other | Admitting: Pharmacist Clinician (PhC)/ Clinical Pharmacy Specialist

## 2016-07-10 ENCOUNTER — Encounter: Payer: Self-pay | Admitting: Pharmacist Clinician (PhC)/ Clinical Pharmacy Specialist

## 2016-07-10 DIAGNOSIS — I10 Essential (primary) hypertension: Secondary | ICD-10-CM | POA: Diagnosis not present

## 2016-07-10 NOTE — Progress Notes (Signed)
05/09/16 Makayla Frazier 08-13-1971 [id number]   HPI: Makayla Frazier is a 45 y.o. female patient of Dr Oval Linsey, with a PMH below who presents today for hypertension clinic follow up. She saw Tana Coast about 2 months ago and was normotensive.  She was asked to return in 2 months for follow up.  Since then she had a trip to the ER for chest pain and left sided weakness.  Stroke work up was negative and she was released and asked to continue with her current medications.  She has not checked her BP since then.  She does complain of occasional shortness of breath and chest pain, but finds that is usually when she is outside and if she uses her albuterol inhaler prior to going out she does much better.    BP goal <140/90  Cardiac Hx: HTN, TIA, OSA, PE x2 (2010, 2013)  Family Hx: both parents with hypertension and DM  Social Hx: no tobacco, occasional wine and caffeine (tea)  Diet: does not add salt, has been working to decrease intake for weight loss  Exercise: treadmill for 30 minutes 3 x per week  Home BP readings: home cuff was Reli-On but rarely gave any readings, just error messages.  Has only had checked at medical appts and ER visit.    Current antihypertensive medications: amlodipine 10 mg qpm, lisinopril 40 mg qam, chlorthalidone 25 mg qam   Wt Readings from Last 3 Encounters:  07/10/16 163 lb 6.4 oz (74.1 kg)  05/25/16 167 lb 12.3 oz (76.1 kg)  05/09/16 163 lb 6.4 oz (74.1 kg)   BP Readings from Last 3 Encounters:  07/10/16 138/80  05/26/16 142/93  05/09/16 132/80   Pulse Readings from Last 3 Encounters:  07/10/16 72  05/25/16 89  03/06/16 76    Renal function: CrCl cannot be calculated (Patient's most recent lab result is older than the maximum 21 days allowed.).  Past Medical History:  Diagnosis Date  . Anemia   . Arthritis    Left leg  . Asthma   . BV (bacterial vaginosis) 2012  . Complication of anesthesia    Seizures in PACU after surgery 3/15   . DM (diabetes mellitus) ([place])    diagnosed 2009  . DVT (deep venous thrombosis) ([place]) 2011   pt had IUD; also 05/2012  . Dysfunctional uterine bleeding    Seen by Dr. Leo Grosser, GYN; pending hysterectomy as of 07/2012  . H/O hypercoagulable state    2/2 contraception  . Headache(784.0)    migraines  . Herpes simplex without mention of complication 0000000   HSV-2  . HLD (hyperlipidemia)   . Hypertension   . Infection    UTI  . Labial lesion 2013  . Major depression ([place])    Hx suicide attempt in 2009, [facility] admissions  . Mastodynia   . Neuromuscular disorder (HCC)    neuropathy  . Neuropathy ([place])   . Obesity   . Oligomenorrhea 2011  . PE (pulmonary embolism)    2009 - on oral contraception; multiple  . Post - coital bleeding 2012  . Seizures ([place]) 2015   last episode May 2015  . Sleep apnea   . SORE THROAT 08/10/2010   Qualifier: Diagnosis of  By: Vanessa Kick MD, Saralyn Pilar    . Status post placement of implantable loop recorder   . Stroke [facility])    TIA 2013  . Syncopal episodes    unknown etiology  . Thyroid disease    hypothyroidism (pt denies)  .  Vaginal Pap smear, abnormal   . Venous insufficiency   . Vitamin D deficiency    unknown to pt.    Current Outpatient Prescriptions on File Prior to Visit  Medication Sig Dispense Refill  . acetaminophen (TYLENOL) 500 MG tablet Take 1,000 mg by mouth every 6 (six) hours as needed (for headache/pain.).    Marland Kitchen albuterol (PROVENTIL HFA;VENTOLIN HFA) 108 (90 BASE) MCG/ACT inhaler Inhale 2 puffs into the lungs every 6 (six) hours as needed for wheezing or shortness of breath.    Marland Kitchen amLODipine (NORVASC) 10 MG tablet Take 10 mg by mouth at bedtime.     Marland Kitchen atorvastatin (LIPITOR) 80 MG tablet Take 80 mg by mouth daily.    . chlorthalidone (HYGROTON) 25 MG tablet Take 1 tablet (25 mg total) by mouth daily. 30 tablet 3  . fluticasone (FLONASE) 50 MCG/ACT nasal spray Place 1 spray into both nostrils daily as needed (for nasal congestion/sinus  infection).     . Insulin Degludec (TRESIBA FLEXTOUCH [place]) Inject 70 Units into the skin 2 (two) times daily.    . INVOKAMET 150-500 MG TABS Take 1 tablet by mouth 2 (two) times daily.     Marland Kitchen levETIRAcetam (KEPPRA) 500 MG tablet Take 500 mg by mouth 2 (two) times daily.    Marland Kitchen levETIRAcetam (KEPPRA) 500 MG tablet Take 1 tablet by mouth 2 (two) times daily.    Marland Kitchen lisinopril (PRINIVIL,ZESTRIL) 40 MG tablet Take 1 tablet (40 mg total) by mouth daily. 30 tablet 3  . loratadine (CLARITIN) 10 MG tablet Take 10 mg by mouth daily as needed for allergies.     . montelukast (SINGULAIR) 10 MG tablet Take 10 mg by mouth at bedtime as needed (allergies). Reported on 02/16/2016    . naproxen (NAPROSYN) 500 MG tablet Take 1 tablet (500 mg total) by mouth 2 (two) times daily. 30 tablet 0  . NOVOLOG FLEXPEN 100 UNIT/ML FlexPen Inject 14 Units into the skin 3 (three) times daily as needed for high blood sugar. Takes if sugar > 200    . ondansetron (ZOFRAN ODT) 4 MG disintegrating tablet 4mg  ODT q6 hours prn nausea/vomit 10 tablet 0  . Rivaroxaban (XARELTO) 20 MG TABS Take 20 mg by mouth at bedtime.     . rizatriptan (MAXALT-MLT) 5 MG disintegrating tablet Take 1 tablet (5 mg total) by mouth as needed for migraine. May repeat in 2 hours if needed 15 tablet 12   No current facility-administered medications on file prior to visit.     Allergies  Allergen Reactions  . Codeine Nausea And Vomiting    Pt takes percocet without problems   . Darvocet [Propoxyphene N-Acetaminophen] Nausea And Vomiting    vomiting  . Hydrocodone-Acetaminophen Nausea And Vomiting    Vomiting   . Latex Rash    rash  . Tramadol Nausea Only     Thank you, Tommy Medal, PharmD CPP [place]  07/10/2016 3:42 PM

## 2016-07-10 NOTE — Patient Instructions (Signed)
If you develop problems with your BP being elevated (at pharmacy or other MD offices) please give [place] a call [phone].  Your blood pressure today is 138/80  Check your blood pressure at home daily (if able) and keep record of the readings.  Take your BP meds as follows: continue with all current medications  Bring all of your meds, your BP cuff and your record of home blood pressures to your next appointment.  Exercise as you're able, try to walk approximately 30 minutes per day.  Keep salt intake to a minimum, especially watch canned and prepared boxed foods.  Eat more fresh fruits and vegetables and fewer canned items.  Avoid eating in fast food restaurants.    HOW TO TAKE YOUR BLOOD PRESSURE: . Rest 5 minutes before taking your blood pressure. .  Don't smoke or drink caffeinated beverages for at least 30 minutes before. . Take your blood pressure before (not after) you eat. . Sit comfortably with your back supported and both feet on the floor (don't cross your legs). . Elevate your arm to heart level on a table or a desk. . Use the proper sized cuff. It should fit smoothly and snugly around your bare upper arm. There should be enough room to slip a fingertip under the cuff. The bottom edge of the cuff should be 1 inch above the crease of the elbow. . Ideally, take 3 measurements at one sitting and record the average.

## 2016-07-10 NOTE — Assessment & Plan Note (Signed)
Today her BP again looks good.  She isn't sure what triggered her trip to the ER, states she woke up in the middle of the night with those symptoms and called 911 (however her arrival time at ER was just after 7pm).    Will leave medications unchanged at this time.  Encouraged her to check BP at pharmacy when she can and keep an eye on the readings.  If she finds a good deal on a BP cuff, she was encouraged to get one.  Will see her back should she have any further problems with her BP.

## 2016-07-12 ENCOUNTER — Other Ambulatory Visit: Payer: Self-pay | Admitting: *Deleted

## 2016-07-12 MED ORDER — CHLORTHALIDONE 25 MG PO TABS: 25.0000 mg | ORAL_TABLET | Freq: Every day | ORAL | 3 refills | Status: DC

## 2016-07-31 ENCOUNTER — Ambulatory Visit: Payer: Medicare Other | Admitting: Sports Medicine

## 2016-08-21 DIAGNOSIS — F411 Generalized anxiety disorder: Secondary | ICD-10-CM | POA: Insufficient documentation

## 2016-08-21 DIAGNOSIS — F321 Major depressive disorder, single episode, moderate: Secondary | ICD-10-CM | POA: Insufficient documentation

## 2016-10-18 ENCOUNTER — Encounter (HOSPITAL_COMMUNITY): Payer: Self-pay | Admitting: Family Medicine

## 2016-10-18 ENCOUNTER — Emergency Department (HOSPITAL_COMMUNITY)
Admission: EM | Admit: 2016-10-18 | Discharge: 2016-10-18 | Disposition: A | Discharge status: Home / Self Care | Payer: Medicare Other | Attending: Emergency Medicine | Admitting: Emergency Medicine

## 2016-10-18 ENCOUNTER — Emergency Department (HOSPITAL_COMMUNITY): Payer: Medicare Other

## 2016-10-18 DIAGNOSIS — E039 Hypothyroidism, unspecified: Secondary | ICD-10-CM | POA: Insufficient documentation

## 2016-10-18 DIAGNOSIS — Z7901 Long term (current) use of anticoagulants: Secondary | ICD-10-CM | POA: Diagnosis not present

## 2016-10-18 DIAGNOSIS — Z8673 Personal history of transient ischemic attack (TIA), and cerebral infarction without residual deficits: Secondary | ICD-10-CM | POA: Insufficient documentation

## 2016-10-18 DIAGNOSIS — Z91040 Latex allergy status: Secondary | ICD-10-CM | POA: Diagnosis not present

## 2016-10-18 DIAGNOSIS — J45909 Unspecified asthma, uncomplicated: Secondary | ICD-10-CM | POA: Insufficient documentation

## 2016-10-18 DIAGNOSIS — I10 Essential (primary) hypertension: Secondary | ICD-10-CM | POA: Diagnosis not present

## 2016-10-18 DIAGNOSIS — Z794 Long term (current) use of insulin: Secondary | ICD-10-CM | POA: Insufficient documentation

## 2016-10-18 DIAGNOSIS — M7989 Other specified soft tissue disorders: Secondary | ICD-10-CM | POA: Diagnosis present

## 2016-10-18 DIAGNOSIS — E1165 Type 2 diabetes mellitus with hyperglycemia: Secondary | ICD-10-CM | POA: Insufficient documentation

## 2016-10-18 DIAGNOSIS — E1140 Type 2 diabetes mellitus with diabetic neuropathy, unspecified: Secondary | ICD-10-CM | POA: Diagnosis not present

## 2016-10-18 LAB — I-STAT CHEM 8, ED
BUN: 6 mg/dL (ref 6–20)
CALCIUM ION: 1.18 mmol/L (ref 1.15–1.40)
CHLORIDE: 100 mmol/L — AB (ref 101–111)
Creatinine, Ser: 0.7 mg/dL (ref 0.44–1.00)
GLUCOSE: 310 mg/dL — AB (ref 65–99)
HCT: 43 % (ref 36.0–46.0)
Hemoglobin: 14.6 g/dL (ref 12.0–15.0)
POTASSIUM: 3.7 mmol/L (ref 3.5–5.1)
Sodium: 138 mmol/L (ref 135–145)
TCO2: 26 mmol/L (ref 0–100)

## 2016-10-18 LAB — D-DIMER, QUANTITATIVE: D-Dimer, Quant: 0.43 ug/mL-FEU (ref 0.00–0.50)

## 2016-10-18 MED ORDER — OXYCODONE HCL 5 MG PO TABS
5.0000 mg | ORAL_TABLET | Freq: Once | ORAL | Status: AC
  Administered 2016-10-18: 5 mg via ORAL
  Filled 2016-10-18: qty 1

## 2016-10-18 MED ORDER — LISINOPRIL 40 MG PO TABS
40.0000 mg | ORAL_TABLET | Freq: Once | ORAL | Status: AC
  Administered 2016-10-18: 40 mg via ORAL
  Filled 2016-10-18: qty 1

## 2016-10-18 NOTE — ED Triage Notes (Signed)
Patient had a Libre Pro Sensor placed to her right upper arm on last Tuesday. About 30 minutes ago, she reports her right forearm started swelling and painful.

## 2016-10-18 NOTE — ED Notes (Signed)
Pt ambulatory and independent at discharge.  Verbalized understanding of discharge instructions 

## 2016-10-18 NOTE — ED Notes (Signed)
Patient transported to X-ray 

## 2016-10-18 NOTE — Discharge Instructions (Signed)
Blood sugar today was elevated at 310. Contact your primary care physician tomorrow to get your arm rechecked within the next 2 days. Elevate your arm above your heart as much as possible and Place an ice pack on your arm 4 times daily for 30 minutes at a time. Elevation and ice will also help with pain. Your blood pressure today was elevated at 153/108. Blood pressure should be rechecked at your doctor's office within a week. Return if concern for any reason

## 2016-10-18 NOTE — ED Provider Notes (Signed)
[place] DEPT Provider Note   CSN: [id number] Arrival date & time: 10/18/16  2052     History   Chief Complaint Chief Complaint  Patient presents with  . Arm Swelling    HPI Makayla Frazier is a 45 y.o. female.  HPI Presents with swelling and pain at right forearm which started 40 minutes prior to coming here. Nothing makes pain better or worse. No other associated symptoms. Denies chest pain or shortness of breath no fever no injury no other associated symptoms. Patient had a Libre pro sensor placed at left upper arm 8 days ago. She has no upper arm pain. No other associated symptoms. Past Medical History:  Diagnosis Date  . Anemia   . Arthritis    Left leg  . Asthma   . BV (bacterial vaginosis) 2012  . Complication of anesthesia    Seizures in PACU after surgery 3/15  . DM (diabetes mellitus) ([place])    diagnosed 2009  . DVT (deep venous thrombosis) ([place]) 2011   pt had IUD; also 05/2012  . Dysfunctional uterine bleeding    Seen by Dr. Leo Grosser, GYN; pending hysterectomy as of 07/2012  . H/O hypercoagulable state    2/2 contraception  . Headache(784.0)    migraines  . Herpes simplex without mention of complication 0000000   HSV-2  . HLD (hyperlipidemia)   . Hypertension   . Infection    UTI  . Labial lesion 2013  . Major depression    Hx suicide attempt in 2009, [facility] admissions  . Mastodynia   . Neuromuscular disorder (HCC)    neuropathy  . Neuropathy ([place])   . Obesity   . Oligomenorrhea 2011  . PE (pulmonary embolism)    2009 - on oral contraception; multiple  . Post - coital bleeding 2012  . Seizures ([place]) 2015   last episode May 2015  . Sleep apnea   . SORE THROAT 08/10/2010   Qualifier: Diagnosis of  By: Vanessa Kick MD, Saralyn Pilar    . Status post placement of implantable loop recorder   . Stroke [facility])    TIA 2013  . Syncopal episodes    unknown etiology  . Thyroid disease    hypothyroidism (pt denies)  . Vaginal Pap smear, abnormal   . Venous  insufficiency   . Vitamin D deficiency    unknown to pt.    Patient Active Problem List   Diagnosis Date Noted  . Reaction to severe stress 03/07/2016  . Bleeding per rectum 11/29/2015  . Acute pansinusitis 10/20/2015  . Candida vaginitis 10/20/2015  . Cerebrovascular accident, late effects 08/30/2015  . D (diarrhea) 08/02/2015  . Seizure disorder ([place]) 07/13/2015  . Adnexal pain 06/08/2015  . Right flank pain 05/30/2015  . Chronic pulmonary embolism ([place]) 05/14/2015  . Long term current use of anticoagulant 05/14/2015  . Seizures ([place]) 05/07/2014  . Absence of bladder continence 03/27/2014  . Arthropathia 02/25/2014  . Chronic pain 02/25/2014  . Healed or old pulmonary embolism 02/25/2014  . HLD (hyperlipidemia) 02/25/2014  . Headache, migraine 02/25/2014  . Obstructive apnea 02/25/2014  . Type 2 diabetes mellitus ([place]) 02/25/2014  . Incontinence 02/13/2014  . S/P vaginal hysterectomy 01/29/2013  . TIA (transient ischemic attack) 08/03/2012  . Syncope 08/03/2012  . Left-sided weakness 08/03/2012  . Hypokalemia 08/03/2012  . Chest pain 08/03/2012  . Temporary cerebral vascular dysfunction 08/03/2012  . Pulmonary embolism ([place]) 05/18/2012  . Patellar tendinitis of left knee 08/21/2011  . Knee pain, left 07/07/2011  .  PRURITUS 07/04/2010  . Syncope and collapse 10/18/2009  . Allergic rhinitis 08/11/2009  . Unspecified vitamin D deficiency 07/21/2009  . Adiposity 07/19/2009  . Apnea, sleep 07/19/2009  . ANEMIA 07/10/2008  . Essential hypertension 07/08/2008  . DM w/o complication type II ([place]) 06/11/2008  . NEUROPATHY 01/22/2008  . Asthma 01/22/2008  . Mononeuritis 01/22/2008    Past Surgical History:  Procedure Laterality Date  . BILATERAL SALPINGECTOMY  12/18/2012   Procedure: BILATERAL SALPINGECTOMY;  Surgeon: Eldred Manges, MD;  Location: [facility];  Service: Gynecology;  Laterality: Bilateral;  . BLADDER SUSPENSION N/A 02/13/2014   Procedure: TRANSVAGINAL TAPE  (TVT) PROCEDURE;  Surgeon: Delice Lesch, MD;  Location: [facility];  Service: Gynecology;  Laterality: N/A;  . BREAST REDUCTION SURGERY     Dr. Eugene Garnet [facility] 2011  . CARDIAC ELECTROPHYSIOLOGY STUDY & DFT     Implantable Loop recorder; no arrhythmias associated with synocpal spells; loop explanted 07-2013  . CHOLECYSTECTOMY    . DILATATION & CURRETTAGE/HYSTEROSCOPY WITH RESECTOCOPE  2007 & 2013  . ENDOMETRIAL BIOPSY  2011  . IUD REMOVAL  12/18/2012   Procedure: INTRAUTERINE DEVICE (IUD) REMOVAL;  Surgeon: Eldred Manges, MD;  Location: [facility];  Service: Gynecology;  Laterality: N/A;  Removed during prep by E. Florene Glen PA  . LAPAROSCOPIC ASSISTED VAGINAL HYSTERECTOMY  12/18/2012   Procedure: LAPAROSCOPIC ASSISTED VAGINAL HYSTERECTOMY;  Surgeon: Eldred Manges, MD;  Location: [facility];  Service: Gynecology;  Laterality: N/A;  . LOOP RECORDER EXPLANT N/A 07/17/2013   Procedure: LOOP RECORDER EXPLANT;  Surgeon: Thompson Grayer, MD;  Location: [facility] CATH LAB;  Service: Cardiovascular;  Laterality: N/A;  . TRANSVAGINAL TAPE (TVT) REMOVAL N/A 04/28/2014   Procedure: Removal of suburethral mesh;  Surgeon: Delice Lesch, MD;  Location: [facility];  Service: Gynecology;  Laterality: N/A;  . WISDOM TOOTH EXTRACTION      OB History    Gravida Para Term Preterm AB Living   0 0           SAB TAB Ectopic Multiple Live Births                   Home Medications    Prior to Admission medications   Medication Sig Start Date End Date Taking? Authorizing Provider  acetaminophen (TYLENOL) 500 MG tablet Take 1,000 mg by mouth every 6 (six) hours as needed (for headache/pain.).   Yes Historical Provider, MD  albuterol (PROVENTIL HFA;VENTOLIN HFA) 108 (90 BASE) MCG/ACT inhaler Inhale 2 puffs into the lungs every 6 (six) hours as needed for wheezing or shortness of breath.   Yes Historical Provider, MD  amLODipine (NORVASC) 10 MG tablet Take 10 mg by mouth daily.   Yes Historical Provider, MD  atorvastatin (LIPITOR) 80  MG tablet Take 80 mg by mouth daily. 12/01/15  Yes Historical Provider, MD  chlorthalidone (HYGROTON) 25 MG tablet Take 1 tablet (25 mg total) by mouth daily. 07/12/16  Yes Skeet Latch, MD  DIALYVITE VITAMIN D3 MAX 16109 units TABS Take 1 tablet by mouth 2 (two) times a week. Monday and thursday 09/13/16  Yes Historical Provider, MD  fluticasone (FLONASE) 50 MCG/ACT nasal spray Place 1 spray into both nostrils daily as needed (for nasal congestion/sinus infection).  10/18/15  Yes Historical Provider, MD  Insulin Degludec (TRESIBA FLEXTOUCH [place]) Inject 100 Units into the skin 2 (two) times daily.    Yes Historical Provider, MD  levETIRAcetam (KEPPRA) 500 MG tablet Take 500 mg by mouth 2 (two) times daily.  01/14/15 10/18/16 Yes Historical Provider, MD  lisinopril (PRINIVIL,ZESTRIL) 40 MG tablet Take 1 tablet (40 mg total) by mouth daily. 02/10/16  Yes Skeet Latch, MD  loratadine (CLARITIN) 10 MG tablet Take 10 mg by mouth daily as needed for allergies.  08/04/13  Yes Historical Provider, MD  montelukast (SINGULAIR) 10 MG tablet Take 10 mg by mouth daily as needed (allergies). Reported on 02/16/2016 04/13/15  Yes Historical Provider, MD  NOVOLOG FLEXPEN 100 UNIT/ML FlexPen Inject 36 Units into the skin 3 (three) times daily as needed for high blood sugar (if over 200). Takes if sugar > 200 01/08/14  Yes Historical Provider, MD  ondansetron (ZOFRAN ODT) 4 MG disintegrating tablet 4mg  ODT q6 hours prn nausea/vomit Patient taking differently: Take 4 mg by mouth every 6 (six) hours as needed for nausea or vomiting. 4mg  ODT q6 hours prn nausea/vomit 12/09/15  Yes Etta Quill, NP  Rivaroxaban (XARELTO) 20 MG TABS Take 20 mg by mouth at bedtime.    Yes Historical Provider, MD  rizatriptan (MAXALT-MLT) 5 MG disintegrating tablet Take 1 tablet (5 mg total) by mouth as needed for migraine. May repeat in 2 hours if needed 05/07/14  Yes Marcial Pacas, MD    Family History Family History  Problem Relation Age of Onset  .  Melanoma Father 80  . Diabetes Father   . Hypertension Father   . Kidney disease Father   . Ulcerative colitis Mother 75  . Diabetes Mother   . Hypertension Mother   . Breast cancer Paternal Grandmother   . Cancer Paternal Grandmother   . Diabetes type II Maternal Grandmother     deceased 52  . Diabetes Maternal Grandmother   . Pulmonary embolism Paternal Grandfather   . Stroke Maternal Grandfather     Social History Social History  Substance Use Topics  . Smoking status: Never Smoker  . Smokeless tobacco: Never Used  . Alcohol use No     Allergies   Codeine; Darvocet [propoxyphene n-acetaminophen]; Hydrocodone-acetaminophen; Latex; and Tramadol   Review of Systems Review of Systems  Constitutional: Negative.   HENT: Negative.   Respiratory: Negative.   Cardiovascular: Negative.   Gastrointestinal: Negative.   Musculoskeletal: Positive for myalgias.       Right forearm pain and swelling  Skin: Negative.   Allergic/Immunologic: Positive for immunocompromised state.       Diabetic  Neurological: Negative.   Psychiatric/Behavioral: Negative.   All other systems reviewed and are negative.    Physical Exam Updated Vital Signs BP (!) 153/108   Pulse 95   Temp 98.8 F (37.1 C) (Oral)   Resp 18   Ht 5' (1.524 m)   Wt 167 lb (75.8 kg)   LMP 12/19/2011   SpO2 99%   BMI 32.61 kg/m   Physical Exam  Constitutional: She is oriented to person, place, and time. She appears well-developed and well-nourished.  HENT:  Head: Normocephalic and atraumatic.  Eyes: Conjunctivae are normal. Pupils are equal, round, and reactive to light.  Neck: Neck supple. No tracheal deviation present. No thyromegaly present.  Cardiovascular: Normal rate and regular rhythm.   No murmur heard. Pulmonary/Chest: Effort normal and breath sounds normal.  Abdominal: Soft. Bowel sounds are normal. She exhibits no distension. There is no tenderness.  Musculoskeletal: Normal range of motion.  She exhibits no edema or tenderness.  Route upper extremity is a plastic disc at the posterior upper arm, taped in place without surrounding redness swelling or tenderness. Forearm is minimally swollen and minimally  tender. Compartments are soft. Upper extremity with Full range of motion. Radial pulse 2+. Good capillary refill. All other extremities without redness or tenderness neurovascularly intact.  Neurological: She is alert and oriented to person, place, and time. Coordination normal.  Skin: Skin is warm and dry. No rash noted.  Psychiatric: She has a normal mood and affect.  Nursing note and vitals reviewed.    ED Treatments / Results  Labs (all labs ordered are listed, but only abnormal results are displayed) Labs Reviewed  I-STAT CHEM 8, ED - Abnormal; Notable for the following:       Result Value   Chloride 100 (*)    Glucose, Bld 310 (*)    All other components within normal limits  D-DIMER, QUANTITATIVE (NOT AT [facility])    EKG  EKG Interpretation None       Radiology Dg Forearm Right  Result Date: 10/18/2016 CLINICAL DATA:  Arm pain with swelling EXAM: RIGHT FOREARM - 2 VIEW COMPARISON:  None. FINDINGS: No fracture or malalignment. No bone destruction or periostitis. No soft tissue gas. IMPRESSION: No acute osseous abnormality Electronically Signed   By: Donavan Foil M.D.   On: 10/18/2016 21:45    Procedures Procedures (including critical care time)  Medications Ordered in ED Medications  oxyCODONE (Oxy IR/ROXICODONE) immediate release tablet 5 mg (5 mg Oral Given 10/18/16 2146)  lisinopril (PRINIVIL,ZESTRIL) tablet 40 mg (40 mg Oral Given 10/18/16 2146)   X-ray viewed by me Results for orders placed or performed during the hospital encounter of 10/18/16  D-dimer, quantitative (not at [facility])  Result Value Ref Range   D-Dimer, Quant 0.43 0.00 - 0.50 ug/mL-FEU  I-stat chem 8, ed  Result Value Ref Range   Sodium 138 135 - 145 mmol/L   Potassium 3.7 3.5 - 5.1  mmol/L   Chloride 100 (L) 101 - 111 mmol/L   BUN 6 6 - 20 mg/dL   Creatinine, Ser 0.70 0.44 - 1.00 mg/dL   Glucose, Bld 310 (H) 65 - 99 mg/dL   Calcium, Ion 1.18 1.15 - 1.40 mmol/L   TCO2 26 0 - 100 mmol/L   Hemoglobin 14.6 12.0 - 15.0 g/dL   HCT 43.0 36.0 - 46.0 %   Dg Forearm Right  Result Date: 10/18/2016 CLINICAL DATA:  Arm pain with swelling EXAM: RIGHT FOREARM - 2 VIEW COMPARISON:  None. FINDINGS: No fracture or malalignment. No bone destruction or periostitis. No soft tissue gas. IMPRESSION: No acute osseous abnormality Electronically Signed   By: Donavan Foil M.D.   On: 10/18/2016 21:45    Initial Impression / Assessment and Plan / ED Course  I have reviewed the triage vital signs and the nursing notes.  Pertinent labs & imaging results that were available during my care of the patient were reviewed by me and considered in my medical decision making (see chart for details).  Clinical Course    10:05 PM Pain improved after treatment with oxycodone. No signs of compartment syndrome. No signs of infection. Plan ice, elevation, blood pressure improved after treatment with evening dose of lisinopril. Plan recheck blood pressure within one week. Arm to be rechecked by primary care physician 2 days. I have low pretest clinical suspicion for DVT and d-dimer is negative  Final Clinical Impressions(s) / ED Diagnoses  Diagnosis #1 right forearm pain and swelling  #2 hyperglycemia  #3 elevated blood pressure  Final diagnoses:  Left arm swelling    New Prescriptions New Prescriptions   No medications on file  Orlie Dakin, MD 10/18/16 2221

## 2016-10-18 NOTE — ED Notes (Signed)
Informed MD of manual reading on BP.

## 2017-01-20 ENCOUNTER — Encounter (HOSPITAL_COMMUNITY): Payer: Self-pay | Admitting: Emergency Medicine

## 2017-01-20 ENCOUNTER — Emergency Department (HOSPITAL_COMMUNITY)
Admission: EM | Admit: 2017-01-20 | Discharge: 2017-01-20 | Disposition: A | Discharge status: Home / Self Care | Payer: Medicare Other | Attending: Emergency Medicine | Admitting: Emergency Medicine

## 2017-01-20 DIAGNOSIS — E039 Hypothyroidism, unspecified: Secondary | ICD-10-CM | POA: Insufficient documentation

## 2017-01-20 DIAGNOSIS — Z794 Long term (current) use of insulin: Secondary | ICD-10-CM | POA: Diagnosis not present

## 2017-01-20 DIAGNOSIS — J111 Influenza due to unidentified influenza virus with other respiratory manifestations: Secondary | ICD-10-CM

## 2017-01-20 DIAGNOSIS — E1140 Type 2 diabetes mellitus with diabetic neuropathy, unspecified: Secondary | ICD-10-CM | POA: Insufficient documentation

## 2017-01-20 DIAGNOSIS — Z8673 Personal history of transient ischemic attack (TIA), and cerebral infarction without residual deficits: Secondary | ICD-10-CM | POA: Insufficient documentation

## 2017-01-20 DIAGNOSIS — Z7901 Long term (current) use of anticoagulants: Secondary | ICD-10-CM | POA: Diagnosis not present

## 2017-01-20 DIAGNOSIS — Z91040 Latex allergy status: Secondary | ICD-10-CM | POA: Diagnosis not present

## 2017-01-20 DIAGNOSIS — I10 Essential (primary) hypertension: Secondary | ICD-10-CM | POA: Diagnosis not present

## 2017-01-20 DIAGNOSIS — R69 Illness, unspecified: Secondary | ICD-10-CM

## 2017-01-20 DIAGNOSIS — R05 Cough: Secondary | ICD-10-CM | POA: Diagnosis present

## 2017-01-20 DIAGNOSIS — J45909 Unspecified asthma, uncomplicated: Secondary | ICD-10-CM | POA: Diagnosis not present

## 2017-01-20 MED ORDER — OSELTAMIVIR PHOSPHATE 75 MG PO CAPS: 75.0000 mg | ORAL_CAPSULE | Freq: Two times a day (BID) | ORAL | 0 refills | Status: DC

## 2017-01-20 NOTE — ED Triage Notes (Addendum)
Pt reports cough, sore throat, runny nose, and generalized body aches since yesterday. Pt takes BP medications at night.

## 2017-01-20 NOTE — Discharge Instructions (Signed)
As discussed, take Tamiflu for the next 5 days and remain well-hydrated. Follow-up to primary care provider in the next few days. Keep your upcoming appointment with your pulmonologist. Monitor for any worsening of condition and if you experience continued fever, cough, chest pain, shortness of breath, nausea, vomiting, or any worsening of condition please return to the emergency department in the meantime.

## 2017-01-20 NOTE — ED Provider Notes (Signed)
[facility] DEPT Provider Note   CSN: [id number] Arrival date & time: 01/20/17  1134  By signing my name below, I, Collene Leyden, attest that this documentation has been prepared under the direction and in the presence of Avie Echevaria, PA-C Electronically Signed: Collene Leyden, Scribe. 01/20/17. 3:15 PM.  History   Chief Complaint Chief Complaint  Patient presents with  . URI    HPI Comments: Makayla Frazier is a 46 y.o. female with a hx of HTN, asthma, DM, DVT, and PE, who presents to the Emergency Department complaining of a sudden-onset, intermittent cough that began yesterday. Patient has associated sore throat, rhinorrhea, generalized body aches, chest soreness due to coughing, back pain with deep breathing, left ear pain, nausea, and chills. Patient states her symptoms are worse with deep breathing. Patient has received a flu shot this year. Patient reports speaking with her PCP the last time she had a cold, in which she was told to be careful with flu medications, due to her hypertension. Patient has been using her inhaler more since becoming sick (3 times since yesterday). Patient states she has an appointment with her pulmonologist in two weeks. Patient denies any fever, vomiting, diarrhea, dysuria, hematochezia, or hematuria.    The history is provided by the patient. No language interpreter was used.    Past Medical History:  Diagnosis Date  . Anemia   . Arthritis    Left leg  . Asthma   . BV (bacterial vaginosis) 2012  . Complication of anesthesia    Seizures in PACU after surgery 3/15  . DM (diabetes mellitus) ([place])    diagnosed 2009  . DVT (deep venous thrombosis) ([place]) 2011   pt had IUD; also 05/2012  . Dysfunctional uterine bleeding    Seen by Dr. Leo Grosser, GYN; pending hysterectomy as of 07/2012  . H/O hypercoagulable state    2/2 contraception  . Headache(784.0)    migraines  . Herpes simplex without mention of complication 0000000   HSV-2  . HLD  (hyperlipidemia)   . Hypertension   . Infection    UTI  . Labial lesion 2013  . Major depression    Hx suicide attempt in 2009, [facility] admissions  . Mastodynia   . Neuromuscular disorder (HCC)    neuropathy  . Neuropathy ([place])   . Obesity   . Oligomenorrhea 2011  . PE (pulmonary embolism)    2009 - on oral contraception; multiple  . Post - coital bleeding 2012  . Seizures ([place]) 2015   last episode May 2015  . Sleep apnea   . SORE THROAT 08/10/2010   Qualifier: Diagnosis of  By: Vanessa Kick MD, Saralyn Pilar    . Status post placement of implantable loop recorder   . Stroke [facility])    TIA 2013  . Syncopal episodes    unknown etiology  . Thyroid disease    hypothyroidism (pt denies)  . Vaginal Pap smear, abnormal   . Venous insufficiency   . Vitamin D deficiency    unknown to pt.    Patient Active Problem List   Diagnosis Date Noted  . Reaction to severe stress 03/07/2016  . Bleeding per rectum 11/29/2015  . Acute pansinusitis 10/20/2015  . Candida vaginitis 10/20/2015  . Cerebrovascular accident, late effects 08/30/2015  . D (diarrhea) 08/02/2015  . Seizure disorder ([place]) 07/13/2015  . Adnexal pain 06/08/2015  . Right flank pain 05/30/2015  . Chronic pulmonary embolism ([place]) 05/14/2015  . Long term current use of anticoagulant 05/14/2015  .  Seizures ([place]) 05/07/2014  . Absence of bladder continence 03/27/2014  . Arthropathia 02/25/2014  . Chronic pain 02/25/2014  . Healed or old pulmonary embolism 02/25/2014  . HLD (hyperlipidemia) 02/25/2014  . Headache, migraine 02/25/2014  . Obstructive apnea 02/25/2014  . Type 2 diabetes mellitus ([place]) 02/25/2014  . Incontinence 02/13/2014  . S/P vaginal hysterectomy 01/29/2013  . TIA (transient ischemic attack) 08/03/2012  . Syncope 08/03/2012  . Left-sided weakness 08/03/2012  . Hypokalemia 08/03/2012  . Chest pain 08/03/2012  . Temporary cerebral vascular dysfunction 08/03/2012  . Pulmonary embolism ([place]) 05/18/2012  . Patellar  tendinitis of left knee 08/21/2011  . Knee pain, left 07/07/2011  . PRURITUS 07/04/2010  . Syncope and collapse 10/18/2009  . Allergic rhinitis 08/11/2009  . Unspecified vitamin D deficiency 07/21/2009  . Adiposity 07/19/2009  . Apnea, sleep 07/19/2009  . ANEMIA 07/10/2008  . Essential hypertension 07/08/2008  . DM w/o complication type II ([place]) 06/11/2008  . NEUROPATHY 01/22/2008  . Asthma 01/22/2008  . Mononeuritis 01/22/2008    Past Surgical History:  Procedure Laterality Date  . BILATERAL SALPINGECTOMY  12/18/2012   Procedure: BILATERAL SALPINGECTOMY;  Surgeon: Eldred Manges, MD;  Location: [facility];  Service: Gynecology;  Laterality: Bilateral;  . BLADDER SUSPENSION N/A 02/13/2014   Procedure: TRANSVAGINAL TAPE (TVT) PROCEDURE;  Surgeon: Delice Lesch, MD;  Location: [facility];  Service: Gynecology;  Laterality: N/A;  . BREAST REDUCTION SURGERY     Dr. Eugene [facility] 2011  . CARDIAC ELECTROPHYSIOLOGY STUDY & DFT     Implantable Loop recorder; no arrhythmias associated with synocpal spells; loop explanted 07-2013  . CHOLECYSTECTOMY    . DILATATION & CURRETTAGE/HYSTEROSCOPY WITH RESECTOCOPE  2007 & 2013  . ENDOMETRIAL BIOPSY  2011  . IUD REMOVAL  12/18/2012   Procedure: INTRAUTERINE DEVICE (IUD) REMOVAL;  Surgeon: Eldred Manges, MD;  Location: [facility];  Service: Gynecology;  Laterality: N/A;  Removed during prep by E. Florene Glen PA  . LAPAROSCOPIC ASSISTED VAGINAL HYSTERECTOMY  12/18/2012   Procedure: LAPAROSCOPIC ASSISTED VAGINAL HYSTERECTOMY;  Surgeon: Eldred Manges, MD;  Location: [facility];  Service: Gynecology;  Laterality: N/A;  . LOOP RECORDER EXPLANT N/A 07/17/2013   Procedure: LOOP RECORDER EXPLANT;  Surgeon: Thompson Grayer, MD;  Location: [facility] CATH [facility];  Service: Cardiovascular;  Laterality: N/A;  . TRANSVAGINAL TAPE (TVT) REMOVAL N/A 04/28/2014   Procedure: Removal of suburethral mesh;  Surgeon: Delice Lesch, MD;  Location: [facility];  Service: Gynecology;  Laterality: N/A;  .  WISDOM TOOTH EXTRACTION      OB History    Gravida Para Term Preterm AB Living   0 0           SAB TAB Ectopic Multiple Live Births                   Home Medications    Prior to Admission medications   Medication Sig Start Date End Date Taking? Authorizing Provider  acetaminophen (TYLENOL) 500 MG tablet Take 1,000 mg by mouth every 6 (six) hours as needed (for headache/pain.).    Historical Provider, MD  albuterol (PROVENTIL HFA;VENTOLIN HFA) 108 (90 BASE) MCG/ACT inhaler Inhale 2 puffs into the lungs every 6 (six) hours as needed for wheezing or shortness of breath.    Historical Provider, MD  amLODipine (NORVASC) 10 MG tablet Take 10 mg by mouth daily.    Historical Provider, MD  atorvastatin (LIPITOR) 80 MG tablet Take 80 mg by mouth daily. 12/01/15   Historical Provider,  MD  chlorthalidone (HYGROTON) 25 MG tablet Take 1 tablet (25 mg total) by mouth daily. 07/12/16   Skeet Latch, MD  DIALYVITE VITAMIN D3 MAX 57846 units TABS Take 1 tablet by mouth 2 (two) times a week. Monday and thursday 09/13/16   Historical Provider, MD  fluticasone (FLONASE) 50 MCG/ACT nasal spray Place 1 spray into both nostrils daily as needed (for nasal congestion/sinus infection).  10/18/15   Historical Provider, MD  Insulin Degludec (TRESIBA FLEXTOUCH [place]) Inject 100 Units into the skin 2 (two) times daily.     Historical Provider, MD  levETIRAcetam (KEPPRA) 500 MG tablet Take 500 mg by mouth 2 (two) times daily. 01/14/15 10/18/16  Historical Provider, MD  lisinopril (PRINIVIL,ZESTRIL) 40 MG tablet Take 1 tablet (40 mg total) by mouth daily. 02/10/16   Skeet Latch, MD  loratadine (CLARITIN) 10 MG tablet Take 10 mg by mouth daily as needed for allergies.  08/04/13   Historical Provider, MD  montelukast (SINGULAIR) 10 MG tablet Take 10 mg by mouth daily as needed (allergies). Reported on 02/16/2016 04/13/15   Historical Provider, MD  NOVOLOG FLEXPEN 100 UNIT/ML FlexPen Inject 36 Units into the skin 3 (three) times  daily as needed for high blood sugar (if over 200). Takes if sugar > 200 01/08/14   Historical Provider, MD  ondansetron (ZOFRAN ODT) 4 MG disintegrating tablet 4mg  ODT q6 hours prn nausea/vomit Patient taking differently: Take 4 mg by mouth every 6 (six) hours as needed for nausea or vomiting. 4mg  ODT q6 hours prn nausea/vomit 12/09/15   Etta Quill, NP  oseltamivir (TAMIFLU) 75 MG capsule Take 1 capsule (75 mg total) by mouth every 12 (twelve) hours. 01/20/17   Emeline General, PA-C  Rivaroxaban (XARELTO) 20 MG TABS Take 20 mg by mouth at bedtime.     Historical Provider, MD  rizatriptan (MAXALT-MLT) 5 MG disintegrating tablet Take 1 tablet (5 mg total) by mouth as needed for migraine. May repeat in 2 hours if needed 05/07/14   Marcial Pacas, MD    Family History Family History  Problem Relation Age of Onset  . Melanoma Father 44  . Diabetes Father   . Hypertension Father   . Kidney disease Father   . Ulcerative colitis Mother 78  . Diabetes Mother   . Hypertension Mother   . Breast cancer Paternal Grandmother   . Cancer Paternal Grandmother   . Diabetes type II Maternal Grandmother     deceased 37  . Diabetes Maternal Grandmother   . Pulmonary embolism Paternal Grandfather   . Stroke Maternal Grandfather     Social History Social History  Substance Use Topics  . Smoking status: Never Smoker  . Smokeless tobacco: Never Used  . Alcohol use No     Allergies   Codeine; Darvocet [propoxyphene n-acetaminophen]; Hydrocodone-acetaminophen; Latex; and Tramadol   Review of Systems Review of Systems  Constitutional: Positive for chills. Negative for fever.  HENT: Positive for ear pain (Left), rhinorrhea and sore throat.   Eyes: Negative for pain and visual disturbance.  Respiratory: Positive for cough. Negative for chest tightness, shortness of breath, wheezing and stridor.        Chest wall pain when coughing.   Cardiovascular: Negative for chest pain and palpitations.    Gastrointestinal: Positive for nausea. Negative for abdominal pain, diarrhea and vomiting.  Genitourinary: Negative for dysuria and hematuria.  Musculoskeletal: Positive for back pain (from coughing and with deep breathing) and myalgias. Negative for arthralgias.  Skin: Negative for color change  and rash.  Neurological: Negative for seizures and syncope.  All other systems reviewed and are negative.    Physical Exam Updated Vital Signs BP (!) 188/114 (BP Location: Left Arm)   Pulse 117   Temp 99.8 F (37.7 C) (Oral)   Resp 18   Ht 5' (1.524 m)   Wt 74.1 kg   LMP 11/06/2012   SpO2 100%   BMI 31.92 kg/m   Physical Exam  Constitutional: She is oriented to person, place, and time. She appears well-developed.  Patient is afebrile, non-toxic appearing, sitting comfortably in chair in no acute distress.  HENT:  Head: Normocephalic and atraumatic.  Right Ear: External ear normal.  Left Ear: External ear normal.  Mouth/Throat: Oropharynx is clear and moist. No oropharyngeal exudate.  Eyes: Conjunctivae and EOM are normal. Pupils are equal, round, and reactive to light.  Neck: Normal range of motion. Neck supple.  Cardiovascular: Normal rate, regular rhythm and normal heart sounds.   Pulmonary/Chest: Effort normal and breath sounds normal. No respiratory distress. She has no wheezes. She has no rales. She exhibits no tenderness.  Lungs are clear and equal.   Musculoskeletal: Normal range of motion.  Lymphadenopathy:    She has no cervical adenopathy.  Neurological: She is alert and oriented to person, place, and time.  Skin: Skin is warm and dry.  Psychiatric: She has a normal mood and affect.     ED Treatments / Results  DIAGNOSTIC STUDIES: Oxygen Saturation is 100% on RA, normal by my interpretation.    COORDINATION OF CARE: 3:14 PM Discussed treatment plan with pt at bedside and pt agreed to plan.  Labs (all labs ordered are listed, but only abnormal results are  displayed) Labs Reviewed - No data to display  EKG  EKG Interpretation None       Radiology No results found.  Procedures Procedures (including critical care time)  Medications Ordered in ED Medications - No data to display   Initial Impression / Assessment and Plan / ED Course  I have reviewed the triage vital signs and the nursing notes.  Pertinent labs & imaging results that were available during my care of the patient were reviewed by me and considered in my medical decision making (see chart for details).      46 y/o presenting with one day of body aches and coughing since last night. Reassuring exam, lungs are clear and equal bilaterally no wheezing or abnormal sounds. Oropharynx is clear without exudate tympanic membranes are clear. She is afebrile and nontoxic appearing without the use of antipyretics. She has used her albuterol inhaler prior to arrival in the emergency department. She is satting at 100% on room air.  Discharge home with Tamiflu, patient will be obtaining over-the-counter medication for high blood pressure patient's. She was advised plenty of fluids and get some rest and follow-up with her primary care provider.  Discussed blood pressure with patient and she states that it can be higher than that sometimes. She takes her medication at night and this is normal for her especially since she is sick. She will be follow up with PCP.  Discussed strict return precautions. Patient was advised to return to the emergency department if experiencing any new or worsening symptoms. Patient clearly understood instructions and agreed with discharge plan.   Final Clinical Impressions(s) / ED Diagnoses   Final diagnoses:  Influenza-like illness    New Prescriptions New Prescriptions   OSELTAMIVIR (TAMIFLU) 75 MG CAPSULE    Take 1  capsule (75 mg total) by mouth every 12 (twelve) hours.   I personally performed the services described in this documentation, which  was scribed in my presence. The recorded information has been reviewed and is accurate.     Emeline General, PA-C 01/20/17 [place], MD 01/29/17 [phone]

## 2017-02-10 ENCOUNTER — Other Ambulatory Visit: Payer: Self-pay | Admitting: Cardiovascular Disease

## 2017-02-12 ENCOUNTER — Other Ambulatory Visit: Payer: Self-pay | Admitting: Cardiovascular Disease

## 2017-02-12 NOTE — Telephone Encounter (Signed)
Rx has been sent to the pharmacy electronically. ° °

## 2017-02-12 NOTE — Telephone Encounter (Signed)
Please review for refill, thanks ! 

## 2017-02-12 NOTE — Telephone Encounter (Signed)
Please review for refill. Thanks!  

## 2017-03-12 ENCOUNTER — Inpatient Hospital Stay (HOSPITAL_COMMUNITY)
Admission: AD | Admit: 2017-03-12 | Discharge: 2017-03-12 | Discharge status: Against Medical Advice | Payer: Medicare Other | Source: Ambulatory Visit | Attending: Obstetrics & Gynecology | Admitting: Obstetrics & Gynecology

## 2017-03-12 ENCOUNTER — Emergency Department (HOSPITAL_BASED_OUTPATIENT_CLINIC_OR_DEPARTMENT_OTHER)
Admission: EM | Admit: 2017-03-12 | Discharge: 2017-03-12 | Disposition: A | Discharge status: Home / Self Care | Payer: Medicare Other | Attending: Dermatology | Admitting: Dermatology

## 2017-03-12 ENCOUNTER — Encounter (HOSPITAL_BASED_OUTPATIENT_CLINIC_OR_DEPARTMENT_OTHER): Payer: Self-pay

## 2017-03-12 DIAGNOSIS — J45909 Unspecified asthma, uncomplicated: Secondary | ICD-10-CM | POA: Diagnosis not present

## 2017-03-12 DIAGNOSIS — Z90710 Acquired absence of both cervix and uterus: Secondary | ICD-10-CM | POA: Insufficient documentation

## 2017-03-12 DIAGNOSIS — Z5321 Procedure and treatment not carried out due to patient leaving prior to being seen by health care provider: Secondary | ICD-10-CM

## 2017-03-12 DIAGNOSIS — N938 Other specified abnormal uterine and vaginal bleeding: Secondary | ICD-10-CM

## 2017-03-12 DIAGNOSIS — N939 Abnormal uterine and vaginal bleeding, unspecified: Secondary | ICD-10-CM | POA: Diagnosis present

## 2017-03-12 DIAGNOSIS — E119 Type 2 diabetes mellitus without complications: Secondary | ICD-10-CM | POA: Diagnosis not present

## 2017-03-12 DIAGNOSIS — I10 Essential (primary) hypertension: Secondary | ICD-10-CM | POA: Insufficient documentation

## 2017-03-12 LAB — CBC
HCT: 38.8 % (ref 36.0–46.0)
Hemoglobin: 13 g/dL (ref 12.0–15.0)
MCH: 26.2 pg (ref 26.0–34.0)
MCHC: 33.5 g/dL (ref 30.0–36.0)
MCV: 78.1 fL (ref 78.0–100.0)
PLATELETS: 331 10*3/uL (ref 150–400)
RBC: 4.97 MIL/uL (ref 3.87–5.11)
RDW: 14.8 % (ref 11.5–15.5)
WBC: 9.7 10*3/uL (ref 4.0–10.5)

## 2017-03-12 NOTE — MAU Note (Signed)
Pt C/O vaginal bleeding for the past 2 days, had hysterectomy 3 years ago.  Bleeding is not heavy, wearing a panti liner.  Has mild lower abd cramping.

## 2017-03-12 NOTE — ED Triage Notes (Addendum)
c/o vaginal bleeding x 2 days-reports hysterectomy 3 years ago-"spotting" x 2 years after bladder surgery-NAD-steady gait-pt states she was at MAU and left due to wait

## 2017-03-12 NOTE — ED Notes (Signed)
Called x 3 (for room placement); no answer.

## 2017-03-12 NOTE — ED Notes (Signed)
Called x 1 for VS re-check; no answer

## 2017-03-12 NOTE — ED Notes (Signed)
Called x 2 for vitals re-check.

## 2017-03-12 NOTE — MAU Note (Signed)
Pt left AMA before signing paper.

## 2017-06-18 ENCOUNTER — Emergency Department (HOSPITAL_COMMUNITY)
Admission: EM | Admit: 2017-06-18 | Discharge: 2017-06-18 | Disposition: A | Discharge status: Home / Self Care | Payer: Medicare Other | Attending: Emergency Medicine | Admitting: Emergency Medicine

## 2017-06-18 ENCOUNTER — Encounter (HOSPITAL_COMMUNITY): Payer: Self-pay | Admitting: Family Medicine

## 2017-06-18 DIAGNOSIS — Z79899 Other long term (current) drug therapy: Secondary | ICD-10-CM | POA: Insufficient documentation

## 2017-06-18 DIAGNOSIS — I10 Essential (primary) hypertension: Secondary | ICD-10-CM | POA: Insufficient documentation

## 2017-06-18 DIAGNOSIS — R55 Syncope and collapse: Secondary | ICD-10-CM | POA: Diagnosis present

## 2017-06-18 DIAGNOSIS — E1140 Type 2 diabetes mellitus with diabetic neuropathy, unspecified: Secondary | ICD-10-CM | POA: Insufficient documentation

## 2017-06-18 DIAGNOSIS — J45909 Unspecified asthma, uncomplicated: Secondary | ICD-10-CM | POA: Insufficient documentation

## 2017-06-18 DIAGNOSIS — Z91040 Latex allergy status: Secondary | ICD-10-CM | POA: Diagnosis not present

## 2017-06-18 DIAGNOSIS — Z7984 Long term (current) use of oral hypoglycemic drugs: Secondary | ICD-10-CM | POA: Insufficient documentation

## 2017-06-18 LAB — URINALYSIS, ROUTINE W REFLEX MICROSCOPIC
Bilirubin Urine: NEGATIVE
Glucose, UA: >=500 mg/dL — AB
HGB URINE DIPSTICK: NEGATIVE
Ketones, ur: NEGATIVE mg/dL
Leukocytes, UA: NEGATIVE
NITRITE: NEGATIVE
PROTEIN: NEGATIVE mg/dL
SPECIFIC GRAVITY, URINE: 1.014 (ref 1.005–1.030)
pH: 5 (ref 5.0–8.0)

## 2017-06-18 LAB — BASIC METABOLIC PANEL
Anion gap: 11 (ref 5–15)
BUN: 18 mg/dL (ref 6–20)
CALCIUM: 8.6 mg/dL — AB (ref 8.9–10.3)
CO2: 26 mmol/L (ref 22–32)
Chloride: 99 mmol/L — ABNORMAL LOW (ref 101–111)
Creatinine, Ser: 0.84 mg/dL (ref 0.44–1.00)
GFR calc Af Amer: >60 mL/min (ref >60)
GLUCOSE: 267 mg/dL — AB (ref 65–99)
Potassium: 3.4 mmol/L — ABNORMAL LOW (ref 3.5–5.1)
Sodium: 136 mmol/L (ref 135–145)

## 2017-06-18 LAB — CBC
HEMATOCRIT: 36.1 % (ref 36.0–46.0)
Hemoglobin: 12.2 g/dL (ref 12.0–15.0)
MCH: 26.3 pg (ref 26.0–34.0)
MCHC: 33.8 g/dL (ref 30.0–36.0)
MCV: 77.8 fL — ABNORMAL LOW (ref 78.0–100.0)
Platelets: 301 10*3/uL (ref 150–400)
RBC: 4.64 MIL/uL (ref 3.87–5.11)
RDW: 14.8 % (ref 11.5–15.5)
WBC: 9 10*3/uL (ref 4.0–10.5)

## 2017-06-18 LAB — TROPONIN I

## 2017-06-18 NOTE — ED Triage Notes (Signed)
Patient was picked up from her nephews home and transported via [facility]. Per EMS, patient was found on the ground, conscious, by patients mother. Reports having a near syncopal episode. Patient complained she felt nausea and short of breath earlier. Pt was given ZOFRAN 4mg  PO for nausea enroute. Also, complains of left upper arm pain from where she fell to the ground. Pt was able to get off the ambulance stretcher and ambulate to the hospital stretcher.

## 2017-06-18 NOTE — ED Notes (Signed)
Bed: WA01 Expected date:  Expected time:  Means of arrival:  Comments: EMS syncope

## 2017-06-18 NOTE — ED Provider Notes (Signed)
[facility] DEPT Provider Note   CSN: [id number] Arrival date & time: 06/18/17  1148     History   Chief Complaint Chief Complaint  Patient presents with  . Near Syncope    HPI Makayla Frazier is a 46 y.o. female.  46 year old female presents after having a syncopal event just prior to arrival. Patient states that she became hot and she passed out. Had not been feeling well earlier today when she became shaky and sweaty and felt that her blood sugar was low. Does have a history of insulin-dependent diabetes. Did not take her blood sugar at that time. After she passed out, blood sugar was noted to be 227. Denies any recent fever, vomiting, diarrhea. No chest pain or shortness of breath. No abdominal discomfort. Feels slightly weak at this time. Mild left hip discomfort but denies any radicular symptoms. Denies any head or neck discomfort. EMS called and patient transported here      Past Medical History:  Diagnosis Date  . Anemia   . Arthritis    Left leg  . Asthma   . BV (bacterial vaginosis) 2012  . Complication of anesthesia    Seizures in PACU after surgery 3/15  . DM (diabetes mellitus) ([place])    diagnosed 2009  . DVT (deep venous thrombosis) ([place]) 2011   pt had IUD; also 05/2012  . Dysfunctional uterine bleeding    Seen by Dr. Leo Grosser, GYN; pending hysterectomy as of 07/2012  . H/O hypercoagulable state    2/2 contraception  . Headache(784.0)    migraines  . Herpes simplex without mention of complication 07/5026   HSV-2  . HLD (hyperlipidemia)   . Hypertension   . Infection    UTI  . Labial lesion 2013  . Major depression    Hx suicide attempt in 2009, [facility] admissions  . Mastodynia   . Neuromuscular disorder (HCC)    neuropathy  . Neuropathy   . Obesity   . Oligomenorrhea 2011  . PE (pulmonary embolism)    2009 - on oral contraception; multiple  . Post - coital bleeding 2012  . Seizures ([place]) 2015   last episode May 2015  . Sleep apnea   . SORE  THROAT 08/10/2010   Qualifier: Diagnosis of  By: Vanessa Kick MD, Saralyn Pilar    . Status post placement of implantable loop recorder   . Stroke [facility])    TIA 2013  . Syncopal episodes    unknown etiology  . Thyroid disease    hypothyroidism (pt denies)  . Vaginal Pap smear, abnormal   . Venous insufficiency   . Vitamin D deficiency    unknown to pt.    Patient Active Problem List   Diagnosis Date Noted  . Reaction to severe stress 03/07/2016  . Bleeding per rectum 11/29/2015  . Acute pansinusitis 10/20/2015  . Candida vaginitis 10/20/2015  . Cerebrovascular accident, late effects 08/30/2015  . D (diarrhea) 08/02/2015  . Seizure disorder ([place]) 07/13/2015  . Adnexal pain 06/08/2015  . Right flank pain 05/30/2015  . Chronic pulmonary embolism ([place]) 05/14/2015  . Long term current use of anticoagulant 05/14/2015  . Seizures ([place]) 05/07/2014  . Absence of bladder continence 03/27/2014  . Arthropathia 02/25/2014  . Chronic pain 02/25/2014  . Healed or old pulmonary embolism 02/25/2014  . HLD (hyperlipidemia) 02/25/2014  . Headache, migraine 02/25/2014  . Obstructive apnea 02/25/2014  . Type 2 diabetes mellitus ([place]) 02/25/2014  . Incontinence 02/13/2014  . S/P vaginal hysterectomy 01/29/2013  .  TIA (transient ischemic attack) 08/03/2012  . Syncope 08/03/2012  . Left-sided weakness 08/03/2012  . Hypokalemia 08/03/2012  . Chest pain 08/03/2012  . Temporary cerebral vascular dysfunction 08/03/2012  . Pulmonary embolism ([place]) 05/18/2012  . Patellar tendinitis of left knee 08/21/2011  . Knee pain, left 07/07/2011  . PRURITUS 07/04/2010  . Syncope and collapse 10/18/2009  . Allergic rhinitis 08/11/2009  . Unspecified vitamin D deficiency 07/21/2009  . Adiposity 07/19/2009  . Apnea, sleep 07/19/2009  . ANEMIA 07/10/2008  . Essential hypertension 07/08/2008  . DM w/o complication type II ([place]) 06/11/2008  . NEUROPATHY 01/22/2008  . Asthma 01/22/2008  . Mononeuritis 01/22/2008     Past Surgical History:  Procedure Laterality Date  . BILATERAL SALPINGECTOMY  12/18/2012   Procedure: BILATERAL SALPINGECTOMY;  Surgeon: Eldred Manges, MD;  Location: [facility];  Service: Gynecology;  Laterality: Bilateral;  . BLADDER SUSPENSION N/A 02/13/2014   Procedure: TRANSVAGINAL TAPE (TVT) PROCEDURE;  Surgeon: Delice Lesch, MD;  Location: [facility];  Service: Gynecology;  Laterality: N/A;  . BREAST REDUCTION SURGERY     Dr. Eugene Garnet [facility] 2011  . CARDIAC ELECTROPHYSIOLOGY STUDY & DFT     Implantable Loop recorder; no arrhythmias associated with synocpal spells; loop explanted 07-2013  . CHOLECYSTECTOMY    . DILATATION & CURRETTAGE/HYSTEROSCOPY WITH RESECTOCOPE  2007 & 2013  . ENDOMETRIAL BIOPSY  2011  . IUD REMOVAL  12/18/2012   Procedure: INTRAUTERINE DEVICE (IUD) REMOVAL;  Surgeon: Eldred Manges, MD;  Location: [facility];  Service: Gynecology;  Laterality: N/A;  Removed during prep by E. Florene Glen PA  . LAPAROSCOPIC ASSISTED VAGINAL HYSTERECTOMY  12/18/2012   Procedure: LAPAROSCOPIC ASSISTED VAGINAL HYSTERECTOMY;  Surgeon: Eldred Manges, MD;  Location: [facility];  Service: Gynecology;  Laterality: N/A;  . LOOP RECORDER EXPLANT N/A 07/17/2013   Procedure: LOOP RECORDER EXPLANT;  Surgeon: Thompson Grayer, MD;  Location: [facility] CATH LAB;  Service: Cardiovascular;  Laterality: N/A;  . TRANSVAGINAL TAPE (TVT) REMOVAL N/A 04/28/2014   Procedure: Removal of suburethral mesh;  Surgeon: Delice Lesch, MD;  Location: [facility];  Service: Gynecology;  Laterality: N/A;  . WISDOM TOOTH EXTRACTION      OB History    Gravida Para Term Preterm AB Living   0 0           SAB TAB Ectopic Multiple Live Births                   Home Medications    Prior to Admission medications   Medication Sig Start Date End Date Taking? Authorizing Provider  acetaminophen (TYLENOL) 500 MG tablet Take 1,000 mg by mouth every 4 (four) hours as needed for mild pain, moderate pain, fever or headache.    Yes [provider]  albuterol (PROVENTIL HFA;VENTOLIN HFA) 108 (90 BASE) MCG/ACT inhaler Inhale 2 puffs into the lungs every 6 (six) hours as needed for wheezing or shortness of breath.   Yes [provider]  amLODipine (NORVASC) 10 MG tablet Take 10 mg by mouth daily.   Yes [provider]  atorvastatin (LIPITOR) 80 MG tablet Take 80 mg by mouth daily. 12/01/15  Yes [provider]  chlorthalidone (HYGROTON) 25 MG tablet Take 1 tablet (25 mg total) by mouth daily. Need appointment 02/12/17  Yes Skeet Latch, MD  fluticasone [facility]) 50 MCG/ACT nasal spray Place 1 spray into both nostrils daily as needed (for nasal congestion/sinus infection).  10/18/15  Yes [provider]  insulin regular (NOVOLIN  R,HUMULIN R) 100 units/mL injection Inject 100 Units into the skin 2 (two) times daily before a meal.   Yes [provider]  levETIRAcetam (KEPPRA) 500 MG tablet Take 500 mg by mouth 2 (two) times daily. 01/14/15 06/18/17 Yes [provider]  lisinopril (PRINIVIL,ZESTRIL) 40 MG tablet Take 1 tablet (40 mg total) by mouth daily. Need appointment 02/12/17  Yes Skeet Latch, MD  loratadine (CLARITIN) 10 MG tablet Take 10 mg by mouth daily as needed for allergies.  08/04/13  Yes [provider]  montelukast (SINGULAIR) 10 MG tablet Take 10 mg by mouth daily as needed (allergies). Reported on 02/16/2016 04/13/15  Yes [provider]  ondansetron (ZOFRAN ODT) 4 MG disintegrating tablet 4mg  ODT q6 hours prn nausea/vomit Patient taking differently: Take 4 mg by mouth every 6 (six) hours as needed for nausea or vomiting.  12/09/15  Yes Etta Quill, NP  Rivaroxaban (XARELTO) 20 MG TABS Take 20 mg by mouth at bedtime.    Yes [provider]  rizatriptan (MAXALT-MLT) 5 MG disintegrating tablet Take 1 tablet (5 mg total) by mouth as needed for migraine. May repeat in 2 hours if needed 05/07/14  Yes Marcial Pacas, MD  chlorthalidone (HYGROTON) 25 MG  tablet Take 1 tablet (25 mg total) by mouth daily. Patient not taking: Reported on 06/18/2017 07/12/16   Skeet Latch, MD    Family History Family History  Problem Relation Age of Onset  . Melanoma Father 72  . Diabetes Father   . Hypertension Father   . Kidney disease Father   . Ulcerative colitis Mother 39  . Diabetes Mother   . Hypertension Mother   . Breast cancer Paternal Grandmother   . Cancer Paternal Grandmother   . Diabetes type II Maternal Grandmother        deceased 42  . Diabetes Maternal Grandmother   . Pulmonary embolism Paternal Grandfather   . Stroke Maternal Grandfather     Social History Social History  Substance Use Topics  . Smoking status: Never Smoker  . Smokeless tobacco: Never Used  . Alcohol use No     Allergies   Codeine; Darvocet [propoxyphene n-acetaminophen]; Hydrocodone-acetaminophen; Latex; and Tramadol   Review of Systems Review of Systems  All other systems reviewed and are negative.    Physical Exam Updated Vital Signs BP 120/82 (BP Location: Right Arm)   Pulse 82   Temp 97.6 F (36.4 C) (Oral)   Resp 20   Ht 1.524 m (5')   Wt 71.2 kg (157 lb)   LMP 11/06/2012   SpO2 100%   BMI 30.66 kg/m   Physical Exam  Constitutional: She is oriented to person, place, and time. She appears well-developed and well-nourished.  Non-toxic appearance. No distress.  HENT:  Head: Normocephalic and atraumatic.  Eyes: Conjunctivae, EOM and lids are normal. Pupils are equal, round, and reactive to light.  Neck: Normal range of motion. Neck supple. No tracheal deviation present. No thyroid mass present.  Cardiovascular: Normal rate, regular rhythm and normal heart sounds.  Exam reveals no gallop.   No murmur heard. Pulmonary/Chest: Effort normal and breath sounds normal. No stridor. No respiratory distress. She has no decreased breath sounds. She has no wheezes. She has no rhonchi. She has no rales.  Abdominal: Soft. Normal appearance and  bowel sounds are normal. She exhibits no distension. There is no tenderness. There is no rebound and no CVA tenderness.  Musculoskeletal: Normal range of motion. She exhibits no edema.  Lumbar back: She exhibits tenderness. She exhibits normal range of motion.       Back:       Left upper arm: She exhibits tenderness.       Arms: Neurological: She is alert and oriented to person, place, and time. She has normal strength. No cranial nerve deficit or sensory deficit. GCS eye subscore is 4. GCS verbal subscore is 5. GCS motor subscore is 6.  Skin: Skin is warm and dry. No abrasion and no rash noted.  Psychiatric: She has a normal mood and affect. Her speech is normal and behavior is normal.  Nursing note and vitals reviewed.    ED Treatments / Results  Labs (all labs ordered are listed, but only abnormal results are displayed) Labs Reviewed  CBC - Abnormal; Notable for the following:       Result Value   MCV 77.8 (*)    All other components within normal limits  BASIC METABOLIC PANEL  URINALYSIS, ROUTINE W REFLEX MICROSCOPIC    EKG  EKG Interpretation  Date/Time:  Monday June 18 2017 12:17:22 EDT Ventricular Rate:  82 PR Interval:    QRS Duration: 98 QT Interval:  388 QTC Calculation: 454 R Axis:   17 Text Interpretation:  Sinus rhythm Confirmed by Lacretia Leigh ([id number]) on 06/18/2017 12:45:27 PM       Radiology No results found.  Procedures Procedures (including critical care time)  Medications Ordered in ED Medications - No data to display   Initial Impression / Assessment and Plan / ED Course  I have reviewed the triage vital signs and the nursing notes.  Pertinent labs & imaging results that were available during my care of the patient were reviewed by me and considered in my medical decision making (see chart for details).    Patient offered x-ray of her right upper extremity which she has deferred. Her extremities soft and has no visible evidence of  trauma. Slightly tender to palpation. Neurovascular intact at left hand. Patient given IV fluids here feels better. Likely vasovagal episode. Will discharge to home  Final Clinical Impressions(s) / ED Diagnoses   Final diagnoses:  None    New Prescriptions New Prescriptions   No medications on file     Lacretia Leigh, MD 06/18/17 1511

## 2017-07-05 ENCOUNTER — Other Ambulatory Visit: Payer: Self-pay | Admitting: Cardiovascular Disease

## 2017-07-06 NOTE — Telephone Encounter (Signed)
Please review for refill, thanks ! 

## 2017-08-16 ENCOUNTER — Emergency Department (HOSPITAL_COMMUNITY)
Admission: EM | Admit: 2017-08-16 | Discharge: 2017-08-16 | Disposition: A | Discharge status: Home / Self Care | Payer: Medicare Other | Attending: Emergency Medicine | Admitting: Emergency Medicine

## 2017-08-16 ENCOUNTER — Encounter (HOSPITAL_COMMUNITY): Payer: Self-pay | Admitting: *Deleted

## 2017-08-16 DIAGNOSIS — R55 Syncope and collapse: Secondary | ICD-10-CM

## 2017-08-16 DIAGNOSIS — Z794 Long term (current) use of insulin: Secondary | ICD-10-CM | POA: Diagnosis not present

## 2017-08-16 DIAGNOSIS — E119 Type 2 diabetes mellitus without complications: Secondary | ICD-10-CM | POA: Insufficient documentation

## 2017-08-16 DIAGNOSIS — R3915 Urgency of urination: Secondary | ICD-10-CM | POA: Insufficient documentation

## 2017-08-16 DIAGNOSIS — Z7901 Long term (current) use of anticoagulants: Secondary | ICD-10-CM | POA: Insufficient documentation

## 2017-08-16 DIAGNOSIS — R079 Chest pain, unspecified: Secondary | ICD-10-CM | POA: Insufficient documentation

## 2017-08-16 DIAGNOSIS — I10 Essential (primary) hypertension: Secondary | ICD-10-CM | POA: Diagnosis not present

## 2017-08-16 DIAGNOSIS — Z91040 Latex allergy status: Secondary | ICD-10-CM | POA: Insufficient documentation

## 2017-08-16 DIAGNOSIS — R51 Headache: Secondary | ICD-10-CM | POA: Insufficient documentation

## 2017-08-16 DIAGNOSIS — J45909 Unspecified asthma, uncomplicated: Secondary | ICD-10-CM | POA: Insufficient documentation

## 2017-08-16 DIAGNOSIS — R300 Dysuria: Secondary | ICD-10-CM | POA: Insufficient documentation

## 2017-08-16 LAB — CBG MONITORING, ED
GLUCOSE-CAPILLARY: 95 mg/dL (ref 65–99)
GLUCOSE-CAPILLARY: 136 mg/dL — AB (ref 65–99)

## 2017-08-16 LAB — CBC
HCT: 40.3 % (ref 36.0–46.0)
HEMOGLOBIN: 12.9 g/dL (ref 12.0–15.0)
MCH: 25.6 pg — ABNORMAL LOW (ref 26.0–34.0)
MCHC: 32 g/dL (ref 30.0–36.0)
MCV: 80.1 fL (ref 78.0–100.0)
PLATELETS: 330 10*3/uL (ref 150–400)
RBC: 5.03 MIL/uL (ref 3.87–5.11)
RDW: 15.3 % (ref 11.5–15.5)
WBC: 8.2 10*3/uL (ref 4.0–10.5)

## 2017-08-16 LAB — BASIC METABOLIC PANEL
ANION GAP: 8 (ref 5–15)
BUN: 7 mg/dL (ref 6–20)
CALCIUM: 9.3 mg/dL (ref 8.9–10.3)
CO2: 26 mmol/L (ref 22–32)
Chloride: 104 mmol/L (ref 101–111)
Creatinine, Ser: 0.72 mg/dL (ref 0.44–1.00)
GFR calc Af Amer: >60 mL/min (ref >60)
GLUCOSE: 91 mg/dL (ref 65–99)
Potassium: 4.1 mmol/L (ref 3.5–5.1)
Sodium: 138 mmol/L (ref 135–145)

## 2017-08-16 MED ORDER — ACETAMINOPHEN 325 MG PO TABS
650.0000 mg | ORAL_TABLET | Freq: Once | ORAL | Status: AC
  Administered 2017-08-16: 650 mg via ORAL
  Filled 2017-08-16: qty 2

## 2017-08-16 NOTE — ED Triage Notes (Signed)
Per EMS_ pt was at her PCP for diabetic issues. Pt had a syncopal episode while there. Pt states that she was dizzy prior to event. Pt has hx of same as well as seizures. No reported post ictal period. Pt alert and oriented in triage. No reported injuries.

## 2017-08-16 NOTE — ED Notes (Signed)
Patient stated does not want to wait anymore and spoke with nurse and Doctor. Will follow up with Doctor regarding urinalysis.

## 2017-08-16 NOTE — ED Provider Notes (Signed)
[place] DEPT Provider Note   CSN: [id number] Arrival date & time: 08/16/17  1056     History   Chief Complaint Chief Complaint  Patient presents with  . Loss of Consciousness    HPI Makayla Frazier is a 46 y.o. female.  HPI  Patient with pmhx of anemia, seizures, T2DM, DVT, PE presenting with syncopal episode. Before patient went into the physician office she was feeling diaphoretic and dizzy, she drank some orange juice and then went upstairs to see the physicain. She states her blood sugars have been dropping low, and this seemed like a similar hypoglycemic episodes. Per her doctor she has had 10 episodes of hypoglycemia noted over the past couple of weeks usually in the early morning, CBGs were in the 40s per patient. Patient was then leaving the office when she had a syncopal episodeStates that she got up out of her chair and that is when she became lightheaded. She braced herself against the wall and apparently lost consciousness for a couple of seconds. SHe denied falling to the ground or hitting her head. She states she was feeling nauseated right before this syncopal episode and jittery like her blood sugar was low. EMS was called and checked her blood sugar, however she is not sure what it was. PCP did adjust insulin regiment due to hypoglycemia prior to ED visit. Patient does endorse some slight chest pain, states this is chronic and unchanged, has been previously evaluated by cardiology. Patient denies hitting her head, though she does note a slight frontal headache - 3-4 on pain scale.  Past Medical History:  Diagnosis Date  . Anemia   . Arthritis    Left leg  . Asthma   . BV (bacterial vaginosis) 2012  . Complication of anesthesia    Seizures in PACU after surgery 3/15  . DM (diabetes mellitus) ([place])    diagnosed 2009  . DVT (deep venous thrombosis) ([place]) 2011   pt had IUD; also 05/2012  . Dysfunctional uterine bleeding    Seen by Dr. Leo Grosser, GYN; pending  hysterectomy as of 07/2012  . H/O hypercoagulable state    2/2 contraception  . Headache(784.0)    migraines  . Herpes simplex without mention of complication 0/9735   HSV-2  . HLD (hyperlipidemia)   . Hypertension   . Infection    UTI  . Labial lesion 2013  . Major depression    Hx suicide attempt in 2009, [facility] admissions  . Mastodynia   . Neuromuscular disorder (HCC)    neuropathy  . Neuropathy   . Obesity   . Oligomenorrhea 2011  . PE (pulmonary embolism)    2009 - on oral contraception; multiple  . Post - coital bleeding 2012  . Seizures ([place]) 2015   last episode May 2015  . Sleep apnea   . SORE THROAT 08/10/2010   Qualifier: Diagnosis of  By: Vanessa Kick MD, Saralyn Pilar    . Status post placement of implantable loop recorder   . Stroke [facility])    TIA 2013  . Syncopal episodes    unknown etiology  . Thyroid disease    hypothyroidism (pt denies)  . Vaginal Pap smear, abnormal   . Venous insufficiency   . Vitamin D deficiency    unknown to pt.    Patient Active Problem List   Diagnosis Date Noted  . Reaction to severe stress 03/07/2016  . Bleeding per rectum 11/29/2015  . Acute pansinusitis 10/20/2015  . Candida vaginitis 10/20/2015  .  Cerebrovascular accident, late effects 08/30/2015  . D (diarrhea) 08/02/2015  . Seizure disorder ([place]) 07/13/2015  . Adnexal pain 06/08/2015  . Right flank pain 05/30/2015  . Chronic pulmonary embolism ([place]) 05/14/2015  . Long term current use of anticoagulant 05/14/2015  . Seizures ([place]) 05/07/2014  . Absence of bladder continence 03/27/2014  . Arthropathia 02/25/2014  . Chronic pain 02/25/2014  . Healed or old pulmonary embolism 02/25/2014  . HLD (hyperlipidemia) 02/25/2014  . Headache, migraine 02/25/2014  . Obstructive apnea 02/25/2014  . Type 2 diabetes mellitus ([place]) 02/25/2014  . Incontinence 02/13/2014  . S/P vaginal hysterectomy 01/29/2013  . TIA (transient ischemic attack) 08/03/2012  . Syncope 08/03/2012  . Left-sided  weakness 08/03/2012  . Hypokalemia 08/03/2012  . Chest pain 08/03/2012  . Temporary cerebral vascular dysfunction 08/03/2012  . Pulmonary embolism ([place]) 05/18/2012  . Patellar tendinitis of left knee 08/21/2011  . Knee pain, left 07/07/2011  . PRURITUS 07/04/2010  . Syncope and collapse 10/18/2009  . Allergic rhinitis 08/11/2009  . Unspecified vitamin D deficiency 07/21/2009  . Adiposity 07/19/2009  . Apnea, sleep 07/19/2009  . ANEMIA 07/10/2008  . Essential hypertension 07/08/2008  . DM w/o complication type II ([place]) 06/11/2008  . NEUROPATHY 01/22/2008  . Asthma 01/22/2008  . Mononeuritis 01/22/2008    Past Surgical History:  Procedure Laterality Date  . BILATERAL SALPINGECTOMY  12/18/2012   Procedure: BILATERAL SALPINGECTOMY;  Surgeon: Eldred Manges, MD;  Location: [facility];  Service: Gynecology;  Laterality: Bilateral;  . BLADDER SUSPENSION N/A 02/13/2014   Procedure: TRANSVAGINAL TAPE (TVT) PROCEDURE;  Surgeon: Delice Lesch, MD;  Location: [facility];  Service: Gynecology;  Laterality: N/A;  . BREAST REDUCTION SURGERY     Dr. Eugene Garnet [facility] 2011  . CARDIAC ELECTROPHYSIOLOGY STUDY & DFT     Implantable Loop recorder; no arrhythmias associated with synocpal spells; loop explanted 07-2013  . CHOLECYSTECTOMY    . DILATATION & CURRETTAGE/HYSTEROSCOPY WITH RESECTOCOPE  2007 & 2013  . ENDOMETRIAL BIOPSY  2011  . IUD REMOVAL  12/18/2012   Procedure: INTRAUTERINE DEVICE (IUD) REMOVAL;  Surgeon: Eldred Manges, MD;  Location: [facility];  Service: Gynecology;  Laterality: N/A;  Removed during prep by E. Florene Glen PA  . LAPAROSCOPIC ASSISTED VAGINAL HYSTERECTOMY  12/18/2012   Procedure: LAPAROSCOPIC ASSISTED VAGINAL HYSTERECTOMY;  Surgeon: Eldred Manges, MD;  Location: [facility];  Service: Gynecology;  Laterality: N/A;  . LOOP RECORDER EXPLANT N/A 07/17/2013   Procedure: LOOP RECORDER EXPLANT;  Surgeon: Thompson Grayer, MD;  Location: [facility] CATH LAB;  Service: Cardiovascular;  Laterality: N/A;  .  TRANSVAGINAL TAPE (TVT) REMOVAL N/A 04/28/2014   Procedure: Removal of suburethral mesh;  Surgeon: Delice Lesch, MD;  Location: [place] [facility];  Service: Gynecology;  Laterality: N/A;  . WISDOM TOOTH EXTRACTION      OB History    Gravida Para Term Preterm AB Living   0 0           SAB TAB Ectopic Multiple Live Births                   Home Medications    Prior to Admission medications   Medication Sig Start Date End Date Taking? Authorizing Provider  acetaminophen (TYLENOL) 500 MG tablet Take 1,000 mg by mouth every 6 (six) hours as needed for mild pain, moderate pain, fever or headache.    Yes [provider]  albuterol (PROVENTIL HFA;VENTOLIN HFA) 108 (90 BASE) MCG/ACT inhaler Inhale 2 puffs into the lungs every  6 (six) hours as needed for wheezing or shortness of breath.   Yes [provider]  amLODipine (NORVASC) 10 MG tablet Take 10 mg by mouth daily.   Yes [provider]  atorvastatin (LIPITOR) 80 MG tablet Take 80 mg by mouth daily. 12/01/15  Yes [provider]  budesonide-formoterol (SYMBICORT) 80-4.5 MCG/ACT inhaler Inhale 2 puffs into the lungs 2 (two) times daily.   Yes [provider]  chlorthalidone (HYGROTON) 25 MG tablet TAKE 1 TABLET(25 MG) BY MOUTH DAILY 07/06/17  Yes Skeet Latch, MD  insulin regular (NOVOLIN R,HUMULIN R) 100 units/mL injection Inject 100-150 Units into the skin See admin instructions. 150 100 100 before food   Yes [provider]  insulin regular human CONCENTRATED (HUMULIN R U-500 KWIKPEN) 500 UNIT/ML kwikpen Inject 100-150 Units into the skin See admin instructions. 02/16/17  Yes [provider]  levETIRAcetam (KEPPRA) 500 MG tablet Take 500 mg by mouth 2 (two) times daily. 01/14/15 08/16/17 Yes [provider]  lisinopril (PRINIVIL,ZESTRIL) 40 MG tablet TAKE 1 TABLET(40 MG) BY MOUTH DAILY 07/06/17  Yes Skeet Latch, MD  loratadine (CLARITIN) 10 MG tablet Take 10 mg by mouth at  bedtime.  08/04/13  Yes [provider]  montelukast (SINGULAIR) 10 MG tablet Take 10 mg by mouth daily as needed (allergies). Reported on 02/16/2016 04/13/15  Yes [provider]  Rivaroxaban (XARELTO) 20 MG TABS Take 20 mg by mouth at bedtime.    Yes [provider]  fluticasone (FLONASE) 50 MCG/ACT nasal spray Place 1 spray into both nostrils daily as needed (for nasal congestion/sinus infection).  10/18/15   [provider]  ondansetron (ZOFRAN ODT) 4 MG disintegrating tablet 4mg  ODT q6 hours prn nausea/vomit Patient taking differently: Take 4 mg by mouth every 6 (six) hours as needed for nausea or vomiting.  12/09/15   Etta Quill, NP  rizatriptan (MAXALT-MLT) 5 MG disintegrating tablet Take 1 tablet (5 mg total) by mouth as needed for migraine. May repeat in 2 hours if needed 05/07/14   Marcial Pacas, MD    Family History Family History  Problem Relation Age of Onset  . Melanoma Father 31  . Diabetes Father   . Hypertension Father   . Kidney disease Father   . Ulcerative colitis Mother 7  . Diabetes Mother   . Hypertension Mother   . Breast cancer Paternal Grandmother   . Cancer Paternal Grandmother   . Diabetes type II Maternal Grandmother        deceased 37  . Diabetes Maternal Grandmother   . Pulmonary embolism Paternal Grandfather   . Stroke Maternal Grandfather     Social History Social History  Substance Use Topics  . Smoking status: Never Smoker  . Smokeless tobacco: Never Used  . Alcohol use No     Allergies   Codeine; Darvocet [propoxyphene n-acetaminophen]; Hydrocodone-acetaminophen; Latex; Tape; and Tramadol   Review of Systems Review of Systems  Constitutional: Negative for chills, fatigue and fever.  HENT: Negative for congestion.   Respiratory: Negative for cough and shortness of breath.   Cardiovascular: Positive for chest pain. Negative for palpitations and leg swelling.  Gastrointestinal: Negative for abdominal pain,  nausea and vomiting.  Genitourinary: Positive for dysuria, frequency and urgency.  Neurological: Positive for syncope. Negative for dizziness, weakness, numbness and headaches.     Physical Exam Updated Vital Signs BP (!) 150/83   Pulse 86   Temp 97.9 F (36.6 C)   Resp 17   LMP 11/06/2012  SpO2 99%   Physical Exam  Constitutional: She is oriented to person, place, and time. She appears well-developed and well-nourished.  HENT:  Head: Normocephalic and atraumatic.  Eyes: Pupils are equal, round, and reactive to light.  Neck: Normal range of motion. Neck supple.  Cardiovascular: Normal rate, regular rhythm and normal heart sounds.   Pulmonary/Chest: Effort normal and breath sounds normal.  Abdominal: Soft. Bowel sounds are normal.  Musculoskeletal: Normal range of motion.  Neurological: She is alert and oriented to person, place, and time. No cranial nerve deficit or sensory deficit. She exhibits normal muscle tone. Coordination normal.  Skin: Skin is warm. Capillary refill takes less than 2 seconds.  Psychiatric: She has a normal mood and affect.     ED Treatments / Results  Labs (all labs ordered are listed, but only abnormal results are displayed) Labs Reviewed  CBC - Abnormal; Notable for the following:       Result Value   MCH 25.6 (*)    All other components within normal limits  CBG MONITORING, ED - Abnormal; Notable for the following:    Glucose-Capillary 136 (*)    All other components within normal limits  BASIC METABOLIC PANEL  URINALYSIS, ROUTINE W REFLEX MICROSCOPIC  CBG MONITORING, ED    EKG  EKG Interpretation  Date/Time:  Thursday August 16 2017 11:00:56 EDT Ventricular Rate:  81 PR Interval:  144 QRS Duration: 68 QT Interval:  388 QTC Calculation: 450 R Axis:   6 Text Interpretation:  Normal sinus rhythm Normal ECG Confirmed by Quintella Reichert [phone]) on 08/16/2017 1:21:25 PM       Radiology No results found.  Procedures Procedures  (including critical care time)  Medications Ordered in ED Medications  acetaminophen (TYLENOL) tablet 650 mg (650 mg Oral Given 08/16/17 1347)     Initial Impression / Assessment and Plan / ED Course  I have reviewed the triage vital signs and the nursing notes.  Pertinent labs & imaging results that were available during my care of the patient were reviewed by me and considered in my medical decision making (see chart for details).  Patient with a history of syncopal events. Presenting with syncopal event possibly due to orthostatics versus hypoglycemia vs. UTI. Patient has been seen by neurology in the past as well as cardiology for syncopal evaluation. Lab work without any concerning symptoms. Patient's insulin regimen has been changed by endocrinologist with the hopes of improvement of her hypoglycemic episodes. Discussed with patient importance of slowly moving from a sitting to standing position. Patient opted to leave before the results of UA could be run, states she will follow up with her PCP for UTI concerns   Final Clinical Impressions(s) / ED Diagnoses   Final diagnoses:  Syncope, unspecified syncope type    New Prescriptions New Prescriptions   No medications on file     Tonette Bihari, MD 08/16/17 1519    Tonette Bihari, MD 08/16/17 1519    Quintella Reichert, MD 08/21/17 1255

## 2017-08-16 NOTE — Discharge Instructions (Signed)
Please follow-up with your PCP to get your urine checked for concern for UTI. If you develop any further hypoglycemic episodes please call your PCP to discuss your insulin regimen.

## 2017-09-12 ENCOUNTER — Ambulatory Visit (INDEPENDENT_AMBULATORY_CARE_PROVIDER_SITE_OTHER): Payer: Medicare Other | Admitting: Cardiology

## 2017-09-12 ENCOUNTER — Encounter: Payer: Self-pay | Admitting: Cardiology

## 2017-09-12 VITALS — BP 130/88 | HR 90 | Ht 60.0 in | Wt 162.4 lb

## 2017-09-12 DIAGNOSIS — R0602 Shortness of breath: Secondary | ICD-10-CM

## 2017-09-12 DIAGNOSIS — G8929 Other chronic pain: Secondary | ICD-10-CM

## 2017-09-12 DIAGNOSIS — E118 Type 2 diabetes mellitus with unspecified complications: Secondary | ICD-10-CM

## 2017-09-12 DIAGNOSIS — R079 Chest pain, unspecified: Secondary | ICD-10-CM

## 2017-09-12 DIAGNOSIS — Z794 Long term (current) use of insulin: Secondary | ICD-10-CM

## 2017-09-12 DIAGNOSIS — R55 Syncope and collapse: Secondary | ICD-10-CM | POA: Diagnosis not present

## 2017-09-12 DIAGNOSIS — G40909 Epilepsy, unspecified, not intractable, without status epilepticus: Secondary | ICD-10-CM | POA: Diagnosis not present

## 2017-09-12 DIAGNOSIS — I10 Essential (primary) hypertension: Secondary | ICD-10-CM

## 2017-09-12 DIAGNOSIS — Z86711 Personal history of pulmonary embolism: Secondary | ICD-10-CM

## 2017-09-12 DIAGNOSIS — IMO0001 Reserved for inherently not codable concepts without codable children: Secondary | ICD-10-CM

## 2017-09-12 NOTE — Assessment & Plan Note (Signed)
06/19/2014  Chest pains, never resolved since PE in 2009.   Atypical for angina, worse with deep inspiration. Followed by pulmonary at [facility]. History of negative stress echo in 2016

## 2017-09-12 NOTE — Assessment & Plan Note (Signed)
Labile B/P. Compliance issues- no Amlodipine "for months"

## 2017-09-12 NOTE — Assessment & Plan Note (Addendum)
Loop recorder placed 2012-2014- no significant arrhythmia and pt subsequently diagnosed with seizures. Recent episode of syncope 08/16/17 prompting ED visit to [facility] and referral here

## 2017-09-12 NOTE — Assessment & Plan Note (Signed)
She sees a neurologist in [place]

## 2017-09-12 NOTE — Assessment & Plan Note (Signed)
Labile BS according to patient "40-220" Being followed by her PCP at [facility] who is adjusting her medications

## 2017-09-12 NOTE — Progress Notes (Signed)
09/12/2017 Makayla Frazier   Aug 19, 1971  [id number]  Primary Physician Briscoe, Jannifer Rodney, MD Primary Cardiologist: Dr [place]/ Dr Rayann Heman  HPI:  46 y/o AA female, HTN, IDDM, with a history of syncope. She was seen by Dr Rayann Heman in the past and had a loop recorder placed 2012-2014 that did not demonstrate an arrhythmia to explain her syncope. She was subsequently diagnosed with seizures and her symptoms improved with antiepileptics. She has a history of atypical chest pain and had a stress echo 04/2015 with [facility] that was negative for ischemia.  She reports having chest pain since 2009. The episodes occur when taking a deep breath, and breathing cold air, or when walking. She was diagnosed with a PE in 2010 and had a recurrent PE in 2013.  She has been on Xarelto chronically since that time.  It occurred in the setting of driving long distances and using OCPs.  A CTA was done in Feb 2018 at [facility] that did not show a PE. We have not seen her since Feb 2017. I belive she has seen a cardiologist at [facility] in the interm but those records were not available to me.   She is seen now after she went to [facility] after a syncopal spell after leaving her PCP's office. The pt tells me she had been having problems with her BS being labile and her PCP was adjusting her medications for this. When she was leaving she felt dizzy and weak, "not feeling that good that day anyway".  Her BS earlier had been in the 40's and this was treated. When she got tho the ED her BS was 220. She had no injury. Since then she tells me her B/P has been up and down though she admits she doesn't;t have a B/P cuff at home and doesn't check her B/P, she has been told by providers that its latently high and low. She admits she hasn't been compliant with all her medications- no Amlodipine "for months". She takes all her medications at 6 pm daily.    Current Outpatient Prescriptions  Medication Sig Dispense Refill  . acetaminophen (TYLENOL)  500 MG tablet Take 1,000 mg by mouth every 6 (six) hours as needed for mild pain, moderate pain, fever or headache.     . albuterol (PROVENTIL HFA;VENTOLIN HFA) 108 (90 BASE) MCG/ACT inhaler Inhale 2 puffs into the lungs every 6 (six) hours as needed for wheezing or shortness of breath.    Marland Kitchen amLODipine (NORVASC) 10 MG tablet Take 10 mg by mouth daily.    Marland Kitchen atorvastatin (LIPITOR) 80 MG tablet Take 80 mg by mouth daily.    . budesonide-formoterol (SYMBICORT) 80-4.5 MCG/ACT inhaler Inhale 2 puffs into the lungs 2 (two) times daily.    . chlorthalidone (HYGROTON) 25 MG tablet TAKE 1 TABLET(25 MG) BY MOUTH DAILY 15 tablet 0  . fluticasone (FLONASE) 50 MCG/ACT nasal spray Place 1 spray into both nostrils daily as needed (for nasal congestion/sinus infection).     . insulin regular human CONCENTRATED (HUMULIN R U-500 KWIKPEN) 500 UNIT/ML kwikpen Inject 100-150 Units into the skin See admin instructions. 150 units in the morning before breakfast then 100 units midday before lunch then 100 units at bedtime    . lisinopril (PRINIVIL,ZESTRIL) 40 MG tablet TAKE 1 TABLET(40 MG) BY MOUTH DAILY 15 tablet 0  . loratadine (CLARITIN) 10 MG tablet Take 10 mg by mouth at bedtime.     . montelukast (SINGULAIR) 10 MG tablet Take 10 mg  by mouth at bedtime. Reported on 02/16/2016    . ondansetron (ZOFRAN ODT) 4 MG disintegrating tablet 4mg  ODT q6 hours prn nausea/vomit (Patient taking differently: Take 4 mg by mouth every 6 (six) hours as needed for nausea or vomiting. ) 10 tablet 0  . Rivaroxaban (XARELTO) 20 MG TABS Take 20 mg by mouth at bedtime.     . rizatriptan (MAXALT-MLT) 5 MG disintegrating tablet Take 1 tablet (5 mg total) by mouth as needed for migraine. May repeat in 2 hours if needed 15 tablet 12  . levETIRAcetam (KEPPRA) 500 MG tablet Take 500 mg by mouth 2 (two) times daily.     No current facility-administered medications for this visit.     Allergies  Allergen Reactions  . Codeine Nausea And Vomiting     Pt takes Percocet without problems   . Darvocet [Propoxyphene N-Acetaminophen] Nausea And Vomiting  . Hydrocodone-Acetaminophen Nausea And Vomiting  . Latex Rash  . Tape Itching and Rash  . Tramadol Nausea Only    Past Medical History:  Diagnosis Date  . Anemia   . Arthritis    Left leg  . Asthma   . BV (bacterial vaginosis) 2012  . Complication of anesthesia    Seizures in PACU after surgery 3/15  . DM (diabetes mellitus) ([place])    diagnosed 2009  . DVT (deep venous thrombosis) ([place]) 2011   pt had IUD; also 05/2012  . Dysfunctional uterine bleeding    Seen by Dr. Leo Grosser, GYN; pending hysterectomy as of 07/2012  . H/O hypercoagulable state    2/2 contraception  . Headache(784.0)    migraines  . Herpes simplex without mention of complication 05/3784   HSV-2  . HLD (hyperlipidemia)   . Hypertension   . Infection    UTI  . Labial lesion 2013  . Major depression    Hx suicide attempt in 2009, [facility] admissions  . Mastodynia   . Neuromuscular disorder (HCC)    neuropathy  . Neuropathy   . Obesity   . Oligomenorrhea 2011  . PE (pulmonary embolism)    2009 - on oral contraception; multiple  . Post - coital bleeding 2012  . Seizures ([place]) 2015   last episode May 2015  . Sleep apnea   . SORE THROAT 08/10/2010   Qualifier: Diagnosis of  By: Vanessa Kick MD, Saralyn Pilar    . Status post placement of implantable loop recorder   . Stroke [facility])    TIA 2013  . Syncopal episodes    unknown etiology  . Thyroid disease    hypothyroidism (pt denies)  . Vaginal Pap smear, abnormal   . Venous insufficiency   . Vitamin D deficiency    unknown to pt.    Social History   Social History  . Marital status: Married    Spouse name: N/A  . Number of children: N/A  . Years of education: N/A   Occupational History  . 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 Smoking status: Never Smoker  . Smokeless tobacco: Never Used  . Alcohol use  No  . Drug use: No  . Sexual activity: Yes    Birth control/ protection: Surgical   Other Topics Concern  . Not on file   Social History Narrative   Lives in [place]   Lives with significant other, Gwyndolyn Saxon   Completed 10th grade.   Worker Compensation Case 2009-2012 related to PE  Epworth Sleepiness Scale      Total score:  13      --I have high BP   --I have had a stroke   --I have had insomnia   --I have been told that I stop breathing during sleep   --I wake up to urinate frequently at night   --I wake up with a dry mouth and a sore throat   --I have Diabetes   --I have been told that I snore   --I am overweight or am gaining weight   --I have problems with memory/concentration   --I awake feeling not rested   --I frequently awake with headaches   --I have dozed off or fallen asleep at inappropriate times during the day     Family History  Problem Relation Age of Onset  . Melanoma Father 58  . Diabetes Father   . Hypertension Father   . Kidney disease Father   . Ulcerative colitis Mother 75  . Diabetes Mother   . Hypertension Mother   . Breast cancer Paternal Grandmother   . Cancer Paternal Grandmother   . Diabetes type II Maternal Grandmother        deceased 50  . Diabetes Maternal Grandmother   . Pulmonary embolism Paternal Grandfather   . Stroke Maternal Grandfather      Review of Systems: General: negative for chills, fever, night sweats or weight changes.  Cardiovascular: negative for chest pain, dyspnea on exertion, edema, orthopnea, palpitations, paroxysmal nocturnal dyspnea or shortness of breath Dermatological: negative for rash Respiratory: negative for cough or wheezing Urologic: negative for hematuria Abdominal: negative for nausea, vomiting, diarrhea, bright red blood per rectum, melena, or hematemesis Neurologic: negative for visual changes, syncope, or dizziness All other systems reviewed and are otherwise negative except as noted  above.    Blood pressure 130/88, pulse 90, height 5' (1.524 m), weight 162 lb 6.4 oz (73.7 kg), last menstrual period 11/06/2012.  General appearance: alert, cooperative, no distress and mildly obese HEENT: normocephalic, EOMI Neck: no carotid bruit, no JVD and thyroid not enlarged, symmetric, no tenderness/mass/nodules Lungs: clear to auscultation bilaterally Heart: regular rate and rhythm Abdomen: soft, non-tender; bowel sounds normal; no masses,  no organomegaly Extremities: extremities normal, atraumatic, no cyanosis or edema Pulses: 2+ and symmetric Skin: Skin color, texture, turgor normal. No rashes or lesions Neurologic: Grossly normal  Psych: appropriate affect  EKG NSR, No acute changes, QTC 490  ASSESSMENT AND PLAN:   Syncope and collapse Loop recorder placed 2012-2014- no significant arrhythmia and pt subsequently diagnosed with seizures. Recent episode of syncope 08/16/17 prompting ED visit to [facility] and referral here  Seizure disorder [facility]) She sees a neurologist in [place]  Chronic chest pain 06/19/2014  Chest pains, never resolved since PE in 2009.   Atypical for angina, worse with deep inspiration. Followed by pulmonary at [facility]. History of negative stress echo in 2016  Insulin dependent diabetes mellitus with complications (HCC) Labile BS according to patient "40-220" Being followed by her PCP at [facility] who is adjusting her medications  Essential hypertension Labile B/P. Compliance issues- no Amlodipine "for months"   PLAN I spent 20 minutes reviewing her [facility] records. I suggested we obtain an echo. I also suggested she take her B/P daily x 2 weeks (different times of the day) and record the results. I told her to take her lisinopril QHS and her chlorthalidone QAM. We'll see her back in two weeks for further evaluation. If she has recurrent syncope she may  need an event monitor placed, I think the episode she had 08/16/17 may have been vagal with transient  hypotension.  Kerin Ransom PA-C 09/12/2017 9:51 AM

## 2017-09-12 NOTE — Patient Instructions (Signed)
Medication Instructions: Start taking your Lisinopril and Chlorthalidone in the morning.  If you need a refill on your cardiac medications before your next appointment, please call your pharmacy.    Procedures/Testing: Your physician has requested that you have an echocardiogram. Echocardiography is a painless test that uses sound waves to create images of your heart. It provides your doctor with information about the size and shape of your heart and how well your heart's chambers and valves are working. This procedure takes approximately one hour. There are no restrictions for this procedure. This will be done at [address].   Follow-Up: Your physician wants you to follow-up in: 2 weeks with Kerin Ransom, PA.    Special Instructions:  Take your blood pressure daily, at different times of the day. Please record them and bring this to your next appointment.   Thank you for choosing [facility] at [facility]!!

## 2017-09-21 ENCOUNTER — Other Ambulatory Visit (HOSPITAL_COMMUNITY): Payer: Medicare Other

## 2017-10-01 ENCOUNTER — Ambulatory Visit: Payer: Medicare Other | Admitting: Cardiology

## 2017-10-01 ENCOUNTER — Other Ambulatory Visit: Payer: Self-pay

## 2017-10-01 ENCOUNTER — Ambulatory Visit (HOSPITAL_COMMUNITY): Payer: Medicare Other | Attending: Cardiology

## 2017-10-01 DIAGNOSIS — E785 Hyperlipidemia, unspecified: Secondary | ICD-10-CM | POA: Diagnosis not present

## 2017-10-01 DIAGNOSIS — I119 Hypertensive heart disease without heart failure: Secondary | ICD-10-CM | POA: Insufficient documentation

## 2017-10-01 DIAGNOSIS — R079 Chest pain, unspecified: Secondary | ICD-10-CM | POA: Diagnosis not present

## 2017-10-01 DIAGNOSIS — D649 Anemia, unspecified: Secondary | ICD-10-CM | POA: Insufficient documentation

## 2017-10-01 DIAGNOSIS — Z8673 Personal history of transient ischemic attack (TIA), and cerebral infarction without residual deficits: Secondary | ICD-10-CM | POA: Diagnosis not present

## 2017-10-01 DIAGNOSIS — R0602 Shortness of breath: Secondary | ICD-10-CM | POA: Insufficient documentation

## 2017-10-01 DIAGNOSIS — Z86711 Personal history of pulmonary embolism: Secondary | ICD-10-CM | POA: Insufficient documentation

## 2017-10-01 DIAGNOSIS — E119 Type 2 diabetes mellitus without complications: Secondary | ICD-10-CM | POA: Diagnosis not present

## 2017-10-01 DIAGNOSIS — G4733 Obstructive sleep apnea (adult) (pediatric): Secondary | ICD-10-CM | POA: Diagnosis not present

## 2017-10-10 ENCOUNTER — Encounter: Payer: Self-pay | Admitting: Cardiology

## 2017-10-10 ENCOUNTER — Ambulatory Visit (INDEPENDENT_AMBULATORY_CARE_PROVIDER_SITE_OTHER): Payer: Medicare Other | Admitting: Cardiology

## 2017-10-10 VITALS — BP 120/82 | HR 100 | Ht 60.0 in | Wt 158.2 lb

## 2017-10-10 DIAGNOSIS — I10 Essential (primary) hypertension: Secondary | ICD-10-CM

## 2017-10-10 NOTE — Patient Instructions (Signed)
Medication Instructions:  Continue current medications  If you need a refill on your cardiac medications before your next appointment, please call your pharmacy.  Labwork: None Ordered HERE IN OUR OFFICE AT [facility]  Testing/Procedures: None Ordered  Follow-Up: Your physician wants you to follow-up in: 6 Months with dr Oval Linsey. You should receive a reminder letter in the mail two months in advance. If you do not receive a letter, please call our office [phone].    Thank you for choosing [facility] at [facility]!!

## 2017-10-10 NOTE — Progress Notes (Signed)
10/10/2017 Makayla Frazier   1971-10-20  [id number]  Primary Physician Katherina Mires, MD Primary Cardiologist: Dr Sedley/ Dr Rayann Heman  HPI:  Seen today in follow up after OV 09/12/17. I reviewed her echo with her. She has not had further syncope. Her B/P has been controlled. I suspect she may have gotten dehydrated and was hypotensive. That in combination of her moderate LVH and hyperdynamic LVF may have cause her syncope.    Current Outpatient Prescriptions  Medication Sig Dispense Refill  . acetaminophen (TYLENOL) 500 MG tablet Take 1,000 mg by mouth every 6 (six) hours as needed for mild pain, moderate pain, fever or headache.     . albuterol (PROVENTIL HFA;VENTOLIN HFA) 108 (90 BASE) MCG/ACT inhaler Inhale 2 puffs into the lungs every 6 (six) hours as needed for wheezing or shortness of breath.    Marland Kitchen amLODipine (NORVASC) 10 MG tablet Take 10 mg by mouth daily.    Marland Kitchen atorvastatin (LIPITOR) 80 MG tablet Take 80 mg by mouth daily.    . budesonide-formoterol (SYMBICORT) 80-4.5 MCG/ACT inhaler Inhale 2 puffs into the lungs 2 (two) times daily.    . chlorthalidone (HYGROTON) 25 MG tablet TAKE 1 TABLET(25 MG) BY MOUTH DAILY 15 tablet 0  . fluticasone (FLONASE) 50 MCG/ACT nasal spray Place 1 spray into both nostrils daily as needed (for nasal congestion/sinus infection).     . insulin regular human CONCENTRATED (HUMULIN R U-500 KWIKPEN) 500 UNIT/ML kwikpen Inject 100-150 Units into the skin See admin instructions. 150 units in the morning before breakfast then 100 units midday before lunch then 100 units at bedtime    . lisinopril (PRINIVIL,ZESTRIL) 40 MG tablet TAKE 1 TABLET(40 MG) BY MOUTH DAILY 15 tablet 0  . loratadine (CLARITIN) 10 MG tablet Take 10 mg by mouth at bedtime.     . montelukast (SINGULAIR) 10 MG tablet Take 10 mg by mouth at bedtime. Reported on 02/16/2016    . ondansetron (ZOFRAN ODT) 4 MG disintegrating tablet 4mg  ODT q6 hours prn nausea/vomit (Patient taking differently:  Take 4 mg by mouth every 6 (six) hours as needed for nausea or vomiting. ) 10 tablet 0  . Rivaroxaban (XARELTO) 20 MG TABS Take 20 mg by mouth at bedtime.     . rizatriptan (MAXALT-MLT) 5 MG disintegrating tablet Take 1 tablet (5 mg total) by mouth as needed for migraine. May repeat in 2 hours if needed 15 tablet 12  . levETIRAcetam (KEPPRA) 500 MG tablet Take 500 mg by mouth 2 (two) times daily.     No current facility-administered medications for this visit.     Allergies  Allergen Reactions  . Codeine Nausea And Vomiting    Pt takes Percocet without problems   . Darvocet [Propoxyphene N-Acetaminophen] Nausea And Vomiting  . Hydrocodone-Acetaminophen Nausea And Vomiting  . Hydrocodone-Acetaminophen Nausea And Vomiting    Vomiting  . Latex Rash  . Tape Itching and Rash  . Tramadol Nausea Only    Past Medical History:  Diagnosis Date  . Anemia   . Arthritis    Left leg  . Asthma   . BV (bacterial vaginosis) 2012  . Complication of anesthesia    Seizures in PACU after surgery 3/15  . DM (diabetes mellitus) ([place])    diagnosed 2009  . DVT (deep venous thrombosis) ([place]) 2011   pt had IUD; also 05/2012  . Dysfunctional uterine bleeding    Seen by Dr. Leo Grosser, GYN; pending hysterectomy as of 07/2012  . H/O hypercoagulable  state    2/2 contraception  . Headache(784.0)    migraines  . Herpes simplex without mention of complication 01/9517   HSV-2  . HLD (hyperlipidemia)   . Hypertension   . Infection    UTI  . Labial lesion 2013  . Major depression    Hx suicide attempt in 2009, [facility] admissions  . Mastodynia   . Neuromuscular disorder (HCC)    neuropathy  . Neuropathy   . Obesity   . Oligomenorrhea 2011  . PE (pulmonary embolism)    2009 - on oral contraception; multiple  . Post - coital bleeding 2012  . Seizures ([place]) 2015   last episode May 2015  . Sleep apnea   . SORE THROAT 08/10/2010   Qualifier: Diagnosis of  By: Vanessa Kick MD, Saralyn Pilar    . Status post placement  of implantable loop recorder   . Stroke [facility])    TIA 2013  . Syncopal episodes    unknown etiology  . Thyroid disease    hypothyroidism (pt denies)  . Vaginal Pap smear, abnormal   . Venous insufficiency   . Vitamin D deficiency    unknown to pt.    Social History   Social History  . Marital status: Married    Spouse name: N/A  . Number of children: N/A  . Years of education: N/A   Occupational History  . 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 Smoking status: Never Smoker  . Smokeless tobacco: Never Used  . Alcohol use No  . Drug use: No  . Sexual activity: Yes    Birth control/ protection: Surgical   Other Topics Concern  . Not on file   Social History Narrative   Lives in [place]   Lives with significant other, Gwyndolyn Saxon   Completed 10th grade.   Worker Compensation Case 2009-2012 related to PE         Epworth Sleepiness Scale      Total score:  13      --I have high BP   --I have had a stroke   --I have had insomnia   --I have been told that I stop breathing during sleep   --I wake up to urinate frequently at night   --I wake up with a dry mouth and a sore throat   --I have Diabetes   --I have been told that I snore   --I am overweight or am gaining weight   --I have problems with memory/concentration   --I awake feeling not rested   --I frequently awake with headaches   --I have dozed off or fallen asleep at inappropriate times during the day     Family History  Problem Relation Age of Onset  . Melanoma Father 40  . Diabetes Father   . Hypertension Father   . Kidney disease Father   . Ulcerative colitis Mother 76  . Diabetes Mother   . Hypertension Mother   . Breast cancer Paternal Grandmother   . Cancer Paternal Grandmother   . Diabetes type II Maternal Grandmother        deceased 28  . Diabetes Maternal Grandmother   . Pulmonary embolism Paternal Grandfather   . Stroke Maternal  Grandfather      Review of Systems: General: negative for chills, fever, night sweats or weight changes.  Cardiovascular: negative for chest pain, dyspnea on exertion, edema, orthopnea, palpitations, paroxysmal nocturnal dyspnea or shortness  of breath Dermatological: negative for rash Respiratory: negative for cough or wheezing Urologic: negative for hematuria Abdominal: negative for nausea, vomiting, diarrhea, bright red blood per rectum, melena, or hematemesis Neurologic: negative for visual changes, syncope, or dizziness All other systems reviewed and are otherwise negative except as noted above.    Blood pressure 120/82, pulse 100, height 5' (1.524 m), weight 158 lb 3.2 oz (71.8 kg), last menstrual period 11/06/2012.  General appearance: alert, cooperative and mildly obese Skin: Skin color, texture, turgor normal. No rashes or lesions Neurologic: Grossly normal  EKG NSR  ASSESSMENT AND PLAN:   Syncope and collapse Loop recorder placed 2012-2014- no significant arrhythmia and pt subsequently diagnosed with seizures. Recent episode of syncope 08/16/17 prompting ED visit to [facility] and referral here. Episode suspected to be secondary to dehydration, hypotension, hyperdynamic LVH with LVOT.  Seizure disorder [facility]) She sees a neurologist in [place]  Chronic chest pain 06/19/2014  Chest pains, never resolved since she had a PE in 2009.   Atypical for angina, worse with deep inspiration. Followed by pulmonary at [facility]. History of negative stress echo in 2016  Insulin dependent diabetes mellitus with complications (HCC) Labile BS according to patient "40-220" Being followed by her PCP at [facility] who is adjusting her medications  Essential hypertension Labile B/P. Compliance issues- no Amlodipine "for months"  PLAN  I recommended she continue her medications as prescribed and avoid dehydration. She told me she'll "call when it happens again".   Kerin Ransom  PA-C 10/10/2017 4:03 PM

## 2017-10-20 ENCOUNTER — Encounter (HOSPITAL_COMMUNITY): Payer: Self-pay

## 2017-10-20 ENCOUNTER — Emergency Department (HOSPITAL_COMMUNITY): Payer: Medicare Other

## 2017-10-20 ENCOUNTER — Other Ambulatory Visit: Payer: Self-pay

## 2017-10-20 ENCOUNTER — Emergency Department (HOSPITAL_COMMUNITY)
Admission: EM | Admit: 2017-10-20 | Discharge: 2017-10-20 | Disposition: A | Discharge status: Home / Self Care | Payer: Medicare Other | Attending: Emergency Medicine | Admitting: Emergency Medicine

## 2017-10-20 DIAGNOSIS — R358 Other polyuria: Secondary | ICD-10-CM | POA: Diagnosis not present

## 2017-10-20 DIAGNOSIS — Z79899 Other long term (current) drug therapy: Secondary | ICD-10-CM | POA: Insufficient documentation

## 2017-10-20 DIAGNOSIS — R399 Unspecified symptoms and signs involving the genitourinary system: Secondary | ICD-10-CM | POA: Diagnosis not present

## 2017-10-20 DIAGNOSIS — D649 Anemia, unspecified: Secondary | ICD-10-CM | POA: Diagnosis not present

## 2017-10-20 DIAGNOSIS — Z794 Long term (current) use of insulin: Secondary | ICD-10-CM | POA: Insufficient documentation

## 2017-10-20 DIAGNOSIS — J45909 Unspecified asthma, uncomplicated: Secondary | ICD-10-CM | POA: Diagnosis not present

## 2017-10-20 DIAGNOSIS — I10 Essential (primary) hypertension: Secondary | ICD-10-CM | POA: Insufficient documentation

## 2017-10-20 DIAGNOSIS — Z8673 Personal history of transient ischemic attack (TIA), and cerebral infarction without residual deficits: Secondary | ICD-10-CM | POA: Insufficient documentation

## 2017-10-20 DIAGNOSIS — E1165 Type 2 diabetes mellitus with hyperglycemia: Secondary | ICD-10-CM | POA: Insufficient documentation

## 2017-10-20 DIAGNOSIS — Z91040 Latex allergy status: Secondary | ICD-10-CM | POA: Diagnosis not present

## 2017-10-20 DIAGNOSIS — R1030 Lower abdominal pain, unspecified: Secondary | ICD-10-CM

## 2017-10-20 DIAGNOSIS — Z7901 Long term (current) use of anticoagulants: Secondary | ICD-10-CM | POA: Diagnosis not present

## 2017-10-20 DIAGNOSIS — H538 Other visual disturbances: Secondary | ICD-10-CM | POA: Diagnosis not present

## 2017-10-20 DIAGNOSIS — R739 Hyperglycemia, unspecified: Secondary | ICD-10-CM

## 2017-10-20 DIAGNOSIS — E785 Hyperlipidemia, unspecified: Secondary | ICD-10-CM | POA: Diagnosis not present

## 2017-10-20 LAB — URINALYSIS, ROUTINE W REFLEX MICROSCOPIC
Bacteria, UA: NONE SEEN
Bilirubin Urine: NEGATIVE
Glucose, UA: >=500 mg/dL — AB
Hgb urine dipstick: NEGATIVE
Ketones, ur: 20 mg/dL — AB
Leukocytes, UA: NEGATIVE
Nitrite: NEGATIVE
Protein, ur: NEGATIVE mg/dL
Specific Gravity, Urine: 1.032 — ABNORMAL HIGH (ref 1.005–1.030)
pH: 5 (ref 5.0–8.0)

## 2017-10-20 LAB — CBC
HCT: 43.7 % (ref 36.0–46.0)
Hemoglobin: 14.8 g/dL (ref 12.0–15.0)
MCH: 26.3 pg (ref 26.0–34.0)
MCHC: 33.9 g/dL (ref 30.0–36.0)
MCV: 77.6 fL — ABNORMAL LOW (ref 78.0–100.0)
Platelets: 306 10*3/uL (ref 150–400)
RBC: 5.63 MIL/uL — ABNORMAL HIGH (ref 3.87–5.11)
RDW: 13.6 % (ref 11.5–15.5)
WBC: 12.5 10*3/uL — ABNORMAL HIGH (ref 4.0–10.5)

## 2017-10-20 LAB — COMPREHENSIVE METABOLIC PANEL
ALT: 20 U/L (ref 14–54)
AST: 19 U/L (ref 15–41)
Albumin: 4 g/dL (ref 3.5–5.0)
Alkaline Phosphatase: 77 U/L (ref 38–126)
Anion gap: 14 (ref 5–15)
BUN: 15 mg/dL (ref 6–20)
CO2: 27 mmol/L (ref 22–32)
Calcium: 9.8 mg/dL (ref 8.9–10.3)
Chloride: 88 mmol/L — ABNORMAL LOW (ref 101–111)
Creatinine, Ser: 1.15 mg/dL — ABNORMAL HIGH (ref 0.44–1.00)
GFR calc Af Amer: >60 mL/min (ref >60)
GFR calc non Af Amer: 57 mL/min — ABNORMAL LOW (ref >60)
Glucose, Bld: 531 mg/dL (ref 65–99)
Potassium: 4.8 mmol/L (ref 3.5–5.1)
Sodium: 129 mmol/L — ABNORMAL LOW (ref 135–145)
Total Bilirubin: 0.5 mg/dL (ref 0.3–1.2)
Total Protein: 8.3 g/dL — ABNORMAL HIGH (ref 6.5–8.1)

## 2017-10-20 LAB — LIPASE, BLOOD: Lipase: 70 U/L — ABNORMAL HIGH (ref 11–51)

## 2017-10-20 LAB — WET PREP, GENITAL
Clue Cells Wet Prep HPF POC: NONE SEEN
Sperm: NONE SEEN
Trich, Wet Prep: NONE SEEN
Yeast Wet Prep HPF POC: NONE SEEN

## 2017-10-20 LAB — CBG MONITORING, ED: Glucose-Capillary: 267 mg/dL — ABNORMAL HIGH (ref 65–99)

## 2017-10-20 MED ORDER — ONDANSETRON HCL 4 MG PO TABS: 4.0000 mg | ORAL_TABLET | Freq: Four times a day (QID) | ORAL | 0 refills | Status: DC

## 2017-10-20 MED ORDER — CEPHALEXIN 500 MG PO CAPS: 500.0000 mg | ORAL_CAPSULE | Freq: Four times a day (QID) | ORAL | 0 refills | Status: AC

## 2017-10-20 MED ORDER — FLUCONAZOLE 200 MG PO TABS: 200.0000 mg | ORAL_TABLET | Freq: Once | ORAL | 0 refills | Status: AC

## 2017-10-20 MED ORDER — ONDANSETRON 4 MG PO TBDP
4.0000 mg | ORAL_TABLET | Freq: Once | ORAL | Status: AC | PRN
  Administered 2017-10-20: 4 mg via ORAL
  Filled 2017-10-20: qty 1

## 2017-10-20 MED ORDER — SODIUM CHLORIDE 0.9 % IV BOLUS (SEPSIS)
1000.0000 mL | Freq: Once | INTRAVENOUS | Status: AC
  Administered 2017-10-20: 1000 mL via INTRAVENOUS

## 2017-10-20 MED ORDER — IOPAMIDOL (ISOVUE-300) INJECTION 61%
INTRAVENOUS | Status: AC
  Administered 2017-10-20: 100 mL
  Filled 2017-10-20: qty 100

## 2017-10-20 NOTE — ED Notes (Signed)
Patient transported to CT 

## 2017-10-20 NOTE — Discharge Instructions (Signed)
Please take all of your antibiotics until finished!   You may develop abdominal discomfort or diarrhea from the antibiotic.  You may help offset this with probiotics which you can buy or get in yogurt. Do not eat  or take the probiotics until 2 hours after your antibiotic.   Take Zofran as needed for nausea.  Follow-up with your primary care physician or endocrinologist for reevaluation of your high blood sugar and to retest your urine.  Return to the ED immediately if any concerning signs or symptoms develop such as fever, worsening pain, numbness or weakness, or passing out.

## 2017-10-20 NOTE — ED Triage Notes (Signed)
Pt is c/o pain 10/10  Pressure in the suprapubic area pt reports that she had a bladder mesh surgery 2-3 years ago , Pt reports that she has had pain intermittently , however this time pain has not gone away and has exacerbated in the past 2 days. . Pt reports burning with urination. Denies hematuria. Pt reports that she is unable to ambulate d/t severe pain . Pt does report nausea

## 2017-10-20 NOTE — ED Notes (Signed)
Bed: [facility]1 Expected date:  Expected time:  Means of arrival:  Comments: 

## 2017-10-20 NOTE — ED Provider Notes (Signed)
[facility] DEPT Provider Note   CSN: [id number] Arrival date & time: 10/20/17  1433     History   Chief Complaint Chief Complaint  Patient presents with  . Abdominal Pain    HPI Makayla Frazier is a 46 y.o. female with history of DM, DVT and PE on chronic anticoagulation, HLD, HTN, TIA 2013, and hypothyroidism who presents today with chief complaint acute onset, progressively worsening lower abdominal pain for 2 days.  She states pain is primarily localized to the pelvic region and is a severe pressure which radiates to the back.  Pain worsens with ambulation and improved somewhat with staying still and laying down.  Endorses intermittent lightheadedness and blurry vision as well as polyuria and polydipsia for 1 week.  Denies hematuria but endorses dysuria at the end of her stream.  States she has been taking her diabetes medications as prescribed.  States that her glucose has been "running high "for the past 2-3 months, averaging in the 200s-300s.  Endorses mild vaginal itching but denies discharge, bleeding, or pain. She is currently sexually active with her husband only. Denies fevers, chills, chest pain, or shortness of breath.   She is status post hysterectomy and salpingectomy as well as status post bladder mesh surgery which required removal of the mesh 30 days after placement.  She states that she has had continual intermittent lower pelvic pain for years since this procedure but her current symptoms are persistent and severe.    Abdominal Pain   Associated symptoms include nausea and dysuria. Pertinent negatives include fever, diarrhea, vomiting, constipation and hematuria.    Past Medical History:  Diagnosis Date  . Anemia   . Arthritis    Left leg  . Asthma   . BV (bacterial vaginosis) 2012  . Complication of anesthesia    Seizures in PACU after surgery 3/15  . DM (diabetes mellitus) ([place])    diagnosed 2009  . DVT (deep venous  thrombosis) ([place]) 2011   pt had IUD; also 05/2012  . Dysfunctional uterine bleeding    Seen by Dr. Leo Grosser, GYN; pending hysterectomy as of 07/2012  . H/O hypercoagulable state    2/2 contraception  . Headache(784.0)    migraines  . Herpes simplex without mention of complication 12/9507   HSV-2  . HLD (hyperlipidemia)   . Hypertension   . Infection    UTI  . Labial lesion 2013  . Major depression    Hx suicide attempt in 2009, [facility] admissions  . Mastodynia   . Neuromuscular disorder (HCC)    neuropathy  . Neuropathy   . Obesity   . Oligomenorrhea 2011  . PE (pulmonary embolism)    2009 - on oral contraception; multiple  . Post - coital bleeding 2012  . Seizures ([place]) 2015   last episode May 2015  . Sleep apnea   . SORE THROAT 08/10/2010   Qualifier: Diagnosis of  By: Vanessa Kick MD, Saralyn Pilar    . Status post placement of implantable loop recorder   . Stroke [facility])    TIA 2013  . Syncopal episodes    unknown etiology  . Thyroid disease    hypothyroidism (pt denies)  . Vaginal Pap smear, abnormal   . Venous insufficiency   . Vitamin D deficiency    unknown to pt.    Patient Active Problem List   Diagnosis Date Noted  . Reaction to severe stress 03/07/2016  . Bleeding per rectum 11/29/2015  . Acute pansinusitis 10/20/2015  .  Candida vaginitis 10/20/2015  . Cerebrovascular accident, late effects 08/30/2015  . D (diarrhea) 08/02/2015  . Seizure disorder ([place]) 07/13/2015  . Adnexal pain 06/08/2015  . Right flank pain 05/30/2015  . Chronic pulmonary embolism ([place]) 05/14/2015  . Long term current use of anticoagulant 05/14/2015  . Seizures ([place]) 05/07/2014  . Absence of bladder continence 03/27/2014  . Arthropathia 02/25/2014  . Chronic chest pain 02/25/2014  . Healed or old pulmonary embolism 02/25/2014  . HLD (hyperlipidemia) 02/25/2014  . Headache, migraine 02/25/2014  . Obstructive apnea 02/25/2014  . Type 2 diabetes mellitus ([place]) 02/25/2014  . Incontinence  02/13/2014  . S/P vaginal hysterectomy 01/29/2013  . TIA (transient ischemic attack) 08/03/2012  . Left-sided weakness 08/03/2012  . Hypokalemia 08/03/2012  . Chest pain 08/03/2012  . Temporary cerebral vascular dysfunction 08/03/2012  . History of pulmonary embolism 05/18/2012  . Patellar tendinitis of left knee 08/21/2011  . Knee pain, left 07/07/2011  . PRURITUS 07/04/2010  . Syncope and collapse 10/18/2009  . Allergic rhinitis 08/11/2009  . Unspecified vitamin D deficiency 07/21/2009  . Adiposity 07/19/2009  . Apnea, sleep 07/19/2009  . ANEMIA 07/10/2008  . Essential hypertension 07/08/2008  . Insulin dependent diabetes mellitus with complications ([place]) 21/30/8657  . NEUROPATHY 01/22/2008  . Asthma 01/22/2008  . Mononeuritis 01/22/2008    Past Surgical History:  Procedure Laterality Date  . BREAST REDUCTION SURGERY     Dr. Eugene [facility] 2011  . CARDIAC ELECTROPHYSIOLOGY STUDY & DFT     Implantable Loop recorder; no arrhythmias associated with synocpal spells; loop explanted 07-2013  . CHOLECYSTECTOMY    . DILATATION & CURRETTAGE/HYSTEROSCOPY WITH RESECTOCOPE  2007 & 2013  . ENDOMETRIAL BIOPSY  2011  . WISDOM TOOTH EXTRACTION      OB History    Gravida Para Term Preterm AB Living   0 0           SAB TAB Ectopic Multiple Live Births                   Home Medications    Prior to Admission medications   Medication Sig Start Date End Date Taking? Authorizing Provider  acetaminophen (TYLENOL) 500 MG tablet Take 1,000 mg by mouth every 6 (six) hours as needed for mild pain, moderate pain, fever or headache.    Yes [provider]  albuterol (PROVENTIL HFA;VENTOLIN HFA) 108 (90 BASE) MCG/ACT inhaler Inhale 2 puffs into the lungs every 6 (six) hours as needed for wheezing or shortness of breath.   Yes [provider]  amLODipine (NORVASC) 10 MG tablet Take 10 mg by mouth daily.   Yes [provider]  atorvastatin (LIPITOR) 80 MG tablet Take  80 mg by mouth daily. 12/01/15  Yes [provider]  budesonide-formoterol (SYMBICORT) 80-4.5 MCG/ACT inhaler Inhale 2 puffs into the lungs 2 (two) times daily.   Yes [provider]  chlorthalidone (HYGROTON) 25 MG tablet TAKE 1 TABLET(25 MG) BY MOUTH DAILY 07/06/17  Yes Skeet Latch, MD  fluticasone [facility]) 50 MCG/ACT nasal spray Place 1 spray into both nostrils daily as needed (for nasal congestion/sinus infection).  10/18/15  Yes [provider]  insulin regular human CONCENTRATED (HUMULIN R U-500 KWIKPEN) 500 UNIT/ML kwikpen Inject 120 Units 3 (three) times daily with meals into the skin.  02/16/17  Yes [provider]  levETIRAcetam (KEPPRA) 500 MG tablet Take 500 mg by mouth 2 (two) times daily. 01/14/15 10/20/17 Yes [provider]  lisinopril (PRINIVIL,ZESTRIL) 40 MG tablet TAKE  1 TABLET(40 MG) BY MOUTH DAILY Patient taking differently: TAKE 1 TABLET(40 MG) BY MOUTH DAILY AT BEDTIME 07/06/17  Yes Skeet Latch, MD  loratadine (CLARITIN) 10 MG tablet Take 10 mg by mouth at bedtime.  08/04/13  Yes [provider]  montelukast (SINGULAIR) 10 MG tablet Take 10 mg by mouth at bedtime. Reported on 02/16/2016 04/13/15  Yes [provider]  ondansetron (ZOFRAN ODT) 4 MG disintegrating tablet 4mg  ODT q6 hours prn nausea/vomit Patient taking differently: Take 4 mg by mouth every 6 (six) hours as needed for nausea or vomiting.  12/09/15  Yes Etta Quill, NP  Rivaroxaban (XARELTO) 20 MG TABS Take 20 mg by mouth at bedtime.    Yes [provider]  rizatriptan (MAXALT-MLT) 5 MG disintegrating tablet Take 1 tablet (5 mg total) by mouth as needed for migraine. May repeat in 2 hours if needed 05/07/14  Yes Marcial Pacas, MD  cephALEXin (KEFLEX) 500 MG capsule Take 1 capsule (500 mg total) 4 (four) times daily for 7 days by mouth. 10/20/17 10/27/17  Nils Flack, Lenis Nettleton A, PA-C  fluconazole (DIFLUCAN) 200 MG tablet Take 1 tablet (200 mg total) once for  1 dose by mouth. 10/20/17 10/20/17  Nils Flack, Lisa-Marie Rueger A, PA-C  ondansetron (ZOFRAN) 4 MG tablet Take 1 tablet (4 mg total) every 6 (six) hours by mouth. 10/20/17   Renita Papa, PA-C    Family History Family History  Problem Relation Age of Onset  . Melanoma Father 29  . Diabetes Father   . Hypertension Father   . Kidney disease Father   . Ulcerative colitis Mother 70  . Diabetes Mother   . Hypertension Mother   . Breast cancer Paternal Grandmother   . Cancer Paternal Grandmother   . Diabetes type II Maternal Grandmother        deceased 39  . Diabetes Maternal Grandmother   . Pulmonary embolism Paternal Grandfather   . Stroke Maternal Grandfather     Social History Social History   Tobacco Use  . Smoking status: Never Smoker  . Smokeless tobacco: Never Used  Substance Use Topics  . Alcohol use: No  . Drug use: No     Allergies   Codeine; Darvocet [propoxyphene n-acetaminophen]; Hydrocodone-acetaminophen; Hydrocodone-acetaminophen; Latex; Tape; and Tramadol   Review of Systems Review of Systems  Constitutional: Negative for chills and fever.  Eyes: Positive for visual disturbance.  Respiratory: Negative for shortness of breath.   Cardiovascular: Negative for chest pain.  Gastrointestinal: Positive for abdominal pain and nausea. Negative for blood in stool, constipation, diarrhea and vomiting.  Endocrine: Positive for polydipsia and polyuria.  Genitourinary: Positive for dysuria. Negative for hematuria, vaginal bleeding, vaginal discharge and vaginal pain.  Musculoskeletal: Positive for back pain.  Neurological: Positive for light-headedness.  All other systems reviewed and are negative.    Physical Exam Updated Vital Signs BP 126/75   Pulse 79   Temp 98.8 F (37.1 C) (Oral)   Resp 16   LMP 11/06/2012   SpO2 99%   Physical Exam  Constitutional: She appears well-developed and well-nourished. No distress.  HENT:  Head: Normocephalic and atraumatic.  Eyes:  Conjunctivae and EOM are normal. Pupils are equal, round, and reactive to light. Right eye exhibits no discharge. Left eye exhibits no discharge.  Neck: No JVD present. No tracheal deviation present.  Cardiovascular: Normal rate, normal heart sounds and intact distal pulses.  2+ radial and DP/PT pulses bl, no calf swelling noted   Pulmonary/Chest: Effort normal and breath sounds  normal.  Abdominal: Soft. Normal appearance and bowel sounds are normal. She exhibits no distension. There is generalized tenderness. There is guarding. There is no tenderness at McBurney's point and negative Murphy's sign.  Diffuse tenderness to palpation, worse in the lower abdomen.  No CVA tenderness.  Murphy sign absent, Rovsing's absent  Genitourinary:  Genitourinary Comments: Examination performed in the presence of chaperone.  No masses or lesions to the external genitalia.  Copious white discharge in the vaginal vault.  Cervix is surgically absent.  She has tenderness on insertion of the fingers and movement anteriorly.  No adnexal tenderness.  Musculoskeletal: She exhibits no edema.  Neurological: She is alert.  Fluent speech, no facial droop  Skin: Skin is warm and dry. No erythema.  Psychiatric: She has a normal mood and affect. Her behavior is normal.  Nursing note and vitals reviewed.    ED Treatments / Results  Labs (all labs ordered are listed, but only abnormal results are displayed) Labs Reviewed  WET PREP, GENITAL - Abnormal; Notable for the following components:      Result Value   WBC, Wet Prep HPF POC FEW (*)    All other components within normal limits  LIPASE, BLOOD - Abnormal; Notable for the following components:   Lipase 70 (*)    All other components within normal limits  COMPREHENSIVE METABOLIC PANEL - Abnormal; Notable for the following components:   Sodium 129 (*)    Chloride 88 (*)    Glucose, Bld 531 (*)    Creatinine, Ser 1.15 (*)    Total Protein 8.3 (*)    GFR calc non Af  Amer 57 (*)    All other components within normal limits  CBC - Abnormal; Notable for the following components:   WBC 12.5 (*)    RBC 5.63 (*)    MCV 77.6 (*)    All other components within normal limits  URINALYSIS, ROUTINE W REFLEX MICROSCOPIC - Abnormal; Notable for the following components:   Color, Urine STRAW (*)    Specific Gravity, Urine 1.032 (*)    Glucose, UA >=500 (*)    Ketones, ur 20 (*)    Squamous Epithelial / LPF 0-5 (*)    All other components within normal limits    EKG  EKG Interpretation None       Radiology Ct Abdomen Pelvis W Contrast  Result Date: 10/20/2017 CLINICAL DATA:  Pressure in the suprapubic area pt reports that she had a bladder mesh surgery 2-3 years ago , Pt reports that she has had pain intermittently , however this time pain has not gone away and has exacerbated in the past 2 days. P.*comment was truncated*^13mL ISOVUE-300 IOPAMIDOL (ISOVUE-300) INJECTION 61% EXAM: CT ABDOMEN AND PELVIS WITH CONTRAST TECHNIQUE: Multidetector CT imaging of the abdomen and pelvis was performed using the standard protocol following bolus administration of intravenous contrast. CONTRAST:  191mL ISOVUE-300 IOPAMIDOL (ISOVUE-300) INJECTION 61% COMPARISON:  CT 01/03/2008 FINDINGS: Lower chest: Lung bases are clear. Hepatobiliary: Diffuse low-attenuation liver consistent with hepatic steatosis. No focal hepatic lesion. No duct dilatation. Postcholecystectomy. Pancreas: Pancreas is normal. No ductal dilatation. No pancreatic inflammation. Spleen: Normal spleen Adrenals/urinary tract: Adrenal glands and kidneys are normal. The ureters and bladder normal. No abnormality of the bladder. No explanation for suprapubic pain. Note ventral mesh identified Stomach/Bowel: Stomach, small bowel, appendix, and cecum are normal. The colon and rectosigmoid colon are normal. Vascular/Lymphatic: Abdominal aorta is normal caliber. There is no retroperitoneal or periportal lymphadenopathy. No  pelvic  lymphadenopathy. Reproductive: Post hysterectomy anatomy. 2 adjacent cysts in the LEFT ovary measure 2.7 and 2.5 cm. RIGHT ovary is normal at 3.0 x 2.5 cm. Other: No free fluid. Musculoskeletal: No aggressive osseous lesion. IMPRESSION: 1. No explanation suprapubic pain.  Bladder appears normal. 2. Post hysterectomy. Cysts in the LEFT ovary are within normal limits for premenopausal female. 3. Hepatic steatosis. Electronically Signed   By: Suzy Bouchard M.D.   On: 10/20/2017 19:16    Procedures Procedures (including critical care time)  Medications Ordered in ED Medications  sodium chloride 0.9 % bolus 1,000 mL (not administered)  ondansetron (ZOFRAN-ODT) disintegrating tablet 4 mg (4 mg Oral Given 10/20/17 1558)  sodium chloride 0.9 % bolus 1,000 mL (1,000 mLs Intravenous New Bag/Given 10/20/17 1724)  iopamidol (ISOVUE-300) 61 % injection (100 mLs  Contrast Given 10/20/17 1843)     Initial Impression / Assessment and Plan / ED Course  I have reviewed the triage vital signs and the nursing notes.  Pertinent labs & imaging results that were available during my care of the patient were reviewed by me and considered in my medical decision making (see chart for details).     Patient with lower abdominal pain and urinary symptoms for 2 days.  Afebrile, vital signs are stable.  Found to be hyperglycemic, but normal bicarb and normal anion gap.  I doubt DKA, although I suspect some process contributing to her hyperglycemia.  This is likely contributing to her lightheadedness, polydipsia, and polyuria.  Wet prep shows WBCs but no evidence of yeast or clue cells.  She is low risk for STD and she is status post hysterectomy so I have a low suspicion of PID.  UA shows large glucosuria but no other abnormalities.  Remainder of her lab work is unremarkable.  CT scan shows no acute processes.  Doubt obstruction, perforation, appendicitis, or other acute surgical abdominal pathology.  Given urinary  symptoms in a diabetic patient, will treat for UTI.  UA sent off for culture.  Her glucose improved significantly after administration of 2 L of normal saline 267. she stable for discharge home with close follow-up with primary care physician and endocrinologist for medication management of her diabetes.  Discussed indications for return to the ED.  We will also discharged with Zofran for nausea and Diflucan as she is prone to yeast infections after antibiotics. Pt verbalized understanding of and agreement with plan and is safe for discharge home at this time.  Repeat abdominal examination is unremarkable and she is tolerating p.o. without difficulty.  She has no complaints prior to discharge.  Final Clinical Impressions(s) / ED Diagnoses   Final diagnoses:  Lower abdominal pain  Urinary tract infection symptoms  Hyperglycemia    ED Discharge Orders        Ordered    ondansetron (ZOFRAN) 4 MG tablet  Every 6 hours     10/20/17 1932    cephALEXin (KEFLEX) 500 MG capsule  4 times daily     10/20/17 1934    fluconazole (DIFLUCAN) 200 MG tablet   Once     10/20/17 1934       Renita Papa, PA-C 10/20/17 1940    Debroah Baller 10/20/17 2110    Virgel Manifold, MD 10/23/17 1238

## 2017-10-25 LAB — URINE CULTURE: Culture: NO GROWTH

## 2018-02-26 ENCOUNTER — Encounter (HOSPITAL_BASED_OUTPATIENT_CLINIC_OR_DEPARTMENT_OTHER): Payer: Self-pay

## 2018-02-26 ENCOUNTER — Emergency Department (HOSPITAL_BASED_OUTPATIENT_CLINIC_OR_DEPARTMENT_OTHER)
Admission: EM | Admit: 2018-02-26 | Discharge: 2018-02-27 | Disposition: A | Discharge status: Home / Self Care | Payer: No Typology Code available for payment source | Attending: Emergency Medicine | Admitting: Emergency Medicine

## 2018-02-26 ENCOUNTER — Other Ambulatory Visit: Payer: Self-pay

## 2018-02-26 ENCOUNTER — Emergency Department (HOSPITAL_BASED_OUTPATIENT_CLINIC_OR_DEPARTMENT_OTHER): Payer: No Typology Code available for payment source

## 2018-02-26 DIAGNOSIS — S8992XA Unspecified injury of left lower leg, initial encounter: Secondary | ICD-10-CM

## 2018-02-26 DIAGNOSIS — I10 Essential (primary) hypertension: Secondary | ICD-10-CM | POA: Diagnosis not present

## 2018-02-26 DIAGNOSIS — Z79899 Other long term (current) drug therapy: Secondary | ICD-10-CM | POA: Diagnosis not present

## 2018-02-26 DIAGNOSIS — Y999 Unspecified external cause status: Secondary | ICD-10-CM | POA: Diagnosis not present

## 2018-02-26 DIAGNOSIS — Y9389 Activity, other specified: Secondary | ICD-10-CM | POA: Diagnosis not present

## 2018-02-26 DIAGNOSIS — Z8673 Personal history of transient ischemic attack (TIA), and cerebral infarction without residual deficits: Secondary | ICD-10-CM | POA: Insufficient documentation

## 2018-02-26 DIAGNOSIS — J45909 Unspecified asthma, uncomplicated: Secondary | ICD-10-CM | POA: Insufficient documentation

## 2018-02-26 DIAGNOSIS — Z7901 Long term (current) use of anticoagulants: Secondary | ICD-10-CM | POA: Diagnosis not present

## 2018-02-26 DIAGNOSIS — E119 Type 2 diabetes mellitus without complications: Secondary | ICD-10-CM | POA: Diagnosis not present

## 2018-02-26 DIAGNOSIS — Y92410 Unspecified street and highway as the place of occurrence of the external cause: Secondary | ICD-10-CM | POA: Diagnosis not present

## 2018-02-26 DIAGNOSIS — S299XXA Unspecified injury of thorax, initial encounter: Secondary | ICD-10-CM

## 2018-02-26 DIAGNOSIS — Z794 Long term (current) use of insulin: Secondary | ICD-10-CM | POA: Insufficient documentation

## 2018-02-26 DIAGNOSIS — Z91040 Latex allergy status: Secondary | ICD-10-CM | POA: Diagnosis not present

## 2018-02-26 MED ORDER — NAPROXEN 250 MG PO TABS
500.0000 mg | ORAL_TABLET | Freq: Once | ORAL | Status: AC
  Administered 2018-02-26: 500 mg via ORAL
  Filled 2018-02-26: qty 2

## 2018-02-26 NOTE — ED Notes (Signed)
ED Provider at bedside. 
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
## 2018-02-26 NOTE — ED Notes (Signed)
Left calf is tender to touch.  No bruise, no open skin area.

## 2018-05-28 ENCOUNTER — Emergency Department (HOSPITAL_BASED_OUTPATIENT_CLINIC_OR_DEPARTMENT_OTHER): Payer: Medicare Other

## 2018-05-28 ENCOUNTER — Encounter (HOSPITAL_COMMUNITY): Payer: Self-pay

## 2018-05-28 ENCOUNTER — Emergency Department (HOSPITAL_COMMUNITY)
Admission: EM | Admit: 2018-05-28 | Discharge: 2018-05-28 | Disposition: A | Discharge status: Home / Self Care | Payer: Medicare Other | Attending: Emergency Medicine | Admitting: Emergency Medicine

## 2018-05-28 ENCOUNTER — Other Ambulatory Visit: Payer: Self-pay

## 2018-05-28 DIAGNOSIS — I10 Essential (primary) hypertension: Secondary | ICD-10-CM | POA: Insufficient documentation

## 2018-05-28 DIAGNOSIS — Z91040 Latex allergy status: Secondary | ICD-10-CM | POA: Insufficient documentation

## 2018-05-28 DIAGNOSIS — R0602 Shortness of breath: Secondary | ICD-10-CM | POA: Insufficient documentation

## 2018-05-28 DIAGNOSIS — R2243 Localized swelling, mass and lump, lower limb, bilateral: Secondary | ICD-10-CM | POA: Diagnosis present

## 2018-05-28 DIAGNOSIS — M79609 Pain in unspecified limb: Secondary | ICD-10-CM | POA: Diagnosis not present

## 2018-05-28 DIAGNOSIS — Z8673 Personal history of transient ischemic attack (TIA), and cerebral infarction without residual deficits: Secondary | ICD-10-CM | POA: Insufficient documentation

## 2018-05-28 DIAGNOSIS — J45909 Unspecified asthma, uncomplicated: Secondary | ICD-10-CM | POA: Diagnosis not present

## 2018-05-28 DIAGNOSIS — Z79899 Other long term (current) drug therapy: Secondary | ICD-10-CM | POA: Diagnosis not present

## 2018-05-28 DIAGNOSIS — Z7901 Long term (current) use of anticoagulants: Secondary | ICD-10-CM | POA: Diagnosis not present

## 2018-05-28 DIAGNOSIS — R739 Hyperglycemia, unspecified: Secondary | ICD-10-CM

## 2018-05-28 DIAGNOSIS — Z794 Long term (current) use of insulin: Secondary | ICD-10-CM | POA: Insufficient documentation

## 2018-05-28 DIAGNOSIS — E1165 Type 2 diabetes mellitus with hyperglycemia: Secondary | ICD-10-CM | POA: Diagnosis not present

## 2018-05-28 LAB — CBC WITH DIFFERENTIAL/PLATELET
BASOS PCT: 0 %
Basophils Absolute: 0 10*3/uL (ref 0.0–0.1)
Eosinophils Absolute: 0.1 10*3/uL (ref 0.0–0.7)
Eosinophils Relative: 1 %
HEMATOCRIT: 36.5 % (ref 36.0–46.0)
Hemoglobin: 11.8 g/dL — ABNORMAL LOW (ref 12.0–15.0)
Lymphocytes Relative: 52 %
Lymphs Abs: 4.6 10*3/uL — ABNORMAL HIGH (ref 0.7–4.0)
MCH: 26.2 pg (ref 26.0–34.0)
MCHC: 32.3 g/dL (ref 30.0–36.0)
MCV: 81.1 fL (ref 78.0–100.0)
MONO ABS: 0.6 10*3/uL (ref 0.1–1.0)
MONOS PCT: 7 %
NEUTROS ABS: 3.5 10*3/uL (ref 1.7–7.7)
Neutrophils Relative %: 40 %
Platelets: 430 10*3/uL — ABNORMAL HIGH (ref 150–400)
RBC: 4.5 MIL/uL (ref 3.87–5.11)
RDW: 14.6 % (ref 11.5–15.5)
WBC: 8.7 10*3/uL (ref 4.0–10.5)

## 2018-05-28 LAB — CBG MONITORING, ED
Glucose-Capillary: 444 mg/dL — ABNORMAL HIGH (ref 65–99)
Glucose-Capillary: 314 mg/dL — ABNORMAL HIGH (ref 65–99)

## 2018-05-28 LAB — BASIC METABOLIC PANEL
Anion gap: 9 (ref 5–15)
BUN: 10 mg/dL (ref 6–20)
CALCIUM: 9.8 mg/dL (ref 8.9–10.3)
CO2: 30 mmol/L (ref 22–32)
CREATININE: 0.85 mg/dL (ref 0.44–1.00)
Chloride: 97 mmol/L — ABNORMAL LOW (ref 101–111)
GFR calc non Af Amer: >60 mL/min (ref >60)
Glucose, Bld: 534 mg/dL (ref 65–99)
Potassium: 3.9 mmol/L (ref 3.5–5.1)
Sodium: 136 mmol/L (ref 135–145)

## 2018-05-28 LAB — D-DIMER, QUANTITATIVE: D-Dimer, Quant: <0.27 ug/mL-FEU (ref 0.00–0.50)

## 2018-05-28 MED ORDER — SODIUM CHLORIDE 0.9 % IV BOLUS
500.0000 mL | Freq: Once | INTRAVENOUS | Status: AC
  Administered 2018-05-28: 500 mL via INTRAVENOUS

## 2018-05-28 MED ORDER — INSULIN ASPART 100 UNIT/ML ~~LOC~~ SOLN
6.0000 [IU] | Freq: Once | SUBCUTANEOUS | Status: AC
  Administered 2018-05-28: 6 [IU] via INTRAVENOUS
  Filled 2018-05-28: qty 1

## 2018-05-28 NOTE — ED Notes (Signed)
Pt states she is insulin dependent but left insulin at home and went on vacation and has not taken insulin in 12 days. MD made aware.

## 2018-05-28 NOTE — ED Provider Notes (Signed)
[facility] DEPT Provider Note   CSN: [id number] Arrival date & time: 05/28/18  1522     History   Chief Complaint Chief Complaint  Patient presents with  . arm numbness  . Leg Pain  . Shortness of Breath    HPI Makayla Frazier is a 47 y.o. female.  47 year old female with prior history of diabetes, hypertension, PE, DVT, and hyperlipidemia who presents with complaint of lower extremity pain and swelling.  Patient reports recent long distance car trip across the country.  She complains of feeling like her legs are sore and swollen.  She denies chest pain or shortness of breath.  She is compliant with her prescribed Xarelto.  She is concerned that she may have obtained a recurrent DVT or PE given her prolonged immobility on a recent vacation.    The history is provided by the patient and medical records.  Leg Pain   This is a new problem. The current episode started more than 1 week ago. The problem occurs constantly. The problem has not changed since onset.The pain is present in the left lower leg and right lower leg. The quality of the pain is described as aching. The pain is mild. Pertinent negatives include no numbness, full range of motion, no stiffness, no tingling and no itching. She has tried nothing for the symptoms.  Shortness of Breath  Associated symptoms include leg pain and leg swelling.    Past Medical History:  Diagnosis Date  . Anemia   . Arthritis    Left leg  . Asthma   . BV (bacterial vaginosis) 2012  . Complication of anesthesia    Seizures in PACU after surgery 3/15  . DM (diabetes mellitus) ([place])    diagnosed 2009  . DVT (deep venous thrombosis) ([place]) 2011   pt had IUD; also 05/2012  . Dysfunctional uterine bleeding    Seen by Dr. Leo Grosser, GYN; pending hysterectomy as of 07/2012  . H/O hypercoagulable state    2/2 contraception  . Headache(784.0)    migraines  . Herpes simplex without mention of complication 04/3613     HSV-2  . HLD (hyperlipidemia)   . Hypertension   . Infection    UTI  . Labial lesion 2013  . Major depression    Hx suicide attempt in 2009, [facility] admissions  . Mastodynia   . Neuromuscular disorder (HCC)    neuropathy  . Neuropathy   . Obesity   . Oligomenorrhea 2011  . PE (pulmonary embolism)    2009 - on oral contraception; multiple  . Post - coital bleeding 2012  . Seizures ([place]) 2015   last episode May 2015  . Sleep apnea   . SORE THROAT 08/10/2010   Qualifier: Diagnosis of  By: Vanessa Kick MD, Saralyn Pilar    . Status post placement of implantable loop recorder   . Stroke [facility])    TIA 2013  . Syncopal episodes    unknown etiology  . Thyroid disease    hypothyroidism (pt denies)  . Vaginal Pap smear, abnormal   . Venous insufficiency   . Vitamin D deficiency    unknown to pt.    Patient Active Problem List   Diagnosis Date Noted  . Reaction to severe stress 03/07/2016  . Bleeding per rectum 11/29/2015  . Acute pansinusitis 10/20/2015  . Candida vaginitis 10/20/2015  . Cerebrovascular accident, late effects 08/30/2015  . D (diarrhea) 08/02/2015  . Seizure disorder ([place]) 07/13/2015  . Adnexal pain 06/08/2015  .  Right flank pain 05/30/2015  . Chronic pulmonary embolism ([place]) 05/14/2015  . Long term current use of anticoagulant 05/14/2015  . Seizures ([place]) 05/07/2014  . Absence of bladder continence 03/27/2014  . Arthropathia 02/25/2014  . Chronic chest pain 02/25/2014  . Healed or old pulmonary embolism 02/25/2014  . HLD (hyperlipidemia) 02/25/2014  . Headache, migraine 02/25/2014  . Obstructive apnea 02/25/2014  . Type 2 diabetes mellitus ([place]) 02/25/2014  . Incontinence 02/13/2014  . S/P vaginal hysterectomy 01/29/2013  . TIA (transient ischemic attack) 08/03/2012  . Left-sided weakness 08/03/2012  . Hypokalemia 08/03/2012  . Chest pain 08/03/2012  . Temporary cerebral vascular dysfunction 08/03/2012  . History of pulmonary embolism 05/18/2012  . Patellar  tendinitis of left knee 08/21/2011  . Knee pain, left 07/07/2011  . PRURITUS 07/04/2010  . Syncope and collapse 10/18/2009  . Allergic rhinitis 08/11/2009  . Unspecified vitamin D deficiency 07/21/2009  . Adiposity 07/19/2009  . Apnea, sleep 07/19/2009  . ANEMIA 07/10/2008  . Essential hypertension 07/08/2008  . Insulin dependent diabetes mellitus with complications ([place]) 87/56/4332  . NEUROPATHY 01/22/2008  . Asthma 01/22/2008  . Mononeuritis 01/22/2008    Past Surgical History:  Procedure Laterality Date  . BILATERAL SALPINGECTOMY  12/18/2012   Procedure: BILATERAL SALPINGECTOMY;  Surgeon: Eldred Manges, MD;  Location: [facility];  Service: Gynecology;  Laterality: Bilateral;  . BLADDER SUSPENSION N/A 02/13/2014   Procedure: TRANSVAGINAL TAPE (TVT) PROCEDURE;  Surgeon: Delice Lesch, MD;  Location: [facility];  Service: Gynecology;  Laterality: N/A;  . BREAST REDUCTION SURGERY     Dr. Eugene [facility] 2011  . CARDIAC ELECTROPHYSIOLOGY STUDY & DFT     Implantable Loop recorder; no arrhythmias associated with synocpal spells; loop explanted 07-2013  . CHOLECYSTECTOMY    . DILATATION & CURRETTAGE/HYSTEROSCOPY WITH RESECTOCOPE  2007 & 2013  . ENDOMETRIAL BIOPSY  2011  . IUD REMOVAL  12/18/2012   Procedure: INTRAUTERINE DEVICE (IUD) REMOVAL;  Surgeon: Eldred Manges, MD;  Location: [facility];  Service: Gynecology;  Laterality: N/A;  Removed during prep by E. Florene Glen PA  . LAPAROSCOPIC ASSISTED VAGINAL HYSTERECTOMY  12/18/2012   Procedure: LAPAROSCOPIC ASSISTED VAGINAL HYSTERECTOMY;  Surgeon: Eldred Manges, MD;  Location: [facility];  Service: Gynecology;  Laterality: N/A;  . LOOP RECORDER EXPLANT N/A 07/17/2013   Procedure: LOOP RECORDER EXPLANT;  Surgeon: Thompson Grayer, MD;  Location: [facility] CATH LAB;  Service: Cardiovascular;  Laterality: N/A;  . TRANSVAGINAL TAPE (TVT) REMOVAL N/A 04/28/2014   Procedure: Removal of suburethral mesh;  Surgeon: Delice Lesch, MD;  Location: [facility];  Service:  Gynecology;  Laterality: N/A;  . WISDOM TOOTH EXTRACTION       OB History    Gravida  0   Para  0   Term      Preterm      AB      Living        SAB      TAB      Ectopic      Multiple      Live Births               Home Medications    Prior to Admission medications   Medication Sig Start Date End Date Taking? Authorizing Provider  acetaminophen (TYLENOL) 500 MG tablet Take 1,000 mg by mouth every 6 (six) hours as needed for mild pain, moderate pain, fever or headache.    Yes [provider]  albuterol (PROVENTIL HFA;VENTOLIN HFA) 108 (90 BASE)  MCG/ACT inhaler Inhale 2 puffs into the lungs every 6 (six) hours as needed for wheezing or shortness of breath.   Yes [provider]  amLODipine (NORVASC) 10 MG tablet Take 10 mg by mouth daily.   Yes [provider]  atorvastatin (LIPITOR) 80 MG tablet Take 80 mg by mouth daily. 12/01/15  Yes [provider]  budesonide-formoterol (SYMBICORT) 80-4.5 MCG/ACT inhaler Inhale 2 puffs into the lungs 2 (two) times daily.   Yes [provider]  chlorthalidone (HYGROTON) 25 MG tablet TAKE 1 TABLET(25 MG) BY MOUTH DAILY 07/06/17  Yes Skeet Latch, MD  D3-50 50000 units capsule Take 50,000 Units by mouth once a week. 02/18/18  Yes [provider]  escitalopram (LEXAPRO) 10 MG tablet Take 10 mg by mouth daily. 03/15/18  Yes [provider]  fluticasone (FLONASE) 50 MCG/ACT nasal spray Place 1 spray into both nostrils daily as needed (for nasal congestion/sinus infection).  10/18/15  Yes [provider]  Insulin Aspart (NOVOLOG FLEXPEN [place]) Inject 70 Units into the skin 3 (three) times daily. With meals   Yes [provider]  insulin regular human CONCENTRATED (HUMULIN R U-500 KWIKPEN) 500 UNIT/ML kwikpen Inject 120 Units 3 (three) times daily with meals into the skin.  02/16/17  Yes [provider]  lisinopril (PRINIVIL,ZESTRIL) 40 MG tablet TAKE 1  TABLET(40 MG) BY MOUTH DAILY Patient taking differently: TAKE 1 TABLET(40 MG) BY MOUTH DAILY AT BEDTIME 07/06/17  Yes Skeet Latch, MD  loratadine (CLARITIN) 10 MG tablet Take 10 mg by mouth at bedtime.  08/04/13  Yes [provider]  montelukast (SINGULAIR) 10 MG tablet Take 10 mg by mouth at bedtime. Reported on 02/16/2016 04/13/15  Yes [provider]  ondansetron (ZOFRAN ODT) 4 MG disintegrating tablet 4mg  ODT q6 hours prn nausea/vomit Patient taking differently: Take 4 mg by mouth every 6 (six) hours as needed for nausea or vomiting.  12/09/15  Yes Etta Quill, NP  rivaroxaban (XARELTO) 20 MG TABS tablet Take 20 mg by mouth at bedtime.    Yes [provider]  rizatriptan (MAXALT-MLT) 5 MG disintegrating tablet Take 1 tablet (5 mg total) by mouth as needed for migraine. May repeat in 2 hours if needed 05/07/14  Yes Marcial Pacas, MD  topiramate (TOPAMAX) 25 MG tablet Take 25 mg by mouth daily.   Yes [provider]  levETIRAcetam (KEPPRA) 500 MG tablet Take 500 mg by mouth 2 (two) times daily. 01/14/15 10/20/17  [provider]  ondansetron (ZOFRAN) 4 MG tablet Take 1 tablet (4 mg total) every 6 (six) hours by mouth. Patient not taking: Reported on 05/28/2018 10/20/17   Renita Papa, PA-C    Family History Family History  Problem Relation Age of Onset  . Melanoma Father 10  . Diabetes Father   . Hypertension Father   . Kidney disease Father   . Ulcerative colitis Mother 34  . Diabetes Mother   . Hypertension Mother   . Breast cancer Paternal Grandmother   . Cancer Paternal Grandmother   . Diabetes type II Maternal Grandmother        deceased 45  . Diabetes Maternal Grandmother   . Pulmonary embolism Paternal Grandfather   . Stroke Maternal Grandfather     Social History Social History   Tobacco Use  . Smoking status: Never Smoker  . Smokeless tobacco: Never Used  Substance Use Topics  . Alcohol use: No  . Drug use: No      Allergies  Silver; Codeine; Darvocet [propoxyphene n-acetaminophen]; Hydrocodone-acetaminophen; Hydrocodone-acetaminophen; Latex; Tape; and Tramadol   Review of Systems Review of Systems  Respiratory: Negative for shortness of breath.   Cardiovascular: Positive for leg swelling.  Musculoskeletal: Negative for stiffness.  Skin: Negative for itching.  Neurological: Negative for tingling and numbness.  All other systems reviewed and are negative.    Physical Exam Updated Vital Signs BP 119/88   Pulse 87   Temp 98.7 F (37.1 C) (Oral)   Resp 19   Ht 5' (1.524 m)   Wt 69.4 kg (153 lb)   LMP 11/06/2012   SpO2 99%   BMI 29.88 kg/m   Physical Exam  Constitutional: She is oriented to person, place, and time. She appears well-developed and well-nourished. No distress.  HENT:  Head: Normocephalic and atraumatic.  Mouth/Throat: Oropharynx is clear and moist.  Eyes: Pupils are equal, round, and reactive to light. Conjunctivae and EOM are normal.  Neck: Normal range of motion. Neck supple.  Cardiovascular: Normal rate, regular rhythm and normal heart sounds.  Pulmonary/Chest: Effort normal and breath sounds normal. No respiratory distress.  Abdominal: Soft. She exhibits no distension. There is no tenderness.  Musculoskeletal: Normal range of motion. She exhibits no edema or deformity.       Left lower leg: She exhibits no tenderness and no edema.  No appreciable lower extremity edema  Neurological: She is alert and oriented to person, place, and time.  Skin: Skin is warm and dry.  Psychiatric: She has a normal mood and affect.  Nursing note and vitals reviewed.    ED Treatments / Results  Labs (all labs ordered are listed, but only abnormal results are displayed) Labs Reviewed  CBC WITH DIFFERENTIAL/PLATELET - Abnormal; Notable for the following components:      Result Value   Hemoglobin 11.8 (*)    Platelets 430 (*)    Lymphs Abs 4.6 (*)    All other components  within normal limits  BASIC METABOLIC PANEL - Abnormal; Notable for the following components:   Chloride 97 (*)    Glucose, Bld 534 (*)    All other components within normal limits  CBG MONITORING, ED - Abnormal; Notable for the following components:   Glucose-Capillary 444 (*)    All other components within normal limits  CBG MONITORING, ED - Abnormal; Notable for the following components:   Glucose-Capillary 314 (*)    All other components within normal limits  D-DIMER, QUANTITATIVE (NOT AT [facility])    EKG EKG Interpretation  Date/Time:  Tuesday May 28 2018 17:11:34 EDT Ventricular Rate:  92 PR Interval:    QRS Duration: 84 QT Interval:  346 QTC Calculation: 428 R Axis:   58 Text Interpretation:  Sinus rhythm Low voltage, precordial leads Borderline T abnormalities, diffuse leads Confirmed by Dene Gentry [phone]) on 05/28/2018 5:22:11 PM   Radiology No results found.  Procedures Procedures (including critical care time)  Medications Ordered in ED Medications  sodium chloride 0.9 % bolus 500 mL (500 mLs Intravenous New Bag/Given 05/28/18 2022)  insulin aspart (novoLOG) injection 6 Units (6 Units Intravenous Given 05/28/18 2022)     Initial Impression / Assessment and Plan / ED Course  I have reviewed the triage vital signs and the nursing notes.  Pertinent labs & imaging results that were available during my care of the patient were reviewed by me and considered in my medical decision making (see chart for details).     MDM  Screen complete  Patient is presenting  for evaluation of possible DVT/PE.  Patient without overt evidence of same on exam.  However patient does have a prior history of DVT/PE.  Screening labs obtained are reassuring.  D-dimer is negative. [place] study of both lower extremities is negative for DVT.  Of note, patient's blood sugar is elevated.  She has no evidence of DKA.  Her gap is closed.  She admits with further questioning that she has not  been fully compliant with her insulin at home for the last several days.  After administration of fluids and insulin in the ED her glucose is decreased.  She desires discharge home.  Close follow-up is stressed.  Strict return precautions are given and understood.    Final Clinical Impressions(s) / ED Diagnoses   Final diagnoses:  Hyperglycemia    ED Discharge Orders    None       Valarie Merino, MD 05/28/18 2107

## 2018-05-28 NOTE — Discharge Instructions (Addendum)
These return for any problem.  Follow-up with your regular doctor tomorrow as instructed.  Please take your insulin as previously prescribed.

## 2018-05-28 NOTE — Progress Notes (Signed)
LE venous duplex prelim: negative for DVT. Torina Ey Eunice, RDMS, RVT  

## 2018-05-28 NOTE — ED Triage Notes (Signed)
Patient c/o right arm numbness and bilateral leg pain x one week. Patient states she flew to [facility] and drove back from [place]. Patient states she has had "swelling to both legs then they go down." patient has no arm drift. No other c/o numbness, speech clear.

## 2018-06-27 DIAGNOSIS — E1169 Type 2 diabetes mellitus with other specified complication: Secondary | ICD-10-CM | POA: Insufficient documentation

## 2018-06-27 DIAGNOSIS — E785 Hyperlipidemia, unspecified: Secondary | ICD-10-CM | POA: Insufficient documentation

## 2018-10-21 ENCOUNTER — Ambulatory Visit (INDEPENDENT_AMBULATORY_CARE_PROVIDER_SITE_OTHER): Payer: Medicare Other | Admitting: Internal Medicine

## 2018-10-21 ENCOUNTER — Other Ambulatory Visit: Payer: Self-pay | Admitting: Internal Medicine

## 2018-10-21 ENCOUNTER — Encounter: Payer: Self-pay | Admitting: Internal Medicine

## 2018-10-21 VITALS — BP 164/97 | HR 91 | Temp 97.6°F | Wt 159.0 lb

## 2018-10-21 DIAGNOSIS — E1142 Type 2 diabetes mellitus with diabetic polyneuropathy: Secondary | ICD-10-CM

## 2018-10-21 DIAGNOSIS — Z86711 Personal history of pulmonary embolism: Secondary | ICD-10-CM

## 2018-10-21 DIAGNOSIS — Z79899 Other long term (current) drug therapy: Secondary | ICD-10-CM

## 2018-10-21 DIAGNOSIS — I10 Essential (primary) hypertension: Secondary | ICD-10-CM | POA: Diagnosis not present

## 2018-10-21 DIAGNOSIS — Z86718 Personal history of other venous thrombosis and embolism: Secondary | ICD-10-CM

## 2018-10-21 DIAGNOSIS — J45909 Unspecified asthma, uncomplicated: Secondary | ICD-10-CM

## 2018-10-21 DIAGNOSIS — E1140 Type 2 diabetes mellitus with diabetic neuropathy, unspecified: Secondary | ICD-10-CM

## 2018-10-21 DIAGNOSIS — Z794 Long term (current) use of insulin: Secondary | ICD-10-CM

## 2018-10-21 DIAGNOSIS — G6289 Other specified polyneuropathies: Secondary | ICD-10-CM

## 2018-10-21 DIAGNOSIS — Z7951 Long term (current) use of inhaled steroids: Secondary | ICD-10-CM

## 2018-10-21 DIAGNOSIS — Z7901 Long term (current) use of anticoagulants: Secondary | ICD-10-CM

## 2018-10-21 DIAGNOSIS — I2782 Chronic pulmonary embolism: Secondary | ICD-10-CM

## 2018-10-21 MED ORDER — AMLODIPINE BESYLATE 5 MG PO TABS: 5.0000 mg | ORAL_TABLET | Freq: Every day | ORAL | 1 refills | Status: DC

## 2018-10-21 MED ORDER — CETIRIZINE HCL 10 MG PO TABS: 10.0000 mg | ORAL_TABLET | Freq: Every day | ORAL | 3 refills | Status: DC

## 2018-10-21 MED ORDER — PREGABALIN 75 MG PO CAPS: 75.0000 mg | ORAL_CAPSULE | Freq: Three times a day (TID) | ORAL | 1 refills | Status: DC

## 2018-10-21 MED ORDER — LORATADINE 10 MG PO TABS: 10.0000 mg | ORAL_TABLET | Freq: Every day | ORAL | 3 refills | Status: DC

## 2018-10-21 MED ORDER — PREGABALIN 75 MG PO CAPS: 75.0000 mg | ORAL_CAPSULE | Freq: Three times a day (TID) | ORAL | 2 refills | Status: DC

## 2018-10-21 NOTE — Progress Notes (Signed)
CC: establishing as new patient  HPI:   [name] is a 47 y.o. female with a medical history of insulin-dependent DMII with lower extremity neuropathy, HTN, PE/DVT on Xarelto and the other medical conditions as listed below who presents to the internal medicine clinic as a new patient. Please see problem based charting for the status of the patient's current and chronic medical conditions.   Past Medical History:  Diagnosis Date  . Anemia   . Arthritis    Left leg  . Asthma   . BV (bacterial vaginosis) 2012  . Complication of anesthesia    Seizures in PACU after surgery 3/15  . DM (diabetes mellitus) ([place])    diagnosed 2009  . DVT (deep venous thrombosis) ([place]) 2011   pt had IUD; also 05/2012  . Dysfunctional uterine bleeding    Seen by Dr. Leo Grosser, GYN; pending hysterectomy as of 07/2012  . H/O hypercoagulable state    2/2 contraception  . Headache(784.0)    migraines  . Herpes simplex without mention of complication 01/4234   HSV-2  . HLD (hyperlipidemia)   . Hypertension   . Infection    UTI  . Labial lesion 2013  . Major depression    Hx suicide attempt in 2009, [facility] admissions  . Mastodynia   . Neuromuscular disorder (HCC)    neuropathy  . Neuropathy   . Obesity   . Oligomenorrhea 2011  . PE (pulmonary embolism)    2009 - on oral contraception; multiple  . Post - coital bleeding 2012  . Seizures ([place]) 2015   last episode May 2015  . Sleep apnea   . SORE THROAT 08/10/2010   Qualifier: Diagnosis of  By: Vanessa Kick MD, Saralyn Pilar    . Status post placement of implantable loop recorder   . Stroke [facility])    TIA 2013  . Syncopal episodes    unknown etiology  . Thyroid disease    hypothyroidism (pt denies)  . Vaginal Pap smear, abnormal   . Venous insufficiency   . Vitamin D deficiency    unknown to pt.   Past Surgical History:  Procedure Laterality Date  . BILATERAL SALPINGECTOMY  12/18/2012   Procedure: BILATERAL SALPINGECTOMY;  Surgeon: Eldred Manges,  MD;  Location: [facility];  Service: Gynecology;  Laterality: Bilateral;  . BLADDER SUSPENSION N/A 02/13/2014   Procedure: TRANSVAGINAL TAPE (TVT) PROCEDURE;  Surgeon: Delice Lesch, MD;  Location: [facility];  Service: Gynecology;  Laterality: N/A;  . BREAST REDUCTION SURGERY     Dr. Eugene [facility] 2011  . CARDIAC ELECTROPHYSIOLOGY STUDY & DFT     Implantable Loop recorder; no arrhythmias associated with synocpal spells; loop explanted 07-2013  . CHOLECYSTECTOMY    . DILATATION & CURRETTAGE/HYSTEROSCOPY WITH RESECTOCOPE  2007 & 2013  . ENDOMETRIAL BIOPSY  2011  . IUD REMOVAL  12/18/2012   Procedure: INTRAUTERINE DEVICE (IUD) REMOVAL;  Surgeon: Eldred Manges, MD;  Location: [facility];  Service: Gynecology;  Laterality: N/A;  Removed during prep by E. Florene Glen PA  . LAPAROSCOPIC ASSISTED VAGINAL HYSTERECTOMY  12/18/2012   Procedure: LAPAROSCOPIC ASSISTED VAGINAL HYSTERECTOMY;  Surgeon: Eldred Manges, MD;  Location: [facility];  Service: Gynecology;  Laterality: N/A;  . LOOP RECORDER EXPLANT N/A 07/17/2013   Procedure: LOOP RECORDER EXPLANT;  Surgeon: Thompson Grayer, MD;  Location: [facility] CATH LAB;  Service: Cardiovascular;  Laterality: N/A;  . TRANSVAGINAL TAPE (TVT) REMOVAL N/A 04/28/2014   Procedure: Removal of suburethral mesh;  Surgeon: Harvie Bridge  Mancel Bale, MD;  Location: [facility];  Service: Gynecology;  Laterality: N/A;  . WISDOM TOOTH EXTRACTION     Family History - Mom and dad have HTN and DM. No family history of blood clots.  Social History - Lives in [place] with her husband. Never smoker. Drinks wine on holidays. No illicit drug use.   Review of Systems:   Pertinent positives mentioned in HPI. Remainder of all ROS negative.  Physical Exam: Vitals:   10/21/18 1012 10/21/18 1015  BP: (!) 173/102 (!) 164/97  Pulse: (!) 102 91  Temp: 97.6 F (36.4 C)   TempSrc: Oral   SpO2: 100%   Weight: 159 lb (72.1 kg)    Physical Exam  Constitutional: Well-developed, well-nourished, and in no distress.    Eyes: Pupils are equal, round, and reactive to light. EOM are normal.  Cardiovascular: Normal rate and regular rhythm. No murmurs, rubs, or gallops. Pulmonary/Chest: Effort normal. Clear to auscultation bilaterally. No wheezes, rales, or rhonchi. Abdominal: Bowel sounds present. Soft, non-distended, non-tender. Ext: No lower extremity swelling, edema, or pain.  Skin: Warm and dry. No rashes or wounds.   Assessment & Plan:   See Encounters Tab for problem based charting.  Patient seen with Dr. Dareen Piano

## 2018-10-21 NOTE — Assessment & Plan Note (Signed)
Patient remains on Xarelto for history of DVTs and PE. Her first blood clot was in 2009 and her most recent was in 2014. She is compliant with Xarelto.  Plan 1. Continue Xarelto

## 2018-10-21 NOTE — Assessment & Plan Note (Signed)
Patient hypertensive to 173/102 with repeat of 164/97 today. She reports that she checks her BP at home and it usually ranges from 909P-112T systolic. Her current antihypertensive regimen includes chlorthalidone 25mg  daily and lisinopril 40mg  daily. She has been on amlodipine in the past and thinks that she stopped year ago when she ran out of the prescription.   Plan 1. BMP today 2. Start amlodipine 5mg  daily 3. Return in 2 months for BP check

## 2018-10-21 NOTE — Assessment & Plan Note (Signed)
Pt currently follows with Dr. Buddy Duty of endocrinology. She is currently on Trulicity 0.75mg  daily. Her last A1c was 8.2 in October. She reports that she takes her sugars twice a day. They are usually in the 120s in the morning and the 140s before dinner. She is unsure if she will continue to see the endocrinologist.  Plan 1. Repeat A1c in 2 months if patient no longer following with endocrinology

## 2018-10-21 NOTE — Assessment & Plan Note (Signed)
HPI: Current regimen includes symbicort and rescue inhaler. She reports that for the past month she has noticed occasional episodes of chest tightness, coughing, and dyspnea. She does not use her albuterol during these episodes, rather she takes her albuterol once in the morning each day.   Assessment: Her symptoms are likely asthma attacks related to the change in weather to colder temperatures. I educated the patient about how to best use her rescue inhaler. I advised that rather than using it in the morning when she feels normal that she should use it when she has the symptoms.   Plan 1. Continue Symbicort 2. Albuterol inhaler PRN with asthma attacks

## 2018-10-21 NOTE — Patient Instructions (Addendum)
It was a pleasure meeting you today, Ms. Lapre!  1. For your blood pressure - We will get some lab work today to check your electrolytes and kidney function. I will call you if there are any abnormalities. - Start taking amlodipine 5mg  daily.   2. For your leg pain - Stop taking amitriptyline - Increase your Lyrica from 50mg  three times a day to 75mg  TID  3. For your asthma - Use your albuterol inhaler when you notice cough, shortness of breath, and chest tightness.   4. For your diabetes - Continue taking Trulicity - Your next A2N will be due in January   5. For your history of blood clots - Continue taking Xarelto  Please follow-up in about 2 months for diabetes and blood pressure management.  Feel free to call our clinic at [phone] if you have any questions.  Thanks, Dr. Annie Paras

## 2018-10-21 NOTE — Assessment & Plan Note (Signed)
Patient reports burning pain in her lower extremities which has been present for years. She has tried gabapentin in the past, but this was not helpful. She is currently on amitriptyline and lyrica. She states that the amitriptyline did not change her pain, but the Lyrica (which she began about one week ago) has provided some relief.  Plan 1. Discontinue amitriptyline 2. Increase Lyrica from 50mg  TID to 75mg  TID.

## 2018-10-22 LAB — BMP8+ANION GAP
Anion Gap: 18 mmol/L (ref 10.0–18.0)
BUN / CREAT RATIO: 13 (ref 9–23)
BUN: 10 mg/dL (ref 6–24)
CHLORIDE: 100 mmol/L (ref 96–106)
CO2: 22 mmol/L (ref 20–29)
Calcium: 9.3 mg/dL (ref 8.7–10.2)
Creatinine, Ser: 0.76 mg/dL (ref 0.57–1.00)
GFR, EST AFRICAN AMERICAN: 109 mL/min/{1.73_m2} (ref >59)
GFR, EST NON AFRICAN AMERICAN: 94 mL/min/{1.73_m2} (ref >59)
GLUCOSE: 135 mg/dL — AB (ref 65–99)
POTASSIUM: 3.9 mmol/L (ref 3.5–5.2)
SODIUM: 140 mmol/L (ref 134–144)

## 2018-10-22 NOTE — Addendum Note (Signed)
Addended by: Corinne Ports on: 10/22/2018 08:58 AM   Modules accepted: Level of Service

## 2018-10-24 NOTE — Progress Notes (Signed)
Internal Medicine Clinic Attending  I saw and evaluated the patient.  I personally confirmed the key portions of the history and exam documented by Dr. Dorrell and I reviewed pertinent patient test results.  The assessment, diagnosis, and plan were formulated together and I agree with the documentation in the resident's note. 

## 2018-11-05 ENCOUNTER — Telehealth: Payer: Self-pay

## 2018-11-05 ENCOUNTER — Other Ambulatory Visit: Payer: Self-pay | Admitting: Internal Medicine

## 2018-11-05 MED ORDER — PREGABALIN 100 MG PO CAPS: 100.0000 mg | ORAL_CAPSULE | Freq: Three times a day (TID) | ORAL | 1 refills | Status: DC

## 2018-11-05 NOTE — Telephone Encounter (Signed)
Pt stated Lyrica 75 mg is not controlling the pain. She has been taking it TID  Since 11/11 visit. Continues to have shooting pain in her shoulders/arm and feet. Stated she had very bad episode last night with the pain. Thanks

## 2018-11-05 NOTE — Telephone Encounter (Signed)
Pt needs to speak with a nurse about pregabalin (LYRICA) 75 MG capsule. Please call back.

## 2018-11-05 NOTE — Telephone Encounter (Signed)
Spoke with the patient. She prefers to go up on the Lyrica rather than going back to amitriptyline or adding another medication. Will increase to Lyrica 100mg  TID. New prescription sent in. Patient expressed understanding.

## 2018-11-14 ENCOUNTER — Ambulatory Visit: Payer: Medicare Other | Admitting: Family Medicine

## 2018-12-24 ENCOUNTER — Encounter: Payer: Self-pay | Admitting: Internal Medicine

## 2018-12-25 ENCOUNTER — Ambulatory Visit (INDEPENDENT_AMBULATORY_CARE_PROVIDER_SITE_OTHER): Payer: Medicare Other | Admitting: Internal Medicine

## 2018-12-25 VITALS — BP 168/108 | HR 91 | Temp 98.0°F | Wt 161.1 lb

## 2018-12-25 DIAGNOSIS — E1140 Type 2 diabetes mellitus with diabetic neuropathy, unspecified: Secondary | ICD-10-CM

## 2018-12-25 DIAGNOSIS — I10 Essential (primary) hypertension: Secondary | ICD-10-CM | POA: Diagnosis not present

## 2018-12-25 DIAGNOSIS — Z86711 Personal history of pulmonary embolism: Secondary | ICD-10-CM | POA: Diagnosis not present

## 2018-12-25 DIAGNOSIS — Z7901 Long term (current) use of anticoagulants: Secondary | ICD-10-CM

## 2018-12-25 DIAGNOSIS — Z79899 Other long term (current) drug therapy: Secondary | ICD-10-CM

## 2018-12-25 DIAGNOSIS — J45909 Unspecified asthma, uncomplicated: Secondary | ICD-10-CM

## 2018-12-25 MED ORDER — AMLODIPINE BESYLATE 5 MG PO TABS: 10.0000 mg | ORAL_TABLET | Freq: Every day | ORAL | 3 refills | Status: DC

## 2018-12-25 MED ORDER — CHLORTHALIDONE 25 MG PO TABS: ORAL_TABLET | ORAL | 3 refills | Status: DC

## 2018-12-25 NOTE — Patient Instructions (Signed)
Thank you for allowing Makayla Frazier to provide your care today. Today we discussed your elevated blood pressure.  I have ordered bmp and renin-aldosterone labs for you. I will call if any are abnormal.    Today we made increased your amlodipine to 10mg  and chlorthalidone to 50mg  for your blood pressure.    Please follow-up in 1 week.    Should you have any questions or concerns please call the internal medicine clinic at [phone].     DASH Eating Plan DASH stands for "Dietary Approaches to Stop Hypertension." The DASH eating plan is a healthy eating plan that has been shown to reduce high blood pressure (hypertension). It may also reduce your risk for type 2 diabetes, heart disease, and stroke. The DASH eating plan may also help with weight loss. What are tips for following this plan?  General guidelines  Avoid eating more than 2,300 mg (milligrams) of salt (sodium) a day. If you have hypertension, you may need to reduce your sodium intake to 1,500 mg a day.  Limit alcohol intake to no more than 1 drink a day for nonpregnant women and 2 drinks a day for men. One drink equals 12 oz of beer, 5 oz of wine, or 1 oz of hard liquor.  Work with your health care provider to maintain a healthy body weight or to lose weight. Ask what an ideal weight is for you.  Get at least 30 minutes of exercise that causes your heart to beat faster (aerobic exercise) most days of the week. Activities may include walking, swimming, or biking.  Work with your health care provider or diet and nutrition specialist (dietitian) to adjust your eating plan to your individual calorie needs. Reading food labels   Check food labels for the amount of sodium per serving. Choose foods with less than 5 percent of the Daily Value of sodium. Generally, foods with less than 300 mg of sodium per serving fit into this eating plan.  To find whole grains, look for the word "whole" as the first word in the ingredient  list. Shopping  Buy products labeled as "low-sodium" or "no salt added."  Buy fresh foods. Avoid canned foods and premade or frozen meals. Cooking  Avoid adding salt when cooking. Use salt-free seasonings or herbs instead of table salt or sea salt. Check with your health care provider or pharmacist before using salt substitutes.  Do not fry foods. Cook foods using healthy methods such as baking, boiling, grilling, and broiling instead.  Cook with heart-healthy oils, such as olive, canola, soybean, or sunflower oil. Meal planning  Eat a balanced diet that includes: ? 5 or more servings of fruits and vegetables each day. At each meal, try to fill half of your plate with fruits and vegetables. ? Up to 6-8 servings of whole grains each day. ? Less than 6 oz of lean meat, poultry, or fish each day. A 3-oz serving of meat is about the same size as a deck of cards. One egg equals 1 oz. ? 2 servings of low-fat dairy each day. ? A serving of nuts, seeds, or beans 5 times each week. ? Heart-healthy fats. Healthy fats called Omega-3 fatty acids are found in foods such as flaxseeds and coldwater fish, like sardines, salmon, and mackerel.  Limit how much you eat of the following: ? Canned or prepackaged foods. ? Food that is high in trans fat, such as fried foods. ? Food that is high in saturated fat, such as fatty meat. ?  Sweets, desserts, sugary drinks, and other foods with added sugar. ? Full-fat dairy products.  Do not salt foods before eating.  Try to eat at least 2 vegetarian meals each week.  Eat more home-cooked food and less restaurant, buffet, and fast food.  When eating at a restaurant, ask that your food be prepared with less salt or no salt, if possible. What foods are recommended? The items listed may not be a complete list. Talk with your dietitian about what dietary choices are best for you. Grains Whole-grain or whole-wheat bread. Whole-grain or whole-wheat pasta. Brown  rice. [place]. Bulgur. Whole-grain and low-sodium cereals. Pita bread. Low-fat, low-sodium crackers. Whole-wheat flour tortillas. Vegetables Fresh or frozen vegetables (raw, steamed, roasted, or grilled). Low-sodium or reduced-sodium tomato and vegetable juice. Low-sodium or reduced-sodium tomato sauce and tomato paste. Low-sodium or reduced-sodium canned vegetables. Fruits All fresh, dried, or frozen fruit. Canned fruit in natural juice (without added sugar). Meat and other protein foods Skinless chicken or [place]. Ground chicken or [place]. Pork with fat trimmed off. Fish and seafood. Egg whites. Dried beans, peas, or lentils. Unsalted nuts, nut butters, and seeds. Unsalted canned beans. Lean cuts of beef with fat trimmed off. Low-sodium, lean deli meat. Dairy Low-fat (1%) or fat-free (skim) milk. Fat-free, low-fat, or reduced-fat cheeses. Nonfat, low-sodium ricotta or cottage cheese. Low-fat or nonfat yogurt. Low-fat, low-sodium cheese. Fats and oils Soft margarine without trans fats. Vegetable oil. Low-fat, reduced-fat, or light mayonnaise and salad dressings (reduced-sodium). Canola, safflower, olive, soybean, and sunflower oils. Avocado. Seasoning and other foods Herbs. Spices. Seasoning mixes without salt. Unsalted popcorn and pretzels. Fat-free sweets. What foods are not recommended? The items listed may not be a complete list. Talk with your dietitian about what dietary choices are best for you. Grains Baked goods made with fat, such as croissants, muffins, or some breads. Dry pasta or rice meal packs. Vegetables Creamed or fried vegetables. Vegetables in a cheese sauce. Regular canned vegetables (not low-sodium or reduced-sodium). Regular canned tomato sauce and paste (not low-sodium or reduced-sodium). Regular tomato and vegetable juice (not low-sodium or reduced-sodium). Angie Fava. Olives. Fruits Canned fruit in a light or heavy syrup. Fried fruit. Fruit in cream or butter  sauce. Meat and other protein foods Fatty cuts of meat. Ribs. Fried meat. Berniece Salines. Sausage. Bologna and other processed lunch meats. Salami. Fatback. Hotdogs. Bratwurst. Salted nuts and seeds. Canned beans with added salt. Canned or smoked fish. Whole eggs or egg yolks. Chicken or [place] with skin. Dairy Whole or 2% milk, cream, and half-and-half. Whole or full-fat cream cheese. Whole-fat or sweetened yogurt. Full-fat cheese. Nondairy creamers. Whipped toppings. Processed cheese and cheese spreads. Fats and oils Butter. Stick margarine. Lard. Shortening. Ghee. Bacon fat. Tropical oils, such as coconut, palm kernel, or palm oil. Seasoning and other foods Salted popcorn and pretzels. Onion salt, garlic salt, seasoned salt, table salt, and sea salt. Worcestershire sauce. Tartar sauce. Barbecue sauce. Teriyaki sauce. Soy sauce, including reduced-sodium. Steak sauce. Canned and packaged gravies. Fish sauce. Oyster sauce. Cocktail sauce. Horseradish that you find on the shelf. Ketchup. Mustard. Meat flavorings and tenderizers. Bouillon cubes. Hot sauce and Tabasco sauce. Premade or packaged marinades. Premade or packaged taco seasonings. Relishes. Regular salad dressings. Where to find more information:  National Heart, Lung, and [place]: https://wilson-eaton.com/  American Heart Association: www.heart.org Summary  The DASH eating plan is a healthy eating plan that has been shown to reduce high blood pressure (hypertension). It may also reduce your risk for type 2 diabetes,  heart disease, and stroke.  With the DASH eating plan, you should limit salt (sodium) intake to 2,300 mg a day. If you have hypertension, you may need to reduce your sodium intake to 1,500 mg a day.  When on the DASH eating plan, aim to eat more fresh fruits and vegetables, whole grains, lean proteins, low-fat dairy, and heart-healthy fats.  Work with your health care provider or diet and nutrition specialist (dietitian) to adjust  your eating plan to your individual calorie needs. This information is not intended to replace advice given to you by your health care provider. Make sure you discuss any questions you have with your health care provider. Document Released: 11/16/2011 Document Revised: 11/20/2016 Document Reviewed: 11/20/2016 Elsevier Interactive Patient Education  2019 [employer].

## 2018-12-25 NOTE — Progress Notes (Signed)
CC: Hypertension  HPI: [name] is a 48 y.o. F w/ PMH of asthma, T2DM, HTN, and PE on Xarelto presenting to clinic for management of her hypertension. She presented with elevated blood pressure on her last visit with her PCP and she was started on amlodipine in addition to her chlorthalidone and lisinopril. She mentions that she has been taking all 3 medications as prescribed and denies any significant number of episodes of missed doses. She states she is continuing to have significant neuropathic pain and believes the pain is causing her bp to be elevated. Currently she is taking Lyrica 100mg  TID. She denies any chest pain, palpitations, headache, blurry vision, nausea, vomiting, diarrhea.  Past Medical History:  Diagnosis Date  . Anemia   . Arthritis    Left leg  . Asthma   . BV (bacterial vaginosis) 2012  . Complication of anesthesia    Seizures in PACU after surgery 3/15  . DM (diabetes mellitus) ([place])    diagnosed 2009  . DVT (deep venous thrombosis) ([place]) 2011   pt had IUD; also 05/2012  . Dysfunctional uterine bleeding    Seen by Dr. Leo Frazier, GYN; pending hysterectomy as of 07/2012  . H/O hypercoagulable state    2/2 contraception  . Headache(784.0)    migraines  . Herpes simplex without mention of complication 02/3824   HSV-2  . HLD (hyperlipidemia)   . Hypertension   . Infection    UTI  . Labial lesion 2013  . Major depression    Hx suicide attempt in 2009, [facility] admissions  . Mastodynia   . Neuromuscular disorder (HCC)    neuropathy  . Neuropathy   . Obesity   . Oligomenorrhea 2011  . PE (pulmonary embolism)    2009 - on oral contraception; multiple  . Post - coital bleeding 2012  . Seizures ([place]) 2015   last episode May 2015  . Sleep apnea   . SORE THROAT 08/10/2010   Qualifier: Diagnosis of  By: Makayla Kick MD, Makayla Frazier    . Status post placement of implantable loop recorder   . Stroke [facility])    TIA 2013  . Syncopal episodes    unknown etiology  .  Thyroid disease    hypothyroidism (pt denies)  . Vaginal Pap smear, abnormal   . Venous insufficiency   . Vitamin D deficiency    unknown to pt.   Review of Systems: Review of Systems  Constitutional: Negative for chills, fever and malaise/fatigue.  HENT: Negative for ear pain.   Eyes: Negative for blurred vision and double vision.  Respiratory: Negative for cough and shortness of breath.   Cardiovascular: Negative for chest pain, palpitations and leg swelling.  Gastrointestinal: Negative for constipation, diarrhea, nausea and vomiting.  Musculoskeletal: Positive for joint pain and myalgias. Negative for back pain and neck pain.  Neurological: Positive for tingling. Negative for dizziness, sensory change, weakness and headaches.     Physical Exam: Vitals:   12/25/18 1449 12/25/18 1515 12/25/18 1516  BP: (!) 193/107 (!) 170/112 (!) 168/108  Pulse: 91    Temp: 98 F (36.7 C)    TempSrc: Oral    SpO2: 100%    Weight: 161 lb 1.6 oz (73.1 kg)      Physical Exam  Constitutional: She is oriented to person, place, and time. She appears well-developed and well-nourished. No distress.  HENT:  Mouth/Throat: Oropharynx is clear and moist. No oropharyngeal exudate.  Eyes: Conjunctivae are normal. No scleral icterus.  Neck:  Normal range of motion. Neck supple. No JVD present.  Cardiovascular: Normal rate, regular rhythm, normal heart sounds and intact distal pulses.  No murmur heard. Respiratory: Effort normal and breath sounds normal. She has no wheezes. She has no rales.  GI: Soft. Bowel sounds are normal. There is no abdominal tenderness.  Musculoskeletal: Normal range of motion.        General: No edema.  Neurological: She is Frazier and oriented to person, place, and time. No cranial nerve deficit.  Skin: Skin is warm and dry.      Assessment & Plan:   Essential hypertension BP Readings from Last 3 Encounters:  12/25/18 (!) 168/108  10/21/18 (!) 164/97  05/28/18 (!) 114/91     Presents for management of her blood pressure. Continues to be elevated despite 3 different classes of antihypertensives. She states her bp is up due to neuropathic pain which is being treated with [place]. Denies any headache, blurry vision, chest pain, palpitations. Neuro exam normal. Possibly resistant hypertension due to hyperaldosteronism but also is currently not on maximal doses of antihypertensive treatment. Discussed importance of following DASH diet.  - Renin-aldosterone & BMP today - Increase amlodipine to 10mg  daily - Increase chlorthalidone to 50mg  daily    Patient discussed with Makayla Frazier   -Makayla Frazier, PGY1

## 2018-12-27 ENCOUNTER — Encounter: Payer: Self-pay | Admitting: Internal Medicine

## 2018-12-27 NOTE — Assessment & Plan Note (Addendum)
BP Readings from Last 3 Encounters:  12/25/18 (!) 168/108  10/21/18 (!) 164/97  05/28/18 (!) 114/91   Presents for management of her blood pressure. Continues to be elevated despite 3 different classes of antihypertensives. She states her bp is up due to neuropathic pain which is being treated with [place]. Denies any headache, blurry vision, chest pain, palpitations. Neuro exam normal. Possibly resistant hypertension due to hyperaldosteronism but also is currently not on maximal doses of antihypertensive treatment. Discussed importance of following DASH diet.  - Renin-aldosterone & BMP today - Increase amlodipine to 10mg  daily - Increase chlorthalidone to 50mg  daily  Addendum: Renin-aldosterone ration <80. Primary hyperaldosteronism ruled out and no further work-up indicated.

## 2018-12-27 NOTE — Progress Notes (Signed)
Internal Medicine Clinic Attending  Case discussed with Dr. Truman Hayward at the time of the visit.  We reviewed the resident's history and exam and pertinent patient test results.  I agree with the assessment, diagnosis, and plan of care documented in the resident's note.  Persistent hypertension, will increase doses of her medicines. If she still does not respond, would classify as resistant. As we are already obtaining labs today, will go ahead and check renin/aldosterone. If at next visit BP still elevated despite increased doses (and confirmation she is actually filling the prescriptions as expected), would also check renal US, consider adding spironolactone.   Lenice Pressman, M.D., Ph.D.

## 2018-12-28 NOTE — Progress Notes (Deleted)
   CC: ***  HPI:   [name] is a 48 y.o.  Please see problem based charting for the history and status of the patient's current and chronic medical conditions.   Past Medical History:  Diagnosis Date  . Anemia   . Arthritis    Left leg  . Asthma   . BV (bacterial vaginosis) 2012  . Complication of anesthesia    Seizures in PACU after surgery 3/15  . DM (diabetes mellitus) ([place])    diagnosed 2009  . DVT (deep venous thrombosis) ([place]) 2011   pt had IUD; also 05/2012  . Dysfunctional uterine bleeding    Seen by Dr. Leo Grosser, GYN; pending hysterectomy as of 07/2012  . H/O hypercoagulable state    2/2 contraception  . Headache(784.0)    migraines  . Herpes simplex without mention of complication 06/349   HSV-2  . HLD (hyperlipidemia)   . Hypertension   . Infection    UTI  . Labial lesion 2013  . Major depression    Hx suicide attempt in 2009, [facility] admissions  . Mastodynia   . Neuromuscular disorder (HCC)    neuropathy  . Neuropathy   . Obesity   . Oligomenorrhea 2011  . PE (pulmonary embolism)    2009 - on oral contraception; multiple  . Post - coital bleeding 2012  . Seizures ([place]) 2015   last episode May 2015  . Sleep apnea   . SORE THROAT 08/10/2010   Qualifier: Diagnosis of  By: Vanessa Kick MD, Saralyn Pilar    . Status post placement of implantable loop recorder   . Stroke [facility])    TIA 2013  . Syncopal episodes    unknown etiology  . Thyroid disease    hypothyroidism (pt denies)  . Vaginal Pap smear, abnormal   . Venous insufficiency   . Vitamin D deficiency    unknown to pt.    Review of Systems:   Pertinent positives mentioned in HPI. Remainder of all ROS negative.  Physical Exam: There were no vitals filed for this visit. *** Physical Exam  Constitutional: Well-developed, well-nourished, and in no distress.  Eyes: Pupils are equal, round, and reactive to light. EOM are normal.  Cardiovascular: Normal rate and regular rhythm. No murmurs, rubs, or  gallops. Pulmonary/Chest: Effort normal. Clear to auscultation bilaterally. No wheezes, rales, or rhonchi. Abdominal: Bowel sounds present. Soft, non-distended, non-tender. Ext: No lower extremity edema. Skin: Warm and dry. No rashes or wounds.   Assessment & Plan:   See Encounters Tab for problem based charting.  Patient {GC/GE:3044014::"discussed with","seen with"} Dr. {NAMES:[id number]::"Butcher","Granfortuna","E. Hoffman","Klima","Mullen","Narendra","Raines","Vincent"}   HTN - seen 1/17 - pressures were elevated - increased amlodipine to 10mg  daily and chlorthalidone to 50mg  daily - also on lisinopril 40 - BMP normal except glucose of 175 - F/u renin/aldosterone  Peripheral neuropathy - Lyrica 100mg  TID - Didn't like amitriptyline   DMII - A1c was 8.2 in October

## 2018-12-30 LAB — BMP8+ANION GAP
Anion Gap: 17 mmol/L (ref 10.0–18.0)
BUN/Creatinine Ratio: 11 (ref 9–23)
BUN: 8 mg/dL (ref 6–24)
CO2: 23 mmol/L (ref 20–29)
Calcium: 9.5 mg/dL (ref 8.7–10.2)
Chloride: 99 mmol/L (ref 96–106)
Creatinine, Ser: 0.7 mg/dL (ref 0.57–1.00)
GFR calc Af Amer: 119 mL/min/{1.73_m2} (ref >59)
GFR calc non Af Amer: 104 mL/min/{1.73_m2} (ref >59)
Glucose: 175 mg/dL — ABNORMAL HIGH (ref 65–99)
Potassium: 4 mmol/L (ref 3.5–5.2)
Sodium: 139 mmol/L (ref 134–144)

## 2018-12-30 LAB — ALDOSTERONE + RENIN ACTIVITY W/ RATIO
ALDOS/RENIN RATIO: >75.4 — ABNORMAL HIGH (ref 0.0–30.0)
ALDOSTERONE: 12.6 ng/dL (ref 0.0–30.0)
Renin: <0.167 ng/mL/hr — ABNORMAL LOW (ref 0.167–5.380)

## 2018-12-31 ENCOUNTER — Telehealth: Payer: Self-pay | Admitting: Internal Medicine

## 2018-12-31 ENCOUNTER — Encounter: Payer: Medicare Other | Admitting: Internal Medicine

## 2018-12-31 NOTE — Telephone Encounter (Signed)
Called and left voicemail regarding lab results. Asked for call-back regarding blood pressure readings at home.

## 2019-02-05 ENCOUNTER — Other Ambulatory Visit: Payer: Self-pay | Admitting: Internal Medicine

## 2019-02-06 ENCOUNTER — Other Ambulatory Visit: Payer: Self-pay | Admitting: Internal Medicine

## 2019-02-26 DIAGNOSIS — Z1231 Encounter for screening mammogram for malignant neoplasm of breast: Secondary | ICD-10-CM | POA: Diagnosis not present

## 2019-03-07 DIAGNOSIS — T50905A Adverse effect of unspecified drugs, medicaments and biological substances, initial encounter: Secondary | ICD-10-CM | POA: Diagnosis not present

## 2019-03-07 DIAGNOSIS — R0602 Shortness of breath: Secondary | ICD-10-CM | POA: Diagnosis not present

## 2019-03-07 DIAGNOSIS — J45909 Unspecified asthma, uncomplicated: Secondary | ICD-10-CM | POA: Diagnosis not present

## 2019-04-08 DIAGNOSIS — E119 Type 2 diabetes mellitus without complications: Secondary | ICD-10-CM | POA: Diagnosis not present

## 2019-04-08 DIAGNOSIS — I10 Essential (primary) hypertension: Secondary | ICD-10-CM | POA: Diagnosis not present

## 2019-04-15 DIAGNOSIS — E119 Type 2 diabetes mellitus without complications: Secondary | ICD-10-CM | POA: Diagnosis not present

## 2019-04-15 DIAGNOSIS — I10 Essential (primary) hypertension: Secondary | ICD-10-CM | POA: Diagnosis not present

## 2019-04-15 DIAGNOSIS — J45909 Unspecified asthma, uncomplicated: Secondary | ICD-10-CM | POA: Diagnosis not present

## 2019-04-17 DIAGNOSIS — E1140 Type 2 diabetes mellitus with diabetic neuropathy, unspecified: Secondary | ICD-10-CM | POA: Diagnosis not present

## 2019-04-17 DIAGNOSIS — E118 Type 2 diabetes mellitus with unspecified complications: Secondary | ICD-10-CM | POA: Diagnosis not present

## 2019-04-17 DIAGNOSIS — Z8673 Personal history of transient ischemic attack (TIA), and cerebral infarction without residual deficits: Secondary | ICD-10-CM | POA: Diagnosis not present

## 2019-04-17 DIAGNOSIS — E1165 Type 2 diabetes mellitus with hyperglycemia: Secondary | ICD-10-CM | POA: Diagnosis not present

## 2019-04-30 DIAGNOSIS — Z8673 Personal history of transient ischemic attack (TIA), and cerebral infarction without residual deficits: Secondary | ICD-10-CM | POA: Diagnosis not present

## 2019-04-30 DIAGNOSIS — E118 Type 2 diabetes mellitus with unspecified complications: Secondary | ICD-10-CM | POA: Diagnosis not present

## 2019-04-30 DIAGNOSIS — E1165 Type 2 diabetes mellitus with hyperglycemia: Secondary | ICD-10-CM | POA: Diagnosis not present

## 2019-04-30 DIAGNOSIS — E1140 Type 2 diabetes mellitus with diabetic neuropathy, unspecified: Secondary | ICD-10-CM | POA: Diagnosis not present

## 2019-05-07 DIAGNOSIS — R51 Headache: Secondary | ICD-10-CM | POA: Diagnosis not present

## 2019-06-01 ENCOUNTER — Encounter: Payer: Self-pay | Admitting: *Deleted

## 2019-06-12 DIAGNOSIS — E118 Type 2 diabetes mellitus with unspecified complications: Secondary | ICD-10-CM | POA: Diagnosis not present

## 2019-06-12 DIAGNOSIS — E1140 Type 2 diabetes mellitus with diabetic neuropathy, unspecified: Secondary | ICD-10-CM | POA: Diagnosis not present

## 2019-06-12 DIAGNOSIS — E1165 Type 2 diabetes mellitus with hyperglycemia: Secondary | ICD-10-CM | POA: Diagnosis not present

## 2019-06-12 DIAGNOSIS — Z8673 Personal history of transient ischemic attack (TIA), and cerebral infarction without residual deficits: Secondary | ICD-10-CM | POA: Diagnosis not present

## 2019-07-08 ENCOUNTER — Encounter: Payer: Self-pay | Admitting: Internal Medicine

## 2019-07-08 ENCOUNTER — Encounter: Payer: Medicare Other | Admitting: Internal Medicine

## 2019-07-11 ENCOUNTER — Ambulatory Visit: Payer: Medicare Other

## 2019-07-11 ENCOUNTER — Telehealth: Payer: Self-pay | Admitting: Internal Medicine

## 2019-07-11 NOTE — Telephone Encounter (Signed)
Attempted to call patient twice for telehealth visit. No answer. Will try to reschedule appointment. If patient calls back, please schedule for a PCP visit.

## 2019-07-17 ENCOUNTER — Other Ambulatory Visit: Payer: Self-pay | Admitting: Internal Medicine

## 2019-07-17 DIAGNOSIS — I10 Essential (primary) hypertension: Secondary | ICD-10-CM

## 2019-09-05 ENCOUNTER — Ambulatory Visit (HOSPITAL_COMMUNITY)
Admission: EM | Admit: 2019-09-05 | Discharge: 2019-09-05 | Disposition: A | Discharge status: Home / Self Care | Payer: Medicare Other | Attending: Emergency Medicine | Admitting: Emergency Medicine

## 2019-09-05 ENCOUNTER — Other Ambulatory Visit: Payer: Self-pay

## 2019-09-05 ENCOUNTER — Telehealth (HOSPITAL_COMMUNITY): Payer: Self-pay | Admitting: Emergency Medicine

## 2019-09-05 ENCOUNTER — Encounter (HOSPITAL_COMMUNITY): Payer: Self-pay

## 2019-09-05 DIAGNOSIS — R109 Unspecified abdominal pain: Secondary | ICD-10-CM | POA: Insufficient documentation

## 2019-09-05 LAB — COMPREHENSIVE METABOLIC PANEL
ALT: 28 U/L (ref 0–44)
AST: 19 U/L (ref 15–41)
Albumin: 3.6 g/dL (ref 3.5–5.0)
Alkaline Phosphatase: 60 U/L (ref 38–126)
Anion gap: 10 (ref 5–15)
BUN: 10 mg/dL (ref 6–20)
CO2: 26 mmol/L (ref 22–32)
Calcium: 8.8 mg/dL — ABNORMAL LOW (ref 8.9–10.3)
Chloride: 100 mmol/L (ref 98–111)
Creatinine, Ser: 0.71 mg/dL (ref 0.44–1.00)
GFR calc Af Amer: >60 mL/min (ref >60)
GFR calc non Af Amer: >60 mL/min (ref >60)
Glucose, Bld: 238 mg/dL — ABNORMAL HIGH (ref 70–99)
Potassium: 4.1 mmol/L (ref 3.5–5.1)
Sodium: 136 mmol/L (ref 135–145)
Total Bilirubin: 0.2 mg/dL — ABNORMAL LOW (ref 0.3–1.2)
Total Protein: 7.2 g/dL (ref 6.5–8.1)

## 2019-09-05 LAB — POCT URINALYSIS DIP (DEVICE)
Bilirubin Urine: NEGATIVE
Glucose, UA: 250 mg/dL — AB
Hgb urine dipstick: NEGATIVE
Ketones, ur: NEGATIVE mg/dL
Leukocytes,Ua: NEGATIVE
Nitrite: NEGATIVE
Protein, ur: NEGATIVE mg/dL
Specific Gravity, Urine: <=1.005 (ref 1.005–1.030)
Urobilinogen, UA: 0.2 mg/dL (ref 0.0–1.0)
pH: 5.5 (ref 5.0–8.0)

## 2019-09-05 LAB — CBC
HCT: 40.3 % (ref 36.0–46.0)
Hemoglobin: 13.4 g/dL (ref 12.0–15.0)
MCH: 26.6 pg (ref 26.0–34.0)
MCHC: 33.3 g/dL (ref 30.0–36.0)
MCV: 80 fL (ref 80.0–100.0)
Platelets: 316 10*3/uL (ref 150–400)
RBC: 5.04 MIL/uL (ref 3.87–5.11)
RDW: 15.4 % (ref 11.5–15.5)
WBC: 9.8 10*3/uL (ref 4.0–10.5)
nRBC: 0 % (ref 0.0–0.2)

## 2019-09-05 LAB — POCT PREGNANCY, URINE: Preg Test, Ur: NEGATIVE

## 2019-09-05 NOTE — ED Provider Notes (Signed)
[place]    CSN: [id number] Arrival date & time: 09/05/19  1022      History   Chief Complaint Chief Complaint  Patient presents with  . Flank Pain    HPI Makayla Frazier is a 48 y.o. female.   Makayla Frazier presents with complaints of right sided abdominal and flank pain. She has felt it for at least about a month but was worse last night. Seems to be worse at night when laying flat. Feels better today than it did last night. No specific triggers. No urinary symptoms. Normal BM's, last was yesterday without straining. No fever or chills. No trigger of pain with movement. No nausea or vomiting. No rash. Has taken tylenol which hasn't helped. No blood in urine. She is on a blood thinner. She has had a hysterectomy, still has ovaries. Has had her gallbladder removed. Still with appendix. No history of kidney stones. Doesn't check her blood sugar at home. Has an appointment with a new PCP on 10/5. Her bp has been resistant to treatment, states she has been taking prescribed medications, she takes three BP medications. Right flank pain is listed in PMH, although I do not see any recent visit for this, patient denies any previous similar. See PMH.     ROS per HPI, negative if not otherwise mentioned.      Past Medical History:  Diagnosis Date  . Anemia   . Arthritis    Left leg  . Asthma   . BV (bacterial vaginosis) 2012  . Complication of anesthesia    Seizures in PACU after surgery 3/15  . DM (diabetes mellitus) ([place])    diagnosed 2009  . DVT (deep venous thrombosis) ([place]) 2011   pt had IUD; also 05/2012  . Dysfunctional uterine bleeding    Seen by Dr. Leo Grosser, GYN; pending hysterectomy as of 07/2012  . H/O hypercoagulable state    2/2 contraception  . Headache(784.0)    migraines  . Herpes simplex without mention of complication 0000000   HSV-2  . HLD (hyperlipidemia)   . Hypertension   . Infection    UTI  . Labial lesion 2013  . Major depression    Hx suicide attempt in 2009, [facility] admissions  . Mastodynia   . Neuromuscular disorder (HCC)    neuropathy  . Neuropathy   . Obesity   . Oligomenorrhea 2011  . PE (pulmonary embolism)    2009 - on oral contraception; multiple  . Post - coital bleeding 2012  . Seizures ([place]) 2015   last episode May 2015  . Sleep apnea   . SORE THROAT 08/10/2010   Qualifier: Diagnosis of  By: Vanessa Kick MD, Saralyn Pilar    . Status post placement of implantable loop recorder   . Stroke [facility])    TIA 2013  . Syncopal episodes    unknown etiology  . Thyroid disease    hypothyroidism (pt denies)  . Vaginal Pap smear, abnormal   . Venous insufficiency   . Vitamin D deficiency    unknown to pt.    Patient Active Problem List   Diagnosis Date Noted  . Reaction to severe stress 03/07/2016  . Bleeding per rectum 11/29/2015  . Acute pansinusitis 10/20/2015  . Candida vaginitis 10/20/2015  . Cerebrovascular accident, late effects 08/30/2015  . D (diarrhea) 08/02/2015  . Seizure disorder ([place]) 07/13/2015  . Adnexal pain 06/08/2015  . Right flank pain 05/30/2015  . Chronic pulmonary embolism ([place]) 05/14/2015  . Long  term current use of anticoagulant 05/14/2015  . Seizures ([place]) 05/07/2014  . Absence of bladder continence 03/27/2014  . Arthropathia 02/25/2014  . Chronic chest pain 02/25/2014  . Healed or old pulmonary embolism 02/25/2014  . HLD (hyperlipidemia) 02/25/2014  . Headache, migraine 02/25/2014  . Obstructive apnea 02/25/2014  . Type 2 diabetes mellitus ([place]) 02/25/2014  . Incontinence 02/13/2014  . S/P vaginal hysterectomy 01/29/2013  . TIA (transient ischemic attack) 08/03/2012  . Left-sided weakness 08/03/2012  . Hypokalemia 08/03/2012  . Chest pain 08/03/2012  . Temporary cerebral vascular dysfunction 08/03/2012  . History of pulmonary embolism 05/18/2012  . Patellar tendinitis of left knee 08/21/2011  . Knee pain, left 07/07/2011  . PRURITUS 07/04/2010  . Syncope and collapse 10/18/2009   . Allergic rhinitis 08/11/2009  . Unspecified vitamin D deficiency 07/21/2009  . Adiposity 07/19/2009  . Apnea, sleep 07/19/2009  . ANEMIA 07/10/2008  . Essential hypertension 07/08/2008  . Insulin dependent diabetes mellitus with complications ([place]) [id number]  . Peripheral neuropathy 01/22/2008  . Asthma 01/22/2008  . Mononeuritis 01/22/2008    Past Surgical History:  Procedure Laterality Date  . BILATERAL SALPINGECTOMY  12/18/2012   Procedure: BILATERAL SALPINGECTOMY;  Surgeon: Eldred Manges, MD;  Location: [facility];  Service: Gynecology;  Laterality: Bilateral;  . BLADDER SUSPENSION N/A 02/13/2014   Procedure: TRANSVAGINAL TAPE (TVT) PROCEDURE;  Surgeon: Delice Lesch, MD;  Location: [facility];  Service: Gynecology;  Laterality: N/A;  . BREAST REDUCTION SURGERY     Dr. Eugene Garnet [facility] 2011  . CARDIAC ELECTROPHYSIOLOGY STUDY & DFT     Implantable Loop recorder; no arrhythmias associated with synocpal spells; loop explanted 07-2013  . CHOLECYSTECTOMY    . DILATATION & CURRETTAGE/HYSTEROSCOPY WITH RESECTOCOPE  2007 & 2013  . ENDOMETRIAL BIOPSY  2011  . IUD REMOVAL  12/18/2012   Procedure: INTRAUTERINE DEVICE (IUD) REMOVAL;  Surgeon: Eldred Manges, MD;  Location: [facility];  Service: Gynecology;  Laterality: N/A;  Removed during prep by E. Florene Glen PA  . LAPAROSCOPIC ASSISTED VAGINAL HYSTERECTOMY  12/18/2012   Procedure: LAPAROSCOPIC ASSISTED VAGINAL HYSTERECTOMY;  Surgeon: Eldred Manges, MD;  Location: [facility];  Service: Gynecology;  Laterality: N/A;  . LOOP RECORDER EXPLANT N/A 07/17/2013   Procedure: LOOP RECORDER EXPLANT;  Surgeon: Thompson Grayer, MD;  Location: [facility] CATH LAB;  Service: Cardiovascular;  Laterality: N/A;  . TRANSVAGINAL TAPE (TVT) REMOVAL N/A 04/28/2014   Procedure: Removal of suburethral mesh;  Surgeon: Delice Lesch, MD;  Location: [facility];  Service: Gynecology;  Laterality: N/A;  . WISDOM TOOTH EXTRACTION      OB History    Gravida  0   Para  0   Term       Preterm      AB      Living        SAB      TAB      Ectopic      Multiple      Live Births               Home Medications    Prior to Admission medications   Medication Sig Start Date End Date Taking? Authorizing Provider  acetaminophen (TYLENOL) 500 MG tablet Take 500 mg by mouth every 6 (six) hours as needed.   Yes [provider]  albuterol (PROVENTIL HFA;VENTOLIN HFA) 108 (90 BASE) MCG/ACT inhaler Inhale 2 puffs into the lungs every 6 (six) hours as needed for wheezing or shortness of breath.  [provider]  amLODipine (NORVASC) 5 MG tablet Take 2 tablets (10 mg total) by mouth daily. 12/25/18   Mosetta Anis, MD  atorvastatin (LIPITOR) 80 MG tablet Take 80 mg by mouth daily. 12/01/15   [provider]  budesonide-formoterol (SYMBICORT) 80-4.5 MCG/ACT inhaler Inhale 2 puffs into the lungs 2 (two) times daily.    [provider]  cetirizine (ZYRTEC) 10 MG tablet Take 1 tablet (10 mg total) by mouth daily. 10/21/18   Dorrell, Andree Elk, MD  chlorthalidone (HYGROTON) 25 MG tablet TAKE 2 TABLETS(50 MG) BY MOUTH DAILY 07/21/19   Aslam, Loralyn Freshwater, MD  D3-50 50000 units capsule Take 50,000 Units by mouth once a week. 02/18/18   [provider]  escitalopram (LEXAPRO) 10 MG tablet Take 10 mg by mouth daily. 03/15/18   [provider]  fluticasone (FLONASE) 50 MCG/ACT nasal spray Place 1 spray into both nostrils daily as needed (for nasal congestion/sinus infection).  10/18/15   [provider]  insulin regular human CONCENTRATED (HUMULIN R U-500 KWIKPEN) 500 UNIT/ML kwikpen Inject 120 Units 3 (three) times daily with meals into the skin.  02/16/17   [provider]  levETIRAcetam (KEPPRA) 500 MG tablet Take 500 mg by mouth 2 (two) times daily. 01/14/15 10/20/17  [provider]  lisinopril (PRINIVIL,ZESTRIL) 40 MG tablet TAKE 1 TABLET(40 MG) BY MOUTH DAILY Patient taking differently: TAKE 1 TABLET(40 MG) BY  MOUTH DAILY AT BEDTIME 07/06/17   Skeet Latch, MD  montelukast (SINGULAIR) 10 MG tablet Take 10 mg by mouth at bedtime. Reported on 02/16/2016 04/13/15   [provider]  pregabalin (LYRICA) 100 MG capsule TAKE 1 CAPSULE(100 MG) BY MOUTH THREE TIMES DAILY 02/09/19   Dorrell, Andree Elk, MD  rivaroxaban (XARELTO) 20 MG TABS tablet Take 20 mg by mouth at bedtime.     [provider]  rizatriptan (MAXALT-MLT) 5 MG disintegrating tablet Take 1 tablet (5 mg total) by mouth as needed for migraine. May repeat in 2 hours if needed 05/07/14   Marcial Pacas, MD  topiramate (TOPAMAX) 25 MG tablet Take 25 mg by mouth daily.    [provider]  TRULICITY [id number] SOPN Inject 0.75 mg into the skin once a week. 10/15/18   [provider]    Family History Family History  Problem Relation Age of Onset  . Melanoma Father 19  . Diabetes Father   . Hypertension Father   . Kidney disease Father   . Ulcerative colitis Mother 26  . Diabetes Mother   . Hypertension Mother   . Breast cancer Paternal Grandmother   . Cancer Paternal Grandmother   . Diabetes type II Maternal Grandmother        deceased 38  . Diabetes Maternal Grandmother   . Pulmonary embolism Paternal Grandfather   . Stroke Maternal Grandfather     Social History Social History   Tobacco Use  . Smoking status: Never Smoker  . Smokeless tobacco: Never Used  Substance Use Topics  . Alcohol use: No  . Drug use: No     Allergies   Silver, Codeine, Darvocet [propoxyphene n-acetaminophen], Hydrocodone-acetaminophen, Hydrocodone-acetaminophen, Latex, Tape, and Tramadol   Review of Systems Review of Systems   Physical Exam Triage Vital Signs ED Triage Vitals  Enc Vitals Group     BP 09/05/19 1032 (!) 186/106     Pulse Rate 09/05/19 1032 84     Resp 09/05/19 1032 16     Temp 09/05/19 1032 (!) 97.3 F (  36.3 C)     Temp Source 09/05/19 1032 Temporal     SpO2 --      Weight --      Height --       Head Circumference --      Peak Flow --      Pain Score 09/05/19 1030 8     Pain Loc --      Pain Edu? --      Excl. in [place]? --    No data found.  Updated Vital Signs BP (!) 186/106 (BP Location: Right Arm)   Pulse 84   Temp (!) 97.3 F (36.3 C) (Temporal)   Resp 16   LMP 11/06/2012   Physical Exam Constitutional:      General: She is not in acute distress.    Appearance: She is well-developed.  Cardiovascular:     Rate and Rhythm: Normal rate.  Pulmonary:     Effort: Pulmonary effort is normal.  Abdominal:       Comments: Right mid abdomen and flank with tenderness, fairly superficially- to musculature of abdomen wall? No rash visualized; no rebound or guarding, negative CVA bilaterally; no RLQ tenderness; laying and sitting upright without any difficulty   Skin:    General: Skin is warm and dry.  Neurological:     Mental Status: She is alert and oriented to person, place, and time.      UC Treatments / Results  Labs (all labs ordered are listed, but only abnormal results are displayed) Labs Reviewed  POCT URINALYSIS DIP (DEVICE) - Abnormal; Notable for the following components:      Result Value   Glucose, UA 250 (*)    All other components within normal limits  CBC  COMPREHENSIVE METABOLIC PANEL    EKG   Radiology No results found.  Procedures Procedures (including critical care time)  Medications Ordered in UC Medications - No data to display  Initial Impression / Assessment and Plan / UC Course  I have reviewed the triage vital signs and the nursing notes.  Pertinent labs & imaging results that were available during my care of the patient were reviewed by me and considered in my medical decision making (see chart for details).     Afebrile. Non toxic in appearance. Right flank pain, no cva tenderness, no urinary symptoms. Urine WNl although glucose noted. CMP and CBC collected and pending. Will call patient if concerning. Musculoskeletal  considered. Return precautions provided. Tylenol, heat, stretching. Follow up with PCP as scheduled. Patient verbalized understanding and agreeable to plan. Ambulatory out of clinic without difficulty.    Final Clinical Impressions(s) / UC Diagnoses   Final diagnoses:  Right flank pain     Discharge Instructions     Your urine is not indicative of urinary or kidney stone source of symptoms.  I will do some basic blood work to see if there are any other concerning findings. I will call you if I am concerned.  Please follow up with your primary care provider for recheck if symptoms persist.  Monitor your blood sugar and diet to help with your blood pressure.  Tylenol as needed for pain. Heat and stretching may help as well.  Any worsening of symptoms- pain, fevers, urinary symptoms, or otherwise worsening- please go to the ER for further evaluation.    ED Prescriptions    None     PDMP not reviewed this encounter.   Zigmund Gottron, NP 09/05/19 1108

## 2019-09-05 NOTE — ED Triage Notes (Signed)
Patient report having right flank pain for 1 month.

## 2019-09-05 NOTE — Telephone Encounter (Signed)
Labs unremarkable. Attempted to reach patient. No answer at this time.

## 2019-09-05 NOTE — Discharge Instructions (Signed)
Your urine is not indicative of urinary or kidney stone source of symptoms.  I will do some basic blood work to see if there are any other concerning findings. I will call you if I am concerned.  Please follow up with your primary care provider for recheck if symptoms persist.  Monitor your blood sugar and diet to help with your blood pressure.  Tylenol as needed for pain. Heat and stretching may help as well.  Any worsening of symptoms- pain, fevers, urinary symptoms, or otherwise worsening- please go to the ER for further evaluation.

## 2019-09-05 NOTE — Telephone Encounter (Signed)
Patient contacted and made aware of  lab  results, all questions answered   

## 2019-09-15 DIAGNOSIS — Z0000 Encounter for general adult medical examination without abnormal findings: Secondary | ICD-10-CM | POA: Diagnosis not present

## 2019-09-15 DIAGNOSIS — J45909 Unspecified asthma, uncomplicated: Secondary | ICD-10-CM | POA: Diagnosis not present

## 2019-09-15 DIAGNOSIS — E119 Type 2 diabetes mellitus without complications: Secondary | ICD-10-CM | POA: Diagnosis not present

## 2019-09-15 DIAGNOSIS — D509 Iron deficiency anemia, unspecified: Secondary | ICD-10-CM | POA: Diagnosis not present

## 2019-09-15 DIAGNOSIS — Z23 Encounter for immunization: Secondary | ICD-10-CM | POA: Diagnosis not present

## 2019-11-08 ENCOUNTER — Inpatient Hospital Stay (HOSPITAL_COMMUNITY)
Admission: EM | Admit: 2019-11-08 | Discharge: 2019-11-13 | DRG: 312 | Disposition: A | Discharge status: Home Health | Payer: Medicare Other | Attending: Internal Medicine | Admitting: Internal Medicine

## 2019-11-08 ENCOUNTER — Encounter (HOSPITAL_COMMUNITY): Payer: Self-pay

## 2019-11-08 ENCOUNTER — Emergency Department (HOSPITAL_COMMUNITY): Payer: Medicare Other

## 2019-11-08 ENCOUNTER — Other Ambulatory Visit: Payer: Self-pay

## 2019-11-08 DIAGNOSIS — Z86718 Personal history of other venous thrombosis and embolism: Secondary | ICD-10-CM

## 2019-11-08 DIAGNOSIS — Z7951 Long term (current) use of inhaled steroids: Secondary | ICD-10-CM

## 2019-11-08 DIAGNOSIS — E1165 Type 2 diabetes mellitus with hyperglycemia: Secondary | ICD-10-CM | POA: Diagnosis present

## 2019-11-08 DIAGNOSIS — Z915 Personal history of self-harm: Secondary | ICD-10-CM

## 2019-11-08 DIAGNOSIS — G8384 Todd's paralysis (postepileptic): Secondary | ICD-10-CM | POA: Diagnosis present

## 2019-11-08 DIAGNOSIS — R55 Syncope and collapse: Secondary | ICD-10-CM | POA: Diagnosis not present

## 2019-11-08 DIAGNOSIS — Z7901 Long term (current) use of anticoagulants: Secondary | ICD-10-CM

## 2019-11-08 DIAGNOSIS — M5126 Other intervertebral disc displacement, lumbar region: Secondary | ICD-10-CM | POA: Diagnosis present

## 2019-11-08 DIAGNOSIS — Z20828 Contact with and (suspected) exposure to other viral communicable diseases: Secondary | ICD-10-CM | POA: Diagnosis present

## 2019-11-08 DIAGNOSIS — F329 Major depressive disorder, single episode, unspecified: Secondary | ICD-10-CM | POA: Diagnosis present

## 2019-11-08 DIAGNOSIS — R339 Retention of urine, unspecified: Secondary | ICD-10-CM | POA: Diagnosis present

## 2019-11-08 DIAGNOSIS — J984 Other disorders of lung: Secondary | ICD-10-CM

## 2019-11-08 DIAGNOSIS — I2782 Chronic pulmonary embolism: Secondary | ICD-10-CM | POA: Diagnosis present

## 2019-11-08 DIAGNOSIS — R29898 Other symptoms and signs involving the musculoskeletal system: Secondary | ICD-10-CM | POA: Diagnosis present

## 2019-11-08 DIAGNOSIS — Z79899 Other long term (current) drug therapy: Secondary | ICD-10-CM

## 2019-11-08 DIAGNOSIS — I160 Hypertensive urgency: Secondary | ICD-10-CM | POA: Diagnosis present

## 2019-11-08 DIAGNOSIS — E559 Vitamin D deficiency, unspecified: Secondary | ICD-10-CM | POA: Diagnosis present

## 2019-11-08 DIAGNOSIS — Z6831 Body mass index (BMI) 31.0-31.9, adult: Secondary | ICD-10-CM

## 2019-11-08 DIAGNOSIS — Z91040 Latex allergy status: Secondary | ICD-10-CM

## 2019-11-08 DIAGNOSIS — Z841 Family history of disorders of kidney and ureter: Secondary | ICD-10-CM

## 2019-11-08 DIAGNOSIS — I959 Hypotension, unspecified: Secondary | ICD-10-CM | POA: Diagnosis not present

## 2019-11-08 DIAGNOSIS — Z8673 Personal history of transient ischemic attack (TIA), and cerebral infarction without residual deficits: Secondary | ICD-10-CM

## 2019-11-08 DIAGNOSIS — B009 Herpesviral infection, unspecified: Secondary | ICD-10-CM | POA: Diagnosis present

## 2019-11-08 DIAGNOSIS — M549 Dorsalgia, unspecified: Secondary | ICD-10-CM

## 2019-11-08 DIAGNOSIS — G40909 Epilepsy, unspecified, not intractable, without status epilepticus: Secondary | ICD-10-CM | POA: Diagnosis present

## 2019-11-08 DIAGNOSIS — Z794 Long term (current) use of insulin: Secondary | ICD-10-CM

## 2019-11-08 DIAGNOSIS — Z833 Family history of diabetes mellitus: Secondary | ICD-10-CM

## 2019-11-08 DIAGNOSIS — I10 Essential (primary) hypertension: Secondary | ICD-10-CM | POA: Diagnosis present

## 2019-11-08 DIAGNOSIS — Z91048 Other nonmedicinal substance allergy status: Secondary | ICD-10-CM

## 2019-11-08 DIAGNOSIS — E669 Obesity, unspecified: Secondary | ICD-10-CM | POA: Diagnosis present

## 2019-11-08 DIAGNOSIS — E079 Disorder of thyroid, unspecified: Secondary | ICD-10-CM | POA: Diagnosis present

## 2019-11-08 DIAGNOSIS — E785 Hyperlipidemia, unspecified: Secondary | ICD-10-CM | POA: Diagnosis present

## 2019-11-08 DIAGNOSIS — J45909 Unspecified asthma, uncomplicated: Secondary | ICD-10-CM | POA: Diagnosis present

## 2019-11-08 DIAGNOSIS — G4730 Sleep apnea, unspecified: Secondary | ICD-10-CM | POA: Diagnosis present

## 2019-11-08 DIAGNOSIS — Z885 Allergy status to narcotic agent status: Secondary | ICD-10-CM

## 2019-11-08 DIAGNOSIS — R159 Full incontinence of feces: Secondary | ICD-10-CM | POA: Diagnosis present

## 2019-11-08 LAB — COMPREHENSIVE METABOLIC PANEL
ALT: 44 U/L (ref 0–44)
AST: 36 U/L (ref 15–41)
Albumin: 3.8 g/dL (ref 3.5–5.0)
Alkaline Phosphatase: 64 U/L (ref 38–126)
Anion gap: 12 (ref 5–15)
BUN: 9 mg/dL (ref 6–20)
CO2: 26 mmol/L (ref 22–32)
Calcium: 9.7 mg/dL (ref 8.9–10.3)
Chloride: 95 mmol/L — ABNORMAL LOW (ref 98–111)
Creatinine, Ser: 0.82 mg/dL (ref 0.44–1.00)
GFR calc Af Amer: >60 mL/min (ref >60)
GFR calc non Af Amer: >60 mL/min (ref >60)
Glucose, Bld: 390 mg/dL — ABNORMAL HIGH (ref 70–99)
Potassium: 4 mmol/L (ref 3.5–5.1)
Sodium: 133 mmol/L — ABNORMAL LOW (ref 135–145)
Total Bilirubin: 0.5 mg/dL (ref 0.3–1.2)
Total Protein: 8.2 g/dL — ABNORMAL HIGH (ref 6.5–8.1)

## 2019-11-08 LAB — CBC WITH DIFFERENTIAL/PLATELET
Abs Immature Granulocytes: 0.01 10*3/uL (ref 0.00–0.07)
Basophils Absolute: 0.1 10*3/uL (ref 0.0–0.1)
Basophils Relative: 1 %
Eosinophils Absolute: 0.1 10*3/uL (ref 0.0–0.5)
Eosinophils Relative: 1 %
HCT: 42.5 % (ref 36.0–46.0)
Hemoglobin: 14 g/dL (ref 12.0–15.0)
Immature Granulocytes: 0 %
Lymphocytes Relative: 48 %
Lymphs Abs: 4.8 10*3/uL — ABNORMAL HIGH (ref 0.7–4.0)
MCH: 26.1 pg (ref 26.0–34.0)
MCHC: 32.9 g/dL (ref 30.0–36.0)
MCV: 79.3 fL — ABNORMAL LOW (ref 80.0–100.0)
Monocytes Absolute: 0.6 10*3/uL (ref 0.1–1.0)
Monocytes Relative: 6 %
Neutro Abs: 4.4 10*3/uL (ref 1.7–7.7)
Neutrophils Relative %: 44 %
Platelets: 314 10*3/uL (ref 150–400)
RBC: 5.36 MIL/uL — ABNORMAL HIGH (ref 3.87–5.11)
RDW: 14.2 % (ref 11.5–15.5)
WBC: 10 10*3/uL (ref 4.0–10.5)
nRBC: 0 % (ref 0.0–0.2)

## 2019-11-08 LAB — ETHANOL: Alcohol, Ethyl (B): <10 mg/dL (ref <10)

## 2019-11-08 LAB — URINALYSIS, ROUTINE W REFLEX MICROSCOPIC
Bilirubin Urine: NEGATIVE
Glucose, UA: >=500 mg/dL — AB
Hgb urine dipstick: NEGATIVE
Ketones, ur: NEGATIVE mg/dL
Leukocytes,Ua: NEGATIVE
Nitrite: NEGATIVE
Protein, ur: NEGATIVE mg/dL
Specific Gravity, Urine: 1.024 (ref 1.005–1.030)
pH: 8 (ref 5.0–8.0)

## 2019-11-08 LAB — RAPID URINE DRUG SCREEN, HOSP PERFORMED
Amphetamines: NOT DETECTED
Barbiturates: NOT DETECTED
Benzodiazepines: NOT DETECTED
Cocaine: NOT DETECTED
Opiates: NOT DETECTED
Tetrahydrocannabinol: NOT DETECTED

## 2019-11-08 LAB — TROPONIN I (HIGH SENSITIVITY): Troponin I (High Sensitivity): 5 ng/L (ref <18)

## 2019-11-08 LAB — POC SARS CORONAVIRUS 2 AG -  ED: SARS Coronavirus 2 Ag: NEGATIVE

## 2019-11-08 LAB — CBG MONITORING, ED: Glucose-Capillary: 363 mg/dL — ABNORMAL HIGH (ref 70–99)

## 2019-11-08 MED ORDER — ONDANSETRON HCL 4 MG/2ML IJ SOLN
4.0000 mg | Freq: Once | INTRAMUSCULAR | Status: AC
  Administered 2019-11-08: 4 mg via INTRAVENOUS
  Filled 2019-11-08: qty 2

## 2019-11-08 MED ORDER — IOHEXOL 350 MG/ML SOLN
100.0000 mL | Freq: Once | INTRAVENOUS | Status: AC | PRN
  Administered 2019-11-08: 100 mL via INTRAVENOUS

## 2019-11-08 MED ORDER — MORPHINE SULFATE (PF) 4 MG/ML IV SOLN
4.0000 mg | Freq: Once | INTRAVENOUS | Status: AC
  Administered 2019-11-08: 4 mg via INTRAVENOUS
  Filled 2019-11-08: qty 1

## 2019-11-08 NOTE — ED Triage Notes (Signed)
Pt BIB [facility] for eval of fall/collapse while walking from living room to kitchen. Pt w/ hx of seizures, family did not report any noted shaking activity. Pt does not recall fall, pt is anticoagulated on xarelto, unsure if struck head./ GCS 15 on arrival #20G L AC PIV in place on arrival. Syncope vs seizure. Pt c/o R sided back/flank pain 8/10.

## 2019-11-08 NOTE — ED Provider Notes (Signed)
[facility] EMERGENCY DEPARTMENT Provider Note   CSN: [id number] Arrival date & time: 11/08/19  2002     History   Chief Complaint Chief Complaint  Patient presents with   Fall    HPI YARAH RIMA is a 48 y.o. female.     HPI   TALEIYAH GREN is a 48 y.o. female, with a history of DVT, PE, seizures, presenting to the ED for evaluation following possible syncope that occurred shortly prior to arrival. Patient states she was watching TV, got up, helped her father move a case of water, and then the next thing she remembers the paramedics were asking her questions.  Family reported no seizure activity. Patient notes she has felt short of breath for the last 2 weeks. Patient complains of pain to the right upper chest and central chest, moderate to severe, nonradiating, worse with palpation or movement.  She states this pain was not present before the event. Patient does mention she has had syncopal event due to PE in the past. Denies fever/chills, cough, vomiting, diarrhea, intraoral trauma, incontinence, headache, vision abnormalities, or any other complaints.   Past Medical History:  Diagnosis Date   Anemia    Arthritis    Left leg   Asthma    BV (bacterial vaginosis) 0000000   Complication of anesthesia    Seizures in PACU after surgery 3/15   DM (diabetes mellitus) ([place])    diagnosed 2009   DVT (deep venous thrombosis) ([place]) 2011   pt had IUD; also 05/2012   Dysfunctional uterine bleeding    Seen by Dr. Leo Grosser, GYN; pending hysterectomy as of 07/2012   H/O hypercoagulable state    2/2 contraception   Headache(784.0)    migraines   Herpes simplex without mention of complication 0000000   HSV-2   HLD (hyperlipidemia)    Hypertension    Infection    UTI   Labial lesion 2013   Major depression    Hx suicide attempt in 2009, [facility] admissions   Mastodynia    Neuromuscular disorder ([place])    neuropathy   Neuropathy    Obesity      Oligomenorrhea 2011   PE (pulmonary embolism)    2009 - on oral contraception; multiple   Post - coital bleeding 2012   Seizures ([place]) 2015   last episode May 2015   Sleep apnea    SORE THROAT 08/10/2010   Qualifier: Diagnosis of  By: Vanessa Kick MD, Saralyn Pilar     Status post placement of implantable loop recorder    Stroke [facility])    TIA 2013   Syncopal episodes    unknown etiology   Thyroid disease    hypothyroidism (pt denies)   Vaginal Pap smear, abnormal    Venous insufficiency    Vitamin D deficiency    unknown to pt.    Patient Active Problem List   Diagnosis Date Noted   Transient loss of consciousness 11/09/2019   Weakness of right leg 11/09/2019   Multiple idiopathic pulmonary cysts 11/09/2019   Hypertensive urgency 11/09/2019   Reaction to severe stress 03/07/2016   Bleeding per rectum 11/29/2015   Acute pansinusitis 10/20/2015   Candida vaginitis 10/20/2015   Cerebrovascular accident, late effects 08/30/2015   D (diarrhea) 08/02/2015   Seizure disorder ([place]) 07/13/2015   Adnexal pain 06/08/2015   Right flank pain 05/30/2015   Chronic pulmonary embolism ([place]) 05/14/2015   Long term current use of anticoagulant 05/14/2015   Seizures ([place]) 05/07/2014  Absence of bladder continence 03/27/2014   Arthropathia 02/25/2014   Chronic chest pain 02/25/2014   Healed or old pulmonary embolism 02/25/2014   HLD (hyperlipidemia) 02/25/2014   Headache, migraine 02/25/2014   Obstructive apnea 02/25/2014   Uncontrolled diabetes mellitus with hyperglycemia ([place]) 02/25/2014   Incontinence 02/13/2014   S/P vaginal hysterectomy 01/29/2013   TIA (transient ischemic attack) 08/03/2012   Left-sided weakness 08/03/2012   Hypokalemia 08/03/2012   Chest pain 08/03/2012   Temporary cerebral vascular dysfunction 08/03/2012   History of pulmonary embolism 05/18/2012   Patellar tendinitis of left knee 08/21/2011   Knee pain, left 07/07/2011    PRURITUS 07/04/2010   Syncope and collapse 10/18/2009   Allergic rhinitis 08/11/2009   Unspecified vitamin D deficiency 07/21/2009   Adiposity 07/19/2009   Apnea, sleep 07/19/2009   ANEMIA 07/10/2008   Essential hypertension 07/08/2008   Insulin dependent diabetes mellitus with complications ([place]) [id number]   Peripheral neuropathy 01/22/2008   Asthma 01/22/2008   Mononeuritis 01/22/2008    Past Surgical History:  Procedure Laterality Date   BILATERAL SALPINGECTOMY  12/18/2012   Procedure: BILATERAL SALPINGECTOMY;  Surgeon: Eldred Manges, MD;  Location: [facility];  Service: Gynecology;  Laterality: Bilateral;   BLADDER SUSPENSION N/A 02/13/2014   Procedure: TRANSVAGINAL TAPE (TVT) PROCEDURE;  Surgeon: Delice Lesch, MD;  Location: [facility];  Service: Gynecology;  Laterality: N/A;   BREAST REDUCTION SURGERY     Dr. Eugene Garnet [place] DFT     Implantable Loop recorder; no arrhythmias associated with synocpal spells; loop explanted 07-2013   CHOLECYSTECTOMY     DILATATION & CURRETTAGE/HYSTEROSCOPY WITH RESECTOCOPE  2007 & 2013   ENDOMETRIAL BIOPSY  2011   IUD REMOVAL  12/18/2012   Procedure: INTRAUTERINE DEVICE (IUD) REMOVAL;  Surgeon: Eldred Manges, MD;  Location: [facility];  Service: Gynecology;  Laterality: N/A;  Removed during prep by E. Powell PA   LAPAROSCOPIC ASSISTED VAGINAL HYSTERECTOMY  12/18/2012   Procedure: LAPAROSCOPIC ASSISTED VAGINAL HYSTERECTOMY;  Surgeon: Eldred Manges, MD;  Location: [facility];  Service: Gynecology;  Laterality: N/A;   LOOP RECORDER EXPLANT N/A 07/17/2013   Procedure: LOOP RECORDER EXPLANT;  Surgeon: Thompson Grayer, MD;  Location[facility];  Service: Cardiovascular;  Laterality: N/A;   TRANSVAGINAL TAPE (TVT) REMOVAL N/A 04/28/2014   Procedure: Removal of suburethral mesh;  Surgeon: Delice Lesch, MD;  Location: [facility];  Service: Gynecology;  Laterality: N/A;   WISDOM TOOTH EXTRACTION        OB History    Gravida  0   Para  0   Term      Preterm      AB      Living        SAB      TAB      Ectopic      Multiple      Live Births               Home Medications    Prior to Admission medications   Medication Sig Start Date End Date Taking? Authorizing Provider  acetaminophen (TYLENOL) 500 MG tablet Take 1,000 mg by mouth every 6 (six) hours as needed for moderate pain.    Yes [provider]  albuterol (PROVENTIL HFA;VENTOLIN HFA) 108 (90 BASE) MCG/ACT inhaler Inhale 2 puffs into the lungs every 6 (six) hours as needed for wheezing or shortness of breath.   Yes [provider]  amLODipine (NORVASC) 5  MG tablet Take 2 tablets (10 mg total) by mouth daily. Patient taking differently: Take 10 mg by mouth at bedtime.  12/25/18  Yes Mosetta Anis, MD  atorvastatin (LIPITOR) 10 MG tablet Take 10 mg by mouth at bedtime.   Yes [provider]  cetirizine (ZYRTEC) 10 MG tablet Take 1 tablet (10 mg total) by mouth daily. Patient taking differently: Take 10 mg by mouth at bedtime.  10/21/18  Yes Dorrell, Andree Elk, MD  chlorthalidone (HYGROTON) 25 MG tablet TAKE 2 TABLETS(50 MG) BY MOUTH DAILY Patient taking differently: Take 50 mg by mouth at bedtime.  07/21/19  Yes Aslam, Loralyn [place], MD  EPINEPHrine 0.3 mg/0.3 mL IJ SOAJ injection Inject 0.3 mg into the muscle as needed for anaphylaxis.  04/30/18  Yes [provider]  escitalopram (LEXAPRO) 10 MG tablet Take 10 mg by mouth at bedtime.  03/15/18  Yes [provider]  fluticasone (FLONASE) 50 MCG/ACT nasal spray Place 1 spray into both nostrils daily as needed (for nasal congestion/sinus infection).  10/18/15  Yes [provider]  levETIRAcetam (KEPPRA) 500 MG tablet Take 500 mg by mouth at bedtime.  01/14/15 11/08/19 Yes [provider]  lisinopril (ZESTRIL) 5 MG tablet Take 2.5 mg by mouth daily. 10/29/19  Yes [provider]  montelukast (SINGULAIR) 10  MG tablet Take 10 mg by mouth at bedtime. Reported on 02/16/2016 04/13/15  Yes [provider]  ondansetron (ZOFRAN) 4 MG tablet Take 4 mg by mouth as needed for nausea/vomiting.   Yes [provider]  pregabalin (LYRICA) 100 MG capsule TAKE 1 CAPSULE(100 MG) BY MOUTH THREE TIMES DAILY Patient taking differently: Take 100 mg by mouth 3 (three) times daily.  02/09/19  Yes Dorrell, Andree Elk, MD  rivaroxaban (XARELTO) 20 MG TABS tablet Take 20 mg by mouth at bedtime.    Yes [provider]  SYMBICORT 160-4.5 MCG/ACT inhaler Inhale 2 puffs into the lungs 2 (two) times daily. 05/25/19  Yes [provider]  topiramate (TOPAMAX) 25 MG tablet Take 25 mg by mouth at bedtime.    Yes [provider]  TRULICITY 1.5 0000000 SOPN Inject 1.5 mg into the skin once a week. Fridays 10/23/19  Yes [provider]  rizatriptan (MAXALT-MLT) 5 MG disintegrating tablet Take 1 tablet (5 mg total) by mouth as needed for migraine. May repeat in 2 hours if needed Patient not taking: Reported on 11/08/2019 05/07/14   Marcial Pacas, MD    Family History Family History  Problem Relation Age of Onset   Melanoma Father 35   Diabetes Father    Hypertension Father    Kidney disease Father    Ulcerative colitis Mother 20   Diabetes Mother    Hypertension Mother    Breast cancer Paternal Grandmother    Cancer Paternal Grandmother    Diabetes type II Maternal Grandmother        deceased 53   Diabetes Maternal Grandmother    Pulmonary embolism Paternal Grandfather    Stroke Maternal Grandfather     Social History Social History   Tobacco Use   Smoking status: Never Smoker   Smokeless tobacco: Never Used  Substance Use Topics   Alcohol use: No   Drug use: No     Allergies   Silver, Codeine, Darvocet [propoxyphene n-acetaminophen], Hydrocodone-acetaminophen, Hydrocodone-acetaminophen, Latex, Tape, and Tramadol   Review of Systems Review of Systems   Constitutional: Negative for chills and fever.  Eyes: Negative for visual disturbance.  Respiratory: Positive for  shortness of breath. Negative for cough.   Cardiovascular: Positive for chest pain. Negative for leg swelling.  Gastrointestinal: Positive for abdominal pain. Negative for blood in stool, diarrhea, nausea and vomiting.  Genitourinary: Negative for dysuria and hematuria.  Musculoskeletal: Positive for back pain and neck pain.  Neurological: Positive for syncope. Negative for dizziness and headaches.  All other systems reviewed and are negative.    Physical Exam Updated Vital Signs BP (!) 152/96    Pulse (!) 103 Comment: Simultaneous filing. User may not have seen previous data.   Temp 98.5 F (36.9 C) (Oral)    Resp 20 Comment: Simultaneous filing. User may not have seen previous data.   Ht 5' (1.524 m)    Wt 73.9 kg    LMP 11/06/2012    SpO2 97% Comment: Simultaneous filing. User may not have seen previous data.   BMI 31.83 kg/m   Physical Exam Vitals signs and nursing note reviewed.  Constitutional:      General: She is not in acute distress.    Appearance: She is well-developed. She is not diaphoretic.  HENT:     Head: Normocephalic and atraumatic.     Mouth/Throat:     Mouth: Mucous membranes are moist.     Pharynx: Oropharynx is clear.  Eyes:     Conjunctiva/sclera: Conjunctivae normal.  Neck:     Musculoskeletal: Neck supple.      Comments: Tenderness to the base of the cervical spine, as shown.  No noted deformity, instability, swelling, or color change. Patient is in a c-collar on arrival. Cardiovascular:     Rate and Rhythm: Regular rhythm. Tachycardia present.     Pulses: Normal pulses.          Radial pulses are 2+ on the right side and 2+ on the left side.       Posterior tibial pulses are 2+ on the right side and 2+ on the left side.     Heart sounds: Normal heart sounds.     Comments: Tactile temperature in the extremities appropriate and equal  bilaterally. Pulmonary:     Effort: Pulmonary effort is normal. No respiratory distress.     Breath sounds: Normal breath sounds.  Chest:     Chest wall: Tenderness present. No deformity, swelling or crepitus.       Comments: No tenderness over the clavicles. Abdominal:     Palpations: Abdomen is soft.     Tenderness: There is abdominal tenderness. There is no guarding.    Musculoskeletal:       Back:     Right lower leg: No edema.     Left lower leg: No edema.     Comments: No tenderness, deformity, instability, or swelling to the bilateral hips.  No pain with range of motion.  No noted deformity or instability to the hips.  Pelvis appears to be stable.  No tenderness, swelling, or pain with range of motion of the major joints of the upper or lower extremities bilaterally.  Overall trauma exam performed without any abnormalities noted other than those mentioned.  Lymphadenopathy:     Cervical: No cervical adenopathy.  Skin:    General: Skin is warm and dry.  Neurological:     Mental Status: She is alert and oriented to person, place, and time.     Comments: Sensation grossly intact to light touch in the extremities. No noted speech deficits. No aphasia. Patient handles oral secretions without difficulty. No noted swallowing defects.  Equal grip  strength bilaterally. No arm drift. Strength 5/5 in the upper extremities. Strength 5/5 in the left lower extremity. Right leg with weakness, but with tone resistance when I move the right leg without warning the patient first.  Patellar DTRs 2+ bilaterally. Coordination intact. Cranial nerves III-XII grossly intact.  No noted visual field deficit. No facial droop.   Psychiatric:        Mood and Affect: Mood and affect normal.        Speech: Speech normal.        Behavior: Behavior normal.      ED Treatments / Results  Labs (all labs ordered are listed, but only abnormal results are displayed) Labs Reviewed    COMPREHENSIVE METABOLIC PANEL - Abnormal; Notable for the following components:      Result Value   Sodium 133 (*)    Chloride 95 (*)    Glucose, Bld 390 (*)    Total Protein 8.2 (*)    All other components within normal limits  CBC WITH DIFFERENTIAL/PLATELET - Abnormal; Notable for the following components:   RBC 5.36 (*)    MCV 79.3 (*)    Lymphs Abs 4.8 (*)    All other components within normal limits  URINALYSIS, ROUTINE W REFLEX MICROSCOPIC - Abnormal; Notable for the following components:   Color, Urine STRAW (*)    Glucose, UA >=500 (*)    Bacteria, UA RARE (*)    All other components within normal limits  CBG MONITORING, ED - Abnormal; Notable for the following components:   Glucose-Capillary 363 (*)    All other components within normal limits  SARS CORONAVIRUS 2 (TAT 6-24 HRS)  ETHANOL  RAPID URINE DRUG SCREEN, HOSP PERFORMED  POC SARS CORONAVIRUS 2 AG -  ED  TROPONIN I (HIGH SENSITIVITY)  TROPONIN I (HIGH SENSITIVITY)    EKG EKG Interpretation  Date/Time:  Saturday November 08 2019 20:20:17 EST Ventricular Rate:  113 PR Interval:    QRS Duration: 69 QT Interval:  304 QTC Calculation: 417 R Axis:   31 Text Interpretation: Sinus tachycardia Ventricular premature complex Aberrant complex Borderline T wave abnormalities Confirmed by Quintella Reichert [phone]) on 11/08/2019 9:36:23 PM   Radiology Ct Head Wo Contrast  Result Date: 11/08/2019 CLINICAL DATA:  Syncope, fall. EXAM: CT HEAD WITHOUT CONTRAST TECHNIQUE: Contiguous axial images were obtained from the base of the skull through the vertex without intravenous contrast. COMPARISON:  05/25/2016 FINDINGS: Brain: No acute intracranial abnormality. Specifically, no hemorrhage, hydrocephalus, mass lesion, acute infarction, or significant intracranial injury. Vascular: No hyperdense vessel or unexpected calcification. Skull: No acute calvarial abnormality. Sinuses/Orbits: Visualized paranasal sinuses and mastoids  clear. Orbital soft tissues unremarkable. Other: None IMPRESSION: Normal study. Electronically Signed   By: Rolm Baptise M.D.   On: 11/08/2019 23:27   Ct Angio Chest Pe W And/or Wo Contrast  Result Date: 11/08/2019 CLINICAL DATA:  Fall while walking from living room to kitchen, history of seizures, no noted seizure activity, anticoagulated EXAM: CT ANGIOGRAPHY CHEST CT ABDOMEN AND PELVIS WITH CONTRAST TECHNIQUE: Multidetector CT imaging of the chest was performed using the standard protocol during bolus administration of intravenous contrast. Multiplanar CT image reconstructions and MIPs were obtained to evaluate the vascular anatomy. Multidetector CT imaging of the abdomen and pelvis was performed using the standard protocol during bolus administration of intravenous contrast. CONTRAST:  138mL OMNIPAQUE IOHEXOL 350 MG/ML SOLN COMPARISON:  CT abdomen pelvis 10/20/2017, CTA chest 05/12/2015 FINDINGS: CTA CHEST FINDINGS Cardiovascular: Satisfactory opacification the pulmonary arteries to  the segmental level. Stable web-like filling defects are present in branching of the left lower lobar artery. No acute hypoattenuating filling defect is seen. Central pulmonary arteries are normal caliber. Normal caliber of the thoracic aorta. No acute luminal abnormality, periaortic stranding or hemorrhage. Normal 3 vessel branching of the aortic arch. Normal heart size. No pericardial effusion. Mediastinum/Nodes: No enlarged mediastinal, hilar, or axillary lymph nodes. Thyroid gland and thoracic inlet are unremarkable. No acute abnormality of the trachea. Small amount of ingested material within the esophagus. Lungs/Pleura: Numerous pulmonary cysts are similar to prior. Mosaic attenuation within the lungs likely related to imaging during exhalation though a possible air trapping or small airways disease could have a similar appearance. No consolidative opacity. No acute traumatic abnormality of the lung parenchyma. Basilar  atelectatic changes are noted. No pneumothorax or effusion. Musculoskeletal: No chest wall abnormality. No acute or significant osseous findings. Prominent vascular channel in the left anterior articular facet of T9 is stable from prior. Review of the MIP images confirms the above findings. CT ABDOMEN and PELVIS FINDINGS Hepatobiliary: No focal hepatic injury or perihepatic hematoma. Diffuse hepatic hypoattenuation compatible with hepatic steatosis. No focal liver abnormality is seen. Patient is post cholecystectomy. Slight prominence of the biliary tree likely related to reservoir effect. No calcified intraductal gallstones. Pancreas: Unremarkable. No pancreatic ductal dilatation or surrounding inflammatory changes. Spleen: Normal arterial enhancement of the spleen. No splenic injury or perisplenic hematoma. Adrenals/Urinary Tract: No adrenal hematoma or suspicious adrenal lesions. Kidneys enhance and excrete symmetrically. No direct renal injury or perirenal hematoma. No suspicious renal lesions, urolithiasis or hydronephrosis. No acute bladder injury or other abnormality. Stomach/Bowel: Distal esophagus, stomach and duodenal sweep are unremarkable. No small bowel wall thickening or dilatation. No evidence of obstruction. A normal appendix is visualized. No colonic dilatation or wall thickening. Vascular/Lymphatic: The aorta is normal caliber. No suspicious or enlarged lymph nodes in the included lymphatic chains. Reproductive: Uterus is surgically absent. Retained ovaries. 2.9 cm cyst adjacent the left ovary. No other concerning adnexal lesion. Other: No free fluid or free air. No traumatic abdominal injury. No bowel containing hernias. Musculoskeletal: Multilevel degenerative changes are present in the imaged portions of the spine. Additional degenerative changes in the hips. No acute osseous abnormality or suspicious osseous lesion. Few stable bone islands in the left pelvis. Review of the MIP images confirms  the above findings. IMPRESSION: 1. No acute traumatic injury of the chest, abdomen or pelvis. 2. Stable web-like filling defects in the branching of the left lower lobar artery, favored to represent chronic pulmonary emboli. 3. No acute pulmonary arterial filling defects are identified. No evidence of right heart strain. 4. Mosaic attenuation of the lungs may reflect imaging during exhalation or underlying air trapping/small airways disease. 5. 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 Consider nonemergent high-resolution chest CT for further evaluation. 6. Post hysterectomy with retained ovaries. 2.9 cm left adnexal cyst in the left ovary is within physiologic normal for a premenopausal female. 7. Hepatic steatosis. 8. Aortic Atherosclerosis (ICD10-I70.0) Electronically Signed   By: Lovena Le M.D.   On: 11/08/2019 23:42   Ct Cervical Spine Wo Contrast  Result Date: 11/08/2019 CLINICAL DATA:  Syncope, fall EXAM: CT CERVICAL SPINE WITHOUT CONTRAST TECHNIQUE: Multidetector CT imaging of the cervical spine was performed without intravenous contrast. Multiplanar CT image reconstructions were also generated. COMPARISON:  05/18/2012 FINDINGS: Alignment: No subluxation.  Loss of cervical lordosis. Skull base and vertebrae: No acute fracture. No primary  bone lesion or focal pathologic process. Soft tissues and spinal canal: No prevertebral fluid or swelling. No visible canal hematoma. Disc levels: Early spurring. Ossification of the posterior longitudinal ligament. Upper chest: Negative Other: None IMPRESSION: No acute bony abnormality. Electronically Signed   By: Rolm Baptise M.D.   On: 11/08/2019 23:28   Ct Abdomen Pelvis W Contrast  Result Date: 11/08/2019 CLINICAL DATA:  Fall while walking from living room to kitchen, history of seizures, no noted seizure activity, anticoagulated EXAM: CT ANGIOGRAPHY CHEST CT ABDOMEN AND PELVIS WITH  CONTRAST TECHNIQUE: Multidetector CT imaging of the chest was performed using the standard protocol during bolus administration of intravenous contrast. Multiplanar CT image reconstructions and MIPs were obtained to evaluate the vascular anatomy. Multidetector CT imaging of the abdomen and pelvis was performed using the standard protocol during bolus administration of intravenous contrast. CONTRAST:  16mL OMNIPAQUE IOHEXOL 350 MG/ML SOLN COMPARISON:  CT abdomen pelvis 10/20/2017, CTA chest 05/12/2015 FINDINGS: CTA CHEST FINDINGS Cardiovascular: Satisfactory opacification the pulmonary arteries to the segmental level. Stable web-like filling defects are present in branching of the left lower lobar artery. No acute hypoattenuating filling defect is seen. Central pulmonary arteries are normal caliber. Normal caliber of the thoracic aorta. No acute luminal abnormality, periaortic stranding or hemorrhage. Normal 3 vessel branching of the aortic arch. Normal heart size. No pericardial effusion. Mediastinum/Nodes: No enlarged mediastinal, hilar, or axillary lymph nodes. Thyroid gland and thoracic inlet are unremarkable. No acute abnormality of the trachea. Small amount of ingested material within the esophagus. Lungs/Pleura: Numerous pulmonary cysts are similar to prior. Mosaic attenuation within the lungs likely related to imaging during exhalation though a possible air trapping or small airways disease could have a similar appearance. No consolidative opacity. No acute traumatic abnormality of the lung parenchyma. Basilar atelectatic changes are noted. No pneumothorax or effusion. Musculoskeletal: No chest wall abnormality. No acute or significant osseous findings. Prominent vascular channel in the left anterior articular facet of T9 is stable from prior. Review of the MIP images confirms the above findings. CT ABDOMEN and PELVIS FINDINGS Hepatobiliary: No focal hepatic injury or perihepatic hematoma. Diffuse hepatic  hypoattenuation compatible with hepatic steatosis. No focal liver abnormality is seen. Patient is post cholecystectomy. Slight prominence of the biliary tree likely related to reservoir effect. No calcified intraductal gallstones. Pancreas: Unremarkable. No pancreatic ductal dilatation or surrounding inflammatory changes. Spleen: Normal arterial enhancement of the spleen. No splenic injury or perisplenic hematoma. Adrenals/Urinary Tract: No adrenal hematoma or suspicious adrenal lesions. Kidneys enhance and excrete symmetrically. No direct renal injury or perirenal hematoma. No suspicious renal lesions, urolithiasis or hydronephrosis. No acute bladder injury or other abnormality. Stomach/Bowel: Distal esophagus, stomach and duodenal sweep are unremarkable. No small bowel wall thickening or dilatation. No evidence of obstruction. A normal appendix is visualized. No colonic dilatation or wall thickening. Vascular/Lymphatic: The aorta is normal caliber. No suspicious or enlarged lymph nodes in the included lymphatic chains. Reproductive: Uterus is surgically absent. Retained ovaries. 2.9 cm cyst adjacent the left ovary. No other concerning adnexal lesion. Other: No free fluid or free air. No traumatic abdominal injury. No bowel containing hernias. Musculoskeletal: Multilevel degenerative changes are present in the imaged portions of the spine. Additional degenerative changes in the hips. No acute osseous abnormality or suspicious osseous lesion. Few stable bone islands in the left pelvis. Review of the MIP images confirms the above findings. IMPRESSION: 1. No acute traumatic injury of the chest, abdomen or pelvis. 2. Stable web-like filling defects in the  branching of the left lower lobar artery, favored to represent chronic pulmonary emboli. 3. No acute pulmonary arterial filling defects are identified. No evidence of right heart strain. 4. Mosaic attenuation of the lungs may reflect imaging during exhalation or  underlying air trapping/small airways disease. 5. 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 Consider nonemergent high-resolution chest CT for further evaluation. 6. Post hysterectomy with retained ovaries. 2.9 cm left adnexal cyst in the left ovary is within physiologic normal for a premenopausal female. 7. Hepatic steatosis. 8. Aortic Atherosclerosis (ICD10-I70.0) Electronically Signed   By: Lovena Le M.D.   On: 11/08/2019 23:42   Dg Pelvis Portable  Result Date: 11/08/2019 CLINICAL DATA:  Fall EXAM: PORTABLE PELVIS 1-2 VIEWS COMPARISON:  Radiograph 01/02/2008 FINDINGS: Appearance worrisome for a minimally impacted subcapital left femoral neck fracture. Femoral heads remain normally located. No other acute pelvic fracture or traumatic diastasis. Degenerative changes noted in the SI joints and both hips. Included portions of the lumbar spine are unremarkable aside from mild discogenic change. Bowel gas pattern is normal. Soft tissues are unremarkable. IMPRESSION: Findings worrisome for a minimally impacted subcapital left femoral neck fracture. Electronically Signed   By: Lovena Le M.D.   On: 11/08/2019 21:41   Ct L-spine No Charge  Result Date: 11/08/2019 CLINICAL DATA:  Fall EXAM: CT LUMBAR SPINE WITHOUT CONTRAST TECHNIQUE: Multiplanar CT reconstructions of the lumbar spine were performed from concurrent CT of the chest, abdomen and pelvis. COMPARISON:  CT abdomen pelvis 10/20/2017, CT L-spine 02/03/2013 FINDINGS: Segmentation: 5 lumbar type vertebrae. Alignment: Preservation of the normal lumbar lordosis. Mild grade 1 anterolisthesis L5 on S1 is stable from comparison in 2014. Posterior elements are normally aligned without abnormal facet widening. Vertebrae: No acute vertebral body fracture, height loss other focal pathologic process. Posterior elements are intact. Included portions of the sacrum and pelvis are unremarkable  aside from vacuum phenomenon in both SI joints and mild SI joint widening, similar to comparison. Paraspinal and other soft tissues: No paravertebral fluid or swelling. Mild posterior body wall edema. For findings in the abdomen and pelvis, please see dedicated CT from which this study is generated. Disc levels: Minimal posterior disc bulging at L4-5 and L5-S1 without significant canal stenosis. Posterior facet hypertrophic changes are most pronounced at the same levels with resulting mild bilateral foraminal narrowing at L4-5 and moderate bilateral neural foraminal narrowing at L5-S1. IMPRESSION: 1. No acute fracture or subluxation of the lumbar spine. 2. Mild grade 1 anterolisthesis L5 on S1 is stable from comparison in 2014. 3. Posterior facet hypertrophic changes at L4-5 and L5-S1 resulting in mild bilateral foraminal narrowing at L4-5 and moderate bilateral neural foraminal narrowing at L5-S1. 4. For findings in the abdomen and pelvis, please see dedicated CT from which this study is generated. Electronically Signed   By: Lovena Le M.D.   On: 11/08/2019 23:51   Dg Chest Portable 1 View  Result Date: 11/08/2019 CLINICAL DATA:  Fall, chest pain EXAM: PORTABLE CHEST 1 VIEW COMPARISON:  02/27/2018 FINDINGS: Heart and mediastinal contours are within normal limits. No focal opacities or effusions. No acute bony abnormality. IMPRESSION: No active disease. Electronically Signed   By: Rolm Baptise M.D.   On: 11/08/2019 21:40    Procedures Procedures (including critical care time)  Medications Ordered in ED Medications  iohexol (OMNIPAQUE) 350 MG/ML injection 100 mL (100 mLs Intravenous Contrast Given 11/08/19 2253)  morphine 4 MG/ML injection 4 mg (4 mg Intravenous Given 11/08/19  2339)  ondansetron (ZOFRAN) injection 4 mg (4 mg Intravenous Given 11/08/19 2339)     Initial Impression / Assessment and Plan / ED Course  I have reviewed the triage vital signs and the nursing notes.  Pertinent labs &  imaging results that were available during my care of the patient were reviewed by me and considered in my medical decision making (see chart for details).  Clinical Course as of Nov 08 114  Sat Nov 08, 2019  2220 Patient was reexamined after this report was published.  She has no tenderness in the left hip and no pain with range of motion of the left hip.  DG Pelvis Portable [SJ]  [id number] Patient reassessed.  She states her numbness and weakness the main right leg has improved. She is able to wiggle the toes of the right foot.   [SJ]  Sun Nov 09, 2019  0023 Spoke with Dr. Myna Hidalgo, hospitalist. Agrees to admit the patient.  We also discussed obtaining a spinal MRI.  I will place these orders.   [SJ]    Clinical Course User Index [SJ] Roxine Whittinghill C, PA-C       Patient presents following syncopal episode without preamble. GCS 15 on arrival. Patient is nontoxic appearing, afebrile, not tachypneic, not hypotensive, maintains excellent SPO2 on room air, and is in no apparent distress.  Initially tachycardic, but this improved during ED course. CTs without acute abnormality.  Possible abnormality noted on the left hip on x-ray was not appreciated on CT.  Delta troponins negative.  Due to the nature of her syncope, patient admitted for further assessment.  She also has MRIs pending at time of admission.  Findings and plan of care discussed with Quintella Reichert, MD. Dr. Ralene Bathe personally evaluated and examined this patient.    Vitals:   11/08/19 2230 11/08/19 2320 11/08/19 2330 11/08/19 2345  BP: (!) 152/96  (!) 192/118 (!) 161/103  Pulse: (!) 103  99 98  Resp: 20  20 15   Temp:      TempSrc:      SpO2: 97%  99% 99%  Weight:  73.9 kg    Height:  5' (1.524 m)       Final Clinical Impressions(s) / ED Diagnoses   Final diagnoses:  Back pain  Syncope and collapse    ED Discharge Orders    None       Layla Maw 11/09/19 0115    Quintella Reichert, MD 11/11/19 1315

## 2019-11-08 NOTE — ED Notes (Signed)
Patient transported to CT 

## 2019-11-09 ENCOUNTER — Encounter (HOSPITAL_COMMUNITY): Payer: Self-pay | Admitting: Family Medicine

## 2019-11-09 ENCOUNTER — Observation Stay (HOSPITAL_COMMUNITY): Payer: Medicare Other

## 2019-11-09 ENCOUNTER — Observation Stay (HOSPITAL_BASED_OUTPATIENT_CLINIC_OR_DEPARTMENT_OTHER): Payer: Medicare Other

## 2019-11-09 DIAGNOSIS — R29898 Other symptoms and signs involving the musculoskeletal system: Secondary | ICD-10-CM | POA: Diagnosis present

## 2019-11-09 DIAGNOSIS — G40909 Epilepsy, unspecified, not intractable, without status epilepticus: Secondary | ICD-10-CM

## 2019-11-09 DIAGNOSIS — R55 Syncope and collapse: Secondary | ICD-10-CM | POA: Diagnosis not present

## 2019-11-09 DIAGNOSIS — I160 Hypertensive urgency: Secondary | ICD-10-CM | POA: Diagnosis not present

## 2019-11-09 DIAGNOSIS — I10 Essential (primary) hypertension: Secondary | ICD-10-CM | POA: Diagnosis not present

## 2019-11-09 DIAGNOSIS — J984 Other disorders of lung: Secondary | ICD-10-CM

## 2019-11-09 DIAGNOSIS — E1165 Type 2 diabetes mellitus with hyperglycemia: Secondary | ICD-10-CM | POA: Diagnosis not present

## 2019-11-09 LAB — CBC
HCT: 40.5 % (ref 36.0–46.0)
Hemoglobin: 12.8 g/dL (ref 12.0–15.0)
MCH: 25.4 pg — ABNORMAL LOW (ref 26.0–34.0)
MCHC: 31.6 g/dL (ref 30.0–36.0)
MCV: 80.5 fL (ref 80.0–100.0)
Platelets: 312 10*3/uL (ref 150–400)
RBC: 5.03 MIL/uL (ref 3.87–5.11)
RDW: 14.5 % (ref 11.5–15.5)
WBC: 10.7 10*3/uL — ABNORMAL HIGH (ref 4.0–10.5)
nRBC: 0 % (ref 0.0–0.2)

## 2019-11-09 LAB — BASIC METABOLIC PANEL
Anion gap: 10 (ref 5–15)
BUN: 8 mg/dL (ref 6–20)
CO2: 28 mmol/L (ref 22–32)
Calcium: 9.3 mg/dL (ref 8.9–10.3)
Chloride: 97 mmol/L — ABNORMAL LOW (ref 98–111)
Creatinine, Ser: 0.72 mg/dL (ref 0.44–1.00)
GFR calc Af Amer: >60 mL/min (ref >60)
GFR calc non Af Amer: >60 mL/min (ref >60)
Glucose, Bld: 290 mg/dL — ABNORMAL HIGH (ref 70–99)
Potassium: 3.9 mmol/L (ref 3.5–5.1)
Sodium: 135 mmol/L (ref 135–145)

## 2019-11-09 LAB — TROPONIN I (HIGH SENSITIVITY): Troponin I (High Sensitivity): 12 ng/L (ref <18)

## 2019-11-09 LAB — GLUCOSE, CAPILLARY
Glucose-Capillary: 290 mg/dL — ABNORMAL HIGH (ref 70–99)
Glucose-Capillary: 271 mg/dL — ABNORMAL HIGH (ref 70–99)
Glucose-Capillary: 194 mg/dL — ABNORMAL HIGH (ref 70–99)
Glucose-Capillary: 191 mg/dL — ABNORMAL HIGH (ref 70–99)

## 2019-11-09 LAB — ECHOCARDIOGRAM COMPLETE
Height: 60 in
Weight: 2608 oz

## 2019-11-09 LAB — HEMOGLOBIN A1C
Hgb A1c MFr Bld: 9.9 % — ABNORMAL HIGH (ref 4.8–5.6)
Mean Plasma Glucose: 237.43 mg/dL

## 2019-11-09 LAB — SARS CORONAVIRUS 2 (TAT 6-24 HRS): SARS Coronavirus 2: NEGATIVE

## 2019-11-09 LAB — CBG MONITORING, ED
Glucose-Capillary: 254 mg/dL — ABNORMAL HIGH (ref 70–99)
Glucose-Capillary: 325 mg/dL — ABNORMAL HIGH (ref 70–99)

## 2019-11-09 LAB — HIV ANTIBODY (ROUTINE TESTING W REFLEX): HIV Screen 4th Generation wRfx: NONREACTIVE

## 2019-11-09 MED ORDER — SODIUM CHLORIDE 0.9% FLUSH: 3.0000 mL | INTRAVENOUS | Status: DC | PRN

## 2019-11-09 MED ORDER — INSULIN ASPART 100 UNIT/ML ~~LOC~~ SOLN
0.0000 [IU] | Freq: Three times a day (TID) | SUBCUTANEOUS | Status: DC
  Administered 2019-11-09: 5 [IU] via SUBCUTANEOUS
  Administered 2019-11-09: 7 [IU] via SUBCUTANEOUS

## 2019-11-09 MED ORDER — SODIUM CHLORIDE 0.9% FLUSH
3.0000 mL | Freq: Two times a day (BID) | INTRAVENOUS | Status: DC
  Administered 2019-11-10 – 2019-11-11 (×2): 3 mL via INTRAVENOUS

## 2019-11-09 MED ORDER — SODIUM CHLORIDE 0.9% FLUSH
3.0000 mL | Freq: Two times a day (BID) | INTRAVENOUS | Status: DC
  Administered 2019-11-09 – 2019-11-13 (×6): 3 mL via INTRAVENOUS

## 2019-11-09 MED ORDER — SODIUM CHLORIDE 0.9 % IV SOLN
INTRAVENOUS | Status: AC
  Administered 2019-11-09 – 2019-11-10 (×2): via INTRAVENOUS

## 2019-11-09 MED ORDER — TOPIRAMATE 25 MG PO TABS
25.0000 mg | ORAL_TABLET | Freq: Every day | ORAL | Status: DC
  Administered 2019-11-09 – 2019-11-12 (×5): 25 mg via ORAL
  Filled 2019-11-09 (×7): qty 1

## 2019-11-09 MED ORDER — SODIUM CHLORIDE 0.9 % IV SOLN: 250.0000 mL | INTRAVENOUS | Status: DC | PRN

## 2019-11-09 MED ORDER — ACETAMINOPHEN 325 MG PO TABS
650.0000 mg | ORAL_TABLET | Freq: Four times a day (QID) | ORAL | Status: DC | PRN
  Administered 2019-11-09 – 2019-11-11 (×3): 650 mg via ORAL
  Filled 2019-11-09 (×3): qty 2

## 2019-11-09 MED ORDER — ESCITALOPRAM OXALATE 10 MG PO TABS
10.0000 mg | ORAL_TABLET | Freq: Every day | ORAL | Status: DC
  Administered 2019-11-09 – 2019-11-12 (×5): 10 mg via ORAL
  Filled 2019-11-09 (×5): qty 1

## 2019-11-09 MED ORDER — MORPHINE SULFATE (PF) 2 MG/ML IV SOLN
2.0000 mg | INTRAVENOUS | Status: DC | PRN
  Administered 2019-11-09 – 2019-11-13 (×3): 2 mg via INTRAVENOUS
  Filled 2019-11-09 (×5): qty 1

## 2019-11-09 MED ORDER — RIVAROXABAN 20 MG PO TABS
20.0000 mg | ORAL_TABLET | Freq: Every day | ORAL | Status: DC
  Administered 2019-11-09 (×2): 20 mg via ORAL
  Filled 2019-11-09 (×2): qty 1

## 2019-11-09 MED ORDER — INSULIN ASPART 100 UNIT/ML ~~LOC~~ SOLN
0.0000 [IU] | Freq: Every day | SUBCUTANEOUS | Status: DC
  Administered 2019-11-09 – 2019-11-10 (×2): 3 [IU] via SUBCUTANEOUS
  Administered 2019-11-12: 2 [IU] via SUBCUTANEOUS

## 2019-11-09 MED ORDER — ONDANSETRON HCL 4 MG PO TABS: 4.0000 mg | ORAL_TABLET | Freq: Four times a day (QID) | ORAL | Status: DC | PRN

## 2019-11-09 MED ORDER — AMLODIPINE BESYLATE 10 MG PO TABS
10.0000 mg | ORAL_TABLET | Freq: Every day | ORAL | Status: DC
  Administered 2019-11-09 – 2019-11-11 (×2): 10 mg via ORAL
  Filled 2019-11-09: qty 1
  Filled 2019-11-09: qty 2
  Filled 2019-11-09 (×2): qty 1

## 2019-11-09 MED ORDER — ATORVASTATIN CALCIUM 10 MG PO TABS
10.0000 mg | ORAL_TABLET | Freq: Every day | ORAL | Status: DC
  Administered 2019-11-09 – 2019-11-12 (×4): 10 mg via ORAL
  Filled 2019-11-09 (×4): qty 1

## 2019-11-09 MED ORDER — SODIUM CHLORIDE 0.9 % IV BOLUS
500.0000 mL | Freq: Once | INTRAVENOUS | Status: AC
  Administered 2019-11-09: 500 mL via INTRAVENOUS

## 2019-11-09 MED ORDER — ACETAMINOPHEN 650 MG RE SUPP: 650.0000 mg | Freq: Four times a day (QID) | RECTAL | Status: DC | PRN

## 2019-11-09 MED ORDER — MONTELUKAST SODIUM 10 MG PO TABS
10.0000 mg | ORAL_TABLET | Freq: Every day | ORAL | Status: DC
  Administered 2019-11-09 – 2019-11-12 (×5): 10 mg via ORAL
  Filled 2019-11-09 (×5): qty 1

## 2019-11-09 MED ORDER — CHLORTHALIDONE 50 MG PO TABS
50.0000 mg | ORAL_TABLET | Freq: Every day | ORAL | Status: DC
  Administered 2019-11-09: 50 mg via ORAL
  Filled 2019-11-09 (×3): qty 1

## 2019-11-09 MED ORDER — MOMETASONE FURO-FORMOTEROL FUM 200-5 MCG/ACT IN AERO
2.0000 | INHALATION_SPRAY | Freq: Two times a day (BID) | RESPIRATORY_TRACT | Status: DC
  Administered 2019-11-09 – 2019-11-13 (×6): 2 via RESPIRATORY_TRACT
  Filled 2019-11-09: qty 8.8

## 2019-11-09 MED ORDER — LISINOPRIL 5 MG PO TABS
2.5000 mg | ORAL_TABLET | Freq: Every day | ORAL | Status: DC
  Administered 2019-11-09 – 2019-11-13 (×5): 2.5 mg via ORAL
  Filled 2019-11-09 (×5): qty 1

## 2019-11-09 MED ORDER — PREGABALIN 100 MG PO CAPS
100.0000 mg | ORAL_CAPSULE | Freq: Three times a day (TID) | ORAL | Status: DC
  Administered 2019-11-09 – 2019-11-13 (×12): 100 mg via ORAL
  Filled 2019-11-09 (×12): qty 1

## 2019-11-09 MED ORDER — ONDANSETRON HCL 4 MG/2ML IJ SOLN
4.0000 mg | Freq: Four times a day (QID) | INTRAMUSCULAR | Status: DC | PRN
  Administered 2019-11-09 – 2019-11-13 (×4): 4 mg via INTRAVENOUS
  Filled 2019-11-09 (×4): qty 2

## 2019-11-09 MED ORDER — INSULIN ASPART 100 UNIT/ML ~~LOC~~ SOLN
0.0000 [IU] | Freq: Three times a day (TID) | SUBCUTANEOUS | Status: DC
  Administered 2019-11-09: 3 [IU] via SUBCUTANEOUS
  Administered 2019-11-10: 5 [IU] via SUBCUTANEOUS
  Administered 2019-11-10: 3 [IU] via SUBCUTANEOUS
  Administered 2019-11-10: 5 [IU] via SUBCUTANEOUS
  Administered 2019-11-11: 3 [IU] via SUBCUTANEOUS
  Administered 2019-11-11: 5 [IU] via SUBCUTANEOUS
  Administered 2019-11-11: 3 [IU] via SUBCUTANEOUS
  Administered 2019-11-12: 5 [IU] via SUBCUTANEOUS
  Administered 2019-11-12: 3 [IU] via SUBCUTANEOUS
  Administered 2019-11-12: 5 [IU] via SUBCUTANEOUS
  Administered 2019-11-13: 3 [IU] via SUBCUTANEOUS
  Administered 2019-11-13: 8 [IU] via SUBCUTANEOUS

## 2019-11-09 MED ORDER — ALBUTEROL SULFATE (2.5 MG/3ML) 0.083% IN NEBU: 3.0000 mL | INHALATION_SOLUTION | Freq: Four times a day (QID) | RESPIRATORY_TRACT | Status: DC | PRN

## 2019-11-09 MED ORDER — POLYETHYLENE GLYCOL 3350 17 G PO PACK: 17.0000 g | PACK | Freq: Every day | ORAL | Status: DC | PRN

## 2019-11-09 MED ORDER — LEVETIRACETAM 500 MG PO TABS
500.0000 mg | ORAL_TABLET | Freq: Every day | ORAL | Status: DC
  Administered 2019-11-09 (×2): 500 mg via ORAL
  Filled 2019-11-09 (×2): qty 1

## 2019-11-09 NOTE — Progress Notes (Signed)
Manual BP is 89/58, messaged NP on call to make aware.   Eleanora Neighbor, RN

## 2019-11-09 NOTE — Progress Notes (Signed)
Pt BP is 85/59 and pt states that she feels like she has to urinate but cannot. NT did a bladder scan and pt is retaining > 833 mL. Messaged NP on call to make aware and for further instructions.    Eleanora Neighbor, RN

## 2019-11-09 NOTE — ED Notes (Signed)
Breakfast tray ordered 

## 2019-11-09 NOTE — H&P (Signed)
History and Physical    Makayla Frazier [id number] DOB: 12-29-1970 DOA: 11/08/2019  PCP: Harvie Heck, MD   Patient coming from: Home   Chief Complaint: Transient loss of consciousness, right-sided back pain, right leg numbness and weakness   HPI: Makayla Frazier is a 48 y.o. female with medical history significant for seizure disorder, PE on Xarelto, hypertension, type 2 diabetes mellitus, and asthma, now presenting to emergency department after a transient loss of consciousness and reports pain in the right side of her back as well as new right leg numbness and weakness.  Patient reports that she been in her usual state of health and was having an uneventful day when she was lifting up a case of water bottles and then lost consciousness.  Family was nearby, did not see her fall, but saw her immediately afterwards with no seizure-like activity.  EMS was called, patient woke with pain in her right back and numbness and weakness involving the right leg.  She denies any incontinence or tongue biting associated with this.  Husband is at the bedside and notes that the patient suffered similar episodes for many years with extensive evaluation that included loop recorder, and reports that the episodes were ultimately attributed to seizures.  Patient denies any recent fevers, chills, cough, shortness of breath, chest pain, or palpitations.  She reports continued adherence with her antiepileptics.  ED Course: Upon arrival to the ED, patient is found to be afebrile, saturating well on room air, tachycardic in the 110s, and with blood pressure 210/130.  EKG features sinus tachycardia with rate 113 and PVC.  Noncontrast head CT and cervical spine CT are negative for acute findings.  No acute fracture or subluxation is noted on lumbar spine CT.  No acute traumatic injury identified on CT chest/abdomen/pelvis in the ED which is notable for suspected chronic PE involving the left lower lobar artery without acute  PE or right heart strain.  Radiographs of the pelvis were concerning for possible minimally impacted left femoral neck fracture but this did not correlate clinically and was not seen on the CT pelvis.  Chemistry panel is notable for glucose of 390 and CBC unremarkable.  High-sensitivity troponin is normal x2.  UDS negative.  Urinalysis notable for glucosuria.  Patient was treated with morphine and Zofran in the ED, COVID-19 screening test has not yet resulted, and MRI lumbar and thoracic spine are ordered but not yet performed.  Review of Systems:  All other systems reviewed and apart from HPI, are negative.  Past Medical History:  Diagnosis Date   Anemia    Arthritis    Left leg   Asthma    BV (bacterial vaginosis) 0000000   Complication of anesthesia    Seizures in PACU after surgery 3/15   DM (diabetes mellitus) ([place])    diagnosed 2009   DVT (deep venous thrombosis) ([place]) 2011   pt had IUD; also 05/2012   Dysfunctional uterine bleeding    Seen by Dr. Leo Grosser, GYN; pending hysterectomy as of 07/2012   H/O hypercoagulable state    2/2 contraception   Headache(784.0)    migraines   Herpes simplex without mention of complication 0000000   HSV-2   HLD (hyperlipidemia)    Hypertension    Infection    UTI   Labial lesion 2013   Major depression    Hx suicide attempt in 2009, [facility] admissions   Mastodynia    Neuromuscular disorder ([place])    neuropathy   Neuropathy  Obesity    Oligomenorrhea 2011   PE (pulmonary embolism)    2009 - on oral contraception; multiple   Post - coital bleeding 2012   Seizures ([place]) 2015   last episode May 2015   Sleep apnea    SORE THROAT 08/10/2010   Qualifier: Diagnosis of  By: Vanessa Kick MD, Saralyn Pilar     Status post placement of implantable loop recorder    Stroke [facility])    TIA 2013   Syncopal episodes    unknown etiology   Thyroid disease    hypothyroidism (pt denies)   Vaginal Pap smear, abnormal    Venous  insufficiency    Vitamin D deficiency    unknown to pt.    Past Surgical History:  Procedure Laterality Date   BILATERAL SALPINGECTOMY  12/18/2012   Procedure: BILATERAL SALPINGECTOMY;  Surgeon: Eldred Manges, MD;  Location: [facility];  Service: Gynecology;  Laterality: Bilateral;   BLADDER SUSPENSION N/A 02/13/2014   Procedure: TRANSVAGINAL TAPE (TVT) PROCEDURE;  Surgeon: Delice Lesch, MD;  Location: [facility];  Service: Gynecology;  Laterality: N/A;   BREAST REDUCTION SURGERY     Dr. Eugene Garnet [place] [facility]     Implantable Loop recorder; no arrhythmias associated with synocpal spells; loop explanted 07-2013   CHOLECYSTECTOMY     DILATATION & CURRETTAGE/HYSTEROSCOPY WITH RESECTOCOPE  2007 & 2013   ENDOMETRIAL BIOPSY  2011   IUD REMOVAL  12/18/2012   Procedure: INTRAUTERINE DEVICE (IUD) REMOVAL;  Surgeon: Eldred Manges, MD;  Location: [facility];  Service: Gynecology;  Laterality: N/A;  Removed during prep by E. Powell PA   LAPAROSCOPIC ASSISTED VAGINAL HYSTERECTOMY  12/18/2012   Procedure: LAPAROSCOPIC ASSISTED VAGINAL HYSTERECTOMY;  Surgeon: Eldred Manges, MD;  Location: [facility];  Service: Gynecology;  Laterality: N/A;   LOOP RECORDER EXPLANT N/A 07/17/2013   Procedure: LOOP RECORDER EXPLANT;  Surgeon: Thompson Grayer, MD;  Location: [facility] CATH LAB;  Service: Cardiovascular;  Laterality: N/A;   TRANSVAGINAL TAPE (TVT) REMOVAL N/A 04/28/2014   Procedure: Removal of suburethral mesh;  Surgeon: Delice Lesch, MD;  Location: [facility];  Service: Gynecology;  Laterality: N/A;   WISDOM TOOTH EXTRACTION       reports that she has never smoked. She has never used smokeless tobacco. She reports that she does not drink alcohol or use drugs.  Allergies  Allergen Reactions   Silver Other (See Comments) and Rash   Codeine Nausea And Vomiting    Pt takes Percocet without problems    Darvocet [Propoxyphene N-Acetaminophen] Nausea And Vomiting    Hydrocodone-Acetaminophen Nausea And Vomiting   Hydrocodone-Acetaminophen Nausea And Vomiting    Vomiting   Latex Rash   Tape Itching and Rash   Tramadol Nausea Only    Family History  Problem Relation Age of Onset   Melanoma Father 92   Diabetes Father    Hypertension Father    Kidney disease Father    Ulcerative colitis Mother 69   Diabetes Mother    Hypertension Mother    Breast cancer Paternal Grandmother    Cancer Paternal Grandmother    Diabetes type II Maternal Grandmother        deceased 76   Diabetes Maternal Grandmother    Pulmonary embolism Paternal Grandfather    Stroke Maternal Grandfather      Prior to Admission medications   Medication Sig Start Date End Date Taking? Authorizing Provider  acetaminophen (TYLENOL) 500 MG tablet Take 1,000 mg  by mouth every 6 (six) hours as needed for moderate pain.    Yes [provider]  albuterol (PROVENTIL HFA;VENTOLIN HFA) 108 (90 BASE) MCG/ACT inhaler Inhale 2 puffs into the lungs every 6 (six) hours as needed for wheezing or shortness of breath.   Yes [provider]  amLODipine (NORVASC) 5 MG tablet Take 2 tablets (10 mg total) by mouth daily. Patient taking differently: Take 10 mg by mouth at bedtime.  12/25/18  Yes Mosetta Anis, MD  atorvastatin (LIPITOR) 10 MG tablet Take 10 mg by mouth at bedtime.   Yes [provider]  cetirizine (ZYRTEC) 10 MG tablet Take 1 tablet (10 mg total) by mouth daily. Patient taking differently: Take 10 mg by mouth at bedtime.  10/21/18  Yes Dorrell, Andree Elk, MD  chlorthalidone (HYGROTON) 25 MG tablet TAKE 2 TABLETS(50 MG) BY MOUTH DAILY Patient taking differently: Take 50 mg by mouth at bedtime.  07/21/19  Yes Aslam, Loralyn [place], MD  EPINEPHrine 0.3 mg/0.3 mL IJ SOAJ injection Inject 0.3 mg into the muscle as needed for anaphylaxis.  04/30/18  Yes [provider]  escitalopram (LEXAPRO) 10 MG tablet Take 10 mg by mouth at bedtime.  03/15/18  Yes  [provider]  fluticasone (FLONASE) 50 MCG/ACT nasal spray Place 1 spray into both nostrils daily as needed (for nasal congestion/sinus infection).  10/18/15  Yes [provider]  levETIRAcetam (KEPPRA) 500 MG tablet Take 500 mg by mouth at bedtime.  01/14/15 11/08/19 Yes [provider]  lisinopril (ZESTRIL) 5 MG tablet Take 2.5 mg by mouth daily. 10/29/19  Yes [provider]  montelukast (SINGULAIR) 10 MG tablet Take 10 mg by mouth at bedtime. Reported on 02/16/2016 04/13/15  Yes [provider]  ondansetron (ZOFRAN) 4 MG tablet Take 4 mg by mouth as needed for nausea/vomiting.   Yes [provider]  pregabalin (LYRICA) 100 MG capsule TAKE 1 CAPSULE(100 MG) BY MOUTH THREE TIMES DAILY Patient taking differently: Take 100 mg by mouth 3 (three) times daily.  02/09/19  Yes Dorrell, Andree Elk, MD  rivaroxaban (XARELTO) 20 MG TABS tablet Take 20 mg by mouth at bedtime.    Yes [provider]  SYMBICORT 160-4.5 MCG/ACT inhaler Inhale 2 puffs into the lungs 2 (two) times daily. 05/25/19  Yes [provider]  topiramate (TOPAMAX) 25 MG tablet Take 25 mg by mouth at bedtime.    Yes [provider]  TRULICITY 1.5 0000000 SOPN Inject 1.5 mg into the skin once a week. Fridays 10/23/19  Yes [provider]  rizatriptan (MAXALT-MLT) 5 MG disintegrating tablet Take 1 tablet (5 mg total) by mouth as needed for migraine. May repeat in 2 hours if needed Patient not taking: Reported on 11/08/2019 05/07/14   Marcial Pacas, MD    Physical Exam: Vitals:   11/08/19 2320 11/08/19 2330 11/08/19 2345 11/09/19 0000  BP:  (!) 192/118 (!) 161/103 (!) 159/112  Pulse:  99 98 98  Resp:  20 15 14   Temp:      TempSrc:      SpO2:  99% 99% 97%  Weight: 73.9 kg     Height: 5' (1.524 m)       Constitutional: NAD, calm  Eyes: PERTLA, lids and conjunctivae normal ENMT: Mucous membranes are moist. Posterior pharynx clear of any exudate or  lesions.   Neck: normal, supple, no masses, no thyromegaly Respiratory: no wheezing, no crackles. Normal respiratory effort. No accessory muscle use.  Cardiovascular: S1 & S2  heard, regular rate and rhythm. No extremity edema.   Abdomen: No distension, no tenderness, soft. Bowel sounds normal.  Musculoskeletal: no clubbing / cyanosis. No joint deformity upper and lower extremities.   Skin: no significant rashes, lesions, ulcers. Warm, dry, well-perfused. Neurologic: CN 2-12 grossly intact. Sensation to light touch diminished in distal RLE. Strength 3/5 throughout RLE, otherwise 5/5.  Psychiatric: Alert and oriented to person, place, and situation. Calm, cooperative.     Labs on Admission: I have personally reviewed following labs and imaging studies  CBC: Recent Labs  Lab 11/08/19 2116  WBC 10.0  NEUTROABS 4.4  HGB 14.0  HCT 42.5  MCV 79.3*  PLT [id number]   Basic Metabolic Panel: Recent Labs  Lab 11/08/19 2116  NA 133*  K 4.0  CL 95*  CO2 26  GLUCOSE 390*  BUN 9  CREATININE 0.82  CALCIUM 9.7   GFR: Estimated Creatinine Clearance: 75.4 mL/min (by C-G formula based on SCr of 0.82 mg/dL). Liver Function Tests: Recent Labs  Lab 11/08/19 2116  AST 36  ALT 44  ALKPHOS 64  BILITOT 0.5  PROT 8.2*  ALBUMIN 3.8   No results for input(s): LIPASE, AMYLASE in the last 168 hours. No results for input(s): AMMONIA in the last 168 hours. Coagulation Profile: No results for input(s): INR, PROTIME in the last 168 hours. Cardiac Enzymes: No results for input(s): CKTOTAL, CKMB, CKMBINDEX, TROPONINI in the last 168 hours. BNP (last 3 results) No results for input(s): PROBNP in the last 8760 hours. HbA1C: No results for input(s): HGBA1C in the last 72 hours. CBG: Recent Labs  Lab 11/08/19 2012  GLUCAP 363*   Lipid Profile: No results for input(s): CHOL, HDL, LDLCALC, TRIG, CHOLHDL, LDLDIRECT in the last 72 hours. Thyroid Function Tests: No results for input(s): TSH, T4TOTAL,  FREET4, T3FREE, THYROIDAB in the last 72 hours. Anemia Panel: No results for input(s): VITAMINB12, FOLATE, FERRITIN, TIBC, IRON, RETICCTPCT in the last 72 hours. Urine analysis:    Component Value Date/Time   COLORURINE STRAW (A) 11/08/2019 2117   APPEARANCEUR CLEAR 11/08/2019 2117   LABSPEC 1.024 11/08/2019 2117   LABSPEC 1.015 12/26/2012 1422   PHURINE 8.0 11/08/2019 2117   GLUCOSEU >=500 (A) 11/08/2019 2117   GLUCOSEU > 1000 mg/dL (A) 07/04/2010 2040   HGBUR NEGATIVE 11/08/2019 2117   HGBUR moderate 07/04/2010 1357   BILIRUBINUR NEGATIVE 11/08/2019 2117   BILIRUBINUR Negative 12/26/2012 [place] 11/08/2019 2117   PROTEINUR NEGATIVE 11/08/2019 2117   UROBILINOGEN 0.2 09/05/2019 1044   NITRITE NEGATIVE 11/08/2019 2117   LEUKOCYTESUR NEGATIVE 11/08/2019 2117   LEUKOCYTESUR Negative 12/26/2012 1422   Sepsis Labs: @LABRCNTIP (procalcitonin:4,lacticidven:4) )No results found for this or any previous visit (from the past 240 hour(s)).   Radiological Exams on Admission: Ct Head Wo Contrast  Result Date: 11/08/2019 CLINICAL DATA:  Syncope, fall. EXAM: CT HEAD WITHOUT CONTRAST TECHNIQUE: Contiguous axial images were obtained from the base of the skull through the vertex without intravenous contrast. COMPARISON:  05/25/2016 FINDINGS: Brain: No acute intracranial abnormality. Specifically, no hemorrhage, hydrocephalus, mass lesion, acute infarction, or significant intracranial injury. Vascular: No hyperdense vessel or unexpected calcification. Skull: No acute calvarial abnormality. Sinuses/Orbits: Visualized paranasal sinuses and mastoids clear. Orbital soft tissues unremarkable. Other: None IMPRESSION: Normal study. Electronically Signed   By: Rolm Baptise M.D.   On: 11/08/2019 23:27   Ct Angio Chest Pe W And/or Wo Contrast  Result Date: 11/08/2019 CLINICAL DATA:  Fall while walking from living room to kitchen, history  of seizures, no noted seizure activity,  anticoagulated EXAM: CT ANGIOGRAPHY CHEST CT ABDOMEN AND PELVIS WITH CONTRAST TECHNIQUE: Multidetector CT imaging of the chest was performed using the standard protocol during bolus administration of intravenous contrast. Multiplanar CT image reconstructions and MIPs were obtained to evaluate the vascular anatomy. Multidetector CT imaging of the abdomen and pelvis was performed using the standard protocol during bolus administration of intravenous contrast. CONTRAST:  19mL OMNIPAQUE IOHEXOL 350 MG/ML SOLN COMPARISON:  CT abdomen pelvis 10/20/2017, CTA chest 05/12/2015 FINDINGS: CTA CHEST FINDINGS Cardiovascular: Satisfactory opacification the pulmonary arteries to the segmental level. Stable web-like filling defects are present in branching of the left lower lobar artery. No acute hypoattenuating filling defect is seen. Central pulmonary arteries are normal caliber. Normal caliber of the thoracic aorta. No acute luminal abnormality, periaortic stranding or hemorrhage. Normal 3 vessel branching of the aortic arch. Normal heart size. No pericardial effusion. Mediastinum/Nodes: No enlarged mediastinal, hilar, or axillary lymph nodes. Thyroid gland and thoracic inlet are unremarkable. No acute abnormality of the trachea. Small amount of ingested material within the esophagus. Lungs/Pleura: Numerous pulmonary cysts are similar to prior. Mosaic attenuation within the lungs likely related to imaging during exhalation though a possible air trapping or small airways disease could have a similar appearance. No consolidative opacity. No acute traumatic abnormality of the lung parenchyma. Basilar atelectatic changes are noted. No pneumothorax or effusion. Musculoskeletal: No chest wall abnormality. No acute or significant osseous findings. Prominent vascular channel in the left anterior articular facet of T9 is stable from prior. Review of the MIP images confirms the above findings. CT ABDOMEN and PELVIS FINDINGS  Hepatobiliary: No focal hepatic injury or perihepatic hematoma. Diffuse hepatic hypoattenuation compatible with hepatic steatosis. No focal liver abnormality is seen. Patient is post cholecystectomy. Slight prominence of the biliary tree likely related to reservoir effect. No calcified intraductal gallstones. Pancreas: Unremarkable. No pancreatic ductal dilatation or surrounding inflammatory changes. Spleen: Normal arterial enhancement of the spleen. No splenic injury or perisplenic hematoma. Adrenals/Urinary Tract: No adrenal hematoma or suspicious adrenal lesions. Kidneys enhance and excrete symmetrically. No direct renal injury or perirenal hematoma. No suspicious renal lesions, urolithiasis or hydronephrosis. No acute bladder injury or other abnormality. Stomach/Bowel: Distal esophagus, stomach and duodenal sweep are unremarkable. No small bowel wall thickening or dilatation. No evidence of obstruction. A normal appendix is visualized. No colonic dilatation or wall thickening. Vascular/Lymphatic: The aorta is normal caliber. No suspicious or enlarged lymph nodes in the included lymphatic chains. Reproductive: Uterus is surgically absent. Retained ovaries. 2.9 cm cyst adjacent the left ovary. No other concerning adnexal lesion. Other: No free fluid or free air. No traumatic abdominal injury. No bowel containing hernias. Musculoskeletal: Multilevel degenerative changes are present in the imaged portions of the spine. Additional degenerative changes in the hips. No acute osseous abnormality or suspicious osseous lesion. Few stable bone islands in the left pelvis. Review of the MIP images confirms the above findings. IMPRESSION: 1. No acute traumatic injury of the chest, abdomen or pelvis. 2. Stable web-like filling defects in the branching of the left lower lobar artery, favored to represent chronic pulmonary emboli. 3. No acute pulmonary arterial filling defects are identified. No evidence of right heart strain.  4. Mosaic attenuation of the lungs may reflect imaging during exhalation or underlying air trapping/small airways disease. 5. 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 Consider nonemergent high-resolution chest CT for further evaluation. 6. Post hysterectomy with retained ovaries. 2.9 cm left  adnexal cyst in the left ovary is within physiologic normal for a premenopausal female. 7. Hepatic steatosis. 8. Aortic Atherosclerosis (ICD10-I70.0) Electronically Signed   By: Lovena Le M.D.   On: 11/08/2019 23:42   Ct Cervical Spine Wo Contrast  Result Date: 11/08/2019 CLINICAL DATA:  Syncope, fall EXAM: CT CERVICAL SPINE WITHOUT CONTRAST TECHNIQUE: Multidetector CT imaging of the cervical spine was performed without intravenous contrast. Multiplanar CT image reconstructions were also generated. COMPARISON:  05/18/2012 FINDINGS: Alignment: No subluxation.  Loss of cervical lordosis. Skull base and vertebrae: No acute fracture. No primary bone lesion or focal pathologic process. Soft tissues and spinal canal: No prevertebral fluid or swelling. No visible canal hematoma. Disc levels: Early spurring. Ossification of the posterior longitudinal ligament. Upper chest: Negative Other: None IMPRESSION: No acute bony abnormality. Electronically Signed   By: Rolm Baptise M.D.   On: 11/08/2019 23:28   Ct Abdomen Pelvis W Contrast  Result Date: 11/08/2019 CLINICAL DATA:  Fall while walking from living room to kitchen, history of seizures, no noted seizure activity, anticoagulated EXAM: CT ANGIOGRAPHY CHEST CT ABDOMEN AND PELVIS WITH CONTRAST TECHNIQUE: Multidetector CT imaging of the chest was performed using the standard protocol during bolus administration of intravenous contrast. Multiplanar CT image reconstructions and MIPs were obtained to evaluate the vascular anatomy. Multidetector CT imaging of the abdomen and pelvis was performed using the  standard protocol during bolus administration of intravenous contrast. CONTRAST:  169mL OMNIPAQUE IOHEXOL 350 MG/ML SOLN COMPARISON:  CT abdomen pelvis 10/20/2017, CTA chest 05/12/2015 FINDINGS: CTA CHEST FINDINGS Cardiovascular: Satisfactory opacification the pulmonary arteries to the segmental level. Stable web-like filling defects are present in branching of the left lower lobar artery. No acute hypoattenuating filling defect is seen. Central pulmonary arteries are normal caliber. Normal caliber of the thoracic aorta. No acute luminal abnormality, periaortic stranding or hemorrhage. Normal 3 vessel branching of the aortic arch. Normal heart size. No pericardial effusion. Mediastinum/Nodes: No enlarged mediastinal, hilar, or axillary lymph nodes. Thyroid gland and thoracic inlet are unremarkable. No acute abnormality of the trachea. Small amount of ingested material within the esophagus. Lungs/Pleura: Numerous pulmonary cysts are similar to prior. Mosaic attenuation within the lungs likely related to imaging during exhalation though a possible air trapping or small airways disease could have a similar appearance. No consolidative opacity. No acute traumatic abnormality of the lung parenchyma. Basilar atelectatic changes are noted. No pneumothorax or effusion. Musculoskeletal: No chest wall abnormality. No acute or significant osseous findings. Prominent vascular channel in the left anterior articular facet of T9 is stable from prior. Review of the MIP images confirms the above findings. CT ABDOMEN and PELVIS FINDINGS Hepatobiliary: No focal hepatic injury or perihepatic hematoma. Diffuse hepatic hypoattenuation compatible with hepatic steatosis. No focal liver abnormality is seen. Patient is post cholecystectomy. Slight prominence of the biliary tree likely related to reservoir effect. No calcified intraductal gallstones. Pancreas: Unremarkable. No pancreatic ductal dilatation or surrounding inflammatory changes.  Spleen: Normal arterial enhancement of the spleen. No splenic injury or perisplenic hematoma. Adrenals/Urinary Tract: No adrenal hematoma or suspicious adrenal lesions. Kidneys enhance and excrete symmetrically. No direct renal injury or perirenal hematoma. No suspicious renal lesions, urolithiasis or hydronephrosis. No acute bladder injury or other abnormality. Stomach/Bowel: Distal esophagus, stomach and duodenal sweep are unremarkable. No small bowel wall thickening or dilatation. No evidence of obstruction. A normal appendix is visualized. No colonic dilatation or wall thickening. Vascular/Lymphatic: The aorta is normal caliber. No suspicious or enlarged lymph nodes in the included lymphatic  chains. Reproductive: Uterus is surgically absent. Retained ovaries. 2.9 cm cyst adjacent the left ovary. No other concerning adnexal lesion. Other: No free fluid or free air. No traumatic abdominal injury. No bowel containing hernias. Musculoskeletal: Multilevel degenerative changes are present in the imaged portions of the spine. Additional degenerative changes in the hips. No acute osseous abnormality or suspicious osseous lesion. Few stable bone islands in the left pelvis. Review of the MIP images confirms the above findings. IMPRESSION: 1. No acute traumatic injury of the chest, abdomen or pelvis. 2. Stable web-like filling defects in the branching of the left lower lobar artery, favored to represent chronic pulmonary emboli. 3. No acute pulmonary arterial filling defects are identified. No evidence of right heart strain. 4. Mosaic attenuation of the lungs may reflect imaging during exhalation or underlying air trapping/small airways disease. 5. 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 Consider nonemergent high-resolution chest CT for further evaluation. 6. Post hysterectomy with retained ovaries. 2.9 cm left adnexal cyst in the left ovary is within  physiologic normal for a premenopausal female. 7. Hepatic steatosis. 8. Aortic Atherosclerosis (ICD10-I70.0) Electronically Signed   By: Lovena Le M.D.   On: 11/08/2019 23:42   Dg Pelvis Portable  Result Date: 11/08/2019 CLINICAL DATA:  Fall EXAM: PORTABLE PELVIS 1-2 VIEWS COMPARISON:  Radiograph 01/02/2008 FINDINGS: Appearance worrisome for a minimally impacted subcapital left femoral neck fracture. Femoral heads remain normally located. No other acute pelvic fracture or traumatic diastasis. Degenerative changes noted in the SI joints and both hips. Included portions of the lumbar spine are unremarkable aside from mild discogenic change. Bowel gas pattern is normal. Soft tissues are unremarkable. IMPRESSION: Findings worrisome for a minimally impacted subcapital left femoral neck fracture. Electronically Signed   By: Lovena Le M.D.   On: 11/08/2019 21:41   Ct L-spine No Charge  Result Date: 11/08/2019 CLINICAL DATA:  Fall EXAM: CT LUMBAR SPINE WITHOUT CONTRAST TECHNIQUE: Multiplanar CT reconstructions of the lumbar spine were performed from concurrent CT of the chest, abdomen and pelvis. COMPARISON:  CT abdomen pelvis 10/20/2017, CT L-spine 02/03/2013 FINDINGS: Segmentation: 5 lumbar type vertebrae. Alignment: Preservation of the normal lumbar lordosis. Mild grade 1 anterolisthesis L5 on S1 is stable from comparison in 2014. Posterior elements are normally aligned without abnormal facet widening. Vertebrae: No acute vertebral body fracture, height loss other focal pathologic process. Posterior elements are intact. Included portions of the sacrum and pelvis are unremarkable aside from vacuum phenomenon in both SI joints and mild SI joint widening, similar to comparison. Paraspinal and other soft tissues: No paravertebral fluid or swelling. Mild posterior body wall edema. For findings in the abdomen and pelvis, please see dedicated CT from which this study is generated. Disc levels: Minimal posterior  disc bulging at L4-5 and L5-S1 without significant canal stenosis. Posterior facet hypertrophic changes are most pronounced at the same levels with resulting mild bilateral foraminal narrowing at L4-5 and moderate bilateral neural foraminal narrowing at L5-S1. IMPRESSION: 1. No acute fracture or subluxation of the lumbar spine. 2. Mild grade 1 anterolisthesis L5 on S1 is stable from comparison in 2014. 3. Posterior facet hypertrophic changes at L4-5 and L5-S1 resulting in mild bilateral foraminal narrowing at L4-5 and moderate bilateral neural foraminal narrowing at L5-S1. 4. For findings in the abdomen and pelvis, please see dedicated CT from which this study is generated. Electronically Signed   By: Lovena Le M.D.   On: 11/08/2019 23:51   Dg Chest Portable  1 View  Result Date: 11/08/2019 CLINICAL DATA:  Fall, chest pain EXAM: PORTABLE CHEST 1 VIEW COMPARISON:  02/27/2018 FINDINGS: Heart and mediastinal contours are within normal limits. No focal opacities or effusions. No acute bony abnormality. IMPRESSION: No active disease. Electronically Signed   By: Rolm Baptise M.D.   On: 11/08/2019 21:40    EKG: Independently reviewed. Sinus tachycardia (rate 113), PVC.   Assessment/Plan   1. Transient loss of consciousness  - Patient reports being in usual state and having uneventful day when she had sudden LOC without prodrome and without urinary incontinence that she usually has with her seizures  - There was no seizure-like activity seen by family, no tongue bite, but it apparently took several minutes for her to regain awareness  - Family reports that she has hx of recurrent similar episodes years ago that prompted extensive evaluation including loop recorder and were ultimately attributed to seizures  - EKG with sinus tachycardia and PVC; CTA chest negative for acute PE or heart-strain  - Continue cardiac monitoring, check orthostatic vitals, check echocardiogram, EEG    2. Seizure disorder  -  Presents after a transient LOC, did not have any shaking or incontinence with this, reports her last seizure was ~6 months ago, and reports adherence with her antiepileptics  - Continue antiepileptics, check EEG given unclear nature of her transient LOC    3. Hypertensive urgency  - SBP was 214 in ED, improved with treatment of pain  - She had not taken her antihypertensives yet prior to coming in  - Continue pain-control and continue Norvasc, chlorthalidone, and lisinopril with doses now   4. Right leg weakness and numbness  - Presents after transient LOC with fall and reports right-sided back pain with new right leg numbness and weakness  - Exam has been inconsistent in ED and no acute findings were seen on CT  - MRI T- and L-spine ordered from ED and not yet performed  - Follow-up MRI, continue supportive care   5. Chronic PE  - Continue Xarelto    6. Type II DM  - A1c was 8.2% in 2019  - Serum glucose is 390 in ED  - Managed with Trulicity at home, held on admission  - Check CBG's and use SSI with Novolog for now     7. Asthma   - No cough, wheeze, or SOB on admission  - Continue ICS/LABA, Singulair, and as-needed albuterol     PPE: mask, face shield  DVT prophylaxis: Xarelto  Code Status: Full  Family Communication: Husband updated at bedside  Consults called: None  Admission status: observation     [name], MD [facility] Pager [phone]  If 7PM-7AM, please contact night-coverage [email or web address] Password [email or web address]  11/09/2019, 1:19 AM

## 2019-11-09 NOTE — Progress Notes (Signed)
BP is 89/61 after 574mL bolus. Messaged NP on call to make aware.    Eleanora Neighbor, RN

## 2019-11-09 NOTE — ED Notes (Signed)
Patient transported to MRI 

## 2019-11-09 NOTE — Progress Notes (Signed)
BP still very low. MRI called wanting to take pt, however pt unable to go off of fluids and RN cannot go down with pt at this time. RN informed MRI of this and was told to just call back when pt can come.   Eleanora Neighbor, RN

## 2019-11-09 NOTE — ED Notes (Addendum)
ED TO INPATIENT HANDOFF REPORT  ED Nurse Name and Phone #: Reuel Derby Name/Age/Gender Makayla Frazier 48 y.o. female Room/Bed: 013C/013C  Code Status   Code Status: Prior  Home/SNF/Other Home Patient oriented to: self, place, time and situation Is this baseline? Yes   Triage Complete: Triage complete  Chief Complaint Fall; [facility]  Triage Note Pt BIB [facility] for eval of fall/collapse while walking from living room to kitchen. Pt w/ hx of seizures, family did not report any noted shaking activity. Pt does not recall fall, pt is anticoagulated on xarelto, unsure if struck head./ GCS 15 on arrival #20G L AC PIV in place on arrival. Syncope vs seizure. Pt c/o R sided back/flank pain 8/10.    Allergies Allergies  Allergen Reactions  . Silver Other (See Comments) and Rash  . Codeine Nausea And Vomiting    Pt takes Percocet without problems   . Darvocet [Propoxyphene N-Acetaminophen] Nausea And Vomiting  . Hydrocodone-Acetaminophen Nausea And Vomiting  . Hydrocodone-Acetaminophen Nausea And Vomiting    Vomiting  . Latex Rash  . Tape Itching and Rash  . Tramadol Nausea Only    Level of Care/Admitting Diagnosis ED Disposition    ED Disposition Condition [facility] Area: [place] [[id number]]  Level of Care: Telemetry Medical [104]  I expect the patient will be discharged within 24 hours: Yes  LOW acuity---Tx typically complete <24 hrs---ACUTE conditions typically can be evaluated <24 hours---LABS likely to return to acceptable levels <24 hours---IS near functional baseline---EXPECTED to return to current living arrangement---NOT newly hypoxic: Does not meet criteria for 5C-Observation unit  Covid Evaluation: Asymptomatic Screening Protocol (No Symptoms)  Diagnosis: Transient loss of consciousness [390290]  Admitting Physician: [name] [[id number]]  Attending Physician: [name] [[id number]]  PT Class (Do Not Modify): Observation [104]  PT  Acc Code (Do Not Modify): Observation [10022]       B Medical/Surgery History Past Medical History:  Diagnosis Date  . Anemia   . Arthritis    Left leg  . Asthma   . BV (bacterial vaginosis) 2012  . Complication of anesthesia    Seizures in PACU after surgery 3/15  . DM (diabetes mellitus) ([place])    diagnosed 2009  . DVT (deep venous thrombosis) ([place]) 2011   pt had IUD; also 05/2012  . Dysfunctional uterine bleeding    Seen by Dr. Leo Grosser, GYN; pending hysterectomy as of 07/2012  . H/O hypercoagulable state    2/2 contraception  . Headache(784.0)    migraines  . Herpes simplex without mention of complication 12/6071   HSV-2  . HLD (hyperlipidemia)   . Hypertension   . Infection    UTI  . Labial lesion 2013  . Major depression    Hx suicide attempt in 2009, [facility] admissions  . Mastodynia   . Neuromuscular disorder (HCC)    neuropathy  . Neuropathy   . Obesity   . Oligomenorrhea 2011  . PE (pulmonary embolism)    2009 - on oral contraception; multiple  . Post - coital bleeding 2012  . Seizures ([place]) 2015   last episode May 2015  . Sleep apnea   . SORE THROAT 08/10/2010   Qualifier: Diagnosis of  By: Vanessa Kick MD, Saralyn Pilar    . Status post placement of implantable loop recorder   . Stroke [facility])    TIA 2013  . Syncopal episodes    unknown etiology  . Thyroid disease  hypothyroidism (pt denies)  . Vaginal Pap smear, abnormal   . Venous insufficiency   . Vitamin D deficiency    unknown to pt.   Past Surgical History:  Procedure Laterality Date  . BILATERAL SALPINGECTOMY  12/18/2012   Procedure: BILATERAL SALPINGECTOMY;  Surgeon: Eldred Manges, MD;  Location: [place] [facility];  Service: Gynecology;  Laterality: Bilateral;  . BLADDER SUSPENSION N/A 02/13/2014   Procedure: TRANSVAGINAL TAPE (TVT) PROCEDURE;  Surgeon: Delice Lesch, MD;  Location: [facility];  Service: Gynecology;  Laterality: N/A;  . BREAST REDUCTION SURGERY     Dr. Eugene Garnet [facility] 2011  . CARDIAC  ELECTROPHYSIOLOGY STUDY & DFT     Implantable Loop recorder; no arrhythmias associated with synocpal spells; loop explanted 07-2013  . CHOLECYSTECTOMY    . DILATATION & CURRETTAGE/HYSTEROSCOPY WITH RESECTOCOPE  2007 & 2013  . ENDOMETRIAL BIOPSY  2011  . IUD REMOVAL  12/18/2012   Procedure: INTRAUTERINE DEVICE (IUD) REMOVAL;  Surgeon: Eldred Manges, MD;  Location: [facility];  Service: Gynecology;  Laterality: N/A;  Removed during prep by E. Florene Glen PA  . LAPAROSCOPIC ASSISTED VAGINAL HYSTERECTOMY  12/18/2012   Procedure: LAPAROSCOPIC ASSISTED VAGINAL HYSTERECTOMY;  Surgeon: Eldred Manges, MD;  Location: [place] [facility];  Service: Gynecology;  Laterality: N/A;  . LOOP RECORDER EXPLANT N/A 07/17/2013   Procedure: LOOP RECORDER EXPLANT;  Surgeon: Thompson Grayer, MD;  Location: [facility] CATH LAB;  Service: Cardiovascular;  Laterality: N/A;  . TRANSVAGINAL TAPE (TVT) REMOVAL N/A 04/28/2014   Procedure: Removal of suburethral mesh;  Surgeon: Delice Lesch, MD;  Location: [facility];  Service: Gynecology;  Laterality: N/A;  . WISDOM TOOTH EXTRACTION       A IV Location/Drains/Wounds Patient Lines/Drains/Airways Status   Active Line/Drains/Airways    Name:   Placement date:   Placement time:   Site:   Days:   Peripheral IV 11/08/19 Left Antecubital   11/08/19    -    Antecubital   1          Intake/Output Last 24 hours No intake or output data in the 24 hours ending 11/09/19 0907  Labs/Imaging Results for orders placed or performed during the hospital encounter of 11/08/19 (from the past 48 hour(s))  CBG monitoring, ED     Status: Abnormal   Collection Time: 11/08/19  8:12 PM  Result Value Ref Range   Glucose-Capillary 363 (H) 70 - 99 mg/dL  Comprehensive metabolic panel     Status: Abnormal   Collection Time: 11/08/19  9:16 PM  Result Value Ref Range   Sodium 133 (L) 135 - 145 mmol/L   Potassium 4.0 3.5 - 5.1 mmol/L   Chloride 95 (L) 98 - 111 mmol/L   CO2 26 22 - 32 mmol/L   Glucose, Bld 390 (H) 70 - 99  mg/dL   BUN 9 6 - 20 mg/dL   Creatinine, Ser 0.82 0.44 - 1.00 mg/dL   Calcium 9.7 8.9 - 10.3 mg/dL   Total Protein 8.2 (H) 6.5 - 8.1 g/dL   Albumin 3.8 3.5 - 5.0 g/dL   AST 36 15 - 41 U/L   ALT 44 0 - 44 U/L   Alkaline Phosphatase 64 38 - 126 U/L   Total Bilirubin 0.5 0.3 - 1.2 mg/dL   GFR calc non Af Amer >60 >60 mL/min   GFR calc Af Amer >60 >60 mL/min   Anion gap 12 5 - 15    Comment: Performed at [facility], [address]  [place], [place] [address]  Ethanol     Status: None   Collection Time: 11/08/19  9:16 PM  Result Value Ref Range   Alcohol, Ethyl (B) <10 <10 mg/dL    Comment: (NOTE) Lowest detectable limit for serum alcohol is 10 mg/dL. For medical purposes only. Performed at [facility], [place] [address] [place], [place] [address]   Troponin I (High Sensitivity)     Status: None   Collection Time: 11/08/19  9:16 PM  Result Value Ref Range   Troponin I (High Sensitivity) 5 <18 ng/L    Comment: (NOTE) Elevated high sensitivity troponin I (hsTnI) values and significant  changes across serial measurements may suggest ACS but many other  chronic and acute conditions are known to elevate hsTnI results.  Refer to the "Links" section for chest pain algorithms and additional  guidance. Performed at [facility], [place] [address], [place], [place] [address]   CBC with Differential     Status: Abnormal   Collection Time: 11/08/19  9:16 PM  Result Value Ref Range   WBC 10.0 4.0 - 10.5 K/uL   RBC 5.36 (H) 3.87 - 5.11 MIL/uL   Hemoglobin 14.0 12.0 - 15.0 g/dL   HCT 42.5 36.0 - 46.0 %   MCV 79.3 (L) 80.0 - 100.0 fL   MCH 26.1 26.0 - 34.0 pg   MCHC 32.9 30.0 - 36.0 g/dL   RDW 14.2 11.5 - 15.5 %   Platelets 314 150 - 400 K/uL   nRBC 0.0 0.0 - 0.2 %   Neutrophils Relative % 44 %   Neutro Abs 4.4 1.7 - 7.7 K/uL   Lymphocytes Relative 48 %   Lymphs Abs 4.8 (H) 0.7 - 4.0 K/uL   Monocytes Relative 6 %   Monocytes Absolute 0.6 0.1 - 1.0 K/uL   Eosinophils  Relative 1 %   Eosinophils Absolute 0.1 0.0 - 0.5 K/uL   Basophils Relative 1 %   Basophils Absolute 0.1 0.0 - 0.1 K/uL   Immature Granulocytes 0 %   Abs Immature Granulocytes 0.01 0.00 - 0.07 K/uL    Comment: Performed at [facility] [address], [place], [place] [address]  Urinalysis, Routine w reflex microscopic     Status: Abnormal   Collection Time: 11/08/19  9:17 PM  Result Value Ref Range   Color, Urine STRAW (A) YELLOW   APPearance CLEAR CLEAR   Specific Gravity, Urine 1.024 1.005 - 1.030   pH 8.0 5.0 - 8.0   Glucose, UA >=500 (A) NEGATIVE mg/dL   Hgb urine dipstick NEGATIVE NEGATIVE   Bilirubin Urine NEGATIVE NEGATIVE   Ketones, ur NEGATIVE NEGATIVE mg/dL   Protein, ur NEGATIVE NEGATIVE mg/dL   Nitrite NEGATIVE NEGATIVE   Leukocytes,Ua NEGATIVE NEGATIVE   RBC / HPF 0-5 0 - 5 RBC/hpf   WBC, UA 0-5 0 - 5 WBC/hpf   Bacteria, UA RARE (A) NONE SEEN   Squamous Epithelial / LPF 0-5 0 - 5    Comment: Performed at [facility] [address], [place], [place] [address]  Urine rapid drug screen (hosp performed)     Status: None   Collection Time: 11/08/19  9:17 PM  Result Value Ref Range   Opiates NONE DETECTED NONE DETECTED   Cocaine NONE DETECTED NONE DETECTED   Benzodiazepines NONE DETECTED NONE DETECTED   Amphetamines NONE DETECTED NONE DETECTED   Tetrahydrocannabinol NONE DETECTED NONE DETECTED   Barbiturates NONE DETECTED NONE DETECTED  Comment: (NOTE) DRUG SCREEN FOR MEDICAL PURPOSES ONLY.  IF CONFIRMATION IS NEEDED FOR ANY PURPOSE, NOTIFY LAB WITHIN 5 DAYS. LOWEST DETECTABLE LIMITS FOR URINE DRUG SCREEN Drug Class                     Cutoff (ng/mL) Amphetamine and metabolites    1000 Barbiturate and metabolites    200 Benzodiazepine                 433 Tricyclics and metabolites     300 Opiates and metabolites        300 Cocaine and metabolites        300 THC                            50 Performed at [facility], [place] [address], [place], [place] [address]   POC SARS Coronavirus 2 Ag-ED - Nasal Swab (BD Veritor Kit)     Status: None   Collection Time: 11/08/19 10:48 PM  Result Value Ref Range   SARS Coronavirus 2 Ag NEGATIVE NEGATIVE    Comment: (NOTE) SARS-CoV-2 antigen NOT DETECTED.  Negative results are presumptive.  Negative results do not preclude SARS-CoV-2 infection and should not be used as the sole basis for treatment or other patient management decisions, including infection  control decisions, particularly in the presence of clinical signs and  symptoms consistent with COVID-19, or in those who have been in contact with the virus.  Negative results must be combined with clinical observations, patient history, and epidemiological information. The expected result is Negative. Fact Sheet for Patients: PodPark.tn Fact Sheet for Healthcare Providers: GiftContent.is This test is not yet approved or cleared by the [place] FDA and  has been authorized for detection and/or diagnosis of SARS-CoV-2 by FDA under an Emergency Use Authorization (EUA).  This EUA will remain in effect (meaning this test can be used) for the duration of  the COVID-19 de claration under Section 564(b)(1) of the Act, 21 U.S.C. section 360bbb-3(b)(1), unless the authorization is terminated or revoked sooner.   Troponin I (High Sensitivity)     Status: None   Collection Time: 11/08/19 11:17 PM  Result Value Ref Range   Troponin I (High Sensitivity) 12 <18 ng/L    Comment: (NOTE) Elevated high sensitivity troponin I (hsTnI) values and significant  changes across serial measurements may suggest ACS but many other  chronic and acute conditions are known to elevate hsTnI results.  Refer to the "Links" section for chest pain algorithms and additional  guidance. Performed at [facility], [place] [address] [place], [place] [address]   CBG monitoring, ED     Status:  Abnormal   Collection Time: 11/09/19  1:57 AM  Result Value Ref Range   Glucose-Capillary 254 (H) 70 - 99 mg/dL  CBC     Status: Abnormal   Collection Time: 11/09/19  6:03 AM  Result Value Ref Range   WBC 10.7 (H) 4.0 - 10.5 K/uL   RBC 5.03 3.87 - 5.11 MIL/uL   Hemoglobin 12.8 12.0 - 15.0 g/dL   HCT 40.5 36.0 - 46.0 %   MCV 80.5 80.0 - 100.0 fL   MCH 25.4 (L) 26.0 - 34.0 pg   MCHC 31.6 30.0 - 36.0 g/dL   RDW 14.5 11.5 - 15.5 %   Platelets 312 150 - 400 K/uL   nRBC 0.0 0.0 - 0.2 %  Comment: Performed at [facility], [place] [address] [place], [place] [address]  Basic metabolic panel     Status: Abnormal   Collection Time: 11/09/19  6:03 AM  Result Value Ref Range   Sodium 135 135 - 145 mmol/L   Potassium 3.9 3.5 - 5.1 mmol/L   Chloride 97 (L) 98 - 111 mmol/L   CO2 28 22 - 32 mmol/L   Glucose, Bld 290 (H) 70 - 99 mg/dL   BUN 8 6 - 20 mg/dL   Creatinine, Ser 0.72 0.44 - 1.00 mg/dL   Calcium 9.3 8.9 - 10.3 mg/dL   GFR calc non Af Amer >60 >60 mL/min   GFR calc Af Amer >60 >60 mL/min   Anion gap 10 5 - 15    Comment: Performed at [facility], [place] [address], [place], [place] [address]  CBG monitoring, ED     Status: Abnormal   Collection Time: 11/09/19  8:03 AM  Result Value Ref Range   Glucose-Capillary 325 (H) 70 - 99 mg/dL   Comment 1 Notify RN    Comment 2 Document in Chart    Ct Head Wo Contrast  Result Date: 11/08/2019 CLINICAL DATA:  Syncope, fall. EXAM: CT HEAD WITHOUT CONTRAST TECHNIQUE: Contiguous axial images were obtained from the base of the skull through the vertex without intravenous contrast. COMPARISON:  05/25/2016 FINDINGS: Brain: No acute intracranial abnormality. Specifically, no hemorrhage, hydrocephalus, mass lesion, acute infarction, or significant intracranial injury. Vascular: No hyperdense vessel or unexpected calcification. Skull: No acute calvarial abnormality. Sinuses/Orbits: Visualized paranasal sinuses and mastoids clear. Orbital soft  tissues unremarkable. Other: None IMPRESSION: Normal study. Electronically Signed   By: Rolm Baptise M.D.   On: 11/08/2019 23:27   Ct Angio Chest Pe W And/or Wo Contrast  Result Date: 11/08/2019 CLINICAL DATA:  Fall while walking from living room to kitchen, history of seizures, no noted seizure activity, anticoagulated EXAM: CT ANGIOGRAPHY CHEST CT ABDOMEN AND PELVIS WITH CONTRAST TECHNIQUE: Multidetector CT imaging of the chest was performed using the standard protocol during bolus administration of intravenous contrast. Multiplanar CT image reconstructions and MIPs were obtained to evaluate the vascular anatomy. Multidetector CT imaging of the abdomen and pelvis was performed using the standard protocol during bolus administration of intravenous contrast. CONTRAST:  19m OMNIPAQUE IOHEXOL 350 MG/ML SOLN COMPARISON:  CT abdomen pelvis 10/20/2017, CTA chest 05/12/2015 FINDINGS: CTA CHEST FINDINGS Cardiovascular: Satisfactory opacification the pulmonary arteries to the segmental level. Stable web-like filling defects are present in branching of the left lower lobar artery. No acute hypoattenuating filling defect is seen. Central pulmonary arteries are normal caliber. Normal caliber of the thoracic aorta. No acute luminal abnormality, periaortic stranding or hemorrhage. Normal 3 vessel branching of the aortic arch. Normal heart size. No pericardial effusion. Mediastinum/Nodes: No enlarged mediastinal, hilar, or axillary lymph nodes. Thyroid gland and thoracic inlet are unremarkable. No acute abnormality of the trachea. Small amount of ingested material within the esophagus. Lungs/Pleura: Numerous pulmonary cysts are similar to prior. Mosaic attenuation within the lungs likely related to imaging during exhalation though a possible air trapping or small airways disease could have a similar appearance. No consolidative opacity. No acute traumatic abnormality of the lung parenchyma. Basilar atelectatic changes are  noted. No pneumothorax or effusion. Musculoskeletal: No chest wall abnormality. No acute or significant osseous findings. Prominent vascular channel in the left anterior articular facet of T9 is stable from prior. Review of the MIP images confirms the above findings. CT ABDOMEN  and PELVIS FINDINGS Hepatobiliary: No focal hepatic injury or perihepatic hematoma. Diffuse hepatic hypoattenuation compatible with hepatic steatosis. No focal liver abnormality is seen. Patient is post cholecystectomy. Slight prominence of the biliary tree likely related to reservoir effect. No calcified intraductal gallstones. Pancreas: Unremarkable. No pancreatic ductal dilatation or surrounding inflammatory changes. Spleen: Normal arterial enhancement of the spleen. No splenic injury or perisplenic hematoma. Adrenals/Urinary Tract: No adrenal hematoma or suspicious adrenal lesions. Kidneys enhance and excrete symmetrically. No direct renal injury or perirenal hematoma. No suspicious renal lesions, urolithiasis or hydronephrosis. No acute bladder injury or other abnormality. Stomach/Bowel: Distal esophagus, stomach and duodenal sweep are unremarkable. No small bowel wall thickening or dilatation. No evidence of obstruction. A normal appendix is visualized. No colonic dilatation or wall thickening. Vascular/Lymphatic: The aorta is normal caliber. No suspicious or enlarged lymph nodes in the included lymphatic chains. Reproductive: Uterus is surgically absent. Retained ovaries. 2.9 cm cyst adjacent the left ovary. No other concerning adnexal lesion. Other: No free fluid or free air. No traumatic abdominal injury. No bowel containing hernias. Musculoskeletal: Multilevel degenerative changes are present in the imaged portions of the spine. Additional degenerative changes in the hips. No acute osseous abnormality or suspicious osseous lesion. Few stable bone islands in the left pelvis. Review of the MIP images confirms the above findings.  IMPRESSION: 1. No acute traumatic injury of the chest, abdomen or pelvis. 2. Stable web-like filling defects in the branching of the left lower lobar artery, favored to represent chronic pulmonary emboli. 3. No acute pulmonary arterial filling defects are identified. No evidence of right heart strain. 4. Mosaic attenuation of the lungs may reflect imaging during exhalation or underlying air trapping/small airways disease. 5. 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 Consider nonemergent high-resolution chest CT for further evaluation. 6. Post hysterectomy with retained ovaries. 2.9 cm left adnexal cyst in the left ovary is within physiologic normal for a premenopausal female. 7. Hepatic steatosis. 8. Aortic Atherosclerosis (ICD10-I70.0) Electronically Signed   By: Lovena Le M.D.   On: 11/08/2019 23:42   Ct Cervical Spine Wo Contrast  Result Date: 11/08/2019 CLINICAL DATA:  Syncope, fall EXAM: CT CERVICAL SPINE WITHOUT CONTRAST TECHNIQUE: Multidetector CT imaging of the cervical spine was performed without intravenous contrast. Multiplanar CT image reconstructions were also generated. COMPARISON:  05/18/2012 FINDINGS: Alignment: No subluxation.  Loss of cervical lordosis. Skull base and vertebrae: No acute fracture. No primary bone lesion or focal pathologic process. Soft tissues and spinal canal: No prevertebral fluid or swelling. No visible canal hematoma. Disc levels: Early spurring. Ossification of the posterior longitudinal ligament. Upper chest: Negative Other: None IMPRESSION: No acute bony abnormality. Electronically Signed   By: Rolm Baptise M.D.   On: 11/08/2019 23:28   Mr Thoracic Spine Wo Contrast  Result Date: 11/09/2019 CLINICAL DATA:  Back pain with right lower extremity numbness and weakness. EXAM: MRI THORACIC AND LUMBAR SPINE WITHOUT CONTRAST TECHNIQUE: Multiplanar and multiecho pulse sequences of the thoracic and  lumbar spine were obtained without intravenous contrast. COMPARISON:  CT lumbar spine 11/08/2019 FINDINGS: MRI THORACIC SPINE FINDINGS Alignment:  Physiologic. Vertebrae: No fracture, evidence of discitis, or bone lesion. Cord:  Normal signal and morphology. Paraspinal and other soft tissues: Negative. Disc levels: C7-T1: Small central disc protrusion indenting the ventral spinal cord. Otherwise, the thoracic disc levels are normal. MRI LUMBAR SPINE FINDINGS Segmentation:  Stand Alignment:  Normal Vertebrae:  Normal Conus medullaris and cauda equina: Conus extends to the  L1 level. Conus and cauda equina appear normal. Paraspinal and other soft tissues: Markedly distended urinary bladder. Cystic focus in the left lower quadrant. Disc levels: L1-2: Normal L2-3: Mild facet hypertrophy. Normal disc. No stenosis. L3-4: Mild facet hypertrophy. Normal disc. No stenosis. L4-5: Moderate facet hypertrophy, right worse than left. Normal disc. No spinal canal or neural foraminal stenosis. L5-S1: Moderate facet hypertrophy. No disc herniation or stenosis. IMPRESSION: 1. No thoracic or lumbar spinal canal or neural foraminal stenosis. 2. Small central disc protrusion at C7-T1 indenting the ventral spinal cord. No associated spinal canal stenosis. 3. Moderate lower lumbar facet arthrosis, which may be a source of local low back pain. Electronically Signed   By: Ulyses Jarred M.D.   On: 11/09/2019 04:17   Mr Lumbar Spine Wo Contrast  Result Date: 11/09/2019 CLINICAL DATA:  Back pain with right lower extremity numbness and weakness. EXAM: MRI THORACIC AND LUMBAR SPINE WITHOUT CONTRAST TECHNIQUE: Multiplanar and multiecho pulse sequences of the thoracic and lumbar spine were obtained without intravenous contrast. COMPARISON:  CT lumbar spine 11/08/2019 FINDINGS: MRI THORACIC SPINE FINDINGS Alignment:  Physiologic. Vertebrae: No fracture, evidence of discitis, or bone lesion. Cord:  Normal signal and morphology. Paraspinal and  other soft tissues: Negative. Disc levels: C7-T1: Small central disc protrusion indenting the ventral spinal cord. Otherwise, the thoracic disc levels are normal. MRI LUMBAR SPINE FINDINGS Segmentation:  Stand Alignment:  Normal Vertebrae:  Normal Conus medullaris and cauda equina: Conus extends to the L1 level. Conus and cauda equina appear normal. Paraspinal and other soft tissues: Markedly distended urinary bladder. Cystic focus in the left lower quadrant. Disc levels: L1-2: Normal L2-3: Mild facet hypertrophy. Normal disc. No stenosis. L3-4: Mild facet hypertrophy. Normal disc. No stenosis. L4-5: Moderate facet hypertrophy, right worse than left. Normal disc. No spinal canal or neural foraminal stenosis. L5-S1: Moderate facet hypertrophy. No disc herniation or stenosis. IMPRESSION: 1. No thoracic or lumbar spinal canal or neural foraminal stenosis. 2. Small central disc protrusion at C7-T1 indenting the ventral spinal cord. No associated spinal canal stenosis. 3. Moderate lower lumbar facet arthrosis, which may be a source of local low back pain. Electronically Signed   By: Ulyses Jarred M.D.   On: 11/09/2019 04:17   Ct Abdomen Pelvis W Contrast  Result Date: 11/08/2019 CLINICAL DATA:  Fall while walking from living room to kitchen, history of seizures, no noted seizure activity, anticoagulated EXAM: CT ANGIOGRAPHY CHEST CT ABDOMEN AND PELVIS WITH CONTRAST TECHNIQUE: Multidetector CT imaging of the chest was performed using the standard protocol during bolus administration of intravenous contrast. Multiplanar CT image reconstructions and MIPs were obtained to evaluate the vascular anatomy. Multidetector CT imaging of the abdomen and pelvis was performed using the standard protocol during bolus administration of intravenous contrast. CONTRAST:  180m OMNIPAQUE IOHEXOL 350 MG/ML SOLN COMPARISON:  CT abdomen pelvis 10/20/2017, CTA chest 05/12/2015 FINDINGS: CTA CHEST FINDINGS Cardiovascular: Satisfactory  opacification the pulmonary arteries to the segmental level. Stable web-like filling defects are present in branching of the left lower lobar artery. No acute hypoattenuating filling defect is seen. Central pulmonary arteries are normal caliber. Normal caliber of the thoracic aorta. No acute luminal abnormality, periaortic stranding or hemorrhage. Normal 3 vessel branching of the aortic arch. Normal heart size. No pericardial effusion. Mediastinum/Nodes: No enlarged mediastinal, hilar, or axillary lymph nodes. Thyroid gland and thoracic inlet are unremarkable. No acute abnormality of the trachea. Small amount of ingested material within the esophagus. Lungs/Pleura: Numerous pulmonary cysts are similar to prior.  Mosaic attenuation within the lungs likely related to imaging during exhalation though a possible air trapping or small airways disease could have a similar appearance. No consolidative opacity. No acute traumatic abnormality of the lung parenchyma. Basilar atelectatic changes are noted. No pneumothorax or effusion. Musculoskeletal: No chest wall abnormality. No acute or significant osseous findings. Prominent vascular channel in the left anterior articular facet of T9 is stable from prior. Review of the MIP images confirms the above findings. CT ABDOMEN and PELVIS FINDINGS Hepatobiliary: No focal hepatic injury or perihepatic hematoma. Diffuse hepatic hypoattenuation compatible with hepatic steatosis. No focal liver abnormality is seen. Patient is post cholecystectomy. Slight prominence of the biliary tree likely related to reservoir effect. No calcified intraductal gallstones. Pancreas: Unremarkable. No pancreatic ductal dilatation or surrounding inflammatory changes. Spleen: Normal arterial enhancement of the spleen. No splenic injury or perisplenic hematoma. Adrenals/Urinary Tract: No adrenal hematoma or suspicious adrenal lesions. Kidneys enhance and excrete symmetrically. No direct renal injury or  perirenal hematoma. No suspicious renal lesions, urolithiasis or hydronephrosis. No acute bladder injury or other abnormality. Stomach/Bowel: Distal esophagus, stomach and duodenal sweep are unremarkable. No small bowel wall thickening or dilatation. No evidence of obstruction. A normal appendix is visualized. No colonic dilatation or wall thickening. Vascular/Lymphatic: The aorta is normal caliber. No suspicious or enlarged lymph nodes in the included lymphatic chains. Reproductive: Uterus is surgically absent. Retained ovaries. 2.9 cm cyst adjacent the left ovary. No other concerning adnexal lesion. Other: No free fluid or free air. No traumatic abdominal injury. No bowel containing hernias. Musculoskeletal: Multilevel degenerative changes are present in the imaged portions of the spine. Additional degenerative changes in the hips. No acute osseous abnormality or suspicious osseous lesion. Few stable bone islands in the left pelvis. Review of the MIP images confirms the above findings. IMPRESSION: 1. No acute traumatic injury of the chest, abdomen or pelvis. 2. Stable web-like filling defects in the branching of the left lower lobar artery, favored to represent chronic pulmonary emboli. 3. No acute pulmonary arterial filling defects are identified. No evidence of right heart strain. 4. Mosaic attenuation of the lungs may reflect imaging during exhalation or underlying air trapping/small airways disease. 5. 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 Consider nonemergent high-resolution chest CT for further evaluation. 6. Post hysterectomy with retained ovaries. 2.9 cm left adnexal cyst in the left ovary is within physiologic normal for a premenopausal female. 7. Hepatic steatosis. 8. Aortic Atherosclerosis (ICD10-I70.0) Electronically Signed   By: Lovena Le M.D.   On: 11/08/2019 23:42   Dg Pelvis Portable  Result Date: 11/08/2019 CLINICAL  DATA:  Fall EXAM: PORTABLE PELVIS 1-2 VIEWS COMPARISON:  Radiograph 01/02/2008 FINDINGS: Appearance worrisome for a minimally impacted subcapital left femoral neck fracture. Femoral heads remain normally located. No other acute pelvic fracture or traumatic diastasis. Degenerative changes noted in the SI joints and both hips. Included portions of the lumbar spine are unremarkable aside from mild discogenic change. Bowel gas pattern is normal. Soft tissues are unremarkable. IMPRESSION: Findings worrisome for a minimally impacted subcapital left femoral neck fracture. Electronically Signed   By: Lovena Le M.D.   On: 11/08/2019 21:41   Ct L-spine No Charge  Result Date: 11/08/2019 CLINICAL DATA:  Fall EXAM: CT LUMBAR SPINE WITHOUT CONTRAST TECHNIQUE: Multiplanar CT reconstructions of the lumbar spine were performed from concurrent CT of the chest, abdomen and pelvis. COMPARISON:  CT abdomen pelvis 10/20/2017, CT L-spine 02/03/2013 FINDINGS: Segmentation: 5 lumbar type vertebrae.  Alignment: Preservation of the normal lumbar lordosis. Mild grade 1 anterolisthesis L5 on S1 is stable from comparison in 2014. Posterior elements are normally aligned without abnormal facet widening. Vertebrae: No acute vertebral body fracture, height loss other focal pathologic process. Posterior elements are intact. Included portions of the sacrum and pelvis are unremarkable aside from vacuum phenomenon in both SI joints and mild SI joint widening, similar to comparison. Paraspinal and other soft tissues: No paravertebral fluid or swelling. Mild posterior body wall edema. For findings in the abdomen and pelvis, please see dedicated CT from which this study is generated. Disc levels: Minimal posterior disc bulging at L4-5 and L5-S1 without significant canal stenosis. Posterior facet hypertrophic changes are most pronounced at the same levels with resulting mild bilateral foraminal narrowing at L4-5 and moderate bilateral neural  foraminal narrowing at L5-S1. IMPRESSION: 1. No acute fracture or subluxation of the lumbar spine. 2. Mild grade 1 anterolisthesis L5 on S1 is stable from comparison in 2014. 3. Posterior facet hypertrophic changes at L4-5 and L5-S1 resulting in mild bilateral foraminal narrowing at L4-5 and moderate bilateral neural foraminal narrowing at L5-S1. 4. For findings in the abdomen and pelvis, please see dedicated CT from which this study is generated. Electronically Signed   By: Lovena Le M.D.   On: 11/08/2019 23:51   Dg Chest Portable 1 View  Result Date: 11/08/2019 CLINICAL DATA:  Fall, chest pain EXAM: PORTABLE CHEST 1 VIEW COMPARISON:  02/27/2018 FINDINGS: Heart and mediastinal contours are within normal limits. No focal opacities or effusions. No acute bony abnormality. IMPRESSION: No active disease. Electronically Signed   By: Rolm Baptise M.D.   On: 11/08/2019 21:40    Pending Labs Unresulted Labs (From admission, onward)    Start     Ordered   11/09/19 0603  Hemoglobin A1c  Once,   R     11/09/19 0603   11/09/19 0500  HIV Antibody (routine testing w rflx)  (HIV Antibody (Routine testing w reflex) panel)  Tomorrow morning,   R     11/09/19 0151   11/08/19 2351  SARS CORONAVIRUS 2 (TAT 6-24 HRS) Nasopharyngeal Nasopharyngeal Swab  (Asymptomatic/Tier 3)  Once,   STAT    Question Answer Comment  Is this test for diagnosis or screening Screening   Symptomatic for COVID-19 as defined by CDC No   Hospitalized for COVID-19 No   Admitted to ICU for COVID-19 No   Previously tested for COVID-19 Yes   Resident in a congregate (group) care setting No   Employed in healthcare setting No   Pregnant No      11/08/19 2350          Vitals/Pain Today's Vitals   11/09/19 0615 11/09/19 0641 11/09/19 0644 11/09/19 0645  BP: (!) 133/97   135/90  Pulse: 96   93  Resp: 13   13  Temp:      TempSrc:      SpO2: 95%   95%  Weight:      Height:      PainSc:  3  3      Isolation Precautions No  active isolations  Medications Medications  amLODipine (NORVASC) tablet 10 mg (10 mg Oral Given 11/09/19 0214)  chlorthalidone (HYGROTON) tablet 50 mg (50 mg Oral Given 11/09/19 0601)  escitalopram (LEXAPRO) tablet 10 mg (10 mg Oral Given 11/09/19 0215)  rivaroxaban (XARELTO) tablet 20 mg (20 mg Oral Given 11/09/19 0600)  levETIRAcetam (KEPPRA) tablet 500 mg (500 mg Oral Given  11/09/19 0215)  topiramate (TOPAMAX) tablet 25 mg (25 mg Oral Given 11/09/19 0214)  albuterol (PROVENTIL) (2.5 MG/3ML) 0.083% nebulizer solution 3 mL (has no administration in time range)  montelukast (SINGULAIR) tablet 10 mg (10 mg Oral Given 11/09/19 0601)  mometasone-formoterol (DULERA) 200-5 MCG/ACT inhaler 2 puff (has no administration in time range)  0.9 %  sodium chloride infusion (has no administration in time range)  acetaminophen (TYLENOL) tablet 650 mg (650 mg Oral Given 11/09/19 0600)    Or  acetaminophen (TYLENOL) suppository 650 mg ( Rectal See Alternative 11/09/19 0600)  ondansetron (ZOFRAN) tablet 4 mg (has no administration in time range)    Or  ondansetron (ZOFRAN) injection 4 mg (has no administration in time range)  insulin aspart (novoLOG) injection 0-9 Units (7 Units Subcutaneous Given 11/09/19 0842)  insulin aspart (novoLOG) injection 0-5 Units (3 Units Subcutaneous Given 11/09/19 0216)  morphine 2 MG/ML injection 2-4 mg (has no administration in time range)  iohexol (OMNIPAQUE) 350 MG/ML injection 100 mL (100 mLs Intravenous Contrast Given 11/08/19 2253)  morphine 4 MG/ML injection 4 mg (4 mg Intravenous Given 11/08/19 2339)  ondansetron (ZOFRAN) injection 4 mg (4 mg Intravenous Given 11/08/19 2339)    Mobility walks Moderate fall risk   Focused Assessments    R Recommendations: See Admitting Provider Note  Report given to: Darylene Price [id number] RN  Additional Notes:

## 2019-11-09 NOTE — Progress Notes (Addendum)
PROGRESS NOTE  Makayla Frazier [id number] DOB: 1971/10/22 DOA: 11/08/2019 PCP: Harvie Heck, MD  HPI/Recap of past 24 hours: HPI from Dr Roxan Hockey is a 48 y.o. female with medical history significant for seizure disorder, PE on Xarelto, hypertension, type 2 diabetes mellitus, and asthma, now presenting to emergency department after a transient loss of consciousness and reports pain in the right side of her back as well as new right leg numbness and weakness.  Patient reports that she been in her usual state of health and was having an uneventful day when she was lifting up a case of water bottles and then lost consciousness.  Family was nearby, did not see her fall, but saw her immediately afterwards with no seizure-like activity.  In the ED, patient noted to be tachycardic with hypertensive crisis, SBP in the 200s. Head CT and cervical spine CT are negative for acute findings. CT chest/abdomen/pelvis in the ED which is notable for suspected chronic PE involving the left lower lobar artery without acute PE or right heart strain.  Labs notable for glucose of 390, UDS negative.  Patient admitted for further management.   Today, reports feeling tired overall, unable to lift her right lower extremity since the fall, left upper extremity has been weak for the past couple of years, reports soreness around the right side of her body where she fell.  Patient denies any chest pain, shortness of breath, abdominal pain, nausea/vomiting/diarrhea, fever/chills.    Assessment/Plan: Principal Problem:   Transient loss of consciousness Active Problems:   Essential hypertension   Chronic pulmonary embolism (HCC)   Seizure disorder (HCC)   Uncontrolled diabetes mellitus with hyperglycemia (HCC)   Weakness of right leg   Multiple idiopathic pulmonary cysts   Hypertensive urgency  Transient loss of consciousness/syncope ?? due to seizures, rule out cardiac causes Troponin 5-->12, EKG with  no acute ST changes Head CT and cervical spine CT are negative for acute findings. CT chest/abdomen/pelvis in the ED which is notable for suspected chronic PE involving the left lower lobar artery without acute PE or right heart strain Echo with EF of 60 to [id number], grade 1 diastolic dysfunction EEG pending We will order MRI brain May consult neurology in a.m. pending results of MRI/EEG Telemetry, frequent neuro checks  Right leg weakness and numbness/??Todds paralysis Presents after transient LOC with fall and reports right-sided back pain with new right leg numbness and weakness Will order MRI brain MRI T- and L-spine showed moderate lower lumbar facet arthrosis, small central disc protrusion at C7-T1 indenting the ventral spinal cord, no associated spinal canal stenosis  Supportive care PT/OT  Hypertensive crisis BP stable SBP in the 200s on admission Continue Norvasc, chlorthalidone,lisinopril   Seizure disorder  Last seizure was ~6 months ago, and reports adherence with her antiepileptics  EEG pending Continue antiepileptics  Type II DM  A1c 9.9, uncontrolled SSI, Accu-Cheks, hypoglycemic protocol Hold home Trulicity    Chronic PE  Continue Xarelto    Asthma   Continue ICS/LABA, Singulair, and as-needed albuterol  Obesity Lifestyle modification advised         Malnutrition Type:      Malnutrition Characteristics:      Nutrition Interventions:       Estimated body mass index is 31.83 kg/m as calculated from the following:   Height as of this encounter: 5' (1.524 m).   Weight as of this encounter: 73.9 kg.     Code Status: Full  Family Communication:  None at bedside  Disposition Plan: To be determined   Consultants:  None  Procedures:  None  Antimicrobials:  None  DVT prophylaxis: Xarelto   Objective: Vitals:   11/09/19 0830 11/09/19 0845 11/09/19 0900 11/09/19 0935  BP: (!) 118/95 (!) 111/96 (!) 126/95 (!) 130/92  Pulse: 96  95 95 91  Resp: 13 13 12 14   Temp:    98.1 F (36.7 C)  TempSrc:    Oral  SpO2: 95% 96% 94% 97%  Weight:      Height:       No intake or output data in the 24 hours ending 11/09/19 1459 Filed Weights   11/08/19 2320  Weight: 73.9 kg    Exam:  General: NAD   Cardiovascular: S1, S2 present  Respiratory: CTAB  Abdomen: Soft, nontender, nondistended, bowel sounds present  Musculoskeletal: No bilateral pedal edema noted  Skin: Normal  Psychiatry: Normal mood  Neurology: Unable to lift right lower extremity, 4/5 strength in left upper extremity, all other extremities 5/5, no other obvious focal neurologic deficits noted    Data Reviewed: CBC: Recent Labs  Lab 11/08/19 2116 11/09/19 0603  WBC 10.0 10.7*  NEUTROABS 4.4  --   HGB 14.0 12.8  HCT 42.5 40.5  MCV 79.3* 80.5  PLT 314 [id number]   Basic Metabolic Panel: Recent Labs  Lab 11/08/19 2116 11/09/19 0603  NA 133* 135  K 4.0 3.9  CL 95* 97*  CO2 26 28  GLUCOSE 390* 290*  BUN 9 8  CREATININE 0.82 0.72  CALCIUM 9.7 9.3   GFR: Estimated Creatinine Clearance: 77.2 mL/min (by C-G formula based on SCr of 0.72 mg/dL). Liver Function Tests: Recent Labs  Lab 11/08/19 2116  AST 36  ALT 44  ALKPHOS 64  BILITOT 0.5  PROT 8.2*  ALBUMIN 3.8   No results for input(s): LIPASE, AMYLASE in the last 168 hours. No results for input(s): AMMONIA in the last 168 hours. Coagulation Profile: No results for input(s): INR, PROTIME in the last 168 hours. Cardiac Enzymes: No results for input(s): CKTOTAL, CKMB, CKMBINDEX, TROPONINI in the last 168 hours. BNP (last 3 results) No results for input(s): PROBNP in the last 8760 hours. HbA1C: Recent Labs    11/09/19 0603  HGBA1C 9.9*   CBG: Recent Labs  Lab 11/08/19 2012 11/09/19 0157 11/09/19 0803 11/09/19 0937 11/09/19 1138  GLUCAP 363* 254* 325* 290* 271*   Lipid Profile: No results for input(s): CHOL, HDL, LDLCALC, TRIG, CHOLHDL, LDLDIRECT in the last 72  hours. Thyroid Function Tests: No results for input(s): TSH, T4TOTAL, FREET4, T3FREE, THYROIDAB in the last 72 hours. Anemia Panel: No results for input(s): VITAMINB12, FOLATE, FERRITIN, TIBC, IRON, RETICCTPCT in the last 72 hours. Urine analysis:    Component Value Date/Time   COLORURINE STRAW (A) 11/08/2019 2117   APPEARANCEUR CLEAR 11/08/2019 2117   LABSPEC 1.024 11/08/2019 2117   LABSPEC 1.015 12/26/2012 1422   PHURINE 8.0 11/08/2019 2117   GLUCOSEU >=500 (A) 11/08/2019 2117   GLUCOSEU > 1000 mg/dL (A) 07/04/2010 2040   HGBUR NEGATIVE 11/08/2019 2117   HGBUR moderate 07/04/2010 1357   BILIRUBINUR NEGATIVE 11/08/2019 2117   BILIRUBINUR Negative 12/26/2012 [place] 11/08/2019 2117   PROTEINUR NEGATIVE 11/08/2019 2117   UROBILINOGEN 0.2 09/05/2019 1044   NITRITE NEGATIVE 11/08/2019 2117   LEUKOCYTESUR NEGATIVE 11/08/2019 2117   LEUKOCYTESUR Negative 12/26/2012 1422   Sepsis Labs: @LABRCNTIP (procalcitonin:4,lacticidven:4)  ) Recent Results (from the past 240 hour(s))  SARS CORONAVIRUS 2 (TAT  6-24 HRS) Nasopharyngeal Nasopharyngeal Swab     Status: None   Collection Time: 11/09/19 12:06 AM   Specimen: Nasopharyngeal Swab  Result Value Ref Range Status   SARS Coronavirus 2 NEGATIVE NEGATIVE Final    Comment: (NOTE) SARS-CoV-2 target nucleic acids are NOT DETECTED. The SARS-CoV-2 RNA is generally detectable in upper and lower respiratory specimens during the acute phase of infection. Negative results do not preclude SARS-CoV-2 infection, do not rule out co-infections with other pathogens, and should not be used as the sole basis for treatment or other patient management decisions. Negative results must be combined with clinical observations, patient history, and epidemiological information. The expected result is Negative. Fact Sheet for Patients: SugarRoll.be Fact Sheet for Healthcare  Providers: https://www.woods-mathews.com/ This test is not yet approved or cleared by the [place] FDA and  has been authorized for detection and/or diagnosis of SARS-CoV-2 by FDA under an Emergency Use Authorization (EUA). This EUA will remain  in effect (meaning this test can be used) for the duration of the COVID-19 declaration under Section 56 4(b)(1) of the Act, 21 U.S.C. section 360bbb-3(b)(1), unless the authorization is terminated or revoked sooner. Performed at [facility], [place] [address] [place], [place] [address]       Studies: Ct Head Wo Contrast  Result Date: 11/08/2019 CLINICAL DATA:  Syncope, fall. EXAM: CT HEAD WITHOUT CONTRAST TECHNIQUE: Contiguous axial images were obtained from the base of the skull through the vertex without intravenous contrast. COMPARISON:  05/25/2016 FINDINGS: Brain: No acute intracranial abnormality. Specifically, no hemorrhage, hydrocephalus, mass lesion, acute infarction, or significant intracranial injury. Vascular: No hyperdense vessel or unexpected calcification. Skull: No acute calvarial abnormality. Sinuses/Orbits: Visualized paranasal sinuses and mastoids clear. Orbital soft tissues unremarkable. Other: None IMPRESSION: Normal study. Electronically Signed   By: Rolm Baptise M.D.   On: 11/08/2019 23:27   Ct Angio Chest Pe W And/or Wo Contrast  Result Date: 11/08/2019 CLINICAL DATA:  Fall while walking from living room to kitchen, history of seizures, no noted seizure activity, anticoagulated EXAM: CT ANGIOGRAPHY CHEST CT ABDOMEN AND PELVIS WITH CONTRAST TECHNIQUE: Multidetector CT imaging of the chest was performed using the standard protocol during bolus administration of intravenous contrast. Multiplanar CT image reconstructions and MIPs were obtained to evaluate the vascular anatomy. Multidetector CT imaging of the abdomen and pelvis was performed using the standard protocol during bolus administration of intravenous  contrast. CONTRAST:  115mL OMNIPAQUE IOHEXOL 350 MG/ML SOLN COMPARISON:  CT abdomen pelvis 10/20/2017, CTA chest 05/12/2015 FINDINGS: CTA CHEST FINDINGS Cardiovascular: Satisfactory opacification the pulmonary arteries to the segmental level. Stable web-like filling defects are present in branching of the left lower lobar artery. No acute hypoattenuating filling defect is seen. Central pulmonary arteries are normal caliber. Normal caliber of the thoracic aorta. No acute luminal abnormality, periaortic stranding or hemorrhage. Normal 3 vessel branching of the aortic arch. Normal heart size. No pericardial effusion. Mediastinum/Nodes: No enlarged mediastinal, hilar, or axillary lymph nodes. Thyroid gland and thoracic inlet are unremarkable. No acute abnormality of the trachea. Small amount of ingested material within the esophagus. Lungs/Pleura: Numerous pulmonary cysts are similar to prior. Mosaic attenuation within the lungs likely related to imaging during exhalation though a possible air trapping or small airways disease could have a similar appearance. No consolidative opacity. No acute traumatic abnormality of the lung parenchyma. Basilar atelectatic changes are noted. No pneumothorax or effusion. Musculoskeletal: No chest wall abnormality. No acute or significant osseous findings. Prominent vascular channel in the left  anterior articular facet of T9 is stable from prior. Review of the MIP images confirms the above findings. CT ABDOMEN and PELVIS FINDINGS Hepatobiliary: No focal hepatic injury or perihepatic hematoma. Diffuse hepatic hypoattenuation compatible with hepatic steatosis. No focal liver abnormality is seen. Patient is post cholecystectomy. Slight prominence of the biliary tree likely related to reservoir effect. No calcified intraductal gallstones. Pancreas: Unremarkable. No pancreatic ductal dilatation or surrounding inflammatory changes. Spleen: Normal arterial enhancement of the spleen. No splenic  injury or perisplenic hematoma. Adrenals/Urinary Tract: No adrenal hematoma or suspicious adrenal lesions. Kidneys enhance and excrete symmetrically. No direct renal injury or perirenal hematoma. No suspicious renal lesions, urolithiasis or hydronephrosis. No acute bladder injury or other abnormality. Stomach/Bowel: Distal esophagus, stomach and duodenal sweep are unremarkable. No small bowel wall thickening or dilatation. No evidence of obstruction. A normal appendix is visualized. No colonic dilatation or wall thickening. Vascular/Lymphatic: The aorta is normal caliber. No suspicious or enlarged lymph nodes in the included lymphatic chains. Reproductive: Uterus is surgically absent. Retained ovaries. 2.9 cm cyst adjacent the left ovary. No other concerning adnexal lesion. Other: No free fluid or free air. No traumatic abdominal injury. No bowel containing hernias. Musculoskeletal: Multilevel degenerative changes are present in the imaged portions of the spine. Additional degenerative changes in the hips. No acute osseous abnormality or suspicious osseous lesion. Few stable bone islands in the left pelvis. Review of the MIP images confirms the above findings. IMPRESSION: 1. No acute traumatic injury of the chest, abdomen or pelvis. 2. Stable web-like filling defects in the branching of the left lower lobar artery, favored to represent chronic pulmonary emboli. 3. No acute pulmonary arterial filling defects are identified. No evidence of right heart strain. 4. Mosaic attenuation of the lungs may reflect imaging during exhalation or underlying air trapping/small airways disease. 5. 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 Consider nonemergent high-resolution chest CT for further evaluation. 6. Post hysterectomy with retained ovaries. 2.9 cm left adnexal cyst in the left ovary is within physiologic normal for a premenopausal female. 7. Hepatic  steatosis. 8. Aortic Atherosclerosis (ICD10-I70.0) Electronically Signed   By: Lovena Le M.D.   On: 11/08/2019 23:42   Ct Cervical Spine Wo Contrast  Result Date: 11/08/2019 CLINICAL DATA:  Syncope, fall EXAM: CT CERVICAL SPINE WITHOUT CONTRAST TECHNIQUE: Multidetector CT imaging of the cervical spine was performed without intravenous contrast. Multiplanar CT image reconstructions were also generated. COMPARISON:  05/18/2012 FINDINGS: Alignment: No subluxation.  Loss of cervical lordosis. Skull base and vertebrae: No acute fracture. No primary bone lesion or focal pathologic process. Soft tissues and spinal canal: No prevertebral fluid or swelling. No visible canal hematoma. Disc levels: Early spurring. Ossification of the posterior longitudinal ligament. Upper chest: Negative Other: None IMPRESSION: No acute bony abnormality. Electronically Signed   By: Rolm Baptise M.D.   On: 11/08/2019 23:28   Mr Thoracic Spine Wo Contrast  Result Date: 11/09/2019 CLINICAL DATA:  Back pain with right lower extremity numbness and weakness. EXAM: MRI THORACIC AND LUMBAR SPINE WITHOUT CONTRAST TECHNIQUE: Multiplanar and multiecho pulse sequences of the thoracic and lumbar spine were obtained without intravenous contrast. COMPARISON:  CT lumbar spine 11/08/2019 FINDINGS: MRI THORACIC SPINE FINDINGS Alignment:  Physiologic. Vertebrae: No fracture, evidence of discitis, or bone lesion. Cord:  Normal signal and morphology. Paraspinal and other soft tissues: Negative. Disc levels: C7-T1: Small central disc protrusion indenting the ventral spinal cord. Otherwise, the thoracic disc levels are normal. MRI LUMBAR  SPINE FINDINGS Segmentation:  Stand Alignment:  Normal Vertebrae:  Normal Conus medullaris and cauda equina: Conus extends to the L1 level. Conus and cauda equina appear normal. Paraspinal and other soft tissues: Markedly distended urinary bladder. Cystic focus in the left lower quadrant. Disc levels: L1-2: Normal L2-3:  Mild facet hypertrophy. Normal disc. No stenosis. L3-4: Mild facet hypertrophy. Normal disc. No stenosis. L4-5: Moderate facet hypertrophy, right worse than left. Normal disc. No spinal canal or neural foraminal stenosis. L5-S1: Moderate facet hypertrophy. No disc herniation or stenosis. IMPRESSION: 1. No thoracic or lumbar spinal canal or neural foraminal stenosis. 2. Small central disc protrusion at C7-T1 indenting the ventral spinal cord. No associated spinal canal stenosis. 3. Moderate lower lumbar facet arthrosis, which may be a source of local low back pain. Electronically Signed   By: Ulyses Jarred M.D.   On: 11/09/2019 04:17   Mr Lumbar Spine Wo Contrast  Result Date: 11/09/2019 CLINICAL DATA:  Back pain with right lower extremity numbness and weakness. EXAM: MRI THORACIC AND LUMBAR SPINE WITHOUT CONTRAST TECHNIQUE: Multiplanar and multiecho pulse sequences of the thoracic and lumbar spine were obtained without intravenous contrast. COMPARISON:  CT lumbar spine 11/08/2019 FINDINGS: MRI THORACIC SPINE FINDINGS Alignment:  Physiologic. Vertebrae: No fracture, evidence of discitis, or bone lesion. Cord:  Normal signal and morphology. Paraspinal and other soft tissues: Negative. Disc levels: C7-T1: Small central disc protrusion indenting the ventral spinal cord. Otherwise, the thoracic disc levels are normal. MRI LUMBAR SPINE FINDINGS Segmentation:  Stand Alignment:  Normal Vertebrae:  Normal Conus medullaris and cauda equina: Conus extends to the L1 level. Conus and cauda equina appear normal. Paraspinal and other soft tissues: Markedly distended urinary bladder. Cystic focus in the left lower quadrant. Disc levels: L1-2: Normal L2-3: Mild facet hypertrophy. Normal disc. No stenosis. L3-4: Mild facet hypertrophy. Normal disc. No stenosis. L4-5: Moderate facet hypertrophy, right worse than left. Normal disc. No spinal canal or neural foraminal stenosis. L5-S1: Moderate facet hypertrophy. No disc herniation  or stenosis. IMPRESSION: 1. No thoracic or lumbar spinal canal or neural foraminal stenosis. 2. Small central disc protrusion at C7-T1 indenting the ventral spinal cord. No associated spinal canal stenosis. 3. Moderate lower lumbar facet arthrosis, which may be a source of local low back pain. Electronically Signed   By: Ulyses Jarred M.D.   On: 11/09/2019 04:17   Ct Abdomen Pelvis W Contrast  Result Date: 11/08/2019 CLINICAL DATA:  Fall while walking from living room to kitchen, history of seizures, no noted seizure activity, anticoagulated EXAM: CT ANGIOGRAPHY CHEST CT ABDOMEN AND PELVIS WITH CONTRAST TECHNIQUE: Multidetector CT imaging of the chest was performed using the standard protocol during bolus administration of intravenous contrast. Multiplanar CT image reconstructions and MIPs were obtained to evaluate the vascular anatomy. Multidetector CT imaging of the abdomen and pelvis was performed using the standard protocol during bolus administration of intravenous contrast. CONTRAST:  122mL OMNIPAQUE IOHEXOL 350 MG/ML SOLN COMPARISON:  CT abdomen pelvis 10/20/2017, CTA chest 05/12/2015 FINDINGS: CTA CHEST FINDINGS Cardiovascular: Satisfactory opacification the pulmonary arteries to the segmental level. Stable web-like filling defects are present in branching of the left lower lobar artery. No acute hypoattenuating filling defect is seen. Central pulmonary arteries are normal caliber. Normal caliber of the thoracic aorta. No acute luminal abnormality, periaortic stranding or hemorrhage. Normal 3 vessel branching of the aortic arch. Normal heart size. No pericardial effusion. Mediastinum/Nodes: No enlarged mediastinal, hilar, or axillary lymph nodes. Thyroid gland and thoracic inlet are unremarkable. No acute  abnormality of the trachea. Small amount of ingested material within the esophagus. Lungs/Pleura: Numerous pulmonary cysts are similar to prior. Mosaic attenuation within the lungs likely related to  imaging during exhalation though a possible air trapping or small airways disease could have a similar appearance. No consolidative opacity. No acute traumatic abnormality of the lung parenchyma. Basilar atelectatic changes are noted. No pneumothorax or effusion. Musculoskeletal: No chest wall abnormality. No acute or significant osseous findings. Prominent vascular channel in the left anterior articular facet of T9 is stable from prior. Review of the MIP images confirms the above findings. CT ABDOMEN and PELVIS FINDINGS Hepatobiliary: No focal hepatic injury or perihepatic hematoma. Diffuse hepatic hypoattenuation compatible with hepatic steatosis. No focal liver abnormality is seen. Patient is post cholecystectomy. Slight prominence of the biliary tree likely related to reservoir effect. No calcified intraductal gallstones. Pancreas: Unremarkable. No pancreatic ductal dilatation or surrounding inflammatory changes. Spleen: Normal arterial enhancement of the spleen. No splenic injury or perisplenic hematoma. Adrenals/Urinary Tract: No adrenal hematoma or suspicious adrenal lesions. Kidneys enhance and excrete symmetrically. No direct renal injury or perirenal hematoma. No suspicious renal lesions, urolithiasis or hydronephrosis. No acute bladder injury or other abnormality. Stomach/Bowel: Distal esophagus, stomach and duodenal sweep are unremarkable. No small bowel wall thickening or dilatation. No evidence of obstruction. A normal appendix is visualized. No colonic dilatation or wall thickening. Vascular/Lymphatic: The aorta is normal caliber. No suspicious or enlarged lymph nodes in the included lymphatic chains. Reproductive: Uterus is surgically absent. Retained ovaries. 2.9 cm cyst adjacent the left ovary. No other concerning adnexal lesion. Other: No free fluid or free air. No traumatic abdominal injury. No bowel containing hernias. Musculoskeletal: Multilevel degenerative changes are present in the imaged  portions of the spine. Additional degenerative changes in the hips. No acute osseous abnormality or suspicious osseous lesion. Few stable bone islands in the left pelvis. Review of the MIP images confirms the above findings. IMPRESSION: 1. No acute traumatic injury of the chest, abdomen or pelvis. 2. Stable web-like filling defects in the branching of the left lower lobar artery, favored to represent chronic pulmonary emboli. 3. No acute pulmonary arterial filling defects are identified. No evidence of right heart strain. 4. Mosaic attenuation of the lungs may reflect imaging during exhalation or underlying air trapping/small airways disease. 5. 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 Consider nonemergent high-resolution chest CT for further evaluation. 6. Post hysterectomy with retained ovaries. 2.9 cm left adnexal cyst in the left ovary is within physiologic normal for a premenopausal female. 7. Hepatic steatosis. 8. Aortic Atherosclerosis (ICD10-I70.0) Electronically Signed   By: Lovena Le M.D.   On: 11/08/2019 23:42   Dg Pelvis Portable  Result Date: 11/08/2019 CLINICAL DATA:  Fall EXAM: PORTABLE PELVIS 1-2 VIEWS COMPARISON:  Radiograph 01/02/2008 FINDINGS: Appearance worrisome for a minimally impacted subcapital left femoral neck fracture. Femoral heads remain normally located. No other acute pelvic fracture or traumatic diastasis. Degenerative changes noted in the SI joints and both hips. Included portions of the lumbar spine are unremarkable aside from mild discogenic change. Bowel gas pattern is normal. Soft tissues are unremarkable. IMPRESSION: Findings worrisome for a minimally impacted subcapital left femoral neck fracture. Electronically Signed   By: Lovena Le M.D.   On: 11/08/2019 21:41   Ct L-spine No Charge  Result Date: 11/08/2019 CLINICAL DATA:  Fall EXAM: CT LUMBAR SPINE WITHOUT CONTRAST TECHNIQUE: Multiplanar  CT reconstructions of the lumbar spine were performed from concurrent CT  of the chest, abdomen and pelvis. COMPARISON:  CT abdomen pelvis 10/20/2017, CT L-spine 02/03/2013 FINDINGS: Segmentation: 5 lumbar type vertebrae. Alignment: Preservation of the normal lumbar lordosis. Mild grade 1 anterolisthesis L5 on S1 is stable from comparison in 2014. Posterior elements are normally aligned without abnormal facet widening. Vertebrae: No acute vertebral body fracture, height loss other focal pathologic process. Posterior elements are intact. Included portions of the sacrum and pelvis are unremarkable aside from vacuum phenomenon in both SI joints and mild SI joint widening, similar to comparison. Paraspinal and other soft tissues: No paravertebral fluid or swelling. Mild posterior body wall edema. For findings in the abdomen and pelvis, please see dedicated CT from which this study is generated. Disc levels: Minimal posterior disc bulging at L4-5 and L5-S1 without significant canal stenosis. Posterior facet hypertrophic changes are most pronounced at the same levels with resulting mild bilateral foraminal narrowing at L4-5 and moderate bilateral neural foraminal narrowing at L5-S1. IMPRESSION: 1. No acute fracture or subluxation of the lumbar spine. 2. Mild grade 1 anterolisthesis L5 on S1 is stable from comparison in 2014. 3. Posterior facet hypertrophic changes at L4-5 and L5-S1 resulting in mild bilateral foraminal narrowing at L4-5 and moderate bilateral neural foraminal narrowing at L5-S1. 4. For findings in the abdomen and pelvis, please see dedicated CT from which this study is generated. Electronically Signed   By: Lovena Le M.D.   On: 11/08/2019 23:51   Dg Chest Portable 1 View  Result Date: 11/08/2019 CLINICAL DATA:  Fall, chest pain EXAM: PORTABLE CHEST 1 VIEW COMPARISON:  02/27/2018 FINDINGS: Heart and mediastinal contours are within normal limits. No focal opacities or effusions. No acute bony  abnormality. IMPRESSION: No active disease. Electronically Signed   By: Rolm Baptise M.D.   On: 11/08/2019 21:40    Scheduled Meds:  amLODipine  10 mg Oral QHS   atorvastatin  10 mg Oral q1800   chlorthalidone  50 mg Oral QHS   escitalopram  10 mg Oral QHS   insulin aspart  0-5 Units Subcutaneous QHS   insulin aspart  0-9 Units Subcutaneous TID WC   levETIRAcetam  500 mg Oral QHS   lisinopril  2.5 mg Oral Daily   mometasone-formoterol  2 puff Inhalation BID   montelukast  10 mg Oral QHS   pregabalin  100 mg Oral TID   rivaroxaban  20 mg Oral QHS   sodium chloride flush  3 mL Intravenous Q12H   sodium chloride flush  3 mL Intravenous Q12H   topiramate  25 mg Oral QHS    Continuous Infusions:  sodium chloride       LOS: 0 days     Alma Friendly, MD [facility]  If 7PM-7AM, please contact night-coverage [email or web address] 11/09/2019, 2:59 PM

## 2019-11-09 NOTE — Plan of Care (Signed)
  Problem: Activity: Goal: Risk for activity intolerance will decrease Outcome: Progressing   

## 2019-11-09 NOTE — Progress Notes (Signed)
New Admission Note:   Arrival Method:  Bed  Mental Orientation: Alert and oriented  Telemetry: Box 9  Assessment: Completed Skin: intact  [id number] forearm  Pain: 7/10 medication given Tubes: none Safety Measures: Safety Fall Prevention Plan has been given, discussed and signed Admission: Completed [facility] Orientation: Patient has been orientated to the room, unit and staff.  Family: none  Orders have been reviewed and implemented. Will continue to monitor the patient. Call light has been placed within reach and bed alarm has been activated.   Thurma Priego RN [facility] Phone: [phone]

## 2019-11-09 NOTE — Progress Notes (Signed)
*  PRELIMINARY RESULTS* Echocardiogram 2D Echocardiogram has been performed.  Leavy Cella 11/09/2019, 11:53 AM

## 2019-11-10 ENCOUNTER — Observation Stay (HOSPITAL_COMMUNITY)
Admit: 2019-11-10 | Discharge: 2019-11-10 | Disposition: A | Discharge status: Home / Self Care | Payer: Medicare Other | Attending: Family Medicine | Admitting: Family Medicine

## 2019-11-10 ENCOUNTER — Observation Stay (HOSPITAL_COMMUNITY): Payer: Medicare Other

## 2019-11-10 DIAGNOSIS — I10 Essential (primary) hypertension: Secondary | ICD-10-CM | POA: Diagnosis present

## 2019-11-10 DIAGNOSIS — E559 Vitamin D deficiency, unspecified: Secondary | ICD-10-CM | POA: Diagnosis present

## 2019-11-10 DIAGNOSIS — F329 Major depressive disorder, single episode, unspecified: Secondary | ICD-10-CM | POA: Diagnosis present

## 2019-11-10 DIAGNOSIS — J45909 Unspecified asthma, uncomplicated: Secondary | ICD-10-CM | POA: Diagnosis present

## 2019-11-10 DIAGNOSIS — E079 Disorder of thyroid, unspecified: Secondary | ICD-10-CM | POA: Diagnosis present

## 2019-11-10 DIAGNOSIS — R55 Syncope and collapse: Secondary | ICD-10-CM | POA: Diagnosis present

## 2019-11-10 DIAGNOSIS — E785 Hyperlipidemia, unspecified: Secondary | ICD-10-CM | POA: Diagnosis present

## 2019-11-10 DIAGNOSIS — I160 Hypertensive urgency: Secondary | ICD-10-CM | POA: Diagnosis present

## 2019-11-10 DIAGNOSIS — E669 Obesity, unspecified: Secondary | ICD-10-CM | POA: Diagnosis present

## 2019-11-10 DIAGNOSIS — I959 Hypotension, unspecified: Secondary | ICD-10-CM | POA: Diagnosis not present

## 2019-11-10 DIAGNOSIS — B009 Herpesviral infection, unspecified: Secondary | ICD-10-CM | POA: Diagnosis present

## 2019-11-10 DIAGNOSIS — Z915 Personal history of self-harm: Secondary | ICD-10-CM | POA: Diagnosis not present

## 2019-11-10 DIAGNOSIS — G8384 Todd's paralysis (postepileptic): Secondary | ICD-10-CM | POA: Diagnosis present

## 2019-11-10 DIAGNOSIS — E1165 Type 2 diabetes mellitus with hyperglycemia: Secondary | ICD-10-CM | POA: Diagnosis present

## 2019-11-10 DIAGNOSIS — G4730 Sleep apnea, unspecified: Secondary | ICD-10-CM | POA: Diagnosis present

## 2019-11-10 DIAGNOSIS — R339 Retention of urine, unspecified: Secondary | ICD-10-CM | POA: Diagnosis present

## 2019-11-10 DIAGNOSIS — Z20828 Contact with and (suspected) exposure to other viral communicable diseases: Secondary | ICD-10-CM | POA: Diagnosis present

## 2019-11-10 DIAGNOSIS — Z6831 Body mass index (BMI) 31.0-31.9, adult: Secondary | ICD-10-CM | POA: Diagnosis not present

## 2019-11-10 DIAGNOSIS — R159 Full incontinence of feces: Secondary | ICD-10-CM | POA: Diagnosis present

## 2019-11-10 DIAGNOSIS — I2782 Chronic pulmonary embolism: Secondary | ICD-10-CM | POA: Diagnosis present

## 2019-11-10 DIAGNOSIS — J984 Other disorders of lung: Secondary | ICD-10-CM | POA: Diagnosis not present

## 2019-11-10 DIAGNOSIS — G40909 Epilepsy, unspecified, not intractable, without status epilepticus: Secondary | ICD-10-CM | POA: Diagnosis present

## 2019-11-10 DIAGNOSIS — Z833 Family history of diabetes mellitus: Secondary | ICD-10-CM | POA: Diagnosis not present

## 2019-11-10 DIAGNOSIS — M5126 Other intervertebral disc displacement, lumbar region: Secondary | ICD-10-CM | POA: Diagnosis present

## 2019-11-10 DIAGNOSIS — M549 Dorsalgia, unspecified: Secondary | ICD-10-CM | POA: Diagnosis present

## 2019-11-10 LAB — CBC WITH DIFFERENTIAL/PLATELET
Abs Immature Granulocytes: 0.02 10*3/uL (ref 0.00–0.07)
Abs Immature Granulocytes: 0.02 10*3/uL (ref 0.00–0.07)
Basophils Absolute: 0.1 10*3/uL (ref 0.0–0.1)
Basophils Absolute: 0.1 10*3/uL (ref 0.0–0.1)
Basophils Relative: 1 %
Basophils Relative: 1 %
Eosinophils Absolute: 0.2 10*3/uL (ref 0.0–0.5)
Eosinophils Absolute: 0.2 10*3/uL (ref 0.0–0.5)
Eosinophils Relative: 2 %
Eosinophils Relative: 2 %
HCT: 36.7 % (ref 36.0–46.0)
HCT: 37.2 % (ref 36.0–46.0)
Hemoglobin: 11.9 g/dL — ABNORMAL LOW (ref 12.0–15.0)
Hemoglobin: 11.9 g/dL — ABNORMAL LOW (ref 12.0–15.0)
Immature Granulocytes: 0 %
Immature Granulocytes: 0 %
Lymphocytes Relative: 45 %
Lymphocytes Relative: 43 %
Lymphs Abs: 4.8 10*3/uL — ABNORMAL HIGH (ref 0.7–4.0)
Lymphs Abs: 4.5 10*3/uL — ABNORMAL HIGH (ref 0.7–4.0)
MCH: 25.9 pg — ABNORMAL LOW (ref 26.0–34.0)
MCH: 25.9 pg — ABNORMAL LOW (ref 26.0–34.0)
MCHC: 32.4 g/dL (ref 30.0–36.0)
MCHC: 32 g/dL (ref 30.0–36.0)
MCV: 79.8 fL — ABNORMAL LOW (ref 80.0–100.0)
MCV: 80.9 fL (ref 80.0–100.0)
Monocytes Absolute: 0.6 10*3/uL (ref 0.1–1.0)
Monocytes Absolute: 0.6 10*3/uL (ref 0.1–1.0)
Monocytes Relative: 6 %
Monocytes Relative: 6 %
Neutro Abs: 5 10*3/uL (ref 1.7–7.7)
Neutro Abs: 5 10*3/uL (ref 1.7–7.7)
Neutrophils Relative %: 46 %
Neutrophils Relative %: 48 %
Platelets: 290 10*3/uL (ref 150–400)
Platelets: 291 10*3/uL (ref 150–400)
RBC: 4.6 MIL/uL (ref 3.87–5.11)
RBC: 4.6 MIL/uL (ref 3.87–5.11)
RDW: 14.3 % (ref 11.5–15.5)
RDW: 14.5 % (ref 11.5–15.5)
WBC: 10.7 10*3/uL — ABNORMAL HIGH (ref 4.0–10.5)
WBC: 10.4 10*3/uL (ref 4.0–10.5)
nRBC: 0 % (ref 0.0–0.2)
nRBC: 0 % (ref 0.0–0.2)

## 2019-11-10 LAB — BASIC METABOLIC PANEL
Anion gap: 9 (ref 5–15)
BUN: 13 mg/dL (ref 6–20)
CO2: 29 mmol/L (ref 22–32)
Calcium: 8.5 mg/dL — ABNORMAL LOW (ref 8.9–10.3)
Chloride: 98 mmol/L (ref 98–111)
Creatinine, Ser: 1.01 mg/dL — ABNORMAL HIGH (ref 0.44–1.00)
GFR calc Af Amer: >60 mL/min (ref >60)
GFR calc non Af Amer: >60 mL/min (ref >60)
Glucose, Bld: 202 mg/dL — ABNORMAL HIGH (ref 70–99)
Potassium: 3.5 mmol/L (ref 3.5–5.1)
Sodium: 136 mmol/L (ref 135–145)

## 2019-11-10 LAB — LIPID PANEL
Cholesterol: 163 mg/dL (ref 0–200)
HDL: 38 mg/dL — ABNORMAL LOW (ref >40)
LDL Cholesterol: 103 mg/dL — ABNORMAL HIGH (ref 0–99)
Total CHOL/HDL Ratio: 4.3 RATIO
Triglycerides: 108 mg/dL (ref <150)
VLDL: 22 mg/dL (ref 0–40)

## 2019-11-10 LAB — GLUCOSE, CAPILLARY
Glucose-Capillary: 202 mg/dL — ABNORMAL HIGH (ref 70–99)
Glucose-Capillary: 212 mg/dL — ABNORMAL HIGH (ref 70–99)
Glucose-Capillary: 182 mg/dL — ABNORMAL HIGH (ref 70–99)
Glucose-Capillary: 242 mg/dL — ABNORMAL HIGH (ref 70–99)

## 2019-11-10 MED ORDER — CHLORHEXIDINE GLUCONATE CLOTH 2 % EX PADS
6.0000 | MEDICATED_PAD | Freq: Every day | CUTANEOUS | Status: DC
  Administered 2019-11-10 – 2019-11-11 (×2): 6 via TOPICAL

## 2019-11-10 MED ORDER — LEVETIRACETAM 500 MG PO TABS
500.0000 mg | ORAL_TABLET | Freq: Two times a day (BID) | ORAL | Status: DC
  Administered 2019-11-10 – 2019-11-13 (×7): 500 mg via ORAL
  Filled 2019-11-10 (×6): qty 1

## 2019-11-10 MED ORDER — RIVAROXABAN 20 MG PO TABS
20.0000 mg | ORAL_TABLET | Freq: Every day | ORAL | Status: DC
  Administered 2019-11-10 – 2019-11-12 (×3): 20 mg via ORAL
  Filled 2019-11-10 (×4): qty 1

## 2019-11-10 NOTE — Care Management Obs Status (Signed)
[place] NOTIFICATION   Patient Details  Name: Makayla Frazier MRN: [id number] Date of Birth: [birth year]   Medicare Observation Status Notification Given:  Yes    Carles Collet, RN 11/10/2019, 12:00 PM

## 2019-11-10 NOTE — Evaluation (Signed)
Occupational Therapy Evaluation Patient Details Name: Makayla Frazier MRN: [id number] DOB: October 04, 1971 Today's Date: 11/10/2019    History of Present Illness 48 yo female with onset of syncopal episode was admitted, now has been given EEG with no seizure activity found.  MRI found C7-T1 disc protrusion with ventral cord pressure, has chronic PE's, unconfirmed L femoral neck fracture.  Referred to PT for weakness on R LE.  PMHx:  anterolisthesis L5-S1, hepatic steatosis, atherosclerosis, lumbar foraminal narrowing L4-S1    Clinical Impression   Pt with decline in function and safety with ADLs and ADL mobility with impaired strength, balance, endurance, sensation and coordination. Pt reports that PTA, she lived at home with her husband and was independent with ADLs/selfcare, home mgt and was driving and no AD for mobility. Pt currently requires min A for UB ADLs, max - total A for LB ADLs, total A for toileting and reports more weakness on R side with impaired sensation of R LE(numbness, heaviness). Pt sat EOB x 10 minutes for functional tasks with BP of 113/80. Pt would benefit from acute OT services to address impairments to maximize level of function and sadety    Follow Up Recommendations  CIR    Equipment Recommendations  None recommended by OT;Other (comment)(TBD at next venue of care)    Recommendations for Other Services       Precautions / Restrictions Precautions Precautions: Fall;Back Precaution Comments: instructed body mechanics Restrictions Weight Bearing Restrictions: No      Mobility Bed Mobility Overal bed mobility: Needs Assistance Bed Mobility: Sidelying to Sit;Sit to Sidelying   Sidelying to sit: Mod assist     Sit to sidelying: Mod assist General bed mobility comments: mod A to elevate trunk and for LEs back onto bed, [place] eof bed pad and rails as well  Transfers Overall transfer level: Needs assistance Equipment used: Rolling walker (2 wheeled);1 person  hand held assist Transfers: Sit to/from Stand Sit to Stand: Mod assist         General transfer comment: not tested. Pt is mod A per PT note for sit - stand    Balance Overall balance assessment: Needs assistance Sitting-balance support: Feet supported;Bilateral upper extremity supported Sitting balance-Leahy Scale: Fair Sitting balance - Comments: pt sat EOB x 10 minutes for functional tasks. BP 113/80 taken by RN   Standing balance support: Bilateral upper extremity supported;During functional activity Standing balance-Leahy Scale: Poor                             ADL either performed or assessed with clinical judgement   ADL Overall ADL's : Needs assistance/impaired Eating/Feeding: Independent;Sitting   Grooming: Wash/dry hands;Wash/dry face;Min guard;Sitting   Upper Body Bathing: Minimal assistance;Sitting   Lower Body Bathing: Maximal assistance   Upper Body Dressing : Minimal assistance;Sitting   Lower Body Dressing: Total assistance     Toilet Transfer Details (indicate cue type and reason): NT Toileting- Clothing Manipulation and Hygiene: Total assistance;Bed level         General ADL Comments: pt with significant weakness on R side with impaired sensation of R LE(numbness, heaviness)     Vision Baseline Vision/History: Wears glasses Wears Glasses: At all times Patient Visual Report: No change from baseline       Perception     Praxis      Pertinent Vitals/Pain Pain Assessment: 0-10 Pain Score: 5  Pain Location: R UE and LE after standing Pain Intervention(s): Limited  activity within patient's tolerance;Monitored during session;RN gave pain meds during session;Repositioned     Hand Dominance Right   Extremity/Trunk Assessment Upper Extremity Assessment Upper Extremity Assessment: Generalized weakness   Lower Extremity Assessment Lower Extremity Assessment: Defer to PT evaluation RLE Deficits / Details: variable effort to move  RLE RLE Coordination: decreased fine motor;decreased gross motor LLE Deficits / Details: variable effort to move LLE LLE Coordination: decreased fine motor;decreased gross motor   Cervical / Trunk Assessment Cervical / Trunk Assessment: Other exceptions(has degen changes in lumbar spine and cord compres c-spine) Cervical / Trunk Exceptions: degenerative changes lumbar spine, c-spine has ventral cord compression at C7-T1   Communication Communication Communication: Expressive difficulties   Cognition Arousal/Alertness: Awake/alert Behavior During Therapy: Flat affect Overall Cognitive Status: Within Functional Limits for tasks assessed                                    General Comments      Exercises    Shoulder Instructions      Home Living Family/patient expects to be discharged to:: Private residence Living Arrangements: Spouse/significant other Available Help at Discharge: Family;Available PRN/intermittently;Available 24 hours/day Type of Home: House Home Access: Stairs to enter [employer] of Steps: 1   Home Layout: One level     Bathroom Shower/Tub: Tub/shower unit;Walk-in shower   Bathroom Toilet: Standard     Home Equipment: Shower seat          Prior Functioning/Environment Level of Independence: Independent        Comments: no recent falls until syncopal episode        OT Problem List: Decreased strength;Impaired balance (sitting and/or standing);Pain;Decreased activity tolerance;Decreased knowledge of use of DME or AE;Impaired UE functional use;Impaired sensation      OT Treatment/Interventions: Self-care/ADL training;DME and/or AE instruction;Therapeutic activities;Balance training;Therapeutic exercise;Patient/family education    OT Goals(Current goals can be found in the care plan section) Acute Rehab OT Goals Patient Stated Goal: to go home and have husband care for her OT Goal Formulation: With patient Time For  Goal Achievement: 11/24/19 Potential to Achieve Goals: Good ADL Goals Pt Will Perform Grooming: with set-up;with supervision;sitting Pt Will Perform Upper Body Bathing: with min guard assist;sitting Pt Will Perform Lower Body Bathing: with mod assist;sitting/lateral leans Pt Will Perform Upper Body Dressing: with min guard assist;sitting Pt Will Transfer to Toilet: with mod assist;with min assist;stand pivot transfer;bedside commode Additional ADL Goal #1: Pt will complete bed mobility min A to sit EOB for ADL/selfcare tasks  OT Frequency: Min 2X/week   Barriers to D/C:            Co-evaluation              AM-PAC OT "6 Clicks" Daily Activity     Outcome Measure Help from another person eating meals?: None Help from another person taking care of personal grooming?: A Little Help from another person toileting, which includes using toliet, bedpan, or urinal?: Total Help from another person bathing (including washing, rinsing, drying)?: A Lot Help from another person to put on and taking off regular upper body clothing?: A Little Help from another person to put on and taking off regular lower body clothing?: Total 6 Click Score: 14   End of Session    Activity Tolerance: No increased pain;Patient limited by fatigue Patient left: in bed;with call bell/phone within reach;with bed alarm set  OT Visit Diagnosis: Other abnormalities  of gait and mobility (R26.89);Muscle weakness (generalized) (M62.81);Pain;Other symptoms and signs involving the nervous system (R29.898) Pain - Right/Left: Right Pain - part of body: Leg;Arm                Time: [id number] OT Time Calculation (min): 25 min Charges:  OT General Charges $OT Visit: 1 Visit OT Evaluation $OT Eval Moderate Complexity: 1 Mod OT Treatments $Self Care/Home Management : 8-22 mins   Britt Bottom 11/10/2019, 1:22 PM

## 2019-11-10 NOTE — Progress Notes (Signed)
Pt did have MRI this am. This RN went with pt and pt has just returned back to the unit about 5 minutes ago.   Eleanora Neighbor, RN

## 2019-11-10 NOTE — Procedures (Signed)
Patient Name: Makayla Frazier  MRN: [id number]  Epilepsy Attending: Lora Havens  Referring Physician/Provider: Dr. Mitzi Hansen Date: 11/10/2019 Duration: 25.32 minutes  Patient history: 48 year old female with transient loss of consciousness.  EEG to evaluate for seizures.  Level of alertness: Awake, sleep  AEDs during EEG study: Keppra, topiramate, pregabalin  Technical aspects: This EEG study was done with scalp electrodes positioned according to the 10-20 International system of electrode placement. Electrical activity was acquired at a sampling rate of 500Hz  and reviewed with a high frequency filter of 70Hz  and a low frequency filter of 1Hz . EEG data were recorded continuously and digitally stored.   Description: During awake state,the posterior dominant rhythm consists of 8 Hz activity of moderate voltage (25-35 uV) seen predominantly in posterior head regions, symmetric and reactive to eye opening and eye closing.  Sleep was characterized by vertex waves, generalized 4 to 6 Hz theta slowing.  Photic stimulation and hyperventilation were not performed.  IMPRESSION: This study is within normal limits. No seizures or epileptiform discharges were seen throughout the recording.  Hyde Sires Barbra Sarks

## 2019-11-10 NOTE — Progress Notes (Signed)
EEG complete - results pending 

## 2019-11-10 NOTE — Progress Notes (Signed)
BP is now 96/67. RN also called MRI to let them know that pt can go down when they are ready. RN planning to go with pt. Richard with MRI stated that they will be getting her after they finish the emergent cases that they have.   Eleanora Neighbor, RN

## 2019-11-10 NOTE — Progress Notes (Signed)
Pt states that she feels like she has to urinate but cannot. NT did a bladder scan and it shows greater than 746. RN messaged NP on call to make aware.   Eleanora Neighbor, RN

## 2019-11-10 NOTE — Progress Notes (Addendum)
Inpatient Diabetes Program Recommendations  AACE/ADA: New Consensus Statement on Inpatient Glycemic Control (2015)  Target Ranges:  Prepandial:   less than 140 mg/dL      Peak postprandial:   less than 180 mg/dL (1-2 hours)      Critically ill patients:  140 - 180 mg/dL   Lab Results  Component Value Date   GLUCAP 212 (H) 11/10/2019   HGBA1C 9.9 (H) 11/09/2019    Review of Glycemic Control Results for Makayla Frazier, Makayla Frazier (MRN [id number]) as of 11/10/2019 12:40  Ref. Range 11/10/2019 06:53 11/10/2019 11:48  Glucose-Capillary Latest Ref Range: 70 - 99 mg/dL 202 (H) 212 (H)   Diabetes history: Type 2 DM Outpatient Diabetes medications: Trulicity 1.5 mg Q wk Current orders for Inpatient glycemic control: Novolog 0-15 units TID, Novolog 0-5 units QHS  Inpatient Diabetes Program Recommendations:    Consider adding Levemir 10 units QHS.   Addendum@1407 : Attempted to speak with patient. Support person at bedside asked to return at a later time. Will reattempt.   Thanks, Bronson Curb, MSN, RNC-OB Diabetes Coordinator [phone] (8a-5p)

## 2019-11-10 NOTE — Evaluation (Signed)
Physical Therapy Evaluation Patient Details Name: Makayla Frazier MRN: [id number] DOB: [birth year] Today's Date: 11/10/2019   History of Present Illness  48 yo female with onset of syncopal episode was admitted, now has been given EEG with no seizure activity found.  MRI found C7-T1 disc protrusion with ventral cord pressure, has chronic PE's, unconfirmed L femoral neck fracture.  Referred to PT for weakness on R LE.  PMHx:  anterolisthesis L5-S1, hepatic steatosis, atherosclerosis, lumbar foraminal narrowing L4-S1   Clinical Impression  Pt was assessed by PT for mobility, and note her BLE weakness with struggle to use RLE including pain complaints.  Pain per pt is over entire R side, not narrowed down to specific locations.  Pt is appropriate for PT to continue to work on strength and balance, and to increase tolerance for standing and being up OOB.  Follow acutely for these needs, and have asked CIR to assess her for potential appropriateness for admission.    Follow Up Recommendations CIR    Equipment Recommendations  None recommended by PT    Recommendations for Other Services Rehab consult     Precautions / Restrictions Precautions Precautions: Fall;Back(back for pain control) Precaution Comments: instructed body mechanics Restrictions Weight Bearing Restrictions: No      Mobility  Bed Mobility Overal bed mobility: Needs Assistance Bed Mobility: Sidelying to Sit;Sit to Sidelying   Sidelying to sit: Mod assist     Sit to sidelying: Mod assist General bed mobility comments: mod to assist RLE entirely and trunk and back to bed with full help to legs and to move trunk  Transfers Overall transfer level: Needs assistance Equipment used: Rolling walker (2 wheeled);1 person hand held assist Transfers: Sit to/from Stand Sit to Stand: Mod assist         General transfer comment: mod to power up and struggled to get pt to stand fully upright, able to control R knee once  up  Ambulation/Gait Ambulation/Gait assistance: Mod assist           General Gait Details: mod to attempt to stand and step, not successful to actually step  Stairs            Wheelchair Mobility    Modified Rankin (Stroke Patients Only)       Balance Overall balance assessment: Needs assistance Sitting-balance support: Feet supported;Bilateral upper extremity supported Sitting balance-Leahy Scale: Fair     Standing balance support: Bilateral upper extremity supported;During functional activity Standing balance-Leahy Scale: Poor                               Pertinent Vitals/Pain Pain Assessment: 0-10 Pain Score: 7  Pain Location: R UE and LE after standing Pain Intervention(s): Limited activity within patient's tolerance;Monitored during session;Repositioned    Home Living Family/patient expects to be discharged to:: Private residence Living Arrangements: Spouse/significant other Available Help at Discharge: Family;Available PRN/intermittently;Available 24 hours/day(husband works but pt thinks he can take time off) Type of Home: House Home Access: Stairs to enter(one terraced step)   [employer]ical [employer]wer of Steps: 1 Home Layout: One level Home Equipment: None      Prior Function Level of Independence: Independent         Comments: no recent falls until syncopal episode     Hand Dominance   Dominant Hand: Right    Extremity/Trunk Assessment   Upper Extremity Assessment Upper Extremity Assessment: Defer to OT evaluation    Lower Extremity  Assessment Lower Extremity Assessment: RLE deficits/detail;LLE deficits/detail RLE Deficits / Details: variable effort to move RLE RLE Coordination: decreased fine motor;decreased gross motor LLE Deficits / Details: variable effort to move LLE LLE Coordination: decreased fine motor;decreased gross motor    Cervical / Trunk Assessment Cervical / Trunk Assessment: Other exceptions(has degen  changes in lumbar spine and cord compres c-spine) Cervical / Trunk Exceptions: degenerative changes lumbar spine, c-spine has ventral cord compression at C7-T1  Communication   Communication: Expressive difficulties(speech is not clear at times)  Cognition Arousal/Alertness: Lethargic;Awake/alert Behavior During Therapy: Flat affect Overall Cognitive Status: No family/caregiver present to determine baseline cognitive functioning                                 General Comments: pt is mumbling at times and not clear if this is her baseline      General Comments General comments (skin integrity, edema, etc.): pt is helped to stand with support and walker but cannot support on RLE to take a step on side of bed.  Will recommend rehab due to her limitations    Exercises     Assessment/Plan    PT Assessment Patient needs continued PT services  PT Problem List Decreased strength;Decreased range of motion;Decreased activity tolerance;Decreased balance;Decreased mobility;Decreased coordination;Decreased knowledge of use of DME;Decreased safety awareness;Cardiopulmonary status limiting activity;Pain       PT Treatment Interventions DME instruction;Gait training;Stair training;Functional mobility training;Therapeutic activities;Balance training;Therapeutic exercise;Neuromuscular re-education;Patient/family education    PT Goals (Current goals can be found in the Care Plan section)  Acute Rehab PT Goals Patient Stated Goal: to go home and have husband care for her PT Goal Formulation: With patient Time For Goal Achievement: 11/24/19 Potential to Achieve Goals: Good    Frequency Min 3X/week   Barriers to discharge Inaccessible home environment;Decreased caregiver support home alone when husband is working, step to enter    [employer] PT "6 Clicks" Mobility  Outcome Measure Help needed turning from your back to your side while in a flat bed  without using bedrails?: A Little Help needed moving from lying on your back to sitting on the side of a flat bed without using bedrails?: A Lot Help needed moving to and from a bed to a chair (including a wheelchair)?: A Lot Help needed standing up from a chair using your arms (e.g., wheelchair or bedside chair)?: A Lot Help needed to walk in hospital room?: Total Help needed climbing 3-5 steps with a railing? : Total 6 Click Score: 11    End of Session Equipment Utilized During Treatment: Gait belt Activity Tolerance: Patient limited by fatigue;Patient limited by pain Patient left: in bed;with call bell/phone within reach;with bed alarm set Nurse Communication: Mobility status PT Visit Diagnosis: Unsteadiness on feet (R26.81);Muscle weakness (generalized) (M62.81);History of falling (Z91.81);Pain Pain - Right/Left: Right Pain - part of body: Leg;Knee;Ankle and joints of foot    Time: [id number] PT Time Calculation (min) (ACUTE ONLY): 29 min   Charges:   PT Evaluation $PT Eval Moderate Complexity: 1 Mod PT Treatments $Therapeutic Activity: 8-22 mins       Ramond Dial 11/10/2019, 12:44 PM   Mee Hives, PT MS Acute Rehab Dept. Number: [place] [facility] [place]

## 2019-11-10 NOTE — Consult Note (Addendum)
Neurology Consultation  Reason for Consult: Right leg weakness and urinary retention Referring Physician: Alma Friendly  CC: Right leg weakness and urinary retention  History is obtained from: Patient-chart  HPI: Makayla Frazier is a 48 y.o. female with history of thyroid disease, syncope, PE on Xarelto, neuropathy, major depression, hypertension, hyperlipidemia, BV, and asthma.  Patient was admitted on 11/09/2019.  Patient on the day was lifting up a case of water bottles and then lost consciousness.  Family who was nearby, did not see her fall, but saw her immediately afterwards with no seizure-like activity.  EMS was called at that time, patient woke with pain in her right back and numbness and weakness involving her right leg.  Per notes she has been extensively evaluated for her conditions including a loop recorder.  She reports that the episodes were ultimately attributed to seizures.  While in the hospital it was noted that patient was having urinary retention with greater than 833 cc of urine.  This morning per nurses note she felt like she had to urinate and then again bladder scan showed 746 cc of urine. At this time patient currently has a Foley.  Still states that she has decreased sensation on her right leg in addition having inability to move her right leg.  She has no saddle anesthesia but reports stool incontinence.  Upon further questioning, stool incontinence has been off and on ongoing for last 2 years.  Patient states that she has been on Keppra for quite a while.  She states it does cause her some nausea but not significant.  Husband at bedside states that she has been very well controlled with the [place].   Work up that has been done: MRI of brain, lumbar spine, thoracic spine, CT L-spine, CT head  Past Medical History:  Diagnosis Date  . Anemia   . Arthritis    Left leg  . Asthma   . BV (bacterial vaginosis) 2012  . Complication of anesthesia    Seizures in PACU  after surgery 3/15  . DM (diabetes mellitus) ([place])    diagnosed 2009  . DVT (deep venous thrombosis) ([place]) 2011   pt had IUD; also 05/2012  . Dysfunctional uterine bleeding    Seen by Dr. Leo Grosser, GYN; pending hysterectomy as of 07/2012  . H/O hypercoagulable state    2/2 contraception  . Headache(784.0)    migraines  . Herpes simplex without mention of complication 0000000   HSV-2  . HLD (hyperlipidemia)   . Hypertension   . Infection    UTI  . Labial lesion 2013  . Major depression    Hx suicide attempt in 2009, [facility] admissions  . Mastodynia   . Neuromuscular disorder (HCC)    neuropathy  . Neuropathy   . Obesity   . Oligomenorrhea 2011  . PE (pulmonary embolism)    2009 - on oral contraception; multiple  . Post - coital bleeding 2012  . Seizures ([place]) 2015   last episode May 2015  . Sleep apnea   . SORE THROAT 08/10/2010   Qualifier: Diagnosis of  By: Vanessa Kick MD, Saralyn Pilar    . Status post placement of implantable loop recorder   . Stroke [facility])    TIA 2013  . Syncopal episodes    unknown etiology  . Thyroid disease    hypothyroidism (pt denies)  . Vaginal Pap smear, abnormal   . Venous insufficiency   . Vitamin D deficiency    unknown to pt.  Family History  Problem Relation Age of Onset  . Melanoma Father 15  . Diabetes Father   . Hypertension Father   . Kidney disease Father   . Ulcerative colitis Mother 22  . Diabetes Mother   . Hypertension Mother   . Breast cancer Paternal Grandmother   . Cancer Paternal Grandmother   . Diabetes type II Maternal Grandmother        deceased 37  . Diabetes Maternal Grandmother   . Pulmonary embolism Paternal Grandfather   . Stroke Maternal Grandfather    Social History:   reports that she has never smoked. She has never used smokeless tobacco. She reports that she does not drink alcohol or use drugs.  Medications  Current Facility-Administered Medications:  .  0.9 %  sodium chloride infusion, 250 mL,  Intravenous, PRN, Opyd, Ilene Qua, MD .  acetaminophen (TYLENOL) tablet 650 mg, 650 mg, Oral, Q6H PRN, 650 mg at 11/09/19 0600 **OR** acetaminophen (TYLENOL) suppository 650 mg, 650 mg, Rectal, Q6H PRN, Opyd, Timothy S, MD .  albuterol (PROVENTIL) (2.5 MG/3ML) 0.083% nebulizer solution 3 mL, 3 mL, Inhalation, Q6H PRN, Opyd, Timothy S, MD .  amLODipine (NORVASC) tablet 10 mg, 10 mg, Oral, QHS, Opyd, Ilene Qua, MD, 10 mg at 11/09/19 0214 .  atorvastatin (LIPITOR) tablet 10 mg, 10 mg, Oral, q1800, Opyd, Ilene Qua, MD, 10 mg at 11/09/19 1716 .  Chlorhexidine Gluconate Cloth 2 % PADS 6 each, 6 each, Topical, Daily, Alma Friendly, MD, 6 each at 11/10/19 1133 .  escitalopram (LEXAPRO) tablet 10 mg, 10 mg, Oral, QHS, Opyd, Ilene Qua, MD, 10 mg at 11/09/19 2258 .  insulin aspart (novoLOG) injection 0-15 Units, 0-15 Units, Subcutaneous, TID WC, Alma Friendly, MD, 5 Units at 11/10/19 1213 .  insulin aspart (novoLOG) injection 0-5 Units, 0-5 Units, Subcutaneous, QHS, Opyd, Ilene Qua, MD, 3 Units at 11/09/19 0216 .  levETIRAcetam (KEPPRA) tablet 500 mg, 500 mg, Oral, BID, Alma Friendly, MD, 500 mg at 11/10/19 1213 .  lisinopril (ZESTRIL) tablet 2.5 mg, 2.5 mg, Oral, Daily, Opyd, Ilene Qua, MD, 2.5 mg at 11/10/19 1028 .  mometasone-formoterol (DULERA) 200-5 MCG/ACT inhaler 2 puff, 2 puff, Inhalation, BID, Opyd, Ilene Qua, MD, 2 puff at 11/09/19 2032 .  montelukast (SINGULAIR) tablet 10 mg, 10 mg, Oral, QHS, Opyd, Ilene Qua, MD, 10 mg at 11/09/19 2258 .  morphine 2 MG/ML injection 2-4 mg, 2-4 mg, Intravenous, Q4H PRN, Opyd, Ilene Qua, MD, 2 mg at 11/09/19 0941 .  ondansetron (ZOFRAN) tablet 4 mg, 4 mg, Oral, Q6H PRN **OR** ondansetron (ZOFRAN) injection 4 mg, 4 mg, Intravenous, Q6H PRN, Opyd, Ilene Qua, MD, 4 mg at 11/10/19 0418 .  polyethylene glycol (MIRALAX / GLYCOLAX) packet 17 g, 17 g, Oral, Daily PRN, Opyd, Timothy S, MD .  pregabalin (LYRICA) capsule 100 mg, 100 mg, Oral, TID, Opyd,  Ilene Qua, MD, 100 mg at 11/10/19 1028 .  rivaroxaban (XARELTO) tablet 20 mg, 20 mg, Oral, Q supper, Alma Friendly, MD .  sodium chloride flush (NS) 0.9 % injection 3 mL, 3 mL, Intravenous, Q12H, Opyd, Timothy S, MD .  sodium chloride flush (NS) 0.9 % injection 3 mL, 3 mL, Intravenous, Q12H, Opyd, Ilene Qua, MD, 3 mL at 11/09/19 0941 .  sodium chloride flush (NS) 0.9 % injection 3 mL, 3 mL, Intravenous, PRN, Opyd, Ilene Qua, MD .  topiramate (TOPAMAX) tablet 25 mg, 25 mg, Oral, QHS, Opyd, Ilene Qua, MD, 25 mg at 11/09/19 2258  Exam: Current vital signs: BP 113/80   Pulse 81   Temp 98.3 F (36.8 C) (Oral)   Resp 18   Ht 5' (1.524 m)   Wt 75 kg   LMP 11/06/2012   SpO2 96%   BMI 32.29 kg/m  Vital signs in last 24 hours: Temp:  [97.8 F (36.6 C)-98.4 F (36.9 C)] 98.3 F (36.8 C) (11/30 0900) Pulse Rate:  [71-83] 81 (11/30 1025) Resp:  [14-18] 18 (11/30 0900) BP: (85-113)/(59-80) 113/80 (11/30 1025) SpO2:  [95 %-100 %] 96 % (11/30 1025) Weight:  [75 kg] 75 kg (11/30 0442)  ROS: No ability unable to obtain due to altered mental status.   General ROS: negative for - chills, fatigue, fever, night sweats, weight gain or weight loss Psychological ROS: negative for - behavioral disorder, hallucinations, memory difficulties, mood swings or suicidal ideation Ophthalmic ROS: negative for - blurry vision, double vision, eye pain or loss of vision ENT ROS: negative for - epistaxis, nasal discharge, oral lesions, sore throat, tinnitus or vertigo Respiratory ROS: negative for - cough, hemoptysis, shortness of breath or wheezing Cardiovascular ROS: negative for - chest pain, dyspnea on exertion, edema or irregular heartbeat Gastrointestinal ROS: negative for - abdominal pain, diarrhea, hematemesis, nausea/vomiting or stool incontinence Genito-Urinary ROS: Positive for -urinary retention Musculoskeletal ROS: Positive for -muscular weakness Neurological ROS: as noted in  HPI Dermatological ROS: negative for rash and skin lesion changes   Physical Exam   Constitutional: Appears well-developed and well-nourished.  Psych: Flat affect Eyes: No scleral injection HENT: No OP obstrucion Head: Normocephalic.  Cardiovascular: Normal rate and regular rhythm.  Respiratory: Effort normal, non-labored breathing GI: Soft.  No distension. There is no tenderness.  Skin: WDI  Neuro: Mental Status: Patient is awake, alert, oriented to person, place, month, year, and situation. Patient is able to give a clear and coherent history. No signs of aphasia or neglect Cranial Nerves: II: Visual Fields are full.  III,IV, VI: EOMI without ptosis or diploplia. Pupils equal, round and reactive to light V: Facial sensation is symmetric to temperature VII: Facial movement is symmetric.  VIII: hearing is intact to voice X: Palat elevates symmetrically XI: Shoulder shrug is symmetric. XII: tongue is midline without atrophy or fasciculations.  Motor: Tone is normal. Bulk is normal. 5/5 strength with the exception of the right leg.  Patient is unable to move the right leg.  Noted however that there is a positive Hoover's test.  It also should be noted initially on the right arm flexion there was some give way strength but this dissipated the more I tested it. Sensory: Sensation is stated to be decreased on the right arm right leg Deep Tendon Reflexes: 2+ and symmetric in the biceps and no patellae reflex bilaterally.  Plantars: Toes are downgoing bilaterally.  Cerebellar: FNF and intact bilaterally with heel-knee-shin smooth with left leg  Rectal exam-decreased rectal tone.  Labs I have reviewed labs in epic and the results pertinent to this consultation are: CBC    Component Value Date/Time   WBC 10.4 11/10/2019 0454   RBC 4.60 11/10/2019 0454   HGB 11.9 (L) 11/10/2019 0454   HGB 12.2 12/26/2012 1318   HCT 37.2 11/10/2019 0454   HCT 37.4 12/26/2012 1318   PLT 291  11/10/2019 0454   PLT 347 12/26/2012 1318   MCV 80.9 11/10/2019 0454   MCV 78.6 (L) 12/26/2012 1318   MCH 25.9 (L) 11/10/2019 0454   MCHC 32.0 11/10/2019 0454   RDW 14.5 11/10/2019  0454   RDW 15.1 (H) 12/26/2012 1318   LYMPHSABS 4.5 (H) 11/10/2019 0454   LYMPHSABS 3.0 12/26/2012 1318   MONOABS 0.6 11/10/2019 0454   MONOABS 0.7 12/26/2012 1318   EOSABS 0.2 11/10/2019 0454   EOSABS 0.2 12/26/2012 1318   BASOSABS 0.1 11/10/2019 0454   BASOSABS 0.1 12/26/2012 1318    CMP     Component Value Date/Time   NA 136 11/10/2019 0454   NA 139 12/25/2018 1537   NA 136 12/26/2012 1318   K 3.5 11/10/2019 0454   K 4.0 12/26/2012 1318   CL 98 11/10/2019 0454   CL 100 12/26/2012 1318   CO2 29 11/10/2019 0454   CO2 27 12/26/2012 1318   GLUCOSE 202 (H) 11/10/2019 0454   GLUCOSE 271 (H) 12/26/2012 1318   BUN 13 11/10/2019 0454   BUN 8 12/25/2018 1537   BUN 7.0 12/26/2012 1318   CREATININE 1.01 (H) 11/10/2019 0454   CREATININE 0.77 05/09/2016 1305   CREATININE 0.9 12/26/2012 1318   CALCIUM 8.5 (L) 11/10/2019 0454   CALCIUM 9.3 12/26/2012 1318   PROT 8.2 (H) 11/08/2019 2116   PROT 7.6 12/26/2012 1318   ALBUMIN 3.8 11/08/2019 2116   ALBUMIN 3.1 (L) 12/26/2012 1318   AST 36 11/08/2019 2116   AST 18 12/26/2012 1318   ALT 44 11/08/2019 2116   ALT 33 12/26/2012 1318   ALKPHOS 64 11/08/2019 2116   ALKPHOS 60 12/26/2012 1318   BILITOT 0.5 11/08/2019 2116   BILITOT 0.31 12/26/2012 1318   GFRNONAA >60 11/10/2019 0454   GFRAA >60 11/10/2019 0454    Lipid Panel     Component Value Date/Time   CHOL 163 11/10/2019 0454   TRIG 108 11/10/2019 0454   HDL 38 (L) 11/10/2019 0454   CHOLHDL 4.3 11/10/2019 0454   VLDL 22 11/10/2019 0454   LDLCALC 103 (H) 11/10/2019 0454     Imaging I have reviewed the images obtained:  CT-scan of the brain-normal study  MRI examination of the brain-normal MRI appearance of brain with no acute or focal lesion to explain patient's symptoms   MRI of  lumbar spine and thoracic spine-no thoracic or lumbar spinal canal or neural foraminal stenosis.  Small central disc protrusion at C7-T1 indenting the ventral spinal cord.  No associated spinal canal stenosis.  Moderate low lumbar facet arthrosis, which may be a source of low back pain  EEG-this study was within normal limits.  No seizures or epileptiform discharges were noted  Etta Quill PA-C [facility] [phone]  M-F  (9:00 am- 5:00 PM)  11/10/2019, 1:15 PM    Attending addendum Patient seen and examined Agree with history and physical documented above. I have independently examined the patient. Independently reviewed imaging  Assessment:  48 year old female with syncopal episode and no seizure-like activity during this episode.  Post episode patient stated she had decreased to no strength in her right leg and decreased sensation in her right leg.  During hospitalization was also noted patient had urinary retention.  Thus far patient has had a neuro axis MRI with no findings to explain patient's right leg symptoms.  Exam does show some functional components and decreased effort with exam in the lower extremity.   It should be noted that Zofran can cause urinary retention in 5% of patients when taken p.o. She does have decreased rectal tone at the anal sphincter, but reports that the incontinence has been an issue for many years now.  Impression: -Right leg decreased sensation and  weakness-brain, thoracic and lumbar imaging unrevealing and exam has functional component-would suspect a nonorganic etiology here. -Urinary retention-question side effect of Zofran. -Bowel incontinence-has been an ongoing issue for many years.  Needs outpatient follow-up. -Possible breakthrough seizures  Recommendations: -Agree with physical therapy -DC Zofran -Consider urology consultation-has a history of a bladder mesh and some other urological procedures in the past. -She has a  outpatient appointment with [facility] set up for later in the month-she should follow-up as previously planned. -Continue Keppra 500 twice daily. -She is on Topamax for headaches-25 mg at bedtime-continue. -She is also on Lyrica for pain-continue home dose.  -- Amie Portland, MD [facility] Pager: [phone] If 7pm to 7am, please call on call as listed on AMION.

## 2019-11-10 NOTE — Progress Notes (Deleted)
EEG complete - results pending 

## 2019-11-10 NOTE — Progress Notes (Addendum)
PROGRESS NOTE  Makayla Frazier [id number] DOB: September 09, 1971 DOA: 11/08/2019 PCP: Harvie Heck, MD  HPI/Recap of past 24 hours: HPI from Dr Roxan Hockey is a 48 y.o. female with medical history significant for seizure disorder, PE on Xarelto, hypertension, type 2 diabetes mellitus, and asthma, now presenting to emergency department after a transient loss of consciousness and reports pain in the right side of her back as well as new right leg numbness and weakness.  Patient reports that she been in her usual state of health and was having an uneventful day when she was lifting up a case of water bottles and then lost consciousness.  Family was nearby, did not see her fall, but saw her immediately afterwards with no seizure-like activity.  In the ED, patient noted to be tachycardic with hypertensive crisis, SBP in the 200s. Head CT and cervical spine CT are negative for acute findings. CT chest/abdomen/pelvis in the ED which is notable for suspected chronic PE involving the left lower lobar artery without acute PE or right heart strain.  Labs notable for glucose of 390, UDS negative.  Patient admitted for further management.    Today, patient still unable to move her right lower extremity, able to wiggle RLE toes, noted to have urinary retention overnight, with Foley catheter placed draining about 700 mils of urine.  Episode of hypotension overnight.  Denies any chest pain, shortness of breath, abdominal pain, nausea/vomiting, fever/chills.    Assessment/Plan: Principal Problem:   Transient loss of consciousness Active Problems:   Essential hypertension   Chronic pulmonary embolism (HCC)   Seizure disorder (HCC)   Uncontrolled diabetes mellitus with hyperglycemia (HCC)   Weakness of right leg   Multiple idiopathic pulmonary cysts   Hypertensive urgency  Transient loss of consciousness/syncope ?? due to seizures, rule out cardiac causes Troponin 5-->12, EKG with no acute ST  changes Head CT and cervical spine CT are negative for acute findings. CT chest/abdomen/pelvis in the ED which is notable for suspected chronic PE involving the left lower lobar artery without acute PE or right heart strain Echo with EF of 60 to [id number], grade 1 diastolic dysfunction EEG study within normal limits MRI brain unremarkable Neurology consulted, appreciate recs Telemetry, frequent neuro checks  Right leg weakness and numbness/??Todds paralysis Presents after transient LOC with fall and reports right-sided back pain with new right leg numbness and weakness MRI brain unremarkable MRI T- and L-spine showed moderate lower lumbar facet arthrosis, small central disc protrusion at C7-T1 indenting the ventral spinal cord, no associated spinal canal stenosis  Neurology consulted, appreciate recs PT/OT  Acute urinary retention Unknown etiology, previously never had any episode Continue Foley, plan for voiding trial  Hypertension Hypotensive episode noted last night Presented with hypertensive crisis Continue Norvasc, lisinopril, hold chlorthalidone for now, will adjust per BP readings  Seizure disorder  Last seizure was ~6 months ago, and reports adherence with her antiepileptics (noted to be taking Keppra once daily instead of twice daily as it made her sick)  EEG within normal limits Continue Keppra twice daily  Type II DM  A1c 9.9, uncontrolled SSI, Accu-Cheks, hypoglycemic protocol Hold home Trulicity    Chronic PE  Continue Xarelto    Asthma   Continue ICS/LABA, Singulair, and as-needed albuterol  Obesity Lifestyle modification advised         Malnutrition Type:      Malnutrition Characteristics:      Nutrition Interventions:       Estimated body  mass index is 32.29 kg/m as calculated from the following:   Height as of this encounter: 5' (1.524 m).   Weight as of this encounter: 75 kg.     Code Status: Full  Family Communication: None at  bedside  Disposition Plan: To be determined   Consultants:  Neurology  Procedures:  None  Antimicrobials:  None  DVT prophylaxis: Xarelto   Objective: Vitals:   11/10/19 0105 11/10/19 0442 11/10/19 0900 11/10/19 1025  BP: 96/67 105/68 105/70 113/80  Pulse: 83 71 80 81  Resp: 15 16 18    Temp: 97.8 F (36.6 C) 98.4 F (36.9 C) 98.3 F (36.8 C)   TempSrc: Oral Oral Oral   SpO2: 95% 97% 96% 96%  Weight:  75 kg    Height:        Intake/Output Summary (Last 24 hours) at 11/10/2019 1217 Last data filed at 11/10/2019 0900 Gross per 24 hour  Intake 724.58 ml  Output 2000 ml  Net -1275.42 ml   Filed Weights   11/08/19 2320 11/10/19 0442  Weight: 73.9 kg 75 kg    Exam:  General: NAD   Cardiovascular: S1, S2 present  Respiratory: CTAB  Abdomen: Soft, nontender, nondistended, bowel sounds present  Musculoskeletal: No bilateral pedal edema noted  Skin: Normal  Psychiatry: Normal mood  Neurology: Unable to lift right lower extremity, 4/5 strength in left upper extremity, all other extremities 5/5 with no other obvious focal neurologic deficits noted    Data Reviewed: CBC: Recent Labs  Lab 11/08/19 2116 11/09/19 0603 11/09/19 2326 11/10/19 0454  WBC 10.0 10.7* 10.7* 10.4  NEUTROABS 4.4  --  5.0 5.0  HGB 14.0 12.8 11.9* 11.9*  HCT 42.5 40.5 36.7 37.2  MCV 79.3* 80.5 79.8* 80.9  PLT 314 312 290 [id number]   Basic Metabolic Panel: Recent Labs  Lab 11/08/19 2116 11/09/19 0603 11/10/19 0454  NA 133* 135 136  K 4.0 3.9 3.5  CL 95* 97* 98  CO2 26 28 29   GLUCOSE 390* 290* 202*  BUN 9 8 13   CREATININE 0.82 0.72 1.01*  CALCIUM 9.7 9.3 8.5*   GFR: Estimated Creatinine Clearance: 61.6 mL/min (A) (by C-G formula based on SCr of 1.01 mg/dL (H)). Liver Function Tests: Recent Labs  Lab 11/08/19 2116  AST 36  ALT 44  ALKPHOS 64  BILITOT 0.5  PROT 8.2*  ALBUMIN 3.8   No results for input(s): LIPASE, AMYLASE in the last 168 hours. No results for  input(s): AMMONIA in the last 168 hours. Coagulation Profile: No results for input(s): INR, PROTIME in the last 168 hours. Cardiac Enzymes: No results for input(s): CKTOTAL, CKMB, CKMBINDEX, TROPONINI in the last 168 hours. BNP (last 3 results) No results for input(s): PROBNP in the last 8760 hours. HbA1C: Recent Labs    11/09/19 0603  HGBA1C 9.9*   CBG: Recent Labs  Lab 11/09/19 1138 11/09/19 1640 11/09/19 2055 11/10/19 0653 11/10/19 1148  GLUCAP 271* 194* 191* 202* 212*   Lipid Profile: Recent Labs    11/10/19 0454  CHOL 163  HDL 38*  LDLCALC 103*  TRIG 108  CHOLHDL 4.3   Thyroid Function Tests: No results for input(s): TSH, T4TOTAL, FREET4, T3FREE, THYROIDAB in the last 72 hours. Anemia Panel: No results for input(s): VITAMINB12, FOLATE, FERRITIN, TIBC, IRON, RETICCTPCT in the last 72 hours. Urine analysis:    Component Value Date/Time   COLORURINE STRAW (A) 11/08/2019 2117   APPEARANCEUR CLEAR 11/08/2019 2117   LABSPEC 1.024 11/08/2019 2117  LABSPEC 1.015 12/26/2012 1422   PHURINE 8.0 11/08/2019 2117   GLUCOSEU >=500 (A) 11/08/2019 2117   GLUCOSEU > 1000 mg/dL (A) 07/04/2010 2040   HGBUR NEGATIVE 11/08/2019 2117   HGBUR moderate 07/04/2010 1357   BILIRUBINUR NEGATIVE 11/08/2019 2117   BILIRUBINUR Negative 12/26/2012 [place] 11/08/2019 2117   PROTEINUR NEGATIVE 11/08/2019 2117   UROBILINOGEN 0.2 09/05/2019 1044   NITRITE NEGATIVE 11/08/2019 2117   LEUKOCYTESUR NEGATIVE 11/08/2019 2117   LEUKOCYTESUR Negative 12/26/2012 1422   Sepsis Labs: @LABRCNTIP (procalcitonin:4,lacticidven:4)  ) Recent Results (from the past 240 hour(s))  SARS CORONAVIRUS 2 (TAT 6-24 HRS) Nasopharyngeal Nasopharyngeal Swab     Status: None   Collection Time: 11/09/19 12:06 AM   Specimen: Nasopharyngeal Swab  Result Value Ref Range Status   SARS Coronavirus 2 NEGATIVE NEGATIVE Final    Comment: (NOTE) SARS-CoV-2 target nucleic acids are NOT DETECTED. The  SARS-CoV-2 RNA is generally detectable in upper and lower respiratory specimens during the acute phase of infection. Negative results do not preclude SARS-CoV-2 infection, do not rule out co-infections with other pathogens, and should not be used as the sole basis for treatment or other patient management decisions. Negative results must be combined with clinical observations, patient history, and epidemiological information. The expected result is Negative. Fact Sheet for Patients: SugarRoll.be Fact Sheet for Healthcare Providers: https://www.woods-mathews.com/ This test is not yet approved or cleared by the [place] FDA and  has been authorized for detection and/or diagnosis of SARS-CoV-2 by FDA under an Emergency Use Authorization (EUA). This EUA will remain  in effect (meaning this test can be used) for the duration of the COVID-19 declaration under Section 56 4(b)(1) of the Act, 21 U.S.C. section 360bbb-3(b)(1), unless the authorization is terminated or revoked sooner. Performed at [facility], [place] [address], [place], [place] [address]       Studies: Mr Brain Wo Contrast  Result Date: 11/10/2019 CLINICAL DATA:  Syncope. Personal history of seizures. Transient loss of consciousness. Right leg numbness and weakness. EXAM: MRI HEAD WITHOUT CONTRAST TECHNIQUE: Multiplanar, multiecho pulse sequences of the brain and surrounding structures were obtained without intravenous contrast. COMPARISON:  CT head without contrast 11/08/2019 FINDINGS: Brain: The diffusion-weighted images demonstrate no acute or subacute infarction. Partially empty sella is again noted without significant interval change. Midline structures are otherwise unremarkable. No significant white matter lesions are present. The ventricles are of normal size. No significant extraaxial fluid collection is present. The internal auditory canals are within normal limits. The  brainstem and cerebellum are within normal limits. Vascular: Flow is present in the major intracranial arteries. Skull and upper cervical spine: The craniocervical junction is normal. Upper cervical spine is within normal limits. Marrow signal is unremarkable. Sinuses/Orbits: The paranasal sinuses and mastoid air cells are clear. The globes and orbits are within normal limits. IMPRESSION: Normal MRI appearance the brain. No acute or focal lesion to explain the patient's symptoms. Electronically Signed   By: San Morelle M.D.   On: 11/10/2019 05:10    Scheduled Meds:  amLODipine  10 mg Oral QHS   atorvastatin  10 mg Oral q1800   Chlorhexidine Gluconate Cloth  6 each Topical Daily   chlorthalidone  50 mg Oral QHS   escitalopram  10 mg Oral QHS   insulin aspart  0-15 Units Subcutaneous TID WC   insulin aspart  0-5 Units Subcutaneous QHS   levETIRAcetam  500 mg Oral BID   lisinopril  2.5 mg Oral Daily  mometasone-formoterol  2 puff Inhalation BID   montelukast  10 mg Oral QHS   pregabalin  100 mg Oral TID   rivaroxaban  20 mg Oral Q supper   sodium chloride flush  3 mL Intravenous Q12H   sodium chloride flush  3 mL Intravenous Q12H   topiramate  25 mg Oral QHS    Continuous Infusions:  sodium chloride       LOS: 0 days     Alma Friendly, MD [facility]  If 7PM-7AM, please contact night-coverage [email or web address] 11/10/2019, 12:17 PM

## 2019-11-10 NOTE — Progress Notes (Signed)
Rehab Admissions Coordinator Note:  Patient was screened by Cleatrice Burke for appropriateness for an Inpatient Acute Rehab Consult per PT recs. Medical work up in progress and pt under observation status.I will follow her progress to assist with venue option pending her progress and workup for a diagnosis  .Cleatrice Burke RN MSN 11/10/2019, 1:07 PM  I can be reached at [phone].

## 2019-11-10 NOTE — Progress Notes (Signed)
Foley was placed due to urinary retention. Got out 700 mL with insertion. Pt tolerated procedure well. Pt states that she feels a lot better with foley in to relieve pressure.   Eleanora Neighbor, RN

## 2019-11-10 NOTE — Discharge Instructions (Signed)
Information on my medicine - XARELTO® (Rivaroxaban) ° °This medication education was reviewed with me or my healthcare representative as part of my discharge preparation.  ° °Why was Xarelto® prescribed for you? °Xarelto® was prescribed for you to reduce the risk of a blood clot forming  ° °What do you need to know about xarelto® ? °Take your Xarelto® ONCE DAILY at the same time every day with your evening meal. °If you have difficulty swallowing the tablet whole, you may crush it and mix in applesauce just prior to taking your dose. ° °Take Xarelto® exactly as prescribed by your doctor and DO NOT stop taking Xarelto® without talking to the doctor who prescribed the medication.  Stopping without other stroke prevention medication to take the place of Xarelto® may increase your risk of developing a clot that causes a stroke.  Refill your prescription before you run out. ° °After discharge, you should have regular check-up appointments with your healthcare provider that is prescribing your Xarelto®.  In the future your dose may need to be changed if your kidney function or weight changes by a significant amount. ° °What do you do if you miss a dose? °If you are taking Xarelto® ONCE DAILY and you miss a dose, take it as soon as you remember on the same day then continue your regularly scheduled once daily regimen the next day. Do not take two doses of Xarelto® at the same time or on the same day.  ° °Important Safety Information °A possible side effect of Xarelto® is bleeding. You should call your healthcare provider right away if you experience any of the following: °? Bleeding from an injury or your nose that does not stop. °? Unusual colored urine (red or dark brown) or unusual colored stools (red or black). °? Unusual bruising for unknown reasons. °? A serious fall or if you hit your head (even if there is no bleeding). ° °Some medicines may interact with Xarelto® and might increase your risk of bleeding while on  Xarelto®. To help avoid this, consult your healthcare provider or pharmacist prior to using any new prescription or non-prescription medications, including herbals, vitamins, non-steroidal anti-inflammatory drugs (NSAIDs) and supplements. ° °This website has more information on Xarelto®: www.xarelto.com. ° °

## 2019-11-11 DIAGNOSIS — J984 Other disorders of lung: Secondary | ICD-10-CM

## 2019-11-11 DIAGNOSIS — E1165 Type 2 diabetes mellitus with hyperglycemia: Secondary | ICD-10-CM

## 2019-11-11 DIAGNOSIS — I160 Hypertensive urgency: Secondary | ICD-10-CM

## 2019-11-11 DIAGNOSIS — I2782 Chronic pulmonary embolism: Secondary | ICD-10-CM

## 2019-11-11 DIAGNOSIS — I10 Essential (primary) hypertension: Secondary | ICD-10-CM

## 2019-11-11 LAB — BASIC METABOLIC PANEL
Anion gap: 10 (ref 5–15)
BUN: 11 mg/dL (ref 6–20)
CO2: 25 mmol/L (ref 22–32)
Calcium: 8.3 mg/dL — ABNORMAL LOW (ref 8.9–10.3)
Chloride: 103 mmol/L (ref 98–111)
Creatinine, Ser: 0.82 mg/dL (ref 0.44–1.00)
GFR calc Af Amer: >60 mL/min (ref >60)
GFR calc non Af Amer: >60 mL/min (ref >60)
Glucose, Bld: 185 mg/dL — ABNORMAL HIGH (ref 70–99)
Potassium: 3.7 mmol/L (ref 3.5–5.1)
Sodium: 138 mmol/L (ref 135–145)

## 2019-11-11 LAB — CBC WITH DIFFERENTIAL/PLATELET
Abs Immature Granulocytes: 0.02 10*3/uL (ref 0.00–0.07)
Basophils Absolute: 0.1 10*3/uL (ref 0.0–0.1)
Basophils Relative: 1 %
Eosinophils Absolute: 0.2 10*3/uL (ref 0.0–0.5)
Eosinophils Relative: 2 %
HCT: 36.4 % (ref 36.0–46.0)
Hemoglobin: 11.5 g/dL — ABNORMAL LOW (ref 12.0–15.0)
Immature Granulocytes: 0 %
Lymphocytes Relative: 53 %
Lymphs Abs: 5.3 10*3/uL — ABNORMAL HIGH (ref 0.7–4.0)
MCH: 25.7 pg — ABNORMAL LOW (ref 26.0–34.0)
MCHC: 31.6 g/dL (ref 30.0–36.0)
MCV: 81.4 fL (ref 80.0–100.0)
Monocytes Absolute: 0.6 10*3/uL (ref 0.1–1.0)
Monocytes Relative: 6 %
Neutro Abs: 3.9 10*3/uL (ref 1.7–7.7)
Neutrophils Relative %: 38 %
Platelets: 294 10*3/uL (ref 150–400)
RBC: 4.47 MIL/uL (ref 3.87–5.11)
RDW: 14.3 % (ref 11.5–15.5)
WBC: 10.1 10*3/uL (ref 4.0–10.5)
nRBC: 0 % (ref 0.0–0.2)

## 2019-11-11 LAB — GLUCOSE, CAPILLARY
Glucose-Capillary: 178 mg/dL — ABNORMAL HIGH (ref 70–99)
Glucose-Capillary: 197 mg/dL — ABNORMAL HIGH (ref 70–99)
Glucose-Capillary: 204 mg/dL — ABNORMAL HIGH (ref 70–99)
Glucose-Capillary: 161 mg/dL — ABNORMAL HIGH (ref 70–99)

## 2019-11-11 NOTE — Progress Notes (Signed)
Physical Therapy Treatment Patient Details Name: Makayla Frazier MRN: [id number] DOB: 1971/05/21 Today's Date: 11/11/2019    History of Present Illness 48 yo female with onset of syncopal episode was admitted, now has been given EEG with no seizure activity found.  MRI found C7-T1 disc protrusion with ventral cord pressure, has chronic PE's, unconfirmed L femoral neck fracture.  Referred to PT for weakness on R LE.  PMHx:  anterolisthesis L5-S1, hepatic steatosis, atherosclerosis, lumbar foraminal narrowing L4-S1     PT Comments    Pt was seen with BP noted in supine as 93/61 and sitting 88/60.  Pt is unable to stand up and walk due to low values, and will continue to monitor this during sessions as needed to avoid unsteady standing.  Progress with exercises and OOB to chair to support and mitigate BP changes.  Will continue to recommend Inpt rehab with hosp, and will progress her toward these hosp based goals.   Follow Up Recommendations  CIR     Equipment Recommendations  None recommended by PT    Recommendations for Other Services       Precautions / Restrictions Precautions Precautions: Fall;Back Precaution Comments: instructed body mechanics Restrictions Weight Bearing Restrictions: No    Mobility  Bed Mobility Overal bed mobility: Needs Assistance Bed Mobility: Supine to Sit;Sit to Supine   Sidelying to sit: Min assist Supine to sit: Mod assist Sit to supine: Mod assist   General bed mobility comments: mod to support trunk OOB and mod to lift legs back to bed  Transfers                 General transfer comment: not tested due to BP drop  Ambulation/Gait                 Stairs             Wheelchair Mobility    Modified Rankin (Stroke Patients Only)       Balance Overall balance assessment: Needs assistance Sitting-balance support: Feet supported Sitting balance-Leahy Scale: Fair                                       Cognition Arousal/Alertness: Lethargic Behavior During Therapy: Flat affect Overall Cognitive Status: Within Functional Limits for tasks assessed                                 General Comments: pt is speaking quietly at times and has to be asked to repeat herself      Exercises General Exercises - Lower Extremity Ankle Circles/Pumps: AAROM;5 reps Quad Sets: AAROM;10 reps Gluteal Sets: AAROM;10 reps Heel Slides: AAROM;10 reps Hip ABduction/ADduction: AAROM;10 reps Straight Leg Raises: AAROM;10 reps    General Comments General comments (skin integrity, edema, etc.): pt is checked for BP with supine value of 93/61, pulse 76 and O2 sat 95%.  Sitting BP was 88/60, pulse 83, sat 98%.        Pertinent Vitals/Pain Pain Assessment: No/denies pain Pain Score: 5     Home Living                      Prior Function            PT Goals (current goals can now be found in the care plan section) Acute Rehab PT  Goals Patient Stated Goal: get home, feel better Progress towards PT goals: Progressing toward goals    Frequency    Min 3X/week      PT Plan Current plan remains appropriate    Co-evaluation              AM-PAC PT "6 Clicks" Mobility   Outcome Measure  Help needed turning from your back to your side while in a flat bed without using bedrails?: A Little Help needed moving from lying on your back to sitting on the side of a flat bed without using bedrails?: A Lot Help needed moving to and from a bed to a chair (including a wheelchair)?: A Lot Help needed standing up from a chair using your arms (e.g., wheelchair or bedside chair)?: A Lot Help needed to walk in hospital room?: A Lot Help needed climbing 3-5 steps with a railing? : Total 6 Click Score: 12    End of Session Equipment Utilized During Treatment: Gait belt Activity Tolerance: Patient limited by fatigue;Treatment limited secondary to medical complications  (Comment) Patient left: in bed;with call bell/phone within reach;with bed alarm set Nurse Communication: Mobility status PT Visit Diagnosis: Unsteadiness on feet (R26.81);Muscle weakness (generalized) (M62.81);History of falling (Z91.81);Pain Pain - Right/Left: Right Pain - part of body: Leg;Knee;Ankle and joints of foot     Time: [id number] PT Time Calculation (min) (ACUTE ONLY): 33 min  Charges:  $Therapeutic Exercise: 8-22 mins $Therapeutic Activity: 8-22 mins                    Ramond Dial 11/11/2019, 3:34 PM   Mee Hives, PT MS Acute Rehab Dept. Number: [place] [facility] [place]

## 2019-11-11 NOTE — Progress Notes (Addendum)
Inpatient Diabetes Program Recommendations  AACE/ADA: New Consensus Statement on Inpatient Glycemic Control (2015)  Target Ranges:  Prepandial:   less than 140 mg/dL      Peak postprandial:   less than 180 mg/dL (1-2 hours)      Critically ill patients:  140 - 180 mg/dL   Lab Results  Component Value Date   GLUCAP 178 (H) 11/11/2019   HGBA1C 9.9 (H) 11/09/2019    Review of Glycemic Control Results for Makayla Frazier, Makayla Frazier (MRN [id number]) as of 11/11/2019 09:58  Ref. Range 11/10/2019 11:48 11/10/2019 16:20 11/10/2019 20:56 11/11/2019 07:19  Glucose-Capillary Latest Ref Range: 70 - 99 mg/dL 212 (H) 182 (H) 242 (H) 178 (H)   Diabetes history: Type 2 DM Outpatient Diabetes medications: Trulicity 1.5 mg Q wk Current orders for Inpatient glycemic control: Novolog 0-15 units TID, Novolog 0-5 units QHS  Inpatient Diabetes Program Recommendations:    If to remain inpatient, consider adding Levemir 10 units QHS.   Addendum: Spoke with patient and husband regarding outpatient diabetes management. Patient was on a steroid dose pack several weeks ago. Reviewed patient's current A1c of 9.9%. Explained what a A1c is and what it measures. Also reviewed goal A1c with patient, importance of good glucose control @ home, and blood sugar goals. Reviewed patho of DM, need for improved control, current inpatient trends, impact of steroids to glycemic control, vascular changes and commorbidities.  Patient has a meter but has not been checking glucose. Encouraged to begin checking 2-3 times per day and taking values with her to the next appointment.  Admits to drinking sugary beverages. Reviewed alternatives, carb counting and plate method. Reviewed when to call MD. Patient is scheduled for an appointment with new endocrinologist on 11/17/2019. Patient not interested in outpatient education or meeting with dietitian at this time.    Thanks, Bronson Curb, MSN, RNC-OB Diabetes Coordinator [phone]  (8a-5p)

## 2019-11-11 NOTE — Progress Notes (Signed)
PROGRESS NOTE    Makayla Frazier  [id number] DOB: Apr 11, 1971 DOA: 11/08/2019 PCP: Harvie Heck, MD   Brief Narrative:  HPI from Dr Derenda Mis a 48 y.o.femalewith medical history significant forseizure disorder, PE on Xarelto, hypertension, type 2 diabetes mellitus, and asthma, now presenting to emergency department after a transient loss of consciousness and reports pain in the right side of her back as well as new right leg numbness and weakness. Patient reports that she been in her usual state of health and was having an uneventful day when she was lifting up a case of water bottles and then lost consciousness. Family was nearby,did not see her fall, but saw her immediately afterwards with no seizure-like activity.  In the ED, patient noted to be tachycardic with hypertensive crisis, SBP in the 200s. Head CT and cervical spine CT are negative for acute findings. CT chest/abdomen/pelvis in the ED which is notable for suspected chronic PE involving the left lower lobar artery without acute PE or right heart strain.  Labs notable for glucose of 390, UDS negative.  Patient admitted for further management.   Assessment & Plan:   Principal Problem:   Transient loss of consciousness Active Problems:   Essential hypertension   Chronic pulmonary embolism (HCC)   Seizure disorder (HCC)   Uncontrolled diabetes mellitus with hyperglycemia (HCC)   Weakness of right leg   Multiple idiopathic pulmonary cysts   Hypertensive urgency   Transient loss of consciousness/syncope ?? due to seizures, rule out cardiac causes Troponin 5-->12, EKG with no acute ST changes Head CT and cervical spine CT are negative for acute findings. CT chest/abdomen/pelvis in the ED which is notable for suspected chronic PE involving the left lower lobar artery without acute PE or right heart strain Echo with EF of 60 to [id number], grade 1 diastolic dysfunction EEG study within normal limits MRI brain  unremarkable Neurology consulted, appreciate recs Telemetry, frequent neuro checks  Right leg weakness and numbness/??Todds paralysis Presents after transient LOC with fall and reports right-sided back pain with new right leg numbness and weakness MRI brain unremarkable MRI T- and L-spine showed moderate lower lumbar facet arthrosis, small central disc protrusion at C7-T1 indenting the ventral spinal cord, no associated spinal canal stenosis Neurology consulted, appreciate recs PT/OT  Acute urinary retention Unknown etiology, previously never had any episode Continue Foley, plan for voiding trial  Hypertension Hypotensive episode noted monitor closely, ? complaince Presented with hypertensive crisis Continue Norvasc, lisinopril, hold chlorthalidone for now, will adjust per BP readings  Seizure disorder Last seizure was ~6 months ago, and reports adherence with her antiepileptics (noted to be taking Keppra once daily instead of twice daily as it made her sick) EEG within normal limits Continue Keppra twice daily Appreciate neuro input  Type II DM A1c 9.9, uncontrolled SSI, Accu-Cheks, hypoglycemic protocol Hold home Trulicity  Chronic PE Continue Xarelto  Asthma Continue ICS/LABA, Singulair, and as-needed albuterol  Obesity Lifestyle modification advised   DVT prophylaxis: [id number]   Code Status: full    Code Status Orders  (From admission, onward)         Start     Ordered   11/09/19 0933  Full code  Continuous     11/09/19 0932        Code Status History    Date Active Date Inactive Code Status Order ID Comments User Context   05/12/2015 2111 05/13/2015 1859 Full Code [id number]  Toy Baker, MD Inpatient   02/13/2014 1407  02/14/2014 1754 Full Code [id number]  Earnstine Regal, PA-C Inpatient   07/17/2013 1610 07/17/2013 2236 Full Code [id number]  Thompson Grayer, MD Inpatient   12/18/2012 1705 12/19/2012 2215 Full Code [id number]  Bonney Aid,  RN Inpatient   08/03/2012 2019 08/05/2012 2348 Full Code [id number]  Tsoutis, Mosetta Pigeon, RN Inpatient   Advance Care Planning Activity     Family Communication: none today  Disposition Plan:   pt will remain inpt for continued PT is unstable and not safe for discharge Consults called: None Admission status: Inpatient   Consultants:   neuro  Procedures:  Ct Head Wo Contrast  Result Date: 11/08/2019 CLINICAL DATA:  Syncope, fall. EXAM: CT HEAD WITHOUT CONTRAST TECHNIQUE: Contiguous axial images were obtained from the base of the skull through the vertex without intravenous contrast. COMPARISON:  05/25/2016 FINDINGS: Brain: No acute intracranial abnormality. Specifically, no hemorrhage, hydrocephalus, mass lesion, acute infarction, or significant intracranial injury. Vascular: No hyperdense vessel or unexpected calcification. Skull: No acute calvarial abnormality. Sinuses/Orbits: Visualized paranasal sinuses and mastoids clear. Orbital soft tissues unremarkable. Other: None IMPRESSION: Normal study. Electronically Signed   By: Rolm Baptise M.D.   On: 11/08/2019 23:27   Ct Angio Chest Pe W And/or Wo Contrast  Result Date: 11/08/2019 CLINICAL DATA:  Fall while walking from living room to kitchen, history of seizures, no noted seizure activity, anticoagulated EXAM: CT ANGIOGRAPHY CHEST CT ABDOMEN AND PELVIS WITH CONTRAST TECHNIQUE: Multidetector CT imaging of the chest was performed using the standard protocol during bolus administration of intravenous contrast. Multiplanar CT image reconstructions and MIPs were obtained to evaluate the vascular anatomy. Multidetector CT imaging of the abdomen and pelvis was performed using the standard protocol during bolus administration of intravenous contrast. CONTRAST:  152mL OMNIPAQUE IOHEXOL 350 MG/ML SOLN COMPARISON:  CT abdomen pelvis 10/20/2017, CTA chest 05/12/2015 FINDINGS: CTA CHEST FINDINGS Cardiovascular: Satisfactory opacification the pulmonary arteries to  the segmental level. Stable web-like filling defects are present in branching of the left lower lobar artery. No acute hypoattenuating filling defect is seen. Central pulmonary arteries are normal caliber. Normal caliber of the thoracic aorta. No acute luminal abnormality, periaortic stranding or hemorrhage. Normal 3 vessel branching of the aortic arch. Normal heart size. No pericardial effusion. Mediastinum/Nodes: No enlarged mediastinal, hilar, or axillary lymph nodes. Thyroid gland and thoracic inlet are unremarkable. No acute abnormality of the trachea. Small amount of ingested material within the esophagus. Lungs/Pleura: Numerous pulmonary cysts are similar to prior. Mosaic attenuation within the lungs likely related to imaging during exhalation though a possible air trapping or small airways disease could have a similar appearance. No consolidative opacity. No acute traumatic abnormality of the lung parenchyma. Basilar atelectatic changes are noted. No pneumothorax or effusion. Musculoskeletal: No chest wall abnormality. No acute or significant osseous findings. Prominent vascular channel in the left anterior articular facet of T9 is stable from prior. Review of the MIP images confirms the above findings. CT ABDOMEN and PELVIS FINDINGS Hepatobiliary: No focal hepatic injury or perihepatic hematoma. Diffuse hepatic hypoattenuation compatible with hepatic steatosis. No focal liver abnormality is seen. Patient is post cholecystectomy. Slight prominence of the biliary tree likely related to reservoir effect. No calcified intraductal gallstones. Pancreas: Unremarkable. No pancreatic ductal dilatation or surrounding inflammatory changes. Spleen: Normal arterial enhancement of the spleen. No splenic injury or perisplenic hematoma. Adrenals/Urinary Tract: No adrenal hematoma or suspicious adrenal lesions. Kidneys enhance and excrete symmetrically. No direct renal injury or perirenal hematoma. No suspicious renal  lesions, urolithiasis or hydronephrosis. No  acute bladder injury or other abnormality. Stomach/Bowel: Distal esophagus, stomach and duodenal sweep are unremarkable. No small bowel wall thickening or dilatation. No evidence of obstruction. A normal appendix is visualized. No colonic dilatation or wall thickening. Vascular/Lymphatic: The aorta is normal caliber. No suspicious or enlarged lymph nodes in the included lymphatic chains. Reproductive: Uterus is surgically absent. Retained ovaries. 2.9 cm cyst adjacent the left ovary. No other concerning adnexal lesion. Other: No free fluid or free air. No traumatic abdominal injury. No bowel containing hernias. Musculoskeletal: Multilevel degenerative changes are present in the imaged portions of the spine. Additional degenerative changes in the hips. No acute osseous abnormality or suspicious osseous lesion. Few stable bone islands in the left pelvis. Review of the MIP images confirms the above findings. IMPRESSION: 1. No acute traumatic injury of the chest, abdomen or pelvis. 2. Stable web-like filling defects in the branching of the left lower lobar artery, favored to represent chronic pulmonary emboli. 3. No acute pulmonary arterial filling defects are identified. No evidence of right heart strain. 4. Mosaic attenuation of the lungs may reflect imaging during exhalation or underlying air trapping/small airways disease. 5. 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 Consider nonemergent high-resolution chest CT for further evaluation. 6. Post hysterectomy with retained ovaries. 2.9 cm left adnexal cyst in the left ovary is within physiologic normal for a premenopausal female. 7. Hepatic steatosis. 8. Aortic Atherosclerosis (ICD10-I70.0) Electronically Signed   By: Lovena Le M.D.   On: 11/08/2019 23:42   Ct Cervical Spine Wo Contrast  Result Date: 11/08/2019 CLINICAL DATA:  Syncope, fall EXAM:  CT CERVICAL SPINE WITHOUT CONTRAST TECHNIQUE: Multidetector CT imaging of the cervical spine was performed without intravenous contrast. Multiplanar CT image reconstructions were also generated. COMPARISON:  05/18/2012 FINDINGS: Alignment: No subluxation.  Loss of cervical lordosis. Skull base and vertebrae: No acute fracture. No primary bone lesion or focal pathologic process. Soft tissues and spinal canal: No prevertebral fluid or swelling. No visible canal hematoma. Disc levels: Early spurring. Ossification of the posterior longitudinal ligament. Upper chest: Negative Other: None IMPRESSION: No acute bony abnormality. Electronically Signed   By: Rolm Baptise M.D.   On: 11/08/2019 23:28   Mr Brain Wo Contrast  Result Date: 11/10/2019 CLINICAL DATA:  Syncope. Personal history of seizures. Transient loss of consciousness. Right leg numbness and weakness. EXAM: MRI HEAD WITHOUT CONTRAST TECHNIQUE: Multiplanar, multiecho pulse sequences of the brain and surrounding structures were obtained without intravenous contrast. COMPARISON:  CT head without contrast 11/08/2019 FINDINGS: Brain: The diffusion-weighted images demonstrate no acute or subacute infarction. Partially empty sella is again noted without significant interval change. Midline structures are otherwise unremarkable. No significant white matter lesions are present. The ventricles are of normal size. No significant extraaxial fluid collection is present. The internal auditory canals are within normal limits. The brainstem and cerebellum are within normal limits. Vascular: Flow is present in the major intracranial arteries. Skull and upper cervical spine: The craniocervical junction is normal. Upper cervical spine is within normal limits. Marrow signal is unremarkable. Sinuses/Orbits: The paranasal sinuses and mastoid air cells are clear. The globes and orbits are within normal limits. IMPRESSION: Normal MRI appearance the brain. No acute or focal lesion  to explain the patient's symptoms. Electronically Signed   By: San Morelle M.D.   On: 11/10/2019 05:10   Mr Thoracic Spine Wo Contrast  Result Date: 11/09/2019 CLINICAL DATA:  Back pain with right lower extremity numbness and weakness. EXAM:  MRI THORACIC AND LUMBAR SPINE WITHOUT CONTRAST TECHNIQUE: Multiplanar and multiecho pulse sequences of the thoracic and lumbar spine were obtained without intravenous contrast. COMPARISON:  CT lumbar spine 11/08/2019 FINDINGS: MRI THORACIC SPINE FINDINGS Alignment:  Physiologic. Vertebrae: No fracture, evidence of discitis, or bone lesion. Cord:  Normal signal and morphology. Paraspinal and other soft tissues: Negative. Disc levels: C7-T1: Small central disc protrusion indenting the ventral spinal cord. Otherwise, the thoracic disc levels are normal. MRI LUMBAR SPINE FINDINGS Segmentation:  Stand Alignment:  Normal Vertebrae:  Normal Conus medullaris and cauda equina: Conus extends to the L1 level. Conus and cauda equina appear normal. Paraspinal and other soft tissues: Markedly distended urinary bladder. Cystic focus in the left lower quadrant. Disc levels: L1-2: Normal L2-3: Mild facet hypertrophy. Normal disc. No stenosis. L3-4: Mild facet hypertrophy. Normal disc. No stenosis. L4-5: Moderate facet hypertrophy, right worse than left. Normal disc. No spinal canal or neural foraminal stenosis. L5-S1: Moderate facet hypertrophy. No disc herniation or stenosis. IMPRESSION: 1. No thoracic or lumbar spinal canal or neural foraminal stenosis. 2. Small central disc protrusion at C7-T1 indenting the ventral spinal cord. No associated spinal canal stenosis. 3. Moderate lower lumbar facet arthrosis, which may be a source of local low back pain. Electronically Signed   By: Ulyses Jarred M.D.   On: 11/09/2019 04:17   Mr Lumbar Spine Wo Contrast  Result Date: 11/09/2019 CLINICAL DATA:  Back pain with right lower extremity numbness and weakness. EXAM: MRI THORACIC AND  LUMBAR SPINE WITHOUT CONTRAST TECHNIQUE: Multiplanar and multiecho pulse sequences of the thoracic and lumbar spine were obtained without intravenous contrast. COMPARISON:  CT lumbar spine 11/08/2019 FINDINGS: MRI THORACIC SPINE FINDINGS Alignment:  Physiologic. Vertebrae: No fracture, evidence of discitis, or bone lesion. Cord:  Normal signal and morphology. Paraspinal and other soft tissues: Negative. Disc levels: C7-T1: Small central disc protrusion indenting the ventral spinal cord. Otherwise, the thoracic disc levels are normal. MRI LUMBAR SPINE FINDINGS Segmentation:  Stand Alignment:  Normal Vertebrae:  Normal Conus medullaris and cauda equina: Conus extends to the L1 level. Conus and cauda equina appear normal. Paraspinal and other soft tissues: Markedly distended urinary bladder. Cystic focus in the left lower quadrant. Disc levels: L1-2: Normal L2-3: Mild facet hypertrophy. Normal disc. No stenosis. L3-4: Mild facet hypertrophy. Normal disc. No stenosis. L4-5: Moderate facet hypertrophy, right worse than left. Normal disc. No spinal canal or neural foraminal stenosis. L5-S1: Moderate facet hypertrophy. No disc herniation or stenosis. IMPRESSION: 1. No thoracic or lumbar spinal canal or neural foraminal stenosis. 2. Small central disc protrusion at C7-T1 indenting the ventral spinal cord. No associated spinal canal stenosis. 3. Moderate lower lumbar facet arthrosis, which may be a source of local low back pain. Electronically Signed   By: Ulyses Jarred M.D.   On: 11/09/2019 04:17   Ct Abdomen Pelvis W Contrast  Result Date: 11/08/2019 CLINICAL DATA:  Fall while walking from living room to kitchen, history of seizures, no noted seizure activity, anticoagulated EXAM: CT ANGIOGRAPHY CHEST CT ABDOMEN AND PELVIS WITH CONTRAST TECHNIQUE: Multidetector CT imaging of the chest was performed using the standard protocol during bolus administration of intravenous contrast. Multiplanar CT image reconstructions and  MIPs were obtained to evaluate the vascular anatomy. Multidetector CT imaging of the abdomen and pelvis was performed using the standard protocol during bolus administration of intravenous contrast. CONTRAST:  154mL OMNIPAQUE IOHEXOL 350 MG/ML SOLN COMPARISON:  CT abdomen pelvis 10/20/2017, CTA chest 05/12/2015 FINDINGS: CTA CHEST FINDINGS Cardiovascular: Satisfactory opacification  the pulmonary arteries to the segmental level. Stable web-like filling defects are present in branching of the left lower lobar artery. No acute hypoattenuating filling defect is seen. Central pulmonary arteries are normal caliber. Normal caliber of the thoracic aorta. No acute luminal abnormality, periaortic stranding or hemorrhage. Normal 3 vessel branching of the aortic arch. Normal heart size. No pericardial effusion. Mediastinum/Nodes: No enlarged mediastinal, hilar, or axillary lymph nodes. Thyroid gland and thoracic inlet are unremarkable. No acute abnormality of the trachea. Small amount of ingested material within the esophagus. Lungs/Pleura: Numerous pulmonary cysts are similar to prior. Mosaic attenuation within the lungs likely related to imaging during exhalation though a possible air trapping or small airways disease could have a similar appearance. No consolidative opacity. No acute traumatic abnormality of the lung parenchyma. Basilar atelectatic changes are noted. No pneumothorax or effusion. Musculoskeletal: No chest wall abnormality. No acute or significant osseous findings. Prominent vascular channel in the left anterior articular facet of T9 is stable from prior. Review of the MIP images confirms the above findings. CT ABDOMEN and PELVIS FINDINGS Hepatobiliary: No focal hepatic injury or perihepatic hematoma. Diffuse hepatic hypoattenuation compatible with hepatic steatosis. No focal liver abnormality is seen. Patient is post cholecystectomy. Slight prominence of the biliary tree likely related to reservoir effect. No  calcified intraductal gallstones. Pancreas: Unremarkable. No pancreatic ductal dilatation or surrounding inflammatory changes. Spleen: Normal arterial enhancement of the spleen. No splenic injury or perisplenic hematoma. Adrenals/Urinary Tract: No adrenal hematoma or suspicious adrenal lesions. Kidneys enhance and excrete symmetrically. No direct renal injury or perirenal hematoma. No suspicious renal lesions, urolithiasis or hydronephrosis. No acute bladder injury or other abnormality. Stomach/Bowel: Distal esophagus, stomach and duodenal sweep are unremarkable. No small bowel wall thickening or dilatation. No evidence of obstruction. A normal appendix is visualized. No colonic dilatation or wall thickening. Vascular/Lymphatic: The aorta is normal caliber. No suspicious or enlarged lymph nodes in the included lymphatic chains. Reproductive: Uterus is surgically absent. Retained ovaries. 2.9 cm cyst adjacent the left ovary. No other concerning adnexal lesion. Other: No free fluid or free air. No traumatic abdominal injury. No bowel containing hernias. Musculoskeletal: Multilevel degenerative changes are present in the imaged portions of the spine. Additional degenerative changes in the hips. No acute osseous abnormality or suspicious osseous lesion. Few stable bone islands in the left pelvis. Review of the MIP images confirms the above findings. IMPRESSION: 1. No acute traumatic injury of the chest, abdomen or pelvis. 2. Stable web-like filling defects in the branching of the left lower lobar artery, favored to represent chronic pulmonary emboli. 3. No acute pulmonary arterial filling defects are identified. No evidence of right heart strain. 4. Mosaic attenuation of the lungs may reflect imaging during exhalation or underlying air trapping/small airways disease. 5. 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 Consider nonemergent high-resolution  chest CT for further evaluation. 6. Post hysterectomy with retained ovaries. 2.9 cm left adnexal cyst in the left ovary is within physiologic normal for a premenopausal female. 7. Hepatic steatosis. 8. Aortic Atherosclerosis (ICD10-I70.0) Electronically Signed   By: Lovena Le M.D.   On: 11/08/2019 23:42   Dg Pelvis Portable  Result Date: 11/08/2019 CLINICAL DATA:  Fall EXAM: PORTABLE PELVIS 1-2 VIEWS COMPARISON:  Radiograph 01/02/2008 FINDINGS: Appearance worrisome for a minimally impacted subcapital left femoral neck fracture. Femoral heads remain normally located. No other acute pelvic fracture or traumatic diastasis. Degenerative changes noted in the SI joints and both hips. Included portions  of the lumbar spine are unremarkable aside from mild discogenic change. Bowel gas pattern is normal. Soft tissues are unremarkable. IMPRESSION: Findings worrisome for a minimally impacted subcapital left femoral neck fracture. Electronically Signed   By: Lovena Le M.D.   On: 11/08/2019 21:41   Ct L-spine No Charge  Result Date: 11/08/2019 CLINICAL DATA:  Fall EXAM: CT LUMBAR SPINE WITHOUT CONTRAST TECHNIQUE: Multiplanar CT reconstructions of the lumbar spine were performed from concurrent CT of the chest, abdomen and pelvis. COMPARISON:  CT abdomen pelvis 10/20/2017, CT L-spine 02/03/2013 FINDINGS: Segmentation: 5 lumbar type vertebrae. Alignment: Preservation of the normal lumbar lordosis. Mild grade 1 anterolisthesis L5 on S1 is stable from comparison in 2014. Posterior elements are normally aligned without abnormal facet widening. Vertebrae: No acute vertebral body fracture, height loss other focal pathologic process. Posterior elements are intact. Included portions of the sacrum and pelvis are unremarkable aside from vacuum phenomenon in both SI joints and mild SI joint widening, similar to comparison. Paraspinal and other soft tissues: No paravertebral fluid or swelling. Mild posterior body wall edema.  For findings in the abdomen and pelvis, please see dedicated CT from which this study is generated. Disc levels: Minimal posterior disc bulging at L4-5 and L5-S1 without significant canal stenosis. Posterior facet hypertrophic changes are most pronounced at the same levels with resulting mild bilateral foraminal narrowing at L4-5 and moderate bilateral neural foraminal narrowing at L5-S1. IMPRESSION: 1. No acute fracture or subluxation of the lumbar spine. 2. Mild grade 1 anterolisthesis L5 on S1 is stable from comparison in 2014. 3. Posterior facet hypertrophic changes at L4-5 and L5-S1 resulting in mild bilateral foraminal narrowing at L4-5 and moderate bilateral neural foraminal narrowing at L5-S1. 4. For findings in the abdomen and pelvis, please see dedicated CT from which this study is generated. Electronically Signed   By: Lovena Le M.D.   On: 11/08/2019 23:51   Dg Chest Portable 1 View  Result Date: 11/08/2019 CLINICAL DATA:  Fall, chest pain EXAM: PORTABLE CHEST 1 VIEW COMPARISON:  02/27/2018 FINDINGS: Heart and mediastinal contours are within normal limits. No focal opacities or effusions. No acute bony abnormality. IMPRESSION: No active disease. Electronically Signed   By: Rolm Baptise M.D.   On: 11/08/2019 21:40     Antimicrobials:   None   Subjective: Reports improving strength but still significant weakness  Objective: Vitals:   11/11/19 0248 11/11/19 0409 11/11/19 0838 11/11/19 0851  BP: 102/72 100/73  118/78  Pulse: 79 78  78  Resp: 14 15  18   Temp: 98.6 F (37 C) 98.4 F (36.9 C)  98.3 F (36.8 C)  TempSrc: Oral Oral  Oral  SpO2: 94% 95% 96% 96%  Weight:  75.5 kg    Height:        Intake/Output Summary (Last 24 hours) at 11/11/2019 1417 Last data filed at 11/11/2019 1300 Gross per 24 hour  Intake 660 ml  Output 3500 ml  Net -2840 ml   Filed Weights   11/08/19 2320 11/10/19 0442 11/11/19 0409  Weight: 73.9 kg 75 kg 75.5 kg    Examination:  General exam:  Appears calm and comfortable  Respiratory system: Clear to auscultation. Respiratory effort normal. Cardiovascular system: S1 & S2 heard, RRR. No JVD, murmurs, rubs, gallops or clicks. No pedal edema. Gastrointestinal system: Abdomen is nondistended, soft and nontender. No organomegaly or masses felt. Normal bowel sounds heard. Central nervous system: Alert and oriented.  Right lower extremity weakness as below3-4/5 Extremities: Asymmetrical weakness  lower extremity. Skin: No rashes, lesions or ulcers Psychiatry: Judgement and insight appear normal. Mood & affect .     Data Reviewed: I have personally reviewed following labs and imaging studies  CBC: Recent Labs  Lab 11/08/19 2116 11/09/19 0603 11/09/19 2326 11/10/19 0454 11/11/19 0358  WBC 10.0 10.7* 10.7* 10.4 10.1  NEUTROABS 4.4  --  5.0 5.0 3.9  HGB 14.0 12.8 11.9* 11.9* 11.5*  HCT 42.5 40.5 36.7 37.2 36.4  MCV 79.3* 80.5 79.8* 80.9 81.4  PLT 314 312 290 291 [id number]   Basic Metabolic Panel: Recent Labs  Lab 11/08/19 2116 11/09/19 0603 11/10/19 0454 11/11/19 0358  NA 133* 135 136 138  K 4.0 3.9 3.5 3.7  CL 95* 97* 98 103  CO2 26 28 29 25   GLUCOSE 390* 290* 202* 185*  BUN 9 8 13 11   CREATININE 0.82 0.72 1.01* 0.82  CALCIUM 9.7 9.3 8.5* 8.3*   GFR: Estimated Creatinine Clearance: 76.2 mL/min (by C-G formula based on SCr of 0.82 mg/dL). Liver Function Tests: Recent Labs  Lab 11/08/19 2116  AST 36  ALT 44  ALKPHOS 64  BILITOT 0.5  PROT 8.2*  ALBUMIN 3.8   No results for input(s): LIPASE, AMYLASE in the last 168 hours. No results for input(s): AMMONIA in the last 168 hours. Coagulation Profile: No results for input(s): INR, PROTIME in the last 168 hours. Cardiac Enzymes: No results for input(s): CKTOTAL, CKMB, CKMBINDEX, TROPONINI in the last 168 hours. BNP (last 3 results) No results for input(s): PROBNP in the last 8760 hours. HbA1C: Recent Labs    11/09/19 0603  HGBA1C 9.9*   CBG: Recent Labs  Lab  11/10/19 1148 11/10/19 1620 11/10/19 2056 11/11/19 0719 11/11/19 1119  GLUCAP 212* 182* 242* 178* 197*   Lipid Profile: Recent Labs    11/10/19 0454  CHOL 163  HDL 38*  LDLCALC 103*  TRIG 108  CHOLHDL 4.3   Thyroid Function Tests: No results for input(s): TSH, T4TOTAL, FREET4, T3FREE, THYROIDAB in the last 72 hours. Anemia Panel: No results for input(s): VITAMINB12, FOLATE, FERRITIN, TIBC, IRON, RETICCTPCT in the last 72 hours. Sepsis Labs: No results for input(s): PROCALCITON, LATICACIDVEN in the last 168 hours.  Recent Results (from the past 240 hour(s))  SARS CORONAVIRUS 2 (TAT 6-24 HRS) Nasopharyngeal Nasopharyngeal Swab     Status: None   Collection Time: 11/09/19 12:06 AM   Specimen: Nasopharyngeal Swab  Result Value Ref Range Status   SARS Coronavirus 2 NEGATIVE NEGATIVE Final    Comment: (NOTE) SARS-CoV-2 target nucleic acids are NOT DETECTED. The SARS-CoV-2 RNA is generally detectable in upper and lower respiratory specimens during the acute phase of infection. Negative results do not preclude SARS-CoV-2 infection, do not rule out co-infections with other pathogens, and should not be used as the sole basis for treatment or other patient management decisions. Negative results must be combined with clinical observations, patient history, and epidemiological information. The expected result is Negative. Fact Sheet for Patients: SugarRoll.be Fact Sheet for Healthcare Providers: https://www.woods-mathews.com/ This test is not yet approved or cleared by the [place] FDA and  has been authorized for detection and/or diagnosis of SARS-CoV-2 by FDA under an Emergency Use Authorization (EUA). This EUA will remain  in effect (meaning this test can be used) for the duration of the COVID-19 declaration under Section 56 4(b)(1) of the Act, 21 U.S.C. section 360bbb-3(b)(1), unless the authorization is terminated or revoked  sooner. Performed at [facility], [place] [address],  [place], [place] [address]          Radiology Studies: Mr Brain Wo Contrast  Result Date: 11/10/2019 CLINICAL DATA:  Syncope. Personal history of seizures. Transient loss of consciousness. Right leg numbness and weakness. EXAM: MRI HEAD WITHOUT CONTRAST TECHNIQUE: Multiplanar, multiecho pulse sequences of the brain and surrounding structures were obtained without intravenous contrast. COMPARISON:  CT head without contrast 11/08/2019 FINDINGS: Brain: The diffusion-weighted images demonstrate no acute or subacute infarction. Partially empty sella is again noted without significant interval change. Midline structures are otherwise unremarkable. No significant white matter lesions are present. The ventricles are of normal size. No significant extraaxial fluid collection is present. The internal auditory canals are within normal limits. The brainstem and cerebellum are within normal limits. Vascular: Flow is present in the major intracranial arteries. Skull and upper cervical spine: The craniocervical junction is normal. Upper cervical spine is within normal limits. Marrow signal is unremarkable. Sinuses/Orbits: The paranasal sinuses and mastoid air cells are clear. The globes and orbits are within normal limits. IMPRESSION: Normal MRI appearance the brain. No acute or focal lesion to explain the patient's symptoms. Electronically Signed   By: San Morelle M.D.   On: 11/10/2019 05:10        Scheduled Meds:  amLODipine  10 mg Oral QHS   atorvastatin  10 mg Oral q1800   Chlorhexidine Gluconate Cloth  6 each Topical Daily   escitalopram  10 mg Oral QHS   insulin aspart  0-15 Units Subcutaneous TID WC   insulin aspart  0-5 Units Subcutaneous QHS   levETIRAcetam  500 mg Oral BID   lisinopril  2.5 mg Oral Daily   mometasone-formoterol  2 puff Inhalation BID   montelukast  10 mg Oral QHS   pregabalin  100 mg Oral TID    rivaroxaban  20 mg Oral Q supper   sodium chloride flush  3 mL Intravenous Q12H   sodium chloride flush  3 mL Intravenous Q12H   topiramate  25 mg Oral QHS   Continuous Infusions:  sodium chloride       LOS: 1 day    Time spent: 75 MIN      Nicolette Bang, MD [facility]  If 7PM-7AM, please contact night-coverage  11/11/2019, 2:17 PM

## 2019-11-11 NOTE — Progress Notes (Signed)
Neurology Progress Note   S:// Patient seen and examined.  Reports no overnight complaints.  Reports some improvement in her right leg strength.  Did not have any bowel accidents overnight.  Remains with Foley in place.  O:// Current vital signs: BP 118/78 (BP Location: Left Arm)   Pulse 78   Temp 98.3 F (36.8 C) (Oral)   Resp 18   Ht 5' (1.524 m)   Wt 75.5 kg   LMP 11/06/2012   SpO2 96%   BMI 32.51 kg/m  Vital signs in last 24 hours: Temp:  [98.3 F (36.8 C)-99.1 F (37.3 C)] 98.3 F (36.8 C) (12/01 0851) Pulse Rate:  [75-85] 78 (12/01 0851) Resp:  [14-18] 18 (12/01 0851) BP: (100-118)/(69-80) 118/78 (12/01 0851) SpO2:  [94 %-96 %] 96 % (12/01 0851) Weight:  [75.5 kg] 75.5 kg (12/01 0409) Neurological exam She is awake alert oriented x3 Her speech is not dysarthric No evidence of aphasia Cranial: Pupils equal round react light, extraocular movements intact, visual fields full, face appears symmetric, tongue and palate midline. Motor exam: Bilateral upper extremities 5/5 with no drift.  Right lower extremity reveals at least 4/5 strength at the hip and knee with coaching.  Initially, she did not give much effort but on further coaching she was able to at least give 4/5 strength all through her right leg.  Left leg is 5/5 without drift. Sensory exam: Intact light touch all over Coordination: No gross dysmetria   Medications  Current Facility-Administered Medications:  .  0.9 %  sodium chloride infusion, 250 mL, Intravenous, PRN, Opyd, Ilene Qua, MD .  acetaminophen (TYLENOL) tablet 650 mg, 650 mg, Oral, Q6H PRN, 650 mg at 11/10/19 2133 **OR** acetaminophen (TYLENOL) suppository 650 mg, 650 mg, Rectal, Q6H PRN, Opyd, Timothy S, MD .  albuterol (PROVENTIL) (2.5 MG/3ML) 0.083% nebulizer solution 3 mL, 3 mL, Inhalation, Q6H PRN, Opyd, Timothy S, MD .  amLODipine (NORVASC) tablet 10 mg, 10 mg, Oral, QHS, Opyd, Ilene Qua, MD, 10 mg at 11/09/19 0214 .  atorvastatin (LIPITOR)  tablet 10 mg, 10 mg, Oral, q1800, Opyd, Ilene Qua, MD, 10 mg at 11/10/19 1725 .  Chlorhexidine Gluconate Cloth 2 % PADS 6 each, 6 each, Topical, Daily, Alma Friendly, MD, 6 each at 11/10/19 1133 .  escitalopram (LEXAPRO) tablet 10 mg, 10 mg, Oral, QHS, Opyd, Ilene Qua, MD, 10 mg at 11/10/19 2134 .  insulin aspart (novoLOG) injection 0-15 Units, 0-15 Units, Subcutaneous, TID WC, Alma Friendly, MD, 3 Units at 11/11/19 0746 .  insulin aspart (novoLOG) injection 0-5 Units, 0-5 Units, Subcutaneous, QHS, Opyd, Ilene Qua, MD, 3 Units at 11/10/19 2144 .  levETIRAcetam (KEPPRA) tablet 500 mg, 500 mg, Oral, BID, Alma Friendly, MD, 500 mg at 11/10/19 2133 .  lisinopril (ZESTRIL) tablet 2.5 mg, 2.5 mg, Oral, Daily, Opyd, Ilene Qua, MD, 2.5 mg at 11/10/19 1028 .  mometasone-formoterol (DULERA) 200-5 MCG/ACT inhaler 2 puff, 2 puff, Inhalation, BID, Opyd, Ilene Qua, MD, 2 puff at 11/11/19 [phone] .  montelukast (SINGULAIR) tablet 10 mg, 10 mg, Oral, QHS, Opyd, Ilene Qua, MD, 10 mg at 11/10/19 2133 .  morphine 2 MG/ML injection 2-4 mg, 2-4 mg, Intravenous, Q4H PRN, Opyd, Ilene Qua, MD, 2 mg at 11/09/19 0941 .  ondansetron (ZOFRAN) tablet 4 mg, 4 mg, Oral, Q6H PRN **OR** ondansetron (ZOFRAN) injection 4 mg, 4 mg, Intravenous, Q6H PRN, Opyd, Ilene Qua, MD, 4 mg at 11/10/19 0418 .  polyethylene glycol (MIRALAX / GLYCOLAX) packet 17 g,  17 g, Oral, Daily PRN, Opyd, Ilene Qua, MD .  pregabalin (LYRICA) capsule 100 mg, 100 mg, Oral, TID, Opyd, Ilene Qua, MD, 100 mg at 11/10/19 2134 .  rivaroxaban (XARELTO) tablet 20 mg, 20 mg, Oral, Q supper, Alma Friendly, MD, 20 mg at 11/10/19 1725 .  sodium chloride flush (NS) 0.9 % injection 3 mL, 3 mL, Intravenous, Q12H, Opyd, Ilene Qua, MD, 3 mL at 11/10/19 2137 .  sodium chloride flush (NS) 0.9 % injection 3 mL, 3 mL, Intravenous, Q12H, Opyd, Ilene Qua, MD, 3 mL at 11/10/19 2137 .  sodium chloride flush (NS) 0.9 % injection 3 mL, 3 mL, Intravenous, PRN,  Opyd, Ilene Qua, MD .  topiramate (TOPAMAX) tablet 25 mg, 25 mg, Oral, QHS, Opyd, Ilene Qua, MD, 25 mg at 11/10/19 2144 Labs CBC    Component Value Date/Time   WBC 10.1 11/11/2019 0358   RBC 4.47 11/11/2019 0358   HGB 11.5 (L) 11/11/2019 0358   HGB 12.2 12/26/2012 1318   HCT 36.4 11/11/2019 0358   HCT 37.4 12/26/2012 1318   PLT 294 11/11/2019 0358   PLT 347 12/26/2012 1318   MCV 81.4 11/11/2019 0358   MCV 78.6 (L) 12/26/2012 1318   MCH 25.7 (L) 11/11/2019 0358   MCHC 31.6 11/11/2019 0358   RDW 14.3 11/11/2019 0358   RDW 15.1 (H) 12/26/2012 1318   LYMPHSABS 5.3 (H) 11/11/2019 0358   LYMPHSABS 3.0 12/26/2012 1318   MONOABS 0.6 11/11/2019 0358   MONOABS 0.7 12/26/2012 1318   EOSABS 0.2 11/11/2019 0358   EOSABS 0.2 12/26/2012 1318   BASOSABS 0.1 11/11/2019 0358   BASOSABS 0.1 12/26/2012 1318    CMP     Component Value Date/Time   NA 138 11/11/2019 0358   NA 139 12/25/2018 1537   NA 136 12/26/2012 1318   K 3.7 11/11/2019 0358   K 4.0 12/26/2012 1318   CL 103 11/11/2019 0358   CL 100 12/26/2012 1318   CO2 25 11/11/2019 0358   CO2 27 12/26/2012 1318   GLUCOSE 185 (H) 11/11/2019 0358   GLUCOSE 271 (H) 12/26/2012 1318   BUN 11 11/11/2019 0358   BUN 8 12/25/2018 1537   BUN 7.0 12/26/2012 1318   CREATININE 0.82 11/11/2019 0358   CREATININE 0.77 05/09/2016 1305   CREATININE 0.9 12/26/2012 1318   CALCIUM 8.3 (L) 11/11/2019 0358   CALCIUM 9.3 12/26/2012 1318   PROT 8.2 (H) 11/08/2019 2116   PROT 7.6 12/26/2012 1318   ALBUMIN 3.8 11/08/2019 2116   ALBUMIN 3.1 (L) 12/26/2012 1318   AST 36 11/08/2019 2116   AST 18 12/26/2012 1318   ALT 44 11/08/2019 2116   ALT 33 12/26/2012 1318   ALKPHOS 64 11/08/2019 2116   ALKPHOS 60 12/26/2012 1318   BILITOT 0.5 11/08/2019 2116   BILITOT 0.31 12/26/2012 1318   GFRNONAA >60 11/11/2019 0358   GFRAA >60 11/11/2019 0358     Imaging I have reviewed images in epic and the results pertinent to this consultation are: As in  yesterday's note-MRI brain with no acute changes.  MRI of L-spine and thoracic spine with small central disc protrusion at upper thoracic lower cervical C7/T1 indenting the ventral spinal cord.  No associated spinal canal stenosis.  Moderate lower lumbar facet arthrosis which might be the source of low back pain but cannot explain urinary or other symptoms.  EEG within normal limits.  Assessment: 48 year old woman past history of seizures, presented with syncopal episode and no witnessed seizures, upon admission  complained of decreased right lower extremity strength and inability to walk.  Also had urinary retention for which she required a Foley catheter placement. Thus far, the MRI brain, thoracic and lumbar spine have been unremarkable for any etiology. Acute urinary retention can sometimes be associated with Zofran, which she had received for some nausea and vomiting. As far as right right leg weakness is concerned, she had a very inconsistent exam yesterday with a positive Hoover sign and today also exhibited some weakness but with coaching was able to give me good strength-at least 4+/5. I suspect that this weakness is nonorganic.  Recommendations: -Continue physical and occupational therapy. -Consider urology consultation if she continues to have urinary retention. -She has outpatient follow-up with [facility]-she should keep that appointment. -Continue Keppra 500 twice daily. -Also continue Topamax and Lyrica which she is taking for migraines and back pain-both of them have antiepileptic properties as they are essentially antiepileptic drugs. -Continue to maintain seizure precautions. -Given the history of current admission presentation with transient loss of consciousness, although there was no witnessed seizure, given the history of seizures, she should not drive for next 6 months according to the [facility].  Detailed seizure precautions are documented below.   Neurology will be available as needed.  SEIZURE PRECAUTIONS/INSTRUCTIONS Per [facility] statutes, patients with seizures are not allowed to drive until they have been seizure-free for six months.   Use caution when using heavy equipment or power tools. Avoid working on ladders or at heights. Take showers instead of baths. Ensure the water temperature is not too high on the home water heater. Do not go swimming alone. Do not lock yourself in a room alone (i.e. bathroom). When caring for infants or small children, sit down when holding, feeding, or changing them to minimize risk of injury to the child in the event you have a seizure. Maintain good sleep hygiene. Avoid alcohol.   If patient has another seizure, call 911 and bring them back to the ED if: A. The seizure lasts longer than 5 minutes.  B. The patient doesn't wake shortly after the seizure or has new problems such as difficulty seeing, speaking or moving following the seizure C. The patient was injured during the seizure D. The patient has a temperature over 102 F (39C) E. The patient vomited during the seizure and now is having trouble breathing  -- Amie Portland, MD [facility] Pager: [phone] If 7pm to 7am, please call on call as listed on AMION.

## 2019-11-12 LAB — CBC WITH DIFFERENTIAL/PLATELET
Abs Immature Granulocytes: 0.02 10*3/uL (ref 0.00–0.07)
Basophils Absolute: 0.1 10*3/uL (ref 0.0–0.1)
Basophils Relative: 1 %
Eosinophils Absolute: 0.2 10*3/uL (ref 0.0–0.5)
Eosinophils Relative: 2 %
HCT: 37.4 % (ref 36.0–46.0)
Hemoglobin: 12 g/dL (ref 12.0–15.0)
Immature Granulocytes: 0 %
Lymphocytes Relative: 44 %
Lymphs Abs: 5.1 10*3/uL — ABNORMAL HIGH (ref 0.7–4.0)
MCH: 26 pg (ref 26.0–34.0)
MCHC: 32.1 g/dL (ref 30.0–36.0)
MCV: 81.1 fL (ref 80.0–100.0)
Monocytes Absolute: 0.7 10*3/uL (ref 0.1–1.0)
Monocytes Relative: 6 %
Neutro Abs: 5.5 10*3/uL (ref 1.7–7.7)
Neutrophils Relative %: 47 %
Platelets: 303 10*3/uL (ref 150–400)
RBC: 4.61 MIL/uL (ref 3.87–5.11)
RDW: 14.2 % (ref 11.5–15.5)
WBC: 11.6 10*3/uL — ABNORMAL HIGH (ref 4.0–10.5)
nRBC: 0 % (ref 0.0–0.2)

## 2019-11-12 LAB — BASIC METABOLIC PANEL
Anion gap: 10 (ref 5–15)
BUN: 9 mg/dL (ref 6–20)
CO2: 28 mmol/L (ref 22–32)
Calcium: 9 mg/dL (ref 8.9–10.3)
Chloride: 99 mmol/L (ref 98–111)
Creatinine, Ser: 0.75 mg/dL (ref 0.44–1.00)
GFR calc Af Amer: >60 mL/min (ref >60)
GFR calc non Af Amer: >60 mL/min (ref >60)
Glucose, Bld: 211 mg/dL — ABNORMAL HIGH (ref 70–99)
Potassium: 3.5 mmol/L (ref 3.5–5.1)
Sodium: 137 mmol/L (ref 135–145)

## 2019-11-12 LAB — GLUCOSE, CAPILLARY
Glucose-Capillary: 198 mg/dL — ABNORMAL HIGH (ref 70–99)
Glucose-Capillary: 201 mg/dL — ABNORMAL HIGH (ref 70–99)
Glucose-Capillary: 210 mg/dL — ABNORMAL HIGH (ref 70–99)
Glucose-Capillary: 234 mg/dL — ABNORMAL HIGH (ref 70–99)

## 2019-11-12 MED ORDER — INSULIN DETEMIR 100 UNIT/ML ~~LOC~~ SOLN
10.0000 [IU] | Freq: Every day | SUBCUTANEOUS | Status: DC
  Administered 2019-11-12: 10 [IU] via SUBCUTANEOUS
  Filled 2019-11-12 (×2): qty 0.1

## 2019-11-12 NOTE — Progress Notes (Signed)
Inpatient Rehabilitation Admissions Coordinator  Noted diagnosis with transient loss of consciousness with history of seizures. It is unlikely that [facility] will approve CIR admit due to lack of medical neccesity for hospital based rehab. Recommend Home with [facility] with family support or SNF at this time.  Danne Baxter, RN, MSN Rehab Admissions Coordinator [phone] 11/12/2019 9:32 AM

## 2019-11-12 NOTE — Progress Notes (Signed)
Physical Therapy Treatment Patient Details Name: Makayla Frazier MRN: [id number] DOB: 1971/12/07 Today's Date: 11/12/2019    History of Present Illness 48 yo female with onset of syncopal episode was admitted, now has been given EEG with no seizure activity found.  MRI found C7-T1 disc protrusion with ventral cord pressure, has chronic PE's, unconfirmed L femoral neck fracture.  Referred to PT for weakness on R LE.  PMHx:  anterolisthesis L5-S1, hepatic steatosis, atherosclerosis, lumbar foraminal narrowing L4-S1     PT Comments    Pt lethargic & with flat affect throughout session. Therapist educates pt on log rolling to maintain back precautions with pt requiring significant assistance for bed mobility. Pt is able to transfer sit>stand at EOB with mod assist & RW. Once standing therapist attempts to assist pt with side stepping at EOB but pt with decreased verbalizations to therapist's questions and does not initiate side stepping to pt instructed to return to sitting EOB. Pt scooted to [facility] with cuing & extra time. Pt reports only fatigue & no other adverse symptoms while standing EOB. Vitals during session noted below. Per chart, pt is not a CIR candidate so d/c recommendations have been changed with current recommendation of SNF as pt will require 24 hr assist and physical assistance with all mobility. Will continue to follow acutely to focus on bed mobility, transfers, & gait as able, as well as to assist with d/c planning.  Supine: BP= 113/79 mmHg, HR = 76 bpm Sitting EOB: BP = 117/73 mmHg, HR = 75 bpm Returned to supine in bed: BP = 112/70 mmHg, HR = 73 bpm   Follow Up Recommendations  Supervision/Assistance - 24 hour;SNF     Equipment Recommendations  (TBD in next venue)    Recommendations for Other Services       Precautions / Restrictions Precautions Precautions: Fall;Back Restrictions Weight Bearing Restrictions: No    Mobility  Bed Mobility Overal bed mobility: Needs  Assistance Bed Mobility: Rolling;Sit to Sidelying;Sidelying to Sit Rolling: Min assist Sidelying to sit: Mod assist;HOB elevated     Sit to sidelying: Max assist General bed mobility comments: max cuing for log rolling technique and sequencing  Transfers Overall transfer level: Needs assistance Equipment used: Rolling walker (2 wheeled);1 person hand held assist Transfers: Sit to/from Stand Sit to Stand: Mod assist         General transfer comment: blocking at R Knee but no buckling noted  Ambulation/Gait                 Stairs             Wheelchair Mobility    Modified Rankin (Stroke Patients Only)       Balance Overall balance assessment: Needs assistance Sitting-balance support: Feet supported Sitting balance-Leahy Scale: Fair     Standing balance support: Bilateral upper extremity supported;During functional activity Standing balance-Leahy Scale: Poor Standing balance comment: BUE support on RW & min assist                            Cognition Arousal/Alertness: Lethargic Behavior During Therapy: Flat affect Overall Cognitive Status: Within Functional Limits for tasks assessed                                 General Comments: pt with decreased verbalizations with mobility & afterwards reports she's just tired  Exercises      General Comments        Pertinent Vitals/Pain Pain Assessment: 0-10 Pain Score: 6  Pain Location: R side & RLE with movement Pain Descriptors / Indicators: (does not describe) Pain Intervention(s): Limited activity within patient's tolerance;Monitored during session    Home Living                      Prior Function            PT Goals (current goals can now be found in the care plan section) Acute Rehab PT Goals Patient Stated Goal: get home, feel better PT Goal Formulation: With patient Time For Goal Achievement: 11/24/19 Potential to Achieve Goals:  Good Progress towards PT goals: Progressing toward goals    Frequency    Min 3X/week      PT Plan Discharge plan needs to be updated    Co-evaluation              AM-PAC PT "6 Clicks" Mobility   Outcome Measure  Help needed turning from your back to your side while in a flat bed without using bedrails?: A Lot Help needed moving from lying on your back to sitting on the side of a flat bed without using bedrails?: A Lot Help needed moving to and from a bed to a chair (including a wheelchair)?: A Lot Help needed standing up from a chair using your arms (e.g., wheelchair or bedside chair)?: A Lot Help needed to walk in hospital room?: A Lot Help needed climbing 3-5 steps with a railing? : Total 6 Click Score: 11    End of Session   Activity Tolerance: Patient limited by lethargy;Patient limited by fatigue Patient left: in bed;with call bell/phone within reach;with bed alarm set Nurse Communication: Mobility status PT Visit Diagnosis: Unsteadiness on feet (R26.81);Muscle weakness (generalized) (M62.81);History of falling (Z91.81);Pain Pain - Right/Left: Right Pain - part of body: Leg;Knee;Ankle and joints of foot     Time: 1451-1506 PT Time Calculation (min) (ACUTE ONLY): 15 min  Charges:  $Therapeutic Activity: 8-22 mins                         Waunita Schooner, PT, DPT 11/12/2019, 3:20 PM

## 2019-11-12 NOTE — Progress Notes (Signed)
PROGRESS NOTE    Makayla Frazier  [id number] DOB: 02-01-1971 DOA: 11/08/2019 PCP: Harvie Heck, MD   Brief Narrative:  HPI from Dr Derenda Mis a 48 y.o.femalewith medical history significant forseizure disorder, PE on Xarelto, hypertension, type 2 diabetes mellitus, and asthma, now presenting to emergency department after a transient loss of consciousness and reports pain in the right side of her back as well as new right leg numbness and weakness. Patient reports that she been in her usual state of health and was having an uneventful day when she was lifting up a case of water bottles and then lost consciousness. Family was nearby,did not see her fall, but saw her immediately afterwards with no seizure-like activity. In the ED, patient noted to be tachycardic with hypertensive crisis, SBP in the 200s. Head CT and cervical spine CT are negative for acute findings. CT chest/abdomen/pelvis in the ED which is notable for suspected chronic PE involving the left lower lobar artery without acute PE or right heart strain. Labs notable for glucose of 390, UDS negative. Patient admitted for further management.   Assessment & Plan:   Principal Problem:   Transient loss of consciousness Active Problems:   Essential hypertension   Chronic pulmonary embolism (HCC)   Seizure disorder (HCC)   Uncontrolled diabetes mellitus with hyperglycemia (HCC)   Weakness of right leg   Multiple idiopathic pulmonary cysts   Hypertensive urgency   Transient loss of consciousness/syncope ?? due to seizures, none since patient was in the hospital Troponin 5-->12, EKG with no acute ST changes Head CT and cervical spine CT are negative for acute findings. CT chest/abdomen/pelvis in the ED which is notable for suspected chronic PE involving the left lower lobar artery without acute PE or right heart strain Echo with EF of 60 to [id number], grade 1 diastolic dysfunction EEGstudy within normal  limits MRI brainunremarkable Neurology consulted, appreciate recs question organic etiology Telemetry, frequent neuro checks  Right leg weakness and numbness/??Todds paralysis Presents after transient LOC with fall and reports right-sided back pain with new right leg numbness and weakness MRI brainunremarkable MRI T- and L-spine showed moderate lower lumbar facet arthrosis, small central disc protrusion at C7-T1 indenting the ventral spinal cord, no associated spinal canal stenosis Neurology consulted, appreciate recs PT/OT with recommendations for rehab  Acute urinary retention Unknown etiology, previously never had any episode Continue Foley,plan for voiding trial  Hypertension Hypotensive episode noted monitor closely, ? complaince Presented with hypertensive crisis Continue Norvasc, lisinopril,hold chlorthalidone for now,will adjust per BP readings  Seizure disorder Last seizure was ~6 months ago, and reports adherence with her antiepileptics(noted to be taking Keppra once daily instead of twice daily as it made her sick) EEGwithin normal limits ContinueKeppra twice daily Appreciate neuro input  Type II DM A1c 9.9, uncontrolled SSI, Accu-Cheks, hypoglycemic protocol Hold home Trulicity  Chronic PE Continue Xarelto  Asthma Continue ICS/LABA, Singulair, and as-needed albuterol  Obesity Lifestyle modification advised  DVT prophylaxis: [id number]   Code Status: Full code    Code Status Orders  (From admission, onward)         Start     Ordered   11/09/19 0933  Full code  Continuous     11/09/19 0932        Code Status History    Date Active Date Inactive Code Status Order ID Comments User Context   05/12/2015 2111 05/13/2015 1859 Full Code [id number]  Toy Baker, MD Inpatient   02/13/2014 1407 02/14/2014 1754  Full Code [id number]  Earnstine Regal, PA-C Inpatient   07/17/2013 1610 07/17/2013 2236 Full Code [id number]  Thompson Grayer, MD  Inpatient   12/18/2012 1705 12/19/2012 2215 Full Code [id number]  Bonney Aid, RN Inpatient   08/03/2012 2019 08/05/2012 2348 Full Code [id number]  Tsoutis, Mosetta Pigeon, RN Inpatient   Advance Care Planning Activity     Family Communication: With husband at bedside Disposition Plan:   Patient remained inpatient for continued physical therapy for gait mobility balance and transfer.  Patient unstable and unsafe for discharge Consults called: Neurology Admission status: Inpatient   Consultants:   As above  Procedures:  Ct Head Wo Contrast  Result Date: 11/08/2019 CLINICAL DATA:  Syncope, fall. EXAM: CT HEAD WITHOUT CONTRAST TECHNIQUE: Contiguous axial images were obtained from the base of the skull through the vertex without intravenous contrast. COMPARISON:  05/25/2016 FINDINGS: Brain: No acute intracranial abnormality. Specifically, no hemorrhage, hydrocephalus, mass lesion, acute infarction, or significant intracranial injury. Vascular: No hyperdense vessel or unexpected calcification. Skull: No acute calvarial abnormality. Sinuses/Orbits: Visualized paranasal sinuses and mastoids clear. Orbital soft tissues unremarkable. Other: None IMPRESSION: Normal study. Electronically Signed   By: Rolm Baptise M.D.   On: 11/08/2019 23:27   Ct Angio Chest Pe W And/or Wo Contrast  Result Date: 11/08/2019 CLINICAL DATA:  Fall while walking from living room to kitchen, history of seizures, no noted seizure activity, anticoagulated EXAM: CT ANGIOGRAPHY CHEST CT ABDOMEN AND PELVIS WITH CONTRAST TECHNIQUE: Multidetector CT imaging of the chest was performed using the standard protocol during bolus administration of intravenous contrast. Multiplanar CT image reconstructions and MIPs were obtained to evaluate the vascular anatomy. Multidetector CT imaging of the abdomen and pelvis was performed using the standard protocol during bolus administration of intravenous contrast. CONTRAST:  185mL OMNIPAQUE IOHEXOL 350 MG/ML  SOLN COMPARISON:  CT abdomen pelvis 10/20/2017, CTA chest 05/12/2015 FINDINGS: CTA CHEST FINDINGS Cardiovascular: Satisfactory opacification the pulmonary arteries to the segmental level. Stable web-like filling defects are present in branching of the left lower lobar artery. No acute hypoattenuating filling defect is seen. Central pulmonary arteries are normal caliber. Normal caliber of the thoracic aorta. No acute luminal abnormality, periaortic stranding or hemorrhage. Normal 3 vessel branching of the aortic arch. Normal heart size. No pericardial effusion. Mediastinum/Nodes: No enlarged mediastinal, hilar, or axillary lymph nodes. Thyroid gland and thoracic inlet are unremarkable. No acute abnormality of the trachea. Small amount of ingested material within the esophagus. Lungs/Pleura: Numerous pulmonary cysts are similar to prior. Mosaic attenuation within the lungs likely related to imaging during exhalation though a possible air trapping or small airways disease could have a similar appearance. No consolidative opacity. No acute traumatic abnormality of the lung parenchyma. Basilar atelectatic changes are noted. No pneumothorax or effusion. Musculoskeletal: No chest wall abnormality. No acute or significant osseous findings. Prominent vascular channel in the left anterior articular facet of T9 is stable from prior. Review of the MIP images confirms the above findings. CT ABDOMEN and PELVIS FINDINGS Hepatobiliary: No focal hepatic injury or perihepatic hematoma. Diffuse hepatic hypoattenuation compatible with hepatic steatosis. No focal liver abnormality is seen. Patient is post cholecystectomy. Slight prominence of the biliary tree likely related to reservoir effect. No calcified intraductal gallstones. Pancreas: Unremarkable. No pancreatic ductal dilatation or surrounding inflammatory changes. Spleen: Normal arterial enhancement of the spleen. No splenic injury or perisplenic hematoma. Adrenals/Urinary  Tract: No adrenal hematoma or suspicious adrenal lesions. Kidneys enhance and excrete symmetrically. No direct renal injury or perirenal hematoma. No suspicious  renal lesions, urolithiasis or hydronephrosis. No acute bladder injury or other abnormality. Stomach/Bowel: Distal esophagus, stomach and duodenal sweep are unremarkable. No small bowel wall thickening or dilatation. No evidence of obstruction. A normal appendix is visualized. No colonic dilatation or wall thickening. Vascular/Lymphatic: The aorta is normal caliber. No suspicious or enlarged lymph nodes in the included lymphatic chains. Reproductive: Uterus is surgically absent. Retained ovaries. 2.9 cm cyst adjacent the left ovary. No other concerning adnexal lesion. Other: No free fluid or free air. No traumatic abdominal injury. No bowel containing hernias. Musculoskeletal: Multilevel degenerative changes are present in the imaged portions of the spine. Additional degenerative changes in the hips. No acute osseous abnormality or suspicious osseous lesion. Few stable bone islands in the left pelvis. Review of the MIP images confirms the above findings. IMPRESSION: 1. No acute traumatic injury of the chest, abdomen or pelvis. 2. Stable web-like filling defects in the branching of the left lower lobar artery, favored to represent chronic pulmonary emboli. 3. No acute pulmonary arterial filling defects are identified. No evidence of right heart strain. 4. Mosaic attenuation of the lungs may reflect imaging during exhalation or underlying air trapping/small airways disease. 5. 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 Consider nonemergent high-resolution chest CT for further evaluation. 6. Post hysterectomy with retained ovaries. 2.9 cm left adnexal cyst in the left ovary is within physiologic normal for a premenopausal female. 7. Hepatic steatosis. 8. Aortic Atherosclerosis (ICD10-I70.0)  Electronically Signed   By: Lovena Le M.D.   On: 11/08/2019 23:42   Ct Cervical Spine Wo Contrast  Result Date: 11/08/2019 CLINICAL DATA:  Syncope, fall EXAM: CT CERVICAL SPINE WITHOUT CONTRAST TECHNIQUE: Multidetector CT imaging of the cervical spine was performed without intravenous contrast. Multiplanar CT image reconstructions were also generated. COMPARISON:  05/18/2012 FINDINGS: Alignment: No subluxation.  Loss of cervical lordosis. Skull base and vertebrae: No acute fracture. No primary bone lesion or focal pathologic process. Soft tissues and spinal canal: No prevertebral fluid or swelling. No visible canal hematoma. Disc levels: Early spurring. Ossification of the posterior longitudinal ligament. Upper chest: Negative Other: None IMPRESSION: No acute bony abnormality. Electronically Signed   By: Rolm Baptise M.D.   On: 11/08/2019 23:28   Mr Brain Wo Contrast  Result Date: 11/10/2019 CLINICAL DATA:  Syncope. Personal history of seizures. Transient loss of consciousness. Right leg numbness and weakness. EXAM: MRI HEAD WITHOUT CONTRAST TECHNIQUE: Multiplanar, multiecho pulse sequences of the brain and surrounding structures were obtained without intravenous contrast. COMPARISON:  CT head without contrast 11/08/2019 FINDINGS: Brain: The diffusion-weighted images demonstrate no acute or subacute infarction. Partially empty sella is again noted without significant interval change. Midline structures are otherwise unremarkable. No significant white matter lesions are present. The ventricles are of normal size. No significant extraaxial fluid collection is present. The internal auditory canals are within normal limits. The brainstem and cerebellum are within normal limits. Vascular: Flow is present in the major intracranial arteries. Skull and upper cervical spine: The craniocervical junction is normal. Upper cervical spine is within normal limits. Marrow signal is unremarkable. Sinuses/Orbits: The  paranasal sinuses and mastoid air cells are clear. The globes and orbits are within normal limits. IMPRESSION: Normal MRI appearance the brain. No acute or focal lesion to explain the patient's symptoms. Electronically Signed   By: San Morelle M.D.   On: 11/10/2019 05:10   Mr Thoracic Spine Wo Contrast  Result Date: 11/09/2019 CLINICAL DATA:  Back pain with right  lower extremity numbness and weakness. EXAM: MRI THORACIC AND LUMBAR SPINE WITHOUT CONTRAST TECHNIQUE: Multiplanar and multiecho pulse sequences of the thoracic and lumbar spine were obtained without intravenous contrast. COMPARISON:  CT lumbar spine 11/08/2019 FINDINGS: MRI THORACIC SPINE FINDINGS Alignment:  Physiologic. Vertebrae: No fracture, evidence of discitis, or bone lesion. Cord:  Normal signal and morphology. Paraspinal and other soft tissues: Negative. Disc levels: C7-T1: Small central disc protrusion indenting the ventral spinal cord. Otherwise, the thoracic disc levels are normal. MRI LUMBAR SPINE FINDINGS Segmentation:  Stand Alignment:  Normal Vertebrae:  Normal Conus medullaris and cauda equina: Conus extends to the L1 level. Conus and cauda equina appear normal. Paraspinal and other soft tissues: Markedly distended urinary bladder. Cystic focus in the left lower quadrant. Disc levels: L1-2: Normal L2-3: Mild facet hypertrophy. Normal disc. No stenosis. L3-4: Mild facet hypertrophy. Normal disc. No stenosis. L4-5: Moderate facet hypertrophy, right worse than left. Normal disc. No spinal canal or neural foraminal stenosis. L5-S1: Moderate facet hypertrophy. No disc herniation or stenosis. IMPRESSION: 1. No thoracic or lumbar spinal canal or neural foraminal stenosis. 2. Small central disc protrusion at C7-T1 indenting the ventral spinal cord. No associated spinal canal stenosis. 3. Moderate lower lumbar facet arthrosis, which may be a source of local low back pain. Electronically Signed   By: Ulyses Jarred M.D.   On: 11/09/2019  04:17   Mr Lumbar Spine Wo Contrast  Result Date: 11/09/2019 CLINICAL DATA:  Back pain with right lower extremity numbness and weakness. EXAM: MRI THORACIC AND LUMBAR SPINE WITHOUT CONTRAST TECHNIQUE: Multiplanar and multiecho pulse sequences of the thoracic and lumbar spine were obtained without intravenous contrast. COMPARISON:  CT lumbar spine 11/08/2019 FINDINGS: MRI THORACIC SPINE FINDINGS Alignment:  Physiologic. Vertebrae: No fracture, evidence of discitis, or bone lesion. Cord:  Normal signal and morphology. Paraspinal and other soft tissues: Negative. Disc levels: C7-T1: Small central disc protrusion indenting the ventral spinal cord. Otherwise, the thoracic disc levels are normal. MRI LUMBAR SPINE FINDINGS Segmentation:  Stand Alignment:  Normal Vertebrae:  Normal Conus medullaris and cauda equina: Conus extends to the L1 level. Conus and cauda equina appear normal. Paraspinal and other soft tissues: Markedly distended urinary bladder. Cystic focus in the left lower quadrant. Disc levels: L1-2: Normal L2-3: Mild facet hypertrophy. Normal disc. No stenosis. L3-4: Mild facet hypertrophy. Normal disc. No stenosis. L4-5: Moderate facet hypertrophy, right worse than left. Normal disc. No spinal canal or neural foraminal stenosis. L5-S1: Moderate facet hypertrophy. No disc herniation or stenosis. IMPRESSION: 1. No thoracic or lumbar spinal canal or neural foraminal stenosis. 2. Small central disc protrusion at C7-T1 indenting the ventral spinal cord. No associated spinal canal stenosis. 3. Moderate lower lumbar facet arthrosis, which may be a source of local low back pain. Electronically Signed   By: Ulyses Jarred M.D.   On: 11/09/2019 04:17   Ct Abdomen Pelvis W Contrast  Result Date: 11/08/2019 CLINICAL DATA:  Fall while walking from living room to kitchen, history of seizures, no noted seizure activity, anticoagulated EXAM: CT ANGIOGRAPHY CHEST CT ABDOMEN AND PELVIS WITH CONTRAST TECHNIQUE:  Multidetector CT imaging of the chest was performed using the standard protocol during bolus administration of intravenous contrast. Multiplanar CT image reconstructions and MIPs were obtained to evaluate the vascular anatomy. Multidetector CT imaging of the abdomen and pelvis was performed using the standard protocol during bolus administration of intravenous contrast. CONTRAST:  165mL OMNIPAQUE IOHEXOL 350 MG/ML SOLN COMPARISON:  CT abdomen pelvis 10/20/2017, CTA chest 05/12/2015 FINDINGS:  CTA CHEST FINDINGS Cardiovascular: Satisfactory opacification the pulmonary arteries to the segmental level. Stable web-like filling defects are present in branching of the left lower lobar artery. No acute hypoattenuating filling defect is seen. Central pulmonary arteries are normal caliber. Normal caliber of the thoracic aorta. No acute luminal abnormality, periaortic stranding or hemorrhage. Normal 3 vessel branching of the aortic arch. Normal heart size. No pericardial effusion. Mediastinum/Nodes: No enlarged mediastinal, hilar, or axillary lymph nodes. Thyroid gland and thoracic inlet are unremarkable. No acute abnormality of the trachea. Small amount of ingested material within the esophagus. Lungs/Pleura: Numerous pulmonary cysts are similar to prior. Mosaic attenuation within the lungs likely related to imaging during exhalation though a possible air trapping or small airways disease could have a similar appearance. No consolidative opacity. No acute traumatic abnormality of the lung parenchyma. Basilar atelectatic changes are noted. No pneumothorax or effusion. Musculoskeletal: No chest wall abnormality. No acute or significant osseous findings. Prominent vascular channel in the left anterior articular facet of T9 is stable from prior. Review of the MIP images confirms the above findings. CT ABDOMEN and PELVIS FINDINGS Hepatobiliary: No focal hepatic injury or perihepatic hematoma. Diffuse hepatic hypoattenuation  compatible with hepatic steatosis. No focal liver abnormality is seen. Patient is post cholecystectomy. Slight prominence of the biliary tree likely related to reservoir effect. No calcified intraductal gallstones. Pancreas: Unremarkable. No pancreatic ductal dilatation or surrounding inflammatory changes. Spleen: Normal arterial enhancement of the spleen. No splenic injury or perisplenic hematoma. Adrenals/Urinary Tract: No adrenal hematoma or suspicious adrenal lesions. Kidneys enhance and excrete symmetrically. No direct renal injury or perirenal hematoma. No suspicious renal lesions, urolithiasis or hydronephrosis. No acute bladder injury or other abnormality. Stomach/Bowel: Distal esophagus, stomach and duodenal sweep are unremarkable. No small bowel wall thickening or dilatation. No evidence of obstruction. A normal appendix is visualized. No colonic dilatation or wall thickening. Vascular/Lymphatic: The aorta is normal caliber. No suspicious or enlarged lymph nodes in the included lymphatic chains. Reproductive: Uterus is surgically absent. Retained ovaries. 2.9 cm cyst adjacent the left ovary. No other concerning adnexal lesion. Other: No free fluid or free air. No traumatic abdominal injury. No bowel containing hernias. Musculoskeletal: Multilevel degenerative changes are present in the imaged portions of the spine. Additional degenerative changes in the hips. No acute osseous abnormality or suspicious osseous lesion. Few stable bone islands in the left pelvis. Review of the MIP images confirms the above findings. IMPRESSION: 1. No acute traumatic injury of the chest, abdomen or pelvis. 2. Stable web-like filling defects in the branching of the left lower lobar artery, favored to represent chronic pulmonary emboli. 3. No acute pulmonary arterial filling defects are identified. No evidence of right heart strain. 4. Mosaic attenuation of the lungs may reflect imaging during exhalation or underlying air  trapping/small airways disease. 5. 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 Consider nonemergent high-resolution chest CT for further evaluation. 6. Post hysterectomy with retained ovaries. 2.9 cm left adnexal cyst in the left ovary is within physiologic normal for a premenopausal female. 7. Hepatic steatosis. 8. Aortic Atherosclerosis (ICD10-I70.0) Electronically Signed   By: Lovena Le M.D.   On: 11/08/2019 23:42   Dg Pelvis Portable  Result Date: 11/08/2019 CLINICAL DATA:  Fall EXAM: PORTABLE PELVIS 1-2 VIEWS COMPARISON:  Radiograph 01/02/2008 FINDINGS: Appearance worrisome for a minimally impacted subcapital left femoral neck fracture. Femoral heads remain normally located. No other acute pelvic fracture or traumatic diastasis. Degenerative changes noted in the SI  joints and both hips. Included portions of the lumbar spine are unremarkable aside from mild discogenic change. Bowel gas pattern is normal. Soft tissues are unremarkable. IMPRESSION: Findings worrisome for a minimally impacted subcapital left femoral neck fracture. Electronically Signed   By: Lovena Le M.D.   On: 11/08/2019 21:41   Ct L-spine No Charge  Result Date: 11/08/2019 CLINICAL DATA:  Fall EXAM: CT LUMBAR SPINE WITHOUT CONTRAST TECHNIQUE: Multiplanar CT reconstructions of the lumbar spine were performed from concurrent CT of the chest, abdomen and pelvis. COMPARISON:  CT abdomen pelvis 10/20/2017, CT L-spine 02/03/2013 FINDINGS: Segmentation: 5 lumbar type vertebrae. Alignment: Preservation of the normal lumbar lordosis. Mild grade 1 anterolisthesis L5 on S1 is stable from comparison in 2014. Posterior elements are normally aligned without abnormal facet widening. Vertebrae: No acute vertebral body fracture, height loss other focal pathologic process. Posterior elements are intact. Included portions of the sacrum and pelvis are unremarkable aside from vacuum  phenomenon in both SI joints and mild SI joint widening, similar to comparison. Paraspinal and other soft tissues: No paravertebral fluid or swelling. Mild posterior body wall edema. For findings in the abdomen and pelvis, please see dedicated CT from which this study is generated. Disc levels: Minimal posterior disc bulging at L4-5 and L5-S1 without significant canal stenosis. Posterior facet hypertrophic changes are most pronounced at the same levels with resulting mild bilateral foraminal narrowing at L4-5 and moderate bilateral neural foraminal narrowing at L5-S1. IMPRESSION: 1. No acute fracture or subluxation of the lumbar spine. 2. Mild grade 1 anterolisthesis L5 on S1 is stable from comparison in 2014. 3. Posterior facet hypertrophic changes at L4-5 and L5-S1 resulting in mild bilateral foraminal narrowing at L4-5 and moderate bilateral neural foraminal narrowing at L5-S1. 4. For findings in the abdomen and pelvis, please see dedicated CT from which this study is generated. Electronically Signed   By: Lovena Le M.D.   On: 11/08/2019 23:51   Dg Chest Portable 1 View  Result Date: 11/08/2019 CLINICAL DATA:  Fall, chest pain EXAM: PORTABLE CHEST 1 VIEW COMPARISON:  02/27/2018 FINDINGS: Heart and mediastinal contours are within normal limits. No focal opacities or effusions. No acute bony abnormality. IMPRESSION: No active disease. Electronically Signed   By: Rolm Baptise M.D.   On: 11/08/2019 21:40     Antimicrobials:   none   Subjective: No acute changes overnight still with significant weakness  Objective: Vitals:   11/11/19 2025 11/12/19 0409 11/12/19 0804 11/12/19 0932  BP: 105/74 125/83  115/77  Pulse: 74 75 70 77  Resp: 15 16 16 18   Temp: 98.4 F (36.9 C) 97.7 F (36.5 C)  97.6 F (36.4 C)  TempSrc: Oral Oral  Oral  SpO2: 95% 96% 96% 99%  Weight:  73.9 kg    Height:        Intake/Output Summary (Last 24 hours) at 11/12/2019 1343 Last data filed at 11/12/2019 1029 Gross  per 24 hour  Intake 1080 ml  Output 2650 ml  Net -1570 ml   Filed Weights   11/10/19 0442 11/11/19 0409 11/12/19 0409  Weight: 75 kg 75.5 kg 73.9 kg    Examination:  General exam: Appears calm and comfortable  Respiratory system: Clear to auscultation. Respiratory effort normal. Cardiovascular system: S1 & S2 heard, RRR. No JVD, murmurs, rubs, gallops or clicks. No pedal edema. Gastrointestinal system: Abdomen is nondistended, soft and nontender. No organomegaly or masses felt. Normal bowel sounds heard. Central nervous system: Alert and oriented.  Right lower  extremity weakness as below3-4/5 Extremities: Asymmetrical weakness lower extremity> UE Skin: No rashes, lesions or ulcers Psychiatry: Judgement and insight appear normal. Mood & affect .    Data Reviewed: I have personally reviewed following labs and imaging studies  CBC: Recent Labs  Lab 11/08/19 2116 11/09/19 0603 11/09/19 2326 11/10/19 0454 11/11/19 0358 11/12/19 0622  WBC 10.0 10.7* 10.7* 10.4 10.1 11.6*  NEUTROABS 4.4  --  5.0 5.0 3.9 5.5  HGB 14.0 12.8 11.9* 11.9* 11.5* 12.0  HCT 42.5 40.5 36.7 37.2 36.4 37.4  MCV 79.3* 80.5 79.8* 80.9 81.4 81.1  PLT 314 312 290 291 294 [id number]   Basic Metabolic Panel: Recent Labs  Lab 11/08/19 2116 11/09/19 0603 11/10/19 0454 11/11/19 0358 11/12/19 0622  NA 133* 135 136 138 137  K 4.0 3.9 3.5 3.7 3.5  CL 95* 97* 98 103 99  CO2 26 28 29 25 28   GLUCOSE 390* 290* 202* 185* 211*  BUN 9 8 13 11 9   CREATININE 0.82 0.72 1.01* 0.82 0.75  CALCIUM 9.7 9.3 8.5* 8.3* 9.0   GFR: Estimated Creatinine Clearance: 77.2 mL/min (by C-G formula based on SCr of 0.75 mg/dL). Liver Function Tests: Recent Labs  Lab 11/08/19 2116  AST 36  ALT 44  ALKPHOS 64  BILITOT 0.5  PROT 8.2*  ALBUMIN 3.8   No results for input(s): LIPASE, AMYLASE in the last 168 hours. No results for input(s): AMMONIA in the last 168 hours. Coagulation Profile: No results for input(s): INR, PROTIME in  the last 168 hours. Cardiac Enzymes: No results for input(s): CKTOTAL, CKMB, CKMBINDEX, TROPONINI in the last 168 hours. BNP (last 3 results) No results for input(s): PROBNP in the last 8760 hours. HbA1C: No results for input(s): HGBA1C in the last 72 hours. CBG: Recent Labs  Lab 11/11/19 1119 11/11/19 1616 11/11/19 2046 11/12/19 0653 11/12/19 1152  GLUCAP 197* 204* 161* 198* 201*   Lipid Profile: Recent Labs    11/10/19 0454  CHOL 163  HDL 38*  LDLCALC 103*  TRIG 108  CHOLHDL 4.3   Thyroid Function Tests: No results for input(s): TSH, T4TOTAL, FREET4, T3FREE, THYROIDAB in the last 72 hours. Anemia Panel: No results for input(s): VITAMINB12, FOLATE, FERRITIN, TIBC, IRON, RETICCTPCT in the last 72 hours. Sepsis Labs: No results for input(s): PROCALCITON, LATICACIDVEN in the last 168 hours.  Recent Results (from the past 240 hour(s))  SARS CORONAVIRUS 2 (TAT 6-24 HRS) Nasopharyngeal Nasopharyngeal Swab     Status: None   Collection Time: 11/09/19 12:06 AM   Specimen: Nasopharyngeal Swab  Result Value Ref Range Status   SARS Coronavirus 2 NEGATIVE NEGATIVE Final    Comment: (NOTE) SARS-CoV-2 target nucleic acids are NOT DETECTED. The SARS-CoV-2 RNA is generally detectable in upper and lower respiratory specimens during the acute phase of infection. Negative results do not preclude SARS-CoV-2 infection, do not rule out co-infections with other pathogens, and should not be used as the sole basis for treatment or other patient management decisions. Negative results must be combined with clinical observations, patient history, and epidemiological information. The expected result is Negative. Fact Sheet for Patients: SugarRoll.be Fact Sheet for Healthcare Providers: https://www.woods-mathews.com/ This test is not yet approved or cleared by the [place] FDA and  has been authorized for detection and/or diagnosis of SARS-CoV-2  by FDA under an Emergency Use Authorization (EUA). This EUA will remain  in effect (meaning this test can be used) for the duration of the COVID-19 declaration under Section 56 4(b)(1) of the  Act, 21 U.S.C. section 360bbb-3(b)(1), unless the authorization is terminated or revoked sooner. Performed at [facility], [place] [address], [place], [place] [address]          Radiology Studies: No results found.      Scheduled Meds:  amLODipine  10 mg Oral QHS   atorvastatin  10 mg Oral q1800   Chlorhexidine Gluconate Cloth  6 each Topical Daily   escitalopram  10 mg Oral QHS   insulin aspart  0-15 Units Subcutaneous TID WC   insulin aspart  0-5 Units Subcutaneous QHS   levETIRAcetam  500 mg Oral BID   lisinopril  2.5 mg Oral Daily   mometasone-formoterol  2 puff Inhalation BID   montelukast  10 mg Oral QHS   pregabalin  100 mg Oral TID   rivaroxaban  20 mg Oral Q supper   sodium chloride flush  3 mL Intravenous Q12H   sodium chloride flush  3 mL Intravenous Q12H   topiramate  25 mg Oral QHS   Continuous Infusions:  sodium chloride       LOS: 2 days    Time spent: 51 MIN    Nicolette Bang, MD [facility]  If 7PM-7AM, please contact night-coverage  11/12/2019, 1:43 PM

## 2019-11-13 DIAGNOSIS — R29898 Other symptoms and signs involving the musculoskeletal system: Secondary | ICD-10-CM

## 2019-11-13 LAB — CBC WITH DIFFERENTIAL/PLATELET
Abs Immature Granulocytes: 0.02 10*3/uL (ref 0.00–0.07)
Basophils Absolute: 0.1 10*3/uL (ref 0.0–0.1)
Basophils Relative: 1 %
Eosinophils Absolute: 0.2 10*3/uL (ref 0.0–0.5)
Eosinophils Relative: 2 %
HCT: 37.1 % (ref 36.0–46.0)
Hemoglobin: 12 g/dL (ref 12.0–15.0)
Immature Granulocytes: 0 %
Lymphocytes Relative: 44 %
Lymphs Abs: 5.2 10*3/uL — ABNORMAL HIGH (ref 0.7–4.0)
MCH: 26.1 pg (ref 26.0–34.0)
MCHC: 32.3 g/dL (ref 30.0–36.0)
MCV: 80.7 fL (ref 80.0–100.0)
Monocytes Absolute: 0.7 10*3/uL (ref 0.1–1.0)
Monocytes Relative: 6 %
Neutro Abs: 5.8 10*3/uL (ref 1.7–7.7)
Neutrophils Relative %: 47 %
Platelets: 302 10*3/uL (ref 150–400)
RBC: 4.6 MIL/uL (ref 3.87–5.11)
RDW: 14.1 % (ref 11.5–15.5)
WBC: 12 10*3/uL — ABNORMAL HIGH (ref 4.0–10.5)
nRBC: 0 % (ref 0.0–0.2)

## 2019-11-13 LAB — BASIC METABOLIC PANEL
Anion gap: 7 (ref 5–15)
BUN: 11 mg/dL (ref 6–20)
CO2: 29 mmol/L (ref 22–32)
Calcium: 8.8 mg/dL — ABNORMAL LOW (ref 8.9–10.3)
Chloride: 100 mmol/L (ref 98–111)
Creatinine, Ser: 0.94 mg/dL (ref 0.44–1.00)
GFR calc Af Amer: >60 mL/min (ref >60)
GFR calc non Af Amer: >60 mL/min (ref >60)
Glucose, Bld: 255 mg/dL — ABNORMAL HIGH (ref 70–99)
Potassium: 3.6 mmol/L (ref 3.5–5.1)
Sodium: 136 mmol/L (ref 135–145)

## 2019-11-13 LAB — GLUCOSE, CAPILLARY
Glucose-Capillary: 191 mg/dL — ABNORMAL HIGH (ref 70–99)
Glucose-Capillary: 252 mg/dL — ABNORMAL HIGH (ref 70–99)
Glucose-Capillary: 191 mg/dL — ABNORMAL HIGH (ref 70–99)

## 2019-11-13 MED ORDER — LEVETIRACETAM 500 MG PO TABS: 500.0000 mg | ORAL_TABLET | Freq: Two times a day (BID) | ORAL | 0 refills | Status: DC

## 2019-11-13 NOTE — TOC Initial Note (Addendum)
Transition of Care [facility]) - Initial/Assessment Note    Patient Details  Name: Makayla Frazier MRN: [id number] Date of Birth: January 04, 1971  Transition of Care [facility]) CM/SW Contact:    Bartholomew Crews, RN Phone Number: [phone] 11/13/2019, 1:41 PM  Clinical Narrative:                 Spoke with patient at the bedside. PTA home with spouse who is retired and able to provide 24/7 supervision. States her mom lives close and does meal prep. Has a bedside commode in the bedroom and one over toilet in guest bath.   Patient will need RW and WC. PT contacted for recommendations. MD contacted for DME orders.   Discussed HH PT and OT. Offered choice for home health agencies. [facility] declined referral. [facility] declined.   Alvis Lemmings accepted patient for [facility] PT and OT. Patient will need HH orders for PT and OT with [facility].   Discussed with patient about PCS services through [facility]. Patient in agreement. Form completed, signed by MD, and faxed to [facility] via expedited line. Call back received from [facility] who advised that the RN will call NCM to complete mini-assessment. Update: Received call from Anderson Malta, Therapist, sports at [employer]. Assessment completed. Referral to be sent to [facility] home care division for [facility]. Patient should receive call within 48 hours to schedule a visit.   Spouse to provide transportation home.   Patient is in process of voiding trial, and is anticipated to transition home later today. TOC following for transition needs.   Expected Discharge Plan: [place] Barriers to Discharge: No Barriers Identified   Patient Goals and CMS Choice Patient states their goals for this hospitalization and ongoing recovery are:: wants to go home CMS Medicare.gov Compare Post Acute Care list provided to:: Patient Choice offered to / list presented to : Patient  Expected Discharge Plan and Services Expected Discharge Plan: [place] In-house Referral: Clinical Social Work Discharge Planning Services: CM Consult Post Acute Care Choice: Durable Medical Equipment, Home Health Living arrangements for the past 2 months: Single Family Home                 DME Arranged: Walker rolling, Wheelchair manual DME Agency: [employer] Date DME Agency Contacted: 11/13/19 Time DME Agency Contacted: 93 Representative spoke with at DME Agency: [place] Arranged: PT, OT, PCS/Personal Care Services          Prior Living Arrangements/Services Living arrangements for the past 2 months: [place] with:: Self, Spouse Patient language and need for interpreter reviewed:: Yes Do you feel safe going back to the place where you live?: Yes      Need for Family Participation in Patient Care: Yes (Comment) Care giver support system in place?: Yes (comment) Current home services: DME Criminal Activity/Legal Involvement Pertinent to Current Situation/Hospitalization: No - Comment as needed  Activities of Daily Living Home Assistive Devices/Equipment: None ADL Screening (condition at time of admission) Patient's cognitive ability adequate to safely complete daily activities?: Yes Is the patient deaf or have difficulty hearing?: No Does the patient have difficulty seeing, even when wearing glasses/contacts?: No Does the patient have difficulty concentrating, remembering, or making decisions?: No Patient able to express need for assistance with ADLs?: Yes Does the patient have difficulty dressing or bathing?: Yes Independently performs ADLs?: No Does the patient have difficulty walking or climbing stairs?: Yes Weakness  of Legs: Both Weakness of Arms/Hands: Right  Permission Sought/Granted                  Emotional Assessment Appearance:: Appears stated age Attitude/Demeanor/Rapport: Engaged Affect (typically observed): Accepting Orientation: : Oriented to Situation, Oriented to  Time,  Oriented to Place, Oriented to Self Alcohol / Substance Use: Not Applicable Psych Involvement: No (comment)  Admission diagnosis:  Syncope and collapse [R55] Back pain [M54.9] Patient Active Problem List   Diagnosis Date Noted  . Transient loss of consciousness 11/09/2019  . Weakness of right leg 11/09/2019  . Multiple idiopathic pulmonary cysts 11/09/2019  . Hypertensive urgency 11/09/2019  . Reaction to severe stress 03/07/2016  . Bleeding per rectum 11/29/2015  . Acute pansinusitis 10/20/2015  . Candida vaginitis 10/20/2015  . Cerebrovascular accident, late effects 08/30/2015  . D (diarrhea) 08/02/2015  . Seizure disorder ([place]) 07/13/2015  . Adnexal pain 06/08/2015  . Right flank pain 05/30/2015  . Chronic pulmonary embolism ([place]) 05/14/2015  . Long term current use of anticoagulant 05/14/2015  . Seizures ([place]) 05/07/2014  . Absence of bladder continence 03/27/2014  . Arthropathia 02/25/2014  . Chronic chest pain 02/25/2014  . Healed or old pulmonary embolism 02/25/2014  . HLD (hyperlipidemia) 02/25/2014  . Headache, migraine 02/25/2014  . Obstructive apnea 02/25/2014  . Uncontrolled diabetes mellitus with hyperglycemia ([place]) 02/25/2014  . Incontinence 02/13/2014  . S/P vaginal hysterectomy 01/29/2013  . TIA (transient ischemic attack) 08/03/2012  . Left-sided weakness 08/03/2012  . Hypokalemia 08/03/2012  . Chest pain 08/03/2012  . Temporary cerebral vascular dysfunction 08/03/2012  . History of pulmonary embolism 05/18/2012  . Patellar tendinitis of left knee 08/21/2011  . Knee pain, left 07/07/2011  . PRURITUS 07/04/2010  . Syncope and collapse 10/18/2009  . Allergic rhinitis 08/11/2009  . Unspecified vitamin D deficiency 07/21/2009  . Adiposity 07/19/2009  . Apnea, sleep 07/19/2009  . ANEMIA 07/10/2008  . Essential hypertension 07/08/2008  . Insulin dependent diabetes mellitus with complications ([place]) [id number]  . Peripheral neuropathy 01/22/2008  . Asthma  01/22/2008  . Mononeuritis 01/22/2008   PCP:  Harvie Heck, MD Pharmacy:   [facility] [address] [place] [address] Phone: [phone] Fax: [phone]     Social Determinants of Health (SDOH) Interventions    Readmission Risk Interventions No flowsheet data found.

## 2019-11-13 NOTE — Progress Notes (Addendum)
  Physical therapy Progress Note  Patient suffers from R sided weakness and low endurance which impairs their ability to perform daily activities like taking steps and performing standing balance in the home.  A walker alone will not resolve the issues with performing activities of daily living. A wheelchair will allow patient to safely perform daily activities.  The patient can self propel in the home or has a caregiver who can provide assistance.  The patient requires a standard 18" width chair with removeable swing away legrests, and will need desk arms with a cushion for the chair.  Mee Hives, PT MS Acute Rehab Dept. Number: [place] and [place]

## 2019-11-13 NOTE — Progress Notes (Signed)
DISCHARGE NOTE HOME KHAIYA MOBILIA to be discharged Home per MD order. Discussed prescriptions and follow up appointments with the patient. Prescriptions given to patient; medication list explained in detail. Patient verbalized understanding.  Skin clean, dry and intact without evidence of skin break down, no evidence of skin tears noted. IV catheter discontinued intact. Site without signs and symptoms of complications. Dressing and pressure applied. Pt denies pain at the site currently. No complaints noted.  Patient free of lines, drains, and wounds.   An After Visit Summary (AVS) was printed and given to the patient. Patient escorted via wheelchair, and discharged home via private auto.  Orville Govern, RN

## 2019-11-13 NOTE — Discharge Summary (Signed)
Physician Discharge Summary  Makayla Frazier [id number] DOB: 1971/08/22 DOA: 11/08/2019  PCP: Harvie Heck, MD  Admit date: 11/08/2019 Discharge date: 11/13/2019  Admitted From: Inpatient Disposition: home  Recommendations for Outpatient Follow-up:  1. Follow up with PCP in 1-2 weeks   Home Health:Yes Equipment/Devices:wc, rw  Discharge Condition:Stable CODE STATUS:Full code Diet recommendation: Diabetic diet  Brief/Interim Summary: Makayla Jarmin Mileyis a 48 y.o.femalewith medical history significant forseizure disorder, PE on Xarelto, hypertension, type 2 diabetes mellitus, and asthma, now presenting to emergency department after a transient loss of consciousness and reports pain in the right side of her back as well as new right leg numbness and weakness. Patient reports that she been in her usual state of health and was having an uneventful day when she was lifting up a case of water bottles and then lost consciousness. Family was nearby,did not see her fall, but saw her immediately afterwards with no seizure-like activity. In the ED, patient noted to be tachycardic with hypertensive crisis, SBP in the 200s. Head CT and cervical spine CT are negative for acute findings. CT chest/abdomen/pelvis in the ED which is notable for suspected chronic PE involving the left lower lobar artery without acute PE or right heart strain. Labs notable for glucose of 390, UDS negative. Patient admitted for further management.  Hospital course: Transient loss of consciousness/syncope Ddx included seizures, none since patient was in the hospital Was seen by Neuro who felt it was not organic Head CT and cervical spine CT are negative for acute findings. CT chest/abdomen/pelvis in the ED which is notable for suspected chronic PE involving the left lower lobar artery without acute PE or right heart strain Echo with EF of 60 to [id number], grade 1 diastolic dysfunction EEGstudy within normal limits MRI  brainunremarkable Pt declined SNF and requested HH with PT/OT  Right leg weakness and numbness/??Todds paralysis Presents after transient LOC with fall and reports right-sided back pain with new right leg numbness and weakness MRI brainunremarkable MRI T- and L-spine showed moderate lower lumbar facet arthrosis, small central disc protrusion at C7-T1 indenting the ventral spinal cord, no associated spinal canal stenosis Neurology consulted, appreciate recs-felt presentation was non-organic PT/OT with recommendations for SNF vs HH   Acute urinary retention Unknown etiology, previously never had any episode ContinueD Foley,plan for voiding trial WHICH WAS SUCCESSFUL,  200CC  Hypertension Hypotensive episode notedmonitored closely,  Presented with hypertensive crisis Continue Norvasc, lisinopril,hold chlorthalidone for now,will adjust per BP readings  Seizure disorder Last seizure was ~6 months ago, and reports adherence with her antiepileptics(noted to be taking Keppra once daily instead of twice daily as it made her sick) EEGwithin normal limits ContinueKeppra twice daily Appreciate neuro input  Type II DM A1c 9.9, with reported prior of 8.4 Pt will f/up with her PCP for initiation of insulin if unable to control with diet She was instructed on close monitoring of bg achs and to notify her PCP if BG >200 Pt was resistant to insulin  Chronic PE Continue Xarelto on d/c  Asthma Continue ICS/LABA, Singulair, and as-needed albuterol  Obesity Lifestyle modification advised   Discharge Diagnoses:  Principal Problem:   Transient loss of consciousness Active Problems:   Essential hypertension   Chronic pulmonary embolism (HCC)   Seizure disorder (HCC)   Uncontrolled diabetes mellitus with hyperglycemia (HCC)   Weakness of right leg   Multiple idiopathic pulmonary cysts   Hypertensive urgency    Discharge Instructions  Discharge Instructions     Call MD  for:   Complete by: As directed    For any acute change in neuro status   Call MD for:  difficulty breathing, headache or visual disturbances   Complete by: As directed    Call MD for:  extreme fatigue   Complete by: As directed    Call MD for:  hives   Complete by: As directed    Call MD for:  persistant dizziness or light-headedness   Complete by: As directed    Call MD for:  persistant nausea and vomiting   Complete by: As directed    Call MD for:  severe uncontrolled pain   Complete by: As directed    Call MD for:  temperature >100.4   Complete by: As directed    Diet Carb Modified   Complete by: As directed    Increase activity slowly   Complete by: As directed      Allergies as of 11/13/2019      Reactions   Silver Other (See Comments), Rash   Codeine Nausea And Vomiting   Pt takes Percocet without problems   Darvocet [propoxyphene N-acetaminophen] Nausea And Vomiting   Hydrocodone-acetaminophen Nausea And Vomiting   Hydrocodone-acetaminophen Nausea And Vomiting   Vomiting   Latex Rash   Tape Itching, Rash   Tramadol Nausea Only      Medication List    TAKE these medications   acetaminophen 500 MG tablet Commonly known as: TYLENOL Take 1,000 mg by mouth every 6 (six) hours as needed for moderate pain.   albuterol 108 (90 Base) MCG/ACT inhaler Commonly known as: VENTOLIN HFA Inhale 2 puffs into the lungs every 6 (six) hours as needed for wheezing or shortness of breath.   amLODipine 5 MG tablet Commonly known as: NORVASC Take 2 tablets (10 mg total) by mouth daily. What changed: when to take this   atorvastatin 10 MG tablet Commonly known as: LIPITOR Take 10 mg by mouth at bedtime.   cetirizine 10 MG tablet Commonly known as: ZYRTEC Take 1 tablet (10 mg total) by mouth daily. What changed: when to take this   chlorthalidone 25 MG tablet Commonly known as: HYGROTON TAKE 2 TABLETS(50 MG) BY MOUTH DAILY What changed: See the new  instructions.   EPINEPHrine 0.3 mg/0.3 mL Soaj injection Commonly known as: EPI-PEN Inject 0.3 mg into the muscle as needed for anaphylaxis.   escitalopram 10 MG tablet Commonly known as: LEXAPRO Take 10 mg by mouth at bedtime.   fluticasone 50 MCG/ACT nasal spray Commonly known as: FLONASE Place 1 spray into both nostrils daily as needed (for nasal congestion/sinus infection).   levETIRAcetam 500 MG tablet Commonly known as: KEPPRA Take 500 mg by mouth at bedtime.   lisinopril 5 MG tablet Commonly known as: ZESTRIL Take 2.5 mg by mouth daily.   montelukast 10 MG tablet Commonly known as: SINGULAIR Take 10 mg by mouth at bedtime. Reported on 02/16/2016   ondansetron 4 MG tablet Commonly known as: ZOFRAN Take 4 mg by mouth as needed for nausea/vomiting.   pregabalin 100 MG capsule Commonly known as: LYRICA TAKE 1 CAPSULE(100 MG) BY MOUTH THREE TIMES DAILY What changed: See the new instructions.   rizatriptan 5 MG disintegrating tablet Commonly known as: Maxalt-MLT Take 1 tablet (5 mg total) by mouth as needed for migraine. May repeat in 2 hours if needed   Symbicort 160-4.5 MCG/ACT inhaler Generic drug: budesonide-formoterol Inhale 2 puffs into the lungs 2 (two) times daily.   topiramate 25 MG tablet Commonly  known as: TOPAMAX Take 25 mg by mouth at bedtime.   Trulicity 1.5 0000000 Sopn Generic drug: Dulaglutide Inject 1.5 mg into the skin once a week. Fridays   Xarelto 20 MG Tabs tablet Generic drug: rivaroxaban Take 20 mg by mouth at bedtime.            Durable Medical Equipment  (From admission, onward)         Start     Ordered   11/13/19 1455  For home use only DME Walker rolling  [facility])  Once    Question:  Patient needs a walker to treat with the following condition  Answer:  Weakness   11/13/19 1457   11/13/19 1454  For home use only DME standard manual wheelchair with seat cushion  (Wheelchairs)  Once    Comments: Patient suffers from rle  weakness which impairs their ability to perform daily activities like bathing and dressing in the home.  A cane, crutch or walker will not resolve issue with performing activities of daily living. A wheelchair will allow patient to safely perform daily activities. Patient can safely propel the wheelchair in the home or has a caregiver who can provide assistance. Length of need 6 months . Accessories: elevating leg rests (ELRs), wheel locks, extensions and anti-tippers.   11/13/19 1457         Follow-up Information    [facility] Follow up.   Specialty: Home Health Services Why: physical and occupational therapy Contact information: [address] [place] [address] [phone]          Allergies  Allergen Reactions  . Silver Other (See Comments) and Rash  . Codeine Nausea And Vomiting    Pt takes Percocet without problems   . Darvocet [Propoxyphene N-Acetaminophen] Nausea And Vomiting  . Hydrocodone-Acetaminophen Nausea And Vomiting  . Hydrocodone-Acetaminophen Nausea And Vomiting    Vomiting  . Latex Rash  . Tape Itching and Rash  . Tramadol Nausea Only    Consultations:  neuro   Procedures/Studies: Ct Head Wo Contrast  Result Date: 11/08/2019 CLINICAL DATA:  Syncope, fall. EXAM: CT HEAD WITHOUT CONTRAST TECHNIQUE: Contiguous axial images were obtained from the base of the skull through the vertex without intravenous contrast. COMPARISON:  05/25/2016 FINDINGS: Brain: No acute intracranial abnormality. Specifically, no hemorrhage, hydrocephalus, mass lesion, acute infarction, or significant intracranial injury. Vascular: No hyperdense vessel or unexpected calcification. Skull: No acute calvarial abnormality. Sinuses/Orbits: Visualized paranasal sinuses and mastoids clear. Orbital soft tissues unremarkable. Other: None IMPRESSION: Normal study. Electronically Signed   By: Rolm Baptise M.D.   On: 11/08/2019 23:27   Ct Angio Chest Pe W And/or Wo  Contrast  Result Date: 11/08/2019 CLINICAL DATA:  Fall while walking from living room to kitchen, history of seizures, no noted seizure activity, anticoagulated EXAM: CT ANGIOGRAPHY CHEST CT ABDOMEN AND PELVIS WITH CONTRAST TECHNIQUE: Multidetector CT imaging of the chest was performed using the standard protocol during bolus administration of intravenous contrast. Multiplanar CT image reconstructions and MIPs were obtained to evaluate the vascular anatomy. Multidetector CT imaging of the abdomen and pelvis was performed using the standard protocol during bolus administration of intravenous contrast. CONTRAST:  14mL OMNIPAQUE IOHEXOL 350 MG/ML SOLN COMPARISON:  CT abdomen pelvis 10/20/2017, CTA chest 05/12/2015 FINDINGS: CTA CHEST FINDINGS Cardiovascular: Satisfactory opacification the pulmonary arteries to the segmental level. Stable web-like filling defects are present in branching of the left lower lobar artery. No acute hypoattenuating filling defect is seen. Central pulmonary arteries  are normal caliber. Normal caliber of the thoracic aorta. No acute luminal abnormality, periaortic stranding or hemorrhage. Normal 3 vessel branching of the aortic arch. Normal heart size. No pericardial effusion. Mediastinum/Nodes: No enlarged mediastinal, hilar, or axillary lymph nodes. Thyroid gland and thoracic inlet are unremarkable. No acute abnormality of the trachea. Small amount of ingested material within the esophagus. Lungs/Pleura: Numerous pulmonary cysts are similar to prior. Mosaic attenuation within the lungs likely related to imaging during exhalation though a possible air trapping or small airways disease could have a similar appearance. No consolidative opacity. No acute traumatic abnormality of the lung parenchyma. Basilar atelectatic changes are noted. No pneumothorax or effusion. Musculoskeletal: No chest wall abnormality. No acute or significant osseous findings. Prominent vascular channel in the left  anterior articular facet of T9 is stable from prior. Review of the MIP images confirms the above findings. CT ABDOMEN and PELVIS FINDINGS Hepatobiliary: No focal hepatic injury or perihepatic hematoma. Diffuse hepatic hypoattenuation compatible with hepatic steatosis. No focal liver abnormality is seen. Patient is post cholecystectomy. Slight prominence of the biliary tree likely related to reservoir effect. No calcified intraductal gallstones. Pancreas: Unremarkable. No pancreatic ductal dilatation or surrounding inflammatory changes. Spleen: Normal arterial enhancement of the spleen. No splenic injury or perisplenic hematoma. Adrenals/Urinary Tract: No adrenal hematoma or suspicious adrenal lesions. Kidneys enhance and excrete symmetrically. No direct renal injury or perirenal hematoma. No suspicious renal lesions, urolithiasis or hydronephrosis. No acute bladder injury or other abnormality. Stomach/Bowel: Distal esophagus, stomach and duodenal sweep are unremarkable. No small bowel wall thickening or dilatation. No evidence of obstruction. A normal appendix is visualized. No colonic dilatation or wall thickening. Vascular/Lymphatic: The aorta is normal caliber. No suspicious or enlarged lymph nodes in the included lymphatic chains. Reproductive: Uterus is surgically absent. Retained ovaries. 2.9 cm cyst adjacent the left ovary. No other concerning adnexal lesion. Other: No free fluid or free air. No traumatic abdominal injury. No bowel containing hernias. Musculoskeletal: Multilevel degenerative changes are present in the imaged portions of the spine. Additional degenerative changes in the hips. No acute osseous abnormality or suspicious osseous lesion. Few stable bone islands in the left pelvis. Review of the MIP images confirms the above findings. IMPRESSION: 1. No acute traumatic injury of the chest, abdomen or pelvis. 2. Stable web-like filling defects in the branching of the left lower lobar artery, favored  to represent chronic pulmonary emboli. 3. No acute pulmonary arterial filling defects are identified. No evidence of right heart strain. 4. Mosaic attenuation of the lungs may reflect imaging during exhalation or underlying air trapping/small airways disease. 5. 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 Consider nonemergent high-resolution chest CT for further evaluation. 6. Post hysterectomy with retained ovaries. 2.9 cm left adnexal cyst in the left ovary is within physiologic normal for a premenopausal female. 7. Hepatic steatosis. 8. Aortic Atherosclerosis (ICD10-I70.0) Electronically Signed   By: Lovena Le M.D.   On: 11/08/2019 23:42   Ct Cervical Spine Wo Contrast  Result Date: 11/08/2019 CLINICAL DATA:  Syncope, fall EXAM: CT CERVICAL SPINE WITHOUT CONTRAST TECHNIQUE: Multidetector CT imaging of the cervical spine was performed without intravenous contrast. Multiplanar CT image reconstructions were also generated. COMPARISON:  05/18/2012 FINDINGS: Alignment: No subluxation.  Loss of cervical lordosis. Skull base and vertebrae: No acute fracture. No primary bone lesion or focal pathologic process. Soft tissues and spinal canal: No prevertebral fluid or swelling. No visible canal hematoma. Disc levels: Early spurring. Ossification of the  posterior longitudinal ligament. Upper chest: Negative Other: None IMPRESSION: No acute bony abnormality. Electronically Signed   By: Rolm Baptise M.D.   On: 11/08/2019 23:28   Mr Brain Wo Contrast  Result Date: 11/10/2019 CLINICAL DATA:  Syncope. Personal history of seizures. Transient loss of consciousness. Right leg numbness and weakness. EXAM: MRI HEAD WITHOUT CONTRAST TECHNIQUE: Multiplanar, multiecho pulse sequences of the brain and surrounding structures were obtained without intravenous contrast. COMPARISON:  CT head without contrast 11/08/2019 FINDINGS: Brain: The diffusion-weighted  images demonstrate no acute or subacute infarction. Partially empty sella is again noted without significant interval change. Midline structures are otherwise unremarkable. No significant white matter lesions are present. The ventricles are of normal size. No significant extraaxial fluid collection is present. The internal auditory canals are within normal limits. The brainstem and cerebellum are within normal limits. Vascular: Flow is present in the major intracranial arteries. Skull and upper cervical spine: The craniocervical junction is normal. Upper cervical spine is within normal limits. Marrow signal is unremarkable. Sinuses/Orbits: The paranasal sinuses and mastoid air cells are clear. The globes and orbits are within normal limits. IMPRESSION: Normal MRI appearance the brain. No acute or focal lesion to explain the patient's symptoms. Electronically Signed   By: San Morelle M.D.   On: 11/10/2019 05:10   Mr Thoracic Spine Wo Contrast  Result Date: 11/09/2019 CLINICAL DATA:  Back pain with right lower extremity numbness and weakness. EXAM: MRI THORACIC AND LUMBAR SPINE WITHOUT CONTRAST TECHNIQUE: Multiplanar and multiecho pulse sequences of the thoracic and lumbar spine were obtained without intravenous contrast. COMPARISON:  CT lumbar spine 11/08/2019 FINDINGS: MRI THORACIC SPINE FINDINGS Alignment:  Physiologic. Vertebrae: No fracture, evidence of discitis, or bone lesion. Cord:  Normal signal and morphology. Paraspinal and other soft tissues: Negative. Disc levels: C7-T1: Small central disc protrusion indenting the ventral spinal cord. Otherwise, the thoracic disc levels are normal. MRI LUMBAR SPINE FINDINGS Segmentation:  Stand Alignment:  Normal Vertebrae:  Normal Conus medullaris and cauda equina: Conus extends to the L1 level. Conus and cauda equina appear normal. Paraspinal and other soft tissues: Markedly distended urinary bladder. Cystic focus in the left lower quadrant. Disc levels:  L1-2: Normal L2-3: Mild facet hypertrophy. Normal disc. No stenosis. L3-4: Mild facet hypertrophy. Normal disc. No stenosis. L4-5: Moderate facet hypertrophy, right worse than left. Normal disc. No spinal canal or neural foraminal stenosis. L5-S1: Moderate facet hypertrophy. No disc herniation or stenosis. IMPRESSION: 1. No thoracic or lumbar spinal canal or neural foraminal stenosis. 2. Small central disc protrusion at C7-T1 indenting the ventral spinal cord. No associated spinal canal stenosis. 3. Moderate lower lumbar facet arthrosis, which may be a source of local low back pain. Electronically Signed   By: Ulyses Jarred M.D.   On: 11/09/2019 04:17   Mr Lumbar Spine Wo Contrast  Result Date: 11/09/2019 CLINICAL DATA:  Back pain with right lower extremity numbness and weakness. EXAM: MRI THORACIC AND LUMBAR SPINE WITHOUT CONTRAST TECHNIQUE: Multiplanar and multiecho pulse sequences of the thoracic and lumbar spine were obtained without intravenous contrast. COMPARISON:  CT lumbar spine 11/08/2019 FINDINGS: MRI THORACIC SPINE FINDINGS Alignment:  Physiologic. Vertebrae: No fracture, evidence of discitis, or bone lesion. Cord:  Normal signal and morphology. Paraspinal and other soft tissues: Negative. Disc levels: C7-T1: Small central disc protrusion indenting the ventral spinal cord. Otherwise, the thoracic disc levels are normal. MRI LUMBAR SPINE FINDINGS Segmentation:  Stand Alignment:  Normal Vertebrae:  Normal Conus medullaris and cauda equina: Conus extends to  the L1 level. Conus and cauda equina appear normal. Paraspinal and other soft tissues: Markedly distended urinary bladder. Cystic focus in the left lower quadrant. Disc levels: L1-2: Normal L2-3: Mild facet hypertrophy. Normal disc. No stenosis. L3-4: Mild facet hypertrophy. Normal disc. No stenosis. L4-5: Moderate facet hypertrophy, right worse than left. Normal disc. No spinal canal or neural foraminal stenosis. L5-S1: Moderate facet hypertrophy.  No disc herniation or stenosis. IMPRESSION: 1. No thoracic or lumbar spinal canal or neural foraminal stenosis. 2. Small central disc protrusion at C7-T1 indenting the ventral spinal cord. No associated spinal canal stenosis. 3. Moderate lower lumbar facet arthrosis, which may be a source of local low back pain. Electronically Signed   By: Ulyses Jarred M.D.   On: 11/09/2019 04:17   Ct Abdomen Pelvis W Contrast  Result Date: 11/08/2019 CLINICAL DATA:  Fall while walking from living room to kitchen, history of seizures, no noted seizure activity, anticoagulated EXAM: CT ANGIOGRAPHY CHEST CT ABDOMEN AND PELVIS WITH CONTRAST TECHNIQUE: Multidetector CT imaging of the chest was performed using the standard protocol during bolus administration of intravenous contrast. Multiplanar CT image reconstructions and MIPs were obtained to evaluate the vascular anatomy. Multidetector CT imaging of the abdomen and pelvis was performed using the standard protocol during bolus administration of intravenous contrast. CONTRAST:  171mL OMNIPAQUE IOHEXOL 350 MG/ML SOLN COMPARISON:  CT abdomen pelvis 10/20/2017, CTA chest 05/12/2015 FINDINGS: CTA CHEST FINDINGS Cardiovascular: Satisfactory opacification the pulmonary arteries to the segmental level. Stable web-like filling defects are present in branching of the left lower lobar artery. No acute hypoattenuating filling defect is seen. Central pulmonary arteries are normal caliber. Normal caliber of the thoracic aorta. No acute luminal abnormality, periaortic stranding or hemorrhage. Normal 3 vessel branching of the aortic arch. Normal heart size. No pericardial effusion. Mediastinum/Nodes: No enlarged mediastinal, hilar, or axillary lymph nodes. Thyroid gland and thoracic inlet are unremarkable. No acute abnormality of the trachea. Small amount of ingested material within the esophagus. Lungs/Pleura: Numerous pulmonary cysts are similar to prior. Mosaic attenuation within the lungs  likely related to imaging during exhalation though a possible air trapping or small airways disease could have a similar appearance. No consolidative opacity. No acute traumatic abnormality of the lung parenchyma. Basilar atelectatic changes are noted. No pneumothorax or effusion. Musculoskeletal: No chest wall abnormality. No acute or significant osseous findings. Prominent vascular channel in the left anterior articular facet of T9 is stable from prior. Review of the MIP images confirms the above findings. CT ABDOMEN and PELVIS FINDINGS Hepatobiliary: No focal hepatic injury or perihepatic hematoma. Diffuse hepatic hypoattenuation compatible with hepatic steatosis. No focal liver abnormality is seen. Patient is post cholecystectomy. Slight prominence of the biliary tree likely related to reservoir effect. No calcified intraductal gallstones. Pancreas: Unremarkable. No pancreatic ductal dilatation or surrounding inflammatory changes. Spleen: Normal arterial enhancement of the spleen. No splenic injury or perisplenic hematoma. Adrenals/Urinary Tract: No adrenal hematoma or suspicious adrenal lesions. Kidneys enhance and excrete symmetrically. No direct renal injury or perirenal hematoma. No suspicious renal lesions, urolithiasis or hydronephrosis. No acute bladder injury or other abnormality. Stomach/Bowel: Distal esophagus, stomach and duodenal sweep are unremarkable. No small bowel wall thickening or dilatation. No evidence of obstruction. A normal appendix is visualized. No colonic dilatation or wall thickening. Vascular/Lymphatic: The aorta is normal caliber. No suspicious or enlarged lymph nodes in the included lymphatic chains. Reproductive: Uterus is surgically absent. Retained ovaries. 2.9 cm cyst adjacent the left ovary. No other concerning adnexal lesion. Other: No  free fluid or free air. No traumatic abdominal injury. No bowel containing hernias. Musculoskeletal: Multilevel degenerative changes are  present in the imaged portions of the spine. Additional degenerative changes in the hips. No acute osseous abnormality or suspicious osseous lesion. Few stable bone islands in the left pelvis. Review of the MIP images confirms the above findings. IMPRESSION: 1. No acute traumatic injury of the chest, abdomen or pelvis. 2. Stable web-like filling defects in the branching of the left lower lobar artery, favored to represent chronic pulmonary emboli. 3. No acute pulmonary arterial filling defects are identified. No evidence of right heart strain. 4. Mosaic attenuation of the lungs may reflect imaging during exhalation or underlying air trapping/small airways disease. 5. 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 Consider nonemergent high-resolution chest CT for further evaluation. 6. Post hysterectomy with retained ovaries. 2.9 cm left adnexal cyst in the left ovary is within physiologic normal for a premenopausal female. 7. Hepatic steatosis. 8. Aortic Atherosclerosis (ICD10-I70.0) Electronically Signed   By: Lovena Le M.D.   On: 11/08/2019 23:42   Dg Pelvis Portable  Result Date: 11/08/2019 CLINICAL DATA:  Fall EXAM: PORTABLE PELVIS 1-2 VIEWS COMPARISON:  Radiograph 01/02/2008 FINDINGS: Appearance worrisome for a minimally impacted subcapital left femoral neck fracture. Femoral heads remain normally located. No other acute pelvic fracture or traumatic diastasis. Degenerative changes noted in the SI joints and both hips. Included portions of the lumbar spine are unremarkable aside from mild discogenic change. Bowel gas pattern is normal. Soft tissues are unremarkable. IMPRESSION: Findings worrisome for a minimally impacted subcapital left femoral neck fracture. Electronically Signed   By: Lovena Le M.D.   On: 11/08/2019 21:41   Ct L-spine No Charge  Result Date: 11/08/2019 CLINICAL DATA:  Fall EXAM: CT LUMBAR SPINE WITHOUT CONTRAST  TECHNIQUE: Multiplanar CT reconstructions of the lumbar spine were performed from concurrent CT of the chest, abdomen and pelvis. COMPARISON:  CT abdomen pelvis 10/20/2017, CT L-spine 02/03/2013 FINDINGS: Segmentation: 5 lumbar type vertebrae. Alignment: Preservation of the normal lumbar lordosis. Mild grade 1 anterolisthesis L5 on S1 is stable from comparison in 2014. Posterior elements are normally aligned without abnormal facet widening. Vertebrae: No acute vertebral body fracture, height loss other focal pathologic process. Posterior elements are intact. Included portions of the sacrum and pelvis are unremarkable aside from vacuum phenomenon in both SI joints and mild SI joint widening, similar to comparison. Paraspinal and other soft tissues: No paravertebral fluid or swelling. Mild posterior body wall edema. For findings in the abdomen and pelvis, please see dedicated CT from which this study is generated. Disc levels: Minimal posterior disc bulging at L4-5 and L5-S1 without significant canal stenosis. Posterior facet hypertrophic changes are most pronounced at the same levels with resulting mild bilateral foraminal narrowing at L4-5 and moderate bilateral neural foraminal narrowing at L5-S1. IMPRESSION: 1. No acute fracture or subluxation of the lumbar spine. 2. Mild grade 1 anterolisthesis L5 on S1 is stable from comparison in 2014. 3. Posterior facet hypertrophic changes at L4-5 and L5-S1 resulting in mild bilateral foraminal narrowing at L4-5 and moderate bilateral neural foraminal narrowing at L5-S1. 4. For findings in the abdomen and pelvis, please see dedicated CT from which this study is generated. Electronically Signed   By: Lovena Le M.D.   On: 11/08/2019 23:51   Dg Chest Portable 1 View  Result Date: 11/08/2019 CLINICAL DATA:  Fall, chest pain EXAM: PORTABLE CHEST 1 VIEW COMPARISON:  02/27/2018 FINDINGS: Heart  and mediastinal contours are within normal limits. No focal opacities or  effusions. No acute bony abnormality. IMPRESSION: No active disease. Electronically Signed   By: Rolm Baptise M.D.   On: 11/08/2019 21:40       Subjective: Did well overnight, no acute changes Did not want to go to snf   Discharge Exam: Vitals:   11/13/19 0408 11/13/19 0856  BP: 127/75 113/70  Pulse: 75 71  Resp: 16 18  Temp: (!) 97.3 F (36.3 C) 98.1 F (36.7 C)  SpO2: 100% 96%   Vitals:   11/12/19 0932 11/12/19 2139 11/13/19 0408 11/13/19 0856  BP: 115/77 100/68 127/75 113/70  Pulse: 77 78 75 71  Resp: 18 18 16 18   Temp: 97.6 F (36.4 C) 99 F (37.2 C) (!) 97.3 F (36.3 C) 98.1 F (36.7 C)  TempSrc: Oral Oral Oral Oral  SpO2: 99% 97% 100% 96%  Weight:  74.2 kg    Height:        General: Pt is alert, awake, not in acute distress Cardiovascular: RRR, S1/S2 +, no rubs, no gallops Respiratory: CTA bilaterally, no wheezing, no rhonchi Abdominal: Soft, NT, ND, bowel sounds + Extremities: no edema, no cyanosis, weakness on rle, question non-organic etiology    The results of significant diagnostics from this hospitalization (including imaging, microbiology, ancillary and laboratory) are listed below for reference.     Microbiology: Recent Results (from the past 240 hour(s))  SARS CORONAVIRUS 2 (TAT 6-24 HRS) Nasopharyngeal Nasopharyngeal Swab     Status: None   Collection Time: 11/09/19 12:06 AM   Specimen: Nasopharyngeal Swab  Result Value Ref Range Status   SARS Coronavirus 2 NEGATIVE NEGATIVE Final    Comment: (NOTE) SARS-CoV-2 target nucleic acids are NOT DETECTED. The SARS-CoV-2 RNA is generally detectable in upper and lower respiratory specimens during the acute phase of infection. Negative results do not preclude SARS-CoV-2 infection, do not rule out co-infections with other pathogens, and should not be used as the sole basis for treatment or other patient management decisions. Negative results must be combined with clinical observations, patient  history, and epidemiological information. The expected result is Negative. Fact Sheet for Patients: SugarRoll.be Fact Sheet for Healthcare Providers: https://www.woods-mathews.com/ This test is not yet approved or cleared by the [place] FDA and  has been authorized for detection and/or diagnosis of SARS-CoV-2 by FDA under an Emergency Use Authorization (EUA). This EUA will remain  in effect (meaning this test can be used) for the duration of the COVID-19 declaration under Section 56 4(b)(1) of the Act, 21 U.S.C. section 360bbb-3(b)(1), unless the authorization is terminated or revoked sooner. Performed at [facility], [place] [address] [place], [place] [address]      Labs: BNP (last 3 results) No results for input(s): BNP in the last 8760 hours. Basic Metabolic Panel: Recent Labs  Lab 11/09/19 0603 11/10/19 0454 11/11/19 0358 11/12/19 0622 11/13/19 0216  NA 135 136 138 137 136  K 3.9 3.5 3.7 3.5 3.6  CL 97* 98 103 99 100  CO2 28 29 25 28 29   GLUCOSE 290* 202* 185* 211* 255*  BUN 8 13 11 9 11   CREATININE 0.72 1.01* 0.82 0.75 0.94  CALCIUM 9.3 8.5* 8.3* 9.0 8.8*   Liver Function Tests: Recent Labs  Lab 11/08/19 2116  AST 36  ALT 44  ALKPHOS 64  BILITOT 0.5  PROT 8.2*  ALBUMIN 3.8   No results for input(s): LIPASE, AMYLASE in the last 168 hours. No  results for input(s): AMMONIA in the last 168 hours. CBC: Recent Labs  Lab 11/09/19 2326 11/10/19 0454 11/11/19 0358 11/12/19 0622 11/13/19 0216  WBC 10.7* 10.4 10.1 11.6* 12.0*  NEUTROABS 5.0 5.0 3.9 5.5 5.8  HGB 11.9* 11.9* 11.5* 12.0 12.0  HCT 36.7 37.2 36.4 37.4 37.1  MCV 79.8* 80.9 81.4 81.1 80.7  PLT 290 291 294 303 302   Cardiac Enzymes: No results for input(s): CKTOTAL, CKMB, CKMBINDEX, TROPONINI in the last 168 hours. BNP: Invalid input(s): POCBNP CBG: Recent Labs  Lab 11/12/19 1152 11/12/19 1612 11/12/19 2138 11/13/19 0643 11/13/19 1119   GLUCAP 201* 210* 234* 191* 252*   D-Dimer No results for input(s): DDIMER in the last 72 hours. Hgb A1c No results for input(s): HGBA1C in the last 72 hours. Lipid Profile No results for input(s): CHOL, HDL, LDLCALC, TRIG, CHOLHDL, LDLDIRECT in the last 72 hours. Thyroid function studies No results for input(s): TSH, T4TOTAL, T3FREE, THYROIDAB in the last 72 hours.  Invalid input(s): FREET3 Anemia work up No results for input(s): VITAMINB12, FOLATE, FERRITIN, TIBC, IRON, RETICCTPCT in the last 72 hours. Urinalysis    Component Value Date/Time   COLORURINE STRAW (A) 11/08/2019 2117   APPEARANCEUR CLEAR 11/08/2019 2117   LABSPEC 1.024 11/08/2019 2117   LABSPEC 1.015 12/26/2012 1422   PHURINE 8.0 11/08/2019 2117   GLUCOSEU >=500 (A) 11/08/2019 2117   GLUCOSEU > 1000 mg/dL (A) 07/04/2010 2040   HGBUR NEGATIVE 11/08/2019 2117   HGBUR moderate 07/04/2010 1357   BILIRUBINUR NEGATIVE 11/08/2019 2117   BILIRUBINUR Negative 12/26/2012 [place] 11/08/2019 2117   PROTEINUR NEGATIVE 11/08/2019 2117   UROBILINOGEN 0.2 09/05/2019 1044   NITRITE NEGATIVE 11/08/2019 2117   LEUKOCYTESUR NEGATIVE 11/08/2019 2117   LEUKOCYTESUR Negative 12/26/2012 1422   Sepsis Labs Invalid input(s): PROCALCITONIN,  WBC,  LACTICIDVEN Microbiology Recent Results (from the past 240 hour(s))  SARS CORONAVIRUS 2 (TAT 6-24 HRS) Nasopharyngeal Nasopharyngeal Swab     Status: None   Collection Time: 11/09/19 12:06 AM   Specimen: Nasopharyngeal Swab  Result Value Ref Range Status   SARS Coronavirus 2 NEGATIVE NEGATIVE Final    Comment: (NOTE) SARS-CoV-2 target nucleic acids are NOT DETECTED. The SARS-CoV-2 RNA is generally detectable in upper and lower respiratory specimens during the acute phase of infection. Negative results do not preclude SARS-CoV-2 infection, do not rule out co-infections with other pathogens, and should not be used as the sole basis for treatment or other patient  management decisions. Negative results must be combined with clinical observations, patient history, and epidemiological information. The expected result is Negative. Fact Sheet for Patients: SugarRoll.be Fact Sheet for Healthcare Providers: https://www.woods-mathews.com/ This test is not yet approved or cleared by the [place] FDA and  has been authorized for detection and/or diagnosis of SARS-CoV-2 by FDA under an Emergency Use Authorization (EUA). This EUA will remain  in effect (meaning this test can be used) for the duration of the COVID-19 declaration under Section 56 4(b)(1) of the Act, 21 U.S.C. section 360bbb-3(b)(1), unless the authorization is terminated or revoked sooner. Performed at [facility], [place] [address], [place], [place] [address]      Time coordinating discharge: Over 30 minutes  SIGNED:   Nicolette Bang, MD  [facility] 11/13/2019, 2:57 PM Pager   If 7PM-7AM, please contact night-coverage [email or web address] Password [email or web address]

## 2019-11-13 NOTE — Progress Notes (Addendum)
Occupational Therapy Treatment Patient Details Name: Makayla Frazier MRN: [id number] DOB: 08/13/1971 Today's Date: 11/13/2019    History of present illness 48 yo female with onset of syncopal episode was admitted, now has been given EEG with no seizure activity found.  MRI found C7-T1 disc protrusion with ventral cord pressure, has chronic PE's, unconfirmed L femoral neck fracture.  Referred to PT for weakness on R LE.  PMHx:  anterolisthesis L5-S1, hepatic steatosis, atherosclerosis, lumbar foraminal narrowing L4-S1    OT comments  Patient semi-supine in bed upon arrival. Patient reports feeling nauseous but agreeable to OT. RN provide medication during session. Patient require min/mod A and mod verbal + tactile cues for log roll technique. Patient requires increased time for all mobility is initially lethargic however as session progresses patient opens her eyes more and becomes more alert. Patient require verbal cues to pick up her feet to minimize risk of falls, patient shuffling when ambulating to sink. Patient require min cues for problem solving while performing oral care. Initially have patient sit back onto edge of bed, however patient reports she feels better with movement and would like to sit up in chair. Patient min A with ambulation for safety and min A with functional transfer with min cues for body mechanics.    Follow Up Recommendations  SNF;Supervision/Assistance - 24 hour;Other (comment)(if pt decline SNF HHOT with 24/7 supervision)    Equipment Recommendations  Other (comment)(to be determined at next venue of care)       Precautions / Restrictions Precautions Precautions: Fall;Back Precaution Comments: instructed body mechanics Restrictions Weight Bearing Restrictions: No       Mobility Bed Mobility Overal bed mobility: Needs Assistance Bed Mobility: Rolling;Sidelying to Sit Rolling: Min assist;Mod assist Sidelying to sit: Min assist;Mod assist;HOB elevated        General bed mobility comments: max cuing for log rolling technique and sequencing  Transfers Overall transfer level: Needs assistance Equipment used: Rolling walker (2 wheeled) Transfers: Sit to/from Stand Sit to Stand: Min assist         General transfer comment: mod verbal cues for sequencing and safety with body mechanics    Balance Overall balance assessment: Needs assistance Sitting-balance support: Feet supported Sitting balance-Leahy Scale: Good     Standing balance support: Bilateral upper extremity supported;During functional activity Standing balance-Leahy Scale: Poor Standing balance comment: reliant on external support for balance                           ADL either performed or assessed with clinical judgement   ADL Overall ADL's : Needs assistance/impaired Eating/Feeding: Independent;Sitting Eating/Feeding Details (indicate cue type and reason): drinking from cup Grooming: Wash/dry face;Oral care;Min guard;Standing Grooming Details (indicate cue type and reason): decreased problem solving to use bilateral upper extremities to manipulate grooming items, patient requires increased time for tasks                             Functional mobility during ADLs: Minimal assistance;Rolling walker General ADL Comments: patient shuffling her feet with ambulation require mod cues to correct               Cognition Arousal/Alertness: Lethargic Behavior During Therapy: Flat affect Overall Cognitive Status: No family/caregiver present to determine baseline cognitive functioning  General Comments: patient becomes more alert with furthered activity, decreased awareness and problem solving with self care tasks                   Pertinent Vitals/ Pain       Pain Assessment: Faces Faces Pain Scale: Hurts little more Pain Location: R side & RLE with movement Pain Descriptors / Indicators:  Grimacing;Aching;Sore Pain Intervention(s): Limited activity within patient's tolerance;Monitored during session         Frequency  Min 2X/week        Progress Toward Goals  OT Goals(current goals can now be found in the care plan section)  Progress towards OT goals: Progressing toward goals  Acute Rehab OT Goals Patient Stated Goal: to go home OT Goal Formulation: With patient Time For Goal Achievement: 11/24/19 Potential to Achieve Goals: Good ADL Goals Pt Will Perform Grooming: with modified independence;standing Pt Will Perform Upper Body Bathing: with modified independence;sitting Pt Will Perform Lower Body Bathing: sit to/from stand;sitting/lateral leans;with supervision Pt Will Perform Upper Body Dressing: with modified independence;sitting Pt Will Transfer to Toilet: with supervision;ambulating Additional ADL Goal #1: Pt will complete bed mobility min A to sit EOB for ADL/selfcare tasks  Plan Discharge plan updated       AM-PAC OT "6 Clicks" Daily Activity     Outcome Measure   Help from another person eating meals?: None Help from another person taking care of personal grooming?: A Little Help from another person toileting, which includes using toliet, bedpan, or urinal?: A Little Help from another person bathing (including washing, rinsing, drying)?: A Lot Help from another person to put on and taking off regular upper body clothing?: A Little Help from another person to put on and taking off regular lower body clothing?: A Lot 6 Click Score: 17    End of Session Equipment Utilized During Treatment: Rolling walker  OT Visit Diagnosis: Other abnormalities of gait and mobility (R26.89);Muscle weakness (generalized) (M62.81);Pain;Other symptoms and signs involving the nervous system (R29.898) Pain - Right/Left: Right Pain - part of body: Leg;Arm   Activity Tolerance Patient tolerated treatment well   Patient Left in chair;with chair alarm set;with call  bell/phone within reach;with nursing/sitter in room   Nurse Communication Mobility status        Time: [id number] OT Time Calculation (min): 38 min  Charges: OT General Charges $OT Visit: 1 Visit OT Treatments $Self Care/Home Management : 38-52 mins  Shon Millet OT OT office: [place] 11/13/2019, 1:25 PM

## 2019-11-13 NOTE — Evaluation (Signed)
Physical Therapy Evaluation Patient Details Name: Makayla Frazier MRN: [id number] DOB: 1971/04/10 Today's Date: 11/13/2019   History of Present Illness  48 yo female with onset of syncopal episode was admitted, now has been given EEG with no seizure activity found.  MRI found C7-T1 disc protrusion with ventral cord pressure, has chronic PE's, unconfirmed L femoral neck fracture.  Referred to PT for weakness on R LE.  PMHx:  anterolisthesis L5-S1, hepatic steatosis, atherosclerosis, lumbar foraminal narrowing L4-S1   Clinical Impression  Pt was seen for mobility on RW, two walks with assistance and instruction for body mechanics.  Pt is expecting to go home today and talked with her about not feeling she is ready for a rollator.  Pt is voicing understanding about this, waiting for nursing to release her from hosp.  Pt has not yet voided, and will expect she may still be in house tomorrow.  Follow up as needed acutely for progression of gait and to ensure pt is reducing fall risk with all therapy sessions.    Follow Up Recommendations Home health PT;Supervision for mobility/OOB    Equipment Recommendations  None recommended by PT    Recommendations for Other Services Rehab consult     Precautions / Restrictions Precautions Precautions: Fall;Back Precaution Comments: instructed body mechanics Restrictions Weight Bearing Restrictions: No      Mobility  Bed Mobility Overal bed mobility: Needs Assistance Bed Mobility: Rolling;Sidelying to Sit Rolling: Min assist;Mod assist Sidelying to sit: Min assist;Mod assist;HOB elevated       General bed mobility comments: in chair when PT arrived  Transfers Overall transfer level: Needs assistance Equipment used: Rolling walker (2 wheeled) Transfers: Sit to/from Stand Sit to Stand: Min guard         General transfer comment: min guard with prompts for hand placement  Ambulation/Gait Ambulation/Gait assistance: Min guard Gait  Distance (Feet): 40 Feet(20 x 2) Assistive device: Rolling walker (2 wheeled) Gait Pattern/deviations: Step-through pattern;Decreased stride length;Wide base of support Gait velocity: reduced   General Gait Details: more standing balance control and better effort to stand from chair  Stairs            Wheelchair Mobility    Modified Rankin (Stroke Patients Only)       Balance Overall balance assessment: Needs assistance Sitting-balance support: Feet supported Sitting balance-Leahy Scale: Good     Standing balance support: Bilateral upper extremity supported;During functional activity Standing balance-Leahy Scale: Fair Standing balance comment: fair statically, requires RW dynamically                             Pertinent Vitals/Pain Pain Assessment: Faces Faces Pain Scale: Hurts little more Pain Location: R side & RLE with movement Pain Descriptors / Indicators: Aching;Grimacing Pain Intervention(s): Limited activity within patient's tolerance;Monitored during session;Repositioned    Home Living                        Prior Function                 Hand Dominance        Extremity/Trunk Assessment                Communication      Cognition Arousal/Alertness: Awake/alert Behavior During Therapy: Flat affect Overall Cognitive Status: No family/caregiver present to determine baseline cognitive functioning  General Comments: much more interactive today, talking about leaving for home      General Comments General comments (skin integrity, edema, etc.): pt is up in chair with good attitude and great plan to go home, but asking about the rollator.  Talked with her about lack of safety     Exercises     Assessment/Plan    PT Assessment    PT Problem List         PT Treatment Interventions      PT Goals (Current goals can be found in the Care Plan section)  Acute Rehab  PT Goals Patient Stated Goal: to go home    Frequency Min 3X/week   Barriers to discharge        Co-evaluation               AM-PAC PT "6 Clicks" Mobility  Outcome Measure Help needed turning from your back to your side while in a flat bed without using bedrails?: A Little Help needed moving from lying on your back to sitting on the side of a flat bed without using bedrails?: A Little Help needed moving to and from a bed to a chair (including a wheelchair)?: A Little Help needed standing up from a chair using your arms (e.g., wheelchair or bedside chair)?: A Little Help needed to walk in hospital room?: A Little Help needed climbing 3-5 steps with a railing? : A Lot 6 Click Score: 17    End of Session   Activity Tolerance: Patient limited by lethargy;Patient limited by fatigue Patient left: in bed;with call bell/phone within reach;with bed alarm set Nurse Communication: Mobility status PT Visit Diagnosis: Unsteadiness on feet (R26.81);Muscle weakness (generalized) (M62.81);History of falling (Z91.81);Pain Pain - Right/Left: Right Pain - part of body: Leg;Knee    Time: [id number] PT Time Calculation (min) (ACUTE ONLY): 27 min   Charges:     PT Treatments $Gait Training: 8-22 mins $Therapeutic Activity: 8-22 mins       Ramond Dial 11/13/2019, 3:09 PM   Mee Hives, PT MS Acute Rehab Dept. Number: [place] and [place]

## 2019-11-13 NOTE — TOC Transition Note (Signed)
Transition of Care [facility]) - CM/SW Discharge Note   Patient Details  Name: Makayla Frazier MRN: [id number] Date of Birth: 1971/10/02  [facility] CM/SW Contact:  Bartholomew Crews, RN Phone Number: [phone] 11/13/2019, 3:18 PM   Clinical Narrative:    Spoke with patient at bedside.   Discussed getting call from home care agency ([facility] vs [facility] vs [facility]).   Discussed Alvis Lemmings calling to schedule visits for physical and occupational therapy sessions.   [facility] to provide RW and WC.  All DME to be delivered to bedside.   No other transition of care needs identified at this time.    Final next level of care: [place] Barriers to Discharge: No Barriers Identified   Patient Goals and CMS Choice Patient states their goals for this hospitalization and ongoing recovery are:: wants to go home CMS Medicare.gov Compare Post Acute Care list provided to:: Patient Choice offered to / list presented to : Patient  Discharge Placement                       Discharge Plan and Services In-house Referral: Clinical Social Work Discharge Planning Services: CM Consult Post Acute Care Choice: Durable Medical Equipment, Home Health          DME Arranged: Walker rolling, Wheelchair manual DME Agency: [facility] Date DME Agency Contacted: 11/13/19 Time DME Agency Contacted: [phone] Representative spoke with at DME Agency: Thedore Mins HH Arranged: PT, OT [place] Agency: [place] Date [place]: 11/13/19 Time [place]: 1518 Representative spoke with at [place]: [place] ([place]) Interventions     Readmission Risk Interventions No flowsheet data found.

## 2019-11-19 ENCOUNTER — Encounter: Payer: Self-pay | Admitting: Neurology

## 2019-11-20 ENCOUNTER — Other Ambulatory Visit: Payer: Self-pay

## 2019-11-20 ENCOUNTER — Telehealth (INDEPENDENT_AMBULATORY_CARE_PROVIDER_SITE_OTHER): Payer: Medicare Other | Admitting: Neurology

## 2019-11-20 VITALS — Ht 60.0 in | Wt 163.0 lb

## 2019-11-20 DIAGNOSIS — G40009 Localization-related (focal) (partial) idiopathic epilepsy and epileptic syndromes with seizures of localized onset, not intractable, without status epilepticus: Secondary | ICD-10-CM | POA: Diagnosis not present

## 2019-11-20 NOTE — Progress Notes (Signed)
Virtual Visit via Video Note The purpose of this virtual visit is to provide medical care while limiting exposure to the novel coronavirus.    Consent was obtained for video visit:  Yes.   Answered questions that patient had about telehealth interaction:  Yes.   I discussed the limitations, risks, security and privacy concerns of performing an evaluation and management service by telemedicine. I also discussed with the patient that there may be a patient responsible charge related to this service. The patient expressed understanding and agreed to proceed.  Pt location: Home Physician Location: office Name of referring provider:  Scifres, Dorothy, PA-C I connected with Makayla Frazier at patients initiation/request on 11/20/2019 at  9:00 AM EST by video enabled telemedicine application and verified that I am speaking with the correct person using two identifiers. Pt MRN:  [id number] Pt DOB:  [birth year] Video Participants:  Makayla Frazier   History of Present Illness:  This is a 48 year old right-handed woman with a history of hypertension, hyperlipidemia, thyroid disease, depression, PE on Xarelto, presenting for evaluation of seizures. Records from her prior neurologist were reviewed, she was last seen at [facility] in 2016. Per records she started having seizures in 2009, although she recalled passing out spells when younger. Seizures were described as feeling hot then passing at, at times with shaking. She has a bad headache after. She had a cardiology evaluation with a loop recorder reportedly normal. She was started on Topiramate at one point, however with abrupt increase from 25mg  BID to 100mg  BID, she had significant cognitive side effects. She has been on Topiramate 25mg  at bedtime for several years for migraine prophylaxis. She reports her last seizure was in December 2015 when she had tremors in her sleep (not convulsions). There is a normal EEG report from 2015. She continued  to report nocturnal events and was started on Levetiracetam 500mg  BID. Since starting Levetiracetam, the nocturnal shaking episodes decreased, and she did not have any daytime episodes until she had an episode of shaking with incontinence 7 months ago, then she was brought to [facility] after an episode of loss of consciousness on 11/08/2019. She recalls feeling a pull on her chest, then woke up on the ground. No tongue bite or incontinence, her husband did not witness any shaking. She was a little out of it after with a slight headache, her right arm and leg were weaker and numb, she was dragging her leg like it was deadweight. It took 1-2 days to feel normal. I personally reviewed MRI brain without contrast which did not show any acute changes, hippocampi symmetric, partially empty sella. Her wake and sleep EEG was within normal limits. She reported that she had cut down Levetiracetam to 500mg  at bedtime a year ago due to drowsiness and nausea, however since hospital discharge she is back to taking 500mg  BID with nausea medication. Her husband has mentioned episodes where she would stare off or get a little confused, none recently. She has occasional mild tremors in both hands. She denies any olfactory/gustatory hallucinations. She reports similar transient right-sided weakness after a spell last year. She has been doing PT and was told by PT yesterday to use a walker. She denies any neck/back pain.  She has occasional migraines with good response to low dose Topiramate 25mg  at bedtime. When off medications, she gets very photosensitive. Last migraine was last summer. She takes Maxalt prn. She has been on Pregabalin 100mg  TID for neuropathy over the  past year, which helps with numbness/tingling in both feet. In the hospital, she had urinary retention, MRI thoracic and lumbar spine were unremarkable, urinary retention has resolved. She has a lot of constipation. She reports a sleep study where she was told she "stopped  breathing 10x/hr but did not start CPAP." Her last sleep study in 2015 reportedly showed OSA. She recalls having a prolonged EEG in the past but with no results due to technical difficulties.   Epilepsy Risk Factors:  Maternal grandfather and some cousins had seizures. Otherwise she had a normal birth and early development.  There is no history of febrile convulsions, CNS infections such as meningitis/encephalitis, significant traumatic brain injury, neurosurgical procedures.   PAST MEDICAL HISTORY: Past Medical History:  Diagnosis Date   Anemia    Arthritis    Left leg   Asthma    BV (bacterial vaginosis) 0000000   Complication of anesthesia    Seizures in PACU after surgery 3/15   DM (diabetes mellitus) ([place])    diagnosed 2009   DVT (deep venous thrombosis) ([place]) 2011   pt had IUD; also 05/2012   Dysfunctional uterine bleeding    Seen by Dr. Leo Frazier, GYN; pending hysterectomy as of 07/2012   H/O hypercoagulable state    2/2 contraception   Headache(784.0)    migraines   Herpes simplex without mention of complication 0000000   HSV-2   HLD (hyperlipidemia)    Hypertension    Infection    UTI   Labial lesion 2013   Major depression    Hx suicide attempt in 2009, [facility] admissions   Mastodynia    Neuromuscular disorder ([place])    neuropathy   Neuropathy    Obesity    Oligomenorrhea 2011   PE (pulmonary embolism)    2009 - on oral contraception; multiple   Post - coital bleeding 2012   Seizures ([place]) 2015   last episode May 2015   Sleep apnea    SORE THROAT 08/10/2010   Qualifier: Diagnosis of  By: Vanessa Kick MD, Makayla Frazier     Status post placement of implantable loop recorder    Stroke [facility])    TIA 2013   Syncopal episodes    unknown etiology   Thyroid disease    hypothyroidism (pt denies)   Vaginal Pap smear, abnormal    Venous insufficiency    Vitamin D deficiency    unknown to pt.    PAST SURGICAL HISTORY: Past Surgical History:    Procedure Laterality Date   BILATERAL SALPINGECTOMY  12/18/2012   Procedure: BILATERAL SALPINGECTOMY;  Surgeon: Eldred Manges, MD;  Location: [facility];  Service: Gynecology;  Laterality: Bilateral;   BLADDER SUSPENSION N/A 02/13/2014   Procedure: TRANSVAGINAL TAPE (TVT) PROCEDURE;  Surgeon: Delice Lesch, MD;  Location: [facility];  Service: Gynecology;  Laterality: N/A;   BREAST REDUCTION SURGERY     Dr. Eugene Garnet Yeager DFT     Implantable Loop recorder; no arrhythmias associated with synocpal spells; loop explanted 07-2013   CHOLECYSTECTOMY     DILATATION & CURRETTAGE/HYSTEROSCOPY WITH RESECTOCOPE  2007 & 2013   ENDOMETRIAL BIOPSY  2011   IUD REMOVAL  12/18/2012   Procedure: INTRAUTERINE DEVICE (IUD) REMOVAL;  Surgeon: Eldred Manges, MD;  Location: [facility];  Service: Gynecology;  Laterality: N/A;  Removed during prep by E. Powell PA   LAPAROSCOPIC ASSISTED VAGINAL HYSTERECTOMY  12/18/2012   Procedure: LAPAROSCOPIC ASSISTED VAGINAL HYSTERECTOMY;  Surgeon: Eldred Manges,  MD;  Location: [facility];  Service: Gynecology;  Laterality: N/A;   LOOP RECORDER EXPLANT N/A 07/17/2013   Procedure: LOOP RECORDER EXPLANT;  Surgeon: Thompson Grayer, MD;  Location: [facility] C[facility];  Service: Cardiovascular;  Laterality: N/A;   TRANSVAGINAL TAPE (TVT) REMOVAL N/A 04/28/2014   Procedure: Removal of suburethral mesh;  Surgeon: Delice Lesch, MD;  Location: [facility];  Service: Gynecology;  Laterality: N/A;   WISDOM TOOTH EXTRACTION      MEDICATIONS: Current Outpatient Medications on File Prior to Visit  Medication Sig Dispense Refill   acetaminophen (TYLENOL) 500 MG tablet Take 1,000 mg by mouth every 6 (six) hours as needed for moderate pain.      albuterol (PROVENTIL HFA;VENTOLIN HFA) 108 (90 BASE) MCG/ACT inhaler Inhale 2 puffs into the lungs every 6 (six) hours as needed for wheezing or shortness of breath.     amLODipine (NORVASC) 5 MG tablet Take 2 tablets  (10 mg total) by mouth daily. (Patient taking differently: Take 10 mg by mouth at bedtime. ) 90 tablet 3   atorvastatin (LIPITOR) 10 MG tablet Take 10 mg by mouth at bedtime.     cetirizine (ZYRTEC) 10 MG tablet Take 1 tablet (10 mg total) by mouth daily. (Patient taking differently: Take 10 mg by mouth at bedtime. ) 90 tablet 3   chlorthalidone (HYGROTON) 25 MG tablet TAKE 2 TABLETS(50 MG) BY MOUTH DAILY (Patient taking differently: Take 50 mg by mouth at bedtime. ) 180 tablet 0   EPINEPHrine 0.3 mg/0.3 mL IJ SOAJ injection Inject 0.3 mg into the muscle as needed for anaphylaxis.      escitalopram (LEXAPRO) 10 MG tablet Take 10 mg by mouth at bedtime.      fluticasone (FLONASE) 50 MCG/ACT nasal spray Place 1 spray into both nostrils daily as needed (for nasal congestion/sinus infection).      levETIRAcetam (KEPPRA) 500 MG tablet Take 1 tablet (500 mg total) by mouth 2 (two) times daily. 60 tablet 0   lisinopril (ZESTRIL) 5 MG tablet Take 2.5 mg by mouth daily.     montelukast (SINGULAIR) 10 MG tablet Take 10 mg by mouth at bedtime. Reported on 02/16/2016     Multiple Vitamins-Minerals (CENTRUM WOMEN PO) Take by mouth daily.     ondansetron (ZOFRAN) 4 MG tablet Take 4 mg by mouth as needed for nausea/vomiting.     pregabalin (LYRICA) 100 MG capsule TAKE 1 CAPSULE(100 MG) BY MOUTH THREE TIMES DAILY (Patient taking differently: Take 100 mg by mouth 3 (three) times daily. ) 90 capsule 0   rivaroxaban (XARELTO) 20 MG TABS tablet Take 20 mg by mouth at bedtime.      rizatriptan (MAXALT-MLT) 5 MG disintegrating tablet Take 1 tablet (5 mg total) by mouth as needed for migraine. May repeat in 2 hours if needed 15 tablet 12   SYMBICORT 160-4.5 MCG/ACT inhaler Inhale 2 puffs into the lungs 2 (two) times daily.     topiramate (TOPAMAX) 25 MG tablet Take 25 mg by mouth at bedtime.      TRULICITY 1.5 0000000 SOPN Inject 1.5 mg into the skin once a week. Fridays     No current  facility-administered medications on file prior to visit.    ALLERGIES: Allergies  Allergen Reactions   Silver Other (See Comments) and Rash   Codeine Nausea And Vomiting    Pt takes Percocet without problems    Darvocet [Propoxyphene N-Acetaminophen] Nausea And Vomiting   Hydrocodone-Acetaminophen Nausea And Vomiting   Hydrocodone-Acetaminophen Nausea And  Vomiting    Vomiting   Latex Rash   Tape Itching and Rash   Tramadol Nausea Only     Current Outpatient Medications on File Prior to Visit  Medication Sig Dispense Refill   acetaminophen (TYLENOL) 500 MG tablet Take 1,000 mg by mouth every 6 (six) hours as needed for moderate pain.      albuterol (PROVENTIL HFA;VENTOLIN HFA) 108 (90 BASE) MCG/ACT inhaler Inhale 2 puffs into the lungs every 6 (six) hours as needed for wheezing or shortness of breath.     amLODipine (NORVASC) 5 MG tablet Take 2 tablets (10 mg total) by mouth daily. (Patient taking differently: Take 10 mg by mouth at bedtime. ) 90 tablet 3   atorvastatin (LIPITOR) 10 MG tablet Take 10 mg by mouth at bedtime.     cetirizine (ZYRTEC) 10 MG tablet Take 1 tablet (10 mg total) by mouth daily. (Patient taking differently: Take 10 mg by mouth at bedtime. ) 90 tablet 3   chlorthalidone (HYGROTON) 25 MG tablet TAKE 2 TABLETS(50 MG) BY MOUTH DAILY (Patient taking differently: Take 50 mg by mouth at bedtime. ) 180 tablet 0   EPINEPHrine 0.3 mg/0.3 mL IJ SOAJ injection Inject 0.3 mg into the muscle as needed for anaphylaxis.      escitalopram (LEXAPRO) 10 MG tablet Take 10 mg by mouth at bedtime.      fluticasone (FLONASE) 50 MCG/ACT nasal spray Place 1 spray into both nostrils daily as needed (for nasal congestion/sinus infection).      levETIRAcetam (KEPPRA) 500 MG tablet Take 1 tablet (500 mg total) by mouth 2 (two) times daily. 60 tablet 0   lisinopril (ZESTRIL) 5 MG tablet Take 2.5 mg by mouth daily.     montelukast (SINGULAIR) 10 MG tablet Take 10 mg by  mouth at bedtime. Reported on 02/16/2016     Multiple Vitamins-Minerals (CENTRUM WOMEN PO) Take by mouth daily.     ondansetron (ZOFRAN) 4 MG tablet Take 4 mg by mouth as needed for nausea/vomiting.     pregabalin (LYRICA) 100 MG capsule TAKE 1 CAPSULE(100 MG) BY MOUTH THREE TIMES DAILY (Patient taking differently: Take 100 mg by mouth 3 (three) times daily. ) 90 capsule 0   rivaroxaban (XARELTO) 20 MG TABS tablet Take 20 mg by mouth at bedtime.      rizatriptan (MAXALT-MLT) 5 MG disintegrating tablet Take 1 tablet (5 mg total) by mouth as needed for migraine. May repeat in 2 hours if needed 15 tablet 12   SYMBICORT 160-4.5 MCG/ACT inhaler Inhale 2 puffs into the lungs 2 (two) times daily.     topiramate (TOPAMAX) 25 MG tablet Take 25 mg by mouth at bedtime.      TRULICITY 1.5 0000000 SOPN Inject 1.5 mg into the skin once a week. Fridays     No current facility-administered medications on file prior to visit.     Observations/Objective:   Vitals:   11/19/19 1611  BP: 110/70  Pulse: 85  Weight: 163 lb (73.9 kg)  Height: 5' (1.524 m)   GEN:  The patient appears stated age and is in NAD.  Neurological examination: Patient is awake, alert, oriented x 3. No aphasia or dysarthria. Intact fluency and comprehension. Remote and recent memory intact. Able to name and repeat. Cranial nerves: Extraocular movements intact with no nystagmus. No facial asymmetry. Motor: moves all extremities symmetrically, at least anti-gravity x 4. No incoordination on finger to nose testing. Gait: slow and cautious favoring right leg.   Assessment and  Plan:   This is a 48 year old right-handed woman with a history of hypertension, hyperlipidemia, thyroid disease, depression, PE on Xarelto, presenting for evaluation of seizures. She reports a history of nocturnal shaking episodes, as well as episodes of loss of consciousness with post-event right-sided weakness suggestive of focal to bilateral tonic-clonic  seizures arising from the left hemisphere. MRI brain and routine EEG unremarkable. She had been doing well until the past year when she self-reduced Levetiracetam. She is back on Levetiracetam 500mg  BID. She is also on Topiramate 25mg  at bedtime for migraines, and Pregabalin 100mg  TID for neuropathy. If episodes continue on current medications, we will plan for prolonged EEG for classification. [place] driving laws were discussed with the patient, and she knows to stop driving after a seizure, until 6 months seizure-free. Follow-up in 3 months, she knows to call for any changes.    Follow Up Instructions:   -I discussed the assessment and treatment plan with the patient. The patient was provided an opportunity to ask questions and all were answered. The patient agreed with the plan and demonstrated an understanding of the instructions.   The patient was advised to call back or seek an in-person evaluation if the symptoms worsen or if the condition fails to improve as anticipated.    Cameron Sprang, MD

## 2019-11-21 ENCOUNTER — Telehealth: Payer: Self-pay | Admitting: Neurology

## 2019-11-21 NOTE — Telephone Encounter (Signed)
Patient called regarding her having a virtual Visit on 11/20/19. She said that she had a Catheter taken out and she feels Pressure and not urinating like she should. Please Call. Thank you

## 2019-11-21 NOTE — Telephone Encounter (Signed)
Pt informed that Dr. Delice Lesch is unable to advise treatment for urinary symptoms. Instructed pt to contact her PCP or GYN. Pt understood.

## 2019-12-01 MED ORDER — LEVETIRACETAM 500 MG PO TABS: 500.0000 mg | ORAL_TABLET | Freq: Two times a day (BID) | ORAL | 3 refills | Status: DC

## 2019-12-09 ENCOUNTER — Ambulatory Visit: Payer: Medicare Other | Admitting: Internal Medicine

## 2019-12-23 ENCOUNTER — Other Ambulatory Visit: Payer: Self-pay

## 2019-12-25 ENCOUNTER — Ambulatory Visit (INDEPENDENT_AMBULATORY_CARE_PROVIDER_SITE_OTHER): Payer: Medicare Other | Admitting: Internal Medicine

## 2019-12-25 ENCOUNTER — Encounter: Payer: Self-pay | Admitting: Internal Medicine

## 2019-12-25 VITALS — BP 172/102 | HR 95 | Temp 97.8°F | Ht 60.0 in | Wt 164.0 lb

## 2019-12-25 DIAGNOSIS — Z794 Long term (current) use of insulin: Secondary | ICD-10-CM | POA: Diagnosis not present

## 2019-12-25 DIAGNOSIS — E1140 Type 2 diabetes mellitus with diabetic neuropathy, unspecified: Secondary | ICD-10-CM | POA: Diagnosis not present

## 2019-12-25 DIAGNOSIS — E1165 Type 2 diabetes mellitus with hyperglycemia: Secondary | ICD-10-CM | POA: Diagnosis not present

## 2019-12-25 LAB — GLUCOSE, POCT (MANUAL RESULT ENTRY): POC Glucose: 279 mg/dl — AB (ref 70–99)

## 2019-12-25 MED ORDER — GLIPIZIDE 5 MG PO TABS: 5.0000 mg | ORAL_TABLET | Freq: Two times a day (BID) | ORAL | 3 refills | Status: DC

## 2019-12-25 NOTE — Patient Instructions (Addendum)
-   Start Glipizide 5 mg, 1 tablet before Breakfast and Before Supper  - Continue trulicity 1.5 mg weekly   Choose healthy, lower carb lower calorie snacks: toss salad, cooked vegetables, cottage cheese, peanut butter, low fat cheese / string cheese, lower sodium deli meat, tuna salad or chicken salad     HOW TO TREAT LOW BLOOD SUGARS (Blood sugar LESS THAN 70 MG/DL)  Please follow the RULE OF 15 for the treatment of hypoglycemia treatment (when your (blood sugars are less than 70 mg/dL)    STEP 1: Take 15 grams of carbohydrates when your blood sugar is low, which includes:   3-4 GLUCOSE TABS  OR  3-4 OZ OF JUICE OR REGULAR SODA OR  ONE TUBE OF GLUCOSE GEL     STEP 2: RECHECK blood sugar in 15 MINUTES STEP 3: If your blood sugar is still low at the 15 minute recheck --> then, go back to STEP 1 and treat AGAIN with another 15 grams of carbohydrates.

## 2019-12-25 NOTE — Progress Notes (Signed)
Name: Makayla Frazier  MRN/ DOB: [id number], [birth year]   Age/ Sex: 49 y.o., female    PCP: Makayla Frazier   Reason for Endocrinology Evaluation: Type 2 Diabetes Mellitus     Date of Initial Endocrinology Visit: 12/25/2019     PATIENT IDENTIFIER: Makayla Frazier is a 49 y.o. female with a past medical history of T2DM, HTN and seizure disorder. The patient presented for initial endocrinology clinic visit on 12/25/2019 for consultative assistance with her diabetes management.    HPI: Makayla Frazier was    Diagnosed with DM 2009 Prior Medications tried/Intolerance: Was initially on glipizide and Metformin- diarrhea . Victoza - rash . Insulin . Invokana - genital infection  Currently checking blood sugars 0 x / day, Hypoglycemia episodes :no   Hemoglobin A1c has ranged from 7.0% in 2011, peaking at 11.3% in 3013. Patient required assistance for hypoglycemia: yes- years ago Patient has required hospitalization within the last 1 year from hyper or hypoglycemia: no   In terms of diet, the patient eats 2-3 meals and snack between meals, does not drink any sugar-sweetened beverages   Was followed by Dr. Hartford Poli through [facility]: Trulicity 1.5 mg weekly (Sunday)    Statin: Yes ACE-I/ARB: Yes Prior Diabetic Education: yes    METER DOWNLOAD SUMMARY: Did not bring       DIABETIC COMPLICATIONS: Microvascular complications:   Neuropathy  Denies: CKD , retinopathy  Last eye exam: Completed 2020  Macrovascular complications:   TIA  Denies: CAD, PVD, CVA   PAST HISTORY: Past Medical History:  Past Medical History:  Diagnosis Date  . Anemia   . Arthritis    Left leg  . Asthma   . BV (bacterial vaginosis) 2012  . Complication of anesthesia    Seizures in PACU after surgery 3/15  . DM (diabetes mellitus) ([place])    diagnosed 2009  . DVT (deep venous thrombosis) ([place]) 2011   pt had IUD; also 05/2012  . Dysfunctional uterine bleeding     Seen by Dr. Leo Grosser, GYN; pending hysterectomy as of 07/2012  . H/O hypercoagulable state    2/2 contraception  . Headache(784.0)    migraines  . Herpes simplex without mention of complication 0000000   HSV-2  . HLD (hyperlipidemia)   . Hypertension   . Infection    UTI  . Labial lesion 2013  . Major depression    Hx suicide attempt in 2009, [facility] admissions  . Mastodynia   . Neuromuscular disorder (HCC)    neuropathy  . Neuropathy   . Obesity   . Oligomenorrhea 2011  . PE (pulmonary embolism)    2009 - on oral contraception; multiple  . Post - coital bleeding 2012  . Seizures ([place]) 2015   last episode May 2015  . Sleep apnea   . SORE THROAT 08/10/2010   Qualifier: Diagnosis of  By: Vanessa Kick MD, Saralyn Pilar    . Status post placement of implantable loop recorder   . Stroke [facility])    TIA 2013  . Syncopal episodes    unknown etiology  . Thyroid disease    hypothyroidism (pt denies)  . Vaginal Pap smear, abnormal   . Venous insufficiency   . Vitamin D deficiency    unknown to pt.   Past Surgical History:  Past Surgical History:  Procedure Laterality Date  . BILATERAL SALPINGECTOMY  12/18/2012   Procedure: BILATERAL SALPINGECTOMY;  Surgeon: Eldred Manges, MD;  Location: [facility];  Service: Gynecology;  Laterality: Bilateral;  . BLADDER SUSPENSION N/A 02/13/2014   Procedure: TRANSVAGINAL TAPE (TVT) PROCEDURE;  Surgeon: Delice Lesch, MD;  Location: [facility];  Service: Gynecology;  Laterality: N/A;  . BREAST REDUCTION SURGERY     Dr. Eugene Garnet [facility] 2011  . CARDIAC ELECTROPHYSIOLOGY STUDY & DFT     Implantable Loop recorder; no arrhythmias associated with synocpal spells; loop explanted 07-2013  . CHOLECYSTECTOMY    . DILATATION & CURRETTAGE/HYSTEROSCOPY WITH RESECTOCOPE  2007 & 2013  . ENDOMETRIAL BIOPSY  2011  . IUD REMOVAL  12/18/2012   Procedure: INTRAUTERINE DEVICE (IUD) REMOVAL;  Surgeon: Eldred Manges, MD;  Location: [facility];  Service: Gynecology;  Laterality: N/A;   Removed during prep by E. Florene Glen PA  . LAPAROSCOPIC ASSISTED VAGINAL HYSTERECTOMY  12/18/2012   Procedure: LAPAROSCOPIC ASSISTED VAGINAL HYSTERECTOMY;  Surgeon: Eldred Manges, MD;  Location: [facility];  Service: Gynecology;  Laterality: N/A;  . LOOP RECORDER EXPLANT N/A 07/17/2013   Procedure: LOOP RECORDER EXPLANT;  Surgeon: Thompson Grayer, MD;  Location: [facility];  Service: Cardiovascular;  Laterality: N/A;  . TRANSVAGINAL TAPE (TVT) REMOVAL N/A 04/28/2014   Procedure: Removal of suburethral mesh;  Surgeon: Delice Lesch, MD;  Location: [facility];  Service: Gynecology;  Laterality: N/A;  . WISDOM TOOTH EXTRACTION        Social History:  reports that she has never smoked. She has never used smokeless tobacco. She reports that she does not drink alcohol or use drugs. Family History:  Family History  Problem Relation Age of Onset  . Melanoma Father 62  . Diabetes Father   . Hypertension Father   . Kidney disease Father   . Ulcerative colitis Mother 54  . Diabetes Mother   . Hypertension Mother   . Breast cancer Paternal Grandmother   . Cancer Paternal Grandmother   . Diabetes type II Maternal Grandmother        deceased 21  . Diabetes Maternal Grandmother   . Pulmonary embolism Paternal Grandfather   . Stroke Maternal Grandfather      HOME MEDICATIONS: Allergies as of 12/25/2019      Reactions   Silver Other (See Comments), Rash   Codeine Nausea And Vomiting   Pt takes Percocet without problems   Darvocet [propoxyphene N-acetaminophen] Nausea And Vomiting   Hydrocodone-acetaminophen Nausea And Vomiting   Hydrocodone-acetaminophen Nausea And Vomiting   Vomiting   Latex Rash   Tape Itching, Rash   Tramadol Nausea Only      Medication List       Accurate as of December 25, 2019 12:31 PM. If you have any questions, ask your nurse or doctor.        acetaminophen 500 MG tablet Commonly known as: TYLENOL Take 1,000 mg by mouth every 6 (six) hours as needed for moderate  pain.   albuterol 108 (90 Base) MCG/ACT inhaler Commonly known as: VENTOLIN HFA Inhale 2 puffs into the lungs every 6 (six) hours as needed for wheezing or shortness of breath.   amLODipine 5 MG tablet Commonly known as: NORVASC Take 2 tablets (10 mg total) by mouth daily. What changed: when to take this   atorvastatin 10 MG tablet Commonly known as: LIPITOR Take 10 mg by mouth at bedtime.   CENTRUM WOMEN PO Take by mouth daily.   cetirizine 10 MG tablet Commonly known as: ZYRTEC Take 1 tablet (10 mg total) by mouth daily. What changed: when to take this   chlorthalidone 25  MG tablet Commonly known as: HYGROTON TAKE 2 TABLETS(50 MG) BY MOUTH DAILY What changed: See the new instructions.   EPINEPHrine 0.3 mg/0.3 mL Soaj injection Commonly known as: EPI-PEN Inject 0.3 mg into the muscle as needed for anaphylaxis.   escitalopram 10 MG tablet Commonly known as: LEXAPRO Take 10 mg by mouth at bedtime.   fluticasone 50 MCG/ACT nasal spray Commonly known as: FLONASE Place 1 spray into both nostrils daily as needed (for nasal congestion/sinus infection).   glipiZIDE 5 MG tablet Commonly known as: GLUCOTROL Take 1 tablet (5 mg total) by mouth 2 (two) times daily before a meal. Started by: Dorita Sciara, MD   levETIRAcetam 500 MG tablet Commonly known as: KEPPRA Take 1 tablet (500 mg total) by mouth 2 (two) times daily.   lisinopril 5 MG tablet Commonly known as: ZESTRIL Take 2.5 mg by mouth daily.   montelukast 10 MG tablet Commonly known as: SINGULAIR Take 10 mg by mouth at bedtime. Reported on 02/16/2016   ondansetron 4 MG tablet Commonly known as: ZOFRAN Take 4 mg by mouth as needed for nausea/vomiting.   pregabalin 100 MG capsule Commonly known as: LYRICA TAKE 1 CAPSULE(100 MG) BY MOUTH THREE TIMES DAILY What changed: See the new instructions.   rizatriptan 5 MG disintegrating tablet Commonly known as: Maxalt-MLT Take 1 tablet (5 mg total) by mouth  as needed for migraine. May repeat in 2 hours if needed   Symbicort 160-4.5 MCG/ACT inhaler Generic drug: budesonide-formoterol Inhale 2 puffs into the lungs 2 (two) times daily.   topiramate 25 MG tablet Commonly known as: TOPAMAX Take 25 mg by mouth at bedtime.   Trulicity 1.5 0000000 Sopn Generic drug: Dulaglutide Inject 1.5 mg into the skin once a week. Fridays   Xarelto 20 MG Tabs tablet Generic drug: rivaroxaban Take 20 mg by mouth at bedtime.        ALLERGIES: Allergies  Allergen Reactions  . Silver Other (See Comments) and Rash  . Codeine Nausea And Vomiting    Pt takes Percocet without problems   . Darvocet [Propoxyphene N-Acetaminophen] Nausea And Vomiting  . Hydrocodone-Acetaminophen Nausea And Vomiting  . Hydrocodone-Acetaminophen Nausea And Vomiting    Vomiting  . Latex Rash  . Tape Itching and Rash  . Tramadol Nausea Only     REVIEW OF SYSTEMS: A comprehensive ROS was conducted with the patient and is negative except as per HPI and below:  ROS    OBJECTIVE:   VITAL SIGNS: BP (!) 172/102 (BP Location: Left Arm, Patient Position: Sitting, Cuff Size: Normal)   Pulse 95   Temp 97.8 F (36.6 C)   Ht 5' (1.524 m)   Wt 164 lb (74.4 kg)   LMP 11/06/2012   SpO2 98%   BMI 32.03 kg/m    PHYSICAL EXAM:  General: Pt appears well and is in NAD  Neck: General: Supple without adenopathy or carotid bruits. Thyroid: Thyroid size normal.  No goiter or nodules appreciated. No thyroid bruit.  Lungs: Clear with good BS bilat with no rales, rhonchi, or wheezes  Heart: RRR with normal S1 and S2 and no gallops; no murmurs; no rub  Abdomen: Normoactive bowel sounds, soft, nontender, without masses or organomegaly palpable  Extremities:  Lower extremities - No pretibial edema. No lesions.  Skin: Normal texture and temperature to palpation. No rash noted. No Acanthosis nigricans/skin tags. No lipohypertrophy.  Neuro: MS is good with appropriate affect, pt is  alert and Ox3    DM foot exam:  12/25/2019  The skin of the feet is intact without sores or ulcerations. The pedal pulses are 2+ on right and 2+ on left. The sensation is absent on the right and decreased on the left  to a screening 5.07, 10 gram monofilament   DATA REVIEWED:  Lab Results  Component Value Date   HGBA1C 9.9 (H) 11/09/2019   HGBA1C 9.8 (H) 05/13/2015   HGBA1C 10.7 (H) 02/13/2014   Lab Results  Component Value Date   MICROALBUR 0.64 07/04/2010   LDLCALC 103 (H) 11/10/2019   CREATININE 0.94 11/13/2019   Lab Results  Component Value Date   MICRALBCREAT 4.7 07/04/2010    Lab Results  Component Value Date   CHOL 163 11/10/2019   HDL 38 (L) 11/10/2019   LDLCALC 103 (H) 11/10/2019   TRIG 108 11/10/2019   CHOLHDL 4.3 11/10/2019        ASSESSMENT / PLAN / RECOMMENDATIONS:   1) Type 2 Diabetes Mellitus, Poorly controlled, With neuropathic complications - Most recent A1c of 9.9%. Goal A1c <7.0%.  Plan: GENERAL: I have discussed with the patient the pathophysiology of diabetes. We went over the natural progression of the disease. We talked about both insulin resistance and insulin deficiency. We stressed the importance of lifestyle changes including diet and exercise. I explained the complications associated with diabetes including retinopathy, nephropathy, neuropathy as well as increased risk of cardiovascular disease. We went over the benefit seen with glycemic control.    I explained to the patient that diabetic patients are at higher than normal risk for amputations.  We discussed the importance of avoiding sugar sweetened beverages, and avoiding snacks.  We also discussed options for low carb snacking.  We discussed add-on therapy with sulfonylureas, as well as SGLT2 inhibitors, after discussing the risk and the benefit of both classes, patient opted to proceed with glipizide.  We discussed the risk of weight gain and hypoglycemia with glipizide, patient  encouraged to try and exercise as much as possible to help offset the weight gain.  MEDICATIONS: - Start Glipizide 5 mg, 1 tablet before Breakfast and Before Supper  - Continue trulicity 1.5 mg weekly    EDUCATION / INSTRUCTIONS:  BG monitoring instructions: Patient is instructed to check her blood sugars 2 times a day, fasting and supper.  Call [facility] if: BG persistently < 70 or > 300. . I reviewed the Rule of 15 for the treatment of hypoglycemia in detail with the patient. Literature supplied.   2) Diabetic complications:   Eye: Does not have known diabetic retinopathy.   Neuro/ Feet: Does have known diabetic peripheral neuropathy.  Renal: Patient does not have known baseline CKD. She is on an ACEI/ARB at present.Up to date on  urine albumin/creatinine ratio 03/2019.   3) Lipids: Patient is on Atorvastatin 10 mg daily . Tolerating it well. LDL 103 mg/dL If this remained elevated will consider increasing the dose in the future.    4) Hypertension: Above goal of < 140/90 mmHg. Will defer to PCP.    F/U in 3 months    Signed electronically by: Mack Guise, MD  [facility] End[facility]ology  [facility] [address] [place], [place] [address] Phone: [phone] FAX: [phone]   CC: Filiberto Pinks [address] [place] [address] Phone: [phone]  Fax: [phone]    Return to Endocrinology clinic as below: Future Appointments  Date Time Provider [place]  03/23/2020 11:30 AM Cameron Sprang,  MD [facility] None  03/25/2020 10:50 AM Ryheem Jay, Melanie Crazier, MD [facility] None

## 2020-02-25 ENCOUNTER — Other Ambulatory Visit: Payer: Self-pay | Admitting: Internal Medicine

## 2020-02-25 DIAGNOSIS — I10 Essential (primary) hypertension: Secondary | ICD-10-CM

## 2020-02-26 ENCOUNTER — Telehealth: Payer: Self-pay | Admitting: Internal Medicine

## 2020-02-26 NOTE — Telephone Encounter (Signed)
MEDICATION: test strips  PHARMACY:   [facility] [place], [place] AT [place] Phone:  [phone]  Fax:  [phone]     IS THIS A 90 DAY SUPPLY : yes  IS PATIENT OUT OF MEDICATION: no  IF NOT; HOW MUCH IS LEFT: 10 strips  LAST APPOINTMENT DATE: @1 /14/2021  NEXT APPOINTMENT DATE:@4 /15/2021  DO WE HAVE YOUR PERMISSION TO LEAVE A DETAILED MESSAGE: yes  OTHER COMMENTS:    **Let patient know to contact pharmacy at the end of the day to make sure medication is ready. **  ** Please notify patient to allow 48-72 hours to process**  **Encourage patient to contact the pharmacy for refills or they can request refills through [facility]**

## 2020-03-21 ENCOUNTER — Other Ambulatory Visit: Payer: Self-pay | Admitting: Internal Medicine

## 2020-03-23 ENCOUNTER — Ambulatory Visit (INDEPENDENT_AMBULATORY_CARE_PROVIDER_SITE_OTHER): Payer: Medicare Other | Admitting: Neurology

## 2020-03-23 ENCOUNTER — Encounter: Payer: Self-pay | Admitting: Neurology

## 2020-03-23 ENCOUNTER — Other Ambulatory Visit: Payer: Self-pay

## 2020-03-23 VITALS — BP 170/111 | HR 87 | Ht 60.0 in | Wt 161.2 lb

## 2020-03-23 DIAGNOSIS — G40009 Localization-related (focal) (partial) idiopathic epilepsy and epileptic syndromes with seizures of localized onset, not intractable, without status epilepticus: Secondary | ICD-10-CM | POA: Diagnosis not present

## 2020-03-23 NOTE — Patient Instructions (Signed)
Great to see you! Schedule 48-hour EEG. Continue current medications. Follow-up in 4-5 months, call for any changes  Seizure Precautions: 1. If medication has been prescribed for you to prevent seizures, take it exactly as directed.  Do not stop taking the medicine without talking to your doctor first, even if you have not had a seizure in a long time.   2. Avoid activities in which a seizure would cause danger to yourself or to others.  Don't operate dangerous machinery, swim alone, or climb in high or dangerous places, such as on ladders, roofs, or girders.  Do not drive unless your doctor says you may.  3. If you have any warning that you may have a seizure, lay down in a safe place where you can't hurt yourself.    4.  No driving for 6 months from last seizure, as per [facility].   Please refer to the following link on the [facility] website for more information: http://www.epilepsyfoundation.org/answerplace/Social/driving/drivingu.cfm   5.  Maintain good sleep hygiene.  Avoid alcohol.  6.  Contact your doctor if you have any problems that may be related to the medicine you are taking.  7.  Call 911 and bring the patient back to the ED if:        A.  The seizure lasts longer than 5 minutes.       B.  The patient doesn't awaken shortly after the seizure  C.  The patient has new problems such as difficulty seeing, speaking or moving  D.  The patient was injured during the seizure  E.  The patient has a temperature over 102 F (39C)  F.  The patient vomited and now is having trouble breathing

## 2020-03-23 NOTE — Progress Notes (Signed)
NEUROLOGY FOLLOW UP OFFICE NOTE  [name] VD:[id number] [birth year]  HISTORY OF PRESENT ILLNESS: I had the pleasure of seeing Makayla Frazier in follow-up in the neurology clinic on 03/23/2020.  The patient was last seen 4 months ago for seizures. She is alone in the office today. She denies any further episodes of loss of consciousness since 10/2019. She increased Levetiracetam back to 500mg  BID and denies any significant side effects like before. Her husband continues to tell her she shakes in her sleep, probably every other night. She wakes up with her jaws sore around three times a week. No incontinence. She notices she stares off and people have to shake her sometimes. She is also on Topiramate 25mg  qhs and states the migraines are not like they were, satisfied with current control. She is also on Pregabalin 100mg  TID for neuropathy, her feet hurt and legs jump a lot. She has numbness and tingling in both leg. No falls. Sleep is good.   History on Initial Assessment 11/20/2019: This is a 49 year old right-handed woman with a history of hypertension, hyperlipidemia, thyroid disease, depression, PE on Xarelto, presenting for evaluation of seizures. Records from her prior neurologist were reviewed, she was last seen at [facility] in 2016. Per records she started having seizures in 2009, although she recalled passing out spells when younger. Seizures were described as feeling hot then passing at, at times with shaking. She has a bad headache after. She had a cardiology evaluation with a loop recorder reportedly normal. She was started on Topiramate at one point, however with abrupt increase from 25mg  BID to 100mg  BID, she had significant cognitive side effects. She has been on Topiramate 25mg  at bedtime for several years for migraine prophylaxis. She reports her last seizure was in December 2015 when she had tremors in her sleep (not convulsions). There is a normal EEG report from 2015. She  continued to report nocturnal events and was started on Levetiracetam 500mg  BID. Since starting Levetiracetam, the nocturnal shaking episodes decreased, and she did not have any daytime episodes until she had an episode of shaking with incontinence 7 months ago, then she was brought to [facility] after an episode of loss of consciousness on 11/08/2019. She recalls feeling a pull on her chest, then woke up on the ground. No tongue bite or incontinence, her husband did not witness any shaking. She was a little out of it after with a slight headache, her right arm and leg were weaker and numb, she was dragging her leg like it was deadweight. It took 1-2 days to feel normal. I personally reviewed MRI brain without contrast which did not show any acute changes, hippocampi symmetric, partially empty sella. Her wake and sleep EEG was within normal limits. She reported that she had cut down Levetiracetam to 500mg  at bedtime a year ago due to drowsiness and nausea, however since hospital discharge she is back to taking 500mg  BID with nausea medication. Her husband has mentioned episodes where she would stare off or get a little confused, none recently. She has occasional mild tremors in both hands. She denies any olfactory/gustatory hallucinations. She reports similar transient right-sided weakness after a spell last year. She has been doing PT and was told by PT yesterday to use a walker. She denies any neck/back pain.  She has occasional migraines with good response to low dose Topiramate 25mg  at bedtime. When off medications, she gets very photosensitive. Last migraine was last summer. She takes Maxalt prn.  She has been on Pregabalin 100mg  TID for neuropathy over the past year, which helps with numbness/tingling in both feet. In the hospital, she had urinary retention, MRI thoracic and lumbar spine were unremarkable, urinary retention has resolved. She has a lot of constipation. She reports a sleep study where she was told she  "stopped breathing 10x/hr but did not start CPAP." Her last sleep study in 2015 reportedly showed OSA. She recalls having a prolonged EEG in the past but with no results due to technical difficulties.   Epilepsy Risk Factors:  Maternal grandfather and some cousins had seizures. Otherwise she had a normal birth and early development.  There is no history of febrile convulsions, CNS infections such as meningitis/encephalitis, significant traumatic brain injury, neurosurgical procedures.   PAST MEDICAL HISTORY: Past Medical History:  Diagnosis Date  . Anemia   . Arthritis    Left leg  . Asthma   . BV (bacterial vaginosis) 2012  . Complication of anesthesia    Seizures in PACU after surgery 3/15  . DM (diabetes mellitus) ([place])    diagnosed 2009  . DVT (deep venous thrombosis) ([place]) 2011   pt had IUD; also 05/2012  . Dysfunctional uterine bleeding    Seen by Dr. Leo Grosser, GYN; pending hysterectomy as of 07/2012  . H/O hypercoagulable state    2/2 contraception  . Headache(784.0)    migraines  . Herpes simplex without mention of complication 0000000   HSV-2  . HLD (hyperlipidemia)   . Hypertension   . Infection    UTI  . Labial lesion 2013  . Major depression    Hx suicide attempt in 2009, [facility] admissions  . Mastodynia   . Neuromuscular disorder (HCC)    neuropathy  . Neuropathy   . Obesity   . Oligomenorrhea 2011  . PE (pulmonary embolism)    2009 - on oral contraception; multiple  . Post - coital bleeding 2012  . Seizures ([place]) 2015   last episode May 2015  . Sleep apnea   . SORE THROAT 08/10/2010   Qualifier: Diagnosis of  By: Vanessa Kick MD, Saralyn Pilar    . Status post placement of implantable loop recorder   . Stroke [facility])    TIA 2013  . Syncopal episodes    unknown etiology  . Thyroid disease    hypothyroidism (pt denies)  . Vaginal Pap smear, abnormal   . Venous insufficiency   . Vitamin D deficiency    unknown to pt.    MEDICATIONS: Current Outpatient Medications on  File Prior to Visit  Medication Sig Dispense Refill  . acetaminophen (TYLENOL) 500 MG tablet Take 1,000 mg by mouth every 6 (six) hours as needed for moderate pain.     Marland Kitchen albuterol (PROVENTIL HFA;VENTOLIN HFA) 108 (90 BASE) MCG/ACT inhaler Inhale 2 puffs into the lungs every 6 (six) hours as needed for wheezing or shortness of breath.    Marland Kitchen amLODipine (NORVASC) 5 MG tablet Take 2 tablets (10 mg total) by mouth daily. (Patient taking differently: Take 10 mg by mouth at bedtime. ) 90 tablet 3  . atorvastatin (LIPITOR) 10 MG tablet Take 10 mg by mouth at bedtime.    . cetirizine (ZYRTEC) 10 MG tablet Take 1 tablet (10 mg total) by mouth daily. (Patient taking differently: Take 10 mg by mouth at bedtime. ) 90 tablet 3  . chlorthalidone (HYGROTON) 25 MG tablet TAKE 2 TABLETS(50 MG) BY MOUTH DAILY (Patient taking differently: Take 50 mg by mouth at bedtime. ) 180  tablet 0  . EPINEPHrine 0.3 mg/0.3 mL IJ SOAJ injection Inject 0.3 mg into the muscle as needed for anaphylaxis.     Marland Kitchen escitalopram (LEXAPRO) 10 MG tablet Take 10 mg by mouth at bedtime.     . fluticasone (FLONASE) 50 MCG/ACT nasal spray Place 1 spray into both nostrils daily as needed (for nasal congestion/sinus infection).     Marland Kitchen levETIRAcetam (KEPPRA) 500 MG tablet Take 1 tablet (500 mg total) by mouth 2 (two) times daily. 180 tablet 3  . lisinopril (ZESTRIL) 5 MG tablet Take 2.5 mg by mouth daily.    . montelukast (SINGULAIR) 10 MG tablet Take 10 mg by mouth at bedtime. Reported on 02/16/2016    . ondansetron (ZOFRAN) 4 MG tablet Take 4 mg by mouth as needed for nausea/vomiting.    . pregabalin (LYRICA) 100 MG capsule TAKE 1 CAPSULE(100 MG) BY MOUTH THREE TIMES DAILY (Patient taking differently: Take 100 mg by mouth 3 (three) times daily. ) 90 capsule 0  . rivaroxaban (XARELTO) 20 MG TABS tablet Take 20 mg by mouth at bedtime.     . rizatriptan (MAXALT-MLT) 5 MG disintegrating tablet Take 1 tablet (5 mg total) by mouth as needed for migraine. May  repeat in 2 hours if needed 15 tablet 12  . SYMBICORT 160-4.5 MCG/ACT inhaler Inhale 2 puffs into the lungs 2 (two) times daily.    Marland Kitchen topiramate (TOPAMAX) 25 MG tablet Take 25 mg by mouth at bedtime.     . TRULICITY 1.5 0000000 SOPN Inject 1.5 mg into the skin once a week. Fridays     No current facility-administered medications on file prior to visit.    ALLERGIES: Allergies  Allergen Reactions  . Silver Other (See Comments) and Rash  . Codeine Nausea And Vomiting    Pt takes Percocet without problems   . Darvocet [Propoxyphene N-Acetaminophen] Nausea And Vomiting  . Hydrocodone-Acetaminophen Nausea And Vomiting  . Hydrocodone-Acetaminophen Nausea And Vomiting    Vomiting  . Latex Rash  . Tape Itching and Rash  . Tramadol Nausea Only    FAMILY HISTORY: Family History  Problem Relation Age of Onset  . Melanoma Father 44  . Diabetes Father   . Hypertension Father   . Kidney disease Father   . Ulcerative colitis Mother 71  . Diabetes Mother   . Hypertension Mother   . Breast cancer Paternal Grandmother   . Cancer Paternal Grandmother   . Diabetes type II Maternal Grandmother        deceased 28  . Diabetes Maternal Grandmother   . Pulmonary embolism Paternal Grandfather   . Stroke Maternal Grandfather     SOCIAL HISTORY: Social History   Socioeconomic History  . Marital status: Married    Spouse name: Not on file  . Number of children: Not on file  . Years of education: Not on file  . Highest education level: Not on file  Occupational History  . Occupation: International aid/development worker: UNEMPLOYED    Comment: Disabled [id number] due to PE complications  Tobacco Use  . Smoking status: Never Smoker  . Smokeless tobacco: Never Used  Substance and Sexual Activity  . Alcohol use: Yes    Comment: once in a while   . Drug use: No  . Sexual activity: Yes    Partners: Male    Birth control/protection: Surgical  Other Topics Concern  . Not on file  Social History Narrative    Lives in [place]   Lives  with significant other, Gwyndolyn Saxon   Completed 10th grade.   Worker Compensation Case 2009-2012 related to PE         Epworth Sleepiness Scale      Total score:  13      --I have high BP   --I have had a stroke   --I have had insomnia   --I have been told that I stop breathing during sleep   --I wake up to urinate frequently at night   --I wake up with a dry mouth and a sore throat   --I have Diabetes   --I have been told that I snore   --I am overweight or am gaining weight   --I have problems with memory/concentration   --I awake feeling not rested   --I frequently awake with headaches   --I have dozed off or fallen asleep at inappropriate times during the day   Social Determinants of Health   Financial Resource Strain:   . Difficulty of Paying Living Expenses:   Food Insecurity:   . Worried About Charity fundraiser in the Last Year:   . Arboriculturist in the Last Year:   Transportation Needs:   . Film/video editor (Medical):   Marland Kitchen Lack of Transportation (Non-Medical):   Physical Activity:   . Days of Exercise per Week:   . Minutes of Exercise per Session:   Stress:   . Feeling of Stress :   Social Connections:   . Frequency of Communication with Friends and Family:   . Frequency of Social Gatherings with Friends and Family:   . Attends Religious Services:   . Active Member of Clubs or Organizations:   . Attends Archivist Meetings:   Marland Kitchen Marital Status:   Intimate Partner Violence:   . Fear of Current or Ex-Partner:   . Emotionally Abused:   Marland Kitchen Physically Abused:   . Sexually Abused:     REVIEW OF SYSTEMS: Constitutional: No fevers, chills, or sweats, no generalized fatigue, change in appetite Eyes: No visual changes, double vision, eye pain Ear, nose and throat: No hearing loss, ear pain, nasal congestion, sore throat Cardiovascular: No chest pain, palpitations Respiratory:  No shortness of breath at rest or with exertion,  wheezes GastrointestinaI: No nausea, vomiting, diarrhea, abdominal pain, fecal incontinence Genitourinary:  No dysuria, urinary retention or frequency Musculoskeletal:  No neck pain, back pain Integumentary: No rash, pruritus, skin lesions Neurological: as above Psychiatric: No depression, insomnia, anxiety Endocrine: No palpitations, fatigue, diaphoresis, mood swings, change in appetite, change in weight, increased thirst Hematologic/Lymphatic:  No anemia, purpura, petechiae. Allergic/Immunologic: no itchy/runny eyes, nasal congestion, recent allergic reactions, rashes  PHYSICAL EXAM: Vitals:   03/23/20 1120 03/23/20 1126  BP: (!) 165/105 (!) 168/115  Pulse: 85 87  SpO2: 97%    General: No acute distress Head:  Normocephalic/atraumatic Skin/Extremities: No rash, no edema Neurological Exam: alert and oriented to person, place, and time. No aphasia or dysarthria. Fund of knowledge is appropriate.  Recent and remote memory are intact.  Attention and concentration are normal.  Cranial nerves: Pupils equal, round, reactive to light.  Extraocular movements intact with no nystagmus. Visual fields full. No facial asymmetry. Motor: Bulk and tone normal, muscle strength 5/5 throughout with no pronator drift.  Sensation decreased to pin and cold on left V2 and UE. Decreased pin, cold, vibration to ankles bilaterally. Deep tendon reflexes +1 throughout.  Finger to nose testing intact.  Gait narrow-based and steady, reporting feet pain.  IMPRESSION: This is a 49 yo RH woman with a history of hypertension, hyperlipidemia, thyroid disease, depression, PE on Xarelto, migraines, neuropathy, with recurrent seizures. She reports a history of nocturnal shaking episodes, as well as episodes of loss of consciousness with post-event right-sided weakness suggestive of focal to bilateral tonic-clonic seizures arising from the left hemisphere. MRI brain and routine EEG unremarkable. No further episodes of loss of  consciousness since 10/2019, however her husband continues to report nocturnal shaking episodes. A 48-hour EEG will be ordered to classify these episodes. Continue Levetiracetam 500mg  BID. She is also on Topiramate 25mg  qhs for migraines and Pregabalin 100mg  TID for neuropathy. [place] driving laws were again discussed with the patient, she knows to stop driving after a seizure, until 6 months seizure-free. Discuss BP and glucose levels with PCP. Follow-up in 4-5 months, she knows to call for any changes.   Thank you for allowing me to participate in her care.  Please do not hesitate to call for any questions or concerns.   Ellouise Newer, M.D.   CC: Maude Leriche, PA-C

## 2020-03-25 ENCOUNTER — Ambulatory Visit: Payer: Medicare Other | Admitting: Internal Medicine

## 2020-03-29 ENCOUNTER — Other Ambulatory Visit: Payer: Self-pay

## 2020-03-29 ENCOUNTER — Ambulatory Visit (INDEPENDENT_AMBULATORY_CARE_PROVIDER_SITE_OTHER): Payer: Medicare Other | Admitting: Internal Medicine

## 2020-03-29 ENCOUNTER — Encounter: Payer: Self-pay | Admitting: Internal Medicine

## 2020-03-29 VITALS — BP 128/88 | HR 72 | Temp 98.7°F | Ht 60.0 in | Wt 160.6 lb

## 2020-03-29 DIAGNOSIS — E1140 Type 2 diabetes mellitus with diabetic neuropathy, unspecified: Secondary | ICD-10-CM

## 2020-03-29 DIAGNOSIS — E1165 Type 2 diabetes mellitus with hyperglycemia: Secondary | ICD-10-CM | POA: Diagnosis not present

## 2020-03-29 DIAGNOSIS — Z794 Long term (current) use of insulin: Secondary | ICD-10-CM

## 2020-03-29 LAB — GLUCOSE, POCT (MANUAL RESULT ENTRY): POC Glucose: 318 mg/dl — AB (ref 70–99)

## 2020-03-29 LAB — POCT GLYCOSYLATED HEMOGLOBIN (HGB A1C): Hemoglobin A1C: 10.9 % — AB (ref 4.0–5.6)

## 2020-03-29 MED ORDER — LANTUS SOLOSTAR 100 UNIT/ML ~~LOC~~ SOPN: 16.0000 [IU] | PEN_INJECTOR | Freq: Every day | SUBCUTANEOUS | 11 refills | Status: DC

## 2020-03-29 MED ORDER — GLUCOSE BLOOD VI STRP: 1.0000 | ORAL_STRIP | 11 refills | Status: DC | PRN

## 2020-03-29 NOTE — Progress Notes (Signed)
Name: Makayla Frazier  Age/ Sex: 49 y.o., female   MRN/ DOB: [id number], 1971-11-18     PCP: Perlie Mayo Durel Salts   Reason for Endocrinology Evaluation: Type 2 Diabetes Mellitus  Initial Endocrine Consultative Visit:  12/25/2019    PATIENT IDENTIFIER: Makayla Frazier is a 49 y.o. female with a past medical history of T2DM, HTN and seizure disorder . The patient has followed with Endocrinology clinic since 12/25/2019 for consultative assistance with management of her diabetes.  DIABETIC HISTORY:  Makayla Frazier was diagnosed with T2DM 2009.  She was initially on glipizide and Metformin, but developed diarrhea.  She is intolerant to Victoza due to rash.  Invokana because genital infections. Her hemoglobin A1c has ranged from  7.0% in 2011, peaking at 11.3% in 3013.   On her initial visit to our clinic she had an A1c of [id number], she was on Trulicity only.  Glipizide was added   SUBJECTIVE:   During the last visit (12/25/2019): A1c 9.9%, we continue Trulicity and started glipizide.  Today (03/29/2020): Makayla Frazier  She has not checked her blood sugars she ran out of strips. The patient has not had hypoglycemic episodes since the last clinic visit. Otherwise, the patient has not required any recent emergency interventions for hypoglycemia and has not had recent hospitalizations secondary to hyper or hypoglycemic episodes.     ROS: As per HPI and as detailed below: Review of Systems  Gastrointestinal: Positive for diarrhea. Negative for nausea.      HOME DIABETES REGIMEN:  Glipizide 5 mg twice daily- not taking it  Trulicity 1.5 mg weekly (Fridays)    Statin: Yes ACE-I/ARB: Yes    METER DOWNLOAD SUMMARY: Did not bring    DIABETIC COMPLICATIONS: Microvascular complications:   Neuropathy  Denies: CKD , retinopathy  Last eye exam: Completed 2020  Macrovascular complications:   TIA  Denies: CAD, PVD, CVA  HISTORY:  Past Medical History:  Past Medical History:    Diagnosis Date  . Anemia   . Arthritis    Left leg  . Asthma   . BV (bacterial vaginosis) 2012  . Complication of anesthesia    Seizures in PACU after surgery 3/15  . DM (diabetes mellitus) ([place])    diagnosed 2009  . DVT (deep venous thrombosis) ([place]) 2011   pt had IUD; also 05/2012  . Dysfunctional uterine bleeding    Seen by Dr. Leo Grosser, GYN; pending hysterectomy as of 07/2012  . H/O hypercoagulable state    2/2 contraception  . Headache(784.0)    migraines  . Herpes simplex without mention of complication 0000000   HSV-2  . HLD (hyperlipidemia)   . Hypertension   . Infection    UTI  . Labial lesion 2013  . Major depression    Hx suicide attempt in 2009, [facility] admissions  . Mastodynia   . Neuromuscular disorder (HCC)    neuropathy  . Neuropathy   . Obesity   . Oligomenorrhea 2011  . PE (pulmonary embolism)    2009 - on oral contraception; multiple  . Post - coital bleeding 2012  . Seizures ([place]) 2015   last episode May 2015  . Sleep apnea   . SORE THROAT 08/10/2010   Qualifier: Diagnosis of  By: Vanessa Kick MD, Saralyn Pilar    . Status post placement of implantable loop recorder   . Stroke [facility])    TIA 2013  . Syncopal episodes    unknown etiology  . Thyroid disease    hypothyroidism (  pt denies)  . Vaginal Pap smear, abnormal   . Venous insufficiency   . Vitamin D deficiency    unknown to pt.   Past Surgical History:  Past Surgical History:  Procedure Laterality Date  . BILATERAL SALPINGECTOMY  12/18/2012   Procedure: BILATERAL SALPINGECTOMY;  Surgeon: Eldred Manges, MD;  Location: [facility];  Service: Gynecology;  Laterality: Bilateral;  . BLADDER SUSPENSION N/A 02/13/2014   Procedure: TRANSVAGINAL TAPE (TVT) PROCEDURE;  Surgeon: Delice Lesch, MD;  Location: [facility];  Service: Gynecology;  Laterality: N/A;  . BREAST REDUCTION SURGERY     Dr. Eugene Garnet [facility] 2011  . CARDIAC ELECTROPHYSIOLOGY STUDY & DFT     Implantable Loop recorder; no arrhythmias associated with  synocpal spells; loop explanted 07-2013  . CHOLECYSTECTOMY    . DILATATION & CURRETTAGE/HYSTEROSCOPY WITH RESECTOCOPE  2007 & 2013  . ENDOMETRIAL BIOPSY  2011  . IUD REMOVAL  12/18/2012   Procedure: INTRAUTERINE DEVICE (IUD) REMOVAL;  Surgeon: Eldred Manges, MD;  Location: [facility];  Service: Gynecology;  Laterality: N/A;  Removed during prep by E. Florene Glen PA  . LAPAROSCOPIC ASSISTED VAGINAL HYSTERECTOMY  12/18/2012   Procedure: LAPAROSCOPIC ASSISTED VAGINAL HYSTERECTOMY;  Surgeon: Eldred Manges, MD;  Location: [facility];  Service: Gynecology;  Laterality: N/A;  . LOOP RECORDER EXPLANT N/A 07/17/2013   Procedure: LOOP RECORDER EXPLANT;  Surgeon: Thompson Grayer, MD;  [facility] C[facility];  Service: Cardiovascular;  Laterality: N/A;  . TRANSVAGINAL TAPE (TVT) REMOVAL N/A 04/28/2014   Procedure: Removal of suburethral mesh;  Surgeon: Delice Lesch, MD;  Location: [facility];  Service: Gynecology;  Laterality: N/A;  . WISDOM TOOTH EXTRACTION      Social History:  reports that she has never smoked. She has never used smokeless tobacco. She reports current alcohol use. She reports that she does not use drugs. Family History:  Family History  Problem Relation Age of Onset  . Melanoma Father 68  . Diabetes Father   . Hypertension Father   . Kidney disease Father   . Ulcerative colitis Mother 23  . Diabetes Mother   . Hypertension Mother   . Breast cancer Paternal Grandmother   . Cancer Paternal Grandmother   . Diabetes type II Maternal Grandmother        deceased 54  . Diabetes Maternal Grandmother   . Pulmonary embolism Paternal Grandfather   . Stroke Maternal Grandfather      HOME MEDICATIONS: Allergies as of 03/29/2020      Reactions   Silver Other (See Comments), Rash   Codeine Nausea And Vomiting   Pt takes Percocet without problems   Darvocet [propoxyphene N-acetaminophen] Nausea And Vomiting   Glipizide    Diarrhea and abdominal pain    Hydrocodone-acetaminophen Nausea And Vomiting    Hydrocodone-acetaminophen Nausea And Vomiting   Vomiting   Latex Rash   Tape Itching, Rash   Tramadol Nausea Only      Medication List       Accurate as of March 29, 2020  9:40 AM. If you have any questions, ask your nurse or doctor.        acetaminophen 500 MG tablet Commonly known as: TYLENOL Take 1,000 mg by mouth every 6 (six) hours as needed for moderate pain.   albuterol 108 (90 Base) MCG/ACT inhaler Commonly known as: VENTOLIN HFA Inhale 2 puffs into the lungs every 6 (six) hours as needed for wheezing or shortness of breath.   amLODipine 5 MG tablet Commonly known  as: NORVASC Take 2 tablets (10 mg total) by mouth daily. What changed: when to take this   atorvastatin 10 MG tablet Commonly known as: LIPITOR Take 10 mg by mouth at bedtime.   cetirizine 10 MG tablet Commonly known as: ZYRTEC Take 1 tablet (10 mg total) by mouth daily. What changed: when to take this   chlorthalidone 25 MG tablet Commonly known as: HYGROTON TAKE 2 TABLETS(50 MG) BY MOUTH DAILY What changed: See the new instructions.   EPINEPHrine 0.3 mg/0.3 mL Soaj injection Commonly known as: EPI-PEN Inject 0.3 mg into the muscle as needed for anaphylaxis.   escitalopram 10 MG tablet Commonly known as: LEXAPRO Take 10 mg by mouth at bedtime.   fluticasone 50 MCG/ACT nasal spray Commonly known as: FLONASE Place 1 spray into both nostrils daily as needed (for nasal congestion/sinus infection).   glucose blood test strip 1 each by Other route as needed for other. Use as instructed to test blood sugar  3 times daily E11.65 What changed: additional instructions Changed by: Jeannetta Nap, CMA   Lantus SoloStar 100 UNIT/ML Solostar Pen Generic drug: insulin glargine Inject 16 Units into the skin daily. Started by: Dorita Sciara, MD   levETIRAcetam 500 MG tablet Commonly known as: KEPPRA Take 1 tablet (500 mg total) by mouth 2 (two) times daily.   lisinopril 5 MG  tablet Commonly known as: ZESTRIL Take 2.5 mg by mouth daily.   montelukast 10 MG tablet Commonly known as: SINGULAIR Take 10 mg by mouth at bedtime. Reported on 02/16/2016   ondansetron 4 MG tablet Commonly known as: ZOFRAN Take 4 mg by mouth as needed for nausea/vomiting.   pregabalin 100 MG capsule Commonly known as: LYRICA TAKE 1 CAPSULE(100 MG) BY MOUTH THREE TIMES DAILY What changed: See the new instructions.   rizatriptan 5 MG disintegrating tablet Commonly known as: Maxalt-MLT Take 1 tablet (5 mg total) by mouth as needed for migraine. May repeat in 2 hours if needed   Symbicort 160-4.5 MCG/ACT inhaler Generic drug: budesonide-formoterol Inhale 2 puffs into the lungs 2 (two) times daily.   topiramate 25 MG tablet Commonly known as: TOPAMAX Take 25 mg by mouth at bedtime.   Trulicity 1.5 0000000 Sopn Generic drug: Dulaglutide Inject 1.5 mg into the skin once a week. Fridays   Xarelto 20 MG Tabs tablet Generic drug: rivaroxaban Take 20 mg by mouth at bedtime.        OBJECTIVE:   Vital Signs: BP 128/88 (BP Location: Left Arm, Patient Position: Sitting, Cuff Size: Large)   Pulse 72   Temp 98.7 F (37.1 C)   Ht 5' (1.524 m)   Wt 160 lb 9.6 oz (72.8 kg)   LMP 11/06/2012   SpO2 94%   BMI 31.37 kg/m   Wt Readings from Last 3 Encounters:  03/29/20 160 lb 9.6 oz (72.8 kg)  03/23/20 161 lb 3.2 oz (73.1 kg)  12/25/19 164 lb (74.4 kg)     Exam: General: Pt appears well and is in NAD  Lungs: Clear with good BS bilat with no rales, rhonchi, or wheezes  Heart: RRR with normal S1 and S2 and no gallops; no murmurs; no rub  Abdomen: Normoactive bowel sounds, soft, nontender, without masses or organomegaly palpable  Extremities: No pretibial edema.   Neuro: MS is good with appropriate affect, pt is alert and Ox3   DM foot exam: 12/25/2019  The skin of the feet is intact without sores or ulcerations. The pedal pulses are 2+  on right and 2+ on left. The  sensation is absent on the right and decreased on the left  to a screening 5.07, 10 gram monofilament      DATA REVIEWED:  Lab Results  Component Value Date   HGBA1C 10.9 (A) 03/29/2020   HGBA1C 9.9 (H) 11/09/2019   HGBA1C 9.8 (H) 05/13/2015   Lab Results  Component Value Date   MICROALBUR 0.64 07/04/2010   LDLCALC 103 (H) 11/10/2019   CREATININE 0.94 11/13/2019   Lab Results  Component Value Date   MICRALBCREAT 4.7 07/04/2010     Lab Results  Component Value Date   CHOL 163 11/10/2019   HDL 38 (L) 11/10/2019   LDLCALC 103 (H) 11/10/2019   TRIG 108 11/10/2019   CHOLHDL 4.3 11/10/2019         ASSESSMENT / PLAN / RECOMMENDATIONS:   Type 2 Diabetes Mellitus, Poorly controlled, With neuropathic complications - Most recent A1c of 10.9 %. Goal A1c <7.0%.  - Worsening A1c , up from 10.9%  - We started her on Glipizide last visit but she stopped it a month in to taking it due to GI side effects - She is intolerant to Metformin as well  - We discussed adding insulin, which she is ok with, has been on prandial insulin in the past  - We have discussed the importance of dietary discretions in controlling Bg's    MEDICATIONS: Start Lantus 16 units ONCE daily  Continue Trulicity 1.5 mg weekly   EDUCATION / INSTRUCTIONS:  BG monitoring instructions: Patient is instructed to check her blood sugars 1 times a day.  Call [facility] if: BG persistently < 70 or > 300.B . I reviewed the Rule of 15 for the treatment of hypoglycemia in detail with the patient. Literature supplied.     2) Diabetic complications:   Eye: Does not have known diabetic retinopathy.   Neuro/ Feet: Does have known diabetic peripheral neuropathy.  Renal: Patient does not have known baseline CKD. She is on an ACEI/ARB at present.Up to date on  urine albumin/creatinine ratio 03/2019.   3) Lipids: Patient is on Atorvastatin 10 mg daily . Tolerating it well. LDL 103 mg/dL If this  remained elevated will consider increasing the dose in the future.   F/U in 3 months    Signed electronically by: Mack Guise, MD  [facility] Endocrinology  [facility] [address] [place], [place] [address] Phone: [phone] FAX: [phone]   CC: [address] [place] [address] Phone: [phone]  Fax: [phone]  Return to Endocrinology clinic as below: Future Appointments  Date Time Provider [place]  06/28/2020  2:40 PM Brighton Delio, Melanie C[place], MD [id number] None  08/04/2020 11:30 AM Cameron Sprang, MD [facility] None

## 2020-03-29 NOTE — Patient Instructions (Addendum)
-   Start Lantus 16 units ONCE daily  - Continue Trulicity 1.5 mg weekly       HOW TO TREAT LOW BLOOD SUGARS (Blood sugar LESS THAN 70 MG/DL)  Please follow the RULE OF 15 for the treatment of hypoglycemia treatment (when your (blood sugars are less than 70 mg/dL)    STEP 1: Take 15 grams of carbohydrates when your blood sugar is low, which includes:   3-4 GLUCOSE TABS  OR  3-4 OZ OF JUICE OR REGULAR SODA OR  ONE TUBE OF GLUCOSE GEL     STEP 2: RECHECK blood sugar in 15 MINUTES STEP 3: If your blood sugar is still low at the 15 minute recheck --> then, go back to STEP 1 and treat AGAIN with another 15 grams of carbohydrates.

## 2020-04-02 ENCOUNTER — Telehealth: Payer: Self-pay | Admitting: Internal Medicine

## 2020-04-02 ENCOUNTER — Other Ambulatory Visit: Payer: Self-pay

## 2020-04-02 NOTE — Telephone Encounter (Signed)
Medication Refill Request  Did you call your pharmacy and request this refill first? No . If patient has not contacted pharmacy first, instruct them to do so for future refills.  . Remind them that contacting the pharmacy for their refill is the quickest method to get the refill.  . Refill policy also stated that it will take anywhere between 24-72 hours to receive the refill.    Name of medication? New RX for [employer] 3 gauge needles  Is this a 90 day supply? ?  Name and location of pharmacy?  [facility] [place] Phone:  [phone]  Fax:  [phone]      . Is the request for diabetes test strips? No . If yes, what brand?N/A

## 2020-04-05 ENCOUNTER — Other Ambulatory Visit: Payer: Self-pay

## 2020-04-05 MED ORDER — NOVOFINE PLUS 32G X 4 MM MISC: 1.0000 | Freq: Four times a day (QID) | 6 refills | Status: DC

## 2020-05-25 ENCOUNTER — Other Ambulatory Visit: Payer: Self-pay

## 2020-05-25 ENCOUNTER — Telehealth: Payer: Self-pay | Admitting: Internal Medicine

## 2020-05-25 MED ORDER — TRULICITY 1.5 MG/0.5ML ~~LOC~~ SOAJ: 1.5000 mg | SUBCUTANEOUS | 1 refills | Status: DC

## 2020-05-25 NOTE — Telephone Encounter (Signed)
sent 

## 2020-05-25 NOTE — Telephone Encounter (Signed)
Medication Refill Request  Did you call your pharmacy and request this refill first?  Yes-PHARM told patient they have sent requests for refill with no response  If patient has not contacted pharmacy first, instruct them to do so for future refills.   Remind them that contacting the pharmacy for their refill is the quickest method to get the refill.   Refill policy also stated that it will take anywhere between 24-72 hours to receive the refill.    Name of medication?  TRULICITY 1.5 XY/5.8PF SOPN  Is this a 90 day supply? Yes  Name and location of pharmacy?   [facility] [place], [address] Phone:  [phone]  Fax:  [phone]       Is the request for diabetes test strips? Yes  If yes, what brand? N/A

## 2020-06-28 ENCOUNTER — Other Ambulatory Visit: Payer: Self-pay

## 2020-06-28 ENCOUNTER — Ambulatory Visit (INDEPENDENT_AMBULATORY_CARE_PROVIDER_SITE_OTHER): Payer: Medicare Other | Admitting: Internal Medicine

## 2020-06-28 VITALS — BP 146/84 | HR 90 | Ht 61.0 in | Wt 158.8 lb

## 2020-06-28 DIAGNOSIS — E1165 Type 2 diabetes mellitus with hyperglycemia: Secondary | ICD-10-CM | POA: Diagnosis not present

## 2020-06-28 DIAGNOSIS — E1140 Type 2 diabetes mellitus with diabetic neuropathy, unspecified: Secondary | ICD-10-CM | POA: Diagnosis not present

## 2020-06-28 DIAGNOSIS — Z794 Long term (current) use of insulin: Secondary | ICD-10-CM | POA: Diagnosis not present

## 2020-06-28 LAB — POCT GLYCOSYLATED HEMOGLOBIN (HGB A1C): Hemoglobin A1C: 12.3 % — AB (ref 4.0–5.6)

## 2020-06-28 LAB — GLUCOSE, POCT (MANUAL RESULT ENTRY): POC Glucose: 159 mg/dl — AB (ref 70–99)

## 2020-06-28 MED ORDER — TRULICITY 3 MG/0.5ML ~~LOC~~ SOAJ: 3.0000 mg | SUBCUTANEOUS | 6 refills | Status: DC

## 2020-06-28 MED ORDER — TRESIBA FLEXTOUCH 100 UNIT/ML ~~LOC~~ SOPN: 18.0000 [IU] | PEN_INJECTOR | Freq: Every day | SUBCUTANEOUS | 6 refills | Status: DC

## 2020-06-28 NOTE — Progress Notes (Signed)
Name: Makayla Frazier  Age/ Sex: 49 y.o., female   MRN/ DOB: [id number], Jun 09, 1971     PCP: Makayla Frazier   Reason for Endocrinology Evaluation: Type 2 Diabetes Mellitus  Initial Endocrine Consultative Visit:  12/25/2019    PATIENT IDENTIFIER: [name] is a 49 y.o. female with a past medical history of T2DM, HTN and seizure disorder . The patient has followed with Endocrinology clinic since 12/25/2019 for consultative assistance with management of her diabetes.  DIABETIC HISTORY:  [name] was diagnosed with T2DM 2009.  She was initially on glipizide and Metformin, but developed diarrhea.  She is intolerant to Victoza due to rash.  Invokana because genital infections. Her hemoglobin A1c has ranged from  7.0% in 2011, peaking at 11.3% in 3013.   On her initial visit to our clinic she had an A1c of 0.9%, she was on Trulicity only.  Glipizide was added  Glipizide switched to basal insulin in 03/2020 due to worsening hyperglycemia.   SUBJECTIVE:   During the last visit (03/29/2020): A1c 10.9  %, We stopped Glipizide and started Lantus, we continued Trulicity .  Today (06/28/2020): [name] is here for a follow up on diabetes management.  She checked her blood sugars occasionally . The patient has not had hypoglycemic episodes since the last clinic visit.  Has been having issues with sinus , was on Abx but no steroids.    HOME DIABETES REGIMEN:  Lantus 16 units daily  Trulicity 1.5 mg weekly (Fridays)    Statin: Yes ACE-I/ARB: Yes    METER DOWNLOAD SUMMARY: Did not bring    DIABETIC COMPLICATIONS: Microvascular complications:   Neuropathy  Denies: CKD , retinopathy  Last eye exam: Completed 2020  Macrovascular complications:   TIA  Denies: CAD, PVD, CVA  HISTORY:  Past Medical History:  Past Medical History:  Diagnosis Date   Anemia    Arthritis    Left leg   Asthma    BV (bacterial vaginosis) 7353   Complication of  anesthesia    Seizures in PACU after surgery 3/15   DM (diabetes mellitus) ([place])    diagnosed 2009   DVT (deep venous thrombosis) ([place]) 2011   pt had IUD; also 05/2012   Dysfunctional uterine bleeding    Seen by Dr. Leo Grosser, GYN; pending hysterectomy as of 07/2012   H/O hypercoagulable state    2/2 contraception   Headache(784.0)    migraines   Herpes simplex without mention of complication 01/9923   HSV-2   HLD (hyperlipidemia)    Hypertension    Infection    UTI   Labial lesion 2013   Major depression    Hx suicide attempt in 2009, [facility] admissions   Mastodynia    Neuromuscular disorder ([place])    neuropathy   Neuropathy    Obesity    Oligomenorrhea 2011   PE (pulmonary embolism)    2009 - on oral contraception; multiple   Post - coital bleeding 2012   Seizures ([place]) 2015   last episode May 2015   Sleep apnea    SORE THROAT 08/10/2010   Qualifier: Diagnosis of  By: Vanessa Kick MD, Saralyn Pilar     Status post placement of implantable loop recorder    Stroke [facility])    TIA 2013   Syncopal episodes    unknown etiology   Thyroid disease    hypothyroidism (pt denies)   Vaginal Pap smear, abnormal    Venous insufficiency    Vitamin D  deficiency    unknown to pt.   Past Surgical History:  Past Surgical History:  Procedure Laterality Date   BILATERAL SALPINGECTOMY  12/18/2012   Procedure: BILATERAL SALPINGECTOMY;  Surgeon: Eldred Manges, MD;  Location: [facility];  Service: Gynecology;  Laterality: Bilateral;   BLADDER SUSPENSION N/A 02/13/2014   Procedure: TRANSVAGINAL TAPE (TVT) PROCEDURE;  Surgeon: Delice Lesch, MD;  Location: [facility];  Service: Gynecology;  Laterality: N/A;   BREAST REDUCTION SURGERY     Dr. Eugene Garnet [place] DFT     Implantable Loop recorder; no arrhythmias associated with synocpal spells; loop explanted 07-2013   CHOLECYSTECTOMY     DILATATION & CURRETTAGE/HYSTEROSCOPY WITH RESECTOCOPE  2007  & 2013   ENDOMETRIAL BIOPSY  2011   IUD REMOVAL  12/18/2012   Procedure: INTRAUTERINE DEVICE (IUD) REMOVAL;  Surgeon: Eldred Manges, MD;  Location: [facility];  Service: Gynecology;  Laterality: N/A;  Removed during prep by E. Powell PA   LAPAROSCOPIC ASSISTED VAGINAL HYSTERECTOMY  12/18/2012   Procedure: LAPAROSCOPIC ASSISTED VAGINAL HYSTERECTOMY;  Surgeon: Eldred Manges, MD;  Location: [facility];  Service: Gynecology;  Laterality: N/A;   LOOP RECORDER EXPLANT N/A 07/17/2013   Procedure: LOOP RECORDER EXPLANT;  Surgeon: Thompson Grayer, MD;  Location: [facility] CATH LAB;  Service: Cardiovascular;  Laterality: N/A;   TRANSVAGINAL TAPE (TVT) REMOVAL N/A 04/28/2014   Procedure: Removal of suburethral mesh;  Surgeon: Delice Lesch, MD;  Location: [facility];  Service: Gynecology;  Laterality: N/A;   WISDOM TOOTH EXTRACTION      Social History:  reports that she has never smoked. She has never used smokeless tobacco. She reports current alcohol use. She reports that she does not use drugs. Family History:  Family History  Problem Relation Age of Onset   Melanoma Father 5   Diabetes Father    Hypertension Father    Kidney disease Father    Ulcerative colitis Mother 84   Diabetes Mother    Hypertension Mother    Breast cancer Paternal Grandmother    Cancer Paternal Grandmother    Diabetes type II Maternal Grandmother        deceased 52   Diabetes Maternal Grandmother    Pulmonary embolism Paternal Grandfather    Stroke Maternal Grandfather      HOME MEDICATIONS: Allergies as of 06/28/2020      Reactions   Silver Other (See Comments), Rash   Codeine Nausea And Vomiting   Pt takes Percocet without problems   Darvocet [propoxyphene N-acetaminophen] Nausea And Vomiting   Glipizide    Diarrhea and abdominal pain    Hydrocodone-acetaminophen Nausea And Vomiting   Lantus [insulin Glargine]    Burning at injection site    Hydrocodone-acetaminophen Nausea And Vomiting   Vomiting    Latex Rash   Tape Itching, Rash   Tramadol Nausea Only      Medication List       Accurate as of June 28, 2020  3:22 PM. If you have any questions, ask your nurse or doctor.        STOP taking these medications   Lantus SoloStar 100 UNIT/ML Solostar Pen Generic drug: insulin glargine Stopped by: Dorita Sciara, MD   Trulicity 1.5 TK/1.6WF Sopn Generic drug: Dulaglutide Replaced by: Trulicity 3 UX/3.2TF Sopn Stopped by: Dorita Sciara, MD     TAKE these medications   acetaminophen 500 MG tablet Commonly known as: TYLENOL Take 1,000 mg by mouth every  6 (six) hours as needed for moderate pain.   albuterol 108 (90 Base) MCG/ACT inhaler Commonly known as: VENTOLIN HFA Inhale 2 puffs into the lungs every 6 (six) hours as needed for wheezing or shortness of breath.   amLODipine 5 MG tablet Commonly known as: NORVASC Take 2 tablets (10 mg total) by mouth daily. What changed: when to take this   amoxicillin-clavulanate 875-125 MG tablet Commonly known as: AUGMENTIN Take 1 tablet by mouth 2 (two) times daily.   atorvastatin 10 MG tablet Commonly known as: LIPITOR Take 10 mg by mouth at bedtime.   cetirizine 10 MG tablet Commonly known as: ZYRTEC Take 1 tablet (10 mg total) by mouth daily. What changed: when to take this   chlorthalidone 25 MG tablet Commonly known as: HYGROTON TAKE 2 TABLETS(50 MG) BY MOUTH DAILY What changed: See the new instructions.   EPINEPHrine 0.3 mg/0.3 mL Soaj injection Commonly known as: EPI-PEN Inject 0.3 mg into the muscle as needed for anaphylaxis.   escitalopram 10 MG tablet Commonly known as: LEXAPRO Take 10 mg by mouth at bedtime.   fluticasone 50 MCG/ACT nasal spray Commonly known as: FLONASE Place 1 spray into both nostrils daily as needed (for nasal congestion/sinus infection).   glucose blood test strip 1 each by Other route as needed for other. Use as instructed to test blood sugar  3 times daily E11.65     levETIRAcetam 500 MG tablet Commonly known as: KEPPRA Take 1 tablet (500 mg total) by mouth 2 (two) times daily.   lisinopril 5 MG tablet Commonly known as: ZESTRIL Take 2.5 mg by mouth daily.   montelukast 10 MG tablet Commonly known as: SINGULAIR Take 10 mg by mouth at bedtime. Reported on 02/16/2016   NovoFine Plus 32G X 4 MM Misc Generic drug: Insulin Pen Needle 1 Package by Does not apply route 4 (four) times daily. Use as directed 4 times daily E11.65   ondansetron 4 MG tablet Commonly known as: ZOFRAN Take 4 mg by mouth as needed for nausea/vomiting.   pregabalin 100 MG capsule Commonly known as: LYRICA TAKE 1 CAPSULE(100 MG) BY MOUTH THREE TIMES DAILY What changed: See the new instructions.   rizatriptan 5 MG disintegrating tablet Commonly known as: Maxalt-MLT Take 1 tablet (5 mg total) by mouth as needed for migraine. May repeat in 2 hours if needed   Symbicort 160-4.5 MCG/ACT inhaler Generic drug: budesonide-formoterol Inhale 2 puffs into the lungs 2 (two) times daily.   topiramate 25 MG tablet Commonly known as: TOPAMAX Take 25 mg by mouth at bedtime.   Tyler Aas FlexTouch 100 UNIT/ML FlexTouch Pen Generic drug: insulin degludec Inject 0.18 mLs (18 Units total) into the skin daily. Started by: Dorita Sciara, MD   Trulicity 3 ZY/2.4MG Sopn Generic drug: Dulaglutide Inject 0.5 mLs (3 mg total) as directed once a week. Replaces: Trulicity 1.5 NO/0.3BC Sopn Started by: Dorita Sciara, MD   Xarelto 20 MG Tabs tablet Generic drug: rivaroxaban Take 20 mg by mouth at bedtime.        OBJECTIVE:   Vital Signs: BP (!) 146/84 (BP Location: Left Arm, Patient Position: Sitting, Cuff Size: Normal)    Pulse 90    Ht 5' 1"  (1.549 m)    Wt 158 lb 12.8 oz (72 kg)    LMP 11/06/2012    SpO2 99%    BMI 30.00 kg/m   Wt Readings from Last 3 Encounters:  06/28/20 158 lb 12.8 oz (72 kg)  03/29/20 160 lb  9.6 oz (72.8 kg)  03/23/20 161 lb 3.2 oz (73.1 kg)      Exam: General: Pt appears well and is in NAD  Lungs: Clear with good BS bilat with no rales, rhonchi, or wheezes  Heart: RRR with normal S1 and S2 and no gallops; no murmurs; no rub  Extremities: No pretibial edema.   Neuro: MS is good with appropriate affect, pt is alert and Ox3   DM foot exam: 06/28/2020  The skin of the feet is intact without sores or ulcerations. The pedal pulses are 2+ on right and 2+ on left. The sensation is decreased on the right and normal  on the left  to a screening 5.07, 10 gram monofilament      DATA REVIEWED:  Lab Results  Component Value Date   HGBA1C 12.3 (A) 06/28/2020   HGBA1C 10.9 (A) 03/29/2020   HGBA1C 9.9 (H) 11/09/2019   Lab Results  Component Value Date   MICROALBUR 0.64 07/04/2010   LDLCALC 103 (H) 11/10/2019   CREATININE 0.94 11/13/2019   Lab Results  Component Value Date   MICRALBCREAT 4.7 07/04/2010     Lab Results  Component Value Date   CHOL 163 11/10/2019   HDL 38 (L) 11/10/2019   LDLCALC 103 (H) 11/10/2019   TRIG 108 11/10/2019   CHOLHDL 4.3 11/10/2019        In-Office BG 159 mg/dL  ASSESSMENT / PLAN / RECOMMENDATIONS:   Type 2 Diabetes Mellitus, Poorly controlled, With neuropathic complications - Most recent A1c of 12.3 %. Goal A1c <7.0%.  - Pt continues with poor glycemic control, despite making lifestyle changes per the pt, she has met with RD twice in the past.  - Her In-office Bg was 159 mg/dL , but admits to getting readings in 200's and 300's range at home.  - I have encouraged her to continue glucose checks at home and contact us with BG 's > 250  - Lantus has been burning and would like to change it     MEDICATIONS: Stop antus  Start Tresiba 18 units daily  Increase Trulicity to 3 mg weekly   EDUCATION / INSTRUCTIONS:  BG monitoring instructions: Patient is instructed to check her blood sugars 2 times a day, fasting and bedtime   Call [facility] clinic if: BG persistently >  250  I reviewed the Rule of 15 for the treatment of hypoglycemia in detail with the patient. Literature supplied.    2) Diabetic complications:   Eye: Does not have known diabetic retinopathy.   Neuro/ Feet: Does have known diabetic peripheral neuropathy.  Renal: Patient does not have known baseline CKD. She is on an ACEI/ARB at present.Up to date on  urine albumin/creatinine ratio 03/2019.     F/U in 3 months    Signed electronically by: Mack Guise, MD  [facility] Endocrinology  [facility] [address] [place], [place] [address] Phone: [phone] FAX: [phone]   CC: [address] [place] [address] Phone: [phone]  Fax: [phone]  Return to Endocrinology clinic as below: Future Appointments  Date Time Provider [place]  08/04/2020 11:30 AM Cameron Sprang, MD [facility] None

## 2020-06-28 NOTE — Patient Instructions (Addendum)
-   Stop Lantus  - Start Tresiba at 18 units once daily  - Increase trulicity to 3 mg weekly      - Try and check your sugar before breakfast and bedtime      -HOW TO TREAT LOW BLOOD SUGARS (Blood sugar LESS THAN 70 MG/DL)  Please follow the RULE OF 15 for the treatment of hypoglycemia treatment (when your (blood sugars are less than 70 mg/dL)    STEP 1: Take 15 grams of carbohydrates when your blood sugar is low, which includes:   3-4 GLUCOSE TABS  OR  3-4 OZ OF JUICE OR REGULAR SODA OR  ONE TUBE OF GLUCOSE GEL     STEP 2: RECHECK blood sugar in 15 MINUTES STEP 3: If your blood sugar is still low at the 15 minute recheck --> then, go back to STEP 1 and treat AGAIN with another 15 grams of carbohydrates.

## 2020-07-06 ENCOUNTER — Other Ambulatory Visit: Payer: Self-pay | Admitting: Physician Assistant

## 2020-07-06 ENCOUNTER — Encounter: Payer: Self-pay | Admitting: Physician Assistant

## 2020-07-06 ENCOUNTER — Ambulatory Visit (HOSPITAL_COMMUNITY)
Admission: RE | Admit: 2020-07-06 | Discharge: 2020-07-06 | Disposition: A | Discharge status: Home / Self Care | Payer: Medicare Other | Source: Ambulatory Visit | Attending: Pulmonary Disease | Admitting: Pulmonary Disease

## 2020-07-06 DIAGNOSIS — U071 COVID-19: Secondary | ICD-10-CM

## 2020-07-06 DIAGNOSIS — I10 Essential (primary) hypertension: Secondary | ICD-10-CM

## 2020-07-06 DIAGNOSIS — E6609 Other obesity due to excess calories: Secondary | ICD-10-CM

## 2020-07-06 DIAGNOSIS — I6990 Unspecified sequelae of unspecified cerebrovascular disease: Secondary | ICD-10-CM

## 2020-07-06 DIAGNOSIS — E1140 Type 2 diabetes mellitus with diabetic neuropathy, unspecified: Secondary | ICD-10-CM

## 2020-07-06 DIAGNOSIS — Z23 Encounter for immunization: Secondary | ICD-10-CM | POA: Diagnosis not present

## 2020-07-06 MED ORDER — SODIUM CHLORIDE 0.9 % IV SOLN: INTRAVENOUS | Status: DC | PRN

## 2020-07-06 MED ORDER — ALBUTEROL SULFATE HFA 108 (90 BASE) MCG/ACT IN AERS: 2.0000 | INHALATION_SPRAY | Freq: Once | RESPIRATORY_TRACT | Status: DC | PRN

## 2020-07-06 MED ORDER — FAMOTIDINE IN NACL 20-0.9 MG/50ML-% IV SOLN: 20.0000 mg | Freq: Once | INTRAVENOUS | Status: DC | PRN

## 2020-07-06 MED ORDER — METHYLPREDNISOLONE SODIUM SUCC 125 MG IJ SOLR: 125.0000 mg | Freq: Once | INTRAMUSCULAR | Status: DC | PRN

## 2020-07-06 MED ORDER — SODIUM CHLORIDE 0.9 % IV SOLN
Freq: Once | INTRAVENOUS | Status: AC
  Filled 2020-07-06: qty 5

## 2020-07-06 MED ORDER — EPINEPHRINE 0.3 MG/0.3ML IJ SOAJ: 0.3000 mg | Freq: Once | INTRAMUSCULAR | Status: DC | PRN

## 2020-07-06 MED ORDER — DIPHENHYDRAMINE HCL 50 MG/ML IJ SOLN: 50.0000 mg | Freq: Once | INTRAMUSCULAR | Status: DC | PRN

## 2020-07-06 NOTE — Progress Notes (Signed)
I connected by phone with Makayla Frazier on 07/06/2020 at 10:30 AM to discuss the potential use of a new treatment for mild to moderate COVID-19 viral infection in non-hospitalized patients.  This patient is a 49 y.o. female that meets the FDA criteria for Emergency Use Authorization of COVID monoclonal antibody casirivimab/imdevimab.  Has a (+) direct SARS-CoV-2 viral test result  Has mild or moderate COVID-19   Is NOT hospitalized due to COVID-19  Is within 10 days of symptom onset  Has at least one of the high risk factor(s) for progression to severe COVID-19 and/or hospitalization as defined in EUA.  Specific high risk criteria : BMI > 25, CVD, DMT2, HTN   I have spoken and communicated the following to the patient or parent/caregiver regarding COVID monoclonal antibody treatment:  1. FDA has authorized the emergency use for the treatment of mild to moderate COVID-19 in adults and pediatric patients with positive results of direct SARS-CoV-2 viral testing who are 26 years of age and older weighing at least 40 kg, and who are at high risk for progressing to severe COVID-19 and/or hospitalization.  2. The significant known and potential risks and benefits of COVID monoclonal antibody, and the extent to which such potential risks and benefits are unknown.  3. Information on available alternative treatments and the risks and benefits of those alternatives, including clinical trials.  4. Patients treated with COVID monoclonal antibody should continue to self-isolate and use infection control measures (e.g., wear mask, isolate, social distance, avoid sharing personal items, clean and disinfect "high touch" surfaces, and frequent handwashing) according to CDC guidelines.   5. The patient or parent/caregiver has the option to accept or refuse COVID monoclonal antibody treatment.  After reviewing this information with the patient, The patient agreed to proceed with receiving  casirivimab\imdevimab infusion and will be provided a copy of the Fact sheet prior to receiving the infusion.   Sx onset 7/20. Set up for infusion today at 12:30pm. Directions given. Discussed infusion charge with pt.   Angelena Form 07/06/2020 10:30 AM

## 2020-07-06 NOTE — Progress Notes (Signed)
  Diagnosis: COVID-19  Physician: Dr. Patrick Wright  Procedure: Covid Infusion Clinic Med: casirivimab\imdevimab infusion - Provided patient with casirivimab\imdevimab fact sheet for patients, parents and caregivers prior to infusion.  Complications: No immediate complications noted.  Discharge: Discharged home   Rachele Lamaster G 07/06/2020  

## 2020-07-06 NOTE — Discharge Instructions (Signed)

## 2020-07-09 ENCOUNTER — Emergency Department (HOSPITAL_COMMUNITY)
Admission: EM | Admit: 2020-07-09 | Discharge: 2020-07-09 | Disposition: A | Discharge status: Home / Self Care | Payer: Medicare Other | Attending: Emergency Medicine | Admitting: Emergency Medicine

## 2020-07-09 ENCOUNTER — Emergency Department (HOSPITAL_COMMUNITY): Payer: Medicare Other

## 2020-07-09 ENCOUNTER — Encounter (HOSPITAL_COMMUNITY): Payer: Self-pay | Admitting: Emergency Medicine

## 2020-07-09 ENCOUNTER — Other Ambulatory Visit: Payer: Self-pay

## 2020-07-09 DIAGNOSIS — J45909 Unspecified asthma, uncomplicated: Secondary | ICD-10-CM | POA: Insufficient documentation

## 2020-07-09 DIAGNOSIS — E1140 Type 2 diabetes mellitus with diabetic neuropathy, unspecified: Secondary | ICD-10-CM | POA: Insufficient documentation

## 2020-07-09 DIAGNOSIS — R0602 Shortness of breath: Secondary | ICD-10-CM | POA: Diagnosis present

## 2020-07-09 DIAGNOSIS — U071 COVID-19: Secondary | ICD-10-CM | POA: Diagnosis not present

## 2020-07-09 DIAGNOSIS — E039 Hypothyroidism, unspecified: Secondary | ICD-10-CM | POA: Diagnosis not present

## 2020-07-09 DIAGNOSIS — I10 Essential (primary) hypertension: Secondary | ICD-10-CM | POA: Insufficient documentation

## 2020-07-09 LAB — COMPREHENSIVE METABOLIC PANEL
ALT: 34 U/L (ref 0–44)
AST: 39 U/L (ref 15–41)
Albumin: 3.1 g/dL — ABNORMAL LOW (ref 3.5–5.0)
Alkaline Phosphatase: 39 U/L (ref 38–126)
Anion gap: 16 — ABNORMAL HIGH (ref 5–15)
BUN: 19 mg/dL (ref 6–20)
CO2: 28 mmol/L (ref 22–32)
Calcium: 8.1 mg/dL — ABNORMAL LOW (ref 8.9–10.3)
Chloride: 89 mmol/L — ABNORMAL LOW (ref 98–111)
Creatinine, Ser: 0.94 mg/dL (ref 0.44–1.00)
GFR calc Af Amer: >60 mL/min (ref >60)
GFR calc non Af Amer: >60 mL/min (ref >60)
Glucose, Bld: 337 mg/dL — ABNORMAL HIGH (ref 70–99)
Potassium: 3.7 mmol/L (ref 3.5–5.1)
Sodium: 133 mmol/L — ABNORMAL LOW (ref 135–145)
Total Bilirubin: 1 mg/dL (ref 0.3–1.2)
Total Protein: 7.1 g/dL (ref 6.5–8.1)

## 2020-07-09 LAB — I-STAT BETA HCG BLOOD, ED (MC, WL, AP ONLY): I-stat hCG, quantitative: <5 m[IU]/mL (ref <5)

## 2020-07-09 LAB — CBC
HCT: 42.3 % (ref 36.0–46.0)
Hemoglobin: 13.6 g/dL (ref 12.0–15.0)
MCH: 25.4 pg — ABNORMAL LOW (ref 26.0–34.0)
MCHC: 32.2 g/dL (ref 30.0–36.0)
MCV: 78.9 fL — ABNORMAL LOW (ref 80.0–100.0)
Platelets: 283 10*3/uL (ref 150–400)
RBC: 5.36 MIL/uL — ABNORMAL HIGH (ref 3.87–5.11)
RDW: 14.2 % (ref 11.5–15.5)
WBC: 7.8 10*3/uL (ref 4.0–10.5)
nRBC: 0 % (ref 0.0–0.2)

## 2020-07-09 LAB — TROPONIN I (HIGH SENSITIVITY)
Troponin I (High Sensitivity): 7 ng/L (ref <18)
Troponin I (High Sensitivity): 9 ng/L (ref <18)

## 2020-07-09 MED ORDER — SODIUM CHLORIDE (PF) 0.9 % IJ SOLN
INTRAMUSCULAR | Status: AC
  Administered 2020-07-09: 10 mL
  Filled 2020-07-09: qty 50

## 2020-07-09 MED ORDER — ONDANSETRON 4 MG PO TBDP
4.0000 mg | ORAL_TABLET | Freq: Three times a day (TID) | ORAL | 0 refills | Status: DC | PRN
Start: 2020-07-09 — End: 2024-07-27

## 2020-07-09 MED ORDER — KETOROLAC TROMETHAMINE 15 MG/ML IJ SOLN
15.0000 mg | Freq: Once | INTRAMUSCULAR | Status: AC
  Administered 2020-07-09: 15 mg via INTRAVENOUS
  Filled 2020-07-09: qty 1

## 2020-07-09 MED ORDER — IOHEXOL 350 MG/ML SOLN
80.0000 mL | Freq: Once | INTRAVENOUS | Status: AC | PRN
  Administered 2020-07-09: 80 mL via INTRAVENOUS

## 2020-07-09 NOTE — ED Provider Notes (Signed)
[facility] Emergency Department Provider Note MRN:  [id number]  Arrival date & time: 07/09/20     Chief Complaint   Shortness of breath History of Present Illness   Makayla Frazier is a 49 y.o. year-old female with a history of diabetes, stroke, pulmonary believes him presenting to the ED with chief complaint of shortness of breath.  Patient began having viral symptoms 9 days ago, has tested positive for COVID-19.  Explains that she received antibody therapy 3 days ago.  She began feeling worse yesterday with worsening shortness of breath, worsening cough, and also endorsing 9 out of 10 central chest pain.  She explains that she has felt too unwell with nausea and cough to take any of her home medications.  Has a history of pulmonary embolism and has not taken her Xarelto in 4 days.  Symptoms constant, no exacerbating or relieving factors.  Review of Systems  A complete 10 system review of systems was obtained and all systems are negative except as noted in the HPI and PMH.   Patient's Health History    Past Medical History:  Diagnosis Date  . Anemia   . Arthritis    Left leg  . Asthma   . BV (bacterial vaginosis) 2012  . Complication of anesthesia    Seizures in PACU after surgery 3/15  . DM (diabetes mellitus) ([place])    diagnosed 2009  . DVT (deep venous thrombosis) ([place]) 2011   pt had IUD; also 05/2012  . Dysfunctional uterine bleeding    Seen by Dr. Leo Grosser, GYN; pending hysterectomy as of 07/2012  . H/O hypercoagulable state    2/2 contraception  . Headache(784.0)    migraines  . Herpes simplex without mention of complication 02/3294   HSV-2  . HLD (hyperlipidemia)   . Hypertension   . Labial lesion 2013  . Major depression    Hx suicide attempt in 2009, [facility] admissions  . Mastodynia   . Neuromuscular disorder (HCC)    neuropathy  . Neuropathy   . Obesity   . Oligomenorrhea 2011  . PE (pulmonary embolism)    2009 - on oral contraception;  multiple  . Post - coital bleeding 2012  . Seizures ([place]) 2015   last episode May 2015  . Sleep apnea   . Status post placement of implantable loop recorder   . Stroke [facility]    TIA 2013  . Syncopal episodes    unknown etiology  . Thyroid disease    hypothyroidism (pt denies)  . Vaginal Pap smear, abnormal   . Venous insufficiency   . Vitamin D deficiency    unknown to pt.    Past Surgical History:  Procedure Laterality Date  . BILATERAL SALPINGECTOMY  12/18/2012   Procedure: BILATERAL SALPINGECTOMY;  Surgeon: Eldred Manges, MD;  Location: [facility];  Service: Gynecology;  Laterality: Bilateral;  . BLADDER SUSPENSION N/A 02/13/2014   Procedure: TRANSVAGINAL TAPE (TVT) PROCEDURE;  Surgeon: Delice Lesch, MD;  Location: [facility];  Service: Gynecology;  Laterality: N/A;  . BREAST REDUCTION SURGERY     Dr. Eugene Garnet [facility] 2011  . CARDIAC ELECTROPHYSIOLOGY STUDY & DFT     Implantable Loop recorder; no arrhythmias associated with synocpal spells; loop explanted 07-2013  . CHOLECYSTECTOMY    . DILATATION & CURRETTAGE/HYSTEROSCOPY WITH RESECTOCOPE  2007 & 2013  . ENDOMETRIAL BIOPSY  2011  . IUD REMOVAL  12/18/2012   Procedure: INTRAUTERINE DEVICE (IUD) REMOVAL;  Surgeon: Eldred Manges, MD;  Location: [facility];  Service: Gynecology;  Laterality: N/A;  Removed during prep by E. Florene Glen PA  . LAPAROSCOPIC ASSISTED VAGINAL HYSTERECTOMY  12/18/2012   Procedure: LAPAROSCOPIC ASSISTED VAGINAL HYSTERECTOMY;  Surgeon: Eldred Manges, MD;  Location: [facility];  Service: Gynecology;  Laterality: N/A;  . LOOP RECORDER EXPLANT N/A 07/17/2013   Procedure: LOOP RECORDER EXPLANT;  Surgeon: Thompson Grayer, MD;  Location: [facility] CATH LAB;  Service: Cardiovascular;  Laterality: N/A;  . TRANSVAGINAL TAPE (TVT) REMOVAL N/A 04/28/2014   Procedure: Removal of suburethral mesh;  Surgeon: Delice Lesch, MD;  Location: [facility];  Service: Gynecology;  Laterality: N/A;  . WISDOM TOOTH EXTRACTION      Family History  Problem  Relation Age of Onset  . Melanoma Father 66  . Diabetes Father   . Hypertension Father   . Kidney disease Father   . Ulcerative colitis Mother 61  . Diabetes Mother   . Hypertension Mother   . Breast cancer Paternal Grandmother   . Cancer Paternal Grandmother   . Diabetes type II Maternal Grandmother        deceased 63  . Diabetes Maternal Grandmother   . Pulmonary embolism Paternal Grandfather   . Stroke Maternal Grandfather     Social History   Socioeconomic History  . Marital status: Married    Spouse name: Not on file  . Number of children: Not on file  . Years of education: Not on file  . Highest education level: Not on file  Occupational History  . Occupation: International aid/development worker: UNEMPLOYED    Comment: Disabled [id number] due to PE complications  Tobacco Use  . Smoking status: Never Smoker  . Smokeless tobacco: Never Used  Vaping Use  . Vaping Use: Never used  Substance and Sexual Activity  . Alcohol use: Yes    Comment: once in a while   . Drug use: No  . Sexual activity: Yes    Partners: Male    Birth control/protection: Surgical  Other Topics Concern  . Not on file  Social History Narrative   Lives in [place]   Lives with significant other, Gwyndolyn Saxon   Completed 10th grade.   Worker Compensation Case 2009-2012 related to PE         Epworth Sleepiness Scale      Total score:  13      --I have high BP   --I have had a stroke   --I have had insomnia   --I have been told that I stop breathing during sleep   --I wake up to urinate frequently at night   --I wake up with a dry mouth and a sore throat   --I have Diabetes   --I have been told that I snore   --I am overweight or am gaining weight   --I have problems with memory/concentration   --I awake feeling not rested   --I frequently awake with headaches   --I have dozed off or fallen asleep at inappropriate times during the day   Social Determinants of Health   Financial Resource Strain:   .  Difficulty of Paying Living Expenses:   Food Insecurity:   . Worried About Charity fundraiser in the Last Year:   . Arboriculturist in the Last Year:   Transportation Needs:   . Film/video editor (Medical):   Marland Kitchen Lack of Transportation (Non-Medical):   Physical Activity:   . Days of Exercise per Week:   .  Minutes of Exercise per Session:   Stress:   . Feeling of Stress :   Social Connections:   . Frequency of Communication with Friends and Family:   . Frequency of Social Gatherings with Friends and Family:   . Attends Religious Services:   . Active Member of Clubs or Organizations:   . Attends Archivist Meetings:   Marland Kitchen Marital Status:   Intimate Partner Violence:   . Fear of Current or Ex-Partner:   . Emotionally Abused:   Marland Kitchen Physically Abused:   . Sexually Abused:      Physical Exam   Vitals:   07/09/20 1101 07/09/20 1324  BP: (!) 122/87 (!) 122/87  Pulse: 91 91  Resp: 20 20  Temp: 98.2 F (36.8 C) 98.2 F (36.8 C)  SpO2: 95% 95%    CONSTITUTIONAL: Well-appearing, NAD, appears tired NEURO:  Alert and oriented x 3, no focal deficits EYES:  eyes equal and reactive ENT/NECK:  no LAD, no JVD CARDIO: Regular rate, well-perfused, normal S1 and S2 PULM:  CTAB no wheezing or rhonchi GI/GU:  normal bowel sounds, non-distended, non-tender MSK/SPINE:  No gross deformities, no edema SKIN:  no rash, atraumatic PSYCH:  Appropriate speech and behavior  *Additional and/or pertinent findings included in MDM below  Diagnostic and Interventional Summary    EKG Interpretation  Date/Time:  Friday July 09 2020 09:13:06 EDT Ventricular Rate:  91 PR Interval:    QRS Duration: 83 QT Interval:  380 QTC Calculation: 468 R Axis:   32 Text Interpretation: Sinus rhythm Borderline T wave abnormalities Confirmed by Gerlene Fee [id number]) on 07/09/2020 1:23:56 PM      Labs Reviewed  CBC - Abnormal; Notable for the following components:      Result Value   RBC 5.36 (*)      MCV 78.9 (*)    MCH 25.4 (*)    All other components within normal limits  COMPREHENSIVE METABOLIC PANEL - Abnormal; Notable for the following components:   Sodium 133 (*)    Chloride 89 (*)    Glucose, Bld 337 (*)    Calcium 8.1 (*)    Albumin 3.1 (*)    Anion gap 16 (*)    All other components within normal limits  I-STAT BETA HCG BLOOD, ED ([facility], [facility], [facility] ONLY)  TROPONIN I (HIGH SENSITIVITY)  TROPONIN I (HIGH SENSITIVITY)    CT ANGIO CHEST PE W OR WO CONTRAST    (Results Pending)    Medications  ketorolac (TORADOL) 15 MG/ML injection 15 mg (15 mg Intravenous Given 07/09/20 0913)  sodium chloride (PF) 0.9 % injection (10 mLs  Given 07/09/20 1156)  iohexol (OMNIPAQUE) 350 MG/ML injection 80 mL (80 mLs Intravenous Contrast Given 07/09/20 1103)     Procedures  /  Critical Care Procedures  ED Course and Medical Decision Making  I have reviewed the triage vital signs, the nursing notes, and pertinent available records from the EMR.  Listed above are laboratory and imaging tests that I personally ordered, reviewed, and interpreted and then considered in my medical decision making (see below for details).      Concern for pulmonary embolism given patient's history of PE, noncompliance with her home anticoagulation, and her active COVID-19 infection.  Will need CT to exclude.  Overall her oxygen saturation is 95% in the room, she does desaturate minimally with coughing fits but then quickly recovers.  With a negative work-up I feel she can be discharged.  Work-up is reassuring, troponin negative  x2, CT negative for PE.  Patient continues to have reassuring oxygen saturations, in no acute distress, appropriate for discharge.  Strongly encouraged compliance with her home medications.  Barth Kirks. Sedonia Small, [place] [email or web address]  Final Clinical Impressions(s) / ED Diagnoses     ICD-10-CM   1. COVID-19  U07.1     ED Discharge  Orders         Ordered    ondansetron (ZOFRAN ODT) 4 MG disintegrating tablet  Every 8 hours PRN     Discontinue  Reprint     07/09/20 1516           Discharge Instructions Discussed with and Provided to Patient:     Discharge Instructions     You were evaluated in the Emergency Department and after careful evaluation, we did not find any emergent condition requiring admission or further testing in the hospital.  Your exam/testing today was overall reassuring.  Your symptoms seem to be due to COVID-19.  We suspect that you will start feeling better.  It is very important that you take your home medications.  Please use the Zofran antinausea medication to help you take your medications.  Please continue home isolation.  Please return to the Emergency Department if you experience any worsening of your condition.  Thank you for allowing [place] to be a part of your care.        Maudie Flakes, MD 07/09/20 1520

## 2020-07-09 NOTE — ED Triage Notes (Addendum)
Pt BIBA from home.   Per EMS- Pt recently dx with Covid 19 on 06/30/20; c/o covid sx (cough, weakness, SHOB). AOx4, ambulatory.   20 g L AC 97.5 temporal 93 HR 124/81 95% on room air.  CBG 384  NS 250 mL given by EMS

## 2020-07-09 NOTE — Discharge Instructions (Signed)
You were evaluated in the Emergency Department and after careful evaluation, we did not find any emergent condition requiring admission or further testing in the hospital.  Your exam/testing today was overall reassuring.  Your symptoms seem to be due to COVID-19.  We suspect that you will start feeling better.  It is very important that you take your home medications.  Please use the Zofran antinausea medication to help you take your medications.  Please continue home isolation.  Please return to the Emergency Department if you experience any worsening of your condition.  Thank you for allowing [place] to be a part of your care.

## 2020-07-09 NOTE — ED Notes (Signed)
Husband has asked for an update on his wife.   [phone]

## 2020-08-04 ENCOUNTER — Other Ambulatory Visit: Payer: Self-pay

## 2020-08-04 ENCOUNTER — Encounter: Payer: Self-pay | Admitting: Neurology

## 2020-08-04 ENCOUNTER — Telehealth (INDEPENDENT_AMBULATORY_CARE_PROVIDER_SITE_OTHER): Payer: Medicare Other | Admitting: Neurology

## 2020-08-04 VITALS — Wt 147.0 lb

## 2020-08-04 DIAGNOSIS — G40009 Localization-related (focal) (partial) idiopathic epilepsy and epileptic syndromes with seizures of localized onset, not intractable, without status epilepticus: Secondary | ICD-10-CM | POA: Diagnosis not present

## 2020-08-04 DIAGNOSIS — G43009 Migraine without aura, not intractable, without status migrainosus: Secondary | ICD-10-CM

## 2020-08-04 NOTE — Progress Notes (Signed)
Virtual Visit via Video Note The purpose of this virtual visit is to provide medical care while limiting exposure to the novel coronavirus.    Consent was obtained for video visit:  Yes.   Answered questions that patient had about telehealth interaction:  Yes.   I discussed the limitations, risks, security and privacy concerns of performing an evaluation and management service by telemedicine. I also discussed with the patient that there may be a patient responsible charge related to this service. The patient expressed understanding and agreed to proceed.  Pt location: Home Physician Location: office Name of referring provider:  Scifres, Dorothy, PA-C I connected with Makayla Frazier at patients initiation/request on 08/04/2020 at 11:30 AM EDT by video enabled telemedicine application and verified that I am speaking with the correct person using two identifiers. Pt MRN:  [id number] Pt DOB:  1971/09/26 Video Participants:  Makayla Frazier   History of Present Illness:  The patient had a virtual video visit on 08/04/2020. She was last seen in the neurology clinic 4 months ago for recurrent seizures. MRI brain and routine EEG unremarkable. She is on Levetiracetam 500mg  BID. She is also on Topiramate 25mg  qhs for migraine prophylaxis and Pregabalin 100mg  TID for neuropathy. On her last visit, her husband continued to report nocturnal shaking episodes and an ambulatory EEG was recommended but not yet done. She had COVID last month and was in the ER on 7/30 reporting worsening shortness of breath, cough, nausea, unable to take her home medications. CT chest negative for PE. Since then, she has been unable to take the Levetiracetam because it seemed as if her body began reacting by making her sick and nauseated. She has been off medication for a month, she is taking all her other medications. She is starting to get some taste and smell back. Her husband has not mentioned any nocturnal shaking episodes  recently. She denies any daytime episodes of loss of consciousness, no falls. She denies any myoclonic jerks. She has numbness and tingling due to neuropathy. Migraines come and go, they have not worsened since last visit.    History on Initial Assessment 11/20/2019: This is a 49 year old right-handed woman with a history of hypertension, hyperlipidemia, thyroid disease, depression, PE on Xarelto, presenting for evaluation of seizures. Records from her prior neurologist were reviewed, she was last seen at [facility] in 2016. Per records she started having seizures in 2009, although she recalled passing out spells when younger. Seizures were described as feeling hot then passing at, at times with shaking. She has a bad headache after. She had a cardiology evaluation with a loop recorder reportedly normal. She was started on Topiramate at one point, however with abrupt increase from 25mg  BID to 100mg  BID, she had significant cognitive side effects. She has been on Topiramate 25mg  at bedtime for several years for migraine prophylaxis. She reports her last seizure was in December 2015 when she had tremors in her sleep (not convulsions). There is a normal EEG report from 2015. She continued to report nocturnal events and was started on Levetiracetam 500mg  BID. Since starting Levetiracetam, the nocturnal shaking episodes decreased, and she did not have any daytime episodes until she had an episode of shaking with incontinence 7 months ago, then she was brought to [facility] after an episode of loss of consciousness on 11/08/2019. She recalls feeling a pull on her chest, then woke up on the ground. No tongue bite or incontinence, her husband did not witness  any shaking. She was a little out of it after with a slight headache, her right arm and leg were weaker and numb, she was dragging her leg like it was deadweight. It took 1-2 days to feel normal. I personally reviewed MRI brain without contrast which did not show any  acute changes, hippocampi symmetric, partially empty sella. Her wake and sleep EEG was within normal limits. She reported that she had cut down Levetiracetam to 500mg  at bedtime a year ago due to drowsiness and nausea, however since hospital discharge she is back to taking 500mg  BID with nausea medication. Her husband has mentioned episodes where she would stare off or get a little confused, none recently. She has occasional mild tremors in both hands. She denies any olfactory/gustatory hallucinations. She reports similar transient right-sided weakness after a spell last year. She has been doing PT and was told by PT yesterday to use a walker. She denies any neck/back pain.  She has occasional migraines with good response to low dose Topiramate 25mg  at bedtime. When off medications, she gets very photosensitive. Last migraine was last summer. She takes Maxalt prn. She has been on Pregabalin 100mg  TID for neuropathy over the past year, which helps with numbness/tingling in both feet. In the hospital, she had urinary retention, MRI thoracic and lumbar spine were unremarkable, urinary retention has resolved. She has a lot of constipation. She reports a sleep study where she was told she "stopped breathing 10x/hr but did not start CPAP." Her last sleep study in 2015 reportedly showed OSA. She recalls having a prolonged EEG in the past but with no results due to technical difficulties.   Epilepsy Risk Factors:  Maternal grandfather and some cousins had seizures. Otherwise she had a normal birth and early development.  There is no history of febrile convulsions, CNS infections such as meningitis/encephalitis, significant traumatic brain injury, neurosurgical procedures.    Current Outpatient Medications on File Prior to Visit  Medication Sig Dispense Refill  . acetaminophen (TYLENOL) 500 MG tablet Take 1,000 mg by mouth every 6 (six) hours as needed for moderate pain.     Marland Kitchen albuterol (PROVENTIL HFA;VENTOLIN HFA)  108 (90 BASE) MCG/ACT inhaler Inhale 2 puffs into the lungs every 6 (six) hours as needed for wheezing or shortness of breath.    Marland Kitchen amLODipine (NORVASC) 5 MG tablet Take 2 tablets (10 mg total) by mouth daily. (Patient taking differently: Take 10 mg by mouth at bedtime. ) 90 tablet 3  . atorvastatin (LIPITOR) 10 MG tablet Take 10 mg by mouth at bedtime.    . cetirizine (ZYRTEC) 10 MG tablet Take 1 tablet (10 mg total) by mouth daily. (Patient taking differently: Take 10 mg by mouth at bedtime. ) 90 tablet 3  . chlorpheniramine-HYDROcodone (TUSSIONEX) 10-8 MG/5ML SUER Take 5 mLs by mouth 2 (two) times daily as needed for cough.    . chlorthalidone (HYGROTON) 25 MG tablet TAKE 2 TABLETS(50 MG) BY MOUTH DAILY (Patient taking differently: Take 50 mg by mouth at bedtime. ) 180 tablet 0  . EPINEPHrine 0.3 mg/0.3 mL IJ SOAJ injection Inject 0.3 mg into the muscle as needed for anaphylaxis.     Marland Kitchen escitalopram (LEXAPRO) 10 MG tablet Take 10 mg by mouth at bedtime.     . fluticasone (FLONASE) 50 MCG/ACT nasal spray Place 1 spray into both nostrils daily as needed (for nasal congestion/sinus infection).     Marland Kitchen glucose blood test strip 1 each by Other route as needed for other. Use  as instructed to test blood sugar  3 times daily E11.65 100 each 11  . insulin degludec (TRESIBA FLEXTOUCH) 100 UNIT/ML FlexTouch Pen Inject 0.18 mLs (18 Units total) into the skin daily. 15 mL 6  . Insulin Pen Needle (NOVOFINE PLUS) 32G X 4 MM MISC 1 Package by Does not apply route 4 (four) times daily. Use as directed 4 times daily E11.65 200 each 6  . ipratropium (ATROVENT) 0.06 % nasal spray Place 2 sprays into both nostrils 4 (four) times daily.    Marland Kitchen lisinopril (ZESTRIL) 5 MG tablet Take 2.5 mg by mouth daily.    . montelukast (SINGULAIR) 10 MG tablet Take 10 mg by mouth at bedtime. Reported on 02/16/2016    . ondansetron (ZOFRAN ODT) 4 MG disintegrating tablet Take 1 tablet (4 mg total) by mouth every 8 (eight) hours as needed for  nausea or vomiting. 20 tablet 0  . ondansetron (ZOFRAN) 4 MG tablet Take 4 mg by mouth as needed for nausea/vomiting.    . pregabalin (LYRICA) 100 MG capsule TAKE 1 CAPSULE(100 MG) BY MOUTH THREE TIMES DAILY (Patient taking differently: Take 100 mg by mouth 3 (three) times daily. ) 90 capsule 0  . rivaroxaban (XARELTO) 20 MG TABS tablet Take 20 mg by mouth at bedtime.     . rizatriptan (MAXALT-MLT) 5 MG disintegrating tablet Take 1 tablet (5 mg total) by mouth as needed for migraine. May repeat in 2 hours if needed 15 tablet 12  . SYMBICORT 160-4.5 MCG/ACT inhaler Inhale 2 puffs into the lungs 2 (two) times daily.    Marland Kitchen topiramate (TOPAMAX) 25 MG tablet Take 25 mg by mouth at bedtime.     . TRULICITY 6.23 JS/2.8BT SOPN Inject 0.75 mg into the skin once a week.    . levETIRAcetam (KEPPRA) 500 MG tablet Take 1 tablet (500 mg total) by mouth 2 (two) times daily. 180 tablet 3   No current facility-administered medications on file prior to visit.     Observations/Objective:   Vitals:   08/04/20 0953  Weight: 147 lb (66.7 kg)   GEN:  The patient appears stated age and is in NAD, lying in bed.  Neurological examination: Patient is awake, alert, oriented x 3. No aphasia or dysarthria. Intact fluency and comprehension. Remote and recent memory intact. Cranial nerves: Extraocular movements intact with no nystagmus. No facial asymmetry. Motor: moves all extremities symmetrically, at least anti-gravity x 4.   Assessment and Plan:   This is a 49 yo RH woman with a history of hypertension, hyperlipidemia, thyroid disease, depression, PE on Xarelto, migraines, neuropathy, with recurrent seizures. She reports a history of nocturnal shaking episodes, as well as episodes of loss of consciousness with post-event right-sided weakness suggestive of focal to bilateral tonic-clonic seizures arising from the left hemisphere. MRI brain and routine EEG unremarkable. She denied any further episodes of loss of  consciousness since 10/2019. Her husband had not mentioned nocturnal shaking episodes recently, however she has been sick with Covid and stopped Levetiracetam due to intolerable reaction since she got sick. She is on Topiramate 25mg  qhs for migraine prophylaxis with good effect, and Pregabalin 100mg  TID for neuropathy. We discussed keeping a seizure calendar, and if her husband notes increasing nocturnal shaking episodes, we will increase Topiramate dose. She will call our office for refills. Consider prolonged EEG in the future if episodes continue on higher dose of Topiramate. She is aware of [place] driving laws to stop driving after a seizure until 6 months seizure-free.  Follow-up in 4-5 months, she knows to call for any changes.    Follow Up Instructions:   -I discussed the assessment and treatment plan with the patient. The patient was provided an opportunity to ask questions and all were answered. The patient agreed with the plan and demonstrated an understanding of the instructions.   The patient was advised to call back or seek an in-person evaluation if the symptoms worsen or if the condition fails to improve as anticipated.    Cameron Sprang, MD  CC: Maude Leriche, PA-C

## 2020-09-17 ENCOUNTER — Telehealth: Payer: Self-pay | Admitting: Internal Medicine

## 2020-09-17 NOTE — Telephone Encounter (Signed)
Nurse from [employer] called to advise that she visited with patient today and that her A1C was 12.1

## 2020-09-24 ENCOUNTER — Telehealth: Payer: Self-pay | Admitting: Neurology

## 2020-09-24 ENCOUNTER — Other Ambulatory Visit: Payer: Self-pay

## 2020-09-24 MED ORDER — TOPIRAMATE 25 MG PO TABS: 25.0000 mg | ORAL_TABLET | Freq: Every day | ORAL | 0 refills | Status: DC

## 2020-09-24 NOTE — Telephone Encounter (Signed)
Patient needs refill of Topiramate sent to [facility] on [address].

## 2020-10-01 ENCOUNTER — Ambulatory Visit: Payer: Medicare Other | Admitting: Internal Medicine

## 2020-10-14 ENCOUNTER — Ambulatory Visit (INDEPENDENT_AMBULATORY_CARE_PROVIDER_SITE_OTHER): Payer: Medicare Other

## 2020-10-14 ENCOUNTER — Ambulatory Visit (INDEPENDENT_AMBULATORY_CARE_PROVIDER_SITE_OTHER): Payer: Medicare Other | Admitting: Sports Medicine

## 2020-10-14 ENCOUNTER — Other Ambulatory Visit: Payer: Self-pay | Admitting: Sports Medicine

## 2020-10-14 ENCOUNTER — Other Ambulatory Visit: Payer: Self-pay

## 2020-10-14 ENCOUNTER — Encounter: Payer: Self-pay | Admitting: Sports Medicine

## 2020-10-14 DIAGNOSIS — L603 Nail dystrophy: Secondary | ICD-10-CM | POA: Diagnosis not present

## 2020-10-14 DIAGNOSIS — M79671 Pain in right foot: Secondary | ICD-10-CM

## 2020-10-14 DIAGNOSIS — M21611 Bunion of right foot: Secondary | ICD-10-CM | POA: Diagnosis not present

## 2020-10-14 DIAGNOSIS — M79672 Pain in left foot: Secondary | ICD-10-CM | POA: Diagnosis not present

## 2020-10-14 DIAGNOSIS — E1165 Type 2 diabetes mellitus with hyperglycemia: Secondary | ICD-10-CM | POA: Diagnosis not present

## 2020-10-14 DIAGNOSIS — M21619 Bunion of unspecified foot: Secondary | ICD-10-CM

## 2020-10-14 DIAGNOSIS — M21612 Bunion of left foot: Secondary | ICD-10-CM

## 2020-10-14 DIAGNOSIS — B009 Herpesviral infection, unspecified: Secondary | ICD-10-CM | POA: Insufficient documentation

## 2020-10-14 NOTE — Progress Notes (Signed)
Subjective: Makayla Frazier is a 49 y.o. female patient with history of diabetes who presents to office today complaining of long,mildly painful nails  while ambulating in shoes; unable to trim. Patient states that the glucose reading this morning was elevated and her last A1c was 12 reports that she is still recovering from having Covid.  Patient also reports that she has some pain at her bunion areas.  No other pedal complaints noted.  Review of system noncontributory.  Patient Active Problem List   Diagnosis Date Noted   Herpes simplex type 2 infection 10/14/2020   Type 2 diabetes mellitus with hyperglycemia, without long-term current use of insulin ([place]) 12/25/2019   Type 2 diabetes mellitus with diabetic neuropathy, with long-term current use of insulin (HCC) 12/25/2019   Transient loss of consciousness 11/09/2019   Weakness of right leg 11/09/2019   Multiple idiopathic pulmonary cysts 11/09/2019   Hypertensive urgency 11/09/2019   Hyperlipidemia associated with type 2 diabetes mellitus ([place]) 06/27/2018   GAD (generalized anxiety disorder) 08/21/2016   Moderate single current episode of major depressive disorder ([place]) 08/21/2016   Reaction to severe stress 03/07/2016   Stress 03/07/2016   Bleeding per rectum 11/29/2015   Acute pansinusitis 10/20/2015   Candida vaginitis 10/20/2015   Cerebrovascular accident, late effects 08/30/2015   D (diarrhea) 08/02/2015   Loose bowel movement 08/02/2015   Seizure disorder ([place]) 07/13/2015   Adnexal pain 06/08/2015   Right flank pain 05/30/2015   Chronic pulmonary embolism ([place]) 05/14/2015   Long term current use of anticoagulant 05/14/2015   Seizures ([place]) 05/07/2014   Absence of bladder continence 03/27/2014   Arthropathia 02/25/2014   Chronic chest pain 02/25/2014   Healed or old pulmonary embolism 02/25/2014   HLD (hyperlipidemia) 02/25/2014   Headache, migraine 02/25/2014   Obstructive apnea 02/25/2014    Uncontrolled diabetes mellitus with hyperglycemia ([place]) 02/25/2014   Incontinence 02/13/2014   S/P vaginal hysterectomy 01/29/2013   TIA (transient ischemic attack) 08/03/2012   Left-sided weakness 08/03/2012   Hypokalemia 08/03/2012   Chest pain 08/03/2012   Temporary cerebral vascular dysfunction 08/03/2012   History of pulmonary embolism 05/18/2012   Symptomatic mammary hypertrophy 10/12/2011   Patellar tendinitis of left knee 08/21/2011   Knee pain, left 07/07/2011   PRURITUS 07/04/2010   Syncope and collapse 10/18/2009   Allergic rhinitis 08/11/2009   Unspecified vitamin D deficiency 07/21/2009   Adiposity 07/19/2009   Apnea, sleep 07/19/2009   ANEMIA 07/10/2008   Essential hypertension 07/08/2008   Insulin dependent diabetes mellitus with complications ([place]) 52/84/1324   Peripheral neuropathy 01/22/2008   Asthma 01/22/2008   Mononeuritis 01/22/2008   Current Outpatient Medications on File Prior to Visit  Medication Sig Dispense Refill   acetaminophen (TYLENOL) 500 MG tablet Take 1,000 mg by mouth every 6 (six) hours as needed for moderate pain.      albuterol (PROVENTIL HFA;VENTOLIN HFA) 108 (90 BASE) MCG/ACT inhaler Inhale 2 puffs into the lungs every 6 (six) hours as needed for wheezing or shortness of breath.     amLODipine (NORVASC) 5 MG tablet Take 2 tablets (10 mg total) by mouth daily. (Patient taking differently: Take 10 mg by mouth at bedtime. ) 90 tablet 3   atorvastatin (LIPITOR) 10 MG tablet Take 10 mg by mouth at bedtime.     azithromycin (ZITHROMAX) 250 MG tablet Take by mouth.     cetirizine (ZYRTEC) 10 MG tablet Take 1 tablet (10 mg total) by mouth daily. (Patient taking differently: Take 10 mg by  mouth at bedtime. ) 90 tablet 3   chlorthalidone (HYGROTON) 25 MG tablet TAKE 2 TABLETS(50 MG) BY MOUTH DAILY (Patient taking differently: Take 50 mg by mouth at bedtime. ) 180 tablet 0   EPINEPHrine 0.3 mg/0.3 mL IJ SOAJ injection  Inject 0.3 mg into the muscle as needed for anaphylaxis.      fluconazole (DIFLUCAN) 150 MG tablet Take 150 mg by mouth every 3 (three) days.     fluticasone (FLONASE) 50 MCG/ACT nasal spray Place 1 spray into both nostrils daily as needed (for nasal congestion/sinus infection).      glipiZIDE (GLUCOTROL) 5 MG tablet Take 5 mg by mouth 2 (two) times daily.     glucose blood test strip 1 each by Other route as needed for other. Use as instructed to test blood sugar  3 times daily E11.65 100 each 11   insulin degludec (TRESIBA FLEXTOUCH) 100 UNIT/ML FlexTouch Pen Inject 0.18 mLs (18 Units total) into the skin daily. 15 mL 6   Insulin Pen Needle (NOVOFINE PLUS) 32G X 4 MM MISC 1 Package by Does not apply route 4 (four) times daily. Use as directed 4 times daily E11.65 200 each 6   ipratropium (ATROVENT) 0.06 % nasal spray Place 2 sprays into both nostrils 4 (four) times daily.     levonorgestrel (MIRENA, 52 MG,) 20 MCG/24HR IUD Mirena 20 mcg/24 hours (7 yrs) 52 mg intrauterine device  Take by intrauterine route.     lisinopril (ZESTRIL) 5 MG tablet Take 2.5 mg by mouth daily.     montelukast (SINGULAIR) 10 MG tablet Take 10 mg by mouth at bedtime. Reported on 02/16/2016     ondansetron (ZOFRAN ODT) 4 MG disintegrating tablet Take 1 tablet (4 mg total) by mouth every 8 (eight) hours as needed for nausea or vomiting. 20 tablet 0   ondansetron (ZOFRAN) 4 MG tablet Take 4 mg by mouth as needed for nausea/vomiting.     pregabalin (LYRICA) 100 MG capsule TAKE 1 CAPSULE(100 MG) BY MOUTH THREE TIMES DAILY (Patient taking differently: Take 100 mg by mouth 3 (three) times daily. ) 90 capsule 0   rivaroxaban (XARELTO) 20 MG TABS tablet Take 20 mg by mouth at bedtime.      rizatriptan (MAXALT-MLT) 5 MG disintegrating tablet Take 1 tablet (5 mg total) by mouth as needed for migraine. May repeat in 2 hours if needed 15 tablet 12   SYMBICORT 160-4.5 MCG/ACT inhaler Inhale 2 puffs into the lungs 2 (two)  times daily.     terconazole (TERAZOL 7) 0.4 % vaginal cream SMARTSIG:1 Applicator Vaginal Daily     topiramate (TOPAMAX) 25 MG tablet Take 1 tablet (25 mg total) by mouth at bedtime. 90 tablet 0   No current facility-administered medications on file prior to visit.   Allergies  Allergen Reactions   Silver Other (See Comments) and Rash   Codeine Nausea And Vomiting    Pt takes Percocet without problems    Darvocet [Propoxyphene N-Acetaminophen] Nausea And Vomiting   Glipizide     Diarrhea and abdominal pain    Hydrocodone-Acetaminophen Nausea And Vomiting   Lantus [Insulin Glargine]     Burning at injection site    Hydrocodone-Acetaminophen Nausea And Vomiting    Vomiting   Latex Rash   Tape Itching and Rash   Tramadol Nausea Only    No results found for this or any previous visit (from the past 2160 hour(s)).  Objective: General: Patient is awake, alert, and oriented x 3 and  in no acute distress.  Integument: Skin is warm, dry and supple bilateral. Nails are mildly elongated and mildly dystrophic with very minimal subungual debris, consistent with dystrophy, 1-5 bilateral. No signs of infection. No open lesions or preulcerative lesions present bilateral. Remaining integument unremarkable.  Vasculature:  Dorsalis Pedis pulse 1/4 bilateral. Posterior Tibial pulse 1/4 bilateral.  Capillary fill time <3 sec 1-5 bilateral. Positive hair growth to the level of the digits. Temperature gradient within normal limits. No varicosities present bilateral. No edema present bilateral.   Neurology: The patient has intact sensation measured with a 5.07/10g Semmes Weinstein Monofilament at all pedal sites bilateral . Vibratory sensation diminished bilateral with tuning fork. No Babinski sign present bilateral.   Musculoskeletal: Minimally symptomatic bunions noted bilateral. Muscular strength 5/5 in all lower extremity muscular groups bilateral without pain on range of motion . No  tenderness with calf compression bilateral.  X-rays consistent with bunion deformity as well as there is calcaneal spurring noted no other acute osseous findings.  Assessment and Plan: Problem List Items Addressed This Visit      Endocrine   Type 2 diabetes mellitus with hyperglycemia, without long-term current use of insulin (HCC)   Relevant Medications   glipiZIDE (GLUCOTROL) 5 MG tablet    Other Visit Diagnoses    Pain in left foot    -  Primary   Relevant Orders   DG Foot Complete Left   Pain in right foot       Relevant Orders   DG Foot Complete Right   Nail dystrophy       Bunion          -Examined patient. -Discussed and educated patient on diabetic foot care, especially with regards to the vascular, neurological and musculoskeletal systems.  -Stressed the importance of good glycemic control and the detriment of not controlling glucose levels in relation to the foot. -Mechanically debrided all nails 1-5 bilateral using sterile nail nipper and filed with dremel without incident  -X-rays reviewed -Advised patient that she is not a candidate at this time for elective bunion surgery until she gets her A1c down to 7 -Dispensed bunion shield for patient to use as instructed in shoes -Answered all patient questions -Patient to return  in 3 months for at risk foot care -Patient advised to call the office if any problems or questions arise in the meantime.  Landis Martins, DPM

## 2020-10-17 NOTE — Progress Notes (Signed)
Virtual Visit via Video Note  I connected with Makayla Frazier 10/18/20 at 1:40 PM by a video enabled telemedicine application and verified that I am speaking with the correct person using two identifiers.   I discussed the limitations of evaluation and management by telemedicine and the availability of in person appointments. The patient expressed understanding and agreed to proceed.   -Location of the patient : Home  -Location of the provider : Office -The names of all persons participating in the telemedicine service :myself and NP student Tarri Abernethy           Name: Makayla Frazier  Age/ Sex: 49 y.o., female   MRN/ DOB: [id number], Feb 26, 1971     PCP: Filiberto Pinks   Reason for Endocrinology Evaluation: Type 2 Diabetes Mellitus  Initial Endocrine Consultative Visit:  12/25/2019    PATIENT IDENTIFIER: [name] is a 49 y.o. female with a past medical history of T2DM, HTN and seizure disorder . The patient has followed with Endocrinology clinic since 12/25/2019 for consultative assistance with management of her diabetes.  DIABETIC HISTORY:  Makayla Frazier was diagnosed with T2DM 2009.  She was initially on glipizide and Metformin, but developed diarrhea.  She is intolerant to Victoza due to rash.  Invokana because genital infections. Her hemoglobin A1c has ranged from  7.0% in 2011, peaking at 11.3% in 3013.   On her initial visit to our clinic she had an A1c of 5.6%, she was on Trulicity only.  Glipizide was added  Glipizide switched to basal insulin in 03/2020 due to worsening hyperglycemia.   SUBJECTIVE:   During the last visit (06/28/2020): A1c 12.3  %, We switched Lantus to [place] and  Increased  Trulicity .  Today (10/18/2020): Makayla Frazier is here for a follow up on diabetes management.  She does not check her sugars and would like to have a Dexcom . The patient has not had hypoglycemic episodes since the last clinic visit.   HOME DIABETES REGIMEN:    Tyler Aas 18 units daily - taking 16 units  Trulicity 3 mg weekly (Tuesdays)    Statin: Yes ACE-I/ARB: Yes    DIABETIC COMPLICATIONS: Microvascular complications:   Neuropathy  Denies: CKD , retinopathy  Last eye exam: Completed 2020  Macrovascular complications:   TIA  Denies: CAD, PVD, CVA  HISTORY:  Past Medical History:  Past Medical History:  Diagnosis Date  . Anemia   . Arthritis    Left leg  . Asthma   . BV (bacterial vaginosis) 2012  . Complication of anesthesia    Seizures in PACU after surgery 3/15  . DM (diabetes mellitus) ([place])    diagnosed 2009  . DVT (deep venous thrombosis) ([place]) 2011   pt had IUD; also 05/2012  . Dysfunctional uterine bleeding    Seen by Dr. Leo Grosser, GYN; pending hysterectomy as of 07/2012  . H/O hypercoagulable state    2/2 contraception  . Headache(784.0)    migraines  . Herpes simplex without mention of complication 01/5637   HSV-2  . HLD (hyperlipidemia)   . Hypertension   . Labial lesion 2013  . Major depression    Hx suicide attempt in 2009, [facility] admissions  . Mastodynia   . Neuromuscular disorder (HCC)    neuropathy  . Neuropathy   . Obesity   . Oligomenorrhea 2011  . PE (pulmonary embolism)    2009 - on oral contraception; multiple  . Post - coital bleeding 2012  . Seizures (  [place]) 2015   last episode May 2015  . Sleep apnea   . Status post placement of implantable loop recorder   . Stroke [facility])    TIA 2013  . Syncopal episodes    unknown etiology  . Thyroid disease    hypothyroidism (pt denies)  . Vaginal Pap smear, abnormal   . Venous insufficiency   . Vitamin D deficiency    unknown to pt.   Past Surgical History:  Past Surgical History:  Procedure Laterality Date  . BILATERAL SALPINGECTOMY  12/18/2012   Procedure: BILATERAL SALPINGECTOMY;  Surgeon: Eldred Manges, MD;  Location: [facility];  Service: Gynecology;  Laterality: Bilateral;  . BLADDER SUSPENSION N/A 02/13/2014   Procedure: TRANSVAGINAL  TAPE (TVT) PROCEDURE;  Surgeon: Delice Lesch, MD;  Location: [facility];  Service: Gynecology;  Laterality: N/A;  . BREAST REDUCTION SURGERY     Dr. Eugene Garnet [facility] 2011  . CARDIAC ELECTROPHYSIOLOGY STUDY & DFT     Implantable Loop recorder; no arrhythmias associated with synocpal spells; loop explanted 07-2013  . CHOLECYSTECTOMY    . DILATATION & CURRETTAGE/HYSTEROSCOPY WITH RESECTOCOPE  2007 & 2013  . ENDOMETRIAL BIOPSY  2011  . IUD REMOVAL  12/18/2012   Procedure: INTRAUTERINE DEVICE (IUD) REMOVAL;  Surgeon: Eldred Manges, MD;  Location: [facility];  Service: Gynecology;  Laterality: N/A;  Removed during prep by E. Florene Glen PA  . LAPAROSCOPIC ASSISTED VAGINAL HYSTERECTOMY  12/18/2012   Procedure: LAPAROSCOPIC ASSISTED VAGINAL HYSTERECTOMY;  Surgeon: Eldred Manges, MD;  Location: [facility];  Service: Gynecology;  Laterality: N/A;  . LOOP RECORDER EXPLANT N/A 07/17/2013   Procedure: LOOP RECORDER EXPLANT;  Surgeon: Thompson Grayer, MD;  Location: [facility];  Service: Cardiovascular;  Laterality: N/A;  . TRANSVAGINAL TAPE (TVT) REMOVAL N/A 04/28/2014   Procedure: Removal of suburethral mesh;  Surgeon: Delice Lesch, MD;  Location: [facility];  Service: Gynecology;  Laterality: N/A;  . WISDOM TOOTH EXTRACTION      Social History:  reports that she has never smoked. She has never used smokeless tobacco. She reports current alcohol use. She reports that she does not use drugs. Family History:  Family History  Problem Relation Age of Onset  . Melanoma Father 71  . Diabetes Father   . Hypertension Father   . Kidney disease Father   . Ulcerative colitis Mother 54  . Diabetes Mother   . Hypertension Mother   . Breast cancer Paternal Grandmother   . Cancer Paternal Grandmother   . Diabetes type II Maternal Grandmother        deceased 74  . Diabetes Maternal Grandmother   . Pulmonary embolism Paternal Grandfather   . Stroke Maternal Grandfather      HOME MEDICATIONS: Allergies as of 10/18/2020       Reactions   Silver Other (See Comments), Rash   Codeine Nausea And Vomiting   Pt takes Percocet without problems   Darvocet [propoxyphene N-acetaminophen] Nausea And Vomiting   Glipizide    Diarrhea and abdominal pain    Hydrocodone-acetaminophen Nausea And Vomiting   Lantus [insulin Glargine]    Burning at injection site    Hydrocodone-acetaminophen Nausea And Vomiting   Vomiting   Latex Rash   Tape Itching, Rash   Tramadol Nausea Only      Medication List       Accurate as of October 18, 2020  2:03 PM. If you have any questions, ask your nurse or doctor.  STOP taking these medications   azithromycin 250 MG tablet Commonly known as: ZITHROMAX Stopped by: Dorita Sciara, MD     TAKE these medications   acetaminophen 500 MG tablet Commonly known as: TYLENOL Take 1,000 mg by mouth every 6 (six) hours as needed for moderate pain.   albuterol 108 (90 Base) MCG/ACT inhaler Commonly known as: VENTOLIN HFA Inhale 2 puffs into the lungs every 6 (six) hours as needed for wheezing or shortness of breath.   amLODipine 5 MG tablet Commonly known as: NORVASC Take 2 tablets (10 mg total) by mouth daily. What changed: when to take this   atorvastatin 10 MG tablet Commonly known as: LIPITOR Take 10 mg by mouth at bedtime.   cetirizine 10 MG tablet Commonly known as: ZYRTEC Take 1 tablet (10 mg total) by mouth daily. What changed: when to take this   chlorthalidone 25 MG tablet Commonly known as: HYGROTON TAKE 2 TABLETS(50 MG) BY MOUTH DAILY What changed: See the new instructions.   Dexcom G6 Sensor Misc 1 Device by Does not apply route as directed. Started by: Dorita Sciara, MD   Dexcom G6 Transmitter Misc 1 Device by Does not apply route as directed. Started by: Dorita Sciara, MD   EPINEPHrine 0.3 mg/0.3 mL Soaj injection Commonly known as: EPI-PEN Inject 0.3 mg into the muscle as needed for anaphylaxis.   fluconazole 150 MG  tablet Commonly known as: DIFLUCAN Take 150 mg by mouth every 3 (three) days.   fluticasone 50 MCG/ACT nasal spray Commonly known as: FLONASE Place 1 spray into both nostrils daily as needed (for nasal congestion/sinus infection).   glipiZIDE 5 MG tablet Commonly known as: GLUCOTROL Take 5 mg by mouth 2 (two) times daily.   glucose blood test strip 1 each by Other route as needed for other. Use as instructed to test blood sugar  3 times daily E11.65   ipratropium 0.06 % nasal spray Commonly known as: ATROVENT Place 2 sprays into both nostrils 4 (four) times daily.   lisinopril 5 MG tablet Commonly known as: ZESTRIL Take 2.5 mg by mouth daily.   Mirena (52 MG) 20 MCG/24HR IUD Generic drug: levonorgestrel Mirena 20 mcg/24 hours (7 yrs) 52 mg intrauterine device  Take by intrauterine route.   montelukast 10 MG tablet Commonly known as: SINGULAIR Take 10 mg by mouth at bedtime. Reported on 02/16/2016   NovoFine Plus 32G X 4 MM Misc Generic drug: Insulin Pen Needle 1 Package by Does not apply route 4 (four) times daily. Use as directed 4 times daily E11.65   ondansetron 4 MG disintegrating tablet Commonly known as: Zofran ODT Take 1 tablet (4 mg total) by mouth every 8 (eight) hours as needed for nausea or vomiting.   ondansetron 4 MG tablet Commonly known as: ZOFRAN Take 4 mg by mouth as needed for nausea/vomiting.   pregabalin 100 MG capsule Commonly known as: LYRICA TAKE 1 CAPSULE(100 MG) BY MOUTH THREE TIMES DAILY What changed: See the new instructions.   rizatriptan 5 MG disintegrating tablet Commonly known as: Maxalt-MLT Take 1 tablet (5 mg total) by mouth as needed for migraine. May repeat in 2 hours if needed   Symbicort 160-4.5 MCG/ACT inhaler Generic drug: budesonide-formoterol Inhale 2 puffs into the lungs 2 (two) times daily.   terconazole 0.4 % vaginal cream Commonly known as: TERAZOL 7 SMARTSIG:1 Applicator Vaginal Daily   topiramate 25 MG  tablet Commonly known as: TOPAMAX Take 1 tablet (25 mg total) by mouth at  bedtime.   Tyler Aas FlexTouch 100 UNIT/ML FlexTouch Pen Generic drug: insulin degludec Inject 0.18 mLs (18 Units total) into the skin daily.   Xarelto 20 MG Tabs tablet Generic drug: rivaroxaban Take 20 mg by mouth at bedtime.        DATA REVIEWED:  Lab Results  Component Value Date   HGBA1C 12.3 (A) 06/28/2020   HGBA1C 10.9 (A) 03/29/2020   HGBA1C 9.9 (H) 11/09/2019   Lab Results  Component Value Date   MICROALBUR 0.64 07/04/2010   LDLCALC 103 (H) 11/10/2019   CREATININE 0.94 07/09/2020   Lab Results  Component Value Date   MICRALBCREAT 4.7 07/04/2010     Lab Results  Component Value Date   CHOL 163 11/10/2019   HDL 38 (L) 11/10/2019   LDLCALC 103 (H) 11/10/2019   TRIG 108 11/10/2019   CHOLHDL 4.3 11/10/2019        ASSESSMENT / PLAN / RECOMMENDATIONS:   Type 2 Diabetes Mellitus, Poorly controlled, With neuropathic complications - Most recent A1c of 12.3 %. Goal A1c <7.0%.  - She would like to have a prescription for the dexcom. She was advised to contact us once she obtains it so I can refer her to our CDE for training  - Will increase Tyler Aas as below    MEDICATIONS: Increase Tresiba to 18 units daily  Continue  Trulicity to 3 mg weekly   EDUCATION / INSTRUCTIONS:  BG monitoring instructions: Patient is instructed to check her blood sugars 2 times a day, fasting and bedtime    2) Diabetic complications:   Eye: Does not have known diabetic retinopathy.   Neuro/ Feet: Does have known diabetic peripheral neuropathy.  Renal: Patient does not have known baseline CKD. She is on an ACEI/ARB at present.    F/U in 3 months    Signed electronically by: Mack Guise, MD  [facility] Endocrinology  [facility] [address] [place], [place] [address] Phone: [phone] FAX: [phone]   CC: Filiberto Pinks [address] [place] [address] Phone: [phone]  Fax: [phone]  Return to Endocrinology clinic as below: Future Appointments  Date Time Provider [place]  12/22/2020  4:00 PM Cameron Sprang, MD [facility] None  01/20/2021  9:15 AM Landis Martins, DPM [facility]

## 2020-10-18 ENCOUNTER — Encounter: Payer: Self-pay | Admitting: Internal Medicine

## 2020-10-18 ENCOUNTER — Other Ambulatory Visit: Payer: Self-pay

## 2020-10-18 ENCOUNTER — Telehealth (INDEPENDENT_AMBULATORY_CARE_PROVIDER_SITE_OTHER): Payer: Medicare Other | Admitting: Internal Medicine

## 2020-10-18 DIAGNOSIS — Z794 Long term (current) use of insulin: Secondary | ICD-10-CM

## 2020-10-18 DIAGNOSIS — E1140 Type 2 diabetes mellitus with diabetic neuropathy, unspecified: Secondary | ICD-10-CM

## 2020-10-18 DIAGNOSIS — E1165 Type 2 diabetes mellitus with hyperglycemia: Secondary | ICD-10-CM | POA: Diagnosis not present

## 2020-10-18 MED ORDER — DEXCOM G6 SENSOR MISC: 1.0000 | 6 refills | Status: DC

## 2020-10-18 MED ORDER — DEXCOM G6 TRANSMITTER MISC: 1.0000 | 3 refills | Status: DC

## 2020-10-18 MED ORDER — DEXCOM G6 TRANSMITTER MISC
1.0000 | 3 refills | Status: DC
Start: 2020-10-18 — End: 2021-06-09

## 2020-10-20 ENCOUNTER — Telehealth: Payer: Self-pay | Admitting: Internal Medicine

## 2020-10-20 NOTE — Telephone Encounter (Signed)
Patient called and advised that Dexcom is pending paperwork needs to include what the providers goal for the patient before filling the script.    Call back [phone]

## 2020-10-22 NOTE — Telephone Encounter (Signed)
Spoken to patient on 10/21/2020 and inform her that I have not received anything from her insurance regarding paperwork regarding goals. I told patient that I will call [facility] on Friday 10/22/2020.  I have spoken to the pharmacist and she stated that they have been trying to sent this through Medicare for patient. Most of the time they just needed a code from Medicare  but this time, Medicare is asking for provider to fill out a form  [facility] will give me a call

## 2020-10-29 NOTE — Telephone Encounter (Signed)
Message left for patient to return my call.  

## 2020-10-29 NOTE — Telephone Encounter (Signed)
Patient calling to check status of Dexcom PA. Please call patient at [phone]

## 2020-11-24 ENCOUNTER — Ambulatory Visit (INDEPENDENT_AMBULATORY_CARE_PROVIDER_SITE_OTHER): Payer: Medicare Other | Admitting: Podiatry

## 2020-11-24 ENCOUNTER — Encounter: Payer: Self-pay | Admitting: Podiatry

## 2020-11-24 ENCOUNTER — Other Ambulatory Visit: Payer: Self-pay

## 2020-11-24 DIAGNOSIS — S90111A Contusion of right great toe without damage to nail, initial encounter: Secondary | ICD-10-CM

## 2020-11-24 NOTE — Progress Notes (Signed)
Subjective:   Patient ID: Makayla Frazier, female   DOB: 49 y.o.   MRN: [id number]   HPI Patient presents stating she has discoloration on her left big toenail and she was concerned about this and why it is occurring   ROS      Objective:  Physical Exam  Neurovascular status unchanged with a discolored left hallux nail on the medial side localized with no active drainage noted no pain     Assessment:  Probability that this is trauma of the left big toenail with underlying bleeding     Plan:  Educated her on deformity recommended trimming soaks and do not recommend anything more aggressive and less any issues were to occur

## 2020-12-07 ENCOUNTER — Telehealth (HOSPITAL_COMMUNITY): Payer: Self-pay | Admitting: Nurse Practitioner

## 2020-12-07 NOTE — Telephone Encounter (Signed)
No note to input. 

## 2020-12-08 ENCOUNTER — Encounter: Payer: Self-pay | Admitting: Nurse Practitioner

## 2020-12-08 ENCOUNTER — Telehealth: Payer: Self-pay | Admitting: Nurse Practitioner

## 2020-12-08 DIAGNOSIS — E669 Obesity, unspecified: Secondary | ICD-10-CM | POA: Insufficient documentation

## 2020-12-08 DIAGNOSIS — I639 Cerebral infarction, unspecified: Secondary | ICD-10-CM | POA: Insufficient documentation

## 2020-12-08 DIAGNOSIS — E119 Type 2 diabetes mellitus without complications: Secondary | ICD-10-CM | POA: Insufficient documentation

## 2020-12-08 DIAGNOSIS — I10 Essential (primary) hypertension: Secondary | ICD-10-CM | POA: Insufficient documentation

## 2020-12-08 NOTE — Telephone Encounter (Signed)
Called patient to discuss Covid symptoms and the use of casirivimab/imdevimab, a monoclonal antibody infusion for those with mild to moderate Covid symptoms and at a high risk of hospitalization.  Pt is qualified for this infusion at the [facility] infusion center due to; Specific high risk criteria : BMI > 25, Diabetes, Cardiovascular disease or hypertension and Chronic Lung Disease   Message left to call back our hotline [phone].  Nicolasa Ducking, NP

## 2020-12-22 ENCOUNTER — Other Ambulatory Visit: Payer: Self-pay

## 2020-12-22 ENCOUNTER — Encounter: Payer: Self-pay | Admitting: Neurology

## 2020-12-22 ENCOUNTER — Telehealth (INDEPENDENT_AMBULATORY_CARE_PROVIDER_SITE_OTHER): Payer: Medicare Other | Admitting: Neurology

## 2020-12-22 DIAGNOSIS — G43009 Migraine without aura, not intractable, without status migrainosus: Secondary | ICD-10-CM

## 2020-12-22 DIAGNOSIS — G40009 Localization-related (focal) (partial) idiopathic epilepsy and epileptic syndromes with seizures of localized onset, not intractable, without status epilepticus: Secondary | ICD-10-CM | POA: Diagnosis not present

## 2020-12-22 MED ORDER — TOPIRAMATE 25 MG PO TABS
25.0000 mg | ORAL_TABLET | Freq: Every day | ORAL | 3 refills | Status: DC
Start: 2020-12-22 — End: 2021-06-29

## 2020-12-22 NOTE — Progress Notes (Signed)
Telephone (Audio) Visit The purpose of this telephone visit is to provide medical care while limiting exposure to the novel coronavirus.    Consent was obtained for telephone visit:  Yes.   Answered questions that patient had about telehealth interaction:  Yes.   I discussed the limitations, risks, security and privacy concerns of performing an evaluation and management service by telephone. I also discussed with the patient that there may be a patient responsible charge related to this service. The patient expressed understanding and agreed to proceed.  Pt location: Home Physician Location: office Name of referring provider:  Scifres, Dorothy, PA-C I connected with .Makayla Frazier at patients initiation/request on 12/22/2020 at  3:00 PM EST by telephone and verified that I am speaking with the correct person using two identifiers.  Pt MRN:  [id number] Pt DOB:  03-13-1971   History of Present Illness:  The patient had a telephone visit on 12/22/2020. She was last seen in the neurology clinic 5 months ago for seizures. MRI brain and routine EEG unremarkable. Since her last visit, she denies any episodes of loss of consciousness since 10/2019. Her husband was previously mentioning nocturnal shaking episodes, none in almost a year. She had stopped Levetiracetam while she was sick with COVID in July, and has been taking Topiramate 25mg  qhs for migraine prophylaxis and Lyrica 100mg  TID for neuropathy. Migraines have been well-controlled on low dose Topiramate. She has had another bout of COVID and is awaiting information if she will be receiving the MAb infusion again, and reports having a really bad headache yesterday. She denies any staring/unresponsive episodes, gaps in time, olfactory/gustatory hallucinations, focal numbness/tingling/weakness, myoclonic jerks. No falls.    History on Initial Assessment 11/20/2019: This is a 50 year old right-handed woman with a history of hypertension,  hyperlipidemia, thyroid disease, depression, PE on Xarelto, presenting for evaluation of seizures. Records from her prior neurologist were reviewed, she was last seen at [facility] in 2016. Per records she started having seizures in 2009, although she recalled passing out spells when younger. Seizures were described as feeling hot then passing at, at times with shaking. She has a bad headache after. She had a cardiology evaluation with a loop recorder reportedly normal. She was started on Topiramate at one point, however with abrupt increase from 25mg  BID to 100mg  BID, she had significant cognitive side effects. She has been on Topiramate 25mg  at bedtime for several years for migraine prophylaxis. She reports her last seizure was in December 2015 when she had tremors in her sleep (not convulsions). There is a normal EEG report from 2015. She continued to report nocturnal events and was started on Levetiracetam 500mg  BID. Since starting Levetiracetam, the nocturnal shaking episodes decreased, and she did not have any daytime episodes until she had an episode of shaking with incontinence 7 months ago, then she was brought to [facility] after an episode of loss of consciousness on 11/08/2019. She recalls feeling a pull on her chest, then woke up on the ground. No tongue bite or incontinence, her husband did not witness any shaking. She was a little out of it after with a slight headache, her right arm and leg were weaker and numb, she was dragging her leg like it was deadweight. It took 1-2 days to feel normal. I personally reviewed MRI brain without contrast which did not show any acute changes, hippocampi symmetric, partially empty sella. Her wake and sleep EEG was within normal limits. She reported that she had  cut down Levetiracetam to 500mg  at bedtime a year ago due to drowsiness and nausea, however since hospital discharge she is back to taking 500mg  BID with nausea medication. Her husband has mentioned episodes  where she would stare off or get a little confused, none recently. She has occasional mild tremors in both hands. She denies any olfactory/gustatory hallucinations. She reports similar transient right-sided weakness after a spell last year. She has been doing PT and was told by PT yesterday to use a walker. She denies any neck/back pain.  She has occasional migraines with good response to low dose Topiramate 25mg  at bedtime. When off medications, she gets very photosensitive. Last migraine was last summer. She takes Maxalt prn. She has been on Pregabalin 100mg  TID for neuropathy over the past year, which helps with numbness/tingling in both feet. In the hospital, she had urinary retention, MRI thoracic and lumbar spine were unremarkable, urinary retention has resolved. She has a lot of constipation. She reports a sleep study where she was told she "stopped breathing 10x/hr but did not start CPAP." Her last sleep study in 2015 reportedly showed OSA. She recalls having a prolonged EEG in the past but with no results due to technical difficulties.   Epilepsy Risk Factors:  Maternal grandfather and some cousins had seizures. Otherwise she had a normal birth and early development.  There is no history of febrile convulsions, CNS infections such as meningitis/encephalitis, significant traumatic brain injury, neurosurgical procedures.    Current Outpatient Medications on File Prior to Visit  Medication Sig Dispense Refill  . acetaminophen (TYLENOL) 500 MG tablet Take 1,000 mg by mouth every 6 (six) hours as needed for moderate pain.     Marland Kitchen albuterol (PROVENTIL HFA;VENTOLIN HFA) 108 (90 BASE) MCG/ACT inhaler Inhale 2 puffs into the lungs every 6 (six) hours as needed for wheezing or shortness of breath.    Marland Kitchen amLODipine (NORVASC) 5 MG tablet Take 2 tablets (10 mg total) by mouth daily. (Patient taking differently: Take 10 mg by mouth at bedtime. ) 90 tablet 3  . atorvastatin (LIPITOR) 10 MG tablet Take 10 mg by  mouth at bedtime.    . cetirizine (ZYRTEC) 10 MG tablet Take 1 tablet (10 mg total) by mouth daily. (Patient taking differently: Take 10 mg by mouth at bedtime. ) 90 tablet 3  . chlorthalidone (HYGROTON) 25 MG tablet TAKE 2 TABLETS(50 MG) BY MOUTH DAILY (Patient taking differently: Take 50 mg by mouth at bedtime. ) 180 tablet 0  . Continuous Blood Gluc Sensor (DEXCOM G6 SENSOR) MISC 1 Device by Does not apply route as directed. 3 each 6  . Continuous Blood Gluc Transmit (DEXCOM G6 TRANSMITTER) MISC 1 Device by Does not apply route as directed. 1 each 3  . EPINEPHrine 0.3 mg/0.3 mL IJ SOAJ injection Inject 0.3 mg into the muscle as needed for anaphylaxis.     . fluconazole (DIFLUCAN) 150 MG tablet Take 150 mg by mouth every 3 (three) days.    . fluticasone (FLONASE) 50 MCG/ACT nasal spray Place 1 spray into both nostrils daily as needed (for nasal congestion/sinus infection).     Marland Kitchen glipiZIDE (GLUCOTROL) 5 MG tablet Take 5 mg by mouth 2 (two) times daily.    Marland Kitchen glucose blood test strip 1 each by Other route as needed for other. Use as instructed to test blood sugar  3 times daily E11.65 100 each 11  . insulin degludec (TRESIBA FLEXTOUCH) 100 UNIT/ML FlexTouch Pen Inject 0.18 mLs (18 Units total) into  the skin daily. 15 mL 6  . Insulin Pen Needle (NOVOFINE PLUS) 32G X 4 MM MISC 1 Package by Does not apply route 4 (four) times daily. Use as directed 4 times daily E11.65 200 each 6  . ipratropium (ATROVENT) 0.06 % nasal spray Place 2 sprays into both nostrils 4 (four) times daily.    Marland Kitchen levonorgestrel (MIRENA, 52 MG,) 20 MCG/24HR IUD Mirena 20 mcg/24 hours (7 yrs) 52 mg intrauterine device  Take by intrauterine route.    Marland Kitchen lisinopril (ZESTRIL) 5 MG tablet Take 2.5 mg by mouth daily.    . montelukast (SINGULAIR) 10 MG tablet Take 10 mg by mouth at bedtime. Reported on 02/16/2016    . ondansetron (ZOFRAN ODT) 4 MG disintegrating tablet Take 1 tablet (4 mg total) by mouth every 8 (eight) hours as needed for  nausea or vomiting. 20 tablet 0  . ondansetron (ZOFRAN) 4 MG tablet Take 4 mg by mouth as needed for nausea/vomiting.    . pregabalin (LYRICA) 100 MG capsule TAKE 1 CAPSULE(100 MG) BY MOUTH THREE TIMES DAILY (Patient taking differently: Take 100 mg by mouth 3 (three) times daily. ) 90 capsule 0  . rivaroxaban (XARELTO) 20 MG TABS tablet Take 20 mg by mouth at bedtime.     . rizatriptan (MAXALT-MLT) 5 MG disintegrating tablet Take 1 tablet (5 mg total) by mouth as needed for migraine. May repeat in 2 hours if needed 15 tablet 12  . SYMBICORT 160-4.5 MCG/ACT inhaler Inhale 2 puffs into the lungs 2 (two) times daily.    Marland Kitchen terconazole (TERAZOL 7) 0.4 % vaginal cream SMARTSIG:1 Applicator Vaginal Daily    . topiramate (TOPAMAX) 25 MG tablet Take 1 tablet (25 mg total) by mouth at bedtime. 90 tablet 0   No current facility-administered medications on file prior to visit.      Observations/Objective:   Exam limited due to nature of phone visit. Patient is awake, able to answer questions without dysarthria or confusion.   Assessment and Plan:   This is a 50 yo RH woman with a history of hypertension, hyperlipidemia, thyroid disease, depression, PE on Xarelto, migraines, neuropathy, with recurrent seizures. She reports a history of nocturnal shaking episodes, as well as episodes of loss of consciousness with post-event right-sided weakness suggestive of focal to bilateral tonic-clonic seizures arising from the left hemisphere. MRI brain and routine EEG unremarkable. No further episodes of loss of consciousness since 10/2019, her husband has not mentioned any nocturnal shaking episodes in almost a year. Continue Topiramate 25mg  qhs for migraine prophylaxis and Lyrica 100mg  TID for neuropathy, we discussed how these are also AEDs. She is aware of [place] driving laws to stop driving after a seizure until 6 months seizure-free. Follow-up in 6 months, she knows to call for any changes.    Follow Up  Instructions:   -I discussed the assessment and treatment plan with the patient. The patient was provided an opportunity to ask questions and all were answered. The patient agreed with the plan and demonstrated an understanding of the instructions.   The patient was advised to call back or seek an in-person evaluation if the symptoms worsen or if the condition fails to improve as anticipated.    Total Time spent in visit with the patient was:  9 minutes, of which 100% of the time was spent in counseling and/or coordinating care on the above.   Pt understands and agrees with the plan of care outlined.     Cameron Sprang,  MD   

## 2020-12-23 ENCOUNTER — Ambulatory Visit: Payer: Self-pay

## 2020-12-24 DIAGNOSIS — E1142 Type 2 diabetes mellitus with diabetic polyneuropathy: Secondary | ICD-10-CM | POA: Diagnosis not present

## 2020-12-24 DIAGNOSIS — U071 COVID-19: Secondary | ICD-10-CM | POA: Diagnosis not present

## 2020-12-24 DIAGNOSIS — R059 Cough, unspecified: Secondary | ICD-10-CM | POA: Diagnosis not present

## 2020-12-24 DIAGNOSIS — J45909 Unspecified asthma, uncomplicated: Secondary | ICD-10-CM | POA: Diagnosis not present

## 2021-01-20 ENCOUNTER — Ambulatory Visit (INDEPENDENT_AMBULATORY_CARE_PROVIDER_SITE_OTHER): Payer: Medicare Other | Admitting: Sports Medicine

## 2021-01-20 ENCOUNTER — Encounter: Payer: Self-pay | Admitting: Sports Medicine

## 2021-01-20 ENCOUNTER — Other Ambulatory Visit: Payer: Self-pay

## 2021-01-20 DIAGNOSIS — B351 Tinea unguium: Secondary | ICD-10-CM

## 2021-01-20 DIAGNOSIS — E1165 Type 2 diabetes mellitus with hyperglycemia: Secondary | ICD-10-CM

## 2021-01-20 DIAGNOSIS — M79675 Pain in left toe(s): Secondary | ICD-10-CM | POA: Diagnosis not present

## 2021-01-20 DIAGNOSIS — M79674 Pain in right toe(s): Secondary | ICD-10-CM

## 2021-01-20 DIAGNOSIS — L608 Other nail disorders: Secondary | ICD-10-CM

## 2021-01-20 NOTE — Progress Notes (Signed)
Subjective: Makayla Frazier is a 50 y.o. female patient with history of diabetes who presents to office today complaining of long,mildly painful nails  while ambulating in shoes; unable to trim. Patient states that the glucose reading this morning was not recorded by last A1c was 12.3. Patient denies any new changes in medication or new problems.  Patient Active Problem List   Diagnosis Date Noted  . DM (diabetes mellitus) ([place])   . Obesity   . Stroke ([place])   . Hypertension   . Herpes simplex type 2 infection 10/14/2020  . Type 2 diabetes mellitus with hyperglycemia, without long-term current use of insulin ([place]) 12/25/2019  . Type 2 diabetes mellitus with diabetic neuropathy, with long-term current use of insulin ([place]) 12/25/2019  . Transient loss of consciousness 11/09/2019  . Weakness of right leg 11/09/2019  . Multiple idiopathic pulmonary cysts 11/09/2019  . Hypertensive urgency 11/09/2019  . Hyperlipidemia associated with type 2 diabetes mellitus ([place]) 06/27/2018  . GAD (generalized anxiety disorder) 08/21/2016  . Moderate single current episode of major depressive disorder ([place]) 08/21/2016  . Reaction to severe stress 03/07/2016  . Stress 03/07/2016  . Bleeding per rectum 11/29/2015  . Acute pansinusitis 10/20/2015  . Candida vaginitis 10/20/2015  . Cerebrovascular accident, late effects 08/30/2015  . D (diarrhea) 08/02/2015  . Loose bowel movement 08/02/2015  . Seizure disorder ([place]) 07/13/2015  . Adnexal pain 06/08/2015  . Right flank pain 05/30/2015  . Chronic pulmonary embolism ([place]) 05/14/2015  . Long term current use of anticoagulant 05/14/2015  . Seizures ([place]) 05/07/2014  . Absence of bladder continence 03/27/2014  . Arthropathia 02/25/2014  . Chronic chest pain 02/25/2014  . Healed or old pulmonary embolism 02/25/2014  . HLD (hyperlipidemia) 02/25/2014  . Headache, migraine 02/25/2014  . Obstructive apnea 02/25/2014  . Uncontrolled diabetes mellitus with  hyperglycemia ([place]) 02/25/2014  . Incontinence 02/13/2014  . S/P vaginal hysterectomy 01/29/2013  . TIA (transient ischemic attack) 08/03/2012  . Left-sided weakness 08/03/2012  . Hypokalemia 08/03/2012  . Chest pain 08/03/2012  . Temporary cerebral vascular dysfunction 08/03/2012  . History of pulmonary embolism 05/18/2012  . Symptomatic mammary hypertrophy 10/12/2011  . Patellar tendinitis of left knee 08/21/2011  . Knee pain, left 07/07/2011  . PRURITUS 07/04/2010  . Syncope and collapse 10/18/2009  . Allergic rhinitis 08/11/2009  . Unspecified vitamin D deficiency 07/21/2009  . Adiposity 07/19/2009  . Apnea, sleep 07/19/2009  . ANEMIA 07/10/2008  . Essential hypertension 07/08/2008  . Insulin dependent diabetes mellitus with complications ([place]) 86/76/1950  . Peripheral neuropathy 01/22/2008  . Asthma 01/22/2008  . Mononeuritis 01/22/2008   Current Outpatient Medications on File Prior to Visit  Medication Sig Dispense Refill  . acetaminophen (TYLENOL) 500 MG tablet Take 1,000 mg by mouth every 6 (six) hours as needed for moderate pain.     Marland Kitchen albuterol (PROVENTIL HFA;VENTOLIN HFA) 108 (90 BASE) MCG/ACT inhaler Inhale 2 puffs into the lungs every 6 (six) hours as needed for wheezing or shortness of breath.    Marland Kitchen amLODipine (NORVASC) 5 MG tablet Take 2 tablets (10 mg total) by mouth daily. (Patient taking differently: Take 10 mg by mouth at bedtime.) 90 tablet 3  . atorvastatin (LIPITOR) 10 MG tablet Take 10 mg by mouth at bedtime.    . cetirizine (ZYRTEC) 10 MG tablet Take 1 tablet (10 mg total) by mouth daily. (Patient taking differently: Take 10 mg by mouth at bedtime.) 90 tablet 3  . chlorthalidone (HYGROTON) 25 MG tablet TAKE 2 TABLETS(50  MG) BY MOUTH DAILY (Patient taking differently: Take 50 mg by mouth at bedtime.) 180 tablet 0  . Continuous Blood Gluc Sensor (DEXCOM G6 SENSOR) MISC 1 Device by Does not apply route as directed. 3 each 6  . Continuous Blood Gluc Transmit  (DEXCOM G6 TRANSMITTER) MISC 1 Device by Does not apply route as directed. 1 each 3  . EPINEPHrine 0.3 mg/0.3 mL IJ SOAJ injection Inject 0.3 mg into the muscle as needed for anaphylaxis.     . fluticasone (FLONASE) 50 MCG/ACT nasal spray Place 1 spray into both nostrils daily as needed (for nasal congestion/sinus infection).     Marland Kitchen glipiZIDE (GLUCOTROL) 5 MG tablet Take 5 mg by mouth 2 (two) times daily.    Marland Kitchen glucose blood test strip 1 each by Other route as needed for other. Use as instructed to test blood sugar  3 times daily E11.65 100 each 11  . insulin degludec (TRESIBA FLEXTOUCH) 100 UNIT/ML FlexTouch Pen Inject 0.18 mLs (18 Units total) into the skin daily. 15 mL 6  . Insulin Pen Needle (NOVOFINE PLUS) 32G X 4 MM MISC 1 Package by Does not apply route 4 (four) times daily. Use as directed 4 times daily E11.65 200 each 6  . ipratropium (ATROVENT) 0.06 % nasal spray Place 2 sprays into both nostrils 4 (four) times daily.    Marland Kitchen lisinopril (ZESTRIL) 5 MG tablet Take 2.5 mg by mouth daily.    . montelukast (SINGULAIR) 10 MG tablet Take 10 mg by mouth at bedtime. Reported on 02/16/2016    . ondansetron (ZOFRAN ODT) 4 MG disintegrating tablet Take 1 tablet (4 mg total) by mouth every 8 (eight) hours as needed for nausea or vomiting. 20 tablet 0  . ondansetron (ZOFRAN) 4 MG tablet Take 4 mg by mouth as needed for nausea/vomiting.    . pregabalin (LYRICA) 100 MG capsule TAKE 1 CAPSULE(100 MG) BY MOUTH THREE TIMES DAILY (Patient taking differently: Take 100 mg by mouth 3 (three) times daily.) 90 capsule 0  . rivaroxaban (XARELTO) 20 MG TABS tablet Take 20 mg by mouth at bedtime.    . SYMBICORT 160-4.5 MCG/ACT inhaler Inhale 2 puffs into the lungs 2 (two) times daily.    Marland Kitchen terconazole (TERAZOL 7) 0.4 % vaginal cream SMARTSIG:1 Applicator Vaginal Daily    . topiramate (TOPAMAX) 25 MG tablet Take 1 tablet (25 mg total) by mouth at bedtime. 90 tablet 3   No current facility-administered medications on file  prior to visit.   Allergies  Allergen Reactions  . Silver Other (See Comments) and Rash  . Codeine Nausea And Vomiting    Pt takes Percocet without problems   . Darvocet [Propoxyphene N-Acetaminophen] Nausea And Vomiting  . Glipizide     Diarrhea and abdominal pain   . Hydrocodone-Acetaminophen Nausea And Vomiting  . Lantus [Insulin Glargine]     Burning at injection site   . Hydrocodone-Acetaminophen Nausea And Vomiting    Vomiting  . Latex Rash  . Tape Itching and Rash  . Tramadol Nausea Only    No results found for this or any previous visit (from the past 2160 hour(s)).  Objective: General: Patient is awake, alert, and oriented x 3 and in no acute distress.  Integument: Skin is warm, dry and supple bilateral. Nails are tender, long, thickened and  dystrophic with subungual debris, consistent with onychomycosis, 1-5 bilateral. Dry blood at left hallux nail is growing out and doing well. No signs of infection. No open lesions or preulcerative  lesions present bilateral. Remaining integument unremarkable.  Vasculature:  Dorsalis Pedis pulse 2/4 bilateral. Posterior Tibial pulse  2/4 bilateral.  Capillary fill time <3 sec 1-5 bilateral. Positive hair growth to the level of the digits. Temperature gradient within normal limits. No varicosities present bilateral. No edema present bilateral.   Neurology: The patient has intact sensation measured with a 5.07/10g Semmes Weinstein Monofilament at all pedal sites bilateral . Vibratory sensation diminished bilateral with tuning fork. No Babinski sign present bilateral.   Musculoskeletal: No symptomatic pedal deformities noted bilateral. Muscular strength 5/5 in all lower extremity muscular groups bilateral without pain on range of motion . No tenderness with calf compression bilateral.  Assessment and Plan: Problem List Items Addressed This Visit      Endocrine   Type 2 diabetes mellitus with hyperglycemia, without long-term current  use of insulin ([place])    Other Visit Diagnoses    Pain due to onychomycosis of toenails of both feet    -  Primary   Hemorrhage of nail          -Examined patient. -Discussed and educated patient on diabetic foot care, especially with  regards to the vascular, neurological and musculoskeletal systems.  -Stressed the importance of good glycemic control and the detriment of not  controlling glucose levels in relation to the foot. -Mechanically debrided all nails 1-5 bilateral using sterile nail nipper and filed with dremel without incident  -Answered all patient questions -Patient to return  in 3 months for at risk foot care -Patient advised to call the office if any problems or questions arise in the meantime.  Landis Martins, DPM

## 2021-02-16 DIAGNOSIS — M5442 Lumbago with sciatica, left side: Secondary | ICD-10-CM | POA: Diagnosis not present

## 2021-02-17 ENCOUNTER — Telehealth: Payer: Self-pay | Admitting: Sports Medicine

## 2021-02-17 ENCOUNTER — Encounter: Payer: Self-pay | Admitting: Sports Medicine

## 2021-02-17 NOTE — Telephone Encounter (Signed)
Called patient lvm to reschedule 5/12 appt to 5/19. Also sent letter.

## 2021-02-24 DIAGNOSIS — L650 Telogen effluvium: Secondary | ICD-10-CM | POA: Diagnosis not present

## 2021-02-24 DIAGNOSIS — L218 Other seborrheic dermatitis: Secondary | ICD-10-CM | POA: Diagnosis not present

## 2021-03-03 DIAGNOSIS — Z1231 Encounter for screening mammogram for malignant neoplasm of breast: Secondary | ICD-10-CM | POA: Diagnosis not present

## 2021-03-08 ENCOUNTER — Encounter: Payer: Self-pay | Admitting: Internal Medicine

## 2021-03-08 DIAGNOSIS — E119 Type 2 diabetes mellitus without complications: Secondary | ICD-10-CM | POA: Diagnosis not present

## 2021-03-08 DIAGNOSIS — H2513 Age-related nuclear cataract, bilateral: Secondary | ICD-10-CM | POA: Diagnosis not present

## 2021-03-08 DIAGNOSIS — H40013 Open angle with borderline findings, low risk, bilateral: Secondary | ICD-10-CM | POA: Diagnosis not present

## 2021-03-08 DIAGNOSIS — H524 Presbyopia: Secondary | ICD-10-CM | POA: Diagnosis not present

## 2021-03-08 DIAGNOSIS — H25013 Cortical age-related cataract, bilateral: Secondary | ICD-10-CM | POA: Diagnosis not present

## 2021-03-08 LAB — HM DIABETES EYE EXAM

## 2021-03-19 ENCOUNTER — Other Ambulatory Visit: Payer: Self-pay | Admitting: Internal Medicine

## 2021-03-22 DIAGNOSIS — M5459 Other low back pain: Secondary | ICD-10-CM | POA: Diagnosis not present

## 2021-03-28 ENCOUNTER — Encounter: Payer: Self-pay | Admitting: Internal Medicine

## 2021-04-14 DIAGNOSIS — M5416 Radiculopathy, lumbar region: Secondary | ICD-10-CM | POA: Diagnosis not present

## 2021-04-21 ENCOUNTER — Ambulatory Visit: Payer: Medicare Other | Admitting: Sports Medicine

## 2021-04-21 ENCOUNTER — Telehealth: Payer: Self-pay | Admitting: Internal Medicine

## 2021-04-21 DIAGNOSIS — R0981 Nasal congestion: Secondary | ICD-10-CM | POA: Diagnosis not present

## 2021-04-21 DIAGNOSIS — J4531 Mild persistent asthma with (acute) exacerbation: Secondary | ICD-10-CM | POA: Diagnosis not present

## 2021-04-21 DIAGNOSIS — R110 Nausea: Secondary | ICD-10-CM | POA: Diagnosis not present

## 2021-04-21 DIAGNOSIS — J0110 Acute frontal sinusitis, unspecified: Secondary | ICD-10-CM | POA: Diagnosis not present

## 2021-04-21 DIAGNOSIS — Z03818 Encounter for observation for suspected exposure to other biological agents ruled out: Secondary | ICD-10-CM | POA: Diagnosis not present

## 2021-04-21 DIAGNOSIS — R0602 Shortness of breath: Secondary | ICD-10-CM | POA: Diagnosis not present

## 2021-04-21 DIAGNOSIS — Z7984 Long term (current) use of oral hypoglycemic drugs: Secondary | ICD-10-CM | POA: Diagnosis not present

## 2021-04-21 DIAGNOSIS — E1142 Type 2 diabetes mellitus with diabetic polyneuropathy: Secondary | ICD-10-CM | POA: Diagnosis not present

## 2021-04-21 DIAGNOSIS — J301 Allergic rhinitis due to pollen: Secondary | ICD-10-CM | POA: Diagnosis not present

## 2021-04-21 NOTE — Telephone Encounter (Signed)
Katie from [employer] is calling on behalf of patient. [facility] is stating if we could send all of the medications provided by Dr.Shamleffer to [facility].   Preferred pharmacy of pt  [facility], [place], [place] [address]  Fax [phone]

## 2021-04-22 MED ORDER — NOVOFINE PLUS PEN NEEDLE 32G X 4 MM MISC: 2 refills | Status: DC

## 2021-04-22 MED ORDER — TRULICITY 3 MG/0.5ML ~~LOC~~ SOAJ: 3.0000 mg | SUBCUTANEOUS | 2 refills | Status: DC

## 2021-04-22 MED ORDER — TRESIBA FLEXTOUCH 100 UNIT/ML ~~LOC~~ SOPN
18.0000 [IU] | PEN_INJECTOR | Freq: Every day | SUBCUTANEOUS | 6 refills | Status: DC
Start: 2021-04-22 — End: 2021-06-10

## 2021-04-22 NOTE — Telephone Encounter (Signed)
Tresiba 18 units daily  Trulicity 3 mg weekly   Sent Rx to [facility]

## 2021-04-25 DIAGNOSIS — J4531 Mild persistent asthma with (acute) exacerbation: Secondary | ICD-10-CM | POA: Diagnosis not present

## 2021-04-25 DIAGNOSIS — E785 Hyperlipidemia, unspecified: Secondary | ICD-10-CM | POA: Diagnosis not present

## 2021-04-25 DIAGNOSIS — J45909 Unspecified asthma, uncomplicated: Secondary | ICD-10-CM | POA: Diagnosis not present

## 2021-04-25 DIAGNOSIS — D509 Iron deficiency anemia, unspecified: Secondary | ICD-10-CM | POA: Diagnosis not present

## 2021-04-25 DIAGNOSIS — E1165 Type 2 diabetes mellitus with hyperglycemia: Secondary | ICD-10-CM | POA: Diagnosis not present

## 2021-04-25 DIAGNOSIS — G43009 Migraine without aura, not intractable, without status migrainosus: Secondary | ICD-10-CM | POA: Diagnosis not present

## 2021-04-25 DIAGNOSIS — E1142 Type 2 diabetes mellitus with diabetic polyneuropathy: Secondary | ICD-10-CM | POA: Diagnosis not present

## 2021-04-25 DIAGNOSIS — I10 Essential (primary) hypertension: Secondary | ICD-10-CM | POA: Diagnosis not present

## 2021-04-26 DIAGNOSIS — M6289 Other specified disorders of muscle: Secondary | ICD-10-CM | POA: Insufficient documentation

## 2021-04-26 DIAGNOSIS — R2240 Localized swelling, mass and lump, unspecified lower limb: Secondary | ICD-10-CM | POA: Diagnosis not present

## 2021-04-26 DIAGNOSIS — M545 Low back pain, unspecified: Secondary | ICD-10-CM | POA: Insufficient documentation

## 2021-04-28 ENCOUNTER — Ambulatory Visit: Payer: Medicare Other | Admitting: Sports Medicine

## 2021-05-02 ENCOUNTER — Other Ambulatory Visit: Payer: Self-pay | Admitting: Orthopedic Surgery

## 2021-05-02 DIAGNOSIS — M545 Low back pain, unspecified: Secondary | ICD-10-CM

## 2021-05-12 ENCOUNTER — Other Ambulatory Visit: Payer: Self-pay

## 2021-05-12 ENCOUNTER — Encounter: Payer: Self-pay | Admitting: Sports Medicine

## 2021-05-12 ENCOUNTER — Ambulatory Visit (INDEPENDENT_AMBULATORY_CARE_PROVIDER_SITE_OTHER): Payer: Medicare Other | Admitting: Sports Medicine

## 2021-05-12 DIAGNOSIS — L83 Acanthosis nigricans: Secondary | ICD-10-CM | POA: Insufficient documentation

## 2021-05-12 DIAGNOSIS — G43009 Migraine without aura, not intractable, without status migrainosus: Secondary | ICD-10-CM | POA: Insufficient documentation

## 2021-05-12 DIAGNOSIS — M79675 Pain in left toe(s): Secondary | ICD-10-CM | POA: Diagnosis not present

## 2021-05-12 DIAGNOSIS — E1165 Type 2 diabetes mellitus with hyperglycemia: Secondary | ICD-10-CM | POA: Diagnosis not present

## 2021-05-12 DIAGNOSIS — M79674 Pain in right toe(s): Secondary | ICD-10-CM

## 2021-05-12 DIAGNOSIS — D72829 Elevated white blood cell count, unspecified: Secondary | ICD-10-CM | POA: Insufficient documentation

## 2021-05-12 DIAGNOSIS — D509 Iron deficiency anemia, unspecified: Secondary | ICD-10-CM | POA: Insufficient documentation

## 2021-05-12 DIAGNOSIS — B351 Tinea unguium: Secondary | ICD-10-CM | POA: Diagnosis not present

## 2021-05-12 DIAGNOSIS — F40243 Fear of flying: Secondary | ICD-10-CM | POA: Insufficient documentation

## 2021-05-12 DIAGNOSIS — J301 Allergic rhinitis due to pollen: Secondary | ICD-10-CM | POA: Insufficient documentation

## 2021-05-12 DIAGNOSIS — K649 Unspecified hemorrhoids: Secondary | ICD-10-CM | POA: Insufficient documentation

## 2021-05-12 DIAGNOSIS — J4531 Mild persistent asthma with (acute) exacerbation: Secondary | ICD-10-CM | POA: Insufficient documentation

## 2021-05-12 DIAGNOSIS — E1142 Type 2 diabetes mellitus with diabetic polyneuropathy: Secondary | ICD-10-CM | POA: Insufficient documentation

## 2021-05-12 DIAGNOSIS — M5430 Sciatica, unspecified side: Secondary | ICD-10-CM | POA: Insufficient documentation

## 2021-05-12 DIAGNOSIS — Z9109 Other allergy status, other than to drugs and biological substances: Secondary | ICD-10-CM | POA: Insufficient documentation

## 2021-05-12 DIAGNOSIS — Z8616 Personal history of COVID-19: Secondary | ICD-10-CM | POA: Insufficient documentation

## 2021-05-12 NOTE — Progress Notes (Signed)
Subjective: Makayla Frazier is a 50 y.o. female patient with history of diabetes who presents to office today complaining of long,mildly painful nails  while ambulating in shoes; unable to trim. Patient states that the glucose reading this morning was not recorded by last A1c was 12.3 like before and patient reports that she is working with her endocrinologist to try to get her blood sugars down she missed her last appointment with them. Patient denies any new changes in medication or new problems.  Patient Active Problem List   Diagnosis Date Noted  . Acanthosis nigricans 05/12/2021  . Allergic rhinitis due to pollen 05/12/2021  . Fear of flying 05/12/2021  . Hemorrhoids 05/12/2021  . History of 2019 novel coronavirus disease (COVID-19) 05/12/2021  . Iron deficiency anemia 05/12/2021  . Leukocytosis 05/12/2021  . Migraine without aura, not refractory 05/12/2021  . Mild persistent asthma with (acute) exacerbation 05/12/2021  . Other allergy status, other than to drugs and biological substances 05/12/2021  . Polyneuropathy due to type 2 diabetes mellitus ([place]) 05/12/2021  . Sciatica 05/12/2021  . Low back pain 04/26/2021  . Mass of psoas muscle 04/26/2021  . DM (diabetes mellitus) ([place])   . Obesity   . Stroke ([place])   . Hypertension   . Herpes simplex type 2 infection 10/14/2020  . Type 2 diabetes mellitus with hyperglycemia, without long-term current use of insulin ([place]) 12/25/2019  . Type 2 diabetes mellitus with diabetic neuropathy, with long-term current use of insulin ([place]) 12/25/2019  . Transient loss of consciousness 11/09/2019  . Weakness of right leg 11/09/2019  . Multiple idiopathic pulmonary cysts 11/09/2019  . Hypertensive urgency 11/09/2019  . Hyperlipidemia associated with type 2 diabetes mellitus ([place]) 06/27/2018  . GAD (generalized anxiety disorder) 08/21/2016  . Moderate single current episode of major depressive disorder ([place]) 08/21/2016  . Reaction to severe stress  03/07/2016  . Stress 03/07/2016  . Bleeding per rectum 11/29/2015  . Acute pansinusitis 10/20/2015  . Candida vaginitis 10/20/2015  . Cerebrovascular accident, late effects 08/30/2015  . D (diarrhea) 08/02/2015  . Loose bowel movement 08/02/2015  . Seizure disorder ([place]) 07/13/2015  . Adnexal pain 06/08/2015  . Right flank pain 05/30/2015  . Chronic pulmonary embolism ([place]) 05/14/2015  . Long term current use of anticoagulant 05/14/2015  . Seizures ([place]) 05/07/2014  . Absence of bladder continence 03/27/2014  . Arthropathia 02/25/2014  . Chronic chest pain 02/25/2014  . Healed or old pulmonary embolism 02/25/2014  . HLD (hyperlipidemia) 02/25/2014  . Headache, migraine 02/25/2014  . Obstructive apnea 02/25/2014  . Uncontrolled diabetes mellitus with hyperglycemia ([place]) 02/25/2014  . Incontinence 02/13/2014  . S/P vaginal hysterectomy 01/29/2013  . TIA (transient ischemic attack) 08/03/2012  . Left-sided weakness 08/03/2012  . Hypokalemia 08/03/2012  . Chest pain 08/03/2012  . Temporary cerebral vascular dysfunction 08/03/2012  . History of pulmonary embolism 05/18/2012  . Symptomatic mammary hypertrophy 10/12/2011  . Patellar tendinitis of left knee 08/21/2011  . Knee pain, left 07/07/2011  . PRURITUS 07/04/2010  . Syncope and collapse 10/18/2009  . Allergic rhinitis 08/11/2009  . Unspecified vitamin D deficiency 07/21/2009  . Adiposity 07/19/2009  . Apnea, sleep 07/19/2009  . ANEMIA 07/10/2008  . Essential hypertension 07/08/2008  . Insulin dependent diabetes mellitus with complications ([place]) 75/09/2584  . Peripheral neuropathy 01/22/2008  . Asthma 01/22/2008  . Mononeuritis 01/22/2008   Current Outpatient Medications on File Prior to Visit  Medication Sig Dispense Refill  . amoxicillin (AMOXIL) 500 MG tablet 2 tablets    .  levonorgestrel (MIRENA, 52 MG,) 20 MCG/DAY IUD Mirena 20 mcg/24 hours (7 yrs) 52 mg intrauterine device  Take by intrauterine route.    .  triamcinolone (NASACORT ALLERGY 24HR) 55 MCG/ACT AERO nasal inhaler 1 spray in each nostril    . acetaminophen (TYLENOL) 500 MG tablet Take 1,000 mg by mouth every 6 (six) hours as needed for moderate pain.     Marland Kitchen albuterol (PROVENTIL HFA;VENTOLIN HFA) 108 (90 BASE) MCG/ACT inhaler Inhale 2 puffs into the lungs every 6 (six) hours as needed for wheezing or shortness of breath.    Marland Kitchen amLODipine (NORVASC) 5 MG tablet Take 2 tablets (10 mg total) by mouth daily. (Patient taking differently: Take 10 mg by mouth at bedtime.) 90 tablet 3  . atorvastatin (LIPITOR) 10 MG tablet Take 10 mg by mouth at bedtime.    . benzonatate (TESSALON) 100 MG capsule 1 capsule as needed    . cetirizine (ZYRTEC) 10 MG tablet Take 1 tablet (10 mg total) by mouth daily. (Patient taking differently: Take 10 mg by mouth at bedtime.) 90 tablet 3  . chlorthalidone (HYGROTON) 25 MG tablet TAKE 2 TABLETS(50 MG) BY MOUTH DAILY (Patient taking differently: Take 50 mg by mouth at bedtime.) 180 tablet 0  . Continuous Blood Gluc Sensor (DEXCOM G6 SENSOR) MISC 1 Device by Does not apply route as directed. 3 each 6  . Continuous Blood Gluc Transmit (DEXCOM G6 TRANSMITTER) MISC 1 Device by Does not apply route as directed. 1 each 3  . cyclobenzaprine (FLEXERIL) 5 MG tablet cyclobenzaprine 5 mg tablet  Take 1 tablet twice a day by oral route as needed for 20 days.    . Dulaglutide (TRULICITY) 3 JI/9.6VE SOPN Inject 3 mg as directed once a week. 2 mL 2  . EPINEPHrine 0.3 mg/0.3 mL IJ SOAJ injection Inject 0.3 mg into the muscle as needed for anaphylaxis.     . fluconazole (DIFLUCAN) 150 MG tablet Take 150 mg by mouth every 3 (three) days.    . Fluocinolone Acetonide Body 0.01 % OIL fluocinolone 0.01 % scalp oil and shower cap  APPLY SMALL AMOUNT TOPICALLY TO THE AFFECTED AREA 2 TO 3 TIMES WEEKLY AS DIRECTED    . Fluocinolone Acetonide Scalp 0.01 % OIL     . fluticasone (FLONASE) 50 MCG/ACT nasal spray Place 1 spray into both nostrils daily as  needed (for nasal congestion/sinus infection).     Marland Kitchen glipiZIDE (GLUCOTROL) 5 MG tablet 1 tablet 30 minutes before breakfast    . glucose blood test strip 1 each by Other route as needed for other. Use as instructed to test blood sugar  3 times daily E11.65 100 each 11  . insulin degludec (TRESIBA FLEXTOUCH) 100 UNIT/ML FlexTouch Pen Inject 18 Units into the skin daily. 15 mL 6  . insulin glargine (LANTUS SOLOSTAR) 100 UNIT/ML Solostar Pen Lantus Solostar U-100 Insulin 100 unit/mL (3 mL) subcutaneous pen  ADMINISTER 16 UNITS UNDER THE SKIN DAILY    . Insulin Pen Needle (NOVOFINE PLUS PEN NEEDLE) 32G X 4 MM MISC Use as directed  daily E11.65 100 each 2  . ipratropium (ATROVENT) 0.06 % nasal spray Place 2 sprays into both nostrils 4 (four) times daily.    Marland Kitchen levocetirizine (XYZAL) 5 MG tablet 1 tablet in the evening    . lisinopril (ZESTRIL) 5 MG tablet Take 2.5 mg by mouth daily.    . montelukast (SINGULAIR) 10 MG tablet Take 10 mg by mouth at bedtime. Reported on 02/16/2016    .  ondansetron (ZOFRAN ODT) 4 MG disintegrating tablet Take 1 tablet (4 mg total) by mouth every 8 (eight) hours as needed for nausea or vomiting. 20 tablet 0  . ondansetron (ZOFRAN) 4 MG tablet Take 4 mg by mouth as needed for nausea/vomiting.    . pregabalin (LYRICA) 100 MG capsule TAKE 1 CAPSULE(100 MG) BY MOUTH THREE TIMES DAILY (Patient taking differently: Take 100 mg by mouth 3 (three) times daily.) 90 capsule 0  . rivaroxaban (XARELTO) 20 MG TABS tablet Take 20 mg by mouth at bedtime.    . SYMBICORT 160-4.5 MCG/ACT inhaler Inhale 2 puffs into the lungs 2 (two) times daily.    Marland Kitchen terconazole (TERAZOL 7) 0.4 % vaginal cream SMARTSIG:1 Applicator Vaginal Daily    . topiramate (TOPAMAX) 25 MG tablet Take 1 tablet (25 mg total) by mouth at bedtime. 90 tablet 3   No current facility-administered medications on file prior to visit.   Allergies  Allergen Reactions  . Silver Other (See Comments) and Rash  . Codeine Nausea And  Vomiting    Pt takes Percocet without problems   . Darvocet [Propoxyphene N-Acetaminophen] Nausea And Vomiting  . Glipizide     Diarrhea and abdominal pain   . Hydrocodone-Acetaminophen Nausea And Vomiting  . Lantus [Insulin Glargine]     Burning at injection site   . Liraglutide     Other reaction(s): upset stomach  . Propoxyphene     Other reaction(s): nausea  . Hydrocodone-Acetaminophen Nausea And Vomiting    Vomiting  . Latex Rash  . Tape Itching and Rash  . Tramadol Nausea Only    Recent Results (from the past 2160 hour(s))  HM DIABETES EYE EXAM     Status: None   Collection Time: 03/08/21 11:03 AM  Result Value Ref Range   [facility] Diabetic Eye Exam No Retinopathy No Retinopathy    Objective: General: Patient is awake, alert, and oriented x 3 and in no acute distress.  Integument: Skin is warm, dry and supple bilateral. Nails are tender, long, thickened and  dystrophic with subungual debris, consistent with onychomycosis, 1-5 bilateral. No signs of infection. No open lesions or preulcerative lesions present bilateral. Remaining integument unremarkable.  Vasculature:  Dorsalis Pedis pulse 2/4 bilateral. Posterior Tibial pulse  2/4 bilateral.  Capillary fill time <3 sec 1-5 bilateral. Positive hair growth to the level of the digits. Temperature gradient within normal limits. No varicosities present bilateral. No edema present bilateral.   Neurology: The patient has intact sensation measured with a 5.07/10g Semmes Weinstein Monofilament at all pedal sites bilateral . Vibratory sensation diminished bilateral with tuning fork. No Babinski sign present bilateral.  Unchanged from prior.  Musculoskeletal: No symptomatic pedal deformities noted bilateral.  Bunion deformity noted but currently asymptomatic.  Muscular strength 5/5 in all lower extremity muscular groups bilateral without pain on range of motion . No tenderness with calf compression bilateral.  Assessment and  Plan: Problem List Items Addressed This Visit      Endocrine   Type 2 diabetes mellitus with hyperglycemia, without long-term current use of insulin (H[facility])   Relevant Medications   glipiZIDE (GLUCOTROL) 5 MG tablet   insulin glargine (LANTUS SOLOSTAR) 100 UNIT/ML Solostar Pen    Other Visit Diagnoses    Pain due to onychomycosis of toenails of both feet    -  Primary   Relevant Medications   fluconazole (DIFLUCAN) 150 MG tablet      -Examined patient. -Discussed and educated patient again on diabetic foot care, especially  with  regards to the vascular, neurological and musculoskeletal systems.  -Stressed the importance of good glycemic control and the detriment of not  controlling glucose levels in relation to the foot like before. -Mechanically debrided all nails 1-5 bilateral using sterile nail nipper and filed with dremel without incident  -Answered all patient questions -Patient to return  in 3 months for at risk foot care -Patient advised to call the office if any problems or questions arise in the meantime.  Landis Martins, DPM

## 2021-05-25 ENCOUNTER — Ambulatory Visit
Admission: RE | Admit: 2021-05-25 | Discharge: 2021-05-25 | Disposition: A | Discharge status: Home / Self Care | Payer: Medicare Other | Source: Ambulatory Visit | Attending: Orthopedic Surgery | Admitting: Orthopedic Surgery

## 2021-05-25 ENCOUNTER — Other Ambulatory Visit: Payer: Self-pay

## 2021-05-25 DIAGNOSIS — R1900 Intra-abdominal and pelvic swelling, mass and lump, unspecified site: Secondary | ICD-10-CM | POA: Diagnosis not present

## 2021-05-25 DIAGNOSIS — M545 Low back pain, unspecified: Secondary | ICD-10-CM

## 2021-05-25 MED ORDER — IOPAMIDOL (ISOVUE-300) INJECTION 61%
100.0000 mL | Freq: Once | INTRAVENOUS | Status: AC | PRN
  Administered 2021-05-25: 100 mL via INTRAVENOUS

## 2021-05-26 DIAGNOSIS — E1165 Type 2 diabetes mellitus with hyperglycemia: Secondary | ICD-10-CM | POA: Diagnosis not present

## 2021-05-26 DIAGNOSIS — I10 Essential (primary) hypertension: Secondary | ICD-10-CM | POA: Diagnosis not present

## 2021-05-26 DIAGNOSIS — E785 Hyperlipidemia, unspecified: Secondary | ICD-10-CM | POA: Diagnosis not present

## 2021-05-26 DIAGNOSIS — J45909 Unspecified asthma, uncomplicated: Secondary | ICD-10-CM | POA: Diagnosis not present

## 2021-05-26 DIAGNOSIS — E1142 Type 2 diabetes mellitus with diabetic polyneuropathy: Secondary | ICD-10-CM | POA: Diagnosis not present

## 2021-05-26 DIAGNOSIS — J4531 Mild persistent asthma with (acute) exacerbation: Secondary | ICD-10-CM | POA: Diagnosis not present

## 2021-05-26 DIAGNOSIS — G43009 Migraine without aura, not intractable, without status migrainosus: Secondary | ICD-10-CM | POA: Diagnosis not present

## 2021-05-26 DIAGNOSIS — D509 Iron deficiency anemia, unspecified: Secondary | ICD-10-CM | POA: Diagnosis not present

## 2021-06-09 ENCOUNTER — Other Ambulatory Visit: Payer: Self-pay

## 2021-06-09 ENCOUNTER — Ambulatory Visit (INDEPENDENT_AMBULATORY_CARE_PROVIDER_SITE_OTHER): Payer: Medicare Other | Admitting: Internal Medicine

## 2021-06-09 VITALS — BP 110/80 | HR 100 | Wt 151.6 lb

## 2021-06-09 DIAGNOSIS — E1140 Type 2 diabetes mellitus with diabetic neuropathy, unspecified: Secondary | ICD-10-CM | POA: Diagnosis not present

## 2021-06-09 DIAGNOSIS — E1165 Type 2 diabetes mellitus with hyperglycemia: Secondary | ICD-10-CM | POA: Diagnosis not present

## 2021-06-09 DIAGNOSIS — Z794 Long term (current) use of insulin: Secondary | ICD-10-CM

## 2021-06-09 DIAGNOSIS — E785 Hyperlipidemia, unspecified: Secondary | ICD-10-CM

## 2021-06-09 LAB — BASIC METABOLIC PANEL
BUN: 14 mg/dL (ref 6–23)
CO2: 32 mEq/L (ref 19–32)
Calcium: 9.4 mg/dL (ref 8.4–10.5)
Chloride: 98 mEq/L (ref 96–112)
Creatinine, Ser: 1.09 mg/dL (ref 0.40–1.20)
GFR: 59.61 mL/min — ABNORMAL LOW (ref >60.00)
Glucose, Bld: 265 mg/dL — ABNORMAL HIGH (ref 70–99)
Potassium: 3 mEq/L — ABNORMAL LOW (ref 3.5–5.1)
Sodium: 138 mEq/L (ref 135–145)

## 2021-06-09 LAB — MICROALBUMIN / CREATININE URINE RATIO
Creatinine,U: 278.7 mg/dL
Microalb Creat Ratio: 1.2 mg/g (ref 0.0–30.0)
Microalb, Ur: 3.4 mg/dL — ABNORMAL HIGH (ref 0.0–1.9)

## 2021-06-09 LAB — LIPID PANEL
Cholesterol: 145 mg/dL (ref 0–200)
HDL: 42.4 mg/dL (ref >39.00)
LDL Cholesterol: 65 mg/dL (ref 0–99)
NonHDL: 102.51
Total CHOL/HDL Ratio: 3
Triglycerides: 189 mg/dL — ABNORMAL HIGH (ref 0.0–149.0)
VLDL: 37.8 mg/dL (ref 0.0–40.0)

## 2021-06-09 LAB — HEMOGLOBIN A1C: Hgb A1c MFr Bld: 14.5 % — ABNORMAL HIGH (ref 4.6–6.5)

## 2021-06-09 MED ORDER — FREESTYLE LIBRE 2 SENSOR MISC: 1.0000 | 3 refills | Status: DC

## 2021-06-09 NOTE — Progress Notes (Signed)
Name: Makayla Frazier  Age/ Sex: 50 y.o., female   MRN/ DOB: [id number], 1971/04/06     PCP: Perlie Mayo Durel Salts   Reason for Endocrinology Evaluation: Type 2 Diabetes Mellitus  Initial Endocrine Consultative Visit:  12/25/2019    PATIENT IDENTIFIER: Makayla Frazier is a 50 y.o. female with a past medical history of T2DM, HTN and seizure disorder . The patient has followed with Endocrinology clinic since 12/25/2019 for consultative assistance with management of her diabetes.  DIABETIC HISTORY:  Ms. Bjorklund was diagnosed with T2DM 2009.  She was initially on glipizide and Metformin, but developed diarrhea.  She is intolerant to Victoza due to rash.  Invokana because genital infections. Her hemoglobin A1c has ranged from  7.0% in 2011, peaking at 11.3% in 3013.   On her initial visit to our clinic she had an A1c of 7.6%, she was on Trulicity only.  Glipizide was added  Glipizide switched to basal insulin in 03/2020 due to worsening hyperglycemia.   SUBJECTIVE:   During the last visit (10/18/2020): This was a virtual visit , We increased [place] and continued Trulicity    Today (7/34/1937): [name] is here for a follow up on diabetes management.  She checked her blood sugars 0 times a day. The patient is not known to have hypoglycemic episodes since the last clinic visit.  Has been having issues with sinus , was on Abx but no steroids.  She has been on 1.5 mg weekly of Trulicity  instead of 3 mg. This was changed in a month   Denies nausea or diarrhea   Follows with [facility]   She need bunion sx but pending glycemic optimization  She is also pending sx for psoas muscle mass    HOME DIABETES REGIMEN:  Tresiba 19 units daily  Trulicity 3 mg weekly (Fridays)  Glipizide 5 mg, 1 tablet daily     Statin: Yes ACE-I/ARB: Yes    METER DOWNLOAD SUMMARY: Did not bring    DIABETIC COMPLICATIONS: Microvascular complications:  Neuropathy Denies: CKD ,  retinopathy Last eye exam: Completed 2022   Macrovascular complications:  TIA Denies: CAD, PVD, CVA  HISTORY:  Past Medical History:  Past Medical History:  Diagnosis Date   Anemia    Arthritis    Left leg   Asthma    BV (bacterial vaginosis) 9024   Complication of anesthesia    Seizures in PACU after surgery 3/15   DM (diabetes mellitus) ([place])    diagnosed 2009   DVT (deep venous thrombosis) ([place]) 2011   pt had IUD; also 05/2012   Dysfunctional uterine bleeding    Seen by Dr. Leo Grosser, GYN; pending hysterectomy as of 07/2012   H/O hypercoagulable state    2/2 contraception   Headache(784.0)    migraines   Herpes simplex without mention of complication 0/9735   HSV-2   HLD (hyperlipidemia)    Hypertension    Labial lesion 2013   Major depression    Hx suicide attempt in 2009, [facility] admissions   Mastodynia    Neuromuscular disorder ([place])    neuropathy   Neuropathy    Obesity    Oligomenorrhea 2011   PE (pulmonary embolism)    2009 - on oral contraception; multiple   Post - coital bleeding 2012   Seizures ([place]) 2015   last episode May 2015   Sleep apnea    Status post placement of implantable loop recorder    Stroke ([place])  TIA 2013   Syncopal episodes    unknown etiology   Thyroid disease    hypothyroidism (pt denies)   Vaginal Pap smear, abnormal    Venous insufficiency    Vitamin D deficiency    unknown to pt.   Past Surgical History:  Past Surgical History:  Procedure Laterality Date   BILATERAL SALPINGECTOMY  12/18/2012   Procedure: BILATERAL SALPINGECTOMY;  Surgeon: Eldred Manges, MD;  Location: [facility];  Service: Gynecology;  Laterality: Bilateral;   BLADDER SUSPENSION N/A 02/13/2014   Procedure: TRANSVAGINAL TAPE (TVT) PROCEDURE;  Surgeon: Delice Lesch, MD;  Location: [facility];  Service: Gynecology;  Laterality: N/A;   BREAST REDUCTION SURGERY     Dr. Eugene Garnet [place] DFT     Implantable Loop recorder; no  arrhythmias associated with synocpal spells; loop explanted 07-2013   CHOLECYSTECTOMY     DILATATION & CURRETTAGE/HYSTEROSCOPY WITH RESECTOCOPE  2007 & 2013   ENDOMETRIAL BIOPSY  2011   IUD REMOVAL  12/18/2012   Procedure: INTRAUTERINE DEVICE (IUD) REMOVAL;  Surgeon: Eldred Manges, MD;  Location: [facility];  Service: Gynecology;  Laterality: N/A;  Removed during prep by E. Powell PA   LAPAROSCOPIC ASSISTED VAGINAL HYSTERECTOMY  12/18/2012   Procedure: LAPAROSCOPIC ASSISTED VAGINAL HYSTERECTOMY;  Surgeon: Eldred Manges, MD;  Location: [facility];  Service: Gynecology;  Laterality: N/A;   LOOP RECORDER EXPLANT N/A 07/17/2013   Procedure: LOOP RECORDER EXPLANT;  Surgeon: Thompson Grayer, MD;  Location: [facility];  Service: Cardiovascular;  Laterality: N/A;   TRANSVAGINAL TAPE (TVT) REMOVAL N/A 04/28/2014   Procedure: Removal of suburethral mesh;  Surgeon: Delice Lesch, MD;  Location: [facility];  Service: Gynecology;  Laterality: N/A;   WISDOM TOOTH EXTRACTION     Social History:  reports that she has never smoked. She has never used smokeless tobacco. She reports current alcohol use. She reports that she does not use drugs. Family History:  Family History  Problem Relation Age of Onset   Melanoma Father 31   Diabetes Father    Hypertension Father    Kidney disease Father    Ulcerative colitis Mother 12   Diabetes Mother    Hypertension Mother    Breast cancer Paternal Grandmother    Cancer Paternal Grandmother    Diabetes type II Maternal Grandmother        deceased 51   Diabetes Maternal Grandmother    Pulmonary embolism Paternal Grandfather    Stroke Maternal Grandfather      HOME MEDICATIONS: Allergies as of 06/09/2021       Reactions   Silver Other (See Comments), Rash   Codeine Nausea And Vomiting   Pt takes Percocet without problems   Darvocet [propoxyphene N-acetaminophen] Nausea And Vomiting   Glipizide    Diarrhea and abdominal pain    Hydrocodone-acetaminophen Nausea And  Vomiting   Lantus [insulin Glargine]    Burning at injection site    Liraglutide    Other reaction(s): upset stomach   Propoxyphene    Other reaction(s): nausea   Hydrocodone-acetaminophen Nausea And Vomiting   Vomiting   Latex Rash   Tape Itching, Rash   Tramadol Nausea Only        Medication List        Accurate as of June 09, 2021  7:56 AM. If you have any questions, ask your nurse or doctor.          STOP taking these medications  Lantus SoloStar 100 UNIT/ML Solostar Pen Generic drug: insulin glargine Stopped by: Dorita Sciara, MD   Mirena (52 MG) 20 MCG/DAY Iud Generic drug: levonorgestrel Stopped by: Dorita Sciara, MD       TAKE these medications    acetaminophen 500 MG tablet Commonly known as: TYLENOL Take 1,000 mg by mouth every 6 (six) hours as needed for moderate pain.   albuterol 108 (90 Base) MCG/ACT inhaler Commonly known as: VENTOLIN HFA Inhale 2 puffs into the lungs every 6 (six) hours as needed for wheezing or shortness of breath.   amLODipine 5 MG tablet Commonly known as: NORVASC Take 2 tablets (10 mg total) by mouth daily. What changed: when to take this   amoxicillin 500 MG tablet Commonly known as: AMOXIL 2 tablets   atorvastatin 10 MG tablet Commonly known as: LIPITOR Take 10 mg by mouth at bedtime.   benzonatate 100 MG capsule Commonly known as: TESSALON 1 capsule as needed   cetirizine 10 MG tablet Commonly known as: ZYRTEC Take 1 tablet (10 mg total) by mouth daily. What changed: when to take this   chlorthalidone 25 MG tablet Commonly known as: HYGROTON TAKE 2 TABLETS(50 MG) BY MOUTH DAILY What changed: See the new instructions.   cyclobenzaprine 5 MG tablet Commonly known as: FLEXERIL cyclobenzaprine 5 mg tablet  Take 1 tablet twice a day by oral route as needed for 20 days.   Dexcom G6 Sensor Misc 1 Device by Does not apply route as directed.   Dexcom G6 Transmitter Misc 1 Device by Does  not apply route as directed.   EPINEPHrine 0.3 mg/0.3 mL Soaj injection Commonly known as: EPI-PEN Inject 0.3 mg into the muscle as needed for anaphylaxis.   fluconazole 150 MG tablet Commonly known as: DIFLUCAN Take 150 mg by mouth every 3 (three) days.   Fluocinolone Acetonide Body 0.01 % Oil fluocinolone 0.01 % scalp oil and shower cap  APPLY SMALL AMOUNT TOPICALLY TO THE AFFECTED AREA 2 TO 3 TIMES WEEKLY AS DIRECTED   Fluocinolone Acetonide Scalp 0.01 % Oil   fluticasone 50 MCG/ACT nasal spray Commonly known as: FLONASE Place 1 spray into both nostrils daily as needed (for nasal congestion/sinus infection).   glipiZIDE 5 MG tablet Commonly known as: GLUCOTROL 1 tablet 30 minutes before breakfast   glucose blood test strip 1 each by Other route as needed for other. Use as instructed to test blood sugar  3 times daily E11.65   ipratropium 0.06 % nasal spray Commonly known as: ATROVENT Place 2 sprays into both nostrils 4 (four) times daily.   levocetirizine 5 MG tablet Commonly known as: XYZAL 1 tablet in the evening   lisinopril 5 MG tablet Commonly known as: ZESTRIL Take 2.5 mg by mouth daily.   montelukast 10 MG tablet Commonly known as: SINGULAIR Take 10 mg by mouth at bedtime. Reported on 02/16/2016   Nasacort Allergy 24HR 55 MCG/ACT Aero nasal inhaler Generic drug: triamcinolone 1 spray in each nostril   NovoFine Plus Pen Needle 32G X 4 MM Misc Generic drug: Insulin Pen Needle Use as directed  daily E11.65   ondansetron 4 MG disintegrating tablet Commonly known as: Zofran ODT Take 1 tablet (4 mg total) by mouth every 8 (eight) hours as needed for nausea or vomiting.   ondansetron 4 MG tablet Commonly known as: ZOFRAN Take 4 mg by mouth as needed for nausea/vomiting.   pregabalin 100 MG capsule Commonly known as: LYRICA TAKE 1 CAPSULE(100 MG) BY MOUTH THREE  TIMES DAILY What changed: See the new instructions.   Symbicort 160-4.5 MCG/ACT  inhaler Generic drug: budesonide-formoterol Inhale 2 puffs into the lungs 2 (two) times daily.   terconazole 0.4 % vaginal cream Commonly known as: TERAZOL 7 SMARTSIG:1 Applicator Vaginal Daily   topiramate 25 MG tablet Commonly known as: TOPAMAX Take 1 tablet (25 mg total) by mouth at bedtime.   Tyler Aas FlexTouch 100 UNIT/ML FlexTouch Pen Generic drug: insulin degludec Inject 18 Units into the skin daily.   Trulicity 3 OJ/5.0KX Sopn Generic drug: Dulaglutide Inject 3 mg as directed once a week.   Xarelto 20 MG Tabs tablet Generic drug: rivaroxaban Take 20 mg by mouth at bedtime.         OBJECTIVE:   Vital Signs: BP 110/80   Pulse 100   Wt 151 lb 9.6 oz (68.8 kg)   LMP 11/06/2012   SpO2 99%   BMI 29.61 kg/m   Wt Readings from Last 3 Encounters:  06/09/21 151 lb 9.6 oz (68.8 kg)  08/04/20 147 lb (66.7 kg)  07/09/20 157 lb (71.2 kg)     Exam: General: Pt appears well and is in NAD  Lungs: Clear with good BS bilat with no rales, rhonchi, or wheezes  Heart: RRR with normal S1 and S2 and no gallops; no murmurs; no rub  Extremities: No pretibial edema.   Neuro: MS is good with appropriate affect, pt is alert and Ox3   DM foot exam: 06/09/2021   The skin of the feet is intact without sores or ulcerations. The pedal pulses are 2+ on right and 2+ on left. The sensation is decreased  to a screening 5.07, 10 gram monofilament bilaterally     DATA REVIEWED:  Lab Results  Component Value Date   HGBA1C 12.3 (A) 06/28/2020   HGBA1C 10.9 (A) 03/29/2020   HGBA1C 9.9 (H) 11/09/2019  Results for LEANN, MAYWEATHER (MRN [id number]) as of 06/10/2021 15:23  Ref. Range 06/09/2021 08:13  Sodium Latest Ref Range: 135 - 145 mEq/L 138  Potassium Latest Ref Range: 3.5 - 5.1 mEq/L 3.0 (L)  Chloride Latest Ref Range: 96 - 112 mEq/L 98  CO2 Latest Ref Range: 19 - 32 mEq/L 32  Glucose Latest Ref Range: 70 - 99 mg/dL 265 (H)  BUN Latest Ref Range: 6 - 23 mg/dL 14  Creatinine  Latest Ref Range: 0.40 - 1.20 mg/dL 1.09  Calcium Latest Ref Range: 8.4 - 10.5 mg/dL 9.4  GFR Latest Ref Range: >60.00 mL/min 59.61 (L)  Total CHOL/HDL Ratio Unknown 3  Cholesterol Latest Ref Range: 0 - 200 mg/dL 145  HDL Cholesterol Latest Ref Range: >39.00 mg/dL 42.40  LDL (calc) Latest Ref Range: 0 - 99 mg/dL 65  MICROALB/CREAT RATIO Latest Ref Range: 0.0 - 30.0 mg/g 1.2  NonHDL Unknown 102.51  Triglycerides Latest Ref Range: 0.0 - 149.0 mg/dL 189.0 (H)  VLDL Latest Ref Range: 0.0 - 40.0 mg/dL 37.8  Hemoglobin A1C Latest Ref Range: 4.6 - 6.5 % 14.5 (H)  Creatinine,U Latest Units: mg/dL 278.7  Microalb, Ur Latest Ref Range: 0.0 - 1.9 mg/dL 3.4 (H)    ASSESSMENT / PLAN / RECOMMENDATIONS:   Type 2 Diabetes Mellitus, Poorly controlled, With neuropathic complications - Most recent A1c of 14.5 %. Goal A1c <7.0%.  - Pt continues with poor glycemic control, she admits to medication nonadherence especially with the [place]. -We will make the following adjustments -Patient understands that if she would like to proceed with any surgical intervention the A1c goal is<7.5  percent otherwise her risk of infection is very high, we also discussed poor healing process with hyperglycemia -I also have recommended add-on therapy with pioglitazone to improve insulin resistance, cautioned against edema  MEDICATIONS:  Increase Tresiba to 24 units daily  continue Trulicity to 3 mg weekly  Start pioglitazone 15 mg daily  EDUCATION / INSTRUCTIONS: BG monitoring instructions: Patient is instructed to check her blood sugars 2 times a day, fasting and bedtime  Call [facility] if: BG persistently > 250 I reviewed the Rule of 15 for the treatment of hypoglycemia in detail with the patient. Literature supplied.   2) Diabetic complications:  Eye: Does not have known diabetic retinopathy.  Neuro/ Feet: Does have known diabetic peripheral neuropathy. Renal: Patient does not have known  baseline CKD. She is on an ACEI/ARB at present.  F/U in 3 months    Addendum: Discussed lab results with pt on 06/10/2021 at 1530  Signed electronically by: Mack Guise, MD  [facility]  [facility] [address] [place], [place] [address] Phone: [phone] FAX: [phone]   CC: [address], [place] [address] Phone: [phone]  Fax: [phone]  Return to Endocrinology clinic as below: Future Appointments  Date Time Provider [place]  06/29/2021 11:30 AM Cameron Sprang, MD [facility] None  08/18/2021  1:45 PM Landis Martins, DPM [facility]

## 2021-06-10 ENCOUNTER — Encounter: Payer: Self-pay | Admitting: Internal Medicine

## 2021-06-10 MED ORDER — PIOGLITAZONE HCL 15 MG PO TABS: 15.0000 mg | ORAL_TABLET | Freq: Every day | ORAL | 3 refills | Status: DC

## 2021-06-10 MED ORDER — TRESIBA FLEXTOUCH 100 UNIT/ML ~~LOC~~ SOPN: 24.0000 [IU] | PEN_INJECTOR | Freq: Every day | SUBCUTANEOUS | 3 refills | Status: DC

## 2021-06-15 DIAGNOSIS — D509 Iron deficiency anemia, unspecified: Secondary | ICD-10-CM | POA: Diagnosis not present

## 2021-06-15 DIAGNOSIS — J45909 Unspecified asthma, uncomplicated: Secondary | ICD-10-CM | POA: Diagnosis not present

## 2021-06-15 DIAGNOSIS — G43009 Migraine without aura, not intractable, without status migrainosus: Secondary | ICD-10-CM | POA: Diagnosis not present

## 2021-06-15 DIAGNOSIS — J4531 Mild persistent asthma with (acute) exacerbation: Secondary | ICD-10-CM | POA: Diagnosis not present

## 2021-06-15 DIAGNOSIS — E1165 Type 2 diabetes mellitus with hyperglycemia: Secondary | ICD-10-CM | POA: Diagnosis not present

## 2021-06-15 DIAGNOSIS — E1142 Type 2 diabetes mellitus with diabetic polyneuropathy: Secondary | ICD-10-CM | POA: Diagnosis not present

## 2021-06-15 DIAGNOSIS — I10 Essential (primary) hypertension: Secondary | ICD-10-CM | POA: Diagnosis not present

## 2021-06-15 DIAGNOSIS — E785 Hyperlipidemia, unspecified: Secondary | ICD-10-CM | POA: Diagnosis not present

## 2021-06-29 ENCOUNTER — Other Ambulatory Visit: Payer: Self-pay

## 2021-06-29 ENCOUNTER — Encounter: Payer: Self-pay | Admitting: Neurology

## 2021-06-29 ENCOUNTER — Telehealth (INDEPENDENT_AMBULATORY_CARE_PROVIDER_SITE_OTHER): Payer: Medicare Other | Admitting: Neurology

## 2021-06-29 VITALS — Ht 60.0 in | Wt 151.0 lb

## 2021-06-29 DIAGNOSIS — G40009 Localization-related (focal) (partial) idiopathic epilepsy and epileptic syndromes with seizures of localized onset, not intractable, without status epilepticus: Secondary | ICD-10-CM | POA: Diagnosis not present

## 2021-06-29 DIAGNOSIS — G43009 Migraine without aura, not intractable, without status migrainosus: Secondary | ICD-10-CM

## 2021-06-29 MED ORDER — TOPIRAMATE 25 MG PO TABS: 25.0000 mg | ORAL_TABLET | Freq: Every day | ORAL | 3 refills | Status: DC

## 2021-06-29 NOTE — Progress Notes (Signed)
Virtual Visit via Video Note The purpose of this virtual visit is to provide medical care while limiting exposure to the novel coronavirus.    Consent was obtained for video visit:  Yes.   Answered questions that patient had about telehealth interaction:  Yes.   I discussed the limitations, risks, security and privacy concerns of performing an evaluation and management service by telemedicine. I also discussed with the patient that there may be a patient responsible charge related to this service. The patient expressed understanding and agreed to proceed.  Pt location: Home Physician Location: office Name of referring provider:  Scifres, Dorothy, PA-C I connected with Makayla Frazier at patients initiation/request on 06/29/2021 at 11:30 AM EDT by video enabled telemedicine application and verified that I am speaking with the correct person using two identifiers. Pt MRN:  [id number] Pt DOB:  [birth year] Video Participants:  Makayla Frazier   History of Present Illness:  The patient was seen as a virtual video visit on 06/29/2021. She requested to switch to a virtual visit due to COVID exposure from her mother. She was last evaluated in the neurology clinic 6 months ago for seizures and migraines. MRI brain and routine EEG unremarkable. She denies any episodes of loss of consciousness since 10/2019. Her husband was previously mentioning nocturnal shaking episodes, there have been none in over a year. She is on Topiramate 25mg  qhs for migraine prophylaxis with good response, however when she switched pharmacies and Topiramate was not included in her pillpack, she started having more headaches. She tries to catch them with rescue medication when mild. She has also noticed a little dizziness when bending down, no falls. She denies any staring/unresponsive episodes, gaps in time, olfactory/gustatory hallucinations. She has occasional twitching in her arms. She may drop things in her hands, left more than  right. Her right hand gets tight and she cannot grip as well. There is some numbness and tingling in her fingers. She is on Lyrica 100mg  TID for neuropathy prescribed by her PCP.    History on Initial Assessment 11/20/2019: This is a 50 year old right-handed woman with a history of hypertension, hyperlipidemia, thyroid disease, depression, PE on Xarelto, presenting for evaluation of seizures. Records from her prior neurologist were reviewed, she was last seen at [facility] in 2016. Per records she started having seizures in 2009, although she recalled passing out spells when younger. Seizures were described as feeling hot then passing at, at times with shaking. She has a bad headache after. She had a cardiology evaluation with a loop recorder reportedly normal. She was started on Topiramate at one point, however with abrupt increase from 25mg  BID to 100mg  BID, she had significant cognitive side effects. She has been on Topiramate 25mg  at bedtime for several years for migraine prophylaxis. She reports her last seizure was in December 2015 when she had tremors in her sleep (not convulsions). There is a normal EEG report from 2015. She continued to report nocturnal events and was started on Levetiracetam 500mg  BID. Since starting Levetiracetam, the nocturnal shaking episodes decreased, and she did not have any daytime episodes until she had an episode of shaking with incontinence 7 months ago, then she was brought to [facility] after an episode of loss of consciousness on 11/08/2019. She recalls feeling a pull on her chest, then woke up on the ground. No tongue bite or incontinence, her husband did not witness any shaking. She was a little out of it after with a slight  headache, her right arm and leg were weaker and numb, she was dragging her leg like it was deadweight. It took 1-2 days to feel normal. I personally reviewed MRI brain without contrast which did not show any acute changes, hippocampi symmetric,  partially empty sella. Her wake and sleep EEG was within normal limits. She reported that she had cut down Levetiracetam to 500mg  at bedtime a year ago due to drowsiness and nausea, however since hospital discharge she is back to taking 500mg  BID with nausea medication. Her husband has mentioned episodes where she would stare off or get a little confused, none recently. She has occasional mild tremors in both hands. She denies any olfactory/gustatory hallucinations. She reports similar transient right-sided weakness after a spell last year. She has been doing PT and was told by PT yesterday to use a walker. She denies any neck/back pain.  She has occasional migraines with good response to low dose Topiramate 25mg  at bedtime. When off medications, she gets very photosensitive. Last migraine was last summer. She takes Maxalt prn. She has been on Pregabalin 100mg  TID for neuropathy over the past year, which helps with numbness/tingling in both feet. In the hospital, she had urinary retention, MRI thoracic and lumbar spine were unremarkable, urinary retention has resolved. She has a lot of constipation. She reports a sleep study where she was told she "stopped breathing 10x/hr but did not start CPAP." Her last sleep study in 2015 reportedly showed OSA. She recalls having a prolonged EEG in the past but with no results due to technical difficulties.   Epilepsy Risk Factors:  Maternal grandfather and some cousins had seizures. Otherwise she had a normal birth and early development.  There is no history of febrile convulsions, CNS infections such as meningitis/encephalitis, significant traumatic brain injury, neurosurgical procedures.  Prior AEDs: Keppra   Current Outpatient Medications on File Prior to Visit  Medication Sig Dispense Refill   acetaminophen (TYLENOL) 500 MG tablet Take 1,000 mg by mouth every 6 (six) hours as needed for moderate pain.      albuterol (PROVENTIL HFA;VENTOLIN HFA) 108 (90 BASE)  MCG/ACT inhaler Inhale 2 puffs into the lungs every 6 (six) hours as needed for wheezing or shortness of breath.     amLODipine (NORVASC) 5 MG tablet Take 2 tablets (10 mg total) by mouth daily. (Patient taking differently: Take 10 mg by mouth at bedtime.) 90 tablet 3   atorvastatin (LIPITOR) 10 MG tablet Take 10 mg by mouth at bedtime.     benzonatate (TESSALON) 100 MG capsule 1 capsule as needed     cetirizine (ZYRTEC) 10 MG tablet Take 1 tablet (10 mg total) by mouth daily. (Patient taking differently: Take 10 mg by mouth at bedtime.) 90 tablet 3   chlorthalidone (HYGROTON) 25 MG tablet TAKE 2 TABLETS(50 MG) BY MOUTH DAILY (Patient taking differently: Take 50 mg by mouth at bedtime.) 180 tablet 0   Continuous Blood Gluc Sensor (FREESTYLE LIBRE 2 SENSOR) MISC 1 Device by Does not apply route as directed. Change every 14 days 6 each 3   cyclobenzaprine (FLEXERIL) 5 MG tablet cyclobenzaprine 5 mg tablet  Take 1 tablet twice a day by oral route as needed for 20 days.     Dulaglutide (TRULICITY) 3 YP/9.5KD SOPN Inject 3 mg as directed once a week. 2 mL 2   EPINEPHrine 0.3 mg/0.3 mL IJ SOAJ injection Inject 0.3 mg into the muscle as needed for anaphylaxis.      fluconazole (DIFLUCAN) 150 MG  tablet Take 150 mg by mouth every 3 (three) days.     Fluocinolone Acetonide Body 0.01 % OIL fluocinolone 0.01 % scalp oil and shower cap  APPLY SMALL AMOUNT TOPICALLY TO THE AFFECTED AREA 2 TO 3 TIMES WEEKLY AS DIRECTED     Fluocinolone Acetonide Scalp 0.01 % OIL      fluticasone (FLONASE) 50 MCG/ACT nasal spray Place 1 spray into both nostrils daily as needed (for nasal congestion/sinus infection).      glucose blood test strip 1 each by Other route as needed for other. Use as instructed to test blood sugar  3 times daily E11.65 100 each 11   insulin degludec (TRESIBA FLEXTOUCH) 100 UNIT/ML FlexTouch Pen Inject 24 Units into the skin daily. 30 mL 3   Insulin Pen Needle (NOVOFINE PLUS PEN NEEDLE) 32G X 4 MM MISC  Use as directed  daily E11.65 100 each 2   ipratropium (ATROVENT) 0.06 % nasal spray Place 2 sprays into both nostrils 4 (four) times daily.     levocetirizine (XYZAL) 5 MG tablet 1 tablet in the evening     lisinopril (ZESTRIL) 5 MG tablet Take 2.5 mg by mouth daily.     montelukast (SINGULAIR) 10 MG tablet Take 10 mg by mouth at bedtime. Reported on 02/16/2016     ondansetron (ZOFRAN ODT) 4 MG disintegrating tablet Take 1 tablet (4 mg total) by mouth every 8 (eight) hours as needed for nausea or vomiting. 20 tablet 0   ondansetron (ZOFRAN) 4 MG tablet Take 4 mg by mouth as needed for nausea/vomiting.     pioglitazone (ACTOS) 15 MG tablet Take 1 tablet (15 mg total) by mouth daily. 90 tablet 3   pregabalin (LYRICA) 100 MG capsule TAKE 1 CAPSULE(100 MG) BY MOUTH THREE TIMES DAILY (Patient taking differently: Take 100 mg by mouth 3 (three) times daily.) 90 capsule 0   rivaroxaban (XARELTO) 20 MG TABS tablet Take 20 mg by mouth at bedtime.     SYMBICORT 160-4.5 MCG/ACT inhaler Inhale 2 puffs into the lungs 2 (two) times daily.     terconazole (TERAZOL 7) 0.4 % vaginal cream SMARTSIG:1 Applicator Vaginal Daily     topiramate (TOPAMAX) 25 MG tablet Take 1 tablet (25 mg total) by mouth at bedtime. 90 tablet 3   triamcinolone (NASACORT) 55 MCG/ACT AERO nasal inhaler 1 spray in each nostril     No current facility-administered medications on file prior to visit.     Observations/Objective:   Vitals:   06/29/21 1048  Weight: 151 lb (68.5 kg)  Height: 5' (1.524 m)   GEN:  The patient appears stated age and is in NAD.  Neurological examination: Patient is awake, alert. No aphasia or dysarthria. Intact fluency and comprehension. Cranial nerves: Extraocular movements intacts. No facial asymmetry. Motor: moves all extremities symmetrically, at least anti-gravity x 4.   Assessment and Plan:   This is a 50 yo RH woman with a history of hypertension, hyperlipidemia, thyroid disease, depression, PE on  Xarelto, migraines, neuropathy, with recurrent seizures. She reports a history of nocturnal shaking episodes, as well as episodes of loss of consciousness with post-event right-sided weakness suggestive of focal to bilateral tonic-clonic seizures arising from the left hemisphere. MRI brain and routine EEG unremarkable. She denies any episodes of loss of consciousness since 10/2019, no nocturnal shaking episodes in over a year. She is on low dose Topiramate 25mg  qhs for migraine and seizure prophylaxis, refills sent. She is also on Lyrica 100mg  TID for neuropathy prescribed  by her PCP. She is aware of [place] driving laws to stop driving afte a seizure until 6 months seizure-free. Follow-up in 6-8 months, call for any changes.    Follow Up Instructions:    -I discussed the assessment and treatment plan with the patient. The patient was provided an opportunity to ask questions and all were answered. The patient agreed with the plan and demonstrated an understanding of the instructions.   The patient was advised to call back or seek an in-person evaluation if the symptoms worsen or if the condition fails to improve as anticipated.      Cameron Sprang, MD  CC: Makayla Leriche, PA-C

## 2021-06-29 NOTE — Patient Instructions (Signed)
Good to see you! Refills for Topiramate 25mg  at bedtime have been sent to [facility]. Follow-up in 6-8 months, call for any changes.    Seizure Precautions: 1. If medication has been prescribed for you to prevent seizures, take it exactly as directed.  Do not stop taking the medicine without talking to your doctor first, even if you have not had a seizure in a long time.   2. Avoid activities in which a seizure would cause danger to yourself or to others.  Don't operate dangerous machinery, swim alone, or climb in high or dangerous places, such as on ladders, roofs, or girders.  Do not drive unless your doctor says you may.  3. If you have any warning that you may have a seizure, lay down in a safe place where you can't hurt yourself.    4.  No driving for 6 months from last seizure, as per [facility].   Please refer to the following link on the [facility] website for more information: http://[email or web address].org/[email or web address]/driving/drivingu.cfm   5.  Maintain good sleep hygiene. Avoid alcohol  6.  Notify your neurology if you are planning pregnancy or if you become pregnant.  7.  Contact your doctor if you have any problems that may be related to the medicine you are taking.  8.  Call 911 and bring the patient back to the ED if:        A.  The seizure lasts longer than 5 minutes.       B.  The patient doesn't awaken shortly after the seizure  C.  The patient has new problems such as difficulty seeing, speaking or moving  D.  The patient was injured during the seizure  E.  The patient has a temperature over 102 F (39C)  F.  The patient vomited and now is having trouble breathing

## 2021-07-04 ENCOUNTER — Ambulatory Visit: Payer: Medicare Other | Attending: Internal Medicine

## 2021-07-04 DIAGNOSIS — R1900 Intra-abdominal and pelvic swelling, mass and lump, unspecified site: Secondary | ICD-10-CM | POA: Diagnosis not present

## 2021-07-04 DIAGNOSIS — M5416 Radiculopathy, lumbar region: Secondary | ICD-10-CM | POA: Insufficient documentation

## 2021-07-04 DIAGNOSIS — Z23 Encounter for immunization: Secondary | ICD-10-CM

## 2021-07-04 NOTE — Progress Notes (Signed)
   Covid-19 Vaccination Clinic  Name:  Makayla Frazier    MRN: [id number] DOB: 1971/09/14  07/04/2021  [name] was observed post Covid-19 immunization for 15 minutes without incident. She was provided with Vaccine Information Sheet and instruction to access the V-Safe system.   [name] was instructed to call 911 with any severe reactions post vaccine: Difficulty breathing  Swelling of face and throat  A fast heartbeat  A bad rash all over body  Dizziness and weakness   Immunizations Administered     Name Date Dose VIS Date Route   Moderna Covid-19 Booster Vaccine 07/04/2021  1:12 PM 0.25 mL 09/29/2020 Intramuscular   Manufacturer: Moderna   Lot: [id number]   [id number]

## 2021-07-06 DIAGNOSIS — J45909 Unspecified asthma, uncomplicated: Secondary | ICD-10-CM | POA: Diagnosis not present

## 2021-07-06 DIAGNOSIS — Z9109 Other allergy status, other than to drugs and biological substances: Secondary | ICD-10-CM | POA: Diagnosis not present

## 2021-07-06 DIAGNOSIS — E785 Hyperlipidemia, unspecified: Secondary | ICD-10-CM | POA: Diagnosis not present

## 2021-07-06 DIAGNOSIS — D509 Iron deficiency anemia, unspecified: Secondary | ICD-10-CM | POA: Diagnosis not present

## 2021-07-06 DIAGNOSIS — I10 Essential (primary) hypertension: Secondary | ICD-10-CM | POA: Diagnosis not present

## 2021-07-06 DIAGNOSIS — J4531 Mild persistent asthma with (acute) exacerbation: Secondary | ICD-10-CM | POA: Diagnosis not present

## 2021-07-06 DIAGNOSIS — E1142 Type 2 diabetes mellitus with diabetic polyneuropathy: Secondary | ICD-10-CM | POA: Diagnosis not present

## 2021-07-08 ENCOUNTER — Other Ambulatory Visit (HOSPITAL_BASED_OUTPATIENT_CLINIC_OR_DEPARTMENT_OTHER): Payer: Self-pay

## 2021-07-08 MED ORDER — COVID-19 MRNA VACC (MODERNA) 100 MCG/0.5ML IM SUSP
INTRAMUSCULAR | 0 refills | Status: DC
  Filled 2021-07-08: qty 0.25, 1d supply, fill #0

## 2021-07-13 ENCOUNTER — Other Ambulatory Visit: Payer: Self-pay | Admitting: Internal Medicine

## 2021-07-14 DIAGNOSIS — R569 Unspecified convulsions: Secondary | ICD-10-CM | POA: Diagnosis not present

## 2021-07-14 DIAGNOSIS — R1900 Intra-abdominal and pelvic swelling, mass and lump, unspecified site: Secondary | ICD-10-CM | POA: Diagnosis not present

## 2021-07-14 DIAGNOSIS — I10 Essential (primary) hypertension: Secondary | ICD-10-CM | POA: Diagnosis not present

## 2021-07-14 DIAGNOSIS — E119 Type 2 diabetes mellitus without complications: Secondary | ICD-10-CM | POA: Diagnosis not present

## 2021-07-22 DIAGNOSIS — D509 Iron deficiency anemia, unspecified: Secondary | ICD-10-CM | POA: Diagnosis not present

## 2021-07-22 DIAGNOSIS — R55 Syncope and collapse: Secondary | ICD-10-CM | POA: Diagnosis not present

## 2021-07-28 DIAGNOSIS — E785 Hyperlipidemia, unspecified: Secondary | ICD-10-CM | POA: Diagnosis not present

## 2021-07-28 DIAGNOSIS — I10 Essential (primary) hypertension: Secondary | ICD-10-CM | POA: Diagnosis not present

## 2021-07-28 DIAGNOSIS — J45909 Unspecified asthma, uncomplicated: Secondary | ICD-10-CM | POA: Diagnosis not present

## 2021-07-28 DIAGNOSIS — E1165 Type 2 diabetes mellitus with hyperglycemia: Secondary | ICD-10-CM | POA: Diagnosis not present

## 2021-07-28 DIAGNOSIS — E1142 Type 2 diabetes mellitus with diabetic polyneuropathy: Secondary | ICD-10-CM | POA: Diagnosis not present

## 2021-07-28 DIAGNOSIS — D509 Iron deficiency anemia, unspecified: Secondary | ICD-10-CM | POA: Diagnosis not present

## 2021-07-28 DIAGNOSIS — J4531 Mild persistent asthma with (acute) exacerbation: Secondary | ICD-10-CM | POA: Diagnosis not present

## 2021-07-28 DIAGNOSIS — G43009 Migraine without aura, not intractable, without status migrainosus: Secondary | ICD-10-CM | POA: Diagnosis not present

## 2021-08-18 ENCOUNTER — Ambulatory Visit (INDEPENDENT_AMBULATORY_CARE_PROVIDER_SITE_OTHER): Payer: Medicare Other | Admitting: Sports Medicine

## 2021-08-18 ENCOUNTER — Other Ambulatory Visit: Payer: Self-pay

## 2021-08-18 ENCOUNTER — Encounter: Payer: Self-pay | Admitting: Sports Medicine

## 2021-08-18 DIAGNOSIS — M79675 Pain in left toe(s): Secondary | ICD-10-CM

## 2021-08-18 DIAGNOSIS — E1165 Type 2 diabetes mellitus with hyperglycemia: Secondary | ICD-10-CM

## 2021-08-18 DIAGNOSIS — R0989 Other specified symptoms and signs involving the circulatory and respiratory systems: Secondary | ICD-10-CM | POA: Diagnosis not present

## 2021-08-18 DIAGNOSIS — M79674 Pain in right toe(s): Secondary | ICD-10-CM

## 2021-08-18 DIAGNOSIS — E1140 Type 2 diabetes mellitus with diabetic neuropathy, unspecified: Secondary | ICD-10-CM | POA: Insufficient documentation

## 2021-08-18 DIAGNOSIS — T887XXA Unspecified adverse effect of drug or medicament, initial encounter: Secondary | ICD-10-CM | POA: Diagnosis not present

## 2021-08-18 DIAGNOSIS — B351 Tinea unguium: Secondary | ICD-10-CM | POA: Diagnosis not present

## 2021-08-18 DIAGNOSIS — Z8669 Personal history of other diseases of the nervous system and sense organs: Secondary | ICD-10-CM | POA: Insufficient documentation

## 2021-08-18 NOTE — Progress Notes (Signed)
Subjective: Makayla Frazier is a 50 y.o. female patient with history of diabetes who presents to office today complaining of long,mildly painful nails  while ambulating in shoes; unable to trim. Patient states that the glucose reading this morning was not recorded by last A1c elevated, previous history over 12.  Patient reports that she is going to see an endocrinologist to get a Dexcom device to help her keep track of her sugars.  Patient denies any new changes in medication or new problems.  Patient Active Problem List   Diagnosis Date Noted   Diabetic neuropathy ([place]) 08/18/2021   History of epilepsy 08/18/2021   Lumbar radiculopathy 07/04/2021   Pelvic mass 07/04/2021   Type 2 diabetes mellitus with hyperglycemia, with long-term current use of insulin ([place]) 06/09/2021   Dyslipidemia 06/09/2021   Acanthosis nigricans 05/12/2021   Allergic rhinitis due to pollen 05/12/2021   Fear of flying 05/12/2021   Hemorrhoids 05/12/2021   History of 2019 novel coronavirus disease (COVID-19) 05/12/2021   Iron deficiency anemia 05/12/2021   Leukocytosis 05/12/2021   Migraine without aura, not refractory 05/12/2021   Mild persistent asthma with (acute) exacerbation 05/12/2021   Other allergy status, other than to drugs and biological substances 05/12/2021   Polyneuropathy due to type 2 diabetes mellitus ([place]) 05/12/2021   Sciatica 05/12/2021   Low back pain 04/26/2021   Mass of psoas muscle 04/26/2021   DM (diabetes mellitus) ([place])    Obesity    Stroke ([place])    Hypertension    Herpes simplex type 2 infection 10/14/2020   Type 2 diabetes mellitus with hyperglycemia, without long-term current use of insulin ([place]) 12/25/2019   Type 2 diabetes mellitus with diabetic neuropathy, with long-term current use of insulin ([place]) 12/25/2019   Transient loss of consciousness 11/09/2019   Weakness of right leg 11/09/2019   Multiple idiopathic pulmonary cysts 11/09/2019   Hypertensive urgency 11/09/2019    Hyperlipidemia associated with type 2 diabetes mellitus ([place]) 06/27/2018   GAD (generalized anxiety disorder) 08/21/2016   Moderate single current episode of major depressive disorder ([place]) 08/21/2016   Reaction to severe stress 03/07/2016   Stress 03/07/2016   Bleeding per rectum 11/29/2015   Acute pansinusitis 10/20/2015   Candida vaginitis 10/20/2015   Cerebrovascular accident, late effects 08/30/2015   D (diarrhea) 08/02/2015   Loose bowel movement 08/02/2015   Seizure disorder ([place]) 07/13/2015   Adnexal pain 06/08/2015   Right flank pain 05/30/2015   Chronic pulmonary embolism ([place]) 05/14/2015   Long term current use of anticoagulant 05/14/2015   Seizures ([place]) 05/07/2014   Absence of bladder continence 03/27/2014   Arthropathia 02/25/2014   Chronic chest pain 02/25/2014   Healed or old pulmonary embolism 02/25/2014   HLD (hyperlipidemia) 02/25/2014   Headache, migraine 02/25/2014   Obstructive apnea 02/25/2014   Uncontrolled diabetes mellitus with hyperglycemia ([place]) 02/25/2014   Incontinence 02/13/2014   S/P vaginal hysterectomy 01/29/2013   TIA (transient ischemic attack) 08/03/2012   Left-sided weakness 08/03/2012   Hypokalemia 08/03/2012   Chest pain 08/03/2012   Temporary cerebral vascular dysfunction 08/03/2012   History of pulmonary embolism 05/18/2012   Symptomatic mammary hypertrophy 10/12/2011   Patellar tendinitis of left knee 08/21/2011   Knee pain, left 07/07/2011   PRURITUS 07/04/2010   Syncope and collapse 10/18/2009   Allergic rhinitis 08/11/2009   Unspecified vitamin D deficiency 07/21/2009   Adiposity 07/19/2009   Apnea, sleep 07/19/2009   ANEMIA 07/10/2008   Essential hypertension 07/08/2008   Insulin dependent diabetes mellitus  with complications ([place]) [id number]   Peripheral neuropathy 01/22/2008   Asthma 01/22/2008   Mononeuritis 01/22/2008   Current Outpatient Medications on File Prior to Visit  Medication Sig Dispense Refill    acetaminophen (TYLENOL) 500 MG tablet Take 1,000 mg by mouth every 6 (six) hours as needed for moderate pain.      albuterol (PROVENTIL HFA;VENTOLIN HFA) 108 (90 BASE) MCG/ACT inhaler Inhale 2 puffs into the lungs every 6 (six) hours as needed for wheezing or shortness of breath.     ALPRAZolam (XANAX) 0.5 MG tablet alprazolam 0.5 mg tablet  TAKE ONE TABLET BY MOUTH AS NEEDED FOR flight     amitriptyline (ELAVIL) 25 MG tablet 1 tablet at bedtime.     amLODipine (NORVASC) 5 MG tablet Take 2 tablets (10 mg total) by mouth daily. (Patient taking differently: Take 10 mg by mouth at bedtime.) 90 tablet 3   amoxicillin-clavulanate (AUGMENTIN) 875-125 MG tablet 1 tablet     atorvastatin (LIPITOR) 10 MG tablet Take 10 mg by mouth at bedtime.     benzonatate (TESSALON) 100 MG capsule 1 capsule as needed     cetirizine (ZYRTEC) 10 MG tablet Take 1 tablet (10 mg total) by mouth daily. (Patient taking differently: Take 10 mg by mouth at bedtime.) 90 tablet 3   chlorthalidone (HYGROTON) 25 MG tablet TAKE 2 TABLETS(50 MG) BY MOUTH DAILY (Patient taking differently: Take 50 mg by mouth at bedtime.) 180 tablet 0   Continuous Blood Gluc Sensor (FREESTYLE LIBRE 2 SENSOR) MISC 1 Device by Does not apply route as directed. Change every 14 days 6 each 3   COVID-19 mRNA vaccine, Moderna, 100 MCG/0.5ML injection Inject into the muscle. 0.25 mL 0   cyclobenzaprine (FLEXERIL) 10 MG tablet cyclobenzaprine 10 mg tablet     cyclobenzaprine (FLEXERIL) 5 MG tablet cyclobenzaprine 5 mg tablet  Take 1 tablet twice a day by oral route as needed for 20 days.     EPINEPHrine 0.3 mg/0.3 mL IJ SOAJ injection Inject 0.3 mg into the muscle as needed for anaphylaxis.      EPINEPHrine 0.3 mg/0.3 mL IJ SOAJ injection See admin instructions.     fluconazole (DIFLUCAN) 150 MG tablet Take 150 mg by mouth every 3 (three) days.     Fluocinolone Acetonide Body 0.01 % OIL fluocinolone 0.01 % scalp oil and shower cap  APPLY SMALL AMOUNT  TOPICALLY TO THE AFFECTED AREA 2 TO 3 TIMES WEEKLY AS DIRECTED     Fluocinolone Acetonide Scalp 0.01 % OIL      fluticasone (FLONASE) 50 MCG/ACT nasal spray Place 1 spray into both nostrils daily as needed (for nasal congestion/sinus infection).      glucose blood test strip 1 each by Other route as needed for other. Use as instructed to test blood sugar  3 times daily E11.65 100 each 11   insulin aspart (NOVOLOG FLEXPEN) 100 UNIT/ML FlexPen 8 units     insulin degludec (TRESIBA FLEXTOUCH) 100 UNIT/ML FlexTouch Pen Inject 24 Units into the skin daily. 30 mL 3   Insulin Pen Needle (NOVOFINE PLUS PEN NEEDLE) 32G X 4 MM MISC Use as directed  daily E11.65 100 each 2   ipratropium (ATROVENT) 0.06 % nasal spray Place 2 sprays into both nostrils 4 (four) times daily.     levocetirizine (XYZAL) 5 MG tablet 1 tablet in the evening     lisinopril (ZESTRIL) 40 MG tablet 1 tablet     lisinopril (ZESTRIL) 5 MG tablet Take 2.5 mg by mouth  daily.     montelukast (SINGULAIR) 10 MG tablet Take 10 mg by mouth at bedtime. Reported on 02/16/2016     ondansetron (ZOFRAN ODT) 4 MG disintegrating tablet Take 1 tablet (4 mg total) by mouth every 8 (eight) hours as needed for nausea or vomiting. 20 tablet 0   ondansetron (ZOFRAN) 4 MG tablet Take 4 mg by mouth as needed for nausea/vomiting.     pioglitazone (ACTOS) 15 MG tablet Take 1 tablet (15 mg total) by mouth daily. 90 tablet 3   predniSONE (DELTASONE) 10 MG tablet 2 tablets     pregabalin (LYRICA) 100 MG capsule TAKE 1 CAPSULE(100 MG) BY MOUTH THREE TIMES DAILY (Patient taking differently: Take 100 mg by mouth 3 (three) times daily.) 90 capsule 0   rivaroxaban (XARELTO) 20 MG TABS tablet Take 20 mg by mouth at bedtime.     SYMBICORT 160-4.5 MCG/ACT inhaler Inhale 2 puffs into the lungs 2 (two) times daily.     terconazole (TERAZOL 7) 0.4 % vaginal cream SMARTSIG:1 Applicator Vaginal Daily     topiramate (TOPAMAX) 25 MG tablet Take 1 tablet (25 mg total) by mouth at  bedtime. 90 tablet 3   topiramate (TOPAMAX) 50 MG tablet 1 tablet     triamcinolone (NASACORT) 55 MCG/ACT AERO nasal inhaler 1 spray in each nostril     TRULICITY 3 0000000 SOPN INJECT THREE MG AS DIRECTED ONCE A WEEK 2 mL 2   No current facility-administered medications on file prior to visit.   Allergies  Allergen Reactions   Silver Other (See Comments) and Rash   Canagliflozin Other (See Comments)   Codeine Nausea And Vomiting    Pt takes Percocet without problems    Darvocet [Propoxyphene N-Acetaminophen] Nausea And Vomiting   Glipizide     Diarrhea and abdominal pain    Hydrocodone-Acetaminophen Nausea And Vomiting   Lantus [Insulin Glargine]     Burning at injection site    Liraglutide     Other reaction(s): upset stomach   Metformin Hcl     Other reaction(s): diarrhea   Propoxyphene     Other reaction(s): nausea   Hydrocodone-Acetaminophen Nausea And Vomiting    Vomiting   Latex Rash   Tape Itching and Rash   Tramadol Nausea Only    Recent Results (from the past 2160 hour(s))  Lipid panel     Status: Abnormal   Collection Time: 06/09/21  8:13 AM  Result Value Ref Range   Cholesterol 145 0 - 200 mg/dL    Comment: ATP III Classification       Desirable:  < 200 mg/dL               Borderline High:  200 - 239 mg/dL          High:  > = 240 mg/dL   Triglycerides 189.0 (H) 0.0 - 149.0 mg/dL    Comment: Normal:  <150 mg/dLBorderline High:  150 - 199 mg/dL   HDL 42.40 >39.00 mg/dL   VLDL 37.8 0.0 - 40.0 mg/dL   LDL Cholesterol 65 0 - 99 mg/dL   Total CHOL/HDL Ratio 3     Comment:                Men          Women1/2 Average Risk     3.4          3.3Average Risk          5.0  4.42X Average Risk          9.6          7.13X Average Risk          15.0          11.0                       NonHDL 102.51     Comment: NOTE:  Non-HDL goal should be 30 mg/dL higher than patient's LDL goal (i.e. LDL goal of < 70 mg/dL, would have non-HDL goal of < 100 mg/dL)  Hemoglobin A1c      Status: Abnormal   Collection Time: 06/09/21  8:13 AM  Result Value Ref Range   Hgb A1c MFr Bld 14.5 (H) 4.6 - 6.5 %    Comment: Glycemic Control Guidelines for People with Diabetes:Non Diabetic:  <6%Goal of Therapy: <7%Additional Action Suggested:  >8%   Microalbumin / creatinine urine ratio     Status: Abnormal   Collection Time: 06/09/21  8:13 AM  Result Value Ref Range   Microalb, Ur 3.4 (H) 0.0 - 1.9 mg/dL   Creatinine,U 278.7 mg/dL   Microalb Creat Ratio 1.2 0.0 - 30.0 mg/g  Basic metabolic panel     Status: Abnormal   Collection Time: 06/09/21  8:13 AM  Result Value Ref Range   Sodium 138 135 - 145 mEq/L   Potassium 3.0 (L) 3.5 - 5.1 mEq/L   Chloride 98 96 - 112 mEq/L   CO2 32 19 - 32 mEq/L   Glucose, Bld 265 (H) 70 - 99 mg/dL   BUN 14 6 - 23 mg/dL   Creatinine, Ser 1.09 0.40 - 1.20 mg/dL   GFR 59.61 (L) >60.00 mL/min    Comment: Calculated using the CKD-EPI Creatinine Equation (2021)   Calcium 9.4 8.4 - 10.5 mg/dL    Objective: General: Patient is awake, alert, and oriented x 3 and in no acute distress.  Integument: Skin is warm, dry and supple bilateral. Nails are tender, long, thickened and dystrophic with subungual debris, consistent with onychomycosis, 1-5 bilateral. No signs of infection. No open lesions or preulcerative lesions present bilateral. Remaining integument unremarkable.  Vasculature:  Dorsalis Pedis pulse 2/4 bilateral. Posterior Tibial pulse  2/4 bilateral.  Capillary fill time <3 sec 1-5 bilateral. Positive hair growth to the level of the digits. Temperature gradient within normal limits. No varicosities present bilateral. No edema present bilateral.   Neurology: The patient has intact sensation measured with a 5.07/10g Semmes Weinstein Monofilament at all pedal sites bilateral . Vibratory sensation diminished bilateral with tuning fork. No Babinski sign present bilateral.  Unchanged from prior.  Musculoskeletal: No symptomatic pedal deformities  noted bilateral.  Bunion deformity noted but currently asymptomatic.  Muscular strength 5/5 in all lower extremity muscular groups bilateral without pain on range of motion . No tenderness with calf compression bilateral.  Assessment and Plan: Problem List Items Addressed This Visit       Endocrine   Type 2 diabetes mellitus with hyperglycemia, without long-term current use of insulin ([facility])   Relevant Medications   insulin aspart (NOVOLOG FLEXPEN) 100 UNIT/ML FlexPen   lisinopril (ZESTRIL) 40 MG tablet   Other Visit Diagnoses     Pain due to onychomycosis of toenails of both feet    -  Primary       -Examined patient. -Discussed and educated patient again on diabetic foot care and the importance of glycemic control. -Mechanically debrided all nails 1-5  bilateral using sterile nail nipper and filed with dremel without incident  -Answered all patient questions -Patient to return  in 3 months for at risk foot care -Patient advised to call the office if any problems or questions arise in the meantime.  Landis Martins, DPM

## 2021-08-19 ENCOUNTER — Telehealth: Payer: Self-pay | Admitting: Internal Medicine

## 2021-08-19 NOTE — Telephone Encounter (Signed)
Called and spoke with patient she said that she told stop for couple of days  , pt stopped Actos last night.because she was getting yeast infection and sinus infection, and that PCP believes that  its coming from the Actos. PCP doesn't want to keep giving her any more antibiotics.

## 2021-08-19 NOTE — Telephone Encounter (Signed)
Patient notified and verbalized understanding. 

## 2021-08-19 NOTE — Telephone Encounter (Signed)
Patient requests to be called at ph# [phone] re: Patient states her PCP told her to stop taking Actos. Patient states she stopped taking Actos yesterday and would like to discuss.

## 2021-08-25 DIAGNOSIS — E785 Hyperlipidemia, unspecified: Secondary | ICD-10-CM | POA: Diagnosis not present

## 2021-08-25 DIAGNOSIS — D509 Iron deficiency anemia, unspecified: Secondary | ICD-10-CM | POA: Diagnosis not present

## 2021-08-25 DIAGNOSIS — J4531 Mild persistent asthma with (acute) exacerbation: Secondary | ICD-10-CM | POA: Diagnosis not present

## 2021-08-25 DIAGNOSIS — E1165 Type 2 diabetes mellitus with hyperglycemia: Secondary | ICD-10-CM | POA: Diagnosis not present

## 2021-08-25 DIAGNOSIS — I10 Essential (primary) hypertension: Secondary | ICD-10-CM | POA: Diagnosis not present

## 2021-08-25 DIAGNOSIS — G43009 Migraine without aura, not intractable, without status migrainosus: Secondary | ICD-10-CM | POA: Diagnosis not present

## 2021-08-25 DIAGNOSIS — J45909 Unspecified asthma, uncomplicated: Secondary | ICD-10-CM | POA: Diagnosis not present

## 2021-08-25 DIAGNOSIS — E1142 Type 2 diabetes mellitus with diabetic polyneuropathy: Secondary | ICD-10-CM | POA: Diagnosis not present

## 2021-09-09 ENCOUNTER — Encounter: Payer: Self-pay | Admitting: Internal Medicine

## 2021-09-09 ENCOUNTER — Other Ambulatory Visit: Payer: Self-pay

## 2021-09-09 ENCOUNTER — Ambulatory Visit (INDEPENDENT_AMBULATORY_CARE_PROVIDER_SITE_OTHER): Payer: Medicare Other | Admitting: Internal Medicine

## 2021-09-09 VITALS — BP 122/80 | HR 74 | Ht 60.0 in | Wt 156.8 lb

## 2021-09-09 DIAGNOSIS — Z23 Encounter for immunization: Secondary | ICD-10-CM

## 2021-09-09 DIAGNOSIS — Z794 Long term (current) use of insulin: Secondary | ICD-10-CM

## 2021-09-09 DIAGNOSIS — E1165 Type 2 diabetes mellitus with hyperglycemia: Secondary | ICD-10-CM

## 2021-09-09 DIAGNOSIS — E1140 Type 2 diabetes mellitus with diabetic neuropathy, unspecified: Secondary | ICD-10-CM | POA: Diagnosis not present

## 2021-09-09 LAB — POCT GLYCOSYLATED HEMOGLOBIN (HGB A1C): Hemoglobin A1C: 11.4 % — AB (ref 4.0–5.6)

## 2021-09-09 MED ORDER — PIOGLITAZONE HCL 30 MG PO TABS: 30.0000 mg | ORAL_TABLET | Freq: Every day | ORAL | 3 refills | Status: DC

## 2021-09-09 NOTE — Progress Notes (Signed)
Name: Makayla Frazier  Age/ Sex: 50 y.o., female   MRN/ DOB: [id number], 1971-08-03     PCP: Perlie Mayo Durel Salts   Reason for Endocrinology Evaluation: Type 2 Diabetes Mellitus  Initial Endocrine Consultative Visit:  12/25/2019    PATIENT IDENTIFIER: [name] is a 50 y.o. female with a past medical history of T2DM, HTN and seizure disorder . The patient has followed with Endocrinology clinic since 12/25/2019 for consultative assistance with management of her diabetes.  DIABETIC HISTORY:  Makayla Frazier was diagnosed with T2DM 2009.  She was initially on glipizide and Metformin, but developed diarrhea.  She is intolerant to Victoza due to rash.  Invokana because genital infections. Her hemoglobin A1c has ranged from  7.0% in 2011, peaking at 11.3% in 3013.   On her initial visit to our clinic she had an A1c of 3.7%, she was on Trulicity only.  Glipizide was added  Glipizide switched to basal insulin in 03/2020 due to worsening hyperglycemia.    Pioglitazone started 05/2021  SUBJECTIVE:   During the last visit (06/09/2021): A1c 14.5%, we increased insulin, started Actos and continued Trulicity    Today (06/07/3150): Makayla Frazier is here for a follow up on diabetes management.  She checked her blood sugars 0 times a day. The patient is not known to have hypoglycemic episodes since the last clinic visit.  Has been having issues with sinus , was on Abx but no steroids.  She has been on 1.5 mg weekly of Trulicity  instead of 3 mg. This was changed in a month   Denies nausea or diarrhea   Follows with [facility]   She need bunion sx but pending glycemic optimization  She is also pending sx for psoas muscle mass    HOME DIABETES REGIMEN:  Tresiba 24 units daily  Trulicity 3 mg weekly (Fridays)  Pioglitazone 15 mg daily     Statin: Yes ACE-I/ARB: Yes     CONTINUOUS GLUCOSE MONITORING RECORD INTERPRETATION    Dates of Recording: 9/17-9/30/2022  Sensor  description:freestyle libre  Results statistics:   CGM use % of time 78  Average and SD 159/24.7  Time in range    72    %  % Time Above 180 26  % Time above 250 2  % Time Below target 0     Glycemic patterns summary: hyperglycemia during the day and optimal at night   Hyperglycemic episodes  postprandial  Hypoglycemic episodes occurred n/a  Overnight periods: trends down      DIABETIC COMPLICATIONS: Microvascular complications:  Neuropathy Denies: CKD , retinopathy Last eye exam: Completed 2022   Macrovascular complications:  TIA Denies: CAD, PVD, CVA  HISTORY:  Past Medical History:  Past Medical History:  Diagnosis Date   Anemia    Arthritis    Left leg   Asthma    BV (bacterial vaginosis) 7616   Complication of anesthesia    Seizures in PACU after surgery 3/15   DM (diabetes mellitus) ([place])    diagnosed 2009   DVT (deep venous thrombosis) ([place]) 2011   pt had IUD; also 05/2012   Dysfunctional uterine bleeding    Seen by Dr. Leo Grosser, GYN; pending hysterectomy as of 07/2012   H/O hypercoagulable state    2/2 contraception   Headache(784.0)    migraines   Herpes simplex without mention of complication 0/7371   HSV-2   HLD (hyperlipidemia)    Hypertension    Labial lesion 2013  Major depression    Hx suicide attempt in 2009, [facility] admissions   Mastodynia    Neuromuscular disorder ([place])    neuropathy   Neuropathy    Obesity    Oligomenorrhea 2011   PE (pulmonary embolism)    2009 - on oral contraception; multiple   Post - coital bleeding 2012   Seizures ([place]) 2015   last episode May 2015   Sleep apnea    Status post placement of implantable loop recorder    Stroke ([place])    TIA 2013   Syncopal episodes    unknown etiology   Thyroid disease    hypothyroidism (pt denies)   Vaginal Pap smear, abnormal    Venous insufficiency    Vitamin D deficiency    unknown to pt.   Past Surgical History:  Past Surgical History:  Procedure Laterality  Date   BILATERAL SALPINGECTOMY  12/18/2012   Procedure: BILATERAL SALPINGECTOMY;  Surgeon: Eldred Manges, MD;  Location: [facility];  Service: Gynecology;  Laterality: Bilateral;   BLADDER SUSPENSION N/A 02/13/2014   Procedure: TRANSVAGINAL TAPE (TVT) PROCEDURE;  Surgeon: Delice Lesch, MD;  Location: [facility];  Service: Gynecology;  Laterality: N/A;   BREAST REDUCTION SURGERY     Dr. Eugene Garnet [place] [facility]     Implantable Loop recorder; no arrhythmias associated with synocpal spells; loop explanted 07-2013   CHOLECYSTECTOMY     DILATATION & CURRETTAGE/HYSTEROSCOPY WITH RESECTOCOPE  2007 & 2013   ENDOMETRIAL BIOPSY  2011   IUD REMOVAL  12/18/2012   Procedure: INTRAUTERINE DEVICE (IUD) REMOVAL;  Surgeon: Eldred Manges, MD;  Location: [facility];  Service: Gynecology;  Laterality: N/A;  Removed during prep by E. Powell PA   LAPAROSCOPIC ASSISTED VAGINAL HYSTERECTOMY  12/18/2012   Procedure: LAPAROSCOPIC ASSISTED VAGINAL HYSTERECTOMY;  Surgeon: Eldred Manges, MD;  Location: [facility];  Service: Gynecology;  Laterality: N/A;   LOOP RECORDER EXPLANT N/A 07/17/2013   Procedure: LOOP RECORDER EXPLANT;  Surgeon: Thompson Grayer, MD;  Location: [facility];  Service: Cardiovascular;  Laterality: N/A;   TRANSVAGINAL TAPE (TVT) REMOVAL N/A 04/28/2014   Procedure: Removal of suburethral mesh;  Surgeon: Delice Lesch, MD;  Location: [facility];  Service: Gynecology;  Laterality: N/A;   WISDOM TOOTH EXTRACTION     Social History:  reports that she has never smoked. She has never used smokeless tobacco. She reports current alcohol use. She reports that she does not use drugs. Family History:  Family History  Problem Relation Age of Onset   Melanoma Father 26   Diabetes Father    Hypertension Father    Kidney disease Father    Ulcerative colitis Mother 21   Diabetes Mother    Hypertension Mother    Breast cancer Paternal Grandmother    Cancer Paternal Grandmother    Diabetes  type II Maternal Grandmother        deceased 17   Diabetes Maternal Grandmother    Pulmonary embolism Paternal Grandfather    Stroke Maternal Grandfather      HOME MEDICATIONS: Allergies as of 09/09/2021       Reactions   Silver Other (See Comments), Rash   Canagliflozin Other (See Comments)   Codeine Nausea And Vomiting   Pt takes Percocet without problems   Darvocet [propoxyphene N-acetaminophen] Nausea And Vomiting   Glipizide    Diarrhea and abdominal pain    Hydrocodone-acetaminophen Nausea And Vomiting   Lantus [insulin Glargine]  Burning at injection site    Liraglutide    Other reaction(s): upset stomach   Metformin Hcl    Other reaction(s): diarrhea   Propoxyphene    Other reaction(s): nausea   Hydrocodone-acetaminophen Nausea And Vomiting   Vomiting   Latex Rash   Tape Itching, Rash   Tramadol Nausea Only        Medication List        Accurate as of September 09, 2021  8:17 AM. If you have any questions, ask your nurse or doctor.          STOP taking these medications    fluconazole 150 MG tablet Commonly known as: DIFLUCAN Stopped by: Dorita Sciara, MD       TAKE these medications    acetaminophen 500 MG tablet Commonly known as: TYLENOL Take 1,000 mg by mouth every 6 (six) hours as needed for moderate pain.   albuterol 108 (90 Base) MCG/ACT inhaler Commonly known as: VENTOLIN HFA Inhale 2 puffs into the lungs every 6 (six) hours as needed for wheezing or shortness of breath.   ALPRAZolam 0.5 MG tablet Commonly known as: XANAX alprazolam 0.5 mg tablet  TAKE ONE TABLET BY MOUTH AS NEEDED FOR flight   amitriptyline 25 MG tablet Commonly known as: ELAVIL 1 tablet at bedtime.   amLODipine 5 MG tablet Commonly known as: NORVASC Take 2 tablets (10 mg total) by mouth daily. What changed: when to take this   amoxicillin-clavulanate 875-125 MG tablet Commonly known as: AUGMENTIN 1 tablet   atorvastatin 10 MG  tablet Commonly known as: LIPITOR Take 10 mg by mouth at bedtime.   benzonatate 100 MG capsule Commonly known as: TESSALON 1 capsule as needed   cetirizine 10 MG tablet Commonly known as: ZYRTEC Take 1 tablet (10 mg total) by mouth daily. What changed: when to take this   chlorthalidone 25 MG tablet Commonly known as: HYGROTON TAKE 2 TABLETS(50 MG) BY MOUTH DAILY What changed: See the new instructions.   cyclobenzaprine 5 MG tablet Commonly known as: FLEXERIL cyclobenzaprine 5 mg tablet  Take 1 tablet twice a day by oral route as needed for 20 days.   cyclobenzaprine 10 MG tablet Commonly known as: FLEXERIL cyclobenzaprine 10 mg tablet   EPINEPHrine 0.3 mg/0.3 mL Soaj injection Commonly known as: EPI-PEN See admin instructions.   EPINEPHrine 0.3 mg/0.3 mL Soaj injection Commonly known as: EPI-PEN Inject 0.3 mg into the muscle as needed for anaphylaxis.   Fluocinolone Acetonide Body 0.01 % Oil fluocinolone 0.01 % scalp oil and shower cap  APPLY SMALL AMOUNT TOPICALLY TO THE AFFECTED AREA 2 TO 3 TIMES WEEKLY AS DIRECTED   Fluocinolone Acetonide Scalp 0.01 % Oil   fluticasone 50 MCG/ACT nasal spray Commonly known as: FLONASE Place 1 spray into both nostrils daily as needed (for nasal congestion/sinus infection).   FreeStyle Libre 2 Sensor Misc 1 Device by Does not apply route as directed. Change every 14 days   glucose blood test strip 1 each by Other route as needed for other. Use as instructed to test blood sugar  3 times daily E11.65   ipratropium 0.06 % nasal spray Commonly known as: ATROVENT Place 2 sprays into both nostrils 4 (four) times daily.   levocetirizine 5 MG tablet Commonly known as: XYZAL 1 tablet in the evening   lisinopril 5 MG tablet Commonly known as: ZESTRIL Take 2.5 mg by mouth daily. What changed: Another medication with the same name was removed. Continue taking this medication, and  follow the directions you see here. Changed by:  Dorita Sciara, MD   Moderna COVID-19 Vaccine 100 MCG/0.5ML injection Generic drug: COVID-19 mRNA vaccine (Moderna) Inject into the muscle.   montelukast 10 MG tablet Commonly known as: SINGULAIR Take 10 mg by mouth at bedtime. Reported on 02/16/2016   NovoFine Plus Pen Needle 32G X 4 MM Misc Generic drug: Insulin Pen Needle Use as directed  daily E11.65   NovoLOG FlexPen 100 UNIT/ML FlexPen Generic drug: insulin aspart 8 units   ondansetron 4 MG disintegrating tablet Commonly known as: Zofran ODT Take 1 tablet (4 mg total) by mouth every 8 (eight) hours as needed for nausea or vomiting.   ondansetron 4 MG tablet Commonly known as: ZOFRAN Take 4 mg by mouth as needed for nausea/vomiting.   pioglitazone 15 MG tablet Commonly known as: Actos Take 1 tablet (15 mg total) by mouth daily.   predniSONE 10 MG tablet Commonly known as: DELTASONE 2 tablets   pregabalin 100 MG capsule Commonly known as: LYRICA TAKE 1 CAPSULE(100 MG) BY MOUTH THREE TIMES DAILY What changed: See the new instructions.   Symbicort 160-4.5 MCG/ACT inhaler Generic drug: budesonide-formoterol Inhale 2 puffs into the lungs 2 (two) times daily.   terconazole 0.4 % vaginal cream Commonly known as: TERAZOL 7 SMARTSIG:1 Applicator Vaginal Daily   topiramate 50 MG tablet Commonly known as: TOPAMAX 1 tablet   topiramate 25 MG tablet Commonly known as: TOPAMAX Take 1 tablet (25 mg total) by mouth at bedtime.   Tyler Aas FlexTouch 100 UNIT/ML FlexTouch Pen Generic drug: insulin degludec Inject 24 Units into the skin daily.   triamcinolone 55 MCG/ACT Aero nasal inhaler Commonly known as: NASACORT 1 spray in each nostril   Trulicity 3 SN/0.5LZ Sopn Generic drug: Dulaglutide INJECT THREE MG AS DIRECTED ONCE A WEEK   Xarelto 20 MG Tabs tablet Generic drug: rivaroxaban Take 20 mg by mouth at bedtime.         OBJECTIVE:   Vital Signs: BP 122/80 (BP Location: Right Arm, Patient Position:  Sitting, Cuff Size: Small)   Pulse 74   Ht 5' (1.524 m)   Wt 156 lb 12.8 oz (71.1 kg)   LMP 11/06/2012   SpO2 99%   BMI 30.62 kg/m   Wt Readings from Last 3 Encounters:  09/09/21 156 lb 12.8 oz (71.1 kg)  06/29/21 151 lb (68.5 kg)  06/09/21 151 lb 9.6 oz (68.8 kg)     Exam: General: Pt appears well and is in NAD  Lungs: Clear with good BS bilat with no rales, rhonchi, or wheezes  Heart: RRR with normal S1 and S2 and no gallops; no murmurs; no rub  Extremities: No pretibial edema.   Neuro: MS is good with appropriate affect, pt is alert and Ox3   DM foot exam: 06/09/2021   The skin of the feet is intact without sores or ulcerations. The pedal pulses are 2+ on right and 2+ on left. The sensation is decreased  to a screening 5.07, 10 gram monofilament bilaterally     DATA REVIEWED:  Lab Results  Component Value Date   HGBA1C 11.4 (A) 09/09/2021   HGBA1C 14.5 (H) 06/09/2021   HGBA1C 12.3 (A) 06/28/2020  Results for Makayla Frazier, Makayla Frazier (MRN [id number]) as of 06/10/2021 15:23  Ref. Range 06/09/2021 08:13  Sodium Latest Ref Range: 135 - 145 mEq/L 138  Potassium Latest Ref Range: 3.5 - 5.1 mEq/L 3.0 (L)  Chloride Latest Ref Range: 96 - 112 mEq/L 98  CO2  Latest Ref Range: 19 - 32 mEq/L 32  Glucose Latest Ref Range: 70 - 99 mg/dL 265 (H)  BUN Latest Ref Range: 6 - 23 mg/dL 14  Creatinine Latest Ref Range: 0.40 - 1.20 mg/dL 1.09  Calcium Latest Ref Range: 8.4 - 10.5 mg/dL 9.4  GFR Latest Ref Range: >60.00 mL/min 59.61 (L)  Total CHOL/HDL Ratio Unknown 3  Cholesterol Latest Ref Range: 0 - 200 mg/dL 145  HDL Cholesterol Latest Ref Range: >39.00 mg/dL 42.40  LDL (calc) Latest Ref Range: 0 - 99 mg/dL 65  MICROALB/CREAT RATIO Latest Ref Range: 0.0 - 30.0 mg/g 1.2  NonHDL Unknown 102.51  Triglycerides Latest Ref Range: 0.0 - 149.0 mg/dL 189.0 (H)  VLDL Latest Ref Range: 0.0 - 40.0 mg/dL 37.8  Hemoglobin A1C Latest Ref Range: 4.6 - 6.5 % 14.5 (H)  Creatinine,U Latest Units: mg/dL  278.7  Microalb, Ur Latest Ref Range: 0.0 - 1.9 mg/dL 3.4 (H)    ASSESSMENT / PLAN / RECOMMENDATIONS:   Type 2 Diabetes Mellitus, Poorly controlled, With neuropathic complications - Most recent A1c of 11.4  %. Goal A1c <7.0%.   - Her A1c has improved down from 14.5%  - She is tolerating Pioglitazone , will increase  -Patient understands that if she would like to proceed with any surgical intervention the A1c goal is<7.5 percent otherwise her risk of infection is very high, we also discussed poor healing process with hyperglycemia  - In review of CGM download 72 % of her glucose readings are at goal , which is great. I have encouraged her to continue current changes  - We discussed limiting carbohydrates with meals as she has been noted with postprandial hyperglycemia > 200 mg/dL    MEDICATIONS:  Continue Tresiba 24 units daily  continue Trulicity 3 mg weekly  Increase pioglitazone 30  mg daily  EDUCATION / INSTRUCTIONS: BG monitoring instructions: Patient is instructed to check her blood sugars 2 times a day, fasting and bedtime  Call [facility] if: BG persistently > 250 I reviewed the Rule of 15 for the treatment of hypoglycemia in detail with the patient. Literature supplied.   2) Diabetic complications:  Eye: Does not have known diabetic retinopathy.  Neuro/ Feet: Does have known diabetic peripheral neuropathy. Renal: Patient does not have known baseline CKD. She is on an ACEI/ARB at present.  F/U in 3 months     Signed electronically by: Mack Guise, MD  [facility]  [facility] [address] [place], [place] [address] Phone: [phone] FAX: [phone]   CC: [address] [place] [address] Phone: [phone]  Fax: [phone]  Return to Endocrinology clinic as below: Future Appointments  Date Time Provider [place]  10/06/2021  3:00 PM  Jerline Pain, MD [facility]  11/07/2021  8:30 AM Garnet Sierras, DO [facility] None  11/17/2021 10:15 AM Landis Martins, DPM [facility]  01/16/2022  8:30 AM Delice Lesch Lezlie Octave, MD [facility] None

## 2021-09-09 NOTE — Patient Instructions (Signed)
-   Keep Up the Good Work ! - Continue Tresiba 24  units once daily  - Continue  trulicity 3 mg weekly  -Increase Actos to 30 mg daily       -HOW TO TREAT LOW BLOOD SUGARS (Blood sugar LESS THAN 70 MG/DL) Please follow the RULE OF 15 for the treatment of hypoglycemia treatment (when your (blood sugars are less than 70 mg/dL)   STEP 1: Take 15 grams of carbohydrates when your blood sugar is low, which includes:  3-4 GLUCOSE TABS  OR 3-4 OZ OF JUICE OR REGULAR SODA OR ONE TUBE OF GLUCOSE GEL    STEP 2: RECHECK blood sugar in 15 MINUTES STEP 3: If your blood sugar is still low at the 15 minute recheck --> then, go back to STEP 1 and treat AGAIN with another 15 grams of carbohydrates.

## 2021-09-12 ENCOUNTER — Encounter: Payer: Self-pay | Admitting: Internal Medicine

## 2021-09-13 MED ORDER — PREGABALIN 100 MG PO CAPS: 100.0000 mg | ORAL_CAPSULE | Freq: Three times a day (TID) | ORAL | 5 refills | Status: DC

## 2021-09-14 ENCOUNTER — Other Ambulatory Visit: Payer: Self-pay | Admitting: Internal Medicine

## 2021-09-14 DIAGNOSIS — I10 Essential (primary) hypertension: Secondary | ICD-10-CM | POA: Diagnosis not present

## 2021-09-14 DIAGNOSIS — G43009 Migraine without aura, not intractable, without status migrainosus: Secondary | ICD-10-CM | POA: Diagnosis not present

## 2021-09-14 DIAGNOSIS — J4531 Mild persistent asthma with (acute) exacerbation: Secondary | ICD-10-CM | POA: Diagnosis not present

## 2021-09-14 DIAGNOSIS — D509 Iron deficiency anemia, unspecified: Secondary | ICD-10-CM | POA: Diagnosis not present

## 2021-09-14 DIAGNOSIS — E785 Hyperlipidemia, unspecified: Secondary | ICD-10-CM | POA: Diagnosis not present

## 2021-09-14 DIAGNOSIS — E1165 Type 2 diabetes mellitus with hyperglycemia: Secondary | ICD-10-CM | POA: Diagnosis not present

## 2021-09-14 DIAGNOSIS — J45909 Unspecified asthma, uncomplicated: Secondary | ICD-10-CM | POA: Diagnosis not present

## 2021-09-14 DIAGNOSIS — E1142 Type 2 diabetes mellitus with diabetic polyneuropathy: Secondary | ICD-10-CM | POA: Diagnosis not present

## 2021-09-14 DIAGNOSIS — M19012 Primary osteoarthritis, left shoulder: Secondary | ICD-10-CM | POA: Diagnosis not present

## 2021-09-14 DIAGNOSIS — Z86718 Personal history of other venous thrombosis and embolism: Secondary | ICD-10-CM

## 2021-09-19 DIAGNOSIS — E118 Type 2 diabetes mellitus with unspecified complications: Secondary | ICD-10-CM | POA: Diagnosis not present

## 2021-09-19 DIAGNOSIS — Z794 Long term (current) use of insulin: Secondary | ICD-10-CM | POA: Diagnosis not present

## 2021-09-20 ENCOUNTER — Telehealth: Payer: Self-pay | Admitting: Internal Medicine

## 2021-09-20 NOTE — Telephone Encounter (Signed)
[facility] calling in for a verbal/script for Zarelto for patient. Per Pharm Endo provider will be taking over prescription. Please contact Pharm for any questions [phone]

## 2021-09-20 NOTE — Telephone Encounter (Signed)
Will you be taking over the [place] for patient

## 2021-09-21 NOTE — Telephone Encounter (Signed)
Patient notified that we can fill that medication

## 2021-09-21 NOTE — Telephone Encounter (Signed)
Pharmacy notified as well

## 2021-09-29 DIAGNOSIS — M5410 Radiculopathy, site unspecified: Secondary | ICD-10-CM | POA: Diagnosis not present

## 2021-09-29 DIAGNOSIS — S46812A Strain of other muscles, fascia and tendons at shoulder and upper arm level, left arm, initial encounter: Secondary | ICD-10-CM | POA: Diagnosis not present

## 2021-10-03 ENCOUNTER — Other Ambulatory Visit: Payer: Self-pay | Admitting: Internal Medicine

## 2021-10-06 ENCOUNTER — Ambulatory Visit (INDEPENDENT_AMBULATORY_CARE_PROVIDER_SITE_OTHER): Payer: Medicare Other | Admitting: Cardiology

## 2021-10-06 ENCOUNTER — Other Ambulatory Visit: Payer: Self-pay

## 2021-10-06 ENCOUNTER — Encounter: Payer: Self-pay | Admitting: Cardiology

## 2021-10-06 ENCOUNTER — Telehealth: Payer: Self-pay | Admitting: Internal Medicine

## 2021-10-06 VITALS — BP 100/60 | HR 89 | Ht 60.0 in | Wt 158.0 lb

## 2021-10-06 DIAGNOSIS — I10 Essential (primary) hypertension: Secondary | ICD-10-CM

## 2021-10-06 DIAGNOSIS — I2609 Other pulmonary embolism with acute cor pulmonale: Secondary | ICD-10-CM

## 2021-10-06 DIAGNOSIS — R55 Syncope and collapse: Secondary | ICD-10-CM

## 2021-10-06 DIAGNOSIS — R002 Palpitations: Secondary | ICD-10-CM | POA: Diagnosis not present

## 2021-10-06 DIAGNOSIS — I2782 Chronic pulmonary embolism: Secondary | ICD-10-CM

## 2021-10-06 DIAGNOSIS — M542 Cervicalgia: Secondary | ICD-10-CM | POA: Diagnosis not present

## 2021-10-06 NOTE — Telephone Encounter (Signed)
Patient notified

## 2021-10-06 NOTE — Telephone Encounter (Signed)
Vm left for patient to call back.

## 2021-10-06 NOTE — Patient Instructions (Signed)
Medication Instructions:  The current medical regimen is effective;  continue present plan and medications.  *If you need a refill on your cardiac medications before your next appointment, please call your pharmacy*  Follow-Up: At [facility], you and your health needs are our priority.  As part of our continuing mission to provide you with exceptional heart care, we have created designated Provider Care Teams.  These Care Teams include your primary Cardiologist (physician) and Advanced Practice Providers (APPs -  Physician Assistants and Nurse Practitioners) who all work together to provide you with the care you need, when you need it.  We recommend signing up for the patient portal called "MyChart".  Sign up information is provided on this After Visit Summary.  MyChart is used to connect with patients for Virtual Visits (Telemedicine).  Patients are able to view lab/test results, encounter notes, upcoming appointments, etc.  Non-urgent messages can be sent to your provider as well.   To learn more about what you can do with MyChart, go to [email or web address].    Your next appointment:   1 year(s)  The format for your next appointment:   In Person  Provider:   Mark Skains, MD   Thank you for choosing [facility]!!    

## 2021-10-06 NOTE — Assessment & Plan Note (Signed)
We will go ahead and help her with her Xarelto yearly.  20 mg.  Could consider decreasing to lower dose at some point however if she is not having any bleeding issues, it is fine for her to continue with current dosing.  Last hemoglobin 13.6.

## 2021-10-06 NOTE — Assessment & Plan Note (Signed)
Rare skipped beats were appreciated previously.  Benign.  Reassurance.  EKG today excellent.

## 2021-10-06 NOTE — Telephone Encounter (Signed)
Patient called to advise there another doctor has put her on Prednisone for her arthritis and patient is inquiring about how to manage her Blood Sugar with Prednisone.  Call back # [phone]

## 2021-10-06 NOTE — Progress Notes (Signed)
Cardiology Office Note:    Date:  10/06/2021   ID:  Makayla Frazier, DOB February 22, 1971, MRN [id number]  PCP:  Filiberto Pinks   [facility] Providers Cardiologist:  None     Referring MD: Maude Leriche, PA-C    History of Present Illness:    Makayla Frazier is a 50 y.o. female Here for the evaluation of syncope at the request of Maylon Cos, [place].  She has diabetes hypertension seizure disorder followed by endocrinology.  Denies prior history of CAD.  Had TIA in 2013 on past medical history.  Also had a implantable loop recorder as well with explant in 2014 by Dr. Rayann Heman.  Had a prior DVT in 2011 with IUD.  She was seen by Dr. Rayann Heman in the past loop recorder from 2012 2014 that did not demonstrate an arrhythmia to explain syncope.  She was subsequently diagnosed with seizures and her symptoms improved with antiepileptics.  She had a stress echo in 2016 negative for ischemia.  Has had chest pain since 2009.  Had been on Xarelto chronically since recurrent PE in 2013.  No PE on CT in 2018.  She was previously seen in our office in 2018 after failing syncopal type episode after leaving her PCPs office.  Blood sugars have been labile.  She feels dizzy weak at times.  Thankfully, she has had extensive work-up in the past from cardiology.  Negative.  She has not really had any of the syncopal episodes since she has been on the seizure type medication.  Excellent.  She did state that while at a previous doctors visit they did hear some irregular heartbeats some skips.  1 time on her loop recorder nighttime brief benign arrhythmia.  No atrial fibrillation.  Her primary physician also asked if we would be comfortable managing her Xarelto.  This is fine.  I agree with lifelong Xarelto given her recurrent PE.  Past Medical History:  Diagnosis Date   Anemia    Arthritis    Left leg   Asthma    BV (bacterial vaginosis) 1287   Complication of anesthesia    Seizures in PACU after  surgery 3/15   DM (diabetes mellitus) ([place])    diagnosed 2009   DVT (deep venous thrombosis) ([place]) 2011   pt had IUD; also 05/2012   Dysfunctional uterine bleeding    Seen by Dr. Leo Grosser, GYN; pending hysterectomy as of 07/2012   H/O hypercoagulable state    2/2 contraception   Headache(784.0)    migraines   Herpes simplex without mention of complication 07/6766   HSV-2   HLD (hyperlipidemia)    Hypertension    Labial lesion 2013   Major depression    Hx suicide attempt in 2009, [facility] admissions   Mastodynia    Neuromuscular disorder ([place])    neuropathy   Neuropathy    Obesity    Oligomenorrhea 2011   PE (pulmonary embolism)    2009 - on oral contraception; multiple   Post - coital bleeding 2012   Seizures ([place]) 2015   last episode May 2015   Sleep apnea    Status post placement of implantable loop recorder    Stroke ([place])    TIA 2013   Syncopal episodes    unknown etiology   Thyroid disease    hypothyroidism (pt denies)   Vaginal Pap smear, abnormal    Venous insufficiency    Vitamin D deficiency    unknown to pt.    Past  Surgical History:  Procedure Laterality Date   BILATERAL SALPINGECTOMY  12/18/2012   Procedure: BILATERAL SALPINGECTOMY;  Surgeon: Eldred Manges, MD;  Location: [facility];  Service: Gynecology;  Laterality: Bilateral;   BLADDER SUSPENSION N/A 02/13/2014   Procedure: TRANSVAGINAL TAPE (TVT) PROCEDURE;  Surgeon: Delice Lesch, MD;  Location: [facility];  Service: Gynecology;  Laterality: N/A;   BREAST REDUCTION SURGERY     Dr. Eugene [place] DFT     Implantable Loop recorder; no arrhythmias associated with synocpal spells; loop explanted 07-2013   CHOLECYSTECTOMY     DILATATION & CURRETTAGE/HYSTEROSCOPY WITH RESECTOCOPE  2007 & 2013   ENDOMETRIAL BIOPSY  2011   IUD REMOVAL  12/18/2012   Procedure: INTRAUTERINE DEVICE (IUD) REMOVAL;  Surgeon: Eldred Manges, MD;  Location: [facility];  Service: Gynecology;   Laterality: N/A;  Removed during prep by E. Powell PA   LAPAROSCOPIC ASSISTED VAGINAL HYSTERECTOMY  12/18/2012   Procedure: LAPAROSCOPIC ASSISTED VAGINAL HYSTERECTOMY;  Surgeon: Eldred Manges, MD;  Location: [facility];  Service: Gynecology;  Laterality: N/A;   LOOP RECORDER EXPLANT N/A 07/17/2013   Procedure: LOOP RECORDER EXPLANT;  Surgeon: Thompson Grayer, MD;  Location: [facility] C[facility];  Service: Cardiovascular;  Laterality: N/A;   TRANSVAGINAL TAPE (TVT) REMOVAL N/A 04/28/2014   Procedure: Removal of suburethral mesh;  Surgeon: Delice Lesch, MD;  Location: [facility];  Service: Gynecology;  Laterality: N/A;   WISDOM TOOTH EXTRACTION      Current Medications: Current Meds  Medication Sig   acetaminophen (TYLENOL) 500 MG tablet Take 1,000 mg by mouth every 6 (six) hours as needed for moderate pain.    albuterol (PROVENTIL HFA;VENTOLIN HFA) 108 (90 BASE) MCG/ACT inhaler Inhale 2 puffs into the lungs every 6 (six) hours as needed for wheezing or shortness of breath.   ALPRAZolam (XANAX) 0.5 MG tablet alprazolam 0.5 mg tablet  TAKE ONE TABLET BY MOUTH AS NEEDED FOR flight   amitriptyline (ELAVIL) 25 MG tablet 1 tablet at bedtime.   amLODipine (NORVASC) 5 MG tablet Take 5 mg by mouth daily.   atorvastatin (LIPITOR) 10 MG tablet Take 10 mg by mouth at bedtime.   benzonatate (TESSALON) 100 MG capsule 1 capsule as needed   cetirizine (ZYRTEC) 10 MG tablet Take 1 tablet (10 mg total) by mouth daily.   chlorthalidone (HYGROTON) 25 MG tablet TAKE 2 TABLETS(50 MG) BY MOUTH DAILY   Continuous Blood Gluc Sensor (FREESTYLE LIBRE 2 SENSOR) MISC 1 Device by Does not apply route as directed. Change every 14 days   COVID-19 mRNA vaccine, Moderna, 100 MCG/0.5ML injection Inject into the muscle.   cyclobenzaprine (FLEXERIL) 10 MG tablet cyclobenzaprine 10 mg tablet   EPINEPHrine 0.3 mg/0.3 mL IJ SOAJ injection Inject 0.3 mg into the muscle as needed for anaphylaxis.    EPINEPHrine 0.3 mg/0.3 mL IJ SOAJ injection See  admin instructions.   Fluocinolone Acetonide Body 0.01 % OIL fluocinolone 0.01 % scalp oil and shower cap  APPLY SMALL AMOUNT TOPICALLY TO THE AFFECTED AREA 2 TO 3 TIMES WEEKLY AS DIRECTED   Fluocinolone Acetonide Scalp 0.01 % OIL    fluticasone (FLONASE) 50 MCG/ACT nasal spray Place 1 spray into both nostrils daily as needed (for nasal congestion/sinus infection).    glucose blood test strip 1 each by Other route as needed for other. Use as instructed to test blood sugar  3 times daily E11.65   insulin aspart (NOVOLOG FLEXPEN) 100 UNIT/ML FlexPen 8 units  insulin degludec (TRESIBA FLEXTOUCH) 100 UNIT/ML FlexTouch Pen Inject 24 Units into the skin daily.   Insulin Pen Needle (NOVOFINE PLUS PEN NEEDLE) 32G X 4 MM MISC Use as directed  daily E11.65   ipratropium (ATROVENT) 0.06 % nasal spray Place 2 sprays into both nostrils 4 (four) times daily.   levocetirizine (XYZAL) 5 MG tablet 1 tablet in the evening   lisinopril (ZESTRIL) 5 MG tablet Take 2.5 mg by mouth daily.   montelukast (SINGULAIR) 10 MG tablet Take 10 mg by mouth at bedtime. Reported on 02/16/2016   ondansetron (ZOFRAN ODT) 4 MG disintegrating tablet Take 1 tablet (4 mg total) by mouth every 8 (eight) hours as needed for nausea or vomiting.   pioglitazone (ACTOS) 30 MG tablet Take 1 tablet (30 mg total) by mouth daily.   predniSONE (DELTASONE) 10 MG tablet 2 tablets   pregabalin (LYRICA) 100 MG capsule Take 1 capsule (100 mg total) by mouth 3 (three) times daily.   rivaroxaban (XARELTO) 20 MG TABS tablet Take 20 mg by mouth at bedtime.   SYMBICORT 160-4.5 MCG/ACT inhaler Inhale 2 puffs into the lungs 2 (two) times daily.   terconazole (TERAZOL 7) 0.4 % vaginal cream SMARTSIG:1 Applicator Vaginal Daily   topiramate (TOPAMAX) 25 MG tablet Take 1 tablet (25 mg total) by mouth at bedtime.   triamcinolone (NASACORT) 55 MCG/ACT AERO nasal inhaler 1 spray in each nostril   TRULICITY 3 UM/3.5TI SOPN INJECT 3mg  SUBCUTANEOUSLY ONCE A WEEK      Allergies:   Silver, Canagliflozin, Codeine, Darvocet [propoxyphene n-acetaminophen], Glipizide, Hydrocodone-acetaminophen, Lantus [insulin glargine], Liraglutide, Metformin hcl, Propoxyphene, Hydrocodone-acetaminophen, Latex, Tape, and Tramadol   Social History   Socioeconomic History   Marital status: Married    Spouse name: Not on file   Number of children: Not on file   Years of education: Not on file   Highest education level: Not on file  Occupational History   Occupation: International aid/development worker: UNEMPLOYED    Comment: Disabled 1443 due to PE complications  Tobacco Use   Smoking status: Never   Smokeless tobacco: Never  Vaping Use   Vaping Use: Never used  Substance and Sexual Activity   Alcohol use: Yes    Comment: once in a while    Drug use: No   Sexual activity: Yes    Partners: Male    Birth control/protection: Surgical  Other Topics Concern   Not on file  Social History Narrative   Lives in [place]   Lives with significant other, Gwyndolyn Saxon   Completed 10th grade.   Worker Compensation Case 2009-2012 related to PE         Epworth Sleepiness Scale      Total score:  13      --I have high BP   --I have had a stroke   --I have had insomnia   --I have been told that I stop breathing during sleep   --I wake up to urinate frequently at night   --I wake up with a dry mouth and a sore throat   --I have Diabetes   --I have been told that I snore   --I am overweight or am gaining weight   --I have problems with memory/concentration   --I awake feeling not rested   --I frequently awake with headaches   --I have dozed off or fallen asleep at inappropriate times during the day   Social Determinants of Health   Financial Resource Strain: Not on file  Food Insecurity: Not on file  Transportation Needs: Not on file  Physical Activity: Not on file  Stress: Not on file  Social Connections: Not on file     Family History: The patient's family history includes  Breast cancer in her paternal grandmother; Cancer in her paternal grandmother; Diabetes in her father, maternal grandmother, and mother; Diabetes type II in her maternal grandmother; Hypertension in her father and mother; Kidney disease in her father; Melanoma (age of onset: 38) in her father; Pulmonary embolism in her paternal grandfather; Stroke in her maternal grandfather; Ulcerative colitis (age of onset: 50) in her mother.  ROS:   Please see the history of present illness.    No fevers chills nausea vomiting all other systems reviewed and are negative.  EKGs/Labs/Other Studies Reviewed:    The following studies were reviewed today:  ECHO 11/09/2019:   1. Left ventricular ejection fraction, by visual estimation, is 60 to  65%. The left ventricle has normal function. There is severely increased  left ventricular hypertrophy.   2. Left ventricular diastolic parameters are consistent with Grade I  diastolic dysfunction (impaired relaxation).   3. Global right ventricle has normal systolic function.The right  ventricular size is normal.   4. Left atrial size was normal.   5. Right atrial size was normal.   6. The mitral valve is normal in structure. No evidence of mitral valve  regurgitation. No evidence of mitral stenosis.   7. The tricuspid valve is normal in structure. Tricuspid valve  regurgitation is trivial.   8. The aortic valve is tricuspid. Aortic valve regurgitation is not  visualized. Mild aortic valve sclerosis without stenosis.   9. The pulmonic valve was normal in structure. Pulmonic valve  regurgitation is trivial.  10. The inferior vena cava is normal in size with greater than 50%  respiratory variability, suggesting right atrial pressure of 3 mmHg.  11. Hyperdynamic LV systolic function; intracavitary gradient of 1.4 m/s  likely related to vigorous LV function; severe LVH; grade 1 diastolic  dysfunction.   EKG:  EKG is  ordered today.  The ekg ordered today  demonstrates 89 NSR normal intervals.  Recent Labs: 06/09/2021: BUN 14; Creatinine, Ser 1.09; Potassium 3.0; Sodium 138  Recent Lipid Panel    Component Value Date/Time   CHOL 145 06/09/2021 0813   TRIG 189.0 (H) 06/09/2021 0813   HDL 42.40 06/09/2021 0813   CHOLHDL 3 06/09/2021 0813   VLDL 37.8 06/09/2021 0813   LDLCALC 65 06/09/2021 0813   Physical Exam:    VS:  BP 100/60 (BP Location: Left Arm, Patient Position: Sitting, Cuff Size: Normal)   Pulse 89   Ht 5' (1.524 m)   Wt 158 lb (71.7 kg)   LMP 11/06/2012   SpO2 95%   BMI 30.86 kg/m     Wt Readings from Last 3 Encounters:  10/06/21 158 lb (71.7 kg)  09/09/21 156 lb 12.8 oz (71.1 kg)  06/29/21 151 lb (68.5 kg)     GEN:  Well nourished, well developed in no acute distress HEENT: Normal NECK: No JVD; No carotid bruits LYMPHATICS: No lymphadenopathy CARDIAC: RRR, no murmurs, rubs, gallops RESPIRATORY:  Clear to auscultation without rales, wheezing or rhonchi  ABDOMEN: Soft, non-tender, non-distended MUSCULOSKELETAL:  No edema; No deformity  SKIN: Warm and dry NEUROLOGIC:  Alert and oriented x 3 PSYCHIATRIC:  Normal affect   ASSESSMENT:    1. Chronic pulmonary embolism with acute cor pulmonale, unspecified pulmonary embolism type ([place])   2.  Essential hypertension   3. Syncope and collapse   4. Palpitations    PLAN:    In order of problems listed above:  Chronic pulmonary embolism (HCC) We will go ahead and help her with her Xarelto yearly.  20 mg.  Could consider decreasing to lower dose at some point however if she is not having any bleeding issues, it is fine for her to continue with current dosing.  Last hemoglobin 13.6.  Syncope and collapse Thankfully, negative loop recorder.  This has been extracted.  Excellent.  Seizures were treated and she is doing better.  Palpitations Rare skipped beats were appreciated previously.  Benign.  Reassurance.  EKG today excellent.         Medication  Adjustments/Labs and Tests Ordered: Current medicines are reviewed at length with the patient today.  Concerns regarding medicines are outlined above.  Orders Placed This Encounter  Procedures   EKG 12-Lead    No orders of the defined types were placed in this encounter.   Patient Instructions  Medication Instructions:  The current medical regimen is effective;  continue present plan and medications.  *If you need a refill on your cardiac medications before your next appointment, please call your pharmacy*  Follow-Up: At [facility], you and your health needs are our priority.  As part of our continuing mission to provide you with exceptional heart care, we have created designated Provider Care Teams.  These Care Teams include your primary Cardiologist (physician) and Advanced Practice Providers (APPs -  Physician Assistants and Nurse Practitioners) who all work together to provide you with the care you need, when you need it.  We recommend signing up for the patient portal called "MyChart".  Sign up information is provided on this After Visit Summary.  MyChart is used to connect with patients for Virtual Visits (Telemedicine).  Patients are able to view lab/test results, encounter notes, upcoming appointments, etc.  Non-urgent messages can be sent to your provider as well.   To learn more about what you can do with MyChart, go to [email or web address].    Your next appointment:   1 year(s)  The format for your next appointment:   In Person  Provider:   Candee Furbish, MD   Thank you for choosing [facility]!!     Signed, Candee Furbish, MD  10/06/2021 4:11 PM    [place]

## 2021-10-06 NOTE — Assessment & Plan Note (Signed)
Thankfully, negative loop recorder.  This has been extracted.  Excellent.  Seizures were treated and she is doing better.

## 2021-10-14 DIAGNOSIS — M542 Cervicalgia: Secondary | ICD-10-CM | POA: Insufficient documentation

## 2021-10-14 DIAGNOSIS — M19012 Primary osteoarthritis, left shoulder: Secondary | ICD-10-CM | POA: Diagnosis not present

## 2021-10-20 DIAGNOSIS — E118 Type 2 diabetes mellitus with unspecified complications: Secondary | ICD-10-CM | POA: Diagnosis not present

## 2021-10-20 DIAGNOSIS — E1165 Type 2 diabetes mellitus with hyperglycemia: Secondary | ICD-10-CM | POA: Diagnosis not present

## 2021-10-20 DIAGNOSIS — I10 Essential (primary) hypertension: Secondary | ICD-10-CM | POA: Diagnosis not present

## 2021-10-20 DIAGNOSIS — Z794 Long term (current) use of insulin: Secondary | ICD-10-CM | POA: Diagnosis not present

## 2021-10-20 DIAGNOSIS — E1142 Type 2 diabetes mellitus with diabetic polyneuropathy: Secondary | ICD-10-CM | POA: Diagnosis not present

## 2021-10-20 DIAGNOSIS — J4531 Mild persistent asthma with (acute) exacerbation: Secondary | ICD-10-CM | POA: Diagnosis not present

## 2021-10-20 DIAGNOSIS — G43009 Migraine without aura, not intractable, without status migrainosus: Secondary | ICD-10-CM | POA: Diagnosis not present

## 2021-10-20 DIAGNOSIS — M19012 Primary osteoarthritis, left shoulder: Secondary | ICD-10-CM | POA: Diagnosis not present

## 2021-11-05 DIAGNOSIS — M5412 Radiculopathy, cervical region: Secondary | ICD-10-CM | POA: Diagnosis not present

## 2021-11-06 NOTE — Progress Notes (Signed)
New Patient Note  RE: Makayla Frazier MRN: [id number] DOB: Feb 05, 1971 Date of Office Visit: 11/07/2021  Consult requested by: Carol Ada, MD Primary care provider: Maude Leriche, PA-C  Chief Complaint: Allergic Rhinitis  (Was on antibiotics all summer due to sinus issues - sneezing, coughing, drainage, clear/ thick white mucus.  Tried zyrtec, Claritin, singular, and nasal spray. Did not help.  Was given an epi pen for seasonal allergies. ) and Asthma (Has some difficulty breathing more in the summertime or if its extremely cold. )  History of Present Illness: I had the pleasure of seeing Makayla Frazier for initial evaluation at the [facility] on 11/07/2021. She is a 50 y.o. female, who is referred here by Scifres, Earlie Server, PA-C for the evaluation of environmental allergies.  Rhinitis:  She reports symptoms of sneezing, PND, rhinorrhea, nasal congestion, itchy/watery eyes, coughing. Symptoms have been going on for 5-6 years. The symptoms are present mainly in the summer. Other triggers include exposure to unknown. Anosmia: no. Headache: sometimes. She has used zyrtec, Claritin, Singulair, Nasacort, Atrovent with minimal improvement in symptoms. Sinus infections: 3-4 antibiotics. Previous work up includes: in the past and was positive to grass per patient. No prior AIT. Previous ENT evaluation: no. Previous sinus imaging: no. History of nasal polyps: no. Last eye exam: February 2022. History of reflux: yes but doesn't take meds for this.  Asthma: She reports symptoms of shortness of breath, coughing, wheezing for 10+ years. Current medications include Symbicort 2 puffs twice a day with unknown benefit. She reports not using aerochamber with inhalers. She tried the following inhalers: albuterol prn. Main triggers are cold air, walking. In the last month, frequency of symptoms: depends. Frequency of nocturnal symptoms: 0x/month. Frequency of SABA use: <1x/week.  Interference with physical activity: yes. Sleep is undisturbed. In the last 12 months, emergency room visits/urgent care visits/doctor office visits or hospitalizations due to respiratory issues: 0. In the last 12 months, oral steroids courses: no. Lifetime history of hospitalization for respiratory issues: yes. Prior intubations: no. History of pneumonia: no. She was evaluated by allergist/pulmonologist in the past. Smoking exposure: no. Up to date with flu vaccine: yes. Up to date with COVID-19 vaccine: yes. Prior Covid-19 infection: twice - 2020. Patient has history of blood clots and is on Xarelto.  Assessment and Plan: Amand is a 50 y.o. female with: Other allergic rhinitis Rhino conjunctivitis symptoms mainly in the summer for the past 5-6 years. Tried zyrtec, Claritin, Singulair, Nasacort, Atrovent with minimal benefit. 3-4 courses of antibiotics. Skin testing int he past was positive to grass per patient report. No prior AIT. No prior ENT evaluation.  Today's skin testing showed: positive to grass, ragweed, weed, trees.  Start environmental control measures as below. Use over the counter antihistamines such as Allegra (fexofenadine), or Xyzal (levocetirizine) daily as needed. May take twice a day during allergy flares. May switch antihistamines every few months. Continue Singulair (montelukast) 10mg  daily at night. Use azelastine nasal spray 1-2 sprays per nostril twice a day as needed for runny nose/drainage. Use Nasacort (triamcinolone) nasal spray 1 spray per nostril twice a day as needed for nasal congestion.  Nasal saline spray (i.e., Simply Saline) or nasal saline lavage (i.e., NeilMed) is recommended as needed and prior to medicated nasal sprays. Use olopatadine eye drops 0.2% once a day as needed for itchy/watery eyes. Consider allergy injections for long term control if above medications do not help the symptoms - handout given.   Allergic conjunctivitis  of both eyes See  assessment and plan as above.  Frequent episodes of sinusitis 3-4 courses of antibiotics this year.  Patient does have environmental allergies which can predispose her to getting sinus infections. Keep track of infections and antibiotics use. If no improvement, will get bloodwork to look at immune system and refer to ENT to look at sinus anatomy.  Asthma Diagnosed with asthma 10+ years ago. History of blood clots and is on Xarelto. Not sure if Symbicort is helping - she is not sure if she has the 41mcg or 130mcg dose at home. Today's spirometry showed: possible restrictive disease with 6% improvement in FEV1 post bronchodilator treatment. Clinically feeling improved.  Daily controller medication(s): Symbicort 122mcg 2 puffs twice a day with spacer and rinse mouth afterwards. Continue Singulair (montelukast) 10mg  daily at night. Spacer given and demonstrated proper use with inhaler. Patient understood technique and all questions/concerned were addressed.  May use albuterol rescue inhaler 2 puffs every 4 to 6 hours as needed for shortness of breath, chest tightness, coughing, and wheezing. May use albuterol rescue inhaler 2 puffs 5 to 15 minutes prior to strenuous physical activities. Monitor frequency of use.  Get spirometry at next visit.  Return in about 3 months (around 02/07/2022).  Meds ordered this encounter  Medications   budesonide-formoterol (SYMBICORT) 160-4.5 MCG/ACT inhaler    Sig: Inhale 2 puffs into the lungs in the morning and at bedtime. with spacer and rinse mouth afterwards.    Dispense:  1 each    Refill:  3   levocetirizine (XYZAL) 5 MG tablet    Sig: Take 1 tablet (5 mg total) by mouth every evening.    Dispense:  30 tablet    Refill:  5   azelastine (ASTELIN) 0.1 % nasal spray    Sig: Place 1-2 sprays into both nostrils 2 (two) times daily as needed (nasal drainage). Use in each nostril as directed    Dispense:  30 mL    Refill:  5    Lab Orders  No laboratory  test(s) ordered today    Other allergy screening: Food allergy: no Medication allergy: yes Hymenoptera allergy: large localized reactions Urticaria: no Eczema:no History of recurrent infections suggestive of immunodeficency: no  Diagnostics: Spirometry:  Tracings reviewed. Her effort: Good reproducible efforts. FVC: 1.64L FEV1: 1.37L, 70% predicted FEV1/FVC ratio: 84% Interpretation: Spirometry consistent with possible restrictive disease with 6% improvement in FEV1 post bronchodilator treatment. Clinically feeling improved.   Please see scanned spirometry results for details.  Skin Testing: Environmental allergy panel and select foods. Positive to grass, ragweed, weed, trees.  Results discussed with patient/family.  Airborne Adult Perc - 11/07/21 0923     Time Antigen Placed 4709    Allergen Manufacturer Lavella Hammock    Location Back    Number of Test 59    2. Control-Histamine 1 mg/ml 2+    4. Sprague Negative    5. [place] Negative    6. [place] --   +/-   7. [place] Blue Negative    8. Meadow Fescue Negative    9. Perennial Rye 4+    10. Sweet Vernal Negative    11. Timothy 3+    12. Cocklebur Negative    13. Burweed Marshelder Negative    14. Ragweed, short Negative    15. Ragweed, Giant Negative    16. Plantain,  English Negative    17. Lamb's Quarters Negative    18. Sheep Sorrell Negative    19. Rough Pigweed Negative  [place], Rough Negative    21. Mugwort, Common Negative    22. Ash mix Negative    23. Birch mix Negative    24. Beech American Negative    25. Box, Elder Negative    26. Cedar, red Negative    27. Cottonwood, [place] Negative    28. Elm mix Negative    29. Hickory Negative    30. Maple mix Negative    31. Oak, [place] mix Negative    32. Pecan Pollen Negative    33. Pine mix Negative    34. Sycamore Eastern Negative    35. Sibley, Black Pollen Negative    36. Alternaria alternata Negative    37. Cladosporium Herbarum Negative     38. Aspergillus mix Negative    39. Penicillium mix Negative    40. Bipolaris sorokiniana (Helminthosporium) Negative    41. Drechslera spicifera (Curvularia) Negative    42. Mucor plumbeus Negative    43. Fusarium moniliforme Negative    44. Aureobasidium pullulans (pullulara) Negative    45. Rhizopus oryzae Negative    46. Botrytis cinera Negative    47. Epicoccum nigrum Negative    48. Phoma betae Negative    49. Candida Albicans Negative    50. Trichophyton mentagrophytes Negative    51. Mite, D Farinae  5,000 AU/ml Negative    52. Mite, D Pteronyssinus  5,000 AU/ml Negative    53. Cat Hair 10,000 BAU/ml Negative    54.  Dog Epithelia Negative    55. Mixed Feathers Negative    56. Horse Epithelia Negative    57. Cockroach, German Negative    58. Mouse Negative             Food Perc - 11/07/21 0924       Test Information   Time Antigen Placed 0737    Allergen Manufacturer Lavella Hammock    Location Back    Number of allergen test 10      Food   1. Peanut Negative    2. Soybean food Negative    3. Wheat, whole Negative    4. Sesame Negative    5. Milk, cow Negative    6. Egg White, chicken Negative    7. Casein Negative    8. Shellfish mix Negative    9. Fish mix Negative    10. Cashew Negative             Intradermal - 11/07/21 1003     Time Antigen Placed 1003    Allergen Manufacturer Lavella Hammock    Location Back    Control Negative    [place] 4+    Ragweed mix 2+    Weed mix 2+    Tree mix 3+    Mold 1 Negative    Mold 2 Negative    Mold 3 Negative    Mold 4 Negative    Cat Negative    Dog Negative    Cockroach Negative    Mite mix Negative             Past Medical History: Patient Active Problem List   Diagnosis Date Noted   Allergic conjunctivitis of both eyes 11/07/2021   Frequent episodes of sinusitis 11/07/2021   Palpitations 10/06/2021   Diabetic neuropathy ([place]) 08/18/2021   History of epilepsy 08/18/2021   Lumbar radiculopathy  07/04/2021   Pelvic mass 07/04/2021   Type 2 diabetes mellitus with hyperglycemia, with long-term current use of insulin ([place]) 06/09/2021  Dyslipidemia 06/09/2021   Acanthosis nigricans 05/12/2021   Allergic rhinitis due to pollen 05/12/2021   Fear of flying 05/12/2021   Hemorrhoids 05/12/2021   History of 2019 novel coronavirus disease (COVID-19) 05/12/2021   Iron deficiency anemia 05/12/2021   Leukocytosis 05/12/2021   Migraine without aura, not refractory 05/12/2021   Other allergy status, other than to drugs and biological substances 05/12/2021   Polyneuropathy due to type 2 diabetes mellitus ([place]) 05/12/2021   Sciatica 05/12/2021   Low back pain 04/26/2021   Mass of psoas muscle 04/26/2021   DM (diabetes mellitus) ([place])    Obesity    Stroke ([place])    Hypertension    Herpes simplex type 2 infection 10/14/2020   Type 2 diabetes mellitus with hyperglycemia, without long-term current use of insulin ([place]) 12/25/2019   Type 2 diabetes mellitus with diabetic neuropathy, with long-term current use of insulin ([place]) 12/25/2019   Transient loss of consciousness 11/09/2019   Weakness of right leg 11/09/2019   Multiple idiopathic pulmonary cysts 11/09/2019   Hypertensive urgency 11/09/2019   Hyperlipidemia associated with type 2 diabetes mellitus ([place]) 06/27/2018   GAD (generalized anxiety disorder) 08/21/2016   Moderate single current episode of major depressive disorder ([place]) 08/21/2016   Reaction to severe stress 03/07/2016   Stress 03/07/2016   Bleeding per rectum 11/29/2015   Acute pansinusitis 10/20/2015   Candida vaginitis 10/20/2015   Cerebrovascular accident, late effects 08/30/2015   D (diarrhea) 08/02/2015   Loose bowel movement 08/02/2015   Seizure disorder ([place]) 07/13/2015   Adnexal pain 06/08/2015   Right flank pain 05/30/2015   Chronic pulmonary embolism ([place]) 05/14/2015   Long term current use of anticoagulant 05/14/2015   Seizures ([place]) 05/07/2014   Absence of  bladder continence 03/27/2014   Arthropathia 02/25/2014   Chronic chest pain 02/25/2014   Healed or old pulmonary embolism 02/25/2014   HLD (hyperlipidemia) 02/25/2014   Headache, migraine 02/25/2014   Obstructive apnea 02/25/2014   Uncontrolled diabetes mellitus with hyperglycemia ([place]) 02/25/2014   Incontinence 02/13/2014   S/P vaginal hysterectomy 01/29/2013   TIA (transient ischemic attack) 08/03/2012   Left-sided weakness 08/03/2012   Hypokalemia 08/03/2012   Chest pain 08/03/2012   Temporary cerebral vascular dysfunction 08/03/2012   History of pulmonary embolism 05/18/2012   Symptomatic mammary hypertrophy 10/12/2011   Patellar tendinitis of left knee 08/21/2011   Knee pain, left 07/07/2011   PRURITUS 07/04/2010   Syncope and collapse 10/18/2009   Other allergic rhinitis 08/11/2009   Unspecified vitamin D deficiency 07/21/2009   Adiposity 07/19/2009   Apnea, sleep 07/19/2009   ANEMIA 07/10/2008   Essential hypertension 07/08/2008   Insulin dependent diabetes mellitus with complications ([place]) 26/94/8546   Peripheral neuropathy 01/22/2008   Asthma 01/22/2008   Mononeuritis 01/22/2008   Past Medical History:  Diagnosis Date   Anemia    Arthritis    Left leg   Asthma    BV (bacterial vaginosis) 2703   Complication of anesthesia    Seizures in PACU after surgery 3/15   DM (diabetes mellitus) ([place])    diagnosed 2009   DVT (deep venous thrombosis) ([place]) 2011   pt had IUD; also 05/2012   Dysfunctional uterine bleeding    Seen by Dr. Leo Grosser, GYN; pending hysterectomy as of 07/2012   H/O hypercoagulable state    2/2 contraception   Headache(784.0)    migraines   Herpes simplex without mention of complication 04/92   HSV-2   HLD (hyperlipidemia)    Hypertension  Labial lesion 2013   Major depression    Hx suicide attempt in 2009, [facility] admissions   Mastodynia    Neuromuscular disorder ([place])    neuropathy   Neuropathy    Obesity    Oligomenorrhea 2011   PE  (pulmonary embolism)    2009 - on oral contraception; multiple   Post - coital bleeding 2012   Seizures ([place]) 2015   last episode May 2015   Sleep apnea    Status post placement of implantable loop recorder    Stroke ([place])    TIA 2013   Syncopal episodes    unknown etiology   Thyroid disease    hypothyroidism (pt denies)   Vaginal Pap smear, abnormal    Venous insufficiency    Vitamin D deficiency    unknown to pt.   Past Surgical History: Past Surgical History:  Procedure Laterality Date   BILATERAL SALPINGECTOMY  12/18/2012   Procedure: BILATERAL SALPINGECTOMY;  Surgeon: Eldred Manges, MD;  Location: [facility];  Service: Gynecology;  Laterality: Bilateral;   BLADDER SUSPENSION N/A 02/13/2014   Procedure: TRANSVAGINAL TAPE (TVT) PROCEDURE;  Surgeon: Delice Lesch, MD;  Location: [facility];  Service: Gynecology;  Laterality: N/A;   BREAST REDUCTION SURGERY     Dr. Eugene Garnet Karnes DFT     Implantable Loop recorder; no arrhythmias associated with synocpal spells; loop explanted 07-2013   CHOLECYSTECTOMY     DILATATION & CURRETTAGE/HYSTEROSCOPY WITH RESECTOCOPE  2007 & 2013   ENDOMETRIAL BIOPSY  2011   IUD REMOVAL  12/18/2012   Procedure: INTRAUTERINE DEVICE (IUD) REMOVAL;  Surgeon: Eldred Manges, MD;  Location: [facility];  Service: Gynecology;  Laterality: N/A;  Removed during prep by E. Powell PA   LAPAROSCOPIC ASSISTED VAGINAL HYSTERECTOMY  12/18/2012   Procedure: LAPAROSCOPIC ASSISTED VAGINAL HYSTERECTOMY;  Surgeon: Eldred Manges, MD;  Location: [facility];  Service: Gynecology;  Laterality: N/A;   LOOP RECORDER EXPLANT N/A 07/17/2013   Procedure: LOOP RECORDER EXPLANT;  Surgeon: Thompson Grayer, MD;  Location: [facility];  Service: Cardiovascular;  Laterality: N/A;   TRANSVAGINAL TAPE (TVT) REMOVAL N/A 04/28/2014   Procedure: Removal of suburethral mesh;  Surgeon: Delice Lesch, MD;  Location: [facility];  Service: Gynecology;  Laterality: N/A;    WISDOM TOOTH EXTRACTION     Medication List:  Current Outpatient Medications  Medication Sig Dispense Refill   acetaminophen (TYLENOL) 500 MG tablet Take 1,000 mg by mouth every 6 (six) hours as needed for moderate pain.      albuterol (PROVENTIL HFA;VENTOLIN HFA) 108 (90 BASE) MCG/ACT inhaler Inhale 2 puffs into the lungs every 6 (six) hours as needed for wheezing or shortness of breath.     ALPRAZolam (XANAX) 0.5 MG tablet alprazolam 0.5 mg tablet  TAKE ONE TABLET BY MOUTH AS NEEDED FOR flight     amitriptyline (ELAVIL) 25 MG tablet 1 tablet at bedtime.     amLODipine (NORVASC) 5 MG tablet Take 5 mg by mouth daily.     atorvastatin (LIPITOR) 10 MG tablet Take 10 mg by mouth at bedtime.     azelastine (ASTELIN) 0.1 % nasal spray Place 1-2 sprays into both nostrils 2 (two) times daily as needed (nasal drainage). Use in each nostril as directed 30 mL 5   benzonatate (TESSALON) 100 MG capsule 1 capsule as needed     budesonide-formoterol (SYMBICORT) 160-4.5 MCG/ACT inhaler Inhale 2 puffs into the lungs in the morning and at  bedtime. with spacer and rinse mouth afterwards. 1 each 3   chlorthalidone (HYGROTON) 25 MG tablet TAKE 2 TABLETS(50 MG) BY MOUTH DAILY 180 tablet 0   Continuous Blood Gluc Sensor (FREESTYLE LIBRE 2 SENSOR) MISC 1 Device by Does not apply route as directed. Change every 14 days 6 each 3   COVID-19 mRNA vaccine, Moderna, 100 MCG/0.5ML injection Inject into the muscle. 0.25 mL 0   cyclobenzaprine (FLEXERIL) 10 MG tablet cyclobenzaprine 10 mg tablet     EPINEPHrine 0.3 mg/0.3 mL IJ SOAJ injection Inject 0.3 mg into the muscle as needed for anaphylaxis.      EPINEPHrine 0.3 mg/0.3 mL IJ SOAJ injection See admin instructions.     Fluocinolone Acetonide Body 0.01 % OIL fluocinolone 0.01 % scalp oil and shower cap  APPLY SMALL AMOUNT TOPICALLY TO THE AFFECTED AREA 2 TO 3 TIMES WEEKLY AS DIRECTED     Fluocinolone Acetonide Scalp 0.01 % OIL      fluticasone (FLONASE) 50 MCG/ACT nasal  spray Place 1 spray into both nostrils daily as needed (for nasal congestion/sinus infection).      glucose blood test strip 1 each by Other route as needed for other. Use as instructed to test blood sugar  3 times daily E11.65 100 each 11   insulin aspart (NOVOLOG FLEXPEN) 100 UNIT/ML FlexPen 8 units     insulin degludec (TRESIBA FLEXTOUCH) 100 UNIT/ML FlexTouch Pen Inject 24 Units into the skin daily. 30 mL 3   Insulin Pen Needle (NOVOFINE PLUS PEN NEEDLE) 32G X 4 MM MISC Use as directed  daily E11.65 100 each 2   ipratropium (ATROVENT) 0.06 % nasal spray Place 2 sprays into both nostrils 4 (four) times daily.     levocetirizine (XYZAL) 5 MG tablet Take 1 tablet (5 mg total) by mouth every evening. 30 tablet 5   lisinopril (ZESTRIL) 5 MG tablet Take 2.5 mg by mouth daily.     montelukast (SINGULAIR) 10 MG tablet Take 10 mg by mouth at bedtime. Reported on 02/16/2016     ondansetron (ZOFRAN ODT) 4 MG disintegrating tablet Take 1 tablet (4 mg total) by mouth every 8 (eight) hours as needed for nausea or vomiting. 20 tablet 0   pioglitazone (ACTOS) 30 MG tablet Take 1 tablet (30 mg total) by mouth daily. 90 tablet 3   pregabalin (LYRICA) 100 MG capsule Take 1 capsule (100 mg total) by mouth 3 (three) times daily. 90 capsule 5   rivaroxaban (XARELTO) 20 MG TABS tablet Take 20 mg by mouth at bedtime.     terconazole (TERAZOL 7) 0.4 % vaginal cream SMARTSIG:1 Applicator Vaginal Daily     topiramate (TOPAMAX) 25 MG tablet Take 1 tablet (25 mg total) by mouth at bedtime. 90 tablet 3   triamcinolone (NASACORT) 55 MCG/ACT AERO nasal inhaler 1 spray in each nostril     TRULICITY 3 ZM/6.2HU SOPN INJECT 3mg  SUBCUTANEOUSLY ONCE A WEEK 2 mL 2   No current facility-administered medications for this visit.   Allergies: Allergies  Allergen Reactions   Silver Other (See Comments) and Rash   Canagliflozin Other (See Comments)   Codeine Nausea And Vomiting    Pt takes Percocet without problems    Darvocet  [Propoxyphene N-Acetaminophen] Nausea And Vomiting   Glipizide     Diarrhea and abdominal pain    Hydrocodone-Acetaminophen Nausea And Vomiting   Lantus [Insulin Glargine]     Burning at injection site    Liraglutide     Other reaction(s): upset stomach  Metformin Hcl     Other reaction(s): diarrhea   Propoxyphene     Other reaction(s): nausea   Hydrocodone-Acetaminophen Nausea And Vomiting    Vomiting   Latex Rash   Tape Itching and Rash   Tramadol Nausea Only   Social History: Social History   Socioeconomic History   Marital status: Married    Spouse name: Not on file   Number of children: Not on file   Years of education: Not on file   Highest education level: Not on file  Occupational History   Occupation: International aid/development worker: UNEMPLOYED    Comment: Disabled 2353 due to PE complications  Tobacco Use   Smoking status: Never   Smokeless tobacco: Never  Vaping Use   Vaping Use: Never used  Substance and Sexual Activity   Alcohol use: Yes    Comment: once in a while    Drug use: No   Sexual activity: Yes    Partners: Male    Birth control/protection: Surgical  Other Topics Concern   Not on file  Social History Narrative   Lives in [place]   Lives with significant other, Gwyndolyn Saxon   Completed 10th grade.   Worker Compensation Case 2009-2012 related to PE         Epworth Sleepiness Scale      Total score:  13      --I have high BP   --I have had a stroke   --I have had insomnia   --I have been told that I stop breathing during sleep   --I wake up to urinate frequently at night   --I wake up with a dry mouth and a sore throat   --I have Diabetes   --I have been told that I snore   --I am overweight or am gaining weight   --I have problems with memory/concentration   --I awake feeling not rested   --I frequently awake with headaches   --I have dozed off or fallen asleep at inappropriate times during the day   Social Determinants of Health   Financial  Resource Strain: Not on file  Food Insecurity: Not on file  Transportation Needs: Not on file  Physical Activity: Not on file  Stress: Not on file  Social Connections: Not on file   Lives in a 50 year old house. Smoking: denies Occupation: not employed  Environmental HistoryFreight forwarder in the house: no Charity fundraiser in the family room: yes Carpet in the bedroom: yes Heating: electric Cooling: central Pet: no  Family History: Family History  Problem Relation Age of Onset   Melanoma Father 7   Diabetes Father    Hypertension Father    Kidney disease Father    Ulcerative colitis Mother 36   Diabetes Mother    Hypertension Mother    Breast cancer Paternal Grandmother    Cancer Paternal Grandmother    Diabetes type II Maternal Grandmother        deceased 29   Diabetes Maternal Grandmother    Pulmonary embolism Paternal Grandfather    Stroke Maternal Grandfather    Problem                               Relation Asthma  Father  Eczema                                no Food allergy                          no Allergic rhino conjunctivitis     no  Review of Systems  Constitutional:  Negative for appetite change, chills, fever and unexpected weight change.  HENT:  Positive for postnasal drip and rhinorrhea. Negative for congestion.   Eyes:  Negative for itching.  Respiratory:  Negative for cough, chest tightness, shortness of breath and wheezing.   Cardiovascular:  Negative for chest pain.  Gastrointestinal:  Negative for abdominal pain.  Genitourinary:  Negative for difficulty urinating.  Skin:  Negative for rash.  Allergic/Immunologic: Positive for environmental allergies. Negative for food allergies.  Neurological:  Positive for headaches.   Objective: BP 118/70   Pulse 79   Temp 97.8 F (36.6 C)   Resp 18   Ht 5' (1.524 m)   Wt 160 lb (72.6 kg)   LMP 11/06/2012   SpO2 100%   BMI 31.25 kg/m  Body mass index is 31.25  kg/m. Physical Exam Vitals and nursing note reviewed.  Constitutional:      Appearance: Normal appearance. She is well-developed.  HENT:     Head: Normocephalic and atraumatic.     Right Ear: Tympanic membrane and external ear normal.     Left Ear: Tympanic membrane and external ear normal.     Nose: Nose normal.     Mouth/Throat:     Mouth: Mucous membranes are moist.     Pharynx: Oropharynx is clear.  Eyes:     Conjunctiva/sclera: Conjunctivae normal.  Cardiovascular:     Rate and Rhythm: Normal rate and regular rhythm.     Heart sounds: Normal heart sounds. No murmur heard.   No friction rub. No gallop.  Pulmonary:     Effort: Pulmonary effort is normal.     Breath sounds: Normal breath sounds. No wheezing, rhonchi or rales.  Musculoskeletal:     Cervical back: Neck supple.  Skin:    General: Skin is warm.     Findings: No rash.  Neurological:     Mental Status: She is alert and oriented to person, place, and time.  Psychiatric:        Behavior: Behavior normal.   The plan was reviewed with the patient/family, and all questions/concerned were addressed.  It was my pleasure to see Phung today and participate in her care. Please feel free to contact me with any questions or concerns.  Sincerely,  Rexene Alberts, DO Allergy & Immunology  [facility] office: [place] office: [phone]

## 2021-11-07 ENCOUNTER — Encounter: Payer: Self-pay | Admitting: Allergy

## 2021-11-07 ENCOUNTER — Other Ambulatory Visit: Payer: Self-pay

## 2021-11-07 ENCOUNTER — Ambulatory Visit (INDEPENDENT_AMBULATORY_CARE_PROVIDER_SITE_OTHER): Payer: Medicare Other | Admitting: Allergy

## 2021-11-07 VITALS — BP 118/70 | HR 79 | Temp 97.8°F | Resp 18 | Ht 60.0 in | Wt 160.0 lb

## 2021-11-07 DIAGNOSIS — J329 Chronic sinusitis, unspecified: Secondary | ICD-10-CM | POA: Diagnosis not present

## 2021-11-07 DIAGNOSIS — J3089 Other allergic rhinitis: Secondary | ICD-10-CM | POA: Diagnosis not present

## 2021-11-07 DIAGNOSIS — J45909 Unspecified asthma, uncomplicated: Secondary | ICD-10-CM | POA: Diagnosis not present

## 2021-11-07 DIAGNOSIS — J4551 Severe persistent asthma with (acute) exacerbation: Secondary | ICD-10-CM | POA: Diagnosis not present

## 2021-11-07 DIAGNOSIS — J45998 Other asthma: Secondary | ICD-10-CM | POA: Diagnosis not present

## 2021-11-07 DIAGNOSIS — H1013 Acute atopic conjunctivitis, bilateral: Secondary | ICD-10-CM

## 2021-11-07 MED ORDER — LEVOCETIRIZINE DIHYDROCHLORIDE 5 MG PO TABS: 5.0000 mg | ORAL_TABLET | Freq: Every evening | ORAL | 5 refills | Status: DC

## 2021-11-07 MED ORDER — BUDESONIDE-FORMOTEROL FUMARATE 160-4.5 MCG/ACT IN AERO: 2.0000 | INHALATION_SPRAY | Freq: Two times a day (BID) | RESPIRATORY_TRACT | 3 refills | Status: AC

## 2021-11-07 MED ORDER — AZELASTINE HCL 0.1 % NA SOLN: 1.0000 | Freq: Two times a day (BID) | NASAL | 5 refills | Status: DC | PRN

## 2021-11-07 NOTE — Assessment & Plan Note (Signed)
Rhino conjunctivitis symptoms mainly in the summer for the past 5-6 years. Tried zyrtec, Claritin, Singulair, Nasacort, Atrovent with minimal benefit. 3-4 courses of antibiotics. Skin testing int he past was positive to grass per patient report. No prior AIT. No prior ENT evaluation.   Today's skin testing showed: positive to grass, ragweed, weed, trees.   Start environmental control measures as below.  Use over the counter antihistamines such as Allegra (fexofenadine), or Xyzal (levocetirizine) daily as needed. May take twice a day during allergy flares. May switch antihistamines every few months.  Continue Singulair (montelukast) 10mg  daily at night.  Use azelastine nasal spray 1-2 sprays per nostril twice a day as needed for runny nose/drainage.  Use Nasacort (triamcinolone) nasal spray 1 spray per nostril twice a day as needed for nasal congestion.   Nasal saline spray (i.e., Simply Saline) or nasal saline lavage (i.e., NeilMed) is recommended as needed and prior to medicated nasal sprays. . Use olopatadine eye drops 0.2% once a day as needed for itchy/watery eyes.  Consider allergy injections for long term control if above medications do not help the symptoms - handout given.

## 2021-11-07 NOTE — Assessment & Plan Note (Signed)
.   See assessment and plan as above. 

## 2021-11-07 NOTE — Assessment & Plan Note (Addendum)
Diagnosed with asthma 10+ years ago. History of blood clots and is on Xarelto. Not sure if Symbicort is helping - she is not sure if she has the 28mcg or 12mcg dose at home.  Today's spirometry showed: possible restrictive disease with 6% improvement in FEV1 post bronchodilator treatment. Clinically feeling improved.  . Daily controller medication(s): Symbicort 138mcg 2 puffs twice a day with spacer and rinse mouth afterwards.  Continue Singulair (montelukast) 10mg  daily at night. . Spacer given and demonstrated proper use with inhaler. Patient understood technique and all questions/concerned were addressed.  . May use albuterol rescue inhaler 2 puffs every 4 to 6 hours as needed for shortness of breath, chest tightness, coughing, and wheezing. May use albuterol rescue inhaler 2 puffs 5 to 15 minutes prior to strenuous physical activities. Monitor frequency of use.  . Get spirometry at next visit.

## 2021-11-07 NOTE — Assessment & Plan Note (Signed)
3-4 courses of antibiotics this year.  Patient does have environmental allergies which can predispose her to getting sinus infections.  Keep track of infections and antibiotics use.  If no improvement, will get bloodwork to look at immune system and refer to ENT to look at sinus anatomy.

## 2021-11-07 NOTE — Patient Instructions (Addendum)
Today's skin testing showed: positive to grass, ragweed, weed, trees.   Environmental allergies Start environmental control measures as below. Use over the counter antihistamines such as Allegra (fexofenadine), or Xyzal (levocetirizine) daily as needed. May take twice a day during allergy flares. May switch antihistamines every few months. Continue Singulair (montelukast) 10mg  daily at night.  Use azelastine nasal spray 1-2 sprays per nostril twice a day as needed for runny nose/drainage. Use Nasacort (triamcinolone) nasal spray 1 spray per nostril twice a day as needed for nasal congestion.  Nasal saline spray (i.e., Simply Saline) or nasal saline lavage (i.e., NeilMed) is recommended as needed and prior to medicated nasal sprays. Use olopatadine eye drops 0.2% once a day as needed for itchy/watery eyes. Consider allergy injections for long term control if above medications do not help the symptoms - handout given.   Asthma: Daily controller medication(s): Symbicort 139mcg 2 puffs twice a day with spacer and rinse mouth afterwards. Spacer given and demonstrated proper use with inhaler. Patient understood technique and all questions/concerned were addressed.  May use albuterol rescue inhaler 2 puffs every 4 to 6 hours as needed for shortness of breath, chest tightness, coughing, and wheezing. May use albuterol rescue inhaler 2 puffs 5 to 15 minutes prior to strenuous physical activities. Monitor frequency of use.  Asthma control goals:  Full participation in all desired activities (may need albuterol before activity) Albuterol use two times or less a week on average (not counting use with activity) Cough interfering with sleep two times or less a month Oral steroids no more than once a year No hospitalizations   Follow up in 3 months or sooner if needed.    Reducing Pollen Exposure Pollen seasons: trees (spring), grass (summer) and ragweed/weeds (fall). Keep windows closed in your home  and car to lower pollen exposure.  Install air conditioning in the bedroom and throughout the house if possible.  Avoid going out in dry windy days - especially early morning. Pollen counts are highest between 5 - 10 AM and on dry, hot and windy days.  Save outside activities for late afternoon or after a heavy rain, when pollen levels are lower.  Avoid mowing of grass if you have grass pollen allergy. Be aware that pollen can also be transported indoors on people and pets.  Dry your clothes in an automatic dryer rather than hanging them outside where they might collect pollen.  Rinse hair and eyes before bedtime.

## 2021-11-11 DIAGNOSIS — M542 Cervicalgia: Secondary | ICD-10-CM | POA: Diagnosis not present

## 2021-11-17 ENCOUNTER — Other Ambulatory Visit: Payer: Self-pay

## 2021-11-17 ENCOUNTER — Encounter: Payer: Self-pay | Admitting: Sports Medicine

## 2021-11-17 ENCOUNTER — Ambulatory Visit (INDEPENDENT_AMBULATORY_CARE_PROVIDER_SITE_OTHER): Payer: Medicare Other | Admitting: Sports Medicine

## 2021-11-17 DIAGNOSIS — B351 Tinea unguium: Secondary | ICD-10-CM | POA: Diagnosis not present

## 2021-11-17 DIAGNOSIS — M79674 Pain in right toe(s): Secondary | ICD-10-CM | POA: Diagnosis not present

## 2021-11-17 DIAGNOSIS — E1165 Type 2 diabetes mellitus with hyperglycemia: Secondary | ICD-10-CM

## 2021-11-17 DIAGNOSIS — M79675 Pain in left toe(s): Secondary | ICD-10-CM | POA: Diagnosis not present

## 2021-11-17 DIAGNOSIS — L608 Other nail disorders: Secondary | ICD-10-CM

## 2021-11-17 NOTE — Progress Notes (Signed)
Subjective: Makayla Frazier is a 50 y.o. female patient with history of diabetes who presents to office today complaining of long,mildly painful nails  while ambulating in shoes; unable to trim. Patient states last A1c 11, FBS 196, elevated because still on steroid for her neck.  Patient reports last visit to PCP was Nov 2022.   Patient Active Problem List   Diagnosis Date Noted   Allergic conjunctivitis of both eyes 11/07/2021   Frequent episodes of sinusitis 11/07/2021   Palpitations 10/06/2021   Diabetic neuropathy ([place]) 08/18/2021   History of epilepsy 08/18/2021   Lumbar radiculopathy 07/04/2021   Pelvic mass 07/04/2021   Type 2 diabetes mellitus with hyperglycemia, with long-term current use of insulin ([place]) 06/09/2021   Dyslipidemia 06/09/2021   Acanthosis nigricans 05/12/2021   Allergic rhinitis due to pollen 05/12/2021   Fear of flying 05/12/2021   Hemorrhoids 05/12/2021   History of 2019 novel coronavirus disease (COVID-19) 05/12/2021   Iron deficiency anemia 05/12/2021   Leukocytosis 05/12/2021   Migraine without aura, not refractory 05/12/2021   Other allergy status, other than to drugs and biological substances 05/12/2021   Polyneuropathy due to type 2 diabetes mellitus ([place]) 05/12/2021   Sciatica 05/12/2021   Low back pain 04/26/2021   Mass of psoas muscle 04/26/2021   DM (diabetes mellitus) ([place])    Obesity    Stroke ([place])    Hypertension    Herpes simplex type 2 infection 10/14/2020   Type 2 diabetes mellitus with hyperglycemia, without long-term current use of insulin ([place]) 12/25/2019   Type 2 diabetes mellitus with diabetic neuropathy, with long-term current use of insulin ([place]) 12/25/2019   Transient loss of consciousness 11/09/2019   Weakness of right leg 11/09/2019   Multiple idiopathic pulmonary cysts 11/09/2019   Hypertensive urgency 11/09/2019   Hyperlipidemia associated with type 2 diabetes mellitus ([place]) 06/27/2018   GAD (generalized anxiety disorder)  08/21/2016   Moderate single current episode of major depressive disorder ([place]) 08/21/2016   Reaction to severe stress 03/07/2016   Stress 03/07/2016   Bleeding per rectum 11/29/2015   Acute pansinusitis 10/20/2015   Candida vaginitis 10/20/2015   Cerebrovascular accident, late effects 08/30/2015   D (diarrhea) 08/02/2015   Loose bowel movement 08/02/2015   Seizure disorder ([place]) 07/13/2015   Adnexal pain 06/08/2015   Right flank pain 05/30/2015   Chronic pulmonary embolism ([place]) 05/14/2015   Long term current use of anticoagulant 05/14/2015   Seizures ([place]) 05/07/2014   Absence of bladder continence 03/27/2014   Arthropathia 02/25/2014   Chronic chest pain 02/25/2014   Healed or old pulmonary embolism 02/25/2014   HLD (hyperlipidemia) 02/25/2014   Headache, migraine 02/25/2014   Obstructive apnea 02/25/2014   Uncontrolled diabetes mellitus with hyperglycemia ([place]) 02/25/2014   Incontinence 02/13/2014   S/P vaginal hysterectomy 01/29/2013   TIA (transient ischemic attack) 08/03/2012   Left-sided weakness 08/03/2012   Hypokalemia 08/03/2012   Chest pain 08/03/2012   Temporary cerebral vascular dysfunction 08/03/2012   History of pulmonary embolism 05/18/2012   Symptomatic mammary hypertrophy 10/12/2011   Patellar tendinitis of left knee 08/21/2011   Knee pain, left 07/07/2011   PRURITUS 07/04/2010   Syncope and collapse 10/18/2009   Other allergic rhinitis 08/11/2009   Unspecified vitamin D deficiency 07/21/2009   Adiposity 07/19/2009   Apnea, sleep 07/19/2009   ANEMIA 07/10/2008   Essential hypertension 07/08/2008   Insulin dependent diabetes mellitus with complications ([place]) 07/37/1062   Peripheral neuropathy 01/22/2008   Asthma 01/22/2008   Mononeuritis 01/22/2008  Current Outpatient Medications on File Prior to Visit  Medication Sig Dispense Refill   acetaminophen (TYLENOL) 500 MG tablet Take 1,000 mg by mouth every 6 (six) hours as needed for moderate pain.       albuterol (PROVENTIL HFA;VENTOLIN HFA) 108 (90 BASE) MCG/ACT inhaler Inhale 2 puffs into the lungs every 6 (six) hours as needed for wheezing or shortness of breath.     ALPRAZolam (XANAX) 0.5 MG tablet alprazolam 0.5 mg tablet  TAKE ONE TABLET BY MOUTH AS NEEDED FOR flight     amitriptyline (ELAVIL) 25 MG tablet 1 tablet at bedtime.     amLODipine (NORVASC) 5 MG tablet Take 5 mg by mouth daily.     atorvastatin (LIPITOR) 10 MG tablet Take 10 mg by mouth at bedtime.     azelastine (ASTELIN) 0.1 % nasal spray Place 1-2 sprays into both nostrils 2 (two) times daily as needed (nasal drainage). Use in each nostril as directed 30 mL 5   benzonatate (TESSALON) 100 MG capsule 1 capsule as needed     budesonide-formoterol (SYMBICORT) 160-4.5 MCG/ACT inhaler Inhale 2 puffs into the lungs in the morning and at bedtime. with spacer and rinse mouth afterwards. 1 each 3   chlorthalidone (HYGROTON) 25 MG tablet TAKE 2 TABLETS(50 MG) BY MOUTH DAILY 180 tablet 0   Continuous Blood Gluc Sensor (FREESTYLE LIBRE 2 SENSOR) MISC 1 Device by Does not apply route as directed. Change every 14 days 6 each 3   COVID-19 mRNA vaccine, Moderna, 100 MCG/0.5ML injection Inject into the muscle. 0.25 mL 0   cyclobenzaprine (FLEXERIL) 10 MG tablet cyclobenzaprine 10 mg tablet     EPINEPHrine 0.3 mg/0.3 mL IJ SOAJ injection Inject 0.3 mg into the muscle as needed for anaphylaxis.      EPINEPHrine 0.3 mg/0.3 mL IJ SOAJ injection See admin instructions.     Fluocinolone Acetonide Body 0.01 % OIL fluocinolone 0.01 % scalp oil and shower cap  APPLY SMALL AMOUNT TOPICALLY TO THE AFFECTED AREA 2 TO 3 TIMES WEEKLY AS DIRECTED     Fluocinolone Acetonide Scalp 0.01 % OIL      fluticasone (FLONASE) 50 MCG/ACT nasal spray Place 1 spray into both nostrils daily as needed (for nasal congestion/sinus infection).      glucose blood test strip 1 each by Other route as needed for other. Use as instructed to test blood sugar  3 times daily E11.65  100 each 11   insulin aspart (NOVOLOG FLEXPEN) 100 UNIT/ML FlexPen 8 units     insulin degludec (TRESIBA FLEXTOUCH) 100 UNIT/ML FlexTouch Pen Inject 24 Units into the skin daily. 30 mL 3   Insulin Pen Needle (NOVOFINE PLUS PEN NEEDLE) 32G X 4 MM MISC Use as directed  daily E11.65 100 each 2   ipratropium (ATROVENT) 0.06 % nasal spray Place 2 sprays into both nostrils 4 (four) times daily.     levocetirizine (XYZAL) 5 MG tablet Take 1 tablet (5 mg total) by mouth every evening. 30 tablet 5   lisinopril (ZESTRIL) 5 MG tablet Take 2.5 mg by mouth daily.     montelukast (SINGULAIR) 10 MG tablet Take 10 mg by mouth at bedtime. Reported on 02/16/2016     ondansetron (ZOFRAN ODT) 4 MG disintegrating tablet Take 1 tablet (4 mg total) by mouth every 8 (eight) hours as needed for nausea or vomiting. 20 tablet 0   pioglitazone (ACTOS) 30 MG tablet Take 1 tablet (30 mg total) by mouth daily. 90 tablet 3   pregabalin (LYRICA)  100 MG capsule Take 1 capsule (100 mg total) by mouth 3 (three) times daily. 90 capsule 5   rivaroxaban (XARELTO) 20 MG TABS tablet Take 20 mg by mouth at bedtime.     terconazole (TERAZOL 7) 0.4 % vaginal cream SMARTSIG:1 Applicator Vaginal Daily     topiramate (TOPAMAX) 25 MG tablet Take 1 tablet (25 mg total) by mouth at bedtime. 90 tablet 3   triamcinolone (NASACORT) 55 MCG/ACT AERO nasal inhaler 1 spray in each nostril     TRULICITY 3 VA/9.1BT SOPN INJECT 3mg  SUBCUTANEOUSLY ONCE A WEEK 2 mL 2   No current facility-administered medications on file prior to visit.   Allergies  Allergen Reactions   Silver Other (See Comments) and Rash   Canagliflozin Other (See Comments)   Codeine Nausea And Vomiting    Pt takes Percocet without problems    Darvocet [Propoxyphene N-Acetaminophen] Nausea And Vomiting   Glipizide     Diarrhea and abdominal pain    Hydrocodone-Acetaminophen Nausea And Vomiting   Lantus [Insulin Glargine]     Burning at injection site    Liraglutide     Other  reaction(s): upset stomach   Metformin Hcl     Other reaction(s): diarrhea   Propoxyphene     Other reaction(s): nausea   Hydrocodone-Acetaminophen Nausea And Vomiting    Vomiting   Latex Rash   Tape Itching and Rash   Tramadol Nausea Only    Recent Results (from the past 2160 hour(s))  POCT glycosylated hemoglobin (Hb A1C)     Status: Abnormal   Collection Time: 09/09/21  8:12 AM  Result Value Ref Range   Hemoglobin A1C 11.4 (A) 4.0 - 5.6 %   HbA1c POC (<> result, manual entry)     HbA1c, POC (prediabetic range)     HbA1c, POC (controlled diabetic range)      Objective: General: Patient is awake, alert, and oriented x 3 and in no acute distress.  Integument: Skin is warm, dry and supple bilateral. Nails are tender, long, thickened and dystrophic with subungual debris, consistent with onychomycosis, 1-5 bilateral. No signs of infection. No open lesions or preulcerative lesions present bilateral. Remaining integument unremarkable.  Vasculature:  Dorsalis Pedis pulse 2/4 bilateral. Posterior Tibial pulse  2/4 bilateral.  Capillary fill time <3 sec 1-5 bilateral. Positive hair growth to the level of the digits. Temperature gradient within normal limits. No varicosities present bilateral. No edema present bilateral.   Neurology: Intact, unchanged from prior, subjective occassional tingling.   Musculoskeletal: No symptomatic pedal deformities noted bilateral.  Bunion deformity noted but currently asymptomatic as previous.  Muscular strength 5/5 in all lower extremity muscular groups bilateral without pain on range of motion . No tenderness with calf compression bilateral.  Assessment and Plan: Problem List Items Addressed This Visit       Endocrine   Type 2 diabetes mellitus with hyperglycemia, without long-term current use of insulin ([place])   Other Visit Diagnoses     Pain due to onychomycosis of toenails of both feet    -  Primary   Hemorrhage of nail           -Examined  patient. -Discussed and educated patient again on diabetic foot care and the importance of glycemic control. -Mechanically debrided all nails 1-5 bilateral using sterile nail nipper and filed with dremel without incident  -Answered all patient questions -Patient to return  in 3 months for at risk foot care -Patient advised to call the office if any  problems or questions arise in the meantime.  Landis Martins, DPM

## 2021-11-19 DIAGNOSIS — E118 Type 2 diabetes mellitus with unspecified complications: Secondary | ICD-10-CM | POA: Diagnosis not present

## 2021-11-19 DIAGNOSIS — Z794 Long term (current) use of insulin: Secondary | ICD-10-CM | POA: Diagnosis not present

## 2021-11-22 ENCOUNTER — Telehealth: Payer: Self-pay | Admitting: *Deleted

## 2021-11-22 NOTE — Telephone Encounter (Signed)
Patient with diagnosis of recurrent PE on Xarelto for anticoagulation.  Pulmonary embolism in 2010 and 2013.  Procedure: CERVICAL NERVE ROOT BLOCK   Date of procedure: 11/26/21  Does not look like we prescribe Xarelto.  Recommend forwarding to PCP.

## 2021-11-22 NOTE — Telephone Encounter (Signed)
° °  Pre-operative Risk Assessment    Patient Name: Makayla Frazier  DOB: [birth year] MRN: [id number]      Request for Surgical Clearance    Procedure:   CERVICAL NERVE ROOT BLOCK  Date of Surgery:  Clearance 11/26/21                                 Surgeon:  DR. RONALD GIOFFRE Surgeon's Group or Practice Name:  [facility] Phone number:  [phone] EXT [phone] Fax number:  [phone]   Type of Clearance Requested:   - Medical  - Pharmacy:  Hold Rivaroxaban (Xarelto) x 3 DAYS PRIOR   Type of Anesthesia:  Not Indicated   Additional requests/questions:    Jiles Prows   11/22/2021, 11:42 AM

## 2021-11-22 NOTE — Telephone Encounter (Signed)
Patient is on Xarelto for recurrent PE.

## 2021-11-22 NOTE — Telephone Encounter (Signed)
Attempts at reaching both the patient and her husband were unsuccessful, unable to leave voicemail with either phone

## 2021-11-23 NOTE — Telephone Encounter (Signed)
Await callback from patient. In the meantime I will route to Dr. Marlou Porch for input whether he is comfortable providing a recommendation regarding holding her anticoagulation since he mentioned in his last note that her PCP asked him to manage her Xarelto. (Dr. Marlou Porch, see pharmD commentary below for nerve block.) If not will plan to recommend surgeon's office contact PCP for recommendation. Dr. Marlou Porch - Please route response to P CV DIV PREOP (the pre-op pool). Thank you.

## 2021-11-23 NOTE — Telephone Encounter (Addendum)
° °  Name: Makayla Frazier  DOB: 09-04-1971  MRN: [id number]   Primary Cardiologist: Candee Furbish, MD  Chart reviewed as part of pre-operative protocol coverage. Patient returned call 11/23/2021 in reference to pre-operative risk assessment for pending surgery as outlined below.  Makayla Frazier was last seen on 09/2021 by Dr. Marlou Porch with history of TIA 2013, DVT 2011, recurrent PE in 2013. No history of CAD. She had a remote history of syncope. Stress test at outside facility 2016 was negative for inducible ischemia. Prior echo 10/2019 EF 60-65%, grade 1 DD, no significant valve stenosis/regurgitation. Per Dr. Marlou Porch' last note, he stated ""Her primary physician also asked if we would be comfortable managing her Xarelto.  This is fine.  I agree with lifelong Xarelto given her recurrent PE." I reached out to patient for update on how she is doing. The patient affirms she has been doing well without any new cardiac symptoms. Therefore, based on ACC/AHA guidelines, the patient would be at acceptable risk for the planned procedure without further cardiovascular testing. The patient was advised that if she develops new symptoms prior to surgery to contact our office to arrange for a follow-up visit, and she verbalized understanding. I've put in a msg to Dr. Marlou Porch to call me so that we can review recommendation for anticoagulation since he had agreed to take over her management by last office visit. She has not yet taken her Xarelto today as she takes it in the evening. She did confirm procedure date is Saturday 11/26/21. She gave me permission for [place] to leave detailed VM on her phone number listed if needed upon return call.  ADDENDUM 11/23/2021. 3:13 PM: spoke with Dr. Marlou Porch who has provided clearance for this patient to hold her Xarelto for 3 days prior to her procedure. He does recommend to resume the day after (we need to minimize the total time she would off anticoagulation given her prior medical history).  He did not feel bridging was needed at this time. Will route this message to requesting provider, but will also route to callback team to let patient know as well - patient did give permission to leave detailed VM if needed to begin holding Xarelto today (she takes QPM). Can also send reply on her MyChart if she doesn't answer.  Charlie Pitter, PA-C 11/23/2021, 1:51 PM

## 2021-11-23 NOTE — Telephone Encounter (Signed)
Will send pt msg in MyChart to call us back since unable to hear back from patient yet. As below from [place] will route back to pharm for input on anticoag.

## 2021-11-23 NOTE — Telephone Encounter (Signed)
We haven't really been managing this, we have never prescribed her Xarelto (PCP continues to) and pt is at elevated risk of VTE given prior PE in 2009 and 2011 and DVT in 2011 and 2013. She would require 3 day Xarelto hold in order to have cervical nerve root block. Would still prefer that PCP weigh in, otherwise would need cardiologist clearance given longer anticoag hold request and high pt VTE risk.

## 2021-11-23 NOTE — Telephone Encounter (Signed)
Per Melina Copa, PA-C, ok to leave message on voicemail.  Left a detailed message to let her know to hold her Xarelto starting today, not taking todays dose, and it has been recommended for her to restart the day after her procedure. Let her know that we have sent the recommendations to the surgeon's office as well, and if she had any questions, she can call the office.

## 2021-11-23 NOTE — Telephone Encounter (Signed)
Addressed in phone note from today.

## 2021-11-23 NOTE — Telephone Encounter (Signed)
Spoke with pt and confirmed that she received my message regarding her Xarelto, and she confirms. All, if any, questions were answered.

## 2021-11-23 NOTE — Telephone Encounter (Signed)
Left a message for the patient to call back and speak with on call preop APP. (Unlike yesterday, her phone allowed me to leave a message now)  Previous office note by Dr. Marlou Porch mentioned "Her primary physician also asked if we would be comfortable managing her Xarelto.  This is fine.  I agree with lifelong Xarelto given her recurrent PE."

## 2021-11-26 DIAGNOSIS — M5412 Radiculopathy, cervical region: Secondary | ICD-10-CM | POA: Diagnosis not present

## 2021-12-07 DIAGNOSIS — R1900 Intra-abdominal and pelvic swelling, mass and lump, unspecified site: Secondary | ICD-10-CM | POA: Diagnosis not present

## 2021-12-08 DIAGNOSIS — R1900 Intra-abdominal and pelvic swelling, mass and lump, unspecified site: Secondary | ICD-10-CM | POA: Diagnosis not present

## 2021-12-14 DIAGNOSIS — E118 Type 2 diabetes mellitus with unspecified complications: Secondary | ICD-10-CM | POA: Diagnosis not present

## 2021-12-14 DIAGNOSIS — Z794 Long term (current) use of insulin: Secondary | ICD-10-CM | POA: Diagnosis not present

## 2021-12-16 ENCOUNTER — Encounter: Payer: Self-pay | Admitting: Internal Medicine

## 2021-12-16 ENCOUNTER — Other Ambulatory Visit: Payer: Self-pay

## 2021-12-16 ENCOUNTER — Ambulatory Visit (INDEPENDENT_AMBULATORY_CARE_PROVIDER_SITE_OTHER): Payer: Medicare Other | Admitting: Internal Medicine

## 2021-12-16 VITALS — BP 124/80 | HR 85 | Wt 162.0 lb

## 2021-12-16 DIAGNOSIS — Z794 Long term (current) use of insulin: Secondary | ICD-10-CM | POA: Diagnosis not present

## 2021-12-16 DIAGNOSIS — E1165 Type 2 diabetes mellitus with hyperglycemia: Secondary | ICD-10-CM

## 2021-12-16 DIAGNOSIS — E1140 Type 2 diabetes mellitus with diabetic neuropathy, unspecified: Secondary | ICD-10-CM

## 2021-12-16 LAB — POCT GLYCOSYLATED HEMOGLOBIN (HGB A1C): Hemoglobin A1C: 9.6 % — AB (ref 4.0–5.6)

## 2021-12-16 MED ORDER — INSULIN PEN NEEDLE 31G X 5 MM MISC: 1.0000 | Freq: Four times a day (QID) | 3 refills | Status: DC

## 2021-12-16 MED ORDER — TRESIBA FLEXTOUCH 100 UNIT/ML ~~LOC~~ SOPN: 30.0000 [IU] | PEN_INJECTOR | Freq: Every day | SUBCUTANEOUS | 3 refills | Status: DC

## 2021-12-16 MED ORDER — NOVOLOG FLEXPEN 100 UNIT/ML ~~LOC~~ SOPN: PEN_INJECTOR | SUBCUTANEOUS | 3 refills | Status: DC

## 2021-12-16 NOTE — Progress Notes (Signed)
Name: Makayla Frazier  Age/ Sex: 51 y.o., female   MRN/ DOB: [id number], 1971/06/11     PCP: Perlie Mayo Durel Salts   Reason for Endocrinology Evaluation: Type 2 Diabetes Mellitus  Initial Endocrine Consultative Visit:  12/25/2019    PATIENT IDENTIFIER: Makayla Frazier is a 51 y.o. female with a past medical history of T2DM, HTN and seizure disorder . The patient has followed with Endocrinology clinic since 12/25/2019 for consultative assistance with management of her diabetes.  DIABETIC HISTORY:  Ms. Manganelli was diagnosed with T2DM 2009.  She was initially on glipizide and Metformin, but developed diarrhea.  She is intolerant to Victoza due to rash.  Invokana because genital infections. Her hemoglobin A1c has ranged from  7.0% in 2011, peaking at 11.3% in 3013.   On her initial visit to our clinic she had an A1c of 2.1%, she was on Trulicity only.  Glipizide was added  Glipizide switched to basal insulin in 03/2020 due to worsening hyperglycemia.    Pioglitazone started 05/2021  SUBJECTIVE:   During the last visit (09/09/2021): A1c 11.4%, we increased  Actos and continued Trulicity and insulin    Today (12/16/2021): [name] is here for a follow up on diabetes management.  She checked her blood sugars 0 times a day. The patient is not known to have hypoglycemic episodes since the last clinic visit.    Denies nausea or diarrhea   Follows with Irena   She had glucocorticoid injection in the neck for pinched nerve   HOME DIABETES REGIMEN:  Tresiba 24 units daily  Trulicity 3 mg weekly (Fridays)  Pioglitazone 30 mg daily     Statin: Yes ACE-I/ARB: Yes     CONTINUOUS GLUCOSE MONITORING RECORD INTERPRETATION    Dates of Recording: 12/24-12/16/2021  Sensor description:freestyle libre  Results statistics:   CGM use % of time 33  Average and SD 263/32.2  Time in range   21  %  % Time Above 180 14  % Time above 250 65  % Time Below target 0      Glycemic patterns summary: hyperglycemia during the day and night   Hyperglycemic episodes  postprandial  Hypoglycemic episodes occurred n/a  Overnight periods:remains elevated       DIABETIC COMPLICATIONS: Microvascular complications:  Neuropathy Denies: CKD , retinopathy Last eye exam: Completed 2022   Macrovascular complications:  TIA Denies: CAD, PVD, CVA  HISTORY:  Past Medical History:  Past Medical History:  Diagnosis Date   Anemia    Arthritis    Left leg   Asthma    BV (bacterial vaginosis) 1941   Complication of anesthesia    Seizures in PACU after surgery 3/15   DM (diabetes mellitus) ([place])    diagnosed 2009   DVT (deep venous thrombosis) ([place]) 2011   pt had IUD; also 05/2012   Dysfunctional uterine bleeding    Seen by Dr. Leo Grosser, GYN; pending hysterectomy as of 07/2012   H/O hypercoagulable state    2/2 contraception   Headache(784.0)    migraines   Herpes simplex without mention of complication 06/4080   HSV-2   HLD (hyperlipidemia)    Hypertension    Labial lesion 2013   Major depression    Hx suicide attempt in 2009, [facility] admissions   Mastodynia    Neuromuscular disorder ([place])    neuropathy   Neuropathy    Obesity    Oligomenorrhea 2011   PE (pulmonary embolism)    2009 -  on oral contraception; multiple   Post - coital bleeding 2012   Seizures ([place]) 2015   last episode May 2015   Sleep apnea    Status post placement of implantable loop recorder    Stroke ([place])    TIA 2013   Syncopal episodes    unknown etiology   Thyroid disease    hypothyroidism (pt denies)   Vaginal Pap smear, abnormal    Venous insufficiency    Vitamin D deficiency    unknown to pt.   Past Surgical History:  Past Surgical History:  Procedure Laterality Date   BILATERAL SALPINGECTOMY  12/18/2012   Procedure: BILATERAL SALPINGECTOMY;  Surgeon: Eldred Manges, MD;  Location: [facility];  Service: Gynecology;  Laterality: Bilateral;   BLADDER SUSPENSION  N/A 02/13/2014   Procedure: TRANSVAGINAL TAPE (TVT) PROCEDURE;  Surgeon: Delice Lesch, MD;  Location: [facility];  Service: Gynecology;  Laterality: N/A;   BREAST REDUCTION SURGERY     Dr. Eugene Garnet [facility]     Implantable Loop recorder; no arrhythmias associated with synocpal spells; loop explanted 07-2013   CHOLECYSTECTOMY     DILATATION & CURRETTAGE/HYSTEROSCOPY WITH RESECTOCOPE  2007 & 2013   ENDOMETRIAL BIOPSY  2011   IUD REMOVAL  12/18/2012   Procedure: INTRAUTERINE DEVICE (IUD) REMOVAL;  Surgeon: Eldred Manges, MD;  Location: [facility];  Service: Gynecology;  Laterality: N/A;  Removed during prep by E. Powell PA   LAPAROSCOPIC ASSISTED VAGINAL HYSTERECTOMY  12/18/2012   Procedure: LAPAROSCOPIC ASSISTED VAGINAL HYSTERECTOMY;  Surgeon: Eldred Manges, MD;  Location: [facility];  Service: Gynecology;  Laterality: N/A;   LOOP RECORDER EXPLANT N/A 07/17/2013   Procedure: LOOP RECORDER EXPLANT;  Surgeon: Thompson Grayer, MD;  Location: [facility];  Service: Cardiovascular;  Laterality: N/A;   TRANSVAGINAL TAPE (TVT) REMOVAL N/A 04/28/2014   Procedure: Removal of suburethral mesh;  Surgeon: Delice Lesch, MD;  Location: [facility];  Service: Gynecology;  Laterality: N/A;   WISDOM TOOTH EXTRACTION     Social History:  reports that she has never smoked. She has never used smokeless tobacco. She reports current alcohol use. She reports that she does not use drugs. Family History:  Family History  Problem Relation Age of Onset   Melanoma Father 52   Diabetes Father    Hypertension Father    Kidney disease Father    Ulcerative colitis Mother 65   Diabetes Mother    Hypertension Mother    Breast cancer Paternal Grandmother    Cancer Paternal Grandmother    Diabetes type II Maternal Grandmother        deceased 52   Diabetes Maternal Grandmother    Pulmonary embolism Paternal Grandfather    Stroke Maternal Grandfather      HOME MEDICATIONS: Allergies as  of 12/16/2021       Reactions   Silver Other (See Comments), Rash   Canagliflozin Other (See Comments)   Codeine Nausea And Vomiting   Pt takes Percocet without problems   Darvocet [propoxyphene N-acetaminophen] Nausea And Vomiting   Glipizide    Diarrhea and abdominal pain    Hydrocodone-acetaminophen Nausea And Vomiting   Lantus [insulin Glargine]    Burning at injection site    Liraglutide    Other reaction(s): upset stomach   Metformin Hcl    Other reaction(s): diarrhea   Propoxyphene    Other reaction(s): nausea   Hydrocodone-acetaminophen Nausea And Vomiting   Vomiting   Latex  Rash   Tape Itching, Rash   Tramadol Nausea Only        Medication List        Accurate as of December 16, 2021 10:16 AM. If you have any questions, ask your nurse or doctor.          STOP taking these medications    benzonatate 100 MG capsule Commonly known as: TESSALON Stopped by: Dorita Sciara, MD       TAKE these medications    acetaminophen 500 MG tablet Commonly known as: TYLENOL Take 1,000 mg by mouth every 6 (six) hours as needed for moderate pain.   albuterol 108 (90 Base) MCG/ACT inhaler Commonly known as: VENTOLIN HFA Inhale 2 puffs into the lungs every 6 (six) hours as needed for wheezing or shortness of breath.   ALPRAZolam 0.5 MG tablet Commonly known as: XANAX alprazolam 0.5 mg tablet  TAKE ONE TABLET BY MOUTH AS NEEDED FOR flight   amitriptyline 25 MG tablet Commonly known as: ELAVIL 1 tablet at bedtime.   amLODipine 5 MG tablet Commonly known as: NORVASC Take 5 mg by mouth daily.   atorvastatin 10 MG tablet Commonly known as: LIPITOR Take 10 mg by mouth at bedtime.   azelastine 0.1 % nasal spray Commonly known as: ASTELIN Place 1-2 sprays into both nostrils 2 (two) times daily as needed (nasal drainage). Use in each nostril as directed   budesonide-formoterol 160-4.5 MCG/ACT inhaler Commonly known as: Symbicort Inhale 2 puffs into the  lungs in the morning and at bedtime. with spacer and rinse mouth afterwards.   chlorthalidone 25 MG tablet Commonly known as: HYGROTON TAKE 2 TABLETS(50 MG) BY MOUTH DAILY   cyclobenzaprine 10 MG tablet Commonly known as: FLEXERIL cyclobenzaprine 10 mg tablet   EPINEPHrine 0.3 mg/0.3 mL Soaj injection Commonly known as: EPI-PEN See admin instructions.   EPINEPHrine 0.3 mg/0.3 mL Soaj injection Commonly known as: EPI-PEN Inject 0.3 mg into the muscle as needed for anaphylaxis.   Fluocinolone Acetonide Body 0.01 % Oil fluocinolone 0.01 % scalp oil and shower cap  APPLY SMALL AMOUNT TOPICALLY TO THE AFFECTED AREA 2 TO 3 TIMES WEEKLY AS DIRECTED   Fluocinolone Acetonide Scalp 0.01 % Oil   fluticasone 50 MCG/ACT nasal spray Commonly known as: FLONASE Place 1 spray into both nostrils daily as needed (for nasal congestion/sinus infection).   FreeStyle Libre 2 Sensor Misc 1 Device by Does not apply route as directed. Change every 14 days   glucose blood test strip 1 each by Other route as needed for other. Use as instructed to test blood sugar  3 times daily E11.65   Insulin Pen Needle 31G X 5 MM Misc 1 Device by Does not apply route 4 (four) times daily. What changed:  medication strength how much to take how to take this when to take this additional instructions Changed by: Dorita Sciara, MD   ipratropium 0.06 % nasal spray Commonly known as: ATROVENT Place 2 sprays into both nostrils 4 (four) times daily.   levocetirizine 5 MG tablet Commonly known as: XYZAL Take 1 tablet (5 mg total) by mouth every evening.   lisinopril 5 MG tablet Commonly known as: ZESTRIL Take 2.5 mg by mouth daily.   Moderna COVID-19 Vaccine 100 MCG/0.5ML injection Generic drug: COVID-19 mRNA vaccine (Moderna) Inject into the muscle.   montelukast 10 MG tablet Commonly known as: SINGULAIR Take 10 mg by mouth at bedtime. Reported on 02/16/2016   NovoLOG FlexPen 100 UNIT/ML  FlexPen Generic  drug: insulin aspart Max daily 40 units What changed: See the new instructions. Changed by: Dorita Sciara, MD   ondansetron 4 MG disintegrating tablet Commonly known as: Zofran ODT Take 1 tablet (4 mg total) by mouth every 8 (eight) hours as needed for nausea or vomiting.   oxyCODONE-acetaminophen 5-325 MG tablet Commonly known as: PERCOCET/ROXICET Take 1 tablet by mouth every 6 (six) hours as needed for severe pain.   pioglitazone 30 MG tablet Commonly known as: Actos Take 1 tablet (30 mg total) by mouth daily.   pregabalin 100 MG capsule Commonly known as: LYRICA Take 1 capsule (100 mg total) by mouth 3 (three) times daily. What changed:  how much to take when to take this   terconazole 0.4 % vaginal cream Commonly known as: TERAZOL 7 SMARTSIG:1 Applicator Vaginal Daily   topiramate 25 MG tablet Commonly known as: TOPAMAX Take 1 tablet (25 mg total) by mouth at bedtime.   Tyler Aas FlexTouch 100 UNIT/ML FlexTouch Pen Generic drug: insulin degludec Inject 30 Units into the skin daily. What changed: how much to take Changed by: Dorita Sciara, MD   triamcinolone 55 MCG/ACT Aero nasal inhaler Commonly known as: NASACORT 1 spray in each nostril   Trulicity 3 QQ/5.9DG Sopn Generic drug: Dulaglutide INJECT 3mg  SUBCUTANEOUSLY ONCE A WEEK   Xarelto 20 MG Tabs tablet Generic drug: rivaroxaban Take 20 mg by mouth at bedtime.         OBJECTIVE:   Vital Signs: BP 124/80 (BP Location: Left Arm, Patient Position: Sitting, Cuff Size: Small)    Pulse 85    Wt 162 lb (73.5 kg)    LMP 11/06/2012    SpO2 99%    BMI 31.64 kg/m   Wt Readings from Last 3 Encounters:  12/16/21 162 lb (73.5 kg)  11/07/21 160 lb (72.6 kg)  10/06/21 158 lb (71.7 kg)     Exam: General: Pt appears well and is in NAD  Lungs: Clear with good BS bilat with no rales, rhonchi, or wheezes  Heart: RRR   Extremities: No pretibial edema.   Neuro: MS is good with  appropriate affect, pt is alert and Ox3   DM foot exam: 06/09/2021   The skin of the feet is intact without sores or ulcerations. The pedal pulses are 2+ on right and 2+ on left. The sensation is decreased  to a screening 5.07, 10 gram monofilament bilaterally     DATA REVIEWED:  Lab Results  Component Value Date   HGBA1C 9.6 (A) 12/16/2021   HGBA1C 11.4 (A) 09/09/2021   HGBA1C 14.5 (H) 06/09/2021  Results for VERNICE, MANNINA (MRN [id number]) as of 06/10/2021 15:23  Ref. Range 06/09/2021 08:13  Sodium Latest Ref Range: 135 - 145 mEq/L 138  Potassium Latest Ref Range: 3.5 - 5.1 mEq/L 3.0 (L)  Chloride Latest Ref Range: 96 - 112 mEq/L 98  CO2 Latest Ref Range: 19 - 32 mEq/L 32  Glucose Latest Ref Range: 70 - 99 mg/dL 265 (H)  BUN Latest Ref Range: 6 - 23 mg/dL 14  Creatinine Latest Ref Range: 0.40 - 1.20 mg/dL 1.09  Calcium Latest Ref Range: 8.4 - 10.5 mg/dL 9.4  GFR Latest Ref Range: >60.00 mL/min 59.61 (L)  Total CHOL/HDL Ratio Unknown 3  Cholesterol Latest Ref Range: 0 - 200 mg/dL 145  HDL Cholesterol Latest Ref Range: >39.00 mg/dL 42.40  LDL (calc) Latest Ref Range: 0 - 99 mg/dL 65  MICROALB/CREAT RATIO Latest Ref Range: 0.0 - 30.0 mg/g 1.2  NonHDL Unknown 102.51  Triglycerides Latest Ref Range: 0.0 - 149.0 mg/dL 189.0 (H)  VLDL Latest Ref Range: 0.0 - 40.0 mg/dL 37.8  Hemoglobin A1C Latest Ref Range: 4.6 - 6.5 % 14.5 (H)  Creatinine,U Latest Units: mg/dL 278.7  Microalb, Ur Latest Ref Range: 0.0 - 1.9 mg/dL 3.4 (H)    ASSESSMENT / PLAN / RECOMMENDATIONS:   Type 2 Diabetes Mellitus, Poorly controlled, With neuropathic complications - Most recent A1c of 9.6 %. Goal A1c <7.0%.   - A1c continues to trend down from 11.4% to 9.6 % but still above goal of 7.0 %  - She is pending foot sx and waiting on glycemic optimization  - Will increase her basal insulin and start her on prandial insulin as well    MEDICATIONS:  Increase Tresiba to 30  units daily  continue Trulicity  3 mg weekly  Continue pioglitazone 30  mg daily Start Novolog per correction scale ( BG-120/25) TIDQAC  EDUCATION / INSTRUCTIONS: BG monitoring instructions: Patient is instructed to check her blood sugars 3 times a day, before meals  I reviewed the Rule of 15 for the treatment of hypoglycemia in detail with the patient. Literature supplied.   2) Diabetic complications:  Eye: Does not have known diabetic retinopathy.  Neuro/ Feet: Does have known diabetic peripheral neuropathy. Renal: Patient does not have known baseline CKD. She is on an ACEI/ARB at present.  F/U in 4 months     Signed electronically by: Mack Guise, MD  [facility] Endocrinology  [facility] [address] [place], [place] [address] Phone: [phone] FAX: [phone]   CC: [address] [place] [address] Phone: [phone]  Fax: [phone]  Return to Endocrinology clinic as below: Future Appointments  Date Time Provider [place]  01/16/2022  8:30 AM Cameron Sprang, MD [facility] None  02/08/2022 10:15 AM Garnet Sierras, DO [facility] None  02/16/2022  9:15 AM Landis Martins, DPM [facility]  04/21/2022  9:30 AM Tajay Muzzy, Melanie Crazier, MD [facility] None

## 2021-12-16 NOTE — Patient Instructions (Addendum)
°-   Increase Tresiba to 30  units once daily  -Continue Trulicity 3 mg weekly  -Continue Actos 30 mg daily  -Novolog correctional insulin:  Use the scale below to help guide you before each meal   Blood sugar before meal Number of units to inject  Less than 145 0 unit  146 -  170 1 units  171 -  195 2 units  196 -  220 3 units  221 -  245 4 units  246 -  270 5 units  271 -  295 6 units  296 -  320 7 units  321 -  345 8 units  346 - 370 9 units  371 - 395 10 units  396 - 420 11 units       -HOW TO TREAT LOW BLOOD SUGARS (Blood sugar LESS THAN 70 MG/DL) Please follow the RULE OF 15 for the treatment of hypoglycemia treatment (when your (blood sugars are less than 70 mg/dL)   STEP 1: Take 15 grams of carbohydrates when your blood sugar is low, which includes:  3-4 GLUCOSE TABS  OR 3-4 OZ OF JUICE OR REGULAR SODA OR ONE TUBE OF GLUCOSE GEL    STEP 2: RECHECK blood sugar in 15 MINUTES STEP 3: If your blood sugar is still low at the 15 minute recheck --> then, go back to STEP 1 and treat AGAIN with another 15 grams of carbohydrates.

## 2021-12-21 ENCOUNTER — Other Ambulatory Visit: Payer: Self-pay

## 2021-12-21 ENCOUNTER — Ambulatory Visit
Admission: RE | Admit: 2021-12-21 | Discharge: 2021-12-21 | Disposition: A | Discharge status: Home / Self Care | Payer: Medicare Other | Source: Ambulatory Visit | Attending: Physician Assistant | Admitting: Physician Assistant

## 2021-12-21 ENCOUNTER — Encounter: Payer: Self-pay | Admitting: Internal Medicine

## 2021-12-21 VITALS — BP 93/54 | HR 90 | Temp 98.1°F | Resp 18

## 2021-12-21 DIAGNOSIS — R1030 Lower abdominal pain, unspecified: Secondary | ICD-10-CM | POA: Insufficient documentation

## 2021-12-21 LAB — POCT URINALYSIS DIP (MANUAL ENTRY)
Bilirubin, UA: NEGATIVE
Blood, UA: NEGATIVE
Glucose, UA: 500 mg/dL — AB
Ketones, POC UA: NEGATIVE mg/dL
Leukocytes, UA: NEGATIVE
Nitrite, UA: NEGATIVE
Protein Ur, POC: NEGATIVE mg/dL
Spec Grav, UA: 1.015 (ref 1.010–1.025)
Urobilinogen, UA: 0.2 E.U./dL
pH, UA: 6 (ref 5.0–8.0)

## 2021-12-21 LAB — POCT URINE PREGNANCY: Preg Test, Ur: NEGATIVE

## 2021-12-21 NOTE — ED Provider Notes (Signed)
[name]    CSN: [id number] Arrival date & time: 12/21/21  0900      History   Chief Complaint Chief Complaint  Patient presents with   Abdominal Pain    HPI Makayla Frazier is a 51 y.o. female.   Patient here today for evaluation of lower abdominal pain that is been ongoing for the last week.  She reports that she has not had any nausea but has had some constipation.  She has had some discharge as well.  She does not report any dysuria.  She does report history of ovarian cysts.  She has history of hysterectomy.  She denies any fever.  She does not report treatment for symptoms.  The history is provided by the patient.   Past Medical History:  Diagnosis Date   Anemia    Arthritis    Left leg   Asthma    BV (bacterial vaginosis) 1062   Complication of anesthesia    Seizures in PACU after surgery 3/15   DM (diabetes mellitus) ([place])    diagnosed 2009   DVT (deep venous thrombosis) ([place]) 2011   pt had IUD; also 05/2012   Dysfunctional uterine bleeding    Seen by Dr. Leo Grosser, GYN; pending hysterectomy as of 07/2012   H/O hypercoagulable state    2/2 contraception   Headache(784.0)    migraines   Herpes simplex without mention of complication 05/9484   HSV-2   HLD (hyperlipidemia)    Hypertension    Labial lesion 2013   Major depression    Hx suicide attempt in 2009, [facility] admissions   Mastodynia    Neuromuscular disorder ([place])    neuropathy   Neuropathy    Obesity    Oligomenorrhea 2011   PE (pulmonary embolism)    2009 - on oral contraception; multiple   Post - coital bleeding 2012   Seizures ([place]) 2015   last episode May 2015   Sleep apnea    Status post placement of implantable loop recorder    Stroke ([place])    TIA 2013   Syncopal episodes    unknown etiology   Thyroid disease    hypothyroidism (pt denies)   Vaginal Pap smear, abnormal    Venous insufficiency    Vitamin D deficiency    unknown to pt.    Patient Active Problem List    Diagnosis Date Noted   Allergic conjunctivitis of both eyes 11/07/2021   Frequent episodes of sinusitis 11/07/2021   Palpitations 10/06/2021   Diabetic neuropathy ([place]) 08/18/2021   History of epilepsy 08/18/2021   Lumbar radiculopathy 07/04/2021   Pelvic mass 07/04/2021   Type 2 diabetes mellitus with hyperglycemia, with long-term current use of insulin ([place]) 06/09/2021   Dyslipidemia 06/09/2021   Acanthosis nigricans 05/12/2021   Allergic rhinitis due to pollen 05/12/2021   Fear of flying 05/12/2021   Hemorrhoids 05/12/2021   History of 2019 novel coronavirus disease (COVID-19) 05/12/2021   Iron deficiency anemia 05/12/2021   Leukocytosis 05/12/2021   Migraine without aura, not refractory 05/12/2021   Other allergy status, other than to drugs and biological substances 05/12/2021   Polyneuropathy due to type 2 diabetes mellitus ([place]) 05/12/2021   Sciatica 05/12/2021   Low back pain 04/26/2021   Mass of psoas muscle 04/26/2021   DM (diabetes mellitus) ([place])    Obesity    Stroke ([place])    Hypertension    Herpes simplex type 2 infection 10/14/2020   Type 2 diabetes mellitus with  hyperglycemia, without long-term current use of insulin ([place]) 12/25/2019   Type 2 diabetes mellitus with diabetic neuropathy, with long-term current use of insulin ([place]) 12/25/2019   Transient loss of consciousness 11/09/2019   Weakness of right leg 11/09/2019   Multiple idiopathic pulmonary cysts 11/09/2019   Hypertensive urgency 11/09/2019   Hyperlipidemia associated with type 2 diabetes mellitus ([place]) 06/27/2018   GAD (generalized anxiety disorder) 08/21/2016   Moderate single current episode of major depressive disorder ([place]) 08/21/2016   Reaction to severe stress 03/07/2016   Stress 03/07/2016   Bleeding per rectum 11/29/2015   Acute pansinusitis 10/20/2015   Candida vaginitis 10/20/2015   Cerebrovascular accident, late effects 08/30/2015   D (diarrhea) 08/02/2015   Loose bowel movement  08/02/2015   Seizure disorder ([place]) 07/13/2015   Adnexal pain 06/08/2015   Right flank pain 05/30/2015   Chronic pulmonary embolism ([place]) 05/14/2015   Long term current use of anticoagulant 05/14/2015   Seizures ([place]) 05/07/2014   Absence of bladder continence 03/27/2014   Arthropathia 02/25/2014   Chronic chest pain 02/25/2014   Healed or old pulmonary embolism 02/25/2014   HLD (hyperlipidemia) 02/25/2014   Headache, migraine 02/25/2014   Obstructive apnea 02/25/2014   Uncontrolled diabetes mellitus with hyperglycemia ([place]) 02/25/2014   Incontinence 02/13/2014   S/P vaginal hysterectomy 01/29/2013   TIA (transient ischemic attack) 08/03/2012   Left-sided weakness 08/03/2012   Hypokalemia 08/03/2012   Chest pain 08/03/2012   Temporary cerebral vascular dysfunction 08/03/2012   History of pulmonary embolism 05/18/2012   Symptomatic mammary hypertrophy 10/12/2011   Patellar tendinitis of left knee 08/21/2011   Knee pain, left 07/07/2011   PRURITUS 07/04/2010   Syncope and collapse 10/18/2009   Other allergic rhinitis 08/11/2009   Unspecified vitamin D deficiency 07/21/2009   Adiposity 07/19/2009   Apnea, sleep 07/19/2009   ANEMIA 07/10/2008   Essential hypertension 07/08/2008   Insulin dependent diabetes mellitus with complications ([place]) 73/71/0626   Peripheral neuropathy 01/22/2008   Asthma 01/22/2008   Mononeuritis 01/22/2008    Past Surgical History:  Procedure Laterality Date   BILATERAL SALPINGECTOMY  12/18/2012   Procedure: BILATERAL SALPINGECTOMY;  Surgeon: Eldred Manges, MD;  Location: [facility];  Service: Gynecology;  Laterality: Bilateral;   BLADDER SUSPENSION N/A 02/13/2014   Procedure: TRANSVAGINAL TAPE (TVT) PROCEDURE;  Surgeon: Delice Lesch, MD;  Location: [facility];  Service: Gynecology;  Laterality: N/A;   BREAST REDUCTION SURGERY     Dr. Eugene Garnet [place] [facility]     Implantable Loop recorder; no arrhythmias associated  with synocpal spells; loop explanted 07-2013   CHOLECYSTECTOMY     DILATATION & CURRETTAGE/HYSTEROSCOPY WITH RESECTOCOPE  2007 & 2013   ENDOMETRIAL BIOPSY  2011   IUD REMOVAL  12/18/2012   Procedure: INTRAUTERINE DEVICE (IUD) REMOVAL;  Surgeon: Eldred Manges, MD;  Location: [facility];  Service: Gynecology;  Laterality: N/A;  Removed during prep by E. Powell PA   LAPAROSCOPIC ASSISTED VAGINAL HYSTERECTOMY  12/18/2012   Procedure: LAPAROSCOPIC ASSISTED VAGINAL HYSTERECTOMY;  Surgeon: Eldred Manges, MD;  Location: [facility];  Service: Gynecology;  Laterality: N/A;   LOOP RECORDER EXPLANT N/A 07/17/2013   Procedure: LOOP RECORDER EXPLANT;  Surgeon: Thompson Grayer, MD;  Location: [facility] CATH LAB;  Service: Cardiovascular;  Laterality: N/A;   TRANSVAGINAL TAPE (TVT) REMOVAL N/A 04/28/2014   Procedure: Removal of suburethral mesh;  Surgeon: Delice Lesch, MD;  Location: [facility];  Service: Gynecology;  Laterality: N/A;   WISDOM  TOOTH EXTRACTION      OB History     Gravida  0   Para  0   Term      Preterm      AB      Living         SAB      IAB      Ectopic      Multiple      Live Births               Home Medications    Prior to Admission medications   Medication Sig Start Date End Date Taking? Authorizing Provider  acetaminophen (TYLENOL) 500 MG tablet Take 1,000 mg by mouth every 6 (six) hours as needed for moderate pain.     [provider]  albuterol (PROVENTIL HFA;VENTOLIN HFA) 108 (90 BASE) MCG/ACT inhaler Inhale 2 puffs into the lungs every 6 (six) hours as needed for wheezing or shortness of breath.    [provider]  ALPRAZolam (XANAX) 0.5 MG tablet alprazolam 0.5 mg tablet  TAKE ONE TABLET BY MOUTH AS NEEDED FOR flight    [provider]  amitriptyline (ELAVIL) 25 MG tablet 1 tablet at bedtime.    [provider]  amLODipine (NORVASC) 5 MG tablet Take 5 mg by mouth daily.    [provider]  atorvastatin (LIPITOR) 10 MG  tablet Take 10 mg by mouth at bedtime.    [provider]  azelastine (ASTELIN) 0.1 % nasal spray Place 1-2 sprays into both nostrils 2 (two) times daily as needed (nasal drainage). Use in each nostril as directed 11/07/21   Garnet Sierras, DO  budesonide-formoterol [facility]) 160-4.5 MCG/ACT inhaler Inhale 2 puffs into the lungs in the morning and at bedtime. with spacer and rinse mouth afterwards. 11/07/21   Garnet Sierras, DO  chlorthalidone (HYGROTON) 25 MG tablet TAKE 2 TABLETS(50 MG) BY MOUTH DAILY 07/21/19   Harvie Heck, MD  Continuous Blood Gluc Sensor (FREESTYLE LIBRE 2 SENSOR) MISC 1 Device by Does not apply route as directed. Change every 14 days 06/09/21   Shamleffer, Melanie Crazier, MD  COVID-19 mRNA vaccine, Moderna, 100 MCG/0.5ML injection Inject into the muscle. 07/04/21   Carlyle Basques, MD  cyclobenzaprine (FLEXERIL) 10 MG tablet cyclobenzaprine 10 mg tablet    [provider]  EPINEPHrine 0.3 mg/0.3 mL IJ SOAJ injection Inject 0.3 mg into the muscle as needed for anaphylaxis.  04/30/18   [provider]  EPINEPHrine 0.3 mg/0.3 mL IJ SOAJ injection See admin instructions.    [provider]  Fluocinolone Acetonide Body 0.01 % OIL fluocinolone 0.01 % scalp oil and shower cap  APPLY SMALL AMOUNT TOPICALLY TO THE AFFECTED AREA 2 TO 3 TIMES WEEKLY AS DIRECTED    [provider]  Fluocinolone Acetonide Scalp 0.01 % OIL  03/08/21   [provider]  fluticasone (FLONASE) 50 MCG/ACT nasal spray Place 1 spray into both nostrils daily as needed (for nasal congestion/sinus infection).  10/18/15   [provider]  glucose blood test strip 1 each by Other route as needed for other. Use as instructed to test blood sugar  3 times daily E11.65 03/29/20   Shamleffer, Melanie Crazier, MD  insulin aspart (NOVOLOG FLEXPEN) 100 UNIT/ML FlexPen Max daily 40 units 12/16/21   Shamleffer, Melanie Crazier, MD  insulin degludec (TRESIBA FLEXTOUCH) 100 UNIT/ML  FlexTouch Pen Inject 30 Units into the skin daily. 12/16/21   Shamleffer, Melanie Crazier, MD  Insulin Pen Needle 31G  X 5 MM MISC 1 Device by Does not apply route 4 (four) times daily. 12/16/21   Shamleffer, Melanie Crazier, MD  ipratropium (ATROVENT) 0.06 % nasal spray Place 2 sprays into both nostrils 4 (four) times daily. 06/30/20   [provider]  levocetirizine (XYZAL) 5 MG tablet Take 1 tablet (5 mg total) by mouth every evening. 11/07/21   Garnet Sierras, DO  lisinopril (ZESTRIL) 5 MG tablet Take 2.5 mg by mouth daily. 10/29/19   [provider]  montelukast (SINGULAIR) 10 MG tablet Take 10 mg by mouth at bedtime. Reported on 02/16/2016 04/13/15   [provider]  ondansetron (ZOFRAN ODT) 4 MG disintegrating tablet Take 1 tablet (4 mg total) by mouth every 8 (eight) hours as needed for nausea or vomiting. 07/09/20   Maudie Flakes, MD  oxyCODONE-acetaminophen (PERCOCET/ROXICET) 5-325 MG tablet Take 1 tablet by mouth every 6 (six) hours as needed for severe pain.    [provider]  pioglitazone (ACTOS) 30 MG tablet Take 1 tablet (30 mg total) by mouth daily. 09/09/21   Shamleffer, Melanie Crazier, MD  pregabalin (LYRICA) 100 MG capsule Take 1 capsule (100 mg total) by mouth 3 (three) times daily. Patient taking differently: Take 150 mg by mouth daily. 09/13/21   Shamleffer, Melanie Crazier, MD  rivaroxaban (XARELTO) 20 MG TABS tablet Take 20 mg by mouth at bedtime.    [provider]  terconazole (TERAZOL 7) 0.4 % vaginal cream SMARTSIG:1 Applicator Vaginal Daily 05/23/20   [provider]  topiramate (TOPAMAX) 25 MG tablet Take 1 tablet (25 mg total) by mouth at bedtime. 06/29/21   Cameron Sprang, MD  triamcinolone (NASACORT) 55 MCG/ACT AERO nasal inhaler 1 spray in each nostril 04/07/20   [provider]  TRULICITY 3 VE/7.2CN SOPN INJECT 3mg  SUBCUTANEOUSLY ONCE A WEEK 10/03/21   Shamleffer, Melanie Crazier, MD    Family History Family  History  Problem Relation Age of Onset   Melanoma Father 33   Diabetes Father    Hypertension Father    Kidney disease Father    Ulcerative colitis Mother 45   Diabetes Mother    Hypertension Mother    Breast cancer Paternal Grandmother    Cancer Paternal Grandmother    Diabetes type II Maternal Grandmother        deceased 74   Diabetes Maternal Grandmother    Pulmonary embolism Paternal Grandfather    Stroke Maternal Grandfather     Social History Social History   Tobacco Use   Smoking status: Never   Smokeless tobacco: Never  Vaping Use   Vaping Use: Never used  Substance Use Topics   Alcohol use: Yes    Comment: once in a while    Drug use: No     Allergies   Silver, Canagliflozin, Codeine, Darvocet [propoxyphene n-acetaminophen], Glipizide, Hydrocodone-acetaminophen, Lantus [insulin glargine], Liraglutide, Metformin hcl, Propoxyphene, Hydrocodone-acetaminophen, Latex, Tape, and Tramadol   Review of Systems Review of Systems  Constitutional:  Negative for chills and fever.  Eyes:  Negative for discharge and redness.  Respiratory:  Negative for shortness of breath.   Gastrointestinal:  Positive for abdominal pain and constipation. Negative for blood in stool, diarrhea, nausea and vomiting.  Genitourinary:  Positive for vaginal discharge.    Physical Exam Triage Vital Signs ED Triage Vitals  Enc Vitals Group     BP      Pulse      Resp      Temp  Temp src      SpO2      Weight      Height      Head Circumference      Peak Flow      Pain Score      Pain Loc      Pain Edu?      Excl. in [place]?    No data found.  Updated Vital Signs BP (!) 93/54 (BP Location: Left Arm)    Pulse 90    Temp 98.1 F (36.7 C) (Oral)    Resp 18    LMP 11/06/2012    SpO2 97%      Physical Exam Vitals and nursing note reviewed.  Constitutional:      General: She is not in acute distress.    Appearance: Normal appearance. She is not ill-appearing.  HENT:     Head:  Normocephalic and atraumatic.     Nose: Nose normal.  Cardiovascular:     Rate and Rhythm: Normal rate and regular rhythm.     Heart sounds: Normal heart sounds. No murmur heard. Pulmonary:     Effort: Pulmonary effort is normal. No respiratory distress.     Breath sounds: Normal breath sounds. No wheezing, rhonchi or rales.  Abdominal:     General: Abdomen is flat. Bowel sounds are normal. There is no distension.     Palpations: Abdomen is soft.     Tenderness: There is abdominal tenderness (very mild diffuse TTP). There is no guarding.  Skin:    General: Skin is warm and dry.  Neurological:     Mental Status: She is alert.  Psychiatric:        Mood and Affect: Mood normal.        Thought Content: Thought content normal.     UC Treatments / Results  Labs (all labs ordered are listed, but only abnormal results are displayed) Labs Reviewed  POCT URINALYSIS DIP (MANUAL ENTRY) - Abnormal; Notable for the following components:      Result Value   Glucose, UA =500 (*)    All other components within normal limits  POCT URINE PREGNANCY  CERVICOVAGINAL ANCILLARY ONLY    EKG   Radiology No results found.  Procedures Procedures (including critical care time)  Medications Ordered in UC Medications - No data to display  Initial Impression / Assessment and Plan / UC Course  I have reviewed the triage vital signs and the nursing notes.  Pertinent labs & imaging results that were available during my care of the patient were reviewed by me and considered in my medical decision making (see chart for details).    Unknown etiology of pain- UA normal. Will order STD screening. Encouraged follow up here or with OB/GYN with any concerns given history of cysts. Patient expresses understanding.  Final Clinical Impressions(s) / UC Diagnoses   Final diagnoses:  Lower abdominal pain   Discharge Instructions   None    ED Prescriptions   None    PDMP not reviewed this  encounter.   Francene Finders, PA-C 12/21/21 1042

## 2021-12-21 NOTE — ED Triage Notes (Signed)
Pt c/o abd pain, "a little diarrhea yesterday but mostly constipation." Pt points to lower abd when asked where worse pain is. Also c/o lower back pain. C/o vaginal discharge and itching.   Denies nausea, vomiting,  Onset ~ 1 week ago but worsened yesterday.

## 2021-12-22 ENCOUNTER — Other Ambulatory Visit: Payer: Self-pay

## 2021-12-22 DIAGNOSIS — R109 Unspecified abdominal pain: Secondary | ICD-10-CM | POA: Diagnosis not present

## 2021-12-22 LAB — CERVICOVAGINAL ANCILLARY ONLY
Bacterial Vaginitis (gardnerella): NEGATIVE
Candida Glabrata: NEGATIVE
Candida Vaginitis: NEGATIVE
Chlamydia: NEGATIVE
Comment: NEGATIVE
Comment: NEGATIVE
Comment: NEGATIVE
Comment: NEGATIVE
Comment: NEGATIVE
Comment: NORMAL
Neisseria Gonorrhea: NEGATIVE
Trichomonas: NEGATIVE

## 2021-12-22 MED ORDER — NOVOLOG FLEXPEN 100 UNIT/ML ~~LOC~~ SOPN: PEN_INJECTOR | SUBCUTANEOUS | 3 refills | Status: DC

## 2021-12-22 NOTE — Telephone Encounter (Signed)
Script sent  

## 2021-12-23 DIAGNOSIS — M542 Cervicalgia: Secondary | ICD-10-CM | POA: Diagnosis not present

## 2022-01-07 ENCOUNTER — Other Ambulatory Visit: Payer: Self-pay | Admitting: Internal Medicine

## 2022-01-09 DIAGNOSIS — J45909 Unspecified asthma, uncomplicated: Secondary | ICD-10-CM | POA: Diagnosis not present

## 2022-01-09 DIAGNOSIS — E1165 Type 2 diabetes mellitus with hyperglycemia: Secondary | ICD-10-CM | POA: Diagnosis not present

## 2022-01-09 DIAGNOSIS — I10 Essential (primary) hypertension: Secondary | ICD-10-CM | POA: Diagnosis not present

## 2022-01-09 DIAGNOSIS — J4531 Mild persistent asthma with (acute) exacerbation: Secondary | ICD-10-CM | POA: Diagnosis not present

## 2022-01-09 DIAGNOSIS — G43009 Migraine without aura, not intractable, without status migrainosus: Secondary | ICD-10-CM | POA: Diagnosis not present

## 2022-01-09 DIAGNOSIS — E785 Hyperlipidemia, unspecified: Secondary | ICD-10-CM | POA: Diagnosis not present

## 2022-01-09 DIAGNOSIS — E1142 Type 2 diabetes mellitus with diabetic polyneuropathy: Secondary | ICD-10-CM | POA: Diagnosis not present

## 2022-01-10 ENCOUNTER — Other Ambulatory Visit (HOSPITAL_COMMUNITY): Payer: Self-pay | Admitting: Obstetrics & Gynecology

## 2022-01-10 ENCOUNTER — Other Ambulatory Visit: Payer: Self-pay | Admitting: Obstetrics & Gynecology

## 2022-01-10 ENCOUNTER — Ambulatory Visit (HOSPITAL_COMMUNITY)
Admission: RE | Admit: 2022-01-10 | Discharge: 2022-01-10 | Disposition: A | Discharge status: Home / Self Care | Payer: Medicare Other | Source: Ambulatory Visit | Attending: Obstetrics & Gynecology | Admitting: Obstetrics & Gynecology

## 2022-01-10 DIAGNOSIS — R1900 Intra-abdominal and pelvic swelling, mass and lump, unspecified site: Secondary | ICD-10-CM | POA: Insufficient documentation

## 2022-01-10 DIAGNOSIS — K689 Other disorders of retroperitoneum: Secondary | ICD-10-CM | POA: Diagnosis not present

## 2022-01-10 DIAGNOSIS — E119 Type 2 diabetes mellitus without complications: Secondary | ICD-10-CM | POA: Diagnosis not present

## 2022-01-10 DIAGNOSIS — D181 Lymphangioma, any site: Secondary | ICD-10-CM | POA: Insufficient documentation

## 2022-01-10 DIAGNOSIS — K668 Other specified disorders of peritoneum: Secondary | ICD-10-CM | POA: Diagnosis not present

## 2022-01-10 DIAGNOSIS — R1907 Generalized intra-abdominal and pelvic swelling, mass and lump: Secondary | ICD-10-CM | POA: Diagnosis not present

## 2022-01-10 DIAGNOSIS — G4733 Obstructive sleep apnea (adult) (pediatric): Secondary | ICD-10-CM | POA: Diagnosis not present

## 2022-01-10 MED ORDER — GADOBUTROL 1 MMOL/ML IV SOLN
7.5000 mL | Freq: Once | INTRAVENOUS | Status: AC | PRN
  Administered 2022-01-10: 7.5 mL via INTRAVENOUS

## 2022-01-13 ENCOUNTER — Telehealth: Payer: Self-pay

## 2022-01-13 ENCOUNTER — Other Ambulatory Visit (HOSPITAL_COMMUNITY): Payer: Self-pay

## 2022-01-13 NOTE — Telephone Encounter (Signed)
PA needed for Novolog

## 2022-01-16 ENCOUNTER — Ambulatory Visit: Payer: Medicare Other | Admitting: Neurology

## 2022-01-16 MED ORDER — INSULIN LISPRO (1 UNIT DIAL) 100 UNIT/ML (KWIKPEN): PEN_INJECTOR | SUBCUTANEOUS | 3 refills | Status: DC

## 2022-01-16 NOTE — Telephone Encounter (Signed)
Humalog has been sent and patient notified of change

## 2022-01-16 NOTE — Addendum Note (Signed)
Addended by: Jefferson Fuel on: 01/16/2022 08:18 AM   Modules accepted: Orders

## 2022-01-24 ENCOUNTER — Ambulatory Visit (INDEPENDENT_AMBULATORY_CARE_PROVIDER_SITE_OTHER): Payer: Medicare Other | Admitting: Neurology

## 2022-01-24 ENCOUNTER — Encounter: Payer: Self-pay | Admitting: Neurology

## 2022-01-24 ENCOUNTER — Ambulatory Visit: Payer: Medicare Other | Admitting: Neurology

## 2022-01-24 ENCOUNTER — Other Ambulatory Visit: Payer: Self-pay

## 2022-01-24 VITALS — BP 132/88 | HR 87 | Ht 60.0 in | Wt 164.8 lb

## 2022-01-24 DIAGNOSIS — G40009 Localization-related (focal) (partial) idiopathic epilepsy and epileptic syndromes with seizures of localized onset, not intractable, without status epilepticus: Secondary | ICD-10-CM

## 2022-01-24 DIAGNOSIS — G43009 Migraine without aura, not intractable, without status migrainosus: Secondary | ICD-10-CM | POA: Diagnosis not present

## 2022-01-24 MED ORDER — TOPIRAMATE 25 MG PO TABS: ORAL_TABLET | ORAL | 3 refills | Status: DC

## 2022-01-24 MED ORDER — TOPIRAMATE 25 MG PO TABS: ORAL_TABLET | ORAL | 0 refills | Status: DC

## 2022-01-24 NOTE — Progress Notes (Signed)
NEUROLOGY FOLLOW UP OFFICE NOTE  Makayla Frazier [id number] 1971-11-02  HISTORY OF PRESENT ILLNESS: I had the pleasure of seeing Makayla Frazier in follow-up in the neurology clinic on 01/24/2022.  The patient was last seen 7 months ago for seizures and migraines. MRI brain and routine EEG unremarkable. She is on Topiramate 25mg  qhs for migraine propyhlaxis with good response. Since her last visit, she reports the migraines come and go, she has around 2 a month. No associated nausea/vomiting, she does not take any prn medication and just sleeps it off. She had a couple last night and last week. She continues to deny any episodes of loss of consciousness since 10/2019.  Her husband was previously reporting nocturnal shaking episodes, none since 2021, however he tells her she has been clenching/grinding her teeth, she bites down and wakes up with her mouth/tongue sore. She has told her dentist about it. It occurs more than 2 times a week, no urinary incontinence. She denies any staring/unresponsive episodes, gaps in time, myoclonic jerks. She states she has a lot of stuff going on, she was found to have a pelvic mass and a pinched nerve in her neck. She was given Prednisone, the cortisone injection helped some, now there is a nagging pain when she turns a certain way. She is on Lyrica 100mg  TID for neuropathy, prescribed by her PCP.   History on Initial Assessment 11/20/2019: This is a 51 year old right-handed woman with a history of hypertension, hyperlipidemia, thyroid disease, depression, PE on Xarelto, presenting for evaluation of seizures. Records from her prior neurologist were reviewed, she was last seen at [facility] in 2016. Per records she started having seizures in 2009, although she recalled passing out spells when younger. Seizures were described as feeling hot then passing at, at times with shaking. She has a bad headache after. She had a cardiology evaluation with a loop recorder  reportedly normal. She was started on Topiramate at one point, however with abrupt increase from 25mg  BID to 100mg  BID, she had significant cognitive side effects. She has been on Topiramate 25mg  at bedtime for several years for migraine prophylaxis. She reports her last seizure was in December 2015 when she had tremors in her sleep (not convulsions). There is a normal EEG report from 2015. She continued to report nocturnal events and was started on Levetiracetam 500mg  BID. Since starting Levetiracetam, the nocturnal shaking episodes decreased, and she did not have any daytime episodes until she had an episode of shaking with incontinence 7 months ago, then she was brought to [facility] after an episode of loss of consciousness on 11/08/2019. She recalls feeling a pull on her chest, then woke up on the ground. No tongue bite or incontinence, her husband did not witness any shaking. She was a little out of it after with a slight headache, her right arm and leg were weaker and numb, she was dragging her leg like it was deadweight. It took 1-2 days to feel normal. I personally reviewed MRI brain without contrast which did not show any acute changes, hippocampi symmetric, partially empty sella. Her wake and sleep EEG was within normal limits. She reported that she had cut down Levetiracetam to 500mg  at bedtime a year ago due to drowsiness and nausea, however since hospital discharge she is back to taking 500mg  BID with nausea medication. Her husband has mentioned episodes where she would stare off or get a little confused, none recently. She has occasional mild tremors in both hands.  She denies any olfactory/gustatory hallucinations. She reports similar transient right-sided weakness after a spell last year. She has been doing PT and was told by PT yesterday to use a walker. She denies any neck/back pain.  She has occasional migraines with good response to low dose Topiramate 25mg  at bedtime. When off medications, she gets  very photosensitive. Last migraine was last summer. She takes Maxalt prn. She has been on Pregabalin 100mg  TID for neuropathy over the past year, which helps with numbness/tingling in both feet. In the hospital, she had urinary retention, MRI thoracic and lumbar spine were unremarkable, urinary retention has resolved. She has a lot of constipation. She reports a sleep study where she was told she "stopped breathing 10x/hr but did not start CPAP." Her last sleep study in 2015 reportedly showed OSA. She recalls having a prolonged EEG in the past but with no results due to technical difficulties.   Epilepsy Risk Factors:  Maternal grandfather and some cousins had seizures. Otherwise she had a normal birth and early development.  There is no history of febrile convulsions, CNS infections such as meningitis/encephalitis, significant traumatic brain injury, neurosurgical procedures.  Prior AEDs: Keppra    PAST MEDICAL HISTORY: Past Medical History:  Diagnosis Date   Anemia    Arthritis    Left leg   Asthma    BV (bacterial vaginosis) 1610   Complication of anesthesia    Seizures in PACU after surgery 3/15   DM (diabetes mellitus) ([place])    diagnosed 2009   DVT (deep venous thrombosis) ([place]) 2011   pt had IUD; also 05/2012   Dysfunctional uterine bleeding    Seen by Dr. Leo Grosser, GYN; pending hysterectomy as of 07/2012   H/O hypercoagulable state    2/2 contraception   Headache(784.0)    migraines   Herpes simplex without mention of complication 08/6044   HSV-2   HLD (hyperlipidemia)    Hypertension    Labial lesion 2013   Major depression    Hx suicide attempt in 2009, [facility] admissions   Mastodynia    Neuromuscular disorder ([place])    neuropathy   Neuropathy    Obesity    Oligomenorrhea 2011   PE (pulmonary embolism)    2009 - on oral contraception; multiple   Post - coital bleeding 2012   Seizures ([place]) 2015   last episode May 2015   Sleep apnea    Status post placement of implantable  loop recorder    Stroke ([place])    TIA 2013   Syncopal episodes    unknown etiology   Thyroid disease    hypothyroidism (pt denies)   Vaginal Pap smear, abnormal    Venous insufficiency    Vitamin D deficiency    unknown to pt.    MEDICATIONS: Current Outpatient Medications on File Prior to Visit  Medication Sig Dispense Refill   acetaminophen (TYLENOL) 500 MG tablet Take 1,000 mg by mouth every 6 (six) hours as needed for moderate pain.      albuterol (PROVENTIL HFA;VENTOLIN HFA) 108 (90 BASE) MCG/ACT inhaler Inhale 2 puffs into the lungs every 6 (six) hours as needed for wheezing or shortness of breath.     ALPRAZolam (XANAX) 0.5 MG tablet alprazolam 0.5 mg tablet  TAKE ONE TABLET BY MOUTH AS NEEDED FOR flight     amitriptyline (ELAVIL) 25 MG tablet 1 tablet at bedtime.     amLODipine (NORVASC) 5 MG tablet Take 5 mg by mouth daily.     atorvastatin (  LIPITOR) 10 MG tablet Take 10 mg by mouth at bedtime.     azelastine (ASTELIN) 0.1 % nasal spray Place 1-2 sprays into both nostrils 2 (two) times daily as needed (nasal drainage). Use in each nostril as directed 30 mL 5   budesonide-formoterol (SYMBICORT) 160-4.5 MCG/ACT inhaler Inhale 2 puffs into the lungs in the morning and at bedtime. with spacer and rinse mouth afterwards. 1 each 3   chlorthalidone (HYGROTON) 25 MG tablet TAKE 2 TABLETS(50 MG) BY MOUTH DAILY 180 tablet 0   Continuous Blood Gluc Sensor (FREESTYLE LIBRE 2 SENSOR) MISC 1 Device by Does not apply route as directed. Change every 14 days 6 each 3   cyclobenzaprine (FLEXERIL) 10 MG tablet cyclobenzaprine 10 mg tablet     EPINEPHrine 0.3 mg/0.3 mL IJ SOAJ injection Inject 0.3 mg into the muscle as needed for anaphylaxis.      EPINEPHrine 0.3 mg/0.3 mL IJ SOAJ injection See admin instructions.     Fluocinolone Acetonide Body 0.01 % OIL fluocinolone 0.01 % scalp oil and shower cap  APPLY SMALL AMOUNT TOPICALLY TO THE AFFECTED AREA 2 TO 3 TIMES WEEKLY AS DIRECTED      Fluocinolone Acetonide Scalp 0.01 % OIL      fluticasone (FLONASE) 50 MCG/ACT nasal spray Place 1 spray into both nostrils daily as needed (for nasal congestion/sinus infection).      glucose blood test strip 1 each by Other route as needed for other. Use as instructed to test blood sugar  3 times daily E11.65 100 each 11   insulin degludec (TRESIBA FLEXTOUCH) 100 UNIT/ML FlexTouch Pen Inject 30 Units into the skin daily. 30 mL 3   insulin lispro (HUMALOG KWIKPEN) 100 UNIT/ML KwikPen 40 units max daily dose 30 mL 3   Insulin Pen Needle 31G X 5 MM MISC 1 Device by Does not apply route 4 (four) times daily. 400 each 3   levocetirizine (XYZAL) 5 MG tablet Take 1 tablet (5 mg total) by mouth every evening. 30 tablet 5   lisinopril (ZESTRIL) 5 MG tablet Take 2.5 mg by mouth daily.     montelukast (SINGULAIR) 10 MG tablet Take 10 mg by mouth at bedtime. Reported on 02/16/2016     ondansetron (ZOFRAN ODT) 4 MG disintegrating tablet Take 1 tablet (4 mg total) by mouth every 8 (eight) hours as needed for nausea or vomiting. 20 tablet 0   oxyCODONE-acetaminophen (PERCOCET/ROXICET) 5-325 MG tablet Take 1 tablet by mouth every 6 (six) hours as needed for severe pain.     pioglitazone (ACTOS) 30 MG tablet Take 1 tablet (30 mg total) by mouth daily. 90 tablet 3   pregabalin (LYRICA) 100 MG capsule Take 1 capsule (100 mg total) by mouth 3 (three) times daily. (Patient taking differently: Take 150 mg by mouth daily. Takes 150mg ) 90 capsule 5   rivaroxaban (XARELTO) 20 MG TABS tablet Take 20 mg by mouth at bedtime.     topiramate (TOPAMAX) 25 MG tablet Take 1 tablet (25 mg total) by mouth at bedtime. 90 tablet 3   triamcinolone (NASACORT) 55 MCG/ACT AERO nasal inhaler 1 spray in each nostril     TRULICITY 3 WS/5.6CL SOPN INJECT 3mg  into THE SKIN ONCE A WEEK 2 mL 2   No current facility-administered medications on file prior to visit.    ALLERGIES: Allergies  Allergen Reactions   Silver Other (See Comments) and  Rash   Canagliflozin Other (See Comments)   Codeine Nausea And Vomiting    Pt takes  Percocet without problems    Darvocet [Propoxyphene N-Acetaminophen] Nausea And Vomiting   Glipizide     Diarrhea and abdominal pain    Hydrocodone-Acetaminophen Nausea And Vomiting   Lantus [Insulin Glargine]     Burning at injection site    Liraglutide     Other reaction(s): upset stomach   Metformin Hcl     Other reaction(s): diarrhea   Propoxyphene     Other reaction(s): nausea   Hydrocodone-Acetaminophen Nausea And Vomiting    Vomiting   Latex Rash   Tape Itching and Rash   Tramadol Nausea Only    FAMILY HISTORY: Family History  Problem Relation Age of Onset   Melanoma Father 68   Diabetes Father    Hypertension Father    Kidney disease Father    Ulcerative colitis Mother 39   Diabetes Mother    Hypertension Mother    Breast cancer Paternal Grandmother    Cancer Paternal Grandmother    Diabetes type II Maternal Grandmother        deceased 6   Diabetes Maternal Grandmother    Pulmonary embolism Paternal Grandfather    Stroke Maternal Grandfather     SOCIAL HISTORY: Social History   Socioeconomic History   Marital status: Married    Spouse name: Not on file   Number of children: Not on file   Years of education: Not on file   Highest education level: Not on file  Occupational History   Occupation: International aid/development worker: UNEMPLOYED    Comment: Disabled [id number] due to PE complications  Tobacco Use   Smoking status: Never   Smokeless tobacco: Never  Vaping Use   Vaping Use: Never used  Substance and Sexual Activity   Alcohol use: Not Currently    Comment: once in a while    Drug use: No   Sexual activity: Yes    Partners: Male    Birth control/protection: Surgical  Other Topics Concern   Not on file  Social History Narrative   Lives in [place]   Lives with significant other, Gwyndolyn Saxon   Completed 10th grade.   Worker Compensation Case [id number] related to PE   Right  handed       Epworth Sleepiness Scale      Total score:  13      --I have high BP   --I have had a stroke   --I have had insomnia   --I have been told that I stop breathing during sleep   --I wake up to urinate frequently at night   --I wake up with a dry mouth and a sore throat   --I have Diabetes   --I have been told that I snore   --I am overweight or am gaining weight   --I have problems with memory/concentration   --I awake feeling not rested   --I frequently awake with headaches   --I have dozed off or fallen asleep at inappropriate times during the day   Social Determinants of Health   Financial Resource Strain: Not on file  Food Insecurity: Not on file  Transportation Needs: Not on file  Physical Activity: Not on file  Stress: Not on file  Social Connections: Not on file  Intimate Partner Violence: Not on file     PHYSICAL EXAM: Vitals:   01/24/22 0932  BP: 132/88  Pulse: 87  SpO2: 99%   General: No acute distress Head:  Normocephalic/atraumatic Skin/Extremities: No rash, no edema Neurological Exam: alert and awake.  No aphasia or dysarthria. Fund of knowledge is appropriate. Attention and concentration are normal.   Cranial nerves: Pupils equal, round. Extraocular movements intact with no nystagmus. Visual fields full.  No facial asymmetry.  Motor: Bulk and tone normal, muscle strength 5/5 throughout with no pronator drift. Reflexes +2 on right UE, +1 left UE, +1 both LE. Finger to nose testing intact.  Gait narrow-based and steady, no ataxia.    IMPRESSION: This is a 51 yo RH woman with a history of hypertension, hyperlipidemia, thyroid disease, depression, PE on Xarelto, migraines, neuropathy, with recurrent seizures. She reports a history of nocturnal shaking episodes, as well as episodes of loss of consciousness with post-event right-sided weakness suggestive of focal to bilateral tonic-clonic seizures arising from the left hemisphere. MRI brain and routine  EEG unremarkable. No episodes of loss of consciousness since 10/2019. No nocturnal shaking episodes however she reports jaw clenching/grinding in sleep, etiology unclear, although seizures are a possibility, bruxism is also considered. We discussed doing a 48-hour EEG to assess for nocturnal seizures, she would like to try increasing Topiramate dose to 50mg  qhs for now and monitor for response. If no change, she will consider 48-hour EEG. She is also on Lyrica 100mg  TID for neuropathy prescribed by her PCP. She is aware of [place] driving laws to stop driving after a seizure until 6 months seizure-free. Follow-up in 6 months, call for any changes.   Thank you for allowing me to participate in her care.  Please do not hesitate to call for any questions or concerns.    Ellouise Newer, M.D.   CC: Dr. Jonelle Sidle

## 2022-01-24 NOTE — Patient Instructions (Signed)
Good to see you.   Increase Topiramate 25mg : Take 2 tablets every night  2. Keep a calendar of the times you wake up with sore mouth/tongue and see if they decrease as you take higher dose of Topiramate.  3. Consider 48-hour EEG  4. Follow-up in 6 months, call for any changes   Seizure Precautions: 1. If medication has been prescribed for you to prevent seizures, take it exactly as directed.  Do not stop taking the medicine without talking to your doctor first, even if you have not had a seizure in a long time.   2. Avoid activities in which a seizure would cause danger to yourself or to others.  Don't operate dangerous machinery, swim alone, or climb in high or dangerous places, such as on ladders, roofs, or girders.  Do not drive unless your doctor says you may.  3. If you have any warning that you may have a seizure, lay down in a safe place where you can't hurt yourself.    4.  No driving for 6 months from last seizure, as per [facility].   Please refer to the following link on the [facility] website for more information: http://[email or web address]/[email or web address]/drivingu.cfm   5.  Maintain good sleep hygiene. Avoid alcohol.  6.  Contact your doctor if you have any problems that may be related to the medicine you are taking.  7.  Call 911 and bring the patient back to the ED if:        A.  The seizure lasts longer than 5 minutes.       B.  The patient doesn't awaken shortly after the seizure  C.  The patient has new problems such as difficulty seeing, speaking or moving  D.  The patient was injured during the seizure  E.  The patient has a temperature over 102 F (39C)  F.  The patient vomited and now is having trouble breathing

## 2022-02-06 ENCOUNTER — Other Ambulatory Visit: Payer: Self-pay | Admitting: Internal Medicine

## 2022-02-07 NOTE — Progress Notes (Unsigned)
Follow Up Note  RE: Makayla Frazier MRN: [id number] DOB: Nov 11, 1971 Date of Office Visit: 02/08/2022  Referring provider: Filiberto Pinks Primary care provider: Elwyn Reach, MD  Chief Complaint: No chief complaint on file.  History of Present Illness: I had the pleasure of seeing Makayla Frazier for a follow up visit at the [facility] on 02/07/2022. She is a 51 y.o. female, who is being followed for allergic rhinoconjunctivitis, frequent sinusitis, asthma. Her previous allergy office visit was on 11/07/2021 with Dr. Maudie Mercury. Today is a regular follow up visit.  Other allergic rhinitis Rhino conjunctivitis symptoms mainly in the summer for the past 5-6 years. Tried zyrtec, Claritin, Singulair, Nasacort, Atrovent with minimal benefit. 3-4 courses of antibiotics. Skin testing int he past was positive to grass per patient report. No prior AIT. No prior ENT evaluation.  Today's skin testing showed: positive to grass, ragweed, weed, trees.  Start environmental control measures as below. Use over the counter antihistamines such as Allegra (fexofenadine), or Xyzal (levocetirizine) daily as needed. May take twice a day during allergy flares. May switch antihistamines every few months. Continue Singulair (montelukast) 10mg  daily at night. Use azelastine nasal spray 1-2 sprays per nostril twice a day as needed for runny nose/drainage. Use Nasacort (triamcinolone) nasal spray 1 spray per nostril twice a day as needed for nasal congestion.  Nasal saline spray (i.e., Simply Saline) or nasal saline lavage (i.e., NeilMed) is recommended as needed and prior to medicated nasal sprays. Use olopatadine eye drops 0.2% once a day as needed for itchy/watery eyes. Consider allergy injections for long term control if above medications do not help the symptoms - handout given.    Allergic conjunctivitis of both eyes See assessment and plan as above.   Frequent episodes of  sinusitis 3-4 courses of antibiotics this year.  Patient does have environmental allergies which can predispose her to getting sinus infections. Keep track of infections and antibiotics use. If no improvement, will get bloodwork to look at immune system and refer to ENT to look at sinus anatomy.   Asthma Diagnosed with asthma 10+ years ago. History of blood clots and is on Xarelto. Not sure if Symbicort is helping - she is not sure if she has the 35mcg or 159mcg dose at home. Today's spirometry showed: possible restrictive disease with 6% improvement in FEV1 post bronchodilator treatment. Clinically feeling improved.  Daily controller medication(s): Symbicort 128mcg 2 puffs twice a day with spacer and rinse mouth afterwards. Continue Singulair (montelukast) 10mg  daily at night. Spacer given and demonstrated proper use with inhaler. Patient understood technique and all questions/concerned were addressed.  May use albuterol rescue inhaler 2 puffs every 4 to 6 hours as needed for shortness of breath, chest tightness, coughing, and wheezing. May use albuterol rescue inhaler 2 puffs 5 to 15 minutes prior to strenuous physical activities. Monitor frequency of use.  Get spirometry at next visit.   Return in about 3 months (around 02/07/2022).    Assessment and Plan: [name] is a 51 y.o. female with: No problem-specific Assessment & Plan notes found for this encounter.  No follow-ups on file.  No orders of the defined types were placed in this encounter.  Lab Orders  No laboratory test(s) ordered today    Diagnostics: Spirometry:  Tracings reviewed. Her effort: {Blank single:19197::"Good reproducible efforts.","It was hard to get consistent efforts and there is a question as to whether this reflects a maximal maneuver.","Poor effort, data can not be interpreted."}  FVC: ***L FEV1: ***L, ***% predicted FEV1/FVC ratio: ***% Interpretation: {Blank single:19197::"Spirometry consistent with mild  obstructive disease","Spirometry consistent with moderate obstructive disease","Spirometry consistent with severe obstructive disease","Spirometry consistent with possible restrictive disease","Spirometry consistent with mixed obstructive and restrictive disease","Spirometry uninterpretable due to technique","Spirometry consistent with normal pattern","No overt abnormalities noted given today's efforts"}.  Please see scanned spirometry results for details.  Skin Testing: {Blank single:19197::"Select foods","Environmental allergy panel","Environmental allergy panel and select foods","Food allergy panel","None","Deferred due to recent antihistamines use"}. *** Results discussed with patient/family.   Medication List:  Current Outpatient Medications  Medication Sig Dispense Refill   acetaminophen (TYLENOL) 500 MG tablet Take 1,000 mg by mouth every 6 (six) hours as needed for moderate pain.      albuterol (PROVENTIL HFA;VENTOLIN HFA) 108 (90 BASE) MCG/ACT inhaler Inhale 2 puffs into the lungs every 6 (six) hours as needed for wheezing or shortness of breath.     ALPRAZolam (XANAX) 0.5 MG tablet alprazolam 0.5 mg tablet  TAKE ONE TABLET BY MOUTH AS NEEDED FOR flight     amitriptyline (ELAVIL) 25 MG tablet 1 tablet at bedtime.     amLODipine (NORVASC) 5 MG tablet Take 5 mg by mouth daily.     atorvastatin (LIPITOR) 10 MG tablet Take 10 mg by mouth at bedtime.     azelastine (ASTELIN) 0.1 % nasal spray Place 1-2 sprays into both nostrils 2 (two) times daily as needed (nasal drainage). Use in each nostril as directed 30 mL 5   budesonide-formoterol (SYMBICORT) 160-4.5 MCG/ACT inhaler Inhale 2 puffs into the lungs in the morning and at bedtime. with spacer and rinse mouth afterwards. 1 each 3   chlorthalidone (HYGROTON) 25 MG tablet TAKE 2 TABLETS(50 MG) BY MOUTH DAILY 180 tablet 0   COMFORT EZ PEN NEEDLES 32G X 4 MM MISC USE AS DIRECTED daily 100 each 2   Continuous Blood Gluc Sensor  (FREESTYLE LIBRE 2 SENSOR) MISC 1 Device by Does not apply route as directed. Change every 14 days 6 each 3   cyclobenzaprine (FLEXERIL) 10 MG tablet cyclobenzaprine 10 mg tablet     EPINEPHrine 0.3 mg/0.3 mL IJ SOAJ injection Inject 0.3 mg into the muscle as needed for anaphylaxis.      EPINEPHrine 0.3 mg/0.3 mL IJ SOAJ injection See admin instructions.     Fluocinolone Acetonide Body 0.01 % OIL fluocinolone 0.01 % scalp oil and shower cap  APPLY SMALL AMOUNT TOPICALLY TO THE AFFECTED AREA 2 TO 3 TIMES WEEKLY AS DIRECTED     Fluocinolone Acetonide Scalp 0.01 % OIL      fluticasone (FLONASE) 50 MCG/ACT nasal spray Place 1 spray into both nostrils daily as needed (for nasal congestion/sinus infection).      glucose blood test strip 1 each by Other route as needed for other. Use as instructed to test blood sugar  3 times daily E11.65 100 each 11   insulin degludec (TRESIBA FLEXTOUCH) 100 UNIT/ML FlexTouch Pen Inject 30 Units into the skin daily. 30 mL 3   insulin lispro (HUMALOG KWIKPEN) 100 UNIT/ML KwikPen 40 units max daily dose 30 mL 3   Insulin Pen Needle 31G X 5 MM MISC 1 Device by Does not apply route 4 (four) times daily. 400 each 3   levocetirizine (XYZAL) 5 MG tablet Take 1 tablet (5 mg total) by mouth every evening. 30 tablet 5   lisinopril (ZESTRIL) 5 MG tablet Take 2.5 mg by mouth daily.     montelukast (SINGULAIR) 10 MG tablet Take 10 mg by mouth  at bedtime. Reported on 02/16/2016     ondansetron (ZOFRAN ODT) 4 MG disintegrating tablet Take 1 tablet (4 mg total) by mouth every 8 (eight) hours as needed for nausea or vomiting. 20 tablet 0   oxyCODONE-acetaminophen (PERCOCET/ROXICET) 5-325 MG tablet Take 1 tablet by mouth every 6 (six) hours as needed for severe pain.     pioglitazone (ACTOS) 30 MG tablet Take 1 tablet (30 mg total) by mouth daily. 90 tablet 3   pregabalin (LYRICA) 100 MG capsule Take 1 capsule (100 mg total) by mouth 3 (three) times daily. (Patient taking  differently: Take 150 mg by mouth daily. Takes 150mg ) 90 capsule 5   rivaroxaban (XARELTO) 20 MG TABS tablet Take 20 mg by mouth at bedtime.     [START ON 02/08/2022] topiramate (TOPAMAX) 25 MG tablet For next pillpack in March, please dispense Topiramate 25mg : take 2 tablets every night 180 tablet 3   triamcinolone (NASACORT) 55 MCG/ACT AERO nasal inhaler 1 spray in each nostril     TRULICITY 3 YJ/8.5UD SOPN INJECT 3mg  into THE SKIN ONCE A WEEK 2 mL 2   No current facility-administered medications for this visit.   Allergies: Allergies  Allergen Reactions   Silver Other (See Comments) and Rash   Canagliflozin Other (See Comments)   Codeine Nausea And Vomiting    Pt takes Percocet without problems    Darvocet [Propoxyphene N-Acetaminophen] Nausea And Vomiting   Glipizide     Diarrhea and abdominal pain    Hydrocodone-Acetaminophen Nausea And Vomiting   Lantus [Insulin Glargine]     Burning at injection site    Liraglutide     Other reaction(s): upset stomach   Metformin Hcl     Other reaction(s): diarrhea   Propoxyphene     Other reaction(s): nausea   Hydrocodone-Acetaminophen Nausea And Vomiting    Vomiting   Latex Rash   Tape Itching and Rash   Tramadol Nausea Only   I reviewed her past medical history, social history, family history, and environmental history and no significant changes have been reported from her previous visit.  Review of Systems  Constitutional:  Negative for appetite change, chills, fever and unexpected weight change.  HENT:  Positive for postnasal drip and rhinorrhea. Negative for congestion.   Eyes:  Negative for itching.  Respiratory:  Negative for cough, chest tightness, shortness of breath and wheezing.   Cardiovascular:  Negative for chest pain.  Gastrointestinal:  Negative for abdominal pain.  Genitourinary:  Negative for difficulty urinating.  Skin:  Negative for rash.  Allergic/Immunologic: Positive for environmental  allergies. Negative for food allergies.  Neurological:  Positive for headaches.   Objective: LMP 11/06/2012  There is no height or weight on file to calculate BMI. Physical Exam Vitals and nursing note reviewed.  Constitutional:      Appearance: Normal appearance. She is well-developed.  HENT:     Head: Normocephalic and atraumatic.     Right Ear: Tympanic membrane and external ear normal.     Left Ear: Tympanic membrane and external ear normal.     Nose: Nose normal.     Mouth/Throat:     Mouth: Mucous membranes are moist.     Pharynx: Oropharynx is clear.  Eyes:     Conjunctiva/sclera: Conjunctivae normal.  Cardiovascular:     Rate and Rhythm: Normal rate and regular rhythm.     Heart sounds: Normal heart sounds. No murmur heard.   No friction rub. No gallop.  Pulmonary:  Effort: Pulmonary effort is normal.     Breath sounds: Normal breath sounds. No wheezing, rhonchi or rales.  Musculoskeletal:     Cervical back: Neck supple.  Skin:    General: Skin is warm.     Findings: No rash.  Neurological:     Mental Status: She is alert and oriented to person, place, and time.  Psychiatric:        Behavior: Behavior normal.  Previous notes and tests were reviewed. The plan was reviewed with the patient/family, and all questions/concerned were addressed.  It was my pleasure to see Makayla Frazier today and participate in her care. Please feel free to contact me with any questions or concerns.  Sincerely,  Rexene Alberts, DO Allergy & Immunology  [facility] office: [place] office: [phone]

## 2022-02-08 ENCOUNTER — Ambulatory Visit: Payer: Medicare Other | Admitting: Allergy

## 2022-02-08 DIAGNOSIS — H1013 Acute atopic conjunctivitis, bilateral: Secondary | ICD-10-CM

## 2022-02-08 DIAGNOSIS — J4540 Moderate persistent asthma, uncomplicated: Secondary | ICD-10-CM

## 2022-02-08 DIAGNOSIS — J301 Allergic rhinitis due to pollen: Secondary | ICD-10-CM

## 2022-02-08 DIAGNOSIS — J329 Chronic sinusitis, unspecified: Secondary | ICD-10-CM

## 2022-02-14 ENCOUNTER — Encounter: Payer: Self-pay | Admitting: *Deleted

## 2022-02-14 NOTE — Progress Notes (Signed)
Received a referral to see this patient. She is the daughter of a current patient, and Makayla Frazier agreed to see her for a newly diagnosed pelvic mass. However, we have now received medical records and pelvic mass is a benign process that has been under surveillance since 2018.  ? ?Since there is no malignant process, Makayla Frazier has no expertise to weigh in on management. He recommends the patient continue management with her GYN or PCP.  ? ?Spoke to the patient and she is aware of the misunderstanding and that the referral will be closed without scheduling.  ? ?Oncology Nurse Navigator Documentation ? ?Oncology Nurse Navigator Flowsheets 02/14/2022  ?Navigator Location [facility]  ?Referral Date to RadOnc/MedOnc 02/13/2022  ?Navigator Encounter Type Telephone  ?Barriers/Navigation Needs Coordination of Care  ?Interventions Education  ?Acuity Level 1-No Barriers  ?Time Spent with Patient 30  ?  ?

## 2022-02-16 ENCOUNTER — Ambulatory Visit (INDEPENDENT_AMBULATORY_CARE_PROVIDER_SITE_OTHER): Payer: Medicare Other | Admitting: Sports Medicine

## 2022-02-16 ENCOUNTER — Other Ambulatory Visit: Payer: Self-pay

## 2022-02-16 ENCOUNTER — Encounter: Payer: Self-pay | Admitting: Sports Medicine

## 2022-02-16 DIAGNOSIS — E1165 Type 2 diabetes mellitus with hyperglycemia: Secondary | ICD-10-CM

## 2022-02-16 DIAGNOSIS — M79674 Pain in right toe(s): Secondary | ICD-10-CM | POA: Diagnosis not present

## 2022-02-16 DIAGNOSIS — B351 Tinea unguium: Secondary | ICD-10-CM

## 2022-02-16 DIAGNOSIS — M79675 Pain in left toe(s): Secondary | ICD-10-CM | POA: Diagnosis not present

## 2022-02-16 DIAGNOSIS — M21619 Bunion of unspecified foot: Secondary | ICD-10-CM

## 2022-02-16 NOTE — Progress Notes (Signed)
Subjective: Makayla Frazier is a 51 y.o. female patient with history of diabetes who presents to office today complaining of long,mildly painful nails  while ambulating in shoes; unable to trim. Patient states last A1c 9.6, FBS 69 Garba, Mohammad L, MD visit with PCP 2 weeks ago.    Patient Active Problem List   Diagnosis Date Noted   Allergic conjunctivitis of both eyes 11/07/2021   Frequent episodes of sinusitis 11/07/2021   Palpitations 10/06/2021   Diabetic neuropathy ([place]) 08/18/2021   History of epilepsy 08/18/2021   Lumbar radiculopathy 07/04/2021   Pelvic mass 07/04/2021   Type 2 diabetes mellitus with hyperglycemia, with long-term current use of insulin ([place]) 06/09/2021   Dyslipidemia 06/09/2021   Acanthosis nigricans 05/12/2021   Allergic rhinitis due to pollen 05/12/2021   Fear of flying 05/12/2021   Hemorrhoids 05/12/2021   History of 2019 novel coronavirus disease (COVID-19) 05/12/2021   Iron deficiency anemia 05/12/2021   Leukocytosis 05/12/2021   Migraine without aura, not refractory 05/12/2021   Other allergy status, other than to drugs and biological substances 05/12/2021   Polyneuropathy due to type 2 diabetes mellitus ([place]) 05/12/2021   Sciatica 05/12/2021   Low back pain 04/26/2021   Mass of psoas muscle 04/26/2021   DM (diabetes mellitus) ([place])    Obesity    Stroke ([place])    Hypertension    Herpes simplex type 2 infection 10/14/2020   Type 2 diabetes mellitus with hyperglycemia, without long-term current use of insulin ([place]) 12/25/2019   Type 2 diabetes mellitus with diabetic neuropathy, with long-term current use of insulin ([place]) 12/25/2019   Transient loss of consciousness 11/09/2019   Weakness of right leg 11/09/2019   Multiple idiopathic pulmonary cysts 11/09/2019   Hypertensive urgency 11/09/2019   Hyperlipidemia associated with type 2 diabetes mellitus ([place]) 06/27/2018   GAD (generalized anxiety disorder) 08/21/2016   Moderate single current episode  of major depressive disorder ([place]) 08/21/2016   Reaction to severe stress 03/07/2016   Stress 03/07/2016   Bleeding per rectum 11/29/2015   Acute pansinusitis 10/20/2015   Candida vaginitis 10/20/2015   Cerebrovascular accident, late effects 08/30/2015   D (diarrhea) 08/02/2015   Loose bowel movement 08/02/2015   Seizure disorder ([place]) 07/13/2015   Adnexal pain 06/08/2015   Right flank pain 05/30/2015   Chronic pulmonary embolism ([place]) 05/14/2015   Long term current use of anticoagulant 05/14/2015   Seizures ([place]) 05/07/2014   Absence of bladder continence 03/27/2014   Arthropathia 02/25/2014   Chronic chest pain 02/25/2014   Healed or old pulmonary embolism 02/25/2014   HLD (hyperlipidemia) 02/25/2014   Headache, migraine 02/25/2014   Obstructive apnea 02/25/2014   Uncontrolled diabetes mellitus with hyperglycemia ([place]) 02/25/2014   Incontinence 02/13/2014   S/P vaginal hysterectomy 01/29/2013   TIA (transient ischemic attack) 08/03/2012   Left-sided weakness 08/03/2012   Hypokalemia 08/03/2012   Chest pain 08/03/2012   Temporary cerebral vascular dysfunction 08/03/2012   History of pulmonary embolism 05/18/2012   Symptomatic mammary hypertrophy 10/12/2011   Patellar tendinitis of left knee 08/21/2011   Knee pain, left 07/07/2011   PRURITUS 07/04/2010   Syncope and collapse 10/18/2009   Other allergic rhinitis 08/11/2009   Unspecified vitamin D deficiency 07/21/2009   Adiposity 07/19/2009   Apnea, sleep 07/19/2009   ANEMIA 07/10/2008   Essential hypertension 07/08/2008   Insulin dependent diabetes mellitus with complications ([place]) 25/85/2778   Peripheral neuropathy 01/22/2008   Asthma 01/22/2008   Mononeuritis 01/22/2008   Current Outpatient Medications on File  Prior to Visit  Medication Sig Dispense Refill   acetaminophen (TYLENOL) 500 MG tablet Take 1,000 mg by mouth every 6 (six) hours as needed for moderate pain.      albuterol (PROVENTIL HFA;VENTOLIN HFA) 108  (90 BASE) MCG/ACT inhaler Inhale 2 puffs into the lungs every 6 (six) hours as needed for wheezing or shortness of breath.     ALPRAZolam (XANAX) 0.5 MG tablet alprazolam 0.5 mg tablet  TAKE ONE TABLET BY MOUTH AS NEEDED FOR flight     amitriptyline (ELAVIL) 25 MG tablet 1 tablet at bedtime.     amLODipine (NORVASC) 5 MG tablet Take 5 mg by mouth daily.     atorvastatin (LIPITOR) 10 MG tablet Take 10 mg by mouth at bedtime.     azelastine (ASTELIN) 0.1 % nasal spray Place 1-2 sprays into both nostrils 2 (two) times daily as needed (nasal drainage). Use in each nostril as directed 30 mL 5   budesonide-formoterol (SYMBICORT) 160-4.5 MCG/ACT inhaler Inhale 2 puffs into the lungs in the morning and at bedtime. with spacer and rinse mouth afterwards. 1 each 3   chlorthalidone (HYGROTON) 25 MG tablet TAKE 2 TABLETS(50 MG) BY MOUTH DAILY 180 tablet 0   COMFORT EZ PEN NEEDLES 32G X 4 MM MISC USE AS DIRECTED daily 100 each 2   Continuous Blood Gluc Sensor (FREESTYLE LIBRE 2 SENSOR) MISC 1 Device by Does not apply route as directed. Change every 14 days 6 each 3   cyclobenzaprine (FLEXERIL) 10 MG tablet cyclobenzaprine 10 mg tablet     EPINEPHrine 0.3 mg/0.3 mL IJ SOAJ injection Inject 0.3 mg into the muscle as needed for anaphylaxis.      EPINEPHrine 0.3 mg/0.3 mL IJ SOAJ injection See admin instructions.     Fluocinolone Acetonide Body 0.01 % OIL fluocinolone 0.01 % scalp oil and shower cap  APPLY SMALL AMOUNT TOPICALLY TO THE AFFECTED AREA 2 TO 3 TIMES WEEKLY AS DIRECTED     Fluocinolone Acetonide Scalp 0.01 % OIL      fluticasone (FLONASE) 50 MCG/ACT nasal spray Place 1 spray into both nostrils daily as needed (for nasal congestion/sinus infection).      glucose blood test strip 1 each by Other route as needed for other. Use as instructed to test blood sugar  3 times daily E11.65 100 each 11   insulin degludec (TRESIBA FLEXTOUCH) 100 UNIT/ML FlexTouch Pen Inject 30 Units into the skin daily. 30 mL 3    insulin lispro (HUMALOG KWIKPEN) 100 UNIT/ML KwikPen 40 units max daily dose 30 mL 3   Insulin Pen Needle 31G X 5 MM MISC 1 Device by Does not apply route 4 (four) times daily. 400 each 3   levocetirizine (XYZAL) 5 MG tablet Take 1 tablet (5 mg total) by mouth every evening. 30 tablet 5   lisinopril (ZESTRIL) 2.5 MG tablet Take 2.5 mg by mouth daily.     lisinopril (ZESTRIL) 5 MG tablet Take 2.5 mg by mouth daily.     montelukast (SINGULAIR) 10 MG tablet Take 10 mg by mouth at bedtime. Reported on 02/16/2016     ondansetron (ZOFRAN ODT) 4 MG disintegrating tablet Take 1 tablet (4 mg total) by mouth every 8 (eight) hours as needed for nausea or vomiting. 20 tablet 0   ondansetron (ZOFRAN) 4 MG tablet Take by mouth.     oxyCODONE (OXY IR/ROXICODONE) 5 MG immediate release tablet Take 5 mg by mouth daily.     oxyCODONE-acetaminophen (PERCOCET/ROXICET) 5-325 MG tablet Take 1  tablet by mouth every 6 (six) hours as needed for severe pain.     pioglitazone (ACTOS) 30 MG tablet Take 1 tablet (30 mg total) by mouth daily. 90 tablet 3   pregabalin (LYRICA) 100 MG capsule Take 1 capsule (100 mg total) by mouth 3 (three) times daily. (Patient taking differently: Take 150 mg by mouth daily. Takes '150mg'$ ) 90 capsule 5   pregabalin (LYRICA) 150 MG capsule Take 150 mg by mouth daily.     rivaroxaban (XARELTO) 20 MG TABS tablet Take 20 mg by mouth at bedtime.     topiramate (TOPAMAX) 25 MG tablet For next pillpack in March, please dispense Topiramate '25mg'$ : take 2 tablets every night 180 tablet 3   traMADol (ULTRAM) 50 MG tablet Take 50 mg by mouth every 6 (six) hours as needed.     triamcinolone (NASACORT) 55 MCG/ACT AERO nasal inhaler 1 spray in each nostril     TRULICITY 3 TO/6.7TI SOPN INJECT '3mg'$  into THE SKIN ONCE A WEEK 2 mL 2   No current facility-administered medications on file prior to visit.   Allergies  Allergen Reactions   Silver Other (See Comments) and Rash   Canagliflozin Other (See Comments)    Codeine Nausea And Vomiting    Pt takes Percocet without problems    Darvocet [Propoxyphene N-Acetaminophen] Nausea And Vomiting   Glipizide     Diarrhea and abdominal pain    Hydrocodone     Other reaction(s): N/V   Hydrocodone-Acetaminophen Nausea And Vomiting   Lantus [Insulin Glargine]     Burning at injection site    Liraglutide     Other reaction(s): upset stomach   Metformin Hcl     Other reaction(s): diarrhea   Propoxyphene     Other reaction(s): nausea   Hydrocodone-Acetaminophen Nausea And Vomiting    Vomiting   Latex Rash   Tape Itching and Rash   Tramadol Nausea Only    Recent Results (from the past 2160 hour(s))  POCT glycosylated hemoglobin (Hb A1C)     Status: Abnormal   Collection Time: 12/16/21  9:53 AM  Result Value Ref Range   Hemoglobin A1C 9.6 (A) 4.0 - 5.6 %   HbA1c POC (<> result, manual entry)     HbA1c, POC (prediabetic range)     HbA1c, POC (controlled diabetic range)    Cervicovaginal ancillary only     Status: None   Collection Time: 12/21/21  9:59 AM  Result Value Ref Range   Neisseria Gonorrhea Negative    Chlamydia Negative    Trichomonas Negative    Bacterial Vaginitis (gardnerella) Negative    Candida Vaginitis Negative    Candida Glabrata Negative    Comment Normal Reference Range Candida Species - Negative    Comment Normal Reference Range Candida Galbrata - Negative    Comment Normal Reference Range Trichomonas - Negative    Comment      Normal Reference Range Bacterial Vaginosis - Negative   Comment Normal Reference [place] Chlamydia - Negative    Comment      Normal Reference Range Neisseria Gonorrhea - Negative  POCT urinalysis dipstick     Status: Abnormal   Collection Time: 12/21/21 10:05 AM  Result Value Ref Range   Color, UA yellow yellow   Clarity, UA clear clear   Glucose, UA =500 (A) negative mg/dL   Bilirubin, UA negative negative   Ketones, POC UA negative negative mg/dL   Spec Grav, UA 1.015 1.010 - 1.025    Blood,  UA negative negative   pH, UA 6.0 5.0 - 8.0   Protein Ur, POC negative negative mg/dL   Urobilinogen, UA 0.2 0.2 or 1.0 E.U./dL   Nitrite, UA Negative Negative   Leukocytes, UA Negative Negative  POCT urine pregnancy     Status: None   Collection Time: 12/21/21 10:05 AM  Result Value Ref Range   Preg Test, Ur Negative Negative    Objective: General: Patient is awake, alert, and oriented x 3 and in no acute distress.  Integument: Skin is warm, dry and supple bilateral. Nails are tender, long, thickened and dystrophic with subungual debris, consistent with onychomycosis, 1-5 bilateral.  Right hallux nail is partially splitting and the distal end of the nail is loose, no signs of infection. No open lesions or preulcerative lesions present bilateral. Remaining integument unremarkable.  Vasculature:  Dorsalis Pedis pulse 2/4 bilateral. Posterior Tibial pulse  2/4 bilateral.  Capillary fill time <3 sec 1-5 bilateral. Positive hair growth to the level of the digits. Temperature gradient within normal limits. No varicosities present bilateral. No edema present bilateral.   Neurology: Intact, unchanged from prior, subjective occassional tingling consistent with neuropathy history.   Musculoskeletal: Asymptomatic bunion pedal deformities noted left greater than right.  Muscular strength 5/5 in all lower extremity muscular groups bilateral without pain on range of motion . No tenderness with calf compression bilateral.  Assessment and Plan: Problem List Items Addressed This Visit       Endocrine   Type 2 diabetes mellitus with hyperglycemia, without long-term current use of insulin (HCC)   Relevant Medications   lisinopril (ZESTRIL) 2.5 MG tablet   Other Visit Diagnoses     Pain due to onychomycosis of toenails of both feet    -  Primary   Bunion           -Examined patient. -Re-Discussed and educated patient again on diabetic foot care and the importance of glycemic  control. -Mechanically debrided all nails 1-5 bilateral using sterile nail nipper and filed with dremel without incident  -Advised patient to file her toenail if it grows out and starts to lift her splint again on the right hallux I also advised patient to take hair skin and nail vitamin to help her nails grow back stronger since she had a previous injury to this nail last year however at this time the nail appears to be growing out very well free of infection at this time -Answered all patient questions -Patient to return  in 3 months for at risk foot care -Patient advised to call the office if any problems or questions arise in the meantime.  Landis Martins, DPM

## 2022-02-19 NOTE — Progress Notes (Signed)
Follow Up Note  RE: Makayla Frazier MRN: [id number] DOB: [birth year] Date of Office Visit: 02/20/2022  Referring provider: Elwyn Reach, MD Primary care provider: Elwyn Reach, MD  Chief Complaint: Follow-up  History of Present Illness: I had the pleasure of seeing Makayla Frazier for a follow up visit at the [facility] on 02/20/2022. She is a 51 y.o. female, who is being followed for allergic rhinoconjunctivitis, asthma, frequent sinusitis. Her previous allergy office visit was on 11/07/2021 with Dr. Maudie Frazier. Today is a regular follow up visit.  Allergic rhinoconjunctivitis Having PND which is causing nausea. Taking allegra and Singulair and azelastine 2 sprays per nostril BID with no benefit. Does not like the taste of azelastine. Nasacort not effective. Using pataday eye drops only as needed.  Not sure about allergy shots yet. Declines saline rinses.     Frequent episodes of sinusitis No infections/antibiotics since the last visit.    Asthma Currently on Symbicort 178mg 2 puffs twice a day with spacer and rinse mouth afterwards. No albuterol use.  Denies any ER/urgent care visits or prednisone use since the last visit.  Assessment and Plan: [name] a 51y.o. female with: Seasonal allergic rhinitis due to pollen Past history - Rhino conjunctivitis symptoms mainly in the summer for the past 5-6 years. Tried zyrtec, Claritin, Singulair, Nasacort, Atrovent with minimal benefit. 3-4 courses of antibiotics. Skin testing int he past was positive to grass per patient report. No prior AIT. No prior ENT evaluation. 2022 skin testing showed: positive to grass, ragweed, weed, trees.  Interim history - azelastine, Nasacort ineffective; PND causing nausea.  Continue environmental control measures as below. Use over the counter antihistamines such as Allegra (fexofenadine), or Xyzal (levocetirizine) daily as needed. May take twice a day during allergy flares. May  switch antihistamines every few months. Continue Singulair (montelukast) '10mg'$  daily at night. Start Ryaltris (olopatadine + mometasone nasal spray combination) 1-2 sprays per nostril twice a day. Sample given. This replaces all your other nasal sprays. Nasal saline spray (i.e., Simply Saline) or nasal saline lavage (i.e., NeilMed) is recommended as needed and prior to medicated nasal sprays. Use olopatadine eye drops 0.2% once a day as needed for itchy/watery eyes. Consider allergy injections for long term control if above medications do not help the symptoms - handout given.   Allergic conjunctivitis of both eyes See assessment and plan as above.  Recurrent infections Past history - 3-4 courses of antibiotics in 2022.  Interim history - no infections/antibiotics since last visit.  Keep track of infections and antibiotics use.  Asthma Past history - Diagnosed with asthma 10+ years ago. History of blood clots and is on Xarelto. 2022 spirometry showed: possible restrictive disease with 6% improvement in FEV1 post bronchodilator treatment. Clinically feeling improved.  Interim history - stable.  Today's spirometry showed some restriction. Daily controller medication(s): Symbicort 1633m 2 puffs twice a day with spacer and rinse mouth afterwards. May use albuterol rescue inhaler 2 puffs every 4 to 6 hours as needed for shortness of breath, chest tightness, coughing, and wheezing. May use albuterol rescue inhaler 2 puffs 5 to 15 minutes prior to strenuous physical activities. Monitor frequency of use.  Get spirometry at next visit.  Return in about 3 months (around 05/23/2022).  Meds ordered this encounter  Medications   Olopatadine-Mometasone (RYALTRIS) 665-25 MCG/ACT SUSP    Sig: Place 1-2 sprays into the nose in the morning and at bedtime.    Dispense:  29  g    Refill:  5    [phone]   Lab Orders  No laboratory test(s) ordered today    Diagnostics: Spirometry:  Tracings  reviewed. Her effort: Good reproducible efforts. FVC: 1.70L FEV1: 1.35L, 65% predicted FEV1/FVC ratio: 79% Interpretation: Spirometry consistent with possible restrictive disease.  Please see scanned spirometry results for details.  Medication List:  Current Outpatient Medications  Medication Sig Dispense Refill   acetaminophen (TYLENOL) 500 MG tablet Take 1,000 mg by mouth every 6 (six) hours as needed for moderate pain.      albuterol (PROVENTIL HFA;VENTOLIN HFA) 108 (90 BASE) MCG/ACT inhaler Inhale 2 puffs into the lungs every 6 (six) hours as needed for wheezing or shortness of breath.     ALPRAZolam (XANAX) 0.5 MG tablet alprazolam 0.5 mg tablet  TAKE ONE TABLET BY MOUTH AS NEEDED FOR flight     amitriptyline (ELAVIL) 25 MG tablet 1 tablet at bedtime.     amLODipine (NORVASC) 5 MG tablet Take 5 mg by mouth daily.     atorvastatin (LIPITOR) 10 MG tablet Take 10 mg by mouth at bedtime.     budesonide-formoterol (SYMBICORT) 160-4.5 MCG/ACT inhaler Inhale 2 puffs into the lungs in the morning and at bedtime. with spacer and rinse mouth afterwards. 1 each 3   chlorthalidone (HYGROTON) 25 MG tablet TAKE 2 TABLETS(50 MG) BY MOUTH DAILY 180 tablet 0   COMFORT EZ PEN NEEDLES 32G X 4 MM MISC USE AS DIRECTED daily 100 each 2   Continuous Blood Gluc Sensor (FREESTYLE LIBRE 2 SENSOR) MISC 1 Device by Does not apply route as directed. Change every 14 days 6 each 3   cyclobenzaprine (FLEXERIL) 10 MG tablet cyclobenzaprine 10 mg tablet     EPINEPHrine 0.3 mg/0.3 mL IJ SOAJ injection Inject 0.3 mg into the muscle as needed for anaphylaxis.      EPINEPHrine 0.3 mg/0.3 mL IJ SOAJ injection See admin instructions.     Fluocinolone Acetonide Body 0.01 % OIL fluocinolone 0.01 % scalp oil and shower cap  APPLY SMALL AMOUNT TOPICALLY TO THE AFFECTED AREA 2 TO 3 TIMES WEEKLY AS DIRECTED     Fluocinolone Acetonide Scalp 0.01 % OIL      glucose blood test strip 1 each by Other route as needed for other. Use as  instructed to test blood sugar  3 times daily E11.65 100 each 11   insulin degludec (TRESIBA FLEXTOUCH) 100 UNIT/ML FlexTouch Pen Inject 30 Units into the skin daily. 30 mL 3   insulin lispro (HUMALOG KWIKPEN) 100 UNIT/ML KwikPen 40 units max daily dose 30 mL 3   Insulin Pen Needle 31G X 5 MM MISC 1 Device by Does not apply route 4 (four) times daily. 400 each 3   levocetirizine (XYZAL) 5 MG tablet Take 1 tablet (5 mg total) by mouth every evening. 30 tablet 5   lisinopril (ZESTRIL) 2.5 MG tablet Take 2.5 mg by mouth daily.     lisinopril (ZESTRIL) 5 MG tablet Take 2.5 mg by mouth daily.     montelukast (SINGULAIR) 10 MG tablet Take 10 mg by mouth at bedtime. Reported on 02/16/2016     Olopatadine-Mometasone (RYALTRIS) 665-25 MCG/ACT SUSP Place 1-2 sprays into the nose in the morning and at bedtime. 29 g 5   ondansetron (ZOFRAN ODT) 4 MG disintegrating tablet Take 1 tablet (4 mg total) by mouth every 8 (eight) hours as needed for nausea or vomiting. 20 tablet 0   ondansetron (ZOFRAN) 4 MG tablet Take by mouth.  oxyCODONE (OXY IR/ROXICODONE) 5 MG immediate release tablet Take 5 mg by mouth daily.     oxyCODONE-acetaminophen (PERCOCET/ROXICET) 5-325 MG tablet Take 1 tablet by mouth every 6 (six) hours as needed for severe pain.     pioglitazone (ACTOS) 30 MG tablet Take 1 tablet (30 mg total) by mouth daily. 90 tablet 3   pregabalin (LYRICA) 100 MG capsule Take 1 capsule (100 mg total) by mouth 3 (three) times daily. (Patient taking differently: Take 150 mg by mouth daily. Takes '150mg'$ ) 90 capsule 5   pregabalin (LYRICA) 150 MG capsule Take 150 mg by mouth daily.     rivaroxaban (XARELTO) 20 MG TABS tablet Take 20 mg by mouth at bedtime.     topiramate (TOPAMAX) 25 MG tablet For next pillpack in March, please dispense Topiramate '25mg'$ : take 2 tablets every night 989 tablet 3   TRULICITY 3 QJ/1.9ER SOPN INJECT '3mg'$  into THE SKIN ONCE A WEEK 2 mL 2   No current facility-administered medications for  this visit.   Allergies: Allergies  Allergen Reactions   Silver Other (See Comments) and Rash   Canagliflozin Other (See Comments)   Codeine Nausea And Vomiting    Pt takes Percocet without problems    Darvocet [Propoxyphene N-Acetaminophen] Nausea And Vomiting   Glipizide     Diarrhea and abdominal pain    Hydrocodone     Other reaction(s): N/V   Hydrocodone-Acetaminophen Nausea And Vomiting   Lantus [Insulin Glargine]     Burning at injection site    Liraglutide     Other reaction(s): upset stomach   Metformin Hcl     Other reaction(s): diarrhea   Propoxyphene     Other reaction(s): nausea   Hydrocodone-Acetaminophen Nausea And Vomiting    Vomiting   Latex Rash   Tape Itching and Rash   Tramadol Nausea Only   I reviewed her past medical history, social history, family history, and environmental history and no significant changes have been reported from her previous visit.  Review of Systems  Constitutional:  Negative for appetite change, chills, fever and unexpected weight change.  HENT:  Positive for postnasal drip and rhinorrhea. Negative for congestion.   Eyes:  Negative for itching.  Respiratory:  Negative for cough, chest tightness, shortness of breath and wheezing.   Cardiovascular:  Negative for chest pain.  Gastrointestinal:  Positive for nausea. Negative for abdominal pain.  Genitourinary:  Negative for difficulty urinating.  Skin:  Negative for rash.  Allergic/Immunologic: Positive for environmental allergies. Negative for food allergies.   Objective: BP 130/90 (BP Location: Left Arm, Patient Position: Sitting, Cuff Size: Normal)    Pulse 98    Temp 97.6 F (36.4 C) (Temporal)    Resp 16    LMP 11/06/2012    SpO2 97%  There is no height or weight on file to calculate BMI. Physical Exam Vitals and nursing note reviewed.  Constitutional:      Appearance: Normal appearance. She is well-developed.  HENT:     Head: Normocephalic and atraumatic.     Right  Ear: Tympanic membrane and external ear normal.     Left Ear: Tympanic membrane and external ear normal.     Nose: Congestion (on left side) present.     Mouth/Throat:     Mouth: Mucous membranes are moist.     Pharynx: Oropharynx is clear.  Eyes:     Conjunctiva/sclera: Conjunctivae normal.  Cardiovascular:     Rate and Rhythm: Normal rate and regular rhythm.  Heart sounds: Normal heart sounds. No murmur heard.   No friction rub. No gallop.  Pulmonary:     Effort: Pulmonary effort is normal.     Breath sounds: Normal breath sounds. No wheezing, rhonchi or rales.  Musculoskeletal:     Cervical back: Neck supple.  Skin:    General: Skin is warm.     Findings: No rash.  Neurological:     Mental Status: She is alert and oriented to person, place, and time.  Psychiatric:        Behavior: Behavior normal.  Previous notes and tests were reviewed. The plan was reviewed with the patient/family, and all questions/concerned were addressed.  It was my pleasure to see Jeronda today and participate in her care. Please feel free to contact me with any questions or concerns.  Sincerely,  Rexene Alberts, DO Allergy & Immunology  [facility] office: [place] office: [phone]

## 2022-02-20 ENCOUNTER — Other Ambulatory Visit: Payer: Self-pay

## 2022-02-20 ENCOUNTER — Ambulatory Visit (INDEPENDENT_AMBULATORY_CARE_PROVIDER_SITE_OTHER): Payer: Medicare Other | Admitting: Allergy

## 2022-02-20 ENCOUNTER — Encounter: Payer: Self-pay | Admitting: Allergy

## 2022-02-20 VITALS — BP 130/90 | HR 98 | Temp 97.6°F | Resp 16

## 2022-02-20 DIAGNOSIS — H1013 Acute atopic conjunctivitis, bilateral: Secondary | ICD-10-CM

## 2022-02-20 DIAGNOSIS — J301 Allergic rhinitis due to pollen: Secondary | ICD-10-CM

## 2022-02-20 DIAGNOSIS — J4540 Moderate persistent asthma, uncomplicated: Secondary | ICD-10-CM | POA: Diagnosis not present

## 2022-02-20 DIAGNOSIS — B999 Unspecified infectious disease: Secondary | ICD-10-CM | POA: Insufficient documentation

## 2022-02-20 MED ORDER — RYALTRIS 665-25 MCG/ACT NA SUSP: 1.0000 | Freq: Two times a day (BID) | NASAL | 5 refills | Status: DC

## 2022-02-20 NOTE — Assessment & Plan Note (Signed)
Past history - Diagnosed with asthma 10+ years ago. History of blood clots and is on Xarelto. 2022 spirometry showed: possible restrictive disease with 6% improvement in FEV1 post bronchodilator treatment. Clinically feeling improved.  ?Interim history - stable.  ?? Today's spirometry showed some restriction. ?? Daily controller medication(s): Symbicort 121mg 2 puffs twice a day with spacer and rinse mouth afterwards. ?? May use albuterol rescue inhaler 2 puffs every 4 to 6 hours as needed for shortness of breath, chest tightness, coughing, and wheezing. May use albuterol rescue inhaler 2 puffs 5 to 15 minutes prior to strenuous physical activities. Monitor frequency of use.  ?? Get spirometry at next visit. ?

## 2022-02-20 NOTE — Patient Instructions (Addendum)
Environmental allergies ?2022 skin testing showed: positive to grass, ragweed, weed, trees.  ?Continue environmental control measures as below. ?Use over the counter antihistamines such as Allegra (fexofenadine), or Xyzal (levocetirizine) daily as needed. May take twice a day during allergy flares. May switch antihistamines every few months. ?Continue Singulair (montelukast) '10mg'$  daily at night. ? ?Start Ryaltris (olopatadine + mometasone nasal spray combination) 1-2 sprays per nostril twice a day. Sample given. ?This replaces all your other nasal sprays. ?This will be mailed to you.  ?Nasal saline spray (i.e., Simply Saline) or nasal saline lavage (i.e., NeilMed) is recommended as needed and prior to medicated nasal sprays. ?Use olopatadine eye drops 0.2% once a day as needed for itchy/watery eyes. ?Consider allergy injections for long term control if above medications do not help the symptoms - handout given.  ? ?Asthma: ?Daily controller medication(s): Symbicort 126mg 2 puffs twice a day with spacer and rinse mouth afterwards. ?May use albuterol rescue inhaler 2 puffs every 4 to 6 hours as needed for shortness of breath, chest tightness, coughing, and wheezing. May use albuterol rescue inhaler 2 puffs 5 to 15 minutes prior to strenuous physical activities. Monitor frequency of use.  ?Asthma control goals:  ?Full participation in all desired activities (may need albuterol before activity) ?Albuterol use two times or less a week on average (not counting use with activity) ?Cough interfering with sleep two times or less a month ?Oral steroids no more than once a year ?No hospitalizations  ? ?Follow up in 3 months or sooner if needed.   ? ?Reducing Pollen Exposure ?Pollen seasons: trees (spring), grass (summer) and ragweed/weeds (fall). ?Keep windows closed in your home and car to lower pollen exposure.  ?Install air conditioning in the bedroom and throughout the house if possible.  ?Avoid going out in dry windy  days - especially early morning. ?Pollen counts are highest between 5 - 10 AM and on dry, hot and windy days.  ?Save outside activities for late afternoon or after a heavy rain, when pollen levels are lower.  ?Avoid mowing of grass if you have grass pollen allergy. ?Be aware that pollen can also be transported indoors on people and pets.  ?Dry your clothes in an automatic dryer rather than hanging them outside where they might collect pollen.  ?Rinse hair and eyes before bedtime. ? ?

## 2022-02-20 NOTE — Assessment & Plan Note (Signed)
Past history - 3-4 courses of antibiotics in 2022.  ?Interim history - no infections/antibiotics since last visit.  ?? Keep track of infections and antibiotics use. ?

## 2022-02-20 NOTE — Assessment & Plan Note (Signed)
.   See assessment and plan as above. 

## 2022-02-20 NOTE — Assessment & Plan Note (Signed)
Past history - Rhino conjunctivitis symptoms mainly in the summer for the past 5-6 years. Tried zyrtec, Claritin, Singulair, Nasacort, Atrovent with minimal benefit. 3-4 courses of antibiotics. Skin testing int he past was positive to grass per patient report. No prior AIT. No prior ENT evaluation. 2022 skin testing showed: positive to grass, ragweed, weed, trees.  ?Interim history - azelastine, Nasacort ineffective; PND causing nausea.  ?? Continue environmental control measures as below. ?? Use over the counter antihistamines such as Allegra (fexofenadine), or Xyzal (levocetirizine) daily as needed. May take twice a day during allergy flares. May switch antihistamines every few months. ?? Continue Singulair (montelukast) '10mg'$  daily at night. ?? Start Ryaltris (olopatadine + mometasone nasal spray combination) 1-2 sprays per nostril twice a day. Sample given. ?? This replaces all your other nasal sprays. ?? Nasal saline spray (i.e., Simply Saline) or nasal saline lavage (i.e., NeilMed) is recommended as needed and prior to medicated nasal sprays. ?? Use olopatadine eye drops 0.2% once a day as needed for itchy/watery eyes. ?? Consider allergy injections for long term control if above medications do not help the symptoms - handout given.  ?

## 2022-02-26 DIAGNOSIS — I872 Venous insufficiency (chronic) (peripheral): Secondary | ICD-10-CM | POA: Insufficient documentation

## 2022-02-26 DIAGNOSIS — I82409 Acute embolism and thrombosis of unspecified deep veins of unspecified lower extremity: Secondary | ICD-10-CM | POA: Insufficient documentation

## 2022-02-26 DIAGNOSIS — A6000 Herpesviral infection of urogenital system, unspecified: Secondary | ICD-10-CM | POA: Insufficient documentation

## 2022-03-06 ENCOUNTER — Encounter: Payer: Medicare Other | Admitting: Family Medicine

## 2022-03-15 ENCOUNTER — Encounter: Payer: Self-pay | Admitting: Internal Medicine

## 2022-03-15 ENCOUNTER — Other Ambulatory Visit: Payer: Self-pay | Admitting: *Deleted

## 2022-03-15 DIAGNOSIS — E1165 Type 2 diabetes mellitus with hyperglycemia: Secondary | ICD-10-CM

## 2022-03-15 MED ORDER — SYMBICORT 160-4.5 MCG/ACT IN AERO: 2.0000 | INHALATION_SPRAY | Freq: Two times a day (BID) | RESPIRATORY_TRACT | 5 refills | Status: DC

## 2022-03-20 ENCOUNTER — Encounter: Payer: Self-pay | Admitting: Dietician

## 2022-03-20 ENCOUNTER — Encounter: Payer: Medicare Other | Attending: Internal Medicine | Admitting: Dietician

## 2022-03-20 DIAGNOSIS — Z794 Long term (current) use of insulin: Secondary | ICD-10-CM | POA: Insufficient documentation

## 2022-03-20 DIAGNOSIS — E1140 Type 2 diabetes mellitus with diabetic neuropathy, unspecified: Secondary | ICD-10-CM | POA: Insufficient documentation

## 2022-03-20 NOTE — Progress Notes (Signed)
Dexcom G6 Personal CGM Training ? Start [id number]    End time: 1030 ?Total time: 45 ?Makayla Frazier was educated about the following: ? ?-Getting to know device    (Phone programmed ) ?Dexcom G6 Clarity app also added. ?-Setting up device (high alert  270  , low alert 70  ) ?-Inserting sensor ( left upper abdomen       WNL) ?-Calibrating- none required for G6 ?-Ending sensor session ?-Trouble shooting ?-Tape guide, clarity information  ?-Reviewed insulin dosing from dexcom.  ? ?Patient entered into the Clarity app for the [facility] system and is now sharing. ? ?Patient has [place] support and my contact information. ? ?Also, patient states that she does not take her Tyler Aas consistently as she is fearful of the pain.  She has not yet started the Humalog and is going to start since she now has the Dexcom.   ?Reviewed insulin timing and importance of consistency. ?Discussed blood glucose goals and blood glucose rise after a meal. ?Showed patient the CeQur Simplicity insulin patch and states that she was told about another product to help with insulin dosing by another endocrinologist.  She is to see Cloyd Stagers, MD in one month and plans on bringing this to her appointment to discuss.  Patient likes the idea of the patches/pumps to help her take her insulin more consistently. ? ?Antonieta Iba, RD, LDN, CDCES ? ? ? ?

## 2022-03-30 ENCOUNTER — Telehealth: Payer: Self-pay | Admitting: *Deleted

## 2022-03-30 NOTE — Telephone Encounter (Signed)
? ?  Pre-operative Risk Assessment  ?  ?Patient Name: Makayla Frazier  ?DOB: 06/25/1971 ?MRN: [id number]  ? ?  ? ?Request for Surgical Clearance   ? ?Procedure:   COLONOSCOPY ; RECTAL BLEEDING ? ?Date of Surgery:  Clearance 05/17/22                              ?   ?Surgeon:  DR. Alessandra Bevels ?Surgeon's Group or Practice Name:  [facility] ?Phone number:  [phone] ?Fax number:  [phone] ?  ?Type of Clearance Requested:   ?- Medical  ?- Pharmacy:  Hold Rivaroxaban (Xarelto) x 2-3 DAYS ?  ?Type of Anesthesia:   PROPOFOL ?  ?Additional requests/questions:   ? ?Signed, ?Julaine Hua   ?03/30/2022, 10:39 AM  ? ?

## 2022-03-30 NOTE — Telephone Encounter (Signed)
Per chart review at last OV 09/2021, Dr. Marlou Porch had received request from PCP asking him to manage anticoagulation. Will route to pharm then recommend tele visit. ? ?

## 2022-03-31 ENCOUNTER — Telehealth: Payer: Self-pay | Admitting: *Deleted

## 2022-03-31 NOTE — Telephone Encounter (Signed)
Tele pre op appt 04/14/22 @ 11 am. Med rec and consent are done.  ?

## 2022-03-31 NOTE — Telephone Encounter (Signed)
Tele pre op appt 04/14/22 @ 11 am. Med rec and consent are done.  ? ?  ?Patient Consent for Virtual Visit  ? ? ?   ? ?EARLYN Frazier has provided verbal consent on 03/31/2022 for a virtual visit (video or telephone). ? ? ?CONSENT FOR VIRTUAL VISIT FOR:  Makayla Frazier  ?By participating in this virtual visit I agree to the following: ? ?I hereby voluntarily request, consent and authorize [facility] and its employed or contracted physicians, physician assistants, nurse practitioners or other licensed health care professionals (the Practitioner), to provide me with telemedicine health care services (the ?Services") as deemed necessary by the treating Practitioner. I acknowledge and consent to receive the Services by the Practitioner via telemedicine. I understand that the telemedicine visit will involve communicating with the Practitioner through live audiovisual communication technology and the disclosure of certain medical information by electronic transmission. I acknowledge that I have been given the opportunity to request an in-person assessment or other available alternative prior to the telemedicine visit and am voluntarily participating in the telemedicine visit. ? ?I understand that I have the right to withhold or withdraw my consent to the use of telemedicine in the course of my care at any time, without affecting my right to future care or treatment, and that the Practitioner or I may terminate the telemedicine visit at any time. I understand that I have the right to inspect all information obtained and/or recorded in the course of the telemedicine visit and may receive copies of available information for a reasonable fee.  I understand that some of the potential risks of receiving the Services via telemedicine include:  ?Delay or interruption in medical evaluation due to technological equipment failure or disruption; ?Information transmitted may not be sufficient (e.g. poor resolution of images) to  allow for appropriate medical decision making by the Practitioner; and/or  ?In rare instances, security protocols could fail, causing a breach of personal health information. ? ?Furthermore, I acknowledge that it is my responsibility to provide information about my medical history, conditions and care that is complete and accurate to the best of my ability. I acknowledge that Practitioner's advice, recommendations, and/or decision may be based on factors not within their control, such as incomplete or inaccurate data provided by me or distortions of diagnostic images or specimens that may result from electronic transmissions. I understand that the practice of medicine is not an exact science and that Practitioner makes no warranties or guarantees regarding treatment outcomes. I acknowledge that a copy of this consent can be made available to me via my patient portal ([facility]), or I can request a printed copy by calling the office of [facility].   ? ?I understand that my insurance will be billed for this visit.  ? ?I have read or had this consent read to me. ?I understand the contents of this consent, which adequately explains the benefits and risks of the Services being provided via telemedicine.  ?I have been provided ample opportunity to ask questions regarding this consent and the Services and have had my questions answered to my satisfaction. ?I give my informed consent for the services to be provided through the use of telemedicine in my medical care ? ? ? ?

## 2022-03-31 NOTE — Telephone Encounter (Signed)
Pharmacy has provided recommendations on holding Xarelto prior to procedure. ? ?Preoperative team, please contact this patient and set up a phone call appointment for further cardiac evaluation.  Thank you for your help. ? ?Jossie Ng. Arika Mainer NP-C ? ?  ?03/31/2022, 3:11 PM ?[place] ?[address] ?Office [phone] Fax [phone] ? ?

## 2022-03-31 NOTE — Telephone Encounter (Addendum)
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   ? ?Procedure: Colonoscopy ?Date of procedure: 05/17/2022 ? ?CrCl 73 mL/min ?Platelet count 283K ? ?Given patient's elevated VTE risk, recommend she only hold Xarelto for 1 day prior to procedure. She should resume anticoagulation as soon as safely able post procedure due to extensive thrombotic history. ?

## 2022-04-02 ENCOUNTER — Other Ambulatory Visit: Payer: Self-pay | Admitting: Internal Medicine

## 2022-04-14 ENCOUNTER — Ambulatory Visit (INDEPENDENT_AMBULATORY_CARE_PROVIDER_SITE_OTHER): Payer: Medicare Other | Admitting: Student

## 2022-04-14 ENCOUNTER — Encounter: Payer: Self-pay | Admitting: Student

## 2022-04-14 DIAGNOSIS — Z01810 Encounter for preprocedural cardiovascular examination: Secondary | ICD-10-CM | POA: Diagnosis not present

## 2022-04-14 NOTE — Progress Notes (Signed)
? ?Virtual Visit via Telephone Note  ? ?This visit type was conducted due to national recommendations for restrictions regarding the COVID-19 Pandemic (e.g. social distancing) in an effort to limit this patient's exposure and mitigate transmission in our community.  Due to her co-morbid illnesses, this patient is at least at moderate risk for complications without adequate follow up.  This format is felt to be most appropriate for this patient at this time.  The patient did not have access to video technology/had technical difficulties with video requiring transitioning to audio format only (telephone).  All issues noted in this document were discussed and addressed.  No physical exam could be performed with this format.  Please refer to the patient's chart for her  consent to telehealth for [facility]. ?Evaluation Performed:  Preoperative cardiovascular risk assessment ? ?This visit type was conducted due to national recommendations for restrictions regarding the COVID-19 Pandemic (e.g. social distancing).  This format is felt to be most appropriate for this patient at this time.  All issues noted in this document were discussed and addressed.  No physical exam was performed (except for noted visual exam findings with Video Visits).  Please refer to the patient's chart (MyChart message for video visits and phone note for telephone visits) for the patient's consent to telehealth for [facility]. ?_____________  ? ?Date:  04/14/2022  ? ?Patient ID:  Makayla Frazier, DOB [birth year], MRN [id number] ?Patient Location:  ?Home ?Provider location:   ?Office ? ?Primary Care Provider:  Elwyn Reach, MD ?Primary Cardiologist:  Candee Furbish, MD ? ?Chief Complaint  ?  ?Makayla Frazier is a 51 y.o. female with a history of recurrent syncope s/p loop recorder from 2012 to 2014 with no evidence of any arrhythmias, chronic PE on Xarelto, TIA in 2013, hypertension, hyperlipidemia, type 2 diabetes mellitus, sleep apnea, and  seizure diosrder  who is pending a colonoscopy on 05/17/2022 and presents today for telephonic preoperative cardiovascular risk assessment. ? ?Past Medical History  ?  ?Past Medical History:  ?Diagnosis Date  ? Anemia   ? Arthritis   ? Left leg  ? Asthma   ? BV (bacterial vaginosis) 2012  ? Complication of anesthesia   ? Seizures in PACU after surgery 3/15  ? DM (diabetes mellitus) ([place])   ? diagnosed 2009  ? DVT (deep venous thrombosis) ([place]) 2011  ? pt had IUD; also 05/2012  ? Dysfunctional uterine bleeding   ? Seen by Dr. Leo Grosser, GYN; pending hysterectomy as of 07/2012  ? H/O hypercoagulable state   ? 2/2 contraception  ? Headache(784.0)   ? migraines  ? Herpes simplex without mention of complication 02/9766  ? HSV-2  ? HLD (hyperlipidemia)   ? Hypertension   ? Labial lesion 2013  ? Major depression   ? Hx suicide attempt in 2009, [facility] admissions  ? Mastodynia   ? Neuromuscular disorder ([place])   ? neuropathy  ? Neuropathy   ? Obesity   ? Oligomenorrhea 2011  ? PE (pulmonary embolism)   ? 2009 - on oral contraception; multiple  ? Post - coital bleeding 2012  ? Seizures ([place]) 2015  ? last episode May 2015  ? Sleep apnea   ? Status post placement of implantable loop recorder   ? Stroke [facility])   ? TIA 2013  ? Syncopal episodes   ? unknown etiology  ? Thyroid disease   ? hypothyroidism (pt denies)  ? Vaginal Pap smear, abnormal   ? Venous insufficiency   ?  Vitamin D deficiency   ? unknown to pt.  ? ?Past Surgical History:  ?Procedure Laterality Date  ? BILATERAL SALPINGECTOMY  12/18/2012  ? Procedure: BILATERAL SALPINGECTOMY;  Surgeon: Eldred Manges, MD;  Location: [facility];  Service: Gynecology;  Laterality: Bilateral;  ? BLADDER SUSPENSION N/A 02/13/2014  ? Procedure: TRANSVAGINAL TAPE (TVT) PROCEDURE;  Surgeon: Delice Lesch, MD;  Location: [facility];  Service: Gynecology;  Laterality: N/A;  ? BREAST REDUCTION SURGERY    ? Dr. Eugene Garnet [facility] 2011  ? CARDIAC ELECTROPHYSIOLOGY STUDY & DFT    ? Implantable Loop recorder; no  arrhythmias associated with synocpal spells; loop explanted 07-2013  ? CHOLECYSTECTOMY    ? DILATATION & CURRETTAGE/HYSTEROSCOPY WITH RESECTOCOPE  2007 & 2013  ? ENDOMETRIAL BIOPSY  2011  ? IUD REMOVAL  12/18/2012  ? Procedure: INTRAUTERINE DEVICE (IUD) REMOVAL;  Surgeon: Eldred Manges, MD;  Location: [facility];  Service: Gynecology;  Laterality: N/A;  Removed during prep by E. Florene Glen PA  ? LAPAROSCOPIC ASSISTED VAGINAL HYSTERECTOMY  12/18/2012  ? Procedure: LAPAROSCOPIC ASSISTED VAGINAL HYSTERECTOMY;  Surgeon: Eldred Manges, MD;  Location: [facility];  Service: Gynecology;  Laterality: N/A;  ? LOOP RECORDER EXPLANT N/A 07/17/2013  ? Procedure: LOOP RECORDER EXPLANT;  Surgeon: [place] Grayer, MD;  Location[facility] C[facility] LAB;  Service: Cardiovascular;  Laterality: N/A;  ? TRANSVAGINAL TAPE (TVT) REMOVAL N/A 04/28/2014  ? Procedure: Removal of suburethral mesh;  Surgeon: Delice Lesch, MD;  Location: [facility];  Service: Gynecology;  Laterality: N/A;  ? WISDOM TOOTH EXTRACTION    ? ? ?Allergies ? ?Allergies  ?Allergen Reactions  ? Silver Other (See Comments) and Rash  ? Canagliflozin Other (See Comments)  ? Codeine Nausea And Vomiting  ?  Pt takes Percocet without problems ?  ? Darvocet [Propoxyphene N-Acetaminophen] Nausea And Vomiting  ? Glipizide   ?  Diarrhea and abdominal pain   ? Hydrocodone   ?  Other reaction(s): N/V  ? Hydrocodone-Acetaminophen Nausea And Vomiting  ? Lantus [Insulin Glargine]   ?  Burning at injection site   ? Liraglutide   ?  Other reaction(s): upset stomach  ? Metformin Hcl   ?  Other reaction(s): diarrhea  ? Propoxyphene   ?  Other reaction(s): nausea  ? Hydrocodone-Acetaminophen Nausea And Vomiting  ?  Vomiting  ? Latex Rash  ? Tape Itching and Rash  ? Tramadol Nausea Only  ? ? ?History of Present Illness  ?  ?Makayla Frazier is a 51 y.o. female who presents via audio/video conferencing for a telehealth visit today.  Patient was last seen in cardiology clinic on 10/06/2021 by Dr. Marlou Porch.  At that time,  Makayla Frazier was doing well from a cardiac standpoint with no recurrent syncope since being on her seizure medication. She is now pending a colonoscopy in 05/2022. Since her last visit, she has been doing well from a cardiac standpoint. She notes some shortness of breath with activity but this is not new and is stable. No chest pain, orthopnea, or PND. She states she had one episode of low BP recently with associated dizziness but this was an isolated event. BP has improved and dizziness resolved with this. No syncope. Activity is somewhat limited by back pain and knee pain but she is still able to do activities of daily living and chores around the house. She is still able to complete >4.0 METS. ? ?Home Medications  ?  ?Prior to Admission medications   ?Medication Sig Start  Date End Date Taking? Authorizing Provider  ?acetaminophen (TYLENOL) 500 MG tablet Take 1,000 mg by mouth every 6 (six) hours as needed for moderate pain.     [provider]  ?albuterol (PROVENTIL HFA;VENTOLIN HFA) 108 (90 BASE) MCG/ACT inhaler Inhale 2 puffs into the lungs every 6 (six) hours as needed for wheezing or shortness of breath.    [provider]  ?ALPRAZolam Duanne Moron) 0.5 MG tablet alprazolam 0.5 mg tablet ? TAKE ONE TABLET BY MOUTH AS NEEDED FOR flight ?Patient not taking: Reported on 03/31/2022    [provider]  ?amitriptyline (ELAVIL) 25 MG tablet 1 tablet at bedtime.    [provider]  ?amLODipine (NORVASC) 5 MG tablet Take 5 mg by mouth daily.    [provider]  ?atorvastatin (LIPITOR) 10 MG tablet Take 10 mg by mouth at bedtime.    [provider]  ?budesonide-formoterol (SYMBICORT) 160-4.5 MCG/ACT inhaler Inhale 2 puffs into the lungs in the morning and at bedtime. with spacer and rinse mouth afterwards. 11/07/21   Garnet Sierras, DO  ?chlorthalidone (HYGROTON) 25 MG tablet TAKE 2 TABLETS(50 MG) BY MOUTH DAILY 07/21/19   Harvie Heck, MD  ?COMFORT EZ PEN NEEDLES 32G X 4 MM  MISC USE AS DIRECTED daily 02/06/22   Shamleffer, Melanie Crazier, MD  ?Continuous Blood Gluc Sensor (FREESTYLE LIBRE 2 SENSOR) MISC 1 Device by Does not apply route as directed. Change every 14 days

## 2022-04-21 ENCOUNTER — Ambulatory Visit (INDEPENDENT_AMBULATORY_CARE_PROVIDER_SITE_OTHER): Payer: Medicare Other | Admitting: Internal Medicine

## 2022-04-21 ENCOUNTER — Encounter: Payer: Self-pay | Admitting: Internal Medicine

## 2022-04-21 VITALS — BP 100/72 | HR 90 | Ht 60.0 in | Wt 160.4 lb

## 2022-04-21 DIAGNOSIS — E1165 Type 2 diabetes mellitus with hyperglycemia: Secondary | ICD-10-CM | POA: Diagnosis not present

## 2022-04-21 DIAGNOSIS — Z794 Long term (current) use of insulin: Secondary | ICD-10-CM

## 2022-04-21 DIAGNOSIS — E1140 Type 2 diabetes mellitus with diabetic neuropathy, unspecified: Secondary | ICD-10-CM

## 2022-04-21 DIAGNOSIS — R739 Hyperglycemia, unspecified: Secondary | ICD-10-CM

## 2022-04-21 LAB — POCT GLYCOSYLATED HEMOGLOBIN (HGB A1C): Hemoglobin A1C: 9.4 % — AB (ref 4.0–5.6)

## 2022-04-21 MED ORDER — ONETOUCH VERIO VI STRP: 1.0000 | ORAL_STRIP | Freq: Every day | 3 refills | Status: DC

## 2022-04-21 NOTE — Progress Notes (Signed)
? ?Name: Makayla Frazier  ?Age/ Sex: 51 y.o., female   ?MRN/ DOB: [id number], Apr 13, 1971    ? ?PCP: Elwyn Reach, MD   ?Reason for Endocrinology Evaluation: Type 2 Diabetes Mellitus  ?Initial Endocrine Consultative Visit:  12/25/2019  ? ? ?PATIENT IDENTIFIER: Makayla Frazier is a 51 y.o. female with a past medical history of T2DM, HTN and seizure disorder . The patient has followed with Endocrinology clinic since 12/25/2019 for consultative assistance with management of her diabetes. ? ?DIABETIC HISTORY:  ?Makayla Frazier was diagnosed with T2DM 2009.  She was initially on glipizide and Metformin, but developed diarrhea.  She is intolerant to Victoza due to rash.  Invokana because genital infections. Her hemoglobin A1c has ranged from  7.0% in 2011, peaking at 11.3% in 3013. ? ? ?On her initial visit to our clinic she had an A1c of 4.9%, she was on Trulicity only.  Glipizide was added ? ?Glipizide switched to basal insulin in 03/2020 due to worsening hyperglycemia.  ? ? ?Pioglitazone started 05/2021 ?Started novolog 12/2021 ? ?SUBJECTIVE:  ? ?During the last visit (12/16/2021): A1c 9.6%, we increased  Actos and continued Trulicity and insulin  ? ? ?Today (04/21/2022): Makayla Frazier is here for a follow up on diabetes management.  She checked her blood sugars 0 times a day. The patient is not known to have hypoglycemic episodes since the last clinic visit. ? ? ? ?Denies nausea, diarrhea  nor vomiting but has chronic constipation and rectal bleed.  ? ?Follows with [facility] , last visit 02/2022 ? ?She was recently evaluated by cardiology for cardiac clearance for colonoscopy scheduled next month  ? ? ?She saw our RD , and it was recommended that she tried the [place]  ?She is on disability  ?She is under the impression that I stopped Tyler Aas because I started her on Humalog ? But she didn't start the humalog nor continued the tresiba  ? ? ?HOME DIABETES REGIMEN:  ?Tresiba 30 units daily - not taking  ?Trulicity 3 mg  weekly (Fridays)  ?Pioglitazone 30 mg daily  ?CF: Humalog  (BG-120/25)  ? ? ?Statin: Yes ?ACE-I/ARB: Yes ? ? ? ? ?CONTINUOUS GLUCOSE MONITORING RECORD INTERPRETATION   ? ?Dates of Recording: 4/29-5/11/2022 ? ?Sensor description:dexcom  ? ?Results statistics: ?  ?CGM use % of time 93  ?Average and SD 218/62  ?Time in range 29  %  ?% Time Above 180 47  ?% Time above 250 23  ?% Time Below target 0  ? ? ? ?Glycemic patterns summary: hyperglycemia during the day and night  ? ?Hyperglycemic episodes  postprandial ? ?Hypoglycemic episodes occurred n/a ? ?Overnight periods:remains elevated  ? ? ? ? ? ?DIABETIC COMPLICATIONS: ?Microvascular complications:  ?Neuropathy ?Denies: CKD , retinopathy ?Last eye exam: Completed 2022 ?  ?Macrovascular complications:  ?TIA ?Denies: CAD, PVD, CVA ? ?HISTORY:  ?Past Medical History:  ?Past Medical History:  ?Diagnosis Date  ? Anemia   ? Arthritis   ? Left leg  ? Asthma   ? BV (bacterial vaginosis) 2012  ? Complication of anesthesia   ? Seizures in PACU after surgery 3/15  ? DM (diabetes mellitus) ([place])   ? diagnosed 2009  ? DVT (deep venous thrombosis) ([place]) 2011  ? pt had IUD; also 05/2012  ? Dysfunctional uterine bleeding   ? Seen by Dr. Leo Grosser, GYN; pending hysterectomy as of 07/2012  ? H/O hypercoagulable state   ? 2/2 contraception  ? Headache(784.0)   ?  migraines  ? Herpes simplex without mention of complication 0/2637  ? HSV-2  ? HLD (hyperlipidemia)   ? Hypertension   ? Labial lesion 2013  ? Major depression   ? Hx suicide attempt in 2009, [facility] admissions  ? Mastodynia   ? Neuromuscular disorder ([place])   ? neuropathy  ? Neuropathy   ? Obesity   ? Oligomenorrhea 2011  ? PE (pulmonary embolism)   ? 2009 - on oral contraception; multiple  ? Post - coital bleeding 2012  ? Seizures ([place]) 2015  ? last episode May 2015  ? Sleep apnea   ? Status post placement of implantable loop recorder   ? Stroke [facility])   ? TIA 2013  ? Syncopal episodes   ? unknown etiology  ? Thyroid disease   ?  hypothyroidism (pt denies)  ? Vaginal Pap smear, abnormal   ? Venous insufficiency   ? Vitamin D deficiency   ? unknown to pt.  ? ?Past Surgical History:  ?Past Surgical History:  ?Procedure Laterality Date  ? BILATERAL SALPINGECTOMY  12/18/2012  ? Procedure: BILATERAL SALPINGECTOMY;  Surgeon: Eldred Manges, MD;  Location: [facility];  Service: Gynecology;  Laterality: Bilateral;  ? BLADDER SUSPENSION N/A 02/13/2014  ? Procedure: TRANSVAGINAL TAPE (TVT) PROCEDURE;  Surgeon: Delice Lesch, MD;  Location: [facility];  Service: Gynecology;  Laterality: N/A;  ? BREAST REDUCTION SURGERY    ? Dr. Eugene Garnet [facility] 2011  ? CARDIAC ELECTROPHYSIOLOGY STUDY & DFT    ? Implantable Loop recorder; no arrhythmias associated with synocpal spells; loop explanted 07-2013  ? CHOLECYSTECTOMY    ? DILATATION & CURRETTAGE/HYSTEROSCOPY WITH RESECTOCOPE  2007 & 2013  ? ENDOMETRIAL BIOPSY  2011  ? IUD REMOVAL  12/18/2012  ? Procedure: INTRAUTERINE DEVICE (IUD) REMOVAL;  Surgeon: Eldred Manges, MD;  Location: [facility];  Service: Gynecology;  Laterality: N/A;  Removed during prep by E. Florene Glen PA  ? LAPAROSCOPIC ASSISTED VAGINAL HYSTERECTOMY  12/18/2012  ? Procedure: LAPAROSCOPIC ASSISTED VAGINAL HYSTERECTOMY;  Surgeon: Eldred Manges, MD;  Location: [facility];  Service: Gynecology;  Laterality: N/A;  ? LOOP RECORDER EXPLANT N/A 07/17/2013  ? Procedure: LOOP RECORDER EXPLANT;  Surgeon: Thompson Grayer, MD;  Location[facility] C[facility];  Service: Cardiovascular;  Laterality: N/A;  ? TRANSVAGINAL TAPE (TVT) REMOVAL N/A 04/28/2014  ? Procedure: Removal of suburethral mesh;  Surgeon: Delice Lesch, MD;  Location: [facility];  Service: Gynecology;  Laterality: N/A;  ? WISDOM TOOTH EXTRACTION    ? ?Social History:  reports that she has never smoked. She has never used smokeless tobacco. She reports that she does not currently use alcohol. She reports that she does not use drugs. ?Family History:  ?Family History  ?Problem Relation Age of Onset  ? Melanoma Father 12  ?  Diabetes Father   ? Hypertension Father   ? Kidney disease Father   ? Ulcerative colitis Mother 22  ? Diabetes Mother   ? Hypertension Mother   ? Breast cancer Paternal Grandmother   ? Cancer Paternal Grandmother   ? Diabetes type II Maternal Grandmother   ?     deceased 42  ? Diabetes Maternal Grandmother   ? Pulmonary embolism Paternal Grandfather   ? Stroke Maternal Grandfather   ? ? ? ?HOME MEDICATIONS: ?Allergies as of 04/21/2022   ? ?   Reactions  ? Silver Other (See Comments), Rash  ? Canagliflozin Other (See Comments)  ? Codeine Nausea And Vomiting  ? Pt takes Percocet without  problems  ? Darvocet [propoxyphene N-acetaminophen] Nausea And Vomiting  ? Glipizide   ? Diarrhea and abdominal pain   ? Hydrocodone   ? Other reaction(s): N/V  ? Hydrocodone-acetaminophen Nausea And Vomiting  ? Lantus [insulin Glargine]   ? Burning at injection site   ? Liraglutide   ? Other reaction(s): upset stomach  ? Metformin Hcl   ? Other reaction(s): diarrhea  ? Propoxyphene   ? Other reaction(s): nausea  ? Hydrocodone-acetaminophen Nausea And Vomiting  ? Vomiting  ? Latex Rash  ? Tape Itching, Rash  ? Tramadol Nausea Only  ? ?  ? ?  ?Medication List  ?  ? ?  ? Accurate as of Apr 21, 2022  9:48 AM. If you have any questions, ask your nurse or doctor.  ?  ?  ? ?  ? ?STOP taking these medications   ? ?Tyler Aas FlexTouch 100 UNIT/ML FlexTouch Pen ?Generic drug: insulin degludec ?Stopped by: Dorita Sciara, MD ?  ? ?  ? ?TAKE these medications   ? ?acetaminophen 500 MG tablet ?Commonly known as: TYLENOL ?Take 1,000 mg by mouth every 6 (six) hours as needed for moderate pain. ?  ?albuterol 108 (90 Base) MCG/ACT inhaler ?Commonly known as: VENTOLIN HFA ?Inhale 2 puffs into the lungs every 6 (six) hours as needed for wheezing or shortness of breath. ?  ?ALPRAZolam 0.5 MG tablet ?Commonly known as: XANAX ?alprazolam 0.5 mg tablet ? TAKE ONE TABLET BY MOUTH AS NEEDED FOR flight ?  ?amitriptyline 25 MG tablet ?Commonly known as:  ELAVIL ?1 tablet at bedtime. ?  ?amLODipine 5 MG tablet ?Commonly known as: NORVASC ?Take 5 mg by mouth daily. ?  ?atorvastatin 10 MG tablet ?Commonly known as: LIPITOR ?Take 10 mg by mouth at bedtime. ?  ?budesonide-fo

## 2022-04-21 NOTE — Patient Instructions (Addendum)
?-   Restart Tresiba 30  units once daily  ?-Continue Trulicity 3 mg weekly  ?-Continue Actos 30 mg daily  ?-Humalog correctional insulin:  Use the scale below to help guide you before each meal  ? ?Blood sugar before meal Number of units to inject  ?Less than 145 0 unit  ?146 -  170 1 units  ?171 -  195 2 units  ?196 -  220 3 units  ?221 -  245 4 units  ?246 -  270 5 units  ?271 -  295 6 units  ?296 -  320 7 units  ?321 -  345 8 units  ?346 - 370 9 units  ?371 - 395 10 units  ?396 - 420 11 units   ? ? ? ? ?-HOW TO TREAT LOW BLOOD SUGARS (Blood sugar LESS THAN 70 MG/DL) ?Please follow the RULE OF 15 for the treatment of hypoglycemia treatment (when your (blood sugars are less than 70 mg/dL)  ? ?STEP 1: Take 15 grams of carbohydrates when your blood sugar is low, which includes:  ?3-4 GLUCOSE TABS  OR ?3-4 OZ OF JUICE OR REGULAR SODA OR ?ONE TUBE OF GLUCOSE GEL   ? ?STEP 2: RECHECK blood sugar in 15 MINUTES ?STEP 3: If your blood sugar is still low at the 15 minute recheck --> then, go back to STEP 1 and treat AGAIN with another 15 grams of carbohydrates. ? ?

## 2022-05-11 ENCOUNTER — Other Ambulatory Visit: Payer: Self-pay

## 2022-05-11 MED ORDER — LEVOCETIRIZINE DIHYDROCHLORIDE 5 MG PO TABS: 5.0000 mg | ORAL_TABLET | Freq: Every evening | ORAL | 1 refills | Status: DC

## 2022-05-11 MED ORDER — RYALTRIS 665-25 MCG/ACT NA SUSP: NASAL | 5 refills | Status: DC

## 2022-05-11 NOTE — Telephone Encounter (Signed)
Sample of rylatris given by Dr. Maudie Mercury at last visit. Patient really liked it so I sent it into the [facility]. Left message on patients voicemail.

## 2022-05-12 MED ORDER — RYALTRIS 665-25 MCG/ACT NA SUSP: 1.0000 | Freq: Two times a day (BID) | NASAL | 5 refills | Status: AC

## 2022-05-18 ENCOUNTER — Encounter: Payer: Self-pay | Admitting: Neurology

## 2022-05-23 ENCOUNTER — Ambulatory Visit (INDEPENDENT_AMBULATORY_CARE_PROVIDER_SITE_OTHER): Payer: Medicare Other | Admitting: Podiatry

## 2022-05-23 ENCOUNTER — Encounter: Payer: Self-pay | Admitting: Podiatry

## 2022-05-23 DIAGNOSIS — B351 Tinea unguium: Secondary | ICD-10-CM | POA: Diagnosis not present

## 2022-05-23 DIAGNOSIS — M79675 Pain in left toe(s): Secondary | ICD-10-CM

## 2022-05-23 DIAGNOSIS — M79674 Pain in right toe(s): Secondary | ICD-10-CM | POA: Diagnosis not present

## 2022-05-23 DIAGNOSIS — E1165 Type 2 diabetes mellitus with hyperglycemia: Secondary | ICD-10-CM | POA: Diagnosis not present

## 2022-05-29 NOTE — Progress Notes (Signed)
  Subjective:  Patient ID: Makayla Frazier, female    DOB: Apr 14, 1971,  MRN: [id number]  Makayla Frazier presents to clinic today for at risk foot care with history of diabetic neuropathy and painful thick toenails that are difficult to trim. Pain interferes with ambulation. Aggravating factors include wearing enclosed shoe gear. Pain is relieved with periodic professional debridement.  Patient states blood glucose was 247 mg/dl today.  Last known HgA1c was 9.4%.  New problem(s): None.   She is a former patient of Dr. Leeanne Rio.  PCP is Makayla Reach, Makayla Frazier , and last visit was last week.  Allergies  Allergen Reactions   Silver Other (See Comments) and Rash   Canagliflozin Other (See Comments)   Codeine Nausea And Vomiting    Pt takes Percocet without problems    Darvocet [Propoxyphene N-Acetaminophen] Nausea And Vomiting   Glipizide     Diarrhea and abdominal pain    Hydrocodone     Other reaction(s): N/V   Hydrocodone-Acetaminophen Nausea And Vomiting   Lantus [Insulin Glargine]     Burning at injection site    Liraglutide     Other reaction(s): upset stomach   Metformin Hcl     Other reaction(s): diarrhea   Propoxyphene     Other reaction(s): nausea   Hydrocodone-Acetaminophen Nausea And Vomiting    Vomiting   Latex Rash   Tape Itching and Rash   Tramadol Nausea Only    Review of Systems: Negative except as noted in the HPI.  Objective: No changes noted in today's physical examination.  Vascular Examination: Vascular status intact b/l with palpable pedal pulses. Pedal hair present b/l. CFT immediate b/l. No varicosities. No pain with calf compression b/l. Skin temperature gradient WNL b/l. No edema noted b/l LE.  Neurological Examination: Sensation grossly intact b/l with 10 gram monofilament. Vibratory sensation intact b/l. Pt has subjective symptoms of neuropathy.  Dermatological Examination: Pedal skin with normal turgor, texture and tone b/l. Split toenail  right hallux, chronic in nature, elongated and mycotic with subungual debris. Toenails left hallux and 2-5 b/l thick, discolored, elongated with subungual debris and pain on dorsal palpation. No hyperkeratotic lesions noted b/l.   Musculoskeletal Examination: Muscle strength 5/5 to b/l LE. Hallux valgus with bunion deformity noted left >right LE.  Radiographs: None  Last A1c:      Latest Ref Rng & Units 04/21/2022    9:52 AM 12/16/2021    9:53 AM 09/09/2021    8:12 AM 06/09/2021    8:13 AM  Hemoglobin A1C  Hemoglobin-A1c 4.0 - 5.6 % 9.4  9.6  11.4  14.5    Assessment/Plan: 1. Pain due to onychomycosis of toenails of both feet   2. Type 2 diabetes mellitus with hyperglycemia, without long-term current use of insulin ([place])      -Patient was evaluated and treated. All patient's and/or POA's questions/concerns answered on today's visit. -Continue foot and shoe inspections daily. Monitor blood glucose per PCP/Endocrinologist's recommendations. -Patient to continue soft, supportive shoe gear daily. -Mycotic toenails 1-5 bilaterally were debrided in length and girth with sterile nail nippers and dremel without iatrogenic bleeding. -Patient/POA to call should there be question/concern in the interim.   Return in about 3 months (around 08/23/2022).  Marzetta Board, DPM

## 2022-06-04 NOTE — Progress Notes (Deleted)
Follow Up Note  RE: Makayla Frazier MRN: [id number] DOB: February 19, 1971 Date of Office Visit: 06/05/2022  Referring provider: Elwyn Reach, MD Primary care provider: Elwyn Reach, MD  Chief Complaint: No chief complaint on file.  History of Present Illness: I had the pleasure of seeing Makayla Frazier for a follow up visit at the [facility] on 06/04/2022. She is a 51 y.o. female, who is being followed for allergic rhinoconjunctivitis, recurrent infections and asthma. Her previous allergy office visit was on 02/20/2022 with Dr. Maudie Mercury. Today is a regular follow up visit.  Seasonal allergic rhinitis due to pollen Past history - Rhino conjunctivitis symptoms mainly in the summer for the past 5-6 years. Tried zyrtec, Claritin, Singulair, Nasacort, Atrovent with minimal benefit. 3-4 courses of antibiotics. Skin testing int he past was positive to grass per patient report. No prior AIT. No prior ENT evaluation. 2022 skin testing showed: positive to grass, ragweed, weed, trees.  Interim history - azelastine, Nasacort ineffective; PND causing nausea.  Continue environmental control measures as below. Use over the counter antihistamines such as Allegra (fexofenadine), or Xyzal (levocetirizine) daily as needed. May take twice a day during allergy flares. May switch antihistamines every few months. Continue Singulair (montelukast) '10mg'$  daily at night. Start Ryaltris (olopatadine + mometasone nasal spray combination) 1-2 sprays per nostril twice a day. Sample given. This replaces all your other nasal sprays. Nasal saline spray (i.e., Simply Saline) or nasal saline lavage (i.e., NeilMed) is recommended as needed and prior to medicated nasal sprays. Use olopatadine eye drops 0.2% once a day as needed for itchy/watery eyes. Consider allergy injections for long term control if above medications do not help the symptoms - handout given.    Allergic conjunctivitis of both eyes See  assessment and plan as above.   Recurrent infections Past history - 3-4 courses of antibiotics in 2022.  Interim history - no infections/antibiotics since last visit.  Keep track of infections and antibiotics use.   Asthma Past history - Diagnosed with asthma 10+ years ago. History of blood clots and is on Xarelto. 2022 spirometry showed: possible restrictive disease with 6% improvement in FEV1 post bronchodilator treatment. Clinically feeling improved.  Interim history - stable.  Today's spirometry showed some restriction. Daily controller medication(s): Symbicort 133mg 2 puffs twice a day with spacer and rinse mouth afterwards. May use albuterol rescue inhaler 2 puffs every 4 to 6 hours as needed for shortness of breath, chest tightness, coughing, and wheezing. May use albuterol rescue inhaler 2 puffs 5 to 15 minutes prior to strenuous physical activities. Monitor frequency of use.  Get spirometry at next visit.   Return in about 3 months (around 05/23/2022).  Assessment and Plan: Makayla Frazier a 51y.o. female with: No problem-specific Assessment & Plan notes found for this encounter.  No follow-ups on file.  No orders of the defined types were placed in this encounter.  Lab Orders  No laboratory test(s) ordered today    Diagnostics: Spirometry:  Tracings reviewed. Her effort: {Blank single:19197::"Good reproducible efforts.","It was hard to get consistent efforts and there is a question as to whether this reflects a maximal maneuver.","Poor effort, data can not be interpreted."} FVC: ***L FEV1: ***L, ***% predicted FEV1/FVC ratio: ***% Interpretation: {Blank single:19197::"Spirometry consistent with mild obstructive disease","Spirometry consistent with moderate obstructive disease","Spirometry consistent with severe obstructive disease","Spirometry consistent with possible restrictive disease","Spirometry consistent with mixed obstructive and restrictive disease","Spirometry  uninterpretable due to technique","Spirometry consistent with normal pattern","No overt abnormalities  noted given today's efforts"}.  Please see scanned spirometry results for details.  Skin Testing: {Blank single:19197::"Select foods","Environmental allergy panel","Environmental allergy panel and select foods","Food allergy panel","None","Deferred due to recent antihistamines use"}. *** Results discussed with patient/family.   Medication List:  Current Outpatient Medications  Medication Sig Dispense Refill  . acetaminophen (TYLENOL) 500 MG tablet Take 1,000 mg by mouth every 6 (six) hours as needed for moderate pain.     Marland Kitchen albuterol (PROVENTIL HFA;VENTOLIN HFA) 108 (90 BASE) MCG/ACT inhaler Inhale 2 puffs into the lungs every 6 (six) hours as needed for wheezing or shortness of breath.    . ALPRAZolam (XANAX) 0.5 MG tablet alprazolam 0.5 mg tablet  TAKE ONE TABLET BY MOUTH AS NEEDED FOR flight (Patient not taking: Reported on 04/21/2022)    . amitriptyline (ELAVIL) 25 MG tablet 1 tablet at bedtime.    Marland Kitchen amLODipine (NORVASC) 5 MG tablet Take 5 mg by mouth daily.    Marland Kitchen atorvastatin (LIPITOR) 10 MG tablet Take 10 mg by mouth at bedtime.    . Azelastine HCl 137 MCG/SPRAY SOLN SMARTSIG:1-2 Spray(s) Both Nares Twice Daily PRN    . budesonide-formoterol (SYMBICORT) 160-4.5 MCG/ACT inhaler Inhale 2 puffs into the lungs in the morning and at bedtime. with spacer and rinse mouth afterwards. 1 each 3  . chlorthalidone (HYGROTON) 25 MG tablet TAKE 2 TABLETS(50 MG) BY MOUTH DAILY 180 tablet 0  . COMFORT EZ PEN NEEDLES 32G X 4 MM MISC USE AS DIRECTED daily 100 each 2  . Continuous Blood Gluc Sensor (FREESTYLE LIBRE 2 SENSOR) MISC 1 Device by Does not apply route as directed. Change every 14 days (Patient not taking: Reported on 04/21/2022) 6 each 3  . cyclobenzaprine (FLEXERIL) 10 MG tablet cyclobenzaprine 10 mg tablet    . EPINEPHrine 0.3 mg/0.3 mL IJ SOAJ injection Inject 0.3 mg into the muscle as needed  for anaphylaxis.     Marland Kitchen EPINEPHrine 0.3 mg/0.3 mL IJ SOAJ injection See admin instructions.    . Fluocinolone Acetonide Body 0.01 % OIL fluocinolone 0.01 % scalp oil and shower cap  APPLY SMALL AMOUNT TOPICALLY TO THE AFFECTED AREA 2 TO 3 TIMES WEEKLY AS DIRECTED    . Fluocinolone Acetonide Scalp 0.01 % OIL     . glucose blood (ONETOUCH VERIO) test strip 1 each by Other route daily in the afternoon. Use as instructed 100 each 3  . insulin lispro (HUMALOG KWIKPEN) 100 UNIT/ML KwikPen 40 units max daily dose 30 mL 3  . Insulin Pen Needle 31G X 5 MM MISC 1 Device by Does not apply route 4 (four) times daily. 400 each 3  . levocetirizine (XYZAL) 5 MG tablet Take 1 tablet (5 mg total) by mouth every evening. 90 tablet 1  . lisinopril (ZESTRIL) 2.5 MG tablet Take 2.5 mg by mouth daily.    Marland Kitchen lisinopril (ZESTRIL) 5 MG tablet Take 2.5 mg by mouth daily. (Patient not taking: Reported on 04/21/2022)    . loratadine (CLARITIN) 10 MG tablet Take 10 mg by mouth daily.    . montelukast (SINGULAIR) 10 MG tablet Take 10 mg by mouth at bedtime. Reported on 02/16/2016    . Olopatadine-Mometasone (RYALTRIS) [id number] MCG/ACT SUSP Place 1-2 sprays into the nose in the morning and at bedtime. 29 g 5  . omeprazole (PRILOSEC) 40 MG capsule Take 40 mg by mouth 2 (two) times daily.    . ondansetron (ZOFRAN ODT) 4 MG disintegrating tablet Take 1 tablet (4 mg total) by mouth every 8 (eight) hours  as needed for nausea or vomiting. 20 tablet 0  . ondansetron (ZOFRAN) 4 MG tablet Take by mouth.    . oxyCODONE (OXY IR/ROXICODONE) 5 MG immediate release tablet Take 5 mg by mouth daily.    Marland Kitchen oxyCODONE-acetaminophen (PERCOCET/ROXICET) 5-325 MG tablet Take 1 tablet by mouth every 6 (six) hours as needed for severe pain. (Patient not taking: Reported on 04/21/2022)    . pioglitazone (ACTOS) 30 MG tablet Take 1 tablet (30 mg total) by mouth daily. 90 tablet 3  . polyethylene glycol-electrolytes (NULYTELY) 420 g solution Take by mouth as  directed.    . pregabalin (LYRICA) 100 MG capsule Take 1 capsule (100 mg total) by mouth 3 (three) times daily. (Patient not taking: Reported on 04/21/2022) 90 capsule 5  . pregabalin (LYRICA) 150 MG capsule Take 150 mg by mouth daily.    . rivaroxaban (XARELTO) 20 MG TABS tablet Take 20 mg by mouth at bedtime.    . SYMBICORT 160-4.5 MCG/ACT inhaler Inhale 2 puffs into the lungs in the morning and at bedtime. 10.2 g 5  . topiramate (TOPAMAX) 25 MG tablet For next pillpack in March, please dispense Topiramate '25mg'$ : take 2 tablets every night 180 tablet 3  . topiramate (TOPAMAX) 25 MG tablet Take by mouth.    . TRULICITY 3 ZD/6.6YQ SOPN INJECT THREE MG into THE SKIN ONCE A WEEK 2 mL 2   No current facility-administered medications for this visit.   Allergies: Allergies  Allergen Reactions  . Silver Other (See Comments) and Rash  . Canagliflozin Other (See Comments)  . Codeine Nausea And Vomiting    Pt takes Percocet without problems   . Darvocet [Propoxyphene N-Acetaminophen] Nausea And Vomiting  . Glipizide     Diarrhea and abdominal pain   . Hydrocodone     Other reaction(s): N/V  . Hydrocodone-Acetaminophen Nausea And Vomiting  . Lantus [Insulin Glargine]     Burning at injection site   . Liraglutide     Other reaction(s): upset stomach  . Metformin Hcl     Other reaction(s): diarrhea  . Propoxyphene     Other reaction(s): nausea  . Hydrocodone-Acetaminophen Nausea And Vomiting    Vomiting  . Latex Rash  . Tape Itching and Rash  . Tramadol Nausea Only   I reviewed her past medical history, social history, family history, and environmental history and no significant changes have been reported from her previous visit.  Review of Systems  Constitutional:  Negative for appetite change, chills, fever and unexpected weight change.  HENT:  Positive for postnasal drip and rhinorrhea. Negative for congestion.   Eyes:  Negative for itching.  Respiratory:  Negative for cough, chest  tightness, shortness of breath and wheezing.   Cardiovascular:  Negative for chest pain.  Gastrointestinal:  Positive for nausea. Negative for abdominal pain.  Genitourinary:  Negative for difficulty urinating.  Skin:  Negative for rash.  Allergic/Immunologic: Positive for environmental allergies. Negative for food allergies.   Objective: LMP 12/19/2011  There is no height or weight on file to calculate BMI. Physical Exam Vitals and nursing note reviewed.  Constitutional:      Appearance: Normal appearance. She is well-developed.  HENT:     Head: Normocephalic and atraumatic.     Right Ear: Tympanic membrane and external ear normal.     Left Ear: Tympanic membrane and external ear normal.     Nose: Congestion (on left side) present.     Mouth/Throat:     Mouth: Mucous membranes  are moist.     Pharynx: Oropharynx is clear.  Eyes:     Conjunctiva/sclera: Conjunctivae normal.  Cardiovascular:     Rate and Rhythm: Normal rate and regular rhythm.     Heart sounds: Normal heart sounds. No murmur heard.    No friction rub. No gallop.  Pulmonary:     Effort: Pulmonary effort is normal.     Breath sounds: Normal breath sounds. No wheezing, rhonchi or rales.  Musculoskeletal:     Cervical back: Neck supple.  Skin:    General: Skin is warm.     Findings: No rash.  Neurological:     Mental Status: She is alert and oriented to person, place, and time.  Psychiatric:        Behavior: Behavior normal.  Previous notes and tests were reviewed. The plan was reviewed with the patient/family, and all questions/concerned were addressed.  It was my pleasure to see Makayla Frazier today and participate in her care. Please feel free to contact me with any questions or concerns.  Sincerely,  Rexene Alberts, DO Allergy & Immunology  [facility] office: [place] office: [phone]

## 2022-06-05 ENCOUNTER — Ambulatory Visit: Payer: Medicare Other | Admitting: Allergy

## 2022-06-05 DIAGNOSIS — B999 Unspecified infectious disease: Secondary | ICD-10-CM

## 2022-06-05 DIAGNOSIS — H1013 Acute atopic conjunctivitis, bilateral: Secondary | ICD-10-CM

## 2022-06-05 DIAGNOSIS — J301 Allergic rhinitis due to pollen: Secondary | ICD-10-CM

## 2022-06-05 DIAGNOSIS — J4540 Moderate persistent asthma, uncomplicated: Secondary | ICD-10-CM

## 2022-06-20 ENCOUNTER — Other Ambulatory Visit: Payer: Self-pay | Admitting: Internal Medicine

## 2022-07-08 ENCOUNTER — Other Ambulatory Visit: Payer: Self-pay | Admitting: Internal Medicine

## 2022-08-25 ENCOUNTER — Ambulatory Visit: Payer: Medicare Other | Admitting: Podiatry

## 2022-08-25 ENCOUNTER — Encounter: Payer: Self-pay | Admitting: Internal Medicine

## 2022-08-25 ENCOUNTER — Ambulatory Visit (INDEPENDENT_AMBULATORY_CARE_PROVIDER_SITE_OTHER): Payer: Medicare Other | Admitting: Internal Medicine

## 2022-08-25 VITALS — BP 126/72 | HR 70 | Ht 60.0 in | Wt 163.6 lb

## 2022-08-25 DIAGNOSIS — E1165 Type 2 diabetes mellitus with hyperglycemia: Secondary | ICD-10-CM | POA: Diagnosis not present

## 2022-08-25 DIAGNOSIS — Z794 Long term (current) use of insulin: Secondary | ICD-10-CM | POA: Diagnosis not present

## 2022-08-25 DIAGNOSIS — E876 Hypokalemia: Secondary | ICD-10-CM

## 2022-08-25 DIAGNOSIS — E785 Hyperlipidemia, unspecified: Secondary | ICD-10-CM | POA: Diagnosis not present

## 2022-08-25 LAB — LIPID PANEL
Cholesterol: 128 mg/dL (ref 0–200)
HDL: 45.3 mg/dL (ref >39.00)
LDL Cholesterol: 61 mg/dL (ref 0–99)
NonHDL: 82.36
Total CHOL/HDL Ratio: 3
Triglycerides: 106 mg/dL (ref 0.0–149.0)
VLDL: 21.2 mg/dL (ref 0.0–40.0)

## 2022-08-25 LAB — BASIC METABOLIC PANEL
BUN: 22 mg/dL (ref 6–23)
CO2: 30 mEq/L (ref 19–32)
Calcium: 9.4 mg/dL (ref 8.4–10.5)
Chloride: 96 mEq/L (ref 96–112)
Creatinine, Ser: 1.29 mg/dL — ABNORMAL HIGH (ref 0.40–1.20)
GFR: 48.29 mL/min — ABNORMAL LOW (ref >60.00)
Glucose, Bld: 137 mg/dL — ABNORMAL HIGH (ref 70–99)
Potassium: 3.1 mEq/L — ABNORMAL LOW (ref 3.5–5.1)
Sodium: 135 mEq/L (ref 135–145)

## 2022-08-25 LAB — POCT GLYCOSYLATED HEMOGLOBIN (HGB A1C): Hemoglobin A1C: 7.8 % — AB (ref 4.0–5.6)

## 2022-08-25 MED ORDER — TRESIBA FLEXTOUCH 100 UNIT/ML ~~LOC~~ SOPN: 24.0000 [IU] | PEN_INJECTOR | Freq: Every day | SUBCUTANEOUS | 3 refills | Status: DC

## 2022-08-25 MED ORDER — INSULIN LISPRO (1 UNIT DIAL) 100 UNIT/ML (KWIKPEN): PEN_INJECTOR | SUBCUTANEOUS | 3 refills | Status: DC

## 2022-08-25 MED ORDER — TRULICITY 3 MG/0.5ML ~~LOC~~ SOAJ: 3.0000 mg | SUBCUTANEOUS | 3 refills | Status: DC

## 2022-08-25 MED ORDER — INSULIN PEN NEEDLE 31G X 5 MM MISC: 1.0000 | Freq: Four times a day (QID) | 3 refills | Status: DC

## 2022-08-25 MED ORDER — PIOGLITAZONE HCL 30 MG PO TABS: 30.0000 mg | ORAL_TABLET | Freq: Every day | ORAL | 3 refills | Status: DC

## 2022-08-25 NOTE — Patient Instructions (Signed)
-  Continue Tresiba 24  units once daily  -Continue Trulicity 3 mg weekly  -Continue Actos 30 mg daily  -Humalog correctional insulin:  Use the scale below to help guide you before each meal   Blood sugar before meal Number of units to inject  Less than 145 0 unit  146 -  170 1 units  171 -  195 2 units  196 -  220 3 units  221 -  245 4 units  246 -  270 5 units  271 -  295 6 units  296 -  320 7 units  321 -  345 8 units  346 - 370 9 units  371 - 395 10 units  396 - 420 11 units       -HOW TO TREAT LOW BLOOD SUGARS (Blood sugar LESS THAN 70 MG/DL) Please follow the RULE OF 15 for the treatment of hypoglycemia treatment (when your (blood sugars are less than 70 mg/dL)   STEP 1: Take 15 grams of carbohydrates when your blood sugar is low, which includes:  3-4 GLUCOSE TABS  OR 3-4 OZ OF JUICE OR REGULAR SODA OR ONE TUBE OF GLUCOSE GEL    STEP 2: RECHECK blood sugar in 15 MINUTES STEP 3: If your blood sugar is still low at the 15 minute recheck --> then, go back to STEP 1 and treat AGAIN with another 15 grams of carbohydrates.

## 2022-08-25 NOTE — Progress Notes (Unsigned)
Name: Makayla Frazier  Age/ Sex: 51 y.o., female   MRN/ DOB: [id number], 10-24-1971     PCP: Elwyn Reach, MD   Reason for Endocrinology Evaluation: Type 2 Diabetes Mellitus  Initial Endocrine Consultative Visit:  12/25/2019    PATIENT IDENTIFIER: [name] is a 51 y.o. female with a past medical history of T2DM, HTN and seizure disorder . The patient has followed with Endocrinology clinic since 12/25/2019 for consultative assistance with management of her diabetes.  DIABETIC HISTORY:  [name] was diagnosed with T2DM 2009.  She was initially on glipizide and Metformin, but developed diarrhea.  She is intolerant to Victoza due to rash.  Invokana because genital infections. Her hemoglobin A1c has ranged from  7.0% in 2011, peaking at 11.3% in 3013.   On her initial visit to our clinic she had an A1c of 9.4%, she was on Trulicity only.  Glipizide was added  Glipizide switched to basal insulin in 03/2020 due to worsening hyperglycemia.    Pioglitazone started 05/2021 Started novolog 12/2021  SUBJECTIVE:   During the last visit (04/21/2022): A1c 9.4%   Today (08/25/2022): [name] is here for a follow up on diabetes management.  She checked her blood sugars multiple times a day. The patient is not known to have hypoglycemic episodes since the last clinic visit.    Follows with [place] , last visit 05/23/2022  Has occasional nausea and constipation  She is on disability      HOME DIABETES REGIMEN:  Tresiba 30 units daily - 24 units  Trulicity 3 mg weekly (Fridays)  Pioglitazone 30 mg daily  CF: Humalog  (BG-120/25)    Statin: Yes ACE-I/ARB: Yes     CONTINUOUS GLUCOSE MONITORING RECORD INTERPRETATION    Dates of Recording:9/2-9/15/2023  Sensor description:dexcom   Results statistics:   CGM use % of time 93  Average and SD 178/39  Time in range 57 %  % Time Above 180 37  % Time above 250 6  % Time Below target 0     Glycemic  patterns summary: BG's optimal overnight with occasional hyperglycemia during the day   Hyperglycemic episodes  postprandial  Hypoglycemic episodes occurred n/a  Overnight periods:trends down       DIABETIC COMPLICATIONS: Microvascular complications:  Neuropathy Denies: CKD , retinopathy Last eye exam: Completed 2022   Macrovascular complications:  TIA Denies: CAD, PVD, CVA  HISTORY:  Past Medical History:  Past Medical History:  Diagnosis Date   Anemia    Arthritis    Left leg   Asthma    BV (bacterial vaginosis) 5038   Complication of anesthesia    Seizures in PACU after surgery 3/15   DM (diabetes mellitus) ([place])    diagnosed 2009   DVT (deep venous thrombosis) ([place]) 2011   pt had IUD; also 05/2012   Dysfunctional uterine bleeding    Seen by Dr. Leo Grosser, GYN; pending hysterectomy as of 07/2012   H/O hypercoagulable state    2/2 contraception   Headache(784.0)    migraines   Herpes simplex without mention of complication 07/8279   HSV-2   HLD (hyperlipidemia)    Hypertension    Labial lesion 2013   Major depression    Hx suicide attempt in 2009, [facility] admissions   Mastodynia    Neuromuscular disorder ([place])    neuropathy   Neuropathy    Obesity    Oligomenorrhea 2011   PE (pulmonary embolism)    2009 -  on oral contraception; multiple   Post - coital bleeding 2012   Seizures ([place]) 2015   last episode May 2015   Sleep apnea    Status post placement of implantable loop recorder    Stroke ([place])    TIA 2013   Syncopal episodes    unknown etiology   Thyroid disease    hypothyroidism (pt denies)   Vaginal Pap smear, abnormal    Venous insufficiency    Vitamin D deficiency    unknown to pt.   Past Surgical History:  Past Surgical History:  Procedure Laterality Date   BILATERAL SALPINGECTOMY  12/18/2012   Procedure: BILATERAL SALPINGECTOMY;  Surgeon: Eldred Manges, MD;  Location: [facility];  Service: Gynecology;  Laterality: Bilateral;   BLADDER  SUSPENSION N/A 02/13/2014   Procedure: TRANSVAGINAL TAPE (TVT) PROCEDURE;  Surgeon: Delice Lesch, MD;  Location: [facility];  Service: Gynecology;  Laterality: N/A;   BREAST REDUCTION SURGERY     Dr. Eugene Garnet [facility]     Implantable Loop recorder; no arrhythmias associated with synocpal spells; loop explanted 07-2013   CHOLECYSTECTOMY     DILATATION & CURRETTAGE/HYSTEROSCOPY WITH RESECTOCOPE  2007 & 2013   ENDOMETRIAL BIOPSY  2011   IUD REMOVAL  12/18/2012   Procedure: INTRAUTERINE DEVICE (IUD) REMOVAL;  Surgeon: Eldred Manges, MD;  Location: [facility];  Service: Gynecology;  Laterality: N/A;  Removed during prep by E. Powell PA   LAPAROSCOPIC ASSISTED VAGINAL HYSTERECTOMY  12/18/2012   Procedure: LAPAROSCOPIC ASSISTED VAGINAL HYSTERECTOMY;  Surgeon: Eldred Manges, MD;  Location: [facility];  Service: Gynecology;  Laterality: N/A;   LOOP RECORDER EXPLANT N/A 07/17/2013   Procedure: LOOP RECORDER EXPLANT;  Surgeon: Thompson Grayer, MD;  [facility];  Service: Cardiovascular;  Laterality: N/A;   TRANSVAGINAL TAPE (TVT) REMOVAL N/A 04/28/2014   Procedure: Removal of suburethral mesh;  Surgeon: Delice Lesch, MD;  Location: [facility];  Service: Gynecology;  Laterality: N/A;   WISDOM TOOTH EXTRACTION     Social History:  reports that she has never smoked. She has never used smokeless tobacco. She reports that she does not currently use alcohol. She reports that she does not use drugs. Family History:  Family History  Problem Relation Age of Onset   Melanoma Father 25   Diabetes Father    Hypertension Father    Kidney disease Father    Ulcerative colitis Mother 48   Diabetes Mother    Hypertension Mother    Breast cancer Paternal Grandmother    Cancer Paternal Grandmother    Diabetes type II Maternal Grandmother        deceased 41   Diabetes Maternal Grandmother    Pulmonary embolism Paternal Grandfather    Stroke Maternal Grandfather       HOME MEDICATIONS: Allergies as of 08/25/2022       Reactions   Silver Other (See Comments), Rash   Canagliflozin Other (See Comments)   Codeine Nausea And Vomiting   Pt takes Percocet without problems   Darvocet [propoxyphene N-acetaminophen] Nausea And Vomiting   Glipizide    Diarrhea and abdominal pain    Hydrocodone    Other reaction(s): N/V   Hydrocodone-acetaminophen Nausea And Vomiting   Lantus [insulin Glargine]    Burning at injection site    Liraglutide    Other reaction(s): upset stomach   Metformin Hcl    Other reaction(s): diarrhea   Propoxyphene    Other reaction(s):  nausea   Hydrocodone-acetaminophen Nausea And Vomiting   Vomiting   Latex Rash   Tape Itching, Rash   Tramadol Nausea Only        Medication List        Accurate as of August 25, 2022 11:41 AM. If you have any questions, ask your nurse or doctor.          STOP taking these medications    ALPRAZolam 0.5 MG tablet Commonly known as: Duanne Moron Stopped by: Dorita Sciara, MD   FreeStyle Libre 2 Sensor Misc Stopped by: Dorita Sciara, MD   oxyCODONE-acetaminophen 5-325 MG tablet Commonly known as: PERCOCET/ROXICET Stopped by: Dorita Sciara, MD       TAKE these medications    acetaminophen 500 MG tablet Commonly known as: TYLENOL Take 1,000 mg by mouth every 6 (six) hours as needed for moderate pain.   albuterol 108 (90 Base) MCG/ACT inhaler Commonly known as: VENTOLIN HFA Inhale 2 puffs into the lungs every 6 (six) hours as needed for wheezing or shortness of breath.   amitriptyline 25 MG tablet Commonly known as: ELAVIL 1 tablet at bedtime.   amLODipine 5 MG tablet Commonly known as: NORVASC Take 5 mg by mouth daily.   atorvastatin 10 MG tablet Commonly known as: LIPITOR Take 10 mg by mouth at bedtime.   Azelastine HCl 137 MCG/SPRAY Soln SMARTSIG:1-2 Spray(s) Both Nares Twice Daily PRN   budesonide-formoterol 160-4.5 MCG/ACT  inhaler Commonly known as: Symbicort Inhale 2 puffs into the lungs in the morning and at bedtime. with spacer and rinse mouth afterwards.   Symbicort 160-4.5 MCG/ACT inhaler Generic drug: budesonide-formoterol Inhale 2 puffs into the lungs in the morning and at bedtime.   chlorthalidone 25 MG tablet Commonly known as: HYGROTON TAKE 2 TABLETS(50 MG) BY MOUTH DAILY   cyclobenzaprine 10 MG tablet Commonly known as: FLEXERIL cyclobenzaprine 10 mg tablet   EPINEPHrine 0.3 mg/0.3 mL Soaj injection Commonly known as: EPI-PEN See admin instructions.   EPINEPHrine 0.3 mg/0.3 mL Soaj injection Commonly known as: EPI-PEN Inject 0.3 mg into the muscle as needed for anaphylaxis.   Fluocinolone Acetonide Body 0.01 % Oil fluocinolone 0.01 % scalp oil and shower cap  APPLY SMALL AMOUNT TOPICALLY TO THE AFFECTED AREA 2 TO 3 TIMES WEEKLY AS DIRECTED   Fluocinolone Acetonide Scalp 0.01 % Oil   insulin lispro 100 UNIT/ML KwikPen Commonly known as: HumaLOG KwikPen 40 units max daily dose   Insulin Pen Needle 31G X 5 MM Misc 1 Device by Does not apply route 4 (four) times daily. What changed: Another medication with the same name was removed. Continue taking this medication, and follow the directions you see here. Changed by: Dorita Sciara, MD   levocetirizine 5 MG tablet Commonly known as: XYZAL Take 1 tablet (5 mg total) by mouth every evening.   lisinopril 2.5 MG tablet Commonly known as: ZESTRIL Take 2.5 mg by mouth daily. What changed: Another medication with the same name was removed. Continue taking this medication, and follow the directions you see here. Changed by: Dorita Sciara, MD   loratadine 10 MG tablet Commonly known as: CLARITIN Take 10 mg by mouth daily.   montelukast 10 MG tablet Commonly known as: SINGULAIR Take 10 mg by mouth at bedtime. Reported on 02/16/2016   omeprazole 40 MG capsule Commonly known as: PRILOSEC Take 40 mg by mouth 2 (two) times  daily.   ondansetron 4 MG disintegrating tablet Commonly known as: Zofran ODT Take 1 tablet (  4 mg total) by mouth every 8 (eight) hours as needed for nausea or vomiting.   ondansetron 4 MG tablet Commonly known as: ZOFRAN Take by mouth.   OneTouch Verio test strip Generic drug: glucose blood 1 each by Other route daily in the afternoon. Use as instructed   oxyCODONE 5 MG immediate release tablet Commonly known as: Oxy IR/ROXICODONE Take 5 mg by mouth daily.   pioglitazone 30 MG tablet Commonly known as: Actos Take 1 tablet (30 mg total) by mouth daily.   polyethylene glycol-electrolytes 420 g solution Commonly known as: NuLYTELY Take by mouth as directed.   pregabalin 150 MG capsule Commonly known as: LYRICA Take 150 mg by mouth daily. What changed: Another medication with the same name was removed. Continue taking this medication, and follow the directions you see here. Changed by: Dorita Sciara, MD   Ryaltris [phone] MCG/ACT Susp Generic drug: Olopatadine-Mometasone Place 1-2 sprays into the nose in the morning and at bedtime.   topiramate 25 MG tablet Commonly known as: TOPAMAX Take by mouth.   topiramate 25 MG tablet Commonly known as: TOPAMAX For next pillpack in March, please dispense Topiramate '25mg'$ : take 2 tablets every night   Tresiba FlexTouch 100 UNIT/ML FlexTouch Pen Generic drug: insulin degludec Inject 24 Units into the skin daily.   Trulicity 3 YQ/6.5HQ Sopn Generic drug: Dulaglutide 3 mg by Other route once a week. What changed: See the new instructions. Changed by: Dorita Sciara, MD   Xarelto 20 MG Tabs tablet Generic drug: rivaroxaban Take 20 mg by mouth at bedtime.         OBJECTIVE:   Vital Signs: BP 126/72 (BP Location: Left Arm, Patient Position: Sitting, Cuff Size: Small)   Pulse 70   Ht 5' (1.524 m)   Wt 163 lb 9.6 oz (74.2 kg)   LMP 12/19/2011   SpO2 98%   BMI 31.95 kg/m   Wt Readings from Last 3  Encounters:  08/25/22 163 lb 9.6 oz (74.2 kg)  04/21/22 160 lb 6.4 oz (72.8 kg)  01/24/22 164 lb 12.8 oz (74.8 kg)     Exam: General: Pt appears well and is in NAD  Lungs: Clear with good BS bilat with no rales, rhonchi, or wheezes  Heart: RRR   Extremities: No pretibial edema.   Neuro: MS is good with appropriate affect, pt is alert and Ox3   DM foot exam: 04/21/2022   The skin of the feet is intact without sores or ulcerations. The pedal pulses are 2+ on right and 2+ on left. The sensation is decreased  to a screening 5.07, 10 gram monofilament bilaterally     DATA REVIEWED:  Lab Results  Component Value Date   HGBA1C 7.8 (A) 08/25/2022   HGBA1C 9.4 (A) 04/21/2022   HGBA1C 9.6 (A) 12/16/2021   ****  ASSESSMENT / PLAN / RECOMMENDATIONS:   Type 2 Diabetes Mellitus, with improving glycemic control, With neuropathic complications - Most recent A1c of 7.8 %. Goal A1c <7.0%.  -I have praised the patient on improved glycemic control, A1c down from 9.4% -She has self reduced [place] due to hypoglycemic episodes -She was seen by our CDE and CeQUR was suggested, but she does NOT call 5 for this as she is not on standing 3 times daily dosing.  The last time she used Humalog was a week ago hence CeQur  would not justify sporadic use, as she will end up wasting insulin -No changes at this time -She has been  encouraged to continue to use the Dexcom to check glucose before meals, and use correction factor as needed for Humalog -Intolerant to Invokana -Glipizide caused diarrhea and abdominal pain    MEDICATIONS:  Continue Tresiba 24  units daily  continue Trulicity 3 mg weekly  Continue pioglitazone 30  mg daily Continue correction factor :Humalog( BG-120/25) TIDQAC  EDUCATION / INSTRUCTIONS: BG monitoring instructions: Patient is instructed to check her blood sugars 3 times a day, before meals  I reviewed the Rule of 15 for the treatment of hypoglycemia in detail with the  patient. Literature supplied.   2) Diabetic complications:  Eye: Does not have known diabetic retinopathy.  Neuro/ Feet: Does have known diabetic peripheral neuropathy. Renal: Patient does not have known baseline CKD. She is on an ACEI/ARB at present.   3) Dyslipidemia :  -Lipid panel today****  Medication Atorvastatin 10 mg daily  F/U in 6 months     Signed electronically by: Mack Guise, MD  [facility]  [facility] [address] [place], [place] [address] Phone: [phone] FAX: [phone]   CC: Elwyn Reach, MD [address] [place] [address] Phone: [phone]  Fax: [phone]  Return to Endocrinology clinic as below: Future Appointments  Date Time Provider [place]  08/29/2022 10:15 AM Marzetta Board, DPM [facility]  09/15/2022 11:30 AM Cameron Sprang, MD [facility] None

## 2022-08-29 ENCOUNTER — Ambulatory Visit (INDEPENDENT_AMBULATORY_CARE_PROVIDER_SITE_OTHER): Payer: Medicare Other | Admitting: Podiatry

## 2022-08-29 ENCOUNTER — Encounter: Payer: Self-pay | Admitting: Podiatry

## 2022-08-29 DIAGNOSIS — M79674 Pain in right toe(s): Secondary | ICD-10-CM

## 2022-08-29 DIAGNOSIS — N898 Other specified noninflammatory disorders of vagina: Secondary | ICD-10-CM | POA: Insufficient documentation

## 2022-08-29 DIAGNOSIS — M79675 Pain in left toe(s): Secondary | ICD-10-CM

## 2022-08-29 DIAGNOSIS — J4540 Moderate persistent asthma, uncomplicated: Secondary | ICD-10-CM | POA: Insufficient documentation

## 2022-08-29 DIAGNOSIS — T781XXA Other adverse food reactions, not elsewhere classified, initial encounter: Secondary | ICD-10-CM | POA: Insufficient documentation

## 2022-08-29 DIAGNOSIS — E1165 Type 2 diabetes mellitus with hyperglycemia: Secondary | ICD-10-CM

## 2022-08-29 DIAGNOSIS — N760 Acute vaginitis: Secondary | ICD-10-CM | POA: Insufficient documentation

## 2022-08-29 DIAGNOSIS — Z91040 Latex allergy status: Secondary | ICD-10-CM | POA: Insufficient documentation

## 2022-08-29 DIAGNOSIS — R81 Glycosuria: Secondary | ICD-10-CM | POA: Insufficient documentation

## 2022-08-29 DIAGNOSIS — L503 Dermatographic urticaria: Secondary | ICD-10-CM | POA: Insufficient documentation

## 2022-08-29 DIAGNOSIS — B351 Tinea unguium: Secondary | ICD-10-CM

## 2022-08-29 MED ORDER — POTASSIUM CHLORIDE CRYS ER 20 MEQ PO TBCR: 20.0000 meq | EXTENDED_RELEASE_TABLET | Freq: Every day | ORAL | 0 refills | Status: DC

## 2022-08-29 MED ORDER — LISINOPRIL 5 MG PO TABS: 5.0000 mg | ORAL_TABLET | Freq: Every day | ORAL | 3 refills | Status: DC

## 2022-08-30 ENCOUNTER — Ambulatory Visit: Payer: Medicare Other | Admitting: Neurology

## 2022-09-03 NOTE — Progress Notes (Signed)
  Subjective:  Patient ID: Makayla Frazier, female    DOB: 03/26/1971,  MRN: [id number]  51 y.o. female presents with at risk foot care with history of diabetic neuropathy and painful elongated mycotic toenails 1-5 bilaterally which are tender when wearing enclosed shoe gear. Pain is relieved with periodic professional debridement..    Patient states blood glucose was 158 mg/dl today. Last known  HgA1c was 7.4%.    New problem(s): None    PCP: Elwyn Reach, MD and last visit was: July, 2023.  Review of Systems: Negative except as noted in the HPI.   Allergies  Allergen Reactions   Silver Other (See Comments) and Rash   Canagliflozin Other (See Comments)   Codeine Nausea And Vomiting    Pt takes Percocet without problems    Darvocet [Propoxyphene N-Acetaminophen] Nausea And Vomiting   Glipizide     Diarrhea and abdominal pain    Hydrocodone     Other reaction(s): N/V   Hydrocodone-Acetaminophen Nausea And Vomiting   Lantus [Insulin Glargine]     Burning at injection site    Liraglutide     Other reaction(s): upset stomach   Metformin Hcl     Other reaction(s): diarrhea   Propoxyphene     Other reaction(s): nausea   Hydrocodone-Acetaminophen Nausea And Vomiting    Vomiting   Latex Rash   Tape Itching and Rash   Tramadol Nausea Only    Objective:  There were no vitals filed for this visit. Constitutional Patient is a pleasant 51 y.o. African American female in NAD. AAO x 3.  Vascular Capillary fill time to digits immediate b/l.  DP/PT pulse(s) are palpable b/l lower extremities. Pedal hair present. Lower extremity skin temperature gradient within normal limits. No pain with calf compression b/l. No edema noted b/l lower extremities. No cyanosis or clubbing noted.   Neurologic Protective sensation intact 5/5 intact bilaterally with 10g monofilament b/l. Vibratory sensation intact b/l. No clonus b/l. Pt has subjective symptoms of neuropathy.  Dermatologic Pedal skin is  warm and supple b/l.  No open wounds b/l lower extremities. No interdigital macerations b/l lower extremities. Toenails 1-5 b/l elongated, discolored, dystrophic, thickened, crumbly with subungual debris and tenderness to dorsal palpation.   Orthopedic: Normal muscle strength 5/5 to all lower extremity muscle groups bilaterally. Patient ambulates independent of any assistive aids. HAV with bunion deformity noted b/l LE left>right.      Latest Ref Rng & Units 08/25/2022   11:17 AM 04/21/2022    9:52 AM 12/16/2021    9:53 AM 09/09/2021    8:12 AM  Hemoglobin A1C  Hemoglobin-A1c 4.0 - 5.6 % 7.8  9.4  9.6  11.4    Assessment:   1. Pain due to onychomycosis of toenails of both feet   2. Type 2 diabetes mellitus with hyperglycemia, without long-term current use of insulin ([place])    Plan:  Patient was evaluated and treated and all questions answered. Consent given for treatment as described below: -Examined patient. -Continue foot and shoe inspections daily. Monitor blood glucose per PCP/Endocrinologist's recommendations. -Toenails 1-5 b/l were debrided in length and girth with sterile nail nippers and dremel without iatrogenic bleeding.  -Patient/POA to call should there be question/concern in the interim.  Return in about 3 months (around 11/28/2022).  Marzetta Board, DPM

## 2022-09-04 ENCOUNTER — Encounter: Payer: Self-pay | Admitting: Internal Medicine

## 2022-09-04 ENCOUNTER — Other Ambulatory Visit: Payer: Self-pay

## 2022-09-04 MED ORDER — PIOGLITAZONE HCL 30 MG PO TABS: 30.0000 mg | ORAL_TABLET | Freq: Every day | ORAL | 3 refills | Status: DC

## 2022-09-04 MED ORDER — TRESIBA FLEXTOUCH 100 UNIT/ML ~~LOC~~ SOPN: 24.0000 [IU] | PEN_INJECTOR | Freq: Every day | SUBCUTANEOUS | 3 refills | Status: DC

## 2022-09-04 MED ORDER — TRULICITY 3 MG/0.5ML ~~LOC~~ SOAJ: 3.0000 mg | SUBCUTANEOUS | 3 refills | Status: DC

## 2022-09-04 MED ORDER — INSULIN LISPRO (1 UNIT DIAL) 100 UNIT/ML (KWIKPEN): PEN_INJECTOR | SUBCUTANEOUS | 3 refills | Status: AC

## 2022-09-04 MED ORDER — LISINOPRIL 5 MG PO TABS: 5.0000 mg | ORAL_TABLET | Freq: Every day | ORAL | 3 refills | Status: DC

## 2022-09-14 ENCOUNTER — Other Ambulatory Visit: Payer: Self-pay | Admitting: Allergy

## 2022-09-15 ENCOUNTER — Ambulatory Visit: Payer: Medicare Other | Admitting: Neurology

## 2022-09-27 ENCOUNTER — Emergency Department (HOSPITAL_BASED_OUTPATIENT_CLINIC_OR_DEPARTMENT_OTHER)
Admission: EM | Admit: 2022-09-27 | Discharge: 2022-09-27 | Disposition: A | Discharge status: Home / Self Care | Payer: Medicare Other | Attending: Emergency Medicine | Admitting: Emergency Medicine

## 2022-09-27 ENCOUNTER — Emergency Department (HOSPITAL_BASED_OUTPATIENT_CLINIC_OR_DEPARTMENT_OTHER): Payer: Medicare Other

## 2022-09-27 ENCOUNTER — Other Ambulatory Visit: Payer: Self-pay

## 2022-09-27 ENCOUNTER — Encounter (HOSPITAL_BASED_OUTPATIENT_CLINIC_OR_DEPARTMENT_OTHER): Payer: Self-pay | Admitting: Emergency Medicine

## 2022-09-27 DIAGNOSIS — E119 Type 2 diabetes mellitus without complications: Secondary | ICD-10-CM | POA: Diagnosis not present

## 2022-09-27 DIAGNOSIS — I10 Essential (primary) hypertension: Secondary | ICD-10-CM | POA: Insufficient documentation

## 2022-09-27 DIAGNOSIS — Z20822 Contact with and (suspected) exposure to covid-19: Secondary | ICD-10-CM | POA: Insufficient documentation

## 2022-09-27 DIAGNOSIS — J45909 Unspecified asthma, uncomplicated: Secondary | ICD-10-CM | POA: Diagnosis not present

## 2022-09-27 DIAGNOSIS — J209 Acute bronchitis, unspecified: Secondary | ICD-10-CM | POA: Diagnosis not present

## 2022-09-27 DIAGNOSIS — R059 Cough, unspecified: Secondary | ICD-10-CM | POA: Diagnosis present

## 2022-09-27 LAB — RESP PANEL BY RT-PCR (FLU A&B, COVID) ARPGX2
Influenza A by PCR: NEGATIVE
Influenza B by PCR: NEGATIVE
SARS Coronavirus 2 by RT PCR: NEGATIVE

## 2022-09-27 MED ORDER — AZITHROMYCIN 250 MG PO TABS: 250.0000 mg | ORAL_TABLET | Freq: Every day | ORAL | 0 refills | Status: DC

## 2022-09-27 MED ORDER — BENZONATATE 100 MG PO CAPS: 100.0000 mg | ORAL_CAPSULE | Freq: Three times a day (TID) | ORAL | 0 refills | Status: DC | PRN

## 2022-09-27 NOTE — ED Provider Notes (Signed)
Emergency Department Provider Note   I have reviewed the triage vital signs and the nursing notes.   HISTORY  Chief Complaint Cough   HPI Makayla Frazier is a 51 y.o. female presents to the emergency department for evaluation of cough and congestion for the past week.  She feels soreness in her abdomen that she appreciates from coughing.  No severe abdominal pain, vomiting, diarrhea.  She was prescribed prednisone by her allergist but states she is hesitant to take it because of her type 2 diabetes.  She does have an inhaler which she has been using.  She reports mainly having nasal congestion but now deeper cough.  No chest pain.   Past Medical History:  Diagnosis Date   Anemia    Arthritis    Left leg   Asthma    BV (bacterial vaginosis) 4235   Complication of anesthesia    Seizures in PACU after surgery 3/15   DM (diabetes mellitus) ([place])    diagnosed 2009   DVT (deep venous thrombosis) ([place]) 2011   pt had IUD; also 05/2012   Dysfunctional uterine bleeding    Seen by Dr. Leo Grosser, GYN; pending hysterectomy as of 07/2012   H/O hypercoagulable state    2/2 contraception   Headache(784.0)    migraines   Herpes simplex without mention of complication 02/6143   HSV-2   HLD (hyperlipidemia)    Hypertension    Labial lesion 2013   Major depression    Hx suicide attempt in 2009, [facility] admissions   Mastodynia    Neuromuscular disorder ([place])    neuropathy   Neuropathy    Obesity    Oligomenorrhea 2011   PE (pulmonary embolism)    2009 - on oral contraception; multiple   Post - coital bleeding 2012   Seizures ([place]) 2015   last episode May 2015   Sleep apnea    Status post placement of implantable loop recorder    Stroke ([place])    TIA 2013   Syncopal episodes    unknown etiology   Thyroid disease    hypothyroidism (pt denies)   Vaginal Pap smear, abnormal    Venous insufficiency    Vitamin D deficiency    unknown to pt.    Review of Systems  Constitutional: No  fever/chills ENT: No sore throat. Positive congestion.  Cardiovascular: Denies chest pain. Respiratory: Denies shortness of breath. Positive cough.  Gastrointestinal: No abdominal pain.  No nausea, no vomiting.  No diarrhea.  No constipation. Genitourinary: Negative for dysuria. Musculoskeletal: Negative for back pain. Skin: Negative for rash. Neurological: Negative for headaches.   ____________________________________________   PHYSICAL EXAM:  VITAL SIGNS: ED Triage Vitals  Enc Vitals Group     BP 09/27/22 1337 (!) 176/89     Pulse Rate 09/27/22 1337 86     Resp 09/27/22 1337 18     Temp 09/27/22 1337 98.6 F (37 C)     Temp Source 09/27/22 1337 Oral     SpO2 09/27/22 1337 96 %     Weight 09/27/22 1333 165 lb (74.8 kg)     Height 09/27/22 1333 5' (1.524 m)   Constitutional: Alert and oriented. Well appearing and in no acute distress. Eyes: Conjunctivae are normal. Head: Atraumatic. Nose: No congestion/rhinnorhea. Mouth/Throat: Mucous membranes are moist.  Neck: No stridor.   Cardiovascular: Normal rate, regular rhythm. Good peripheral circulation. Grossly normal heart sounds.   Respiratory: Normal respiratory effort.  No retractions. Lungs CTAB. Gastrointestinal: Soft and  nontender. No distention.  Musculoskeletal: No lower extremity tenderness nor edema. No gross deformities of extremities. Neurologic:  Normal speech and language.    ____________________________________________   LABS (all labs ordered are listed, but only abnormal results are displayed)  Labs Reviewed  RESP PANEL BY RT-PCR (FLU A&B, COVID) [id number]   ____________________________________________  RADIOLOGY  DG Chest 2 View  Result Date: 09/27/2022 CLINICAL DATA:  Productive cough over the last week. EXAM: CHEST - 2 VIEW COMPARISON:  11/08/2019 FINDINGS: Heart and mediastinal shadows are normal. There is minimal central bronchial thickening but no infiltrate, collapse or effusion. No abnormal  bone finding. IMPRESSION: Possible bronchitis. No consolidation or collapse. Electronically Signed   By: Nelson Chimes M.D.   On: 09/27/2022 13:59    ____________________________________________   PROCEDURES  Procedure(s) performed:   Procedures  None  ____________________________________________   INITIAL IMPRESSION / ASSESSMENT AND PLAN / ED COURSE  Pertinent labs & imaging results that were available during my care of the patient were reviewed by me and considered in my medical decision making (see chart for details).   This patient is Presenting for Evaluation of cough, which does require a range of treatment options, and is a complaint that involves a moderate risk of morbidity and mortality.  The Differential Diagnoses include URI, COVID, Flu, CAP, CHF, ACS, PE, etc.    Clinical Laboratory Tests Ordered, included COVID and flu negative.  Radiologic Tests Ordered, included CXR. I independently interpreted the images and agree with radiology interpretation.  Social Determinants of Health Risk patient is a non-smoker.   Medical Decision Making: Summary:  Patient presents emergency department for evaluation of cough/congestion symptoms.  Lungs are clear.  No pneumonia on chest x-ray.  COVID and flu negative.  Plan for continued supportive care and PCP follow-up.  Disposition: discharge  ____________________________________________  FINAL CLINICAL IMPRESSION(S) / ED DIAGNOSES  Final diagnoses:  Acute bronchitis, unspecified organism     NEW OUTPATIENT MEDICATIONS STARTED DURING THIS VISIT:  Discharge Medication List as of 09/27/2022  2:21 PM     START taking these medications   Details  azithromycin (ZITHROMAX) 250 MG tablet Take 1 tablet (250 mg total) by mouth daily. Take first 2 tablets together, then 1 every day until finished., Starting Wed 09/27/2022, Normal    benzonatate (TESSALON) 100 MG capsule Take 1 capsule (100 mg total) by mouth 3 (three) times daily  as needed for cough., Starting Wed 09/27/2022, Normal        Note:  This document was prepared using Dragon voice recognition software and may include unintentional dictation errors.  Nanda Quinton, MD, [facility] Emergency Medicine    Linn Goetze, [facility], MD 09/28/22 [phone]

## 2022-09-27 NOTE — ED Triage Notes (Signed)
Productive cough and chest congestion x 1 week. States her abdomen is sore from coughing. Seen by allergy doctor and prescribed prednisone but does not want to take due to PMH of T2DM.

## 2022-09-27 NOTE — Discharge Instructions (Signed)
I am treating you for her acute bronchitis.  Please follow closely with your primary care physician and return with any new or suddenly worsening symptoms.  Your COVID and flu test were negative today.  Your x-ray did not show pneumonia.

## 2022-10-09 ENCOUNTER — Ambulatory Visit: Payer: Medicare Other | Admitting: Neurology

## 2022-11-02 ENCOUNTER — Encounter: Payer: Self-pay | Admitting: Internal Medicine

## 2022-11-03 ENCOUNTER — Other Ambulatory Visit: Payer: Self-pay | Admitting: Internal Medicine

## 2022-11-06 ENCOUNTER — Other Ambulatory Visit: Payer: Self-pay | Admitting: Allergy

## 2022-11-06 ENCOUNTER — Other Ambulatory Visit: Payer: Self-pay

## 2022-11-06 MED ORDER — ONETOUCH VERIO VI STRP: ORAL_STRIP | 3 refills | Status: DC

## 2022-12-13 ENCOUNTER — Ambulatory Visit (INDEPENDENT_AMBULATORY_CARE_PROVIDER_SITE_OTHER): Payer: Medicare Other | Admitting: Podiatry

## 2022-12-13 ENCOUNTER — Encounter: Payer: Self-pay | Admitting: Podiatry

## 2022-12-13 VITALS — BP 112/70

## 2022-12-13 DIAGNOSIS — M79674 Pain in right toe(s): Secondary | ICD-10-CM

## 2022-12-13 DIAGNOSIS — E1165 Type 2 diabetes mellitus with hyperglycemia: Secondary | ICD-10-CM | POA: Diagnosis not present

## 2022-12-13 DIAGNOSIS — B351 Tinea unguium: Secondary | ICD-10-CM | POA: Diagnosis not present

## 2022-12-13 DIAGNOSIS — M79675 Pain in left toe(s): Secondary | ICD-10-CM

## 2022-12-13 NOTE — Progress Notes (Signed)
  Subjective:  Patient ID: Makayla Frazier, female    DOB: 1971/08/11,  MRN: [id number]  Makayla Frazier presents to clinic today for preventative diabetic foot care. Chief Complaint  Patient presents with   Nail Problem    DFC BS-did not check today A1C-7.8 PCP-[facility] PCP VST-last week   She states her left ankle is a little swollen on today's visit. She denies any trauma, but does have h/o DVT LLE and h/o silent PE. States she will call Dr. Jonelle Sidle and he usually orders a venous USD to rule out DVT.  PCP is Elwyn Reach, MD.  Allergies  Allergen Reactions   Silver Other (See Comments) and Rash   Canagliflozin Other (See Comments)   Codeine Nausea And Vomiting    Pt takes Percocet without problems    Darvocet [Propoxyphene N-Acetaminophen] Nausea And Vomiting   Glipizide     Diarrhea and abdominal pain    Hydrocodone     Other reaction(s): N/V   Hydrocodone-Acetaminophen Nausea And Vomiting   Lantus [Insulin Glargine]     Burning at injection site    Liraglutide     Other reaction(s): upset stomach   Metformin Diarrhea   Metformin Hcl     Other reaction(s): diarrhea   Propoxyphene     Other reaction(s): nausea   Hydrocodone-Acetaminophen Nausea And Vomiting and Other (See Comments)    Vomiting   Latex Rash   Tape Itching and Rash   Tramadol Nausea Only    Review of Systems: Negative except as noted in the HPI.  Objective: No changes noted in today's physical examination. Vitals:   12/13/22 1031  BP: 112/70   Makayla Frazier is a pleasant 52 y.o. female WD, WN in NAD. AAO x 3.  Vascular Capillary fill time to digits immediate b/l.  DP/PT pulse(s) are palpable b/l lower extremities. Pedal hair present. Lower extremity skin temperature gradient within normal limits. No pain with calf compression b/l. Trace edema noted left ankle. No cyanosis or clubbing noted.   Neurologic Protective sensation intact 5/5 intact bilaterally with 10g monofilament b/l. Vibratory  sensation intact b/l. No clonus b/l. Pt has subjective symptoms of neuropathy.  Dermatologic Pedal skin is warm and supple b/l.  No open wounds b/l lower extremities. No interdigital macerations b/l lower extremities. Toenails 1-5 b/l elongated, discolored, dystrophic, thickened, crumbly with subungual debris and tenderness to dorsal palpation.   Orthopedic: Normal muscle strength 5/5 to all lower extremity muscle groups bilaterally. Patient ambulates independent of any assistive aids. HAV with bunion deformity noted b/l LE left>right.   Assessment/Plan: 1. Pain due to onychomycosis of toenails of both feet   2. Type 2 diabetes mellitus with hyperglycemia, without long-term current use of insulin (HCC)     No orders of the defined types were placed in this encounter.   None -Consent given for treatment as described below: -Patient with left ankle swelling with h/o DVT and silent PE. States she will call her PCP. -Continue diabetic foot care principles: inspect feet daily, monitor glucose as recommended by PCP and/or Endocrinologist, and follow prescribed diet per PCP, Endocrinologist and/or dietician. -Continue supportive shoe gear daily. -Mycotic toenails 1-5 bilaterally were debrided in length and girth with sterile nail nippers and dremel without incident. -Patient/POA to call should there be question/concern in the interim.   Return in about 3 months (around 03/14/2023).  Marzetta Board, DPM

## 2023-01-09 ENCOUNTER — Encounter (HOSPITAL_BASED_OUTPATIENT_CLINIC_OR_DEPARTMENT_OTHER): Payer: Self-pay

## 2023-01-09 ENCOUNTER — Emergency Department (HOSPITAL_BASED_OUTPATIENT_CLINIC_OR_DEPARTMENT_OTHER)
Admission: EM | Admit: 2023-01-09 | Discharge: 2023-01-09 | Disposition: A | Discharge status: Home / Self Care | Payer: 59 | Attending: Emergency Medicine | Admitting: Emergency Medicine

## 2023-01-09 ENCOUNTER — Other Ambulatory Visit: Payer: Self-pay

## 2023-01-09 DIAGNOSIS — Z79899 Other long term (current) drug therapy: Secondary | ICD-10-CM | POA: Diagnosis not present

## 2023-01-09 DIAGNOSIS — E1140 Type 2 diabetes mellitus with diabetic neuropathy, unspecified: Secondary | ICD-10-CM | POA: Diagnosis not present

## 2023-01-09 DIAGNOSIS — M79604 Pain in right leg: Secondary | ICD-10-CM

## 2023-01-09 DIAGNOSIS — M5441 Lumbago with sciatica, right side: Secondary | ICD-10-CM | POA: Insufficient documentation

## 2023-01-09 DIAGNOSIS — Z91040 Latex allergy status: Secondary | ICD-10-CM | POA: Diagnosis not present

## 2023-01-09 DIAGNOSIS — I10 Essential (primary) hypertension: Secondary | ICD-10-CM | POA: Diagnosis not present

## 2023-01-09 DIAGNOSIS — Z794 Long term (current) use of insulin: Secondary | ICD-10-CM | POA: Diagnosis not present

## 2023-01-09 DIAGNOSIS — Z7901 Long term (current) use of anticoagulants: Secondary | ICD-10-CM | POA: Insufficient documentation

## 2023-01-09 DIAGNOSIS — M5431 Sciatica, right side: Secondary | ICD-10-CM

## 2023-01-09 LAB — URINALYSIS, ROUTINE W REFLEX MICROSCOPIC
Bilirubin Urine: NEGATIVE
Glucose, UA: NEGATIVE mg/dL
Hgb urine dipstick: NEGATIVE
Ketones, ur: NEGATIVE mg/dL
Leukocytes,Ua: NEGATIVE
Nitrite: NEGATIVE
Protein, ur: NEGATIVE mg/dL
Specific Gravity, Urine: 1.01 (ref 1.005–1.030)
pH: 5.5 (ref 5.0–8.0)

## 2023-01-09 MED ORDER — LIDOCAINE 5 % EX PTCH: 1.0000 | MEDICATED_PATCH | CUTANEOUS | 0 refills | Status: DC

## 2023-01-09 MED ORDER — OXYCODONE HCL 5 MG PO TABS
5.0000 mg | ORAL_TABLET | Freq: Once | ORAL | Status: AC
  Administered 2023-01-09: 5 mg via ORAL
  Filled 2023-01-09: qty 1

## 2023-01-09 NOTE — ED Triage Notes (Signed)
C/o right leg pain radiating from lower back since waking up this morning. Denies known injury.

## 2023-01-09 NOTE — ED Notes (Signed)
Discharge instructions reviewed with patient. Patient verbalizes understanding, no further questions at this time. Medications/prescriptions and follow up information provided. No acute distress noted at time of departure.  

## 2023-01-09 NOTE — Discharge Instructions (Signed)
Please use Tylenol or ibuprofen for pain.  You may use 600 mg ibuprofen every 6 hours or 1000 mg of Tylenol every 6 hours.  You may choose to alternate between the 2.  This would be most effective.  Not to exceed 4 g of Tylenol within 24 hours.  Not to exceed 3200 mg ibuprofen 24 hours.  Please use your muscle relaxant, narcotic pain medication, and the lidocaine patches that I am prescribing in addition to performing the rehab exercises as above.  You need to follow-up with a neurosurgeon if you continue to have pain similar to this that does not improve with treatment.  Especially because your diabetes is under poor control right now I do not think that you would benefit from a steroid at this time despite sciatica back pain, however if you are having significant ongoing symptoms or worsening of your symptoms in the next 1 to 2 weeks you may want to return for further evaluation and possible steroid management.

## 2023-01-09 NOTE — ED Provider Notes (Signed)
[facility] Provider Note   CSN: [id number] Arrival date & time: 01/09/23  1242     History  Chief Complaint  Patient presents with   Leg Pain    Makayla Frazier is a 52 y.o. female with a history significant for diabetes, hypertension, peripheral neuropathy, history of PE, multiple DVTs who is on chronic anticoagulation who presents with concern for right-sided hip, leg pain radiating from lower back into groin since this morning.  She reports that she has had some similar pain for days to weeks but has been significantly worsening this morning with decreased strength of the right lower extremity secondary to pain.  She denies any numbness, tingling.  She endorses some urinary frequency but otherwise denies any incontinence of bowels or bladder.  She denies any history of chronic corticosteroid use, IV drug use, previous history of cancer, recent fever.  She denies any known traumatic injury of the lower back.  She denies any previous diagnosis of sciatica.   Leg Pain Associated symptoms: back pain        Home Medications Prior to Admission medications   Medication Sig Start Date End Date Taking? Authorizing Provider  lidocaine (LIDODERM) 5 % Place 1 patch onto the skin daily. Remove & Discard patch within 12 hours or as directed by MD 01/09/23  Yes Alajia Schmelzer H, PA-C  acetaminophen (TYLENOL) 500 MG tablet Take 1,000 mg by mouth every 6 (six) hours as needed for moderate pain.     [provider]  albuterol (PROVENTIL HFA;VENTOLIN HFA) 108 (90 BASE) MCG/ACT inhaler Inhale 2 puffs into the lungs every 6 (six) hours as needed for wheezing or shortness of breath.    [provider]  amitriptyline (ELAVIL) 25 MG tablet 1 tablet at bedtime.    [provider]  amLODipine (NORVASC) 5 MG tablet Take 5 mg by mouth daily.    [provider]  atorvastatin (LIPITOR) 10 MG tablet Take 10 mg by mouth at  bedtime.    [provider]  Azelastine HCl 137 MCG/SPRAY SOLN SMARTSIG:1-2 Spray(s) Both Nares Twice Daily PRN 04/18/22   [provider]  azithromycin (ZITHROMAX) 250 MG tablet Take 1 tablet (250 mg total) by mouth daily. Take first 2 tablets together, then 1 every day until finished. 09/27/22   Long, Wonda Olds, MD  benzonatate (TESSALON) 100 MG capsule Take 1 capsule (100 mg total) by mouth 3 (three) times daily as needed for cough. 09/27/22   Long, Wonda Olds, MD  budesonide-formoterol (SYMBICORT) 160-4.5 MCG/ACT inhaler Inhale 2 puffs into the lungs in the morning and at bedtime. with spacer and rinse mouth afterwards. 11/07/21   Garnet Sierras, DO  cyclobenzaprine (FLEXERIL) 10 MG tablet cyclobenzaprine 10 mg tablet    [provider]  Dulaglutide (TRULICITY) 3 QA/8.3MH SOPN 3 mg by Other route once a week. 09/04/22   Shamleffer, Melanie Crazier, MD  EPINEPHrine 0.3 mg/0.3 mL IJ SOAJ injection Inject 0.3 mg into the muscle as needed for anaphylaxis.  04/30/18   [provider]  EPINEPHrine 0.3 mg/0.3 mL IJ SOAJ injection See admin instructions.    [provider]  Fluocinolone Acetonide Body 0.01 % OIL fluocinolone 0.01 % scalp oil and shower cap  APPLY SMALL AMOUNT TOPICALLY TO THE AFFECTED AREA 2 TO 3 TIMES WEEKLY AS DIRECTED    [provider]  Fluocinolone Acetonide Scalp 0.01 % OIL  03/08/21   [provider]  glucose blood (ONETOUCH VERIO)  test strip Check blood sugar 3 times daily 11/06/22   Shamleffer, Melanie Crazier, MD  insulin degludec (TRESIBA FLEXTOUCH) 100 UNIT/ML FlexTouch Pen Inject 24 Units into the skin daily. 09/04/22   Shamleffer, Melanie Crazier, MD  insulin lispro (HUMALOG KWIKPEN) 100 UNIT/ML KwikPen 40 units max daily dose 09/04/22   Shamleffer, Melanie Crazier, MD  Insulin Pen Needle 31G X 5 MM MISC 1 Device by Does not apply route 4 (four) times daily. 08/25/22   Shamleffer, Melanie Crazier, MD  levocetirizine (XYZAL) 5  MG tablet TAKE ONE TABLET BY MOUTH EVERY EVENING 11/06/22   Garnet Sierras, DO  lisinopril (ZESTRIL) 5 MG tablet Take 1 tablet (5 mg total) by mouth daily. 09/04/22   Shamleffer, Melanie Crazier, MD  loratadine (CLARITIN) 10 MG tablet Take 10 mg by mouth daily. 05/12/22   [provider]  montelukast (SINGULAIR) 10 MG tablet Take 10 mg by mouth at bedtime. Reported on 02/16/2016 04/13/15   [provider]  Olopatadine-Mometasone Rennie Plowman) [phone] MCG/ACT SUSP Place 1-2 sprays into the nose in the morning and at bedtime. 05/12/22   Garnet Sierras, DO  omeprazole (PRILOSEC) 40 MG capsule Take 40 mg by mouth 2 (two) times daily. 05/12/22   [provider]  ondansetron (ZOFRAN ODT) 4 MG disintegrating tablet Take 1 tablet (4 mg total) by mouth every 8 (eight) hours as needed for nausea or vomiting. 07/09/20   Maudie Flakes, MD  ondansetron [facility]) 4 MG tablet Take by mouth. 10/20/21   [provider]  oxyCODONE (OXY IR/ROXICODONE) 5 MG immediate release tablet Take 5 mg by mouth daily. 02/01/22   [provider]  pioglitazone (ACTOS) 30 MG tablet Take 1 tablet (30 mg total) by mouth daily. 09/04/22   Shamleffer, Melanie Crazier, MD  polyethylene glycol-electrolytes (NULYTELY) 420 g solution Take by mouth as directed. 05/12/22   [provider]  potassium chloride SA (KLOR-CON M) 20 MEQ tablet Take 1 tablet (20 mEq total) by mouth daily. 08/29/22   Shamleffer, Melanie Crazier, MD  pregabalin (LYRICA) 150 MG capsule Take 150 mg by mouth daily. 02/15/22   [provider]  rivaroxaban (XARELTO) 20 MG TABS tablet Take 20 mg by mouth at bedtime.    [provider]  SYMBICORT 160-4.5 MCG/ACT inhaler INHALE TWO PUFFS BY MOUTH INTO LUNGS in THE morning AND AT bedtime 09/14/22   Garnet Sierras, DO  topiramate (TOPAMAX) 25 MG tablet For next pillpack in March, please dispense Topiramate '25mg'$ : take 2 tablets every night 02/08/22   Cameron Sprang, MD  topiramate (TOPAMAX)  25 MG tablet Take by mouth.    [provider]      Allergies    Silver, Canagliflozin, Codeine, Darvocet [propoxyphene n-acetaminophen], Glipizide, Hydrocodone, Hydrocodone-acetaminophen, Lantus [insulin glargine], Liraglutide, Metformin, Metformin hcl, Propoxyphene, Hydrocodone-acetaminophen, Latex, Tape, and Tramadol    Review of Systems   Review of Systems  Musculoskeletal:  Positive for back pain.  All other systems reviewed and are negative.   Physical Exam Updated Vital Signs BP (!) 132/90 (BP Location: Right Arm)   Pulse 82   Temp 98.2 F (36.8 C) (Oral)   Resp 18   Ht 5' (1.524 m)   Wt 74.8 kg   LMP 12/19/2011   SpO2 100%   BMI 32.22 kg/m  Physical Exam Vitals and nursing note reviewed.  Constitutional:      General: She is not in acute distress.    Appearance: Normal appearance.  HENT:     Head:  Normocephalic and atraumatic.  Eyes:     General:        Right eye: No discharge.        Left eye: No discharge.  Cardiovascular:     Rate and Rhythm: Normal rate and regular rhythm.  Pulmonary:     Effort: Pulmonary effort is normal. No respiratory distress.  Musculoskeletal:        General: No deformity.     Comments: Decreased strength 3/5 of right lower extremity to flexion, extension at hip secondary to pain, intact strength 5/5 of contralateral extremity.  Patient with some lumbar paraspinous muscle tenderness, no midline tenderness throughout.  No redness, swelling over top any of the spinal segments.  Moves arms spontaneously with intact strength 5/5.  Skin:    General: Skin is warm and dry.  Neurological:     Mental Status: She is alert and oriented to person, place, and time.  Psychiatric:        Mood and Affect: Mood normal.        Behavior: Behavior normal.     ED Results / Procedures / Treatments   Labs (all labs ordered are listed, but only abnormal results are displayed) Labs Reviewed  URINALYSIS, ROUTINE W REFLEX MICROSCOPIC     EKG None  Radiology No results found.  Procedures Procedures    Medications Ordered in ED Medications  oxyCODONE (Oxy IR/ROXICODONE) immediate release tablet 5 mg (5 mg Oral Given 01/09/23 1401)    ED Course/ Medical Decision Making/ A&P                             Medical Decision Making Amount and/or Complexity of Data Reviewed Labs: ordered.  Risk Prescription drug management.   Patient with back pain.  My emergent differential diagnosis includes slipped disc, compression fracture, spondylolisthesis, less clinical concern for epidural abscess or osteomyelitis based on patient history.  Based on presentation most suspicious for musculoskeletal back pain, sciatica, versus other lumbar strain, gluteus medius syndrome versus other.  No neurological deficits. Patient is ambulatory. No warning symptoms of back pain including: fecal incontinence, urinary retention or overflow incontinence, night sweats, waking from sleep with back pain, unexplained fevers or weight loss, h/o cancer, IVDU, recent trauma. No concern for cauda equina, epidural abscess, or other serious cause of back pain.  I independently interpreted a UA which is negative for any signs of infection.  Given this work-up, evaluation, physical exam I do not believe that radiographic imaging is indicated at this time.  Conservative measures such as rest, ice/heat, ibuprofen, Tylenol, and continue home muscle relaxant, narcotic pain medication, will prescribe lidocaine patches, with orthopedic follow-up if no improvement with conservative management.  Extensive return precautions given, patient discharged in stable condition at this time.  Final Clinical Impression(s) / ED Diagnoses Final diagnoses:  Sciatica of right side  Right leg pain    Rx / DC Orders ED Discharge Orders          Ordered    lidocaine (LIDODERM) 5 %  Every 24 hours        01/09/23 1420              Taeya Theall, [place] H, PA-C 01/09/23  1525    Audley Hose, MD 01/12/23 1005

## 2023-02-26 ENCOUNTER — Encounter: Payer: Self-pay | Admitting: Internal Medicine

## 2023-02-26 ENCOUNTER — Ambulatory Visit (INDEPENDENT_AMBULATORY_CARE_PROVIDER_SITE_OTHER): Payer: 59 | Admitting: Internal Medicine

## 2023-02-26 VITALS — BP 126/86 | HR 75 | Ht 60.0 in | Wt 178.0 lb

## 2023-02-26 DIAGNOSIS — E1165 Type 2 diabetes mellitus with hyperglycemia: Secondary | ICD-10-CM | POA: Diagnosis not present

## 2023-02-26 DIAGNOSIS — E1140 Type 2 diabetes mellitus with diabetic neuropathy, unspecified: Secondary | ICD-10-CM | POA: Diagnosis not present

## 2023-02-26 DIAGNOSIS — E785 Hyperlipidemia, unspecified: Secondary | ICD-10-CM

## 2023-02-26 DIAGNOSIS — Z794 Long term (current) use of insulin: Secondary | ICD-10-CM

## 2023-02-26 LAB — POCT GLYCOSYLATED HEMOGLOBIN (HGB A1C): Hemoglobin A1C: 7.7 % — AB (ref 4.0–5.6)

## 2023-02-26 MED ORDER — TRESIBA FLEXTOUCH 100 UNIT/ML ~~LOC~~ SOPN: 24.0000 [IU] | PEN_INJECTOR | Freq: Every day | SUBCUTANEOUS | 3 refills | Status: DC

## 2023-02-26 MED ORDER — TRULICITY 4.5 MG/0.5ML ~~LOC~~ SOAJ: 4.5000 mg | SUBCUTANEOUS | 3 refills | Status: DC

## 2023-02-26 MED ORDER — PIOGLITAZONE HCL 45 MG PO TABS: 45.0000 mg | ORAL_TABLET | Freq: Every day | ORAL | 3 refills | Status: DC

## 2023-02-26 NOTE — Progress Notes (Signed)
Name: Makayla Frazier  Age/ Sex: 52 y.o., female   MRN/ DOB: [id number], 10/08/1971     PCP: Elwyn Reach, MD   Reason for Endocrinology Evaluation: Type 2 Diabetes Mellitus  Initial Endocrine Consultative Visit:  12/25/2019    PATIENT IDENTIFIER: [name] is a 52 y.o. female with a past medical history of T2DM, HTN and seizure disorder . The patient has followed with Endocrinology clinic since 12/25/2019 for consultative assistance with management of her diabetes.  DIABETIC HISTORY:  Makayla Frazier was diagnosed with T2DM 2009.  She was initially on glipizide and Metformin, but developed diarrhea.  She is intolerant to Victoza due to rash.  Invokana because genital infections. Her hemoglobin A1c has ranged from  7.0% in 2011, peaking at 11.3% in 3013.   On her initial visit to our clinic she had an A1c of [id number], she was on Trulicity only.  Glipizide was added  Glipizide switched to basal insulin in 03/2020 due to worsening hyperglycemia.    Pioglitazone started 05/2021 Started novolog 12/2021  SUBJECTIVE:   During the last visit (08/25/2022): A1c 7.8%   Today (02/26/2023): Makayla Frazier is here for a follow up on diabetes management.  She checked her blood sugars multiple times a day. The patient is not known to have hypoglycemic episodes since the last clinic visit.    Follows with Makayla Frazier , last visit 05/23/2022 She is on disability  She had occasional diarrhea that she attributes to Abx use  She continues with intermittent sciatic issues      HOME DIABETES REGIMEN:  Tresiba 24 units daily  Trulicity 3 mg weekly (Fridays)  Pioglitazone 30 mg daily  CF: Humalog  (BG-120/25)    Statin: Yes ACE-I/ARB: Yes     CONTINUOUS GLUCOSE MONITORING RECORD INTERPRETATION    Dates of Recording:3/5-3/18/2024  Sensor description:dexcom   Results statistics:   CGM use % of time 79  Average and SD 212/42  Time in range 24 %  % Time Above 180 58  % Time  above 250 18  % Time Below target 0     Glycemic patterns summary: BG's trend down at night but high during the day   Hyperglycemic episodes  postprandial  Hypoglycemic episodes occurred n/a  Overnight periods:trends down       DIABETIC COMPLICATIONS: Microvascular complications:  Neuropathy Denies: CKD , retinopathy Last eye exam: Completed 2022   Macrovascular complications:  TIA Denies: CAD, PVD, CVA  HISTORY:  Past Medical History:  Past Medical History:  Diagnosis Date   Anemia    Arthritis    Left leg   Asthma    BV (bacterial vaginosis) 0000000   Complication of anesthesia    Seizures in PACU after surgery 3/15   DM (diabetes mellitus) ([place])    diagnosed 2009   DVT (deep venous thrombosis) ([place]) 2011   pt had IUD; also 05/2012   Dysfunctional uterine bleeding    Seen by Dr. Leo Grosser, GYN; pending hysterectomy as of 07/2012   H/O hypercoagulable state    2/2 contraception   Headache(784.0)    migraines   Herpes simplex without mention of complication 0000000   HSV-2   HLD (hyperlipidemia)    Hypertension    Labial lesion 2013   Major depression    Hx suicide attempt in 2009, [facility] admissions   Mastodynia    Neuromuscular disorder ([employer])    neuropathy   Neuropathy    Obesity    Oligomenorrhea 2011  PE (pulmonary embolism)    2009 - on oral contraception; multiple   Post - coital bleeding 2012   Seizures ([place]) 2015   last episode May 2015   Sleep apnea    Status post placement of implantable loop recorder    Stroke ([place])    TIA 2013   Syncopal episodes    unknown etiology   Thyroid disease    hypothyroidism (pt denies)   Vaginal Pap smear, abnormal    Venous insufficiency    Vitamin D deficiency    unknown to pt.   Past Surgical History:  Past Surgical History:  Procedure Laterality Date   BILATERAL SALPINGECTOMY  12/18/2012   Procedure: BILATERAL SALPINGECTOMY;  Surgeon: Eldred Manges, MD;  Location: [place] [facility];  Service: Gynecology;   Laterality: Bilateral;   BLADDER SUSPENSION N/A 02/13/2014   Procedure: TRANSVAGINAL TAPE (TVT) PROCEDURE;  Surgeon: Delice Lesch, MD;  Location: [place] [facility];  Service: Gynecology;  Laterality: N/A;   BREAST REDUCTION SURGERY     Dr. Eugene Garnet Drummond DFT     Implantable Loop recorder; no arrhythmias associated with synocpal spells; loop explanted 07-2013   CHOLECYSTECTOMY     DILATATION & CURRETTAGE/HYSTEROSCOPY WITH RESECTOCOPE  2007 & 2013   ENDOMETRIAL BIOPSY  2011   IUD REMOVAL  12/18/2012   Procedure: INTRAUTERINE DEVICE (IUD) REMOVAL;  Surgeon: Eldred Manges, MD;  Location: [place] [facility];  Service: Gynecology;  Laterality: N/A;  Removed during prep by E. Powell PA   LAPAROSCOPIC ASSISTED VAGINAL HYSTERECTOMY  12/18/2012   Procedure: LAPAROSCOPIC ASSISTED VAGINAL HYSTERECTOMY;  Surgeon: Eldred Manges, MD;  Location: [place] [facility];  Service: Gynecology;  Laterality: N/A;   LOOP RECORDER EXPLANT N/A 07/17/2013   Procedure: LOOP RECORDER EXPLANT;  Surgeon: Thompson Grayer, MD;  Location[facility];  Service: Cardiovascular;  Laterality: N/A;   TRANSVAGINAL TAPE (TVT) REMOVAL N/A 04/28/2014   Procedure: Removal of suburethral mesh;  Surgeon: Delice Lesch, MD;  Location: [place] [facility];  Service: Gynecology;  Laterality: N/A;   WISDOM TOOTH EXTRACTION     Social History:  reports that she has never smoked. She has never used smokeless tobacco. She reports that she does not currently use alcohol. She reports that she does not use drugs. Family History:  Family History  Problem Relation Age of Onset   Melanoma Father 87   Diabetes Father    Hypertension Father    Kidney disease Father    Ulcerative colitis Mother 67   Diabetes Mother    Hypertension Mother    Breast cancer Paternal Grandmother    Cancer Paternal Grandmother    Diabetes type II Maternal Grandmother        deceased 37   Diabetes Maternal Grandmother    Pulmonary embolism Paternal Grandfather     Stroke Maternal Grandfather      HOME MEDICATIONS: Allergies as of 02/26/2023       Reactions   Silver Other (See Comments), Rash   Canagliflozin Other (See Comments)   Codeine Nausea And Vomiting   Pt takes Percocet without problems   Darvocet [propoxyphene N-acetaminophen] Nausea And Vomiting   Glipizide    Diarrhea and abdominal pain    Hydrocodone    Other reaction(s): N/V   Hydrocodone-acetaminophen Nausea And Vomiting   Lantus [insulin Glargine]    Burning at injection site    Liraglutide    Other reaction(s): upset stomach   Metformin Diarrhea   Metformin Hcl  Other reaction(s): diarrhea   Propoxyphene    Other reaction(s): nausea   Hydrocodone-acetaminophen Nausea And Vomiting, Other (See Comments)   Vomiting   Latex Rash   Tape Itching, Rash   Tramadol Nausea Only        Medication List        Accurate as of February 26, 2023 11:37 AM. If you have any questions, ask your nurse or doctor.          acetaminophen 500 MG tablet Commonly known as: TYLENOL Take 1,000 mg by mouth every 6 (six) hours as needed for moderate pain.   albuterol 108 (90 Base) MCG/ACT inhaler Commonly known as: VENTOLIN HFA Inhale 2 puffs into the lungs every 6 (six) hours as needed for wheezing or shortness of breath.   amitriptyline 25 MG tablet Commonly known as: ELAVIL 1 tablet at bedtime.   amLODipine 5 MG tablet Commonly known as: NORVASC Take 5 mg by mouth daily.   atorvastatin 10 MG tablet Commonly known as: LIPITOR Take 10 mg by mouth at bedtime.   Azelastine HCl 137 MCG/SPRAY Soln SMARTSIG:1-2 Spray(s) Both Nares Twice Daily PRN   azithromycin 250 MG tablet Commonly known as: ZITHROMAX Take 1 tablet (250 mg total) by mouth daily. Take first 2 tablets together, then 1 every day until finished.   benzonatate 100 MG capsule Commonly known as: TESSALON Take 1 capsule (100 mg total) by mouth 3 (three) times daily as needed for cough.   budesonide-formoterol  160-4.5 MCG/ACT inhaler Commonly known as: Symbicort Inhale 2 puffs into the lungs in the morning and at bedtime. with spacer and rinse mouth afterwards.   Symbicort 160-4.5 MCG/ACT inhaler Generic drug: budesonide-formoterol INHALE TWO PUFFS BY MOUTH INTO LUNGS in THE morning AND AT bedtime   cyclobenzaprine 10 MG tablet Commonly known as: FLEXERIL cyclobenzaprine 10 mg tablet   EPINEPHrine 0.3 mg/0.3 mL Soaj injection Commonly known as: EPI-PEN See admin instructions.   EPINEPHrine 0.3 mg/0.3 mL Soaj injection Commonly known as: EPI-PEN Inject 0.3 mg into the muscle as needed for anaphylaxis.   Fluocinolone Acetonide Body 0.01 % Oil fluocinolone 0.01 % scalp oil and shower cap  APPLY SMALL AMOUNT TOPICALLY TO THE AFFECTED AREA 2 TO 3 TIMES WEEKLY AS DIRECTED   Fluocinolone Acetonide Scalp 0.01 % Oil   insulin lispro 100 UNIT/ML KwikPen Commonly known as: HumaLOG KwikPen 40 units max daily dose   Insulin Pen Needle 31G X 5 MM Misc 1 Device by Does not apply route 4 (four) times daily.   levocetirizine 5 MG tablet Commonly known as: XYZAL TAKE ONE TABLET BY MOUTH EVERY EVENING   lidocaine 5 % Commonly known as: Lidoderm Place 1 patch onto the skin daily. Remove & Discard patch within 12 hours or as directed by MD   lisinopril 5 MG tablet Commonly known as: ZESTRIL Take 1 tablet (5 mg total) by mouth daily.   loratadine 10 MG tablet Commonly known as: CLARITIN Take 10 mg by mouth daily.   montelukast 10 MG tablet Commonly known as: SINGULAIR Take 10 mg by mouth at bedtime. Reported on 02/16/2016   omeprazole 40 MG capsule Commonly known as: PRILOSEC Take 40 mg by mouth 2 (two) times daily.   ondansetron 4 MG disintegrating tablet Commonly known as: Zofran ODT Take 1 tablet (4 mg total) by mouth every 8 (eight) hours as needed for nausea or vomiting.   ondansetron 4 MG tablet Commonly known as: ZOFRAN Take by mouth.   OneTouch Verio test strip Generic  drug: glucose blood Check blood sugar 3 times daily   oxyCODONE 5 MG immediate release tablet Commonly known as: Oxy IR/ROXICODONE Take 10 mg by mouth daily.   pioglitazone 30 MG tablet Commonly known as: Actos Take 1 tablet (30 mg total) by mouth daily.   polyethylene glycol-electrolytes 420 g solution Commonly known as: NuLYTELY Take by mouth as directed.   potassium chloride SA 20 MEQ tablet Commonly known as: KLOR-CON M Take 1 tablet (20 mEq total) by mouth daily.   pregabalin 150 MG capsule Commonly known as: LYRICA Take 150 mg by mouth daily.   Ryaltris [id number] MCG/ACT Susp Generic drug: Olopatadine-Mometasone Place 1-2 sprays into the nose in the morning and at bedtime.   topiramate 25 MG tablet Commonly known as: TOPAMAX Take by mouth.   topiramate 25 MG tablet Commonly known as: TOPAMAX For next pillpack in March, please dispense Topiramate 25mg : take 2 tablets every night   Tresiba FlexTouch 100 UNIT/ML FlexTouch Pen Generic drug: insulin degludec Inject 24 Units into the skin daily.   Trulicity 3 0000000 Sopn Generic drug: Dulaglutide 3 mg by Other route once a week.   Xarelto 20 MG Tabs tablet Generic drug: rivaroxaban Take 20 mg by mouth at bedtime.         OBJECTIVE:   Vital Signs: BP 126/86 (BP Location: Right Arm, Patient Position: Sitting, Cuff Size: Large)   Pulse 75   Ht 5' (1.524 m)   Wt 178 lb (80.7 kg)   LMP 12/19/2011   SpO2 99%   BMI 34.76 kg/m   Wt Readings from Last 3 Encounters:  02/26/23 178 lb (80.7 kg)  01/09/23 165 lb (74.8 kg)  09/27/22 165 lb (74.8 kg)     Exam: General: Pt appears well and is in NAD  Lungs: Clear with good BS bilat   Heart: RRR   Extremities: No pretibial edema.   Neuro: MS is good with appropriate affect, pt is alert and Ox3   DM foot exam:12/13/2022 per podiatry    DATA REVIEWED:  Lab Results  Component Value Date   HGBA1C 7.8 (A) 08/25/2022   HGBA1C 9.4 (A) 04/21/2022   HGBA1C 9.6  (A) 12/16/2021    Latest Reference Range & Units 08/25/22 11:49  Sodium 135 - 145 mEq/L 135  Potassium 3.5 - 5.1 mEq/L 3.1 (L)  Chloride 96 - 112 mEq/L 96  CO2 19 - 32 mEq/L 30  Glucose 70 - 99 mg/dL 137 (H)  BUN 6 - 23 mg/dL 22  Creatinine 0.40 - 1.20 mg/dL 1.29 (H)  Calcium 8.4 - 10.5 mg/dL 9.4    Latest Reference Range & Units 08/25/22 11:49  GFR >60.00 mL/min 48.29 (L)    Latest Reference Range & Units 08/25/22 11:49  Total CHOL/HDL Ratio  3  Cholesterol 0 - 200 mg/dL 128  HDL Cholesterol >39.00 mg/dL 45.30  LDL (calc) 0 - 99 mg/dL 61  NonHDL  82.36  Triglycerides 0.0 - 149.0 mg/dL 106.0  VLDL 0.0 - 40.0 mg/dL 21.2     ASSESSMENT / PLAN / RECOMMENDATIONS:   Type 2 Diabetes Mellitus, with improving glycemic control, With neuropathic complications - Most recent A1c of 7.7 %. Goal A1c <7.0%.  -A1c continues to gradually decrease  - In reviewing her CGM, the pt has noted with severe hyperglycemia at night, I did recommend starting a standing dose of Humalog at suppertime, but the patient is hesitant as the injection is painful, I did advise the patient to keep the Humalog at room  temperature for about 10 minutes before injection and see if that makes a difference, at times the pain is due to cold temperature or due to additives -In the meantime we will increase the Trulicity as well as pioglitazone -If hyperglycemia persist, we will consider again starting him on a standing dose of Humalog at supper -Intolerant to Invokana -Glipizide caused diarrhea and abdominal pain -I have encouraged exercise, patient has plans with spouse to start walking more around the neighborhood    MEDICATIONS:  Continue Tresiba 24  units daily  Increase Trulicity 4.5 mg weekly  Increase pioglitazone 45  mg daily Continue correction factor :Humalog( BG-120/25) TIDQAC  EDUCATION / INSTRUCTIONS: BG monitoring instructions: Patient is instructed to check her blood sugars 3 times a day, before  meals  I reviewed the Rule of 15 for the treatment of hypoglycemia in detail with the patient. Literature supplied.   2) Diabetic complications:  Eye: Does not have known diabetic retinopathy.  Neuro/ Feet: Does have known diabetic peripheral neuropathy. Renal: Patient does not have known baseline CKD. She is on an ACEI/ARB at present.   3) Dyslipidemia :  -Lipid panel today are optimal  Medication Continue atorvastatin 10 mg daily    4) Hypokalemia/CKD :   -I had stop the chlorthalidone and increase lisinopril   Continue lisinopril 5 mg daily   F/U in 6 months     Signed electronically by: Mack Guise, MD  [facility]  [address] [place], [place] [address] Phone: [phone] FAX: [phone]   CC: Elwyn Reach, MD [address] [place] [address] Phone: [phone]  Fax: [phone]  Return to Endocrinology clinic as below: Future Appointments  Date Time Provider [place]  03/23/2023  9:30 AM Marzetta Board, DPM [facility]

## 2023-02-26 NOTE — Patient Instructions (Signed)
-  Continue Tresiba 24  units once daily  -Increase Trulicity 4.5 mg weekly  -Increase  Actos 45 mg daily  -Humalog correctional insulin:  Use the scale below to help guide you before each meal   Blood sugar before meal Number of units to inject  Less than 145 0 unit  146 -  170 1 units  171 -  195 2 units  196 -  220 3 units  221 -  245 4 units  246 -  270 5 units  271 -  295 6 units  296 -  320 7 units  321 -  345 8 units  346 - 370 9 units  371 - 395 10 units  396 - 420 11 units       -HOW TO TREAT LOW BLOOD SUGARS (Blood sugar LESS THAN 70 MG/DL) Please follow the RULE OF 15 for the treatment of hypoglycemia treatment (when your (blood sugars are less than 70 mg/dL)   STEP 1: Take 15 grams of carbohydrates when your blood sugar is low, which includes:  3-4 GLUCOSE TABS  OR 3-4 OZ OF JUICE OR REGULAR SODA OR ONE TUBE OF GLUCOSE GEL    STEP 2: RECHECK blood sugar in 15 MINUTES STEP 3: If your blood sugar is still low at the 15 minute recheck --> then, go back to STEP 1 and treat AGAIN with another 15 grams of carbohydrates.

## 2023-02-27 ENCOUNTER — Encounter: Payer: Self-pay | Admitting: Internal Medicine

## 2023-03-06 ENCOUNTER — Telehealth: Payer: Self-pay

## 2023-03-06 NOTE — Telephone Encounter (Signed)
Clinical notes faxed to [facility] through Epic per fax request.

## 2023-03-23 ENCOUNTER — Ambulatory Visit (INDEPENDENT_AMBULATORY_CARE_PROVIDER_SITE_OTHER): Payer: 59 | Admitting: Podiatry

## 2023-03-23 VITALS — BP 120/78

## 2023-03-23 DIAGNOSIS — E1165 Type 2 diabetes mellitus with hyperglycemia: Secondary | ICD-10-CM | POA: Diagnosis not present

## 2023-03-23 DIAGNOSIS — B351 Tinea unguium: Secondary | ICD-10-CM

## 2023-03-23 DIAGNOSIS — M79674 Pain in right toe(s): Secondary | ICD-10-CM

## 2023-03-23 DIAGNOSIS — S90212A Contusion of left great toe with damage to nail, initial encounter: Secondary | ICD-10-CM

## 2023-03-23 DIAGNOSIS — M79675 Pain in left toe(s): Secondary | ICD-10-CM

## 2023-03-23 NOTE — Progress Notes (Unsigned)
  Subjective:  Patient ID: Makayla Frazier, female    DOB: December 19, 1970,  MRN: [id number]  Makayla Frazier presents to clinic today for {jgcomplaint:23593}  Chief Complaint  Patient presents with   Nail Problem    [facility] BS-126 A1C-7.6 PCP-[facility] PCP VST-02/2023   New problem(s): None. {jgcomplaint:23593}  PCP is Rometta Emery, MD.  Allergies  Allergen Reactions   Silver Other (See Comments) and Rash   Canagliflozin Other (See Comments)   Codeine Nausea And Vomiting    Pt takes Percocet without problems    Darvocet [Propoxyphene N-Acetaminophen] Nausea And Vomiting   Glipizide     Diarrhea and abdominal pain    Hydrocodone     Other reaction(s): N/V   Hydrocodone-Acetaminophen Nausea And Vomiting   Lantus [Insulin Glargine]     Burning at injection site    Liraglutide     Other reaction(s): upset stomach   Metformin Diarrhea   Metformin Hcl     Other reaction(s): diarrhea   Oxycodone     Other Reaction(s): GI Intolerance   Propoxyphene     Other reaction(s): nausea   Hydrocodone-Acetaminophen Nausea And Vomiting and Other (See Comments)    Vomiting   Latex Rash   Tape Itching and Rash   Tramadol Nausea Only    Review of Systems: Negative except as noted in the HPI.  Objective: No changes noted in today's physical examination. Vitals:   03/23/23 0945  BP: 120/78   Makayla Frazier is a pleasant 52 y.o. female {jgbodyhabitus:24098} AAO x 3.  Vascular Capillary fill time to digits immediate b/l.  DP/PT pulse(s) are palpable b/l lower extremities. Pedal hair present. Lower extremity skin temperature gradient within normal limits. No pain with calf compression b/l. Trace edema noted left ankle. No cyanosis or clubbing noted.   Neurologic Protective sensation intact 5/5 intact bilaterally with 10g monofilament b/l. Vibratory sensation intact b/l. No clonus b/l. Pt has subjective symptoms of neuropathy.  Dermatologic Pedal skin is warm and supple b/l.  No open wounds  b/l lower extremities. No interdigital macerations b/l lower extremities. Toenails 1-5 b/l elongated, discolored, dystrophic, thickened, crumbly with subungual debris and tenderness to dorsal palpation.   Orthopedic: Normal muscle strength 5/5 to all lower extremity muscle groups bilaterally. Patient ambulates independent of any assistive aids. HAV with bunion deformity noted b/l LE left>right.   Assessment/Plan: 1. Pain due to onychomycosis of toenails of both feet   2. Type 2 diabetes mellitus with hyperglycemia, without long-term current use of insulin     No orders of the defined types were placed in this encounter.   None {Jgplan:23602::"-Patient/POA to call should there be question/concern in the interim."}   Return in about 3 months (around 06/22/2023).  Freddie Breech, DPM

## 2023-03-25 ENCOUNTER — Encounter: Payer: Self-pay | Admitting: Podiatry

## 2023-05-01 ENCOUNTER — Other Ambulatory Visit: Payer: Self-pay | Admitting: Internal Medicine

## 2023-05-01 ENCOUNTER — Other Ambulatory Visit: Payer: Self-pay | Admitting: Allergy

## 2023-05-07 ENCOUNTER — Other Ambulatory Visit: Payer: Self-pay | Admitting: Allergy

## 2023-05-21 ENCOUNTER — Encounter: Payer: Self-pay | Admitting: Internal Medicine

## 2023-05-22 MED ORDER — SEMAGLUTIDE (1 MG/DOSE) 4 MG/3ML ~~LOC~~ SOPN: 1.0000 mg | PEN_INJECTOR | SUBCUTANEOUS | 3 refills | Status: DC

## 2023-06-28 ENCOUNTER — Ambulatory Visit: Payer: 59

## 2023-07-04 ENCOUNTER — Encounter: Payer: Self-pay | Admitting: Cardiology

## 2023-07-04 NOTE — Telephone Encounter (Signed)
Spoke with pt who reports she has been told by 3 of her providers during appointments her BP was low.  Pt denies current CP, SOB or dizziness.  She does endorse lightheadedness/dizziness when her BP is low.  She states she has been taking medications as prescribed and PCP has been managing her BP up to this point.  She decided to begin monitoring BP at home and would like to schedule an appointment for further evaluation.  Pt last saw Dr Anne Fu in 2022.  Pt advised to continue to monitor BP, increase fluid intake and may have a salty snack if she finds her BP low.  Appointment scheduled with Dr Anne Fu on 07/18/2023.  Pt verbalizes understanding and agrees with current plan.

## 2023-07-18 ENCOUNTER — Encounter: Payer: Self-pay | Admitting: Cardiology

## 2023-07-18 ENCOUNTER — Ambulatory Visit: Payer: 59 | Attending: Cardiology | Admitting: Cardiology

## 2023-07-18 VITALS — BP 110/66 | HR 91 | Ht 60.0 in | Wt 167.8 lb

## 2023-07-18 DIAGNOSIS — R002 Palpitations: Secondary | ICD-10-CM | POA: Diagnosis not present

## 2023-07-18 DIAGNOSIS — I950 Idiopathic hypotension: Secondary | ICD-10-CM | POA: Diagnosis not present

## 2023-07-18 NOTE — Patient Instructions (Signed)
Medication Instructions:  Please discontinue your Amlodipine and Lisinopril. Continue all other medications as listed.  Please keep a blood pressure diary and send them to Dr Anne Fu either through [facility] or calling in 2 weeks.   *If you need a refill on your cardiac medications before your next appointment, please call your pharmacy*  Follow-Up: At [facility], you and your health needs are our priority.  As part of our continuing mission to provide you with exceptional heart care, we have created designated Provider Care Teams.  These Care Teams include your primary Cardiologist (physician) and Advanced Practice Providers (APPs -  Physician Assistants and Nurse Practitioners) who all work together to provide you with the care you need, when you need it.  We recommend signing up for the patient portal called "MyChart".  Sign up information is provided on this After Visit Summary.  MyChart is used to connect with patients for Virtual Visits (Telemedicine).  Patients are able to view lab/test results, encounter notes, upcoming appointments, etc.  Non-urgent messages can be sent to your provider as well.   To learn more about what you can do with MyChart, go to [email or web address].    Your next appointment:   1 year(s)  Provider:   Donato Schultz, MD

## 2023-07-18 NOTE — Progress Notes (Signed)
Cardiology Office Note:  .   Date:  07/18/2023  ID:  Makayla Frazier, DOB 1971/02/19, MRN [id number] PCP: Rometta Emery, MD  [facility] Providers Cardiologist:  Donato Schultz, MD    History of Present Illness: .   Makayla Frazier is a 52 y.o. female here for evaluation of hypotension.  Her primary doctor had stopped her blood pressure medications for about 1 week.  Her blood pressures were quite low.  She states that she slipped down in the shower felt weak almost hit the glass.  No chest pain no shortness of breath.  From prior note in 2022: She has diabetes hypertension seizure disorder followed by endocrinology.  Denies prior history of CAD.  Had TIA in 2013 on past medical history.  Also had a implantable loop recorder as well with explant in 2014 by Dr. Johney Frame.  Had a prior DVT in 2011 with IUD.   She was seen by Dr. Johney Frame in the past loop recorder from 2012 2014 that did not demonstrate an arrhythmia to explain syncope.  She was subsequently diagnosed with seizures and her symptoms improved with antiepileptics.  She had a stress echo in 2016 negative for ischemia.  Has had chest pain since 2009.  Had been on Xarelto chronically since recurrent PE in 2013.  No PE on CT in 2018.  She was previously seen in our office in 2018 after failing syncopal type episode after leaving her PCPs office.  Blood sugars have been labile.  She feels dizzy weak at times.  Thankfully, she has had extensive work-up in the past from cardiology.  Negative.   She has not really had any of the syncopal episodes since she has been on the seizure type medication.  Excellent.  She did state that while at a previous doctors visit they did hear some irregular heartbeats some skips.  1 time on her loop recorder nighttime brief benign arrhythmia.  No atrial fibrillation.   Her primary physician also asked if we would be comfortable managing her Xarelto.  This is fine.  I agree with lifelong Xarelto given her  recurrent PE.   Studies Reviewed: Marland Kitchen   EKG Interpretation Date/Time:  Wednesday July 18 2023 16:32:37 EDT Ventricular Rate:  91 PR Interval:  132 QRS Duration:  60 QT Interval:  348 QTC Calculation: 428 R Axis:   62  Text Interpretation: Normal sinus rhythm Low voltage QRS Nonspecific T wave abnormality When compared with ECG of 09-Jul-2020 09:13, No significant change since last tracing Confirmed by Donato Schultz ([id number]) on 07/18/2023 4:46:52 PM     Risk Assessment/Calculations:            Physical Exam:   VS:  BP 110/66   Pulse 91   Ht 5' (1.524 m)   Wt 167 lb 12.8 oz (76.1 kg)   LMP 12/19/2011   SpO2 96%   BMI 32.77 kg/m    Wt Readings from Last 3 Encounters:  07/18/23 167 lb 12.8 oz (76.1 kg)  02/26/23 178 lb (80.7 kg)  01/09/23 165 lb (74.8 kg)    GEN: Well nourished, well developed in no acute distress NECK: No JVD; No carotid bruits CARDIAC: RRR, no murmurs, rubs, gallops RESPIRATORY:  Clear to auscultation without rales, wheezing or rhonchi  ABDOMEN: Soft, non-tender, non-distended EXTREMITIES:  No edema; No deformity   ASSESSMENT AND PLAN: .    Hypotension -- We will go ahead and stop her amlodipine 5 mg and stop her lisinopril 5 mg.  She will record her blood pressure readings for me.  In about 2 weeks send me some readings over MyChart messenger.  If we need to, we can always add back starting with lisinopril.  Hyperlipidemia - Atorvastatin 10 mg once a day LDL 61.  Excellent.  Diabetes mellitus type 2 - Hemoglobin A1c 7.7.  On Ozempic.  Could help with weight loss as well.  History of PE - On continuous Xarelto 20 mg a day.  Hemoglobin 12.0 creatinine 1.29.      Dispo: She will message [place] back in 2 weeks.  Otherwise 1 year follow-up.  Signed, Donato Schultz, MD

## 2023-08-07 ENCOUNTER — Ambulatory Visit (INDEPENDENT_AMBULATORY_CARE_PROVIDER_SITE_OTHER): Payer: 59 | Admitting: Podiatry

## 2023-08-07 ENCOUNTER — Encounter: Payer: Self-pay | Admitting: Podiatry

## 2023-08-07 VITALS — BP 93/63 | HR 74

## 2023-08-07 DIAGNOSIS — M79675 Pain in left toe(s): Secondary | ICD-10-CM | POA: Diagnosis not present

## 2023-08-07 DIAGNOSIS — B351 Tinea unguium: Secondary | ICD-10-CM

## 2023-08-07 DIAGNOSIS — M79674 Pain in right toe(s): Secondary | ICD-10-CM

## 2023-08-07 DIAGNOSIS — M47818 Spondylosis without myelopathy or radiculopathy, sacral and sacrococcygeal region: Secondary | ICD-10-CM | POA: Insufficient documentation

## 2023-08-07 DIAGNOSIS — E1165 Type 2 diabetes mellitus with hyperglycemia: Secondary | ICD-10-CM

## 2023-08-07 NOTE — Progress Notes (Signed)
  Subjective:  Patient ID: Makayla Frazier, female    DOB: [birth year],  MRN: [id number]  Makayla Frazier presents to clinic today for preventative diabetic foot care and painful elongated mycotic toenails 1-5 bilaterally which are tender when wearing enclosed shoe gear. Pain is relieved with periodic professional debridement.  Chief Complaint  Patient presents with   Nail Problem    DFC/ A1C 9   New problem(s): None.   PCP is Rometta Emery, MD.  Allergies  Allergen Reactions   Silver Other (See Comments) and Rash   Canagliflozin Other (See Comments)   Codeine Nausea And Vomiting    Pt takes Percocet without problems    Darvocet [Propoxyphene N-Acetaminophen] Nausea And Vomiting   Glipizide     Diarrhea and abdominal pain    Hydrocodone     Other reaction(s): N/V   Hydrocodone-Acetaminophen Nausea And Vomiting   Lantus [Insulin Glargine]     Burning at injection site    Liraglutide     Other reaction(s): upset stomach   Metformin Diarrhea   Metformin Hcl     Other reaction(s): diarrhea   Oxycodone     Other Reaction(s): GI Intolerance   Propoxyphene     Other reaction(s): nausea   Hydrocodone-Acetaminophen Nausea And Vomiting and Other (See Comments)    Vomiting   Latex Rash   Tape Itching and Rash   Tramadol Nausea Only    Review of Systems: Negative except as noted in the HPI.  Objective: No changes noted in today's physical examination. Vitals:   08/07/23 0936  BP: 93/63  Pulse: 74   Makayla Frazier is a pleasant 52 y.o. female WD, WN in NAD. AAO x 3.  Vascular Examination: Capillary refill time immediate b/l. Vascular status intact b/l with palpable pedal pulses. Pedal hair present b/l. No pain with calf compression b/l. Skin temperature gradient WNL b/l. No cyanosis or clubbing b/l. No ischemia or gangrene noted b/l.   Neurological Examination: Sensation grossly intact b/l with 10 gram monofilament. Vibratory sensation intact b/l. Pt has subjective  symptoms of neuropathy.  Dermatological Examination: Pedal skin with normal turgor, texture and tone b/l.  No open wounds. No interdigital macerations.   Toenails 1-5 b/l thick, discolored, elongated with subungual debris and pain on dorsal palpation.   Subungual hematoma of left great toe continues to grow out. Nail plate remains adhered.  Musculoskeletal Examination: Muscle strength 5/5 to all lower extremity muscle groups bilaterally. HAV with bunion deformity noted b/l LE.  Radiographs: None  Last A1c:      Latest Ref Rng & Units 02/26/2023   11:39 AM 08/25/2022   11:17 AM  Hemoglobin A1C  Hemoglobin-A1c 4.0 - 5.6 % 7.7  7.8    Assessment/Plan: 1. Pain due to onychomycosis of toenails of both feet   2. Type 2 diabetes mellitus with hyperglycemia, without long-term current use of insulin (HCC)     -Consent given for treatment as described below: -Examined patient. -Continue foot and shoe inspections daily. Monitor blood glucose per PCP/Endocrinologist's recommendations. -Mycotic toenails 1-5 bilaterally were debrided in length and girth with sterile nail nippers and dremel without incident. -Patient/POA to call should there be question/concern in the interim.   Return in about 3 months (around 11/07/2023).  Freddie Breech, DPM

## 2023-08-15 ENCOUNTER — Telehealth: Payer: Self-pay

## 2023-08-15 ENCOUNTER — Other Ambulatory Visit: Payer: Self-pay

## 2023-08-15 MED ORDER — LISINOPRIL 5 MG PO TABS: 5.0000 mg | ORAL_TABLET | Freq: Every day | ORAL | 3 refills | Status: AC

## 2023-08-15 NOTE — Telephone Encounter (Signed)
Last office note states to continue Lisinopril but I can't find it on the active med list. Okay to fill

## 2023-08-30 ENCOUNTER — Ambulatory Visit (INDEPENDENT_AMBULATORY_CARE_PROVIDER_SITE_OTHER): Payer: 59 | Admitting: Internal Medicine

## 2023-08-30 ENCOUNTER — Encounter: Payer: Self-pay | Admitting: Internal Medicine

## 2023-08-30 VITALS — BP 120/80 | HR 104 | Ht 60.0 in | Wt 166.4 lb

## 2023-08-30 DIAGNOSIS — E1165 Type 2 diabetes mellitus with hyperglycemia: Secondary | ICD-10-CM

## 2023-08-30 DIAGNOSIS — E559 Vitamin D deficiency, unspecified: Secondary | ICD-10-CM

## 2023-08-30 DIAGNOSIS — I959 Hypotension, unspecified: Secondary | ICD-10-CM | POA: Diagnosis not present

## 2023-08-30 DIAGNOSIS — Z7985 Long-term (current) use of injectable non-insulin antidiabetic drugs: Secondary | ICD-10-CM

## 2023-08-30 DIAGNOSIS — Z7984 Long term (current) use of oral hypoglycemic drugs: Secondary | ICD-10-CM

## 2023-08-30 DIAGNOSIS — Z794 Long term (current) use of insulin: Secondary | ICD-10-CM

## 2023-08-30 LAB — BASIC METABOLIC PANEL
BUN: 14 mg/dL (ref 6–23)
CO2: 24 mEq/L (ref 19–32)
Calcium: 9.7 mg/dL (ref 8.4–10.5)
Chloride: 104 mEq/L (ref 96–112)
Creatinine, Ser: 1.13 mg/dL (ref 0.40–1.20)
GFR: 56.21 mL/min — ABNORMAL LOW (ref >60.00)
Glucose, Bld: 170 mg/dL — ABNORMAL HIGH (ref 70–99)
Potassium: 3.6 mEq/L (ref 3.5–5.1)
Sodium: 140 mEq/L (ref 135–145)

## 2023-08-30 LAB — POCT GLYCOSYLATED HEMOGLOBIN (HGB A1C): Hemoglobin A1C: 7.6 % — AB (ref 4.0–5.6)

## 2023-08-30 LAB — LIPID PANEL
Cholesterol: 164 mg/dL (ref 0–200)
HDL: 53.9 mg/dL (ref >39.00)
LDL Cholesterol: 93 mg/dL (ref 0–99)
NonHDL: 110.08
Total CHOL/HDL Ratio: 3
Triglycerides: 85 mg/dL (ref 0.0–149.0)
VLDL: 17 mg/dL (ref 0.0–40.0)

## 2023-08-30 LAB — MICROALBUMIN / CREATININE URINE RATIO
Creatinine,U: 584.2 mg/dL
Microalb Creat Ratio: 3.4 mg/g (ref 0.0–30.0)
Microalb, Ur: 19.9 mg/dL — ABNORMAL HIGH (ref 0.0–1.9)

## 2023-08-30 LAB — TSH: TSH: 4.57 u[IU]/mL (ref 0.35–5.50)

## 2023-08-30 LAB — T4, FREE: Free T4: 1.02 ng/dL (ref 0.60–1.60)

## 2023-08-30 LAB — VITAMIN D 25 HYDROXY (VIT D DEFICIENCY, FRACTURES): VITD: 9.44 ng/mL — ABNORMAL LOW (ref 30.00–100.00)

## 2023-08-30 MED ORDER — PIOGLITAZONE HCL 45 MG PO TABS: 45.0000 mg | ORAL_TABLET | Freq: Every day | ORAL | 3 refills | Status: DC

## 2023-08-30 MED ORDER — TRESIBA FLEXTOUCH 100 UNIT/ML ~~LOC~~ SOPN: 20.0000 [IU] | PEN_INJECTOR | Freq: Every day | SUBCUTANEOUS | 3 refills | Status: DC

## 2023-08-30 MED ORDER — INSULIN PEN NEEDLE 32G X 4 MM MISC: 1.0000 | Freq: Four times a day (QID) | 3 refills | Status: AC

## 2023-08-30 MED ORDER — SEMAGLUTIDE (2 MG/DOSE) 8 MG/3ML ~~LOC~~ SOPN: 2.0000 mg | PEN_INJECTOR | SUBCUTANEOUS | 3 refills | Status: AC

## 2023-08-30 NOTE — Progress Notes (Signed)
Name: Makayla Frazier  Age/ Sex: 52 y.o., female   MRN/ DOB: [id number], [birth year]     PCP: Rometta Emery, MD   Reason for Endocrinology Evaluation: Type 2 Diabetes Mellitus  Initial Endocrine Consultative Visit:  12/25/2019    PATIENT IDENTIFIER: Makayla Frazier is a 52 y.o. female with a past medical history of T2DM, HTN and seizure disorder . The patient has followed with Endocrinology clinic since 12/25/2019 for consultative assistance with management of her diabetes.  DIABETIC HISTORY:  Makayla Frazier was diagnosed with T2DM 2009.  She was initially on glipizide and Metformin, but developed diarrhea.  She is intolerant to Victoza due to rash.  Invokana because genital infections. Her hemoglobin A1c has ranged from  7.0% in 2011, peaking at 11.3% in 3013.   On her initial visit to our clinic she had an A1c of 9.9%, she was on Trulicity only.  Glipizide was added  Glipizide switched to basal insulin in 03/2020 due to worsening hyperglycemia.    Pioglitazone started 05/2021 Started novolog 12/2021  SUBJECTIVE:   During the last visit (08/25/2022): A1c 7.7%   Today (08/30/2023): Makayla Frazier is here for a follow up on diabetes management.  She checked her blood sugars multiple times a day. The patient is not clear on hypoglycemia    Follows with [facility] , last visit 08/15/2023 She continues to follow-up with cardiology for hypertension, amlodipine was stopped 07/2023 as well as lisinopril.  But she is back on lisinopril at this time   She is on disability   Denies vomiting but has occasional nausea  Has noted constipation which is chronic  She does receive lumbar Glucocorticoid injections , she continues    HOME DIABETES REGIMEN:  Tresiba 24 units daily  Ozempic 1 mg weekly Pioglitazone 45 mg daily  CF: Humalog  (BG-120/25)    Statin: Yes ACE-I/ARB: Yes     CONTINUOUS GLUCOSE MONITORING RECORD INTERPRETATION    Dates of Recording:9/6-9/19/2024  Sensor  description:dexcom   Results statistics:   CGM use % of time 57  Average and SD 169/36  Time in range 65%  % Time Above 180 32  % Time above 250 3  % Time Below target 0     Glycemic patterns summary: BGs are at the upper limit of normal overnight and fluctuate during the day Hyperglycemic episodes  postprandial  Hypoglycemic episodes occurred n/a  Overnight periods: Variable      DIABETIC COMPLICATIONS: Microvascular complications:  Neuropathy Denies: CKD , retinopathy Last eye exam: Completed 2022   Macrovascular complications:  TIA Denies: CAD, PVD, CVA  HISTORY:  Past Medical History:  Past Medical History:  Diagnosis Date   Anemia    Arthritis    Left leg   Asthma    BV (bacterial vaginosis) 2012   Complication of anesthesia    Seizures in PACU after surgery 3/15   DM (diabetes mellitus) (HCC)    diagnosed 2009   DVT (deep venous thrombosis) (HCC) 2011   pt had IUD; also 05/2012   Dysfunctional uterine bleeding    Seen by Dr. Pennie Rushing, GYN; pending hysterectomy as of 07/2012   H/O hypercoagulable state    2/2 contraception   Headache(784.0)    migraines   Herpes simplex without mention of complication 06/2011   HSV-2   HLD (hyperlipidemia)    Hypertension    Labial lesion 2013   Major depression    Hx suicide attempt in 2009, [facility] admissions  Mastodynia    Neuromuscular disorder (HCC)    neuropathy   Neuropathy    Obesity    Oligomenorrhea 2011   PE (pulmonary embolism)    2009 - on oral contraception; multiple   Post - coital bleeding 2012   Seizures (HCC) 2015   last episode May 2015   Sleep apnea    Status post placement of implantable loop recorder    Stroke (HCC)    TIA 2013   Syncopal episodes    unknown etiology   Thyroid disease    hypothyroidism (pt denies)   Vaginal Pap smear, abnormal    Venous insufficiency    Vitamin D deficiency    unknown to pt.   Past Surgical History:  Past Surgical History:  Procedure  Laterality Date   BILATERAL SALPINGECTOMY  12/18/2012   Procedure: BILATERAL SALPINGECTOMY;  Surgeon: Hal Morales, MD;  Location: [facility];  Service: Gynecology;  Laterality: Bilateral;   BLADDER SUSPENSION N/A 02/13/2014   Procedure: TRANSVAGINAL TAPE (TVT) PROCEDURE;  Surgeon: Purcell Nails, MD;  Location: [facility];  Service: Gynecology;  Laterality: N/A;   BREAST REDUCTION SURGERY     Dr. Kathie Dike [facility] 2011   CARDIAC ELECTROPHYSIOLOGY STUDY & DFT     Implantable Loop recorder; no arrhythmias associated with synocpal spells; loop explanted 07-2013   CHOLECYSTECTOMY     DILATATION & CURRETTAGE/HYSTEROSCOPY WITH RESECTOCOPE  2007 & 2013   ENDOMETRIAL BIOPSY  2011   IUD REMOVAL  12/18/2012   Procedure: INTRAUTERINE DEVICE (IUD) REMOVAL;  Surgeon: Hal Morales, MD;  Location: [facility];  Service: Gynecology;  Laterality: N/A;  Removed during prep by E. Powell PA   LAPAROSCOPIC ASSISTED VAGINAL HYSTERECTOMY  12/18/2012   Procedure: LAPAROSCOPIC ASSISTED VAGINAL HYSTERECTOMY;  Surgeon: Hal Morales, MD;  Location: [facility];  Service: Gynecology;  Laterality: N/A;   LOOP RECORDER EXPLANT N/A 07/17/2013   Procedure: LOOP RECORDER EXPLANT;  Surgeon: Hillis Range, MD;  Location[facility] LAB;  Service: Cardiovascular;  Laterality: N/A;   TRANSVAGINAL TAPE (TVT) REMOVAL N/A 04/28/2014   Procedure: Removal of suburethral mesh;  Surgeon: Purcell Nails, MD;  Location: [facility];  Service: Gynecology;  Laterality: N/A;   WISDOM TOOTH EXTRACTION     Social History:  reports that she has never smoked. She has never used smokeless tobacco. She reports that she does not currently use alcohol. She reports that she does not use drugs. Family History:  Family History  Problem Relation Age of Onset   Melanoma Father 17   Diabetes Father    Hypertension Father    Kidney disease Father    Ulcerative colitis Mother 79   Diabetes Mother    Hypertension Mother    Breast cancer Paternal Grandmother    Cancer  Paternal Grandmother    Diabetes type II Maternal Grandmother        deceased 65   Diabetes Maternal Grandmother    Pulmonary embolism Paternal Grandfather    Stroke Maternal Grandfather      HOME MEDICATIONS: Allergies as of 08/30/2023       Reactions   Silver Other (See Comments), Rash   Canagliflozin Other (See Comments)   Codeine Nausea And Vomiting   Pt takes Percocet without problems   Darvocet [propoxyphene N-acetaminophen] Nausea And Vomiting   Glipizide    Diarrhea and abdominal pain    Hydrocodone    Other reaction(s): N/V   Hydrocodone-acetaminophen Nausea And Vomiting   Lantus [insulin Glargine]  Burning at injection site    Liraglutide    Other reaction(s): upset stomach   Metformin Diarrhea   Metformin Hcl    Other reaction(s): diarrhea   Oxycodone    Other Reaction(s): GI Intolerance   Propoxyphene    Other reaction(s): nausea   Hydrocodone-acetaminophen Nausea And Vomiting, Other (See Comments)   Vomiting   Latex Rash   Tape Itching, Rash   Tramadol Nausea Only        Medication List        Accurate as of August 30, 2023  7:05 AM. If you have any questions, ask your nurse or doctor.          acetaminophen 500 MG tablet Commonly known as: TYLENOL Take 1,000 mg by mouth every 6 (six) hours as needed for moderate pain.   albuterol 108 (90 Base) MCG/ACT inhaler Commonly known as: VENTOLIN HFA Inhale 2 puffs into the lungs every 6 (six) hours as needed for wheezing or shortness of breath.   amitriptyline 25 MG tablet Commonly known as: ELAVIL 1 tablet at bedtime.   atorvastatin 10 MG tablet Commonly known as: LIPITOR Take 10 mg by mouth at bedtime.   Azelastine HCl 137 MCG/SPRAY Soln SMARTSIG:1-2 Spray(s) Both Nares Twice Daily PRN   azithromycin 250 MG tablet Commonly known as: ZITHROMAX Take 1 tablet (250 mg total) by mouth daily. Take first 2 tablets together, then 1 every day until finished.   budesonide-formoterol 160-4.5  MCG/ACT inhaler Commonly known as: Symbicort Inhale 2 puffs into the lungs in the morning and at bedtime. with spacer and rinse mouth afterwards.   Symbicort 160-4.5 MCG/ACT inhaler Generic drug: budesonide-formoterol INHALE TWO PUFFS BY MOUTH INTO LUNGS in THE morning AND AT bedtime   cyclobenzaprine 10 MG tablet Commonly known as: FLEXERIL cyclobenzaprine 10 mg tablet   EPINEPHrine 0.3 mg/0.3 mL Soaj injection Commonly known as: EPI-PEN See admin instructions.   EPINEPHrine 0.3 mg/0.3 mL Soaj injection Commonly known as: EPI-PEN Inject 0.3 mg into the muscle as needed for anaphylaxis.   Fluocinolone Acetonide Body 0.01 % Oil fluocinolone 0.01 % scalp oil and shower cap  APPLY SMALL AMOUNT TOPICALLY TO THE AFFECTED AREA 2 TO 3 TIMES WEEKLY AS DIRECTED   Fluocinolone Acetonide Scalp 0.01 % Oil   insulin lispro 100 UNIT/ML KwikPen Commonly known as: HumaLOG KwikPen 40 units max daily dose   Insulin Pen Needle 31G X 5 MM Misc 1 Device by Does not apply route 4 (four) times daily.   levocetirizine 5 MG tablet Commonly known as: XYZAL TAKE ONE TABLET BY MOUTH EVERY EVENING   lidocaine 5 % Commonly known as: Lidoderm Place 1 patch onto the skin daily. Remove & Discard patch within 12 hours or as directed by MD   lisinopril 5 MG tablet Commonly known as: ZESTRIL Take 1 tablet (5 mg total) by mouth daily.   loratadine 10 MG tablet Commonly known as: CLARITIN Take 10 mg by mouth daily.   montelukast 10 MG tablet Commonly known as: SINGULAIR Take 10 mg by mouth at bedtime. Reported on 02/16/2016   omeprazole 40 MG capsule Commonly known as: PRILOSEC Take 40 mg by mouth 2 (two) times daily.   ondansetron 4 MG disintegrating tablet Commonly known as: Zofran ODT Take 1 tablet (4 mg total) by mouth every 8 (eight) hours as needed for nausea or vomiting.   ondansetron 4 MG tablet Commonly known as: ZOFRAN Take by mouth.   OneTouch Verio test strip Generic drug:  glucose blood USE  TO Check blood glucose THREE TIMES DAILY   oxyCODONE 5 MG immediate release tablet Commonly known as: Oxy IR/ROXICODONE Take 10 mg by mouth daily.   pioglitazone 45 MG tablet Commonly known as: Actos Take 1 tablet (45 mg total) by mouth daily.   polyethylene glycol-electrolytes 420 g solution Commonly known as: NuLYTELY Take by mouth as directed.   pregabalin 150 MG capsule Commonly known as: LYRICA Take 150 mg by mouth daily.   Ryaltris 161-09 MCG/ACT Susp Generic drug: Olopatadine-Mometasone Place 1-2 sprays into the nose in the morning and at bedtime.   Semaglutide (1 MG/DOSE) 4 MG/3ML Sopn Inject 1 mg as directed once a week.   topiramate 25 MG tablet Commonly known as: TOPAMAX Take by mouth.   topiramate 25 MG tablet Commonly known as: TOPAMAX For next pillpack in March, please dispense Topiramate 25mg : take 2 tablets every night   Xarelto 20 MG Tabs tablet Generic drug: rivaroxaban Take 20 mg by mouth at bedtime.         OBJECTIVE:   Vital Signs: BP 120/80   Pulse (!) 104   Ht 5' (1.524 m)   Wt 166 lb 6.4 oz (75.5 kg)   LMP 12/19/2011   SpO2 99%   BMI 32.50 kg/m    Wt Readings from Last 3 Encounters:  07/18/23 167 lb 12.8 oz (76.1 kg)  02/26/23 178 lb (80.7 kg)  01/09/23 165 lb (74.8 kg)     Exam: General: Pt appears well and is in NAD  Lungs: Clear with good BS bilat   Heart: RRR   Extremities: No pretibial edema.   Neuro: MS is good with appropriate affect, pt is alert and Ox3   DM foot exam:08/15/2023 per podiatry    DATA REVIEWED:  Lab Results  Component Value Date   HGBA1C 7.7 (A) 02/26/2023   HGBA1C 7.8 (A) 08/25/2022   HGBA1C 9.4 (A) 04/21/2022    Latest Reference Range & Units 08/30/23 10:06  Sodium 135 - 145 mEq/L 140  Potassium 3.5 - 5.1 mEq/L 3.6  Chloride 96 - 112 mEq/L 104  CO2 19 - 32 mEq/L 24  Glucose 70 - 99 mg/dL 604 (H)  BUN 6 - 23 mg/dL 14  Creatinine 5.40 - 9.81 mg/dL 1.91  Calcium 8.4 -  47.8 mg/dL 9.7  GFR >29.56 mL/min 56.21 (L)    Latest Reference Range & Units 08/30/23 10:06  Total CHOL/HDL Ratio  3  Cholesterol 0 - 200 mg/dL 213  HDL Cholesterol >08.65 mg/dL 78.46  LDL (calc) 0 - 99 mg/dL 93  NonHDL  962.95  Triglycerides 0.0 - 149.0 mg/dL 28.4  VLDL 0.0 - 13.2 mg/dL 44.0  VITD 10.27 - 253.66 ng/mL 9.44 (L)    Latest Reference Range & Units 08/30/23 09:37 08/30/23 10:06  Hemoglobin A1C 4.0 - 5.6 % 7.6 !   TSH 0.35 - 5.50 uIU/mL  4.57  T4,Free(Direct) 0.60 - 1.60 ng/dL  4.40    Latest Reference Range & Units 08/30/23 10:07  Creatinine,U mg/dL 347.4  Microalb, Ur 0.0 - 1.9 mg/dL 25.9 (H)  MICROALB/CREAT RATIO 0.0 - 30.0 mg/g 3.4  (H): Data is abnormally high  ASSESSMENT / PLAN / RECOMMENDATIONS:   Type 2 Diabetes Mellitus, with improving glycemic control, With neuropathic complications - Most recent A1c of 7.6 %. Goal A1c <7.0%.  -A1c continues to gradually decrease  -Intolerant to Invokana -Glipizide caused diarrhea and abdominal pain -She is tolerating Ozempic, will increase -Will decrease insulin preemptively to prevent hypoglycemia   MEDICATIONS:  Decrease Tresiba 20 units daily  Increase Ozempic 2 mg weekly Continue pioglitazone 45  mg daily Continue correction factor :Humalog( BG-120/25) TIDQAC  EDUCATION / INSTRUCTIONS: BG monitoring instructions: Patient is instructed to check her blood sugars 3 times a day, before meals  I reviewed the Rule of 15 for the treatment of hypoglycemia in detail with the patient. Literature supplied.   2) Diabetic complications:  Eye: Does not have known diabetic retinopathy.  Neuro/ Feet: Does have known diabetic peripheral neuropathy. Renal: Patient does not have known baseline CKD. She is on an ACEI/ARB at present.   3) Dyslipidemia :  -Lipid panel remains acceptable  Medication Continue atorvastatin 10 mg daily    4) Hypotension:   -She was seen by cardiology for hypertension, patient  attributes hypotension due to Ozempic use -Cardiology discontinued lisinopril and amlodipine -I had already discontinued chlorthalidone due to hypokalemia  5) vitamin D deficiency   -We will replenish Medication Ergocalciferol 50,000 international units weekly    F/U in 6 months     Signed electronically by: Lyndle Herrlich, MD  [facility] Endocrinology  [facility] [address] [place] [place], [place] [address] Phone: [phone] FAX: [phone]   CC: Rometta Emery, MD [address]  [place] [address] Phone: [phone]  Fax: [phone]  Return to Endocrinology clinic as below: Future Appointments  Date Time Provider Department Center  08/30/2023  9:30 AM Javon Snee, Konrad Dolores, MD [facility] None  09/27/2023 10:00 AM Van Clines, MD [facility] None  11/07/2023  9:15 AM Louann Sjogren, DPM [facility]  02/13/2024  9:45 AM Eloy End, Lise Auer, DPM [facility]

## 2023-08-30 NOTE — Patient Instructions (Addendum)
-   Increase Ozempic 2 mg once weekly  - Decrease Tresiba 20  units once daily  - Continues  Actos 45 mg daily  -Humalog correctional insulin:  Use the scale below to help guide you before each meal   Blood sugar before meal Number of units to inject  Less than 145 0 unit  146 -  170 1 units  171 -  195 2 units  196 -  220 3 units  221 -  245 4 units  246 -  270 5 units  271 -  295 6 units  296 -  320 7 units  321 -  345 8 units  346 - 370 9 units  371 - 395 10 units  396 - 420 11 units       -HOW TO TREAT LOW BLOOD SUGARS (Blood sugar LESS THAN 70 MG/DL) Please follow the RULE OF 15 for the treatment of hypoglycemia treatment (when your (blood sugars are less than 70 mg/dL)   STEP 1: Take 15 grams of carbohydrates when your blood sugar is low, which includes:  3-4 GLUCOSE TABS  OR 3-4 OZ OF JUICE OR REGULAR SODA OR ONE TUBE OF GLUCOSE GEL    STEP 2: RECHECK blood sugar in 15 MINUTES STEP 3: If your blood sugar is still low at the 15 minute recheck --> then, go back to STEP 1 and treat AGAIN with another 15 grams of carbohydrates.

## 2023-08-31 ENCOUNTER — Encounter: Payer: Self-pay | Admitting: Internal Medicine

## 2023-08-31 DIAGNOSIS — E559 Vitamin D deficiency, unspecified: Secondary | ICD-10-CM | POA: Insufficient documentation

## 2023-08-31 MED ORDER — VITAMIN D (ERGOCALCIFEROL) 1.25 MG (50000 UNIT) PO CAPS: 50000.0000 [IU] | ORAL_CAPSULE | ORAL | 3 refills | Status: DC

## 2023-09-19 ENCOUNTER — Encounter: Payer: Self-pay | Admitting: Internal Medicine

## 2023-09-27 ENCOUNTER — Encounter: Payer: Self-pay | Admitting: Neurology

## 2023-09-27 ENCOUNTER — Ambulatory Visit: Payer: 59 | Admitting: Neurology

## 2023-09-27 VITALS — BP 148/96 | HR 89 | Ht 60.0 in | Wt 168.0 lb

## 2023-09-27 DIAGNOSIS — G43009 Migraine without aura, not intractable, without status migrainosus: Secondary | ICD-10-CM

## 2023-09-27 DIAGNOSIS — G4733 Obstructive sleep apnea (adult) (pediatric): Secondary | ICD-10-CM

## 2023-09-27 DIAGNOSIS — R400 Somnolence: Secondary | ICD-10-CM

## 2023-09-27 DIAGNOSIS — G40009 Localization-related (focal) (partial) idiopathic epilepsy and epileptic syndromes with seizures of localized onset, not intractable, without status epilepticus: Secondary | ICD-10-CM | POA: Diagnosis not present

## 2023-09-27 MED ORDER — TOPIRAMATE 50 MG PO TABS: ORAL_TABLET | ORAL | 3 refills | Status: DC

## 2023-09-27 NOTE — Progress Notes (Signed)
NEUROLOGY FOLLOW UP OFFICE NOTE  Makayla Frazier [id number] 1971/10/11  HISTORY OF PRESENT ILLNESS: I had the pleasure of seeing Makayla Frazier in follow-up in the neurology clinic on 09/27/2023.  The patient was last seen over a year ago for seizures and migraines. She is alone in the office today.  Records and images were personally reviewed where available.  On her last visit, Topiramate was increased to 50mg  at bedtime for episodes of clenching/grinding her teeth at night, waking up with mouth/tongue sore. She denies any nocturnal shaking episodes since 2021, no loss of consciousness since 10/2019. She had been doing well for over a year until a month ago when her prior pharmacy went out of business and she was out of Topiramate for a month, her husband mentioned she was having the "trembles" at night. She has been told by her husband that she stares sometimes, she denies any loss of time, olfactory/gustatory hallucinations, myoclonic jerks. She still grits her teeth and wakes up with a sore jaw, she will be seeing her dentist soon. She notes improvement in migraines, "not even once a month," she does not take any prn medication, she lays down and rests. No nausea/vomiting. When she started Ozempic, she started having hypotension and near syncope, she stopped Ozempic recently and BP is better. She has numbness in both legs, stiffness in her hands. Tylenol Arthritis helps. She gets epidural injections for back pain. She gets over 8 hours of sleep but still feels tired and drowsy in the daytime. She had a sleep study over 10 years ago and was told she has OSA and needed CPAP, but this was put on hold when she started having blood clots. She is on Lyrica for neuropathy, prescribed by her PCP.    History on Initial Assessment 11/20/2019: This is a 52 year old right-handed woman with a history of hypertension, hyperlipidemia, thyroid disease, depression, PE on Xarelto, presenting for evaluation of  seizures. Records from her prior neurologist were reviewed, she was last seen at [facility] in 2016. Per records she started having seizures in 2009, although she recalled passing out spells when younger. Seizures were described as feeling hot then passing at, at times with shaking. She has a bad headache after. She had a cardiology evaluation with a loop recorder reportedly normal. She was started on Topiramate at one point, however with abrupt increase from 25mg  BID to 100mg  BID, she had significant cognitive side effects. She has been on Topiramate 25mg  at bedtime for several years for migraine prophylaxis. She reports her last seizure was in December 2015 when she had tremors in her sleep (not convulsions). There is a normal EEG report from 2015. She continued to report nocturnal events and was started on Levetiracetam 500mg  BID. Since starting Levetiracetam, the nocturnal shaking episodes decreased, and she did not have any daytime episodes until she had an episode of shaking with incontinence 7 months ago, then she was brought to [facility] after an episode of loss of consciousness on 11/08/2019. She recalls feeling a pull on her chest, then woke up on the ground. No tongue bite or incontinence, her husband did not witness any shaking. She was a little out of it after with a slight headache, her right arm and leg were weaker and numb, she was dragging her leg like it was deadweight. It took 1-2 days to feel normal. I personally reviewed MRI brain without contrast which did not show any acute changes, hippocampi symmetric, partially empty sella. Her  wake and sleep EEG was within normal limits. She reported that she had cut down Levetiracetam to 500mg  at bedtime a year ago due to drowsiness and nausea, however since hospital discharge she is back to taking 500mg  BID with nausea medication. Her husband has mentioned episodes where she would stare off or get a little confused, none recently. She has occasional mild  tremors in both hands. She denies any olfactory/gustatory hallucinations. She reports similar transient right-sided weakness after a spell last year. She has been doing PT and was told by PT yesterday to use a walker. She denies any neck/back pain.  She has occasional migraines with good response to low dose Topiramate 25mg  at bedtime. When off medications, she gets very photosensitive. Last migraine was last summer. She takes Maxalt prn. She has been on Pregabalin 100mg  TID for neuropathy over the past year, which helps with numbness/tingling in both feet. In the hospital, she had urinary retention, MRI thoracic and lumbar spine were unremarkable, urinary retention has resolved. She has a lot of constipation. She reports a sleep study where she was told she "stopped breathing 10x/hr but did not start CPAP." Her last sleep study in 2015 reportedly showed OSA. She recalls having a prolonged EEG in the past but with no results due to technical difficulties.   Epilepsy Risk Factors:  Maternal grandfather and some cousins had seizures. Otherwise she had a normal birth and early development.  There is no history of febrile convulsions, CNS infections such as meningitis/encephalitis, significant traumatic brain injury, neurosurgical procedures.  Prior AEDs: Keppra  Diagnostic Data: MRI brain without contrast 10/2019 normal EEG 10/2019 within normal limits   PAST MEDICAL HISTORY: Past Medical History:  Diagnosis Date   Anemia    Arthritis    Left leg   Asthma    BV (bacterial vaginosis) 2012   Complication of anesthesia    Seizures in PACU after surgery 3/15   DM (diabetes mellitus) (HCC)    diagnosed 2009   DVT (deep venous thrombosis) (HCC) 2011   pt had IUD; also 05/2012   Dysfunctional uterine bleeding    Seen by Dr. Pennie Rushing, GYN; pending hysterectomy as of 07/2012   H/O hypercoagulable state    2/2 contraception   Headache(784.0)    migraines   Herpes simplex without mention of  complication 06/2011   HSV-2   HLD (hyperlipidemia)    Hypertension    Labial lesion 2013   Major depression    Hx suicide attempt in 2009, [facility] admissions   Mastodynia    Neuromuscular disorder (HCC)    neuropathy   Neuropathy    Obesity    Oligomenorrhea 2011   PE (pulmonary embolism)    2009 - on oral contraception; multiple   Post - coital bleeding 2012   Seizures (HCC) 2015   last episode May 2015   Sleep apnea    Status post placement of implantable loop recorder    Stroke (HCC)    TIA 2013   Syncopal episodes    unknown etiology   Thyroid disease    hypothyroidism (pt denies)   Vaginal Pap smear, abnormal    Venous insufficiency    Vitamin D deficiency    unknown to pt.    MEDICATIONS: Current Outpatient Medications on File Prior to Visit  Medication Sig Dispense Refill   acetaminophen (TYLENOL) 500 MG tablet Take 1,000 mg by mouth every 6 (six) hours as needed for moderate pain.      albuterol (PROVENTIL HFA;VENTOLIN  HFA) 108 (90 BASE) MCG/ACT inhaler Inhale 2 puffs into the lungs every 6 (six) hours as needed for wheezing or shortness of breath.     amitriptyline (ELAVIL) 25 MG tablet 1 tablet at bedtime.     atorvastatin (LIPITOR) 10 MG tablet Take 10 mg by mouth at bedtime.     Azelastine HCl 137 MCG/SPRAY SOLN SMARTSIG:1-2 Spray(s) Both Nares Twice Daily PRN     budesonide-formoterol (SYMBICORT) 160-4.5 MCG/ACT inhaler Inhale 2 puffs into the lungs in the morning and at bedtime. with spacer and rinse mouth afterwards. 1 each 3   cyclobenzaprine (FLEXERIL) 10 MG tablet cyclobenzaprine 10 mg tablet     EPINEPHrine 0.3 mg/0.3 mL IJ SOAJ injection Inject 0.3 mg into the muscle as needed for anaphylaxis.      EPINEPHrine 0.3 mg/0.3 mL IJ SOAJ injection See admin instructions.     Fluocinolone Acetonide Body 0.01 % OIL fluocinolone 0.01 % scalp oil and shower cap  APPLY SMALL AMOUNT TOPICALLY TO THE AFFECTED AREA 2 TO 3 TIMES WEEKLY AS DIRECTED     Fluocinolone  Acetonide Scalp 0.01 % OIL      glucose blood (ONETOUCH VERIO) test strip USE TO Check blood glucose THREE TIMES DAILY 100 strip 3   insulin degludec (TRESIBA FLEXTOUCH) 100 UNIT/ML FlexTouch Pen Inject 20 Units into the skin daily. 30 mL 3   insulin lispro (HUMALOG KWIKPEN) 100 UNIT/ML KwikPen 40 units max daily dose 30 mL 3   Insulin Pen Needle 32G X 4 MM MISC 1 Device by Does not apply route in the morning, at noon, in the evening, and at bedtime. 400 each 3   levocetirizine (XYZAL) 5 MG tablet TAKE ONE TABLET BY MOUTH EVERY EVENING 90 tablet 1   lidocaine (LIDODERM) 5 % Place 1 patch onto the skin daily. Remove & Discard patch within 12 hours or as directed by MD (Patient taking differently: Place 1 patch onto the skin daily. Remove & Discard patch within 12 hours or as directed by MD. Per pt uses as needed) 30 patch 0   loratadine (CLARITIN) 10 MG tablet Take 10 mg by mouth daily.     montelukast (SINGULAIR) 10 MG tablet Take 10 mg by mouth at bedtime. Reported on 02/16/2016     Olopatadine-Mometasone (RYALTRIS) 665-25 MCG/ACT SUSP Place 1-2 sprays into the nose in the morning and at bedtime. 29 g 5   omeprazole (PRILOSEC) 40 MG capsule Take 40 mg by mouth 2 (two) times daily.     ondansetron (ZOFRAN ODT) 4 MG disintegrating tablet Take 1 tablet (4 mg total) by mouth every 8 (eight) hours as needed for nausea or vomiting. 20 tablet 0   ondansetron (ZOFRAN) 4 MG tablet Take by mouth.     oxyCODONE (OXY IR/ROXICODONE) 5 MG immediate release tablet Take 10 mg by mouth daily.     pioglitazone (ACTOS) 45 MG tablet Take 1 tablet (45 mg total) by mouth daily. 90 tablet 3   pregabalin (LYRICA) 150 MG capsule Take 150 mg by mouth daily.     rivaroxaban (XARELTO) 20 MG TABS tablet Take 20 mg by mouth at bedtime.     SYMBICORT 160-4.5 MCG/ACT inhaler INHALE TWO PUFFS BY MOUTH INTO LUNGS in THE morning AND AT bedtime 10.2 g 5   topiramate (TOPAMAX) 25 MG tablet For next pillpack in March, please dispense  Topiramate 25mg : take 2 tablets every night 180 tablet 3   topiramate (TOPAMAX) 25 MG tablet Take by mouth.     Vitamin  D, Ergocalciferol, (DRISDOL) 1.25 MG (50000 UNIT) CAPS capsule Take 1 capsule (50,000 Units total) by mouth every 7 (seven) days. 12 capsule 3   azithromycin (ZITHROMAX) 250 MG tablet Take 1 tablet (250 mg total) by mouth daily. Take first 2 tablets together, then 1 every day until finished. (Patient not taking: Reported on 09/27/2023) 6 tablet 0   lisinopril (ZESTRIL) 5 MG tablet Take 1 tablet (5 mg total) by mouth daily. (Patient not taking: Reported on 09/27/2023) 90 tablet 3   polyethylene glycol-electrolytes (NULYTELY) 420 g solution Take by mouth as directed. (Patient not taking: Reported on 09/27/2023)     Semaglutide, 2 MG/DOSE, 8 MG/3ML SOPN Inject 2 mg as directed once a week. (Patient not taking: Reported on 09/27/2023) 9 mL 3   No current facility-administered medications on file prior to visit.    ALLERGIES: Allergies  Allergen Reactions   Silver Other (See Comments) and Rash   Canagliflozin Other (See Comments)   Codeine Nausea And Vomiting    Pt takes Percocet without problems    Darvocet [Propoxyphene N-Acetaminophen] Nausea And Vomiting   Glipizide     Diarrhea and abdominal pain    Hydrocodone     Other reaction(s): N/V   Hydrocodone-Acetaminophen Nausea And Vomiting   Lantus [Insulin Glargine]     Burning at injection site    Liraglutide     Other reaction(s): upset stomach   Metformin Diarrhea   Metformin Hcl     Other reaction(s): diarrhea   Oxycodone     Other Reaction(s): GI Intolerance   Propoxyphene     Other reaction(s): nausea   Hydrocodone-Acetaminophen Nausea And Vomiting and Other (See Comments)    Vomiting   Latex Rash   Tape Itching and Rash   Tramadol Nausea Only    FAMILY HISTORY: Family History  Problem Relation Age of Onset   Melanoma Father 36   Diabetes Father    Hypertension Father    Kidney disease Father     Ulcerative colitis Mother 63   Diabetes Mother    Hypertension Mother    Breast cancer Paternal Grandmother    Cancer Paternal Grandmother    Diabetes type II Maternal Grandmother        deceased 58   Diabetes Maternal Grandmother    Pulmonary embolism Paternal Grandfather    Stroke Maternal Grandfather     SOCIAL HISTORY: Social History   Socioeconomic History   Marital status: Married    Spouse name: Not on file   Number of children: Not on file   Years of education: Not on file   Highest education level: Not on file  Occupational History   Occupation: Facilities manager: UNEMPLOYED    Comment: Disabled 2009 due to PE complications  Tobacco Use   Smoking status: Never   Smokeless tobacco: Never  Vaping Use   Vaping status: Never Used  Substance and Sexual Activity   Alcohol use: Not Currently    Comment: once in a while    Drug use: No   Sexual activity: Yes    Partners: Male    Birth control/protection: Surgical  Other Topics Concern   Not on file  Social History Narrative   Lives in [place]   Lives with significant other, Chrissie Noa   Completed 10th grade.   Worker Compensation Case [id number] related to PE   Right handed       Epworth Sleepiness Scale      Total score:  13      --  I have high BP   --I have had a stroke   --I have had insomnia   --I have been told that I stop breathing during sleep   --I wake up to urinate frequently at night   --I wake up with a dry mouth and a sore throat   --I have Diabetes   --I have been told that I snore   --I am overweight or am gaining weight   --I have problems with memory/concentration   --I awake feeling not rested   --I frequently awake with headaches   --I have dozed off or fallen asleep at inappropriate times during the day   Social Determinants of Health   Financial Resource Strain: Not on file  Food Insecurity: No Food Insecurity (04/05/2022)   Received from [facility], [facility]   Hunger Vital  Sign    Worried About Running Out of Food in the Last Year: Never true    Ran Out of Food in the Last Year: Never true  Transportation Needs: Not on file  Physical Activity: Not on file  Stress: Not on file  Social Connections: Unknown (04/21/2022)   Received from [facility], [facility]   Social Network    Social Network: Not on file  Intimate Partner Violence: Unknown (03/29/2022)   Received from [facility], [facility]   HITS    Physically Hurt: Not on file    Insult or Talk Down To: Not on file    Threaten Physical Harm: Not on file    Scream or Curse: Not on file     PHYSICAL EXAM: Vitals:   09/27/23 0941  BP: (!) 162/98  Pulse: 89  SpO2: 98%   General: No acute distress Head:  Normocephalic/atraumatic Skin/Extremities: No rash, no edema Neurological Exam: alert and awake. No aphasia or dysarthria. Fund of knowledge is appropriate.  Attention and concentration are normal.   Cranial nerves: Pupils equal, round. Extraocular movements intact with no nystagmus. Visual fields full.  No facial asymmetry.  Motor: Bulk and tone normal, muscle strength 5/5 throughout with no pronator drift.   Finger to nose testing intact.  Gait narrow-based and steady, mild difficulty with tandem walk.  Romberg negative.   IMPRESSION: This is a 52 yo RH woman with a history of hypertension, hyperlipidemia, thyroid disease, depression, PE on Xarelto, migraines, neuropathy, with recurrent seizures. She reports a history of nocturnal shaking episodes, as well as episodes of loss of consciousness with post-event right-sided weakness suggestive of focal to bilateral tonic-clonic seizures arising from the left hemisphere. MRI brain and routine EEG unremarkable. No episodes of loss of consciousness since 10/2019. Her husband noted nocturnal "trembles" when she ran out of Topiramate. He has also reported staring off. Migraines overall controlled on Topiramate 50mg  at bedtime. She was advised to keep a  calendar of the nocturnal shaking episodes and staring, and if frequent, would do a 48-hour EEG for characterization. She reports fatigue and drowsiness despite over 8 hours of sleep and a history of OSA, sleep study will be ordered. She is aware of [place] driving laws to stop driving after a seizure until 6 months seizure-free. Follow-up in 3-4 months, call for any changes.     Thank you for allowing me to participate in her care.  Please do not hesitate to call for any questions or concerns.    Patrcia Dolly, M.D.   CC: Dr. Mikeal Hawthorne

## 2023-09-27 NOTE — Patient Instructions (Signed)
Good to see you!  A new prescription was sent for Topiramate 50mg : take 1 tablet every night  2. Schedule in-lab sleep study  3. Please ask your husband to keep a calendar of the staring and trembling episodes  4. Follow-up in 3-4 months, call for any changes   Seizure Precautions: 1. If medication has been prescribed for you to prevent seizures, take it exactly as directed.  Do not stop taking the medicine without talking to your doctor first, even if you have not had a seizure in a long time.   2. Avoid activities in which a seizure would cause danger to yourself or to others.  Don't operate dangerous machinery, swim alone, or climb in high or dangerous places, such as on ladders, roofs, or girders.  Do not drive unless your doctor says you may.  3. If you have any warning that you may have a seizure, lay down in a safe place where you can't hurt yourself.    4.  No driving for 6 months from last seizure, as per [facility].   Please refer to the following link on the [employer]ile[employer] of [employer]'s website for more information: http://www.epilepsyfoundation.org/answerplace/Social/driving/drivingu.cfm   5.  Maintain good sleep hygiene. Avoid alcohol  6.  Contact your doctor if you have any problems that may be related to the medicine you are taking.  7.  Call 911 and bring the patient back to the ED if:        A.  The seizure lasts longer than 5 minutes.       B.  The patient doesn't awaken shortly after the seizure  C.  The patient has new problems such as difficulty seeing, speaking or moving  D.  The patient was injured during the seizure  E.  The patient has a temperature over 102 F (39C)  F.  The patient vomited and now is having trouble breathing

## 2023-10-16 ENCOUNTER — Ambulatory Visit (HOSPITAL_BASED_OUTPATIENT_CLINIC_OR_DEPARTMENT_OTHER): Payer: 59 | Admitting: Internal Medicine

## 2023-10-30 ENCOUNTER — Encounter (HOSPITAL_BASED_OUTPATIENT_CLINIC_OR_DEPARTMENT_OTHER): Payer: 59 | Admitting: Internal Medicine

## 2023-10-30 ENCOUNTER — Encounter (HOSPITAL_BASED_OUTPATIENT_CLINIC_OR_DEPARTMENT_OTHER): Payer: Self-pay

## 2023-11-07 ENCOUNTER — Ambulatory Visit: Payer: 59 | Admitting: Podiatry

## 2023-11-07 DIAGNOSIS — Z91199 Patient's noncompliance with other medical treatment and regimen due to unspecified reason: Secondary | ICD-10-CM

## 2023-11-07 NOTE — Progress Notes (Signed)
No show

## 2023-11-16 ENCOUNTER — Other Ambulatory Visit: Payer: Self-pay

## 2023-11-16 MED ORDER — ATORVASTATIN CALCIUM 10 MG PO TABS: 10.0000 mg | ORAL_TABLET | Freq: Every day | ORAL | 2 refills | Status: AC

## 2023-11-16 MED ORDER — PIOGLITAZONE HCL 45 MG PO TABS: 45.0000 mg | ORAL_TABLET | Freq: Every day | ORAL | 3 refills | Status: AC

## 2023-11-16 MED ORDER — TRESIBA FLEXTOUCH 100 UNIT/ML ~~LOC~~ SOPN: 20.0000 [IU] | PEN_INJECTOR | Freq: Every day | SUBCUTANEOUS | 3 refills | Status: AC

## 2023-12-18 ENCOUNTER — Other Ambulatory Visit: Payer: Self-pay | Admitting: Obstetrics and Gynecology

## 2023-12-18 DIAGNOSIS — Z1231 Encounter for screening mammogram for malignant neoplasm of breast: Secondary | ICD-10-CM

## 2023-12-28 ENCOUNTER — Ambulatory Visit (INDEPENDENT_AMBULATORY_CARE_PROVIDER_SITE_OTHER): Payer: 59 | Admitting: Neurology

## 2023-12-28 ENCOUNTER — Encounter: Payer: Self-pay | Admitting: Neurology

## 2023-12-28 VITALS — BP 120/80 | HR 88 | Ht 60.0 in | Wt 179.8 lb

## 2023-12-28 DIAGNOSIS — G43009 Migraine without aura, not intractable, without status migrainosus: Secondary | ICD-10-CM | POA: Diagnosis not present

## 2023-12-28 DIAGNOSIS — G40009 Localization-related (focal) (partial) idiopathic epilepsy and epileptic syndromes with seizures of localized onset, not intractable, without status epilepticus: Secondary | ICD-10-CM | POA: Diagnosis not present

## 2023-12-28 MED ORDER — TOPIRAMATE 100 MG PO TABS: ORAL_TABLET | ORAL | 3 refills | Status: DC
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
## 2024-01-02 ENCOUNTER — Telehealth: Payer: Self-pay | Admitting: Podiatry

## 2024-01-02 ENCOUNTER — Ambulatory Visit: Payer: 59

## 2024-01-02 NOTE — Telephone Encounter (Signed)
Called pt per Dr Eloy End to see if we could get her in sooner. I offered her appts in February and she is not able to make it. She stated she would just keep appt on march 5th.  I told her to call if anything changes and we would see if we could get her in.

## 2024-01-07 ENCOUNTER — Ambulatory Visit: Payer: 59 | Admitting: Internal Medicine

## 2024-01-16 ENCOUNTER — Ambulatory Visit: Payer: 59

## 2024-01-16 ENCOUNTER — Ambulatory Visit
Admission: RE | Admit: 2024-01-16 | Discharge: 2024-01-16 | Disposition: A | Discharge status: Home / Self Care | Payer: Medicare HMO | Source: Ambulatory Visit | Attending: Obstetrics and Gynecology | Admitting: Obstetrics and Gynecology

## 2024-01-16 DIAGNOSIS — Z1231 Encounter for screening mammogram for malignant neoplasm of breast: Secondary | ICD-10-CM

## 2024-01-31 DIAGNOSIS — L650 Telogen effluvium: Secondary | ICD-10-CM | POA: Insufficient documentation

## 2024-01-31 DIAGNOSIS — L6681 Central centrifugal cicatricial alopecia: Secondary | ICD-10-CM | POA: Insufficient documentation

## 2024-02-13 ENCOUNTER — Ambulatory Visit: Payer: Medicare HMO | Admitting: Podiatry

## 2024-02-13 ENCOUNTER — Encounter: Payer: Self-pay | Admitting: Podiatry

## 2024-02-13 VITALS — Ht 60.0 in | Wt 179.8 lb

## 2024-02-13 DIAGNOSIS — E1165 Type 2 diabetes mellitus with hyperglycemia: Secondary | ICD-10-CM

## 2024-02-13 DIAGNOSIS — M79674 Pain in right toe(s): Secondary | ICD-10-CM

## 2024-02-13 DIAGNOSIS — B351 Tinea unguium: Secondary | ICD-10-CM

## 2024-02-13 DIAGNOSIS — M79675 Pain in left toe(s): Secondary | ICD-10-CM

## 2024-02-13 DIAGNOSIS — J309 Allergic rhinitis, unspecified: Secondary | ICD-10-CM | POA: Insufficient documentation

## 2024-02-13 NOTE — Progress Notes (Signed)
 Subjective:  Patient ID: Makayla Frazier, female    DOB: 1971-01-23,  MRN: [id number]  53 y.o. female presents preventative diabetic foot care and painful, elongated thickened toenails x 10 which are symptomatic when wearing enclosed shoe gear. This interferes with his/her daily activities.  No chief complaint on file.  New problem(s): None   PCP is Rometta Emery, MD.  Allergies  Allergen Reactions   Silver Other (See Comments) and Rash   Canagliflozin Other (See Comments)   Codeine Nausea And Vomiting    Pt takes Percocet without problems    Darvocet [Propoxyphene N-Acetaminophen] Nausea And Vomiting   Glipizide     Diarrhea and abdominal pain    Hydrocodone     Other reaction(s): N/V   Hydrocodone-Acetaminophen Nausea And Vomiting   Lantus [Insulin Glargine]     Burning at injection site    Liraglutide     Other reaction(s): upset stomach   Metformin Diarrhea   Metformin Hcl     Other reaction(s): diarrhea   Oxycodone     Other Reaction(s): GI Intolerance   Propoxyphene     Other reaction(s): nausea   Hydrocodone-Acetaminophen Nausea And Vomiting and Other (See Comments)    Vomiting   Latex Rash   Tape Itching and Rash   Tramadol Nausea Only    Review of Systems: Negative except as noted in the HPI.   Objective:  Makayla Frazier is a pleasant 52 y.o. female WD, WN in NAD. AAO x 3.  Vascular Examination: Vascular status intact b/l with palpable pedal pulses. CFT immediate b/l. Pedal hair present. No edema. No pain with calf compression b/l. Skin temperature gradient WNL b/l. No varicosities noted. No cyanosis or clubbing noted.  Neurological Examination: Pt has subjective symptoms of neuropathy. Sensation grossly intact b/l with 10 gram monofilament. Vibratory sensation intact b/l.  Dermatological Examination: Pedal skin with normal turgor, texture and tone b/l. No open wounds nor interdigital macerations noted. Toenails 1-5 b/l thick, discolored,  elongated with subungual debris and pain on dorsal palpation. No hyperkeratotic lesions noted b/l.   Musculoskeletal Examination: Muscle strength 5/5 to b/l LE.  No pain, crepitus noted b/l. HAV with bunion deformity noted b/l LE.  Radiographs: None  Last A1c:      Latest Ref Rng & Units 08/30/2023    9:37 AM 02/26/2023   11:39 AM  Hemoglobin A1C  Hemoglobin-A1c 4.0 - 5.6 % 7.6  7.7      Assessment:   1. Pain due to onychomycosis of toenails of both feet   2. Type 2 diabetes mellitus with hyperglycemia, without long-term current use of insulin (HCC)    Plan:  Patient was evaluated and treated. All patient's and/or POA's questions/concerns addressed on today's visit. Toenails 1-5 debrided in length and girth without incident. Continue foot and shoe inspections daily. Monitor blood glucose per PCP/Endocrinologist's recommendations. Continue soft, supportive shoe gear daily. Report any pedal injuries to medical professional. Call office if there are any questions/concerns. -Patient/POA to call should there be question/concern in the interim.  Return in about 3 months (around 05/15/2024).  Freddie Breech, DPM      [place] LOCATION: [address] [place], [place] [address]  Office [phone]   [facility] LOCATION: [address] [place], [place] [address] Office [phone]

## 2024-02-15 ENCOUNTER — Ambulatory Visit: Payer: 59 | Admitting: Internal Medicine

## 2024-02-17 ENCOUNTER — Encounter: Payer: Self-pay | Admitting: Podiatry

## 2024-05-09 ENCOUNTER — Ambulatory Visit: Payer: 59 | Admitting: Neurology

## 2024-05-21 ENCOUNTER — Encounter: Payer: Self-pay | Admitting: Podiatry

## 2024-05-21 ENCOUNTER — Ambulatory Visit (INDEPENDENT_AMBULATORY_CARE_PROVIDER_SITE_OTHER): Admitting: Podiatry

## 2024-05-21 DIAGNOSIS — B351 Tinea unguium: Secondary | ICD-10-CM | POA: Diagnosis not present

## 2024-05-21 DIAGNOSIS — M79674 Pain in right toe(s): Secondary | ICD-10-CM | POA: Diagnosis not present

## 2024-05-21 DIAGNOSIS — M79675 Pain in left toe(s): Secondary | ICD-10-CM | POA: Diagnosis not present

## 2024-05-21 DIAGNOSIS — E1165 Type 2 diabetes mellitus with hyperglycemia: Secondary | ICD-10-CM | POA: Diagnosis not present

## 2024-05-27 NOTE — Progress Notes (Signed)
 Subjective:  Patient ID: Makayla Frazier, female    DOB: 23-Sep-1971,  MRN: [id number]  Makayla Frazier presents to clinic today for preventative diabetic foot care and painful, elongated thickened toenails x 10 which are symptomatic when wearing enclosed shoe gear. This interferes with his/her daily activities.  Chief Complaint  Patient presents with   [facility]    Rm16/ DFC/A1c7.6/Last vist May 2025/blood thinner   New problem(s): None.   PCP is Davida Espy, MD.  Allergies  Allergen Reactions   Silver Other (See Comments) and Rash   Canagliflozin  Other (See Comments)   Codeine Nausea And Vomiting    Pt takes Percocet without problems    Darvocet [Propoxyphene N-Acetaminophen ] Nausea And Vomiting   Glipizide      Diarrhea and abdominal pain    Hydrocodone      Other reaction(s): N/V   Hydrocodone -Acetaminophen  Nausea And Vomiting   Lantus  [Insulin  Glargine]     Burning at injection site    Liraglutide     Other reaction(s): upset stomach   Metformin  Diarrhea   Metformin  Hcl     Other reaction(s): diarrhea   Oxycodone      Other Reaction(s): GI Intolerance   Propoxyphene     Other reaction(s): nausea   Hydrocodone -Acetaminophen  Nausea And Vomiting and Other (See Comments)    Vomiting   Latex Rash   Tape Itching and Rash   Tramadol  Nausea Only    Review of Systems: Negative except as noted in the HPI.  Objective: No changes noted in today's physical examination. There were no vitals filed for this visit. Makayla Frazier is a pleasant 53 y.o. female WD, WN in NAD. AAO x 3.  Vascular Examination: Capillary refill time immediate b/l. Palpable pedal pulses. Pedal hair present b/l. No pain with calf compression b/l. Skin temperature gradient WNL b/l. No cyanosis or clubbing b/l. No ischemia or gangrene noted b/l. No edema noted b/l LE.  Neurological Examination: Sensation grossly intact b/l with 10 gram monofilament. Vibratory sensation intact b/l. Pt has subjective  symptoms of neuropathy.  Dermatological Examination: Pedal skin with normal turgor, texture and tone b/l.  No open wounds. No interdigital macerations.   Toenails 1-5 b/l thick, discolored, elongated with subungual debris and pain on dorsal palpation.   No corns, calluses nor porokeratotic lesions noted.  Musculoskeletal Examination: Muscle strength 5/5 to all lower extremity muscle groups bilaterally. HAV with bunion deformity noted b/l LE.Aaron Aas No pain, crepitus or joint limitation noted with ROM b/l LE.  Patient ambulates independently without assistive aids.  Radiographs: None  Last A1c:      Latest Ref Rng & Units 08/30/2023    9:37 AM  Hemoglobin A1C  Hemoglobin-A1c 4.0 - 5.6 % 7.6    Assessment/Plan: 1. Pain due to onychomycosis of toenails of both feet   2. Type 2 diabetes mellitus with hyperglycemia, without long-term current use of insulin  [facility])   Consent given for treatment. Patient examined. All patient's and/or POA's questions/concerns addressed on today's visit. Mycotic toenails 1-5 debrided in length and girth without incident. Continue foot and shoe inspections daily. Monitor blood glucose per PCP/Endocrinologist's recommendations.Continue soft, supportive shoe gear daily. Report any pedal injuries to medical professional. Call office if there are any quesitons/concerns. -Patient/POA to call should there be question/concern in the interim.   Return in about 3 months (around 08/21/2024).  Makayla Frazier, DPM      [place] LOCATION: [address]  [place], [place] [address]                   Office [phone]   [facility] LOCATION: [address] [place], [place] [address] Office [phone]

## 2024-07-27 ENCOUNTER — Other Ambulatory Visit: Payer: Self-pay

## 2024-07-27 ENCOUNTER — Emergency Department (HOSPITAL_COMMUNITY)

## 2024-07-27 ENCOUNTER — Encounter (HOSPITAL_COMMUNITY): Payer: Self-pay | Admitting: *Deleted

## 2024-07-27 ENCOUNTER — Emergency Department (HOSPITAL_COMMUNITY)
Admission: EM | Admit: 2024-07-27 | Discharge: 2024-07-27 | Disposition: A | Discharge status: Home / Self Care | Attending: Emergency Medicine | Admitting: Emergency Medicine

## 2024-07-27 DIAGNOSIS — Z91040 Latex allergy status: Secondary | ICD-10-CM | POA: Diagnosis not present

## 2024-07-27 DIAGNOSIS — R569 Unspecified convulsions: Secondary | ICD-10-CM | POA: Diagnosis present

## 2024-07-27 DIAGNOSIS — Z7901 Long term (current) use of anticoagulants: Secondary | ICD-10-CM | POA: Insufficient documentation

## 2024-07-27 DIAGNOSIS — Z794 Long term (current) use of insulin: Secondary | ICD-10-CM | POA: Diagnosis not present

## 2024-07-27 LAB — CBC
HCT: 36.4 % (ref 36.0–46.0)
Hemoglobin: 11.4 g/dL — ABNORMAL LOW (ref 12.0–15.0)
MCH: 25.8 pg — ABNORMAL LOW (ref 26.0–34.0)
MCHC: 31.3 g/dL (ref 30.0–36.0)
MCV: 82.4 fL (ref 80.0–100.0)
Platelets: 298 K/uL (ref 150–400)
RBC: 4.42 MIL/uL (ref 3.87–5.11)
RDW: 15.5 % (ref 11.5–15.5)
WBC: 6.7 K/uL (ref 4.0–10.5)
nRBC: 0 % (ref 0.0–0.2)

## 2024-07-27 LAB — BASIC METABOLIC PANEL WITH GFR
Anion gap: 10 (ref 5–15)
BUN: 15 mg/dL (ref 6–20)
CO2: 23 mmol/L (ref 22–32)
Calcium: 8.7 mg/dL — ABNORMAL LOW (ref 8.9–10.3)
Chloride: 104 mmol/L (ref 98–111)
Creatinine, Ser: 0.94 mg/dL (ref 0.44–1.00)
GFR, Estimated: >60 mL/min (ref >60)
Glucose, Bld: 199 mg/dL — ABNORMAL HIGH (ref 70–99)
Potassium: 4.4 mmol/L (ref 3.5–5.1)
Sodium: 137 mmol/L (ref 135–145)

## 2024-07-27 LAB — URINALYSIS, W/ REFLEX TO CULTURE (INFECTION SUSPECTED)
Bilirubin Urine: NEGATIVE
Glucose, UA: 50 mg/dL — AB
Hgb urine dipstick: NEGATIVE
Ketones, ur: NEGATIVE mg/dL
Leukocytes,Ua: NEGATIVE
Nitrite: NEGATIVE
Protein, ur: NEGATIVE mg/dL
Specific Gravity, Urine: 1.011 (ref 1.005–1.030)
pH: 7 (ref 5.0–8.0)

## 2024-07-27 LAB — TROPONIN I (HIGH SENSITIVITY)
Troponin I (High Sensitivity): 4 ng/L (ref <18)
Troponin I (High Sensitivity): 4 ng/L (ref <18)

## 2024-07-27 NOTE — ED Provider Notes (Signed)
 [place] EMERGENCY DEPARTMENT AT [facility] Provider Note   CSN: [id number] Arrival date & time: 07/27/24  1114     Patient presents with: Seizures and Chest Pain   Makayla Frazier is a 53 y.o. female.   53 year old female with prior medical history as detailed below presents for evaluation.  Patient with known history of seizure.  Per husband, patient was at home.  She had a brief seizure that is typical of her breakthrough seizures.  She did not fall.  The husband was able to catch her as she started to have a seizure.  Upon arrival to the ED the patient is comfortable.  She does not appear postictal.  She has no specific complaints however.  Husband reports that the patient did complain of some vague chest discomfort immediately after the seizure.  This is resolved.  The history is provided by the patient and medical records.       Prior to Admission medications   Medication Sig Start Date End Date Taking? Authorizing Provider  acetaminophen  (TYLENOL ) 500 MG tablet Take 1,000 mg by mouth every 6 (six) hours as needed for moderate pain.     [provider]  albuterol  (PROVENTIL  HFA;VENTOLIN  HFA) 108 (90 BASE) MCG/ACT inhaler Inhale 2 puffs into the lungs every 6 (six) hours as needed for wheezing or shortness of breath.    [provider]  atorvastatin  (LIPITOR) 10 MG tablet Take 1 tablet (10 mg total) by mouth at bedtime. 11/16/23   Shamleffer, Ibtehal Jaralla, MD  Azelastine  HCl 137 MCG/SPRAY SOLN SMARTSIG:1-2 Spray(s) Both Nares Twice Daily PRN 04/18/22   [provider]  budesonide -formoterol  (SYMBICORT ) 160-4.5 MCG/ACT inhaler Inhale 2 puffs into the lungs in the morning and at bedtime. with spacer and rinse mouth afterwards. 11/07/21   Luke Orlan CHRISTELLA, DO  cyclobenzaprine (FLEXERIL) 10 MG tablet cyclobenzaprine 10 mg tablet    [provider]  EPINEPHrine  0.3 mg/0.3 mL IJ SOAJ injection Inject 0.3 mg into the muscle as needed for  anaphylaxis.  04/30/18   [provider]  EPINEPHrine  0.3 mg/0.3 mL IJ SOAJ injection See admin instructions.    [provider]  Fluocinolone Acetonide Body 0.01 % OIL fluocinolone 0.01 % scalp oil and shower cap  APPLY SMALL AMOUNT TOPICALLY TO THE AFFECTED AREA 2 TO 3 TIMES WEEKLY AS DIRECTED    [provider]  Fluocinolone Acetonide Scalp 0.01 % OIL  03/08/21   [provider]  glucose blood (ONETOUCH VERIO) test strip USE TO Check blood glucose THREE TIMES DAILY 05/01/23   Shamleffer, Ibtehal Jaralla, MD  insulin  degludec (TRESIBA  FLEXTOUCH) 100 UNIT/ML FlexTouch Pen Inject 20 Units into the skin daily. 11/16/23   Shamleffer, Ibtehal Jaralla, MD  insulin  lispro (HUMALOG  KWIKPEN) 100 UNIT/ML KwikPen 40 units max daily dose 09/04/22   Shamleffer, Ibtehal Jaralla, MD  Insulin  Pen Needle 32G X 4 MM MISC 1 Device by Does not apply route in the morning, at noon, in the evening, and at bedtime. 08/30/23   Shamleffer, Ibtehal Jaralla, MD  levocetirizine (XYZAL ) 5 MG tablet TAKE ONE TABLET BY MOUTH EVERY EVENING 11/06/22   Luke Orlan CHRISTELLA, DO  lidocaine  (LIDODERM ) 5 % Place 1 patch onto the skin daily. Remove & Discard patch within 12 hours or as directed by MD 01/09/23   Prosperi, Sherlean H, PA-C  lisinopril  (ZESTRIL ) 5 MG tablet Take 1 tablet (5 mg total) by mouth daily. 08/15/23   Shamleffer, Ibtehal Jaralla, MD  loratadine  (CLARITIN ) 10 MG  tablet Take 10 mg by mouth daily. 05/12/22   [provider]  montelukast  (SINGULAIR ) 10 MG tablet Take 10 mg by mouth at bedtime. Reported on 02/16/2016 04/13/15   [provider]  Olopatadine-Mometasone  (RYALTRIS ) [phone] MCG/ACT SUSP Place 1-2 sprays into the nose in the morning and at bedtime. 05/12/22   Luke Orlan HERO, DO  omeprazole (PRILOSEC) 40 MG capsule Take 40 mg by mouth 2 (two) times daily. 05/12/22   [provider]  ondansetron  (ZOFRAN  ODT) 4 MG disintegrating tablet Take 1 tablet (4 mg total) by mouth every 8  (eight) hours as needed for nausea or vomiting. 07/09/20   Theadore Ozell HERO, MD  ondansetron  (ZOFRAN ) 4 MG tablet Take by mouth. 10/20/21   [provider]  oxyCODONE  (OXY IR/ROXICODONE ) 5 MG immediate release tablet Take 10 mg by mouth daily. 02/01/22   [provider]  pioglitazone  (ACTOS ) 45 MG tablet Take 1 tablet (45 mg total) by mouth daily. 11/16/23   Shamleffer, Ibtehal Jaralla, MD  polyethylene glycol-electrolytes (NULYTELY ) 420 g solution Take by mouth as directed. 05/12/22   [provider]  pregabalin  (LYRICA ) 150 MG capsule Take 150 mg by mouth daily. 02/15/22   [provider]  rivaroxaban  (XARELTO ) 20 MG TABS tablet Take 20 mg by mouth at bedtime.    [provider]  Semaglutide , 2 MG/DOSE, 8 MG/3ML SOPN Inject 2 mg as directed once a week. 08/30/23   Shamleffer, Ibtehal Jaralla, MD  SYMBICORT  160-4.5 MCG/ACT inhaler INHALE TWO PUFFS BY MOUTH INTO LUNGS in THE morning AND AT bedtime 09/14/22   Luke Orlan HERO, DO  topiramate  (TOPAMAX ) 100 MG tablet Take 1 tablet every night 12/28/23   Georjean Darice HERO, MD  Vitamin D , Ergocalciferol , (DRISDOL ) 1.25 MG (50000 UNIT) CAPS capsule Take 1 capsule (50,000 Units total) by mouth every 7 (seven) days. 08/31/23   Shamleffer, Ibtehal Jaralla, MD    Allergies: Silver, Canagliflozin , Codeine, Darvocet [propoxyphene n-acetaminophen ], Glipizide , Hydrocodone , Hydrocodone -acetaminophen , Lantus  [insulin  glargine], Liraglutide, Metformin , Metformin  hcl, Oxycodone , Propoxyphene, Hydrocodone -acetaminophen , Latex, Tape, and Tramadol     Review of Systems  All other systems reviewed and are negative.   Updated Vital Signs BP (!) 151/79 (BP Location: Right Arm)   Pulse 98   Temp 98.2 F (36.8 C) (Oral)   Resp 16   Ht 5' (1.524 m)   Wt 81.6 kg   LMP 12/19/2011   SpO2 100%   BMI 35.13 kg/m   Physical Exam Vitals and nursing note reviewed.  Constitutional:      General: She is not in acute distress.    Appearance:  Normal appearance. She is well-developed.  HENT:     Head: Normocephalic and atraumatic.  Eyes:     Conjunctiva/sclera: Conjunctivae normal.     Pupils: Pupils are equal, round, and reactive to light.  Cardiovascular:     Rate and Rhythm: Normal rate and regular rhythm.     Heart sounds: Normal heart sounds.  Pulmonary:     Effort: Pulmonary effort is normal. No respiratory distress.     Breath sounds: Normal breath sounds.  Abdominal:     General: There is no distension.     Palpations: Abdomen is soft.     Tenderness: There is no abdominal tenderness.  Musculoskeletal:        General: No deformity. Normal range of motion.     Cervical back: Normal range of motion and neck supple.  Skin:    General: Skin is warm and dry.  Neurological:  General: No focal deficit present.     Mental Status: She is alert and oriented to person, place, and time.     (all labs ordered are listed, but only abnormal results are displayed) Labs Reviewed  BASIC METABOLIC PANEL WITH GFR  CBC  URINALYSIS, W/ REFLEX TO CULTURE (INFECTION SUSPECTED)  TROPONIN I (HIGH SENSITIVITY)    EKG: EKG Interpretation Date/Time:  Sunday July 27 2024 11:30:33 EDT Ventricular Rate:  99 PR Interval:  143 QRS Duration:  71 QT Interval:  356 QTC Calculation: 457 R Axis:   28  Text Interpretation: Sinus rhythm Low voltage, precordial leads Confirmed by Laurice Coy [phone]) on 07/27/2024 11:44:48 AM  Radiology: No results found.   Procedures   Medications Ordered in the ED - No data to display                                  Medical Decision Making Patient with likely breakthrough seizure.  Patient without significant symptoms on evaluation.  Screening labs and imaging obtained are without significant abnormality.  On reevaluation the patient feels improved.  She desires discharge home.  Both she and her husband understand need for close outpatient follow-up.  Strict return precautions given  understood.  Amount and/or Complexity of Data Reviewed Labs: ordered. Radiology: ordered.        Final diagnoses:  Seizure [facility])    ED Discharge Orders     None          Laurice Coy BROCKS, MD 07/27/24 [phone]

## 2024-07-27 NOTE — ED Triage Notes (Addendum)
 Pt here via [facility] from home for witnessed seizure.  Hx of.    Husband states that 1 hour before her seizure she began c/o chest pain and slugishness.  When EMS arrived pt was post ictal.  When pt came too, she began c/o chest pain and increased pain on inspiration.  Was seen by pcp to r/o blood clot on Friday, but is still waiting for results.  Bp 150/120 Cbg 185 Hr 110 Sats 96% on RA - sob on exertion.  Given 324 asa.  Given 1 sl nitroglycerine with not relief.

## 2024-07-27 NOTE — Discharge Instructions (Addendum)
 Return for any problem.  ?

## 2024-09-10 ENCOUNTER — Ambulatory Visit: Admitting: Podiatry

## 2024-12-22 ENCOUNTER — Encounter: Payer: Self-pay | Admitting: Neurology

## 2024-12-22 ENCOUNTER — Telehealth: Admitting: Neurology

## 2024-12-22 VITALS — Ht 60.0 in | Wt 176.0 lb

## 2024-12-22 DIAGNOSIS — G43009 Migraine without aura, not intractable, without status migrainosus: Secondary | ICD-10-CM

## 2024-12-22 DIAGNOSIS — G40009 Localization-related (focal) (partial) idiopathic epilepsy and epileptic syndromes with seizures of localized onset, not intractable, without status epilepticus: Secondary | ICD-10-CM

## 2024-12-22 MED ORDER — TOPIRAMATE 100 MG PO TABS: ORAL_TABLET | ORAL | 4 refills | Status: AC
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
## 2024-12-22 NOTE — Patient Instructions (Signed)
 Good to see you doing well. Continue Topiramate  100mg  every night, refills sent. Follow-up in 1 year, call for any changes.    Seizure Precautions: 1. If medication has been prescribed for you to prevent seizures, take it exactly as directed.  Do not stop taking the medicine without talking to your doctor first, even if you have not had a seizure in a long time.   2. Avoid activities in which a seizure would cause danger to yourself or to others.  Don't operate dangerous machinery, swim alone, or climb in high or dangerous places, such as on ladders, roofs, or girders.  Do not drive unless your doctor says you may.  3. If you have any warning that you may have a seizure, lay down in a safe place where you can't hurt yourself.    4.  No driving for 6 months from last seizure, as per [place]  state law.   Please refer to the following link on the [employer]ile[employer] of [employer]'s website for more information: http://www.epilepsyfoundation.org/answerplace/Social/driving/drivingu.cfm   5.  Maintain good sleep hygiene.  6.  Contact your doctor if you have any problems that may be related to the medicine you are taking.  7.  Call 911 and bring the patient back to the ED if:        A.  The seizure lasts longer than 5 minutes.       B.  The patient doesn't awaken shortly after the seizure  C.  The patient has new problems such as difficulty seeing, speaking or moving  D.  The patient was injured during the seizure  E.  The patient has a temperature over 102 F (39C)  F.  The patient vomited and now is having trouble breathing

## 2024-12-23 ENCOUNTER — Ambulatory Visit: Admitting: Podiatry

## 2024-12-23 ENCOUNTER — Encounter: Payer: Self-pay | Admitting: Podiatry

## 2024-12-23 DIAGNOSIS — M79675 Pain in left toe(s): Secondary | ICD-10-CM

## 2024-12-23 DIAGNOSIS — Z0189 Encounter for other specified special examinations: Secondary | ICD-10-CM

## 2024-12-23 DIAGNOSIS — M79674 Pain in right toe(s): Secondary | ICD-10-CM | POA: Diagnosis not present

## 2024-12-23 DIAGNOSIS — E119 Type 2 diabetes mellitus without complications: Secondary | ICD-10-CM | POA: Diagnosis not present

## 2024-12-23 DIAGNOSIS — M2011 Hallux valgus (acquired), right foot: Secondary | ICD-10-CM

## 2024-12-23 DIAGNOSIS — E1142 Type 2 diabetes mellitus with diabetic polyneuropathy: Secondary | ICD-10-CM | POA: Diagnosis not present

## 2024-12-23 DIAGNOSIS — B351 Tinea unguium: Secondary | ICD-10-CM | POA: Diagnosis not present

## 2024-12-23 DIAGNOSIS — M2012 Hallux valgus (acquired), left foot: Secondary | ICD-10-CM | POA: Diagnosis not present

## 2024-12-29 NOTE — Progress Notes (Signed)
 "  Subjective:  Patient ID: Makayla Frazier, female    DOB: 13-Dec-1970,  MRN: [id number]  Makayla Frazier presents to clinic today for for annual diabetic foot examination and preventative diabetic foot care for painful thick toenails that are difficult to trim. Pain interferes with ambulation. Aggravating factors include wearing enclosed shoe gear. Pain is relieved with periodic professional debridement.  Chief Complaint  Patient presents with   Nail Problem    Trim my toes.  My big toenail, both, is cracking and splitting.  It's loose and it seems like a nail is growing up under it.  The right one is the worse.   Saw Dr. Sim - last week A1c - 14.9   New problem(s): None.   PCP is Makayla Emery CROME, MD.  Allergies[1]  Review of Systems: Negative except as noted in the HPI.  Objective: No changes noted in today's physical examination. There were no vitals filed for this visit. Makayla Frazier is a pleasant 54 y.o. female WD, WN in NAD. AAO x 3.  Vascular Examination: Capillary refill time immediate b/l. Vascular status intact b/l with palpable pedal pulses. Pedal hair present b/l. No pain with calf compression b/l. Skin temperature gradient WNL b/l. No cyanosis or clubbing b/l. No ischemia or gangrene noted b/l.   Neurological Examination: Protective sensation intact with 10 gram monofilament left lower extremity. Protective sensation decreased with 10 gram monofilament right lower extremity.  Dermatological Examination: Pedal skin with normal turgor, texture and tone b/l.  No open wounds. No interdigital macerations.   Toenails 1-5 b/l thick, discolored, elongated with subungual debris and pain on dorsal palpation.   No hyperkeratotic nor porokeratotic lesions.  Musculoskeletal Examination: Muscle strength 5/5 to all lower extremity muscle groups bilaterally. HAV with bunion deformity noted b/l LE.[name] No pain, crepitus or joint limitation noted with ROM b/l LE.  Patient ambulates  independently without assistive aids.  Radiographs: None  Assessment/Plan: 1. Pain due to onychomycosis of toenails of both feet   2. Hallux valgus, acquired, bilateral   3. Diabetic peripheral neuropathy associated with type 2 diabetes mellitus (HCC)   4. Encounter for diabetic foot exam (HCC)   Diabetic foot examination performed today. All patient's and/or POA's questions/concerns addressed on today's visit. Toenails 1-5 b/l debrided in length and girth without incident. Start Vick's Vapor Rub to toenails once daily. Continue foot and shoe inspections daily. Monitor blood glucose per PCP/Endocrinologist's recommendations. Continue soft, supportive shoe gear daily. Report any pedal injuries to medical professional. Call office if there are any questions/concerns. -Patient/POA to call should there be question/concern in the interim.   Return in about 3 months (around 03/23/2025).  Makayla Frazier, DPM       LOCATION: [address] [place] [address]                   Office [phone]   [facility] LOCATION: [address] [place], [place] [address] Office [phone]     [1]  Allergies Allergen Reactions   Silver Other (See Comments) and Rash   Canagliflozin  Other (See Comments)   Codeine Nausea And Vomiting    Pt takes Percocet without problems    Darvocet [  Propoxyphene N-Acetaminophen ] Nausea And Vomiting   Glipizide      Diarrhea and abdominal pain    Hydrocodone      Other reaction(s): N/V   Hydrocodone -Acetaminophen  Nausea And Vomiting   Lantus  [Insulin  Glargine]     Burning at injection site    Liraglutide     Other reaction(s): upset stomach   Metformin  Diarrhea   Metformin  Hcl     Other reaction(s): diarrhea   Oxycodone      Other Reaction(s): GI Intolerance   Ozempic  (0.25 Or 0.5 Mg-Dose) [Semaglutide (0.25 Or 0.5mg -Dos)] Other (See Comments)    Took my hair out   Propoxyphene      Other reaction(s): nausea   Hydrocodone -Acetaminophen  Nausea And Vomiting and Other (See Comments)    Vomiting   Latex Rash   Tape Itching and Rash   Tramadol  Nausea Only   "

## 2025-03-31 ENCOUNTER — Ambulatory Visit: Admitting: Podiatry

## 2025-12-23 ENCOUNTER — Ambulatory Visit: Payer: Self-pay | Admitting: Neurology
